# Patient Record
Sex: Female | Born: 1950 | Race: White | Hispanic: No | Marital: Married | Smoking: Former smoker
Health system: Southern US, Community
[De-identification: ages and names within clinical notes are randomized; demographics above are authoritative.]

## PROBLEM LIST (undated history)

## (undated) DIAGNOSIS — R6520 Severe sepsis without septic shock: Secondary | ICD-10-CM

## (undated) DIAGNOSIS — I5189 Other ill-defined heart diseases: Secondary | ICD-10-CM

## (undated) DIAGNOSIS — F32A Depression, unspecified: Secondary | ICD-10-CM

## (undated) DIAGNOSIS — Z860100 Personal history of colon polyps, unspecified: Secondary | ICD-10-CM

## (undated) DIAGNOSIS — L309 Dermatitis, unspecified: Secondary | ICD-10-CM

## (undated) DIAGNOSIS — K5720 Diverticulitis of large intestine with perforation and abscess without bleeding: Secondary | ICD-10-CM

## (undated) DIAGNOSIS — J9621 Acute and chronic respiratory failure with hypoxia: Secondary | ICD-10-CM

## (undated) DIAGNOSIS — E872 Acidosis, unspecified: Secondary | ICD-10-CM

## (undated) DIAGNOSIS — I2510 Atherosclerotic heart disease of native coronary artery without angina pectoris: Secondary | ICD-10-CM

## (undated) DIAGNOSIS — K5780 Diverticulitis of intestine, part unspecified, with perforation and abscess without bleeding: Secondary | ICD-10-CM

## (undated) DIAGNOSIS — J449 Chronic obstructive pulmonary disease, unspecified: Secondary | ICD-10-CM

## (undated) DIAGNOSIS — Z905 Acquired absence of kidney: Secondary | ICD-10-CM

## (undated) DIAGNOSIS — A419 Sepsis, unspecified organism: Secondary | ICD-10-CM

## (undated) DIAGNOSIS — J40 Bronchitis, not specified as acute or chronic: Secondary | ICD-10-CM

## (undated) DIAGNOSIS — I739 Peripheral vascular disease, unspecified: Secondary | ICD-10-CM

## (undated) DIAGNOSIS — Z889 Allergy status to unspecified drugs, medicaments and biological substances status: Secondary | ICD-10-CM

## (undated) DIAGNOSIS — I700 Atherosclerosis of aorta: Secondary | ICD-10-CM

## (undated) DIAGNOSIS — Z8619 Personal history of other infectious and parasitic diseases: Secondary | ICD-10-CM

## (undated) DIAGNOSIS — I471 Supraventricular tachycardia, unspecified: Secondary | ICD-10-CM

## (undated) DIAGNOSIS — Z720 Tobacco use: Secondary | ICD-10-CM

## (undated) DIAGNOSIS — I959 Hypotension, unspecified: Secondary | ICD-10-CM

## (undated) DIAGNOSIS — G43909 Migraine, unspecified, not intractable, without status migrainosus: Secondary | ICD-10-CM

## (undated) DIAGNOSIS — E785 Hyperlipidemia, unspecified: Secondary | ICD-10-CM

## (undated) DIAGNOSIS — R0609 Other forms of dyspnea: Secondary | ICD-10-CM

## (undated) DIAGNOSIS — Z9981 Dependence on supplemental oxygen: Secondary | ICD-10-CM

## (undated) DIAGNOSIS — I493 Ventricular premature depolarization: Secondary | ICD-10-CM

## (undated) DIAGNOSIS — F329 Major depressive disorder, single episode, unspecified: Secondary | ICD-10-CM

## (undated) HISTORY — PX: COLON SURGERY: SHX602

## (undated) HISTORY — PX: MASTECTOMY: SHX3

## (undated) HISTORY — PX: HERNIA REPAIR: SHX51

## (undated) HISTORY — PX: ABDOMINAL HYSTERECTOMY: SHX81

## (undated) HISTORY — PX: TONSILLECTOMY: SUR1361

## (undated) HISTORY — PX: TUBAL LIGATION: SHX77

---

## 1994-01-16 DIAGNOSIS — C50911 Malignant neoplasm of unspecified site of right female breast: Secondary | ICD-10-CM

## 1994-01-16 HISTORY — DX: Malignant neoplasm of unspecified site of right female breast: C50.911

## 1997-10-06 ENCOUNTER — Other Ambulatory Visit: Admission: RE | Admit: 1997-10-06 | Discharge: 1997-10-06 | Payer: Self-pay | Admitting: Gynecology

## 1997-12-03 ENCOUNTER — Other Ambulatory Visit: Admission: RE | Admit: 1997-12-03 | Discharge: 1997-12-03 | Payer: Self-pay | Admitting: Gynecology

## 1997-12-07 ENCOUNTER — Other Ambulatory Visit: Admission: RE | Admit: 1997-12-07 | Discharge: 1997-12-07 | Payer: Self-pay | Admitting: Gynecology

## 1998-05-06 ENCOUNTER — Encounter: Payer: Self-pay | Admitting: Gynecology

## 1998-05-12 ENCOUNTER — Inpatient Hospital Stay (HOSPITAL_COMMUNITY): Admission: RE | Admit: 1998-05-12 | Discharge: 1998-05-14 | Payer: Self-pay | Admitting: Gynecology

## 2004-04-11 ENCOUNTER — Ambulatory Visit: Payer: Self-pay

## 2004-06-28 ENCOUNTER — Ambulatory Visit: Payer: Self-pay | Admitting: Gastroenterology

## 2005-02-27 ENCOUNTER — Ambulatory Visit: Payer: Self-pay | Admitting: Gerontology

## 2005-05-02 ENCOUNTER — Ambulatory Visit: Payer: Self-pay | Admitting: Gerontology

## 2005-05-19 ENCOUNTER — Ambulatory Visit: Payer: Self-pay | Admitting: Internal Medicine

## 2005-06-28 ENCOUNTER — Ambulatory Visit: Payer: Self-pay

## 2006-10-08 ENCOUNTER — Ambulatory Visit: Payer: Self-pay | Admitting: Internal Medicine

## 2007-02-27 ENCOUNTER — Ambulatory Visit: Payer: Self-pay

## 2007-04-07 ENCOUNTER — Emergency Department: Payer: Self-pay | Admitting: Emergency Medicine

## 2007-04-12 ENCOUNTER — Emergency Department: Payer: Self-pay | Admitting: Emergency Medicine

## 2007-06-18 ENCOUNTER — Ambulatory Visit: Payer: Self-pay | Admitting: Unknown Physician Specialty

## 2007-10-10 ENCOUNTER — Ambulatory Visit: Payer: Self-pay | Admitting: Internal Medicine

## 2008-01-13 ENCOUNTER — Ambulatory Visit: Payer: Self-pay | Admitting: General Practice

## 2008-01-13 HISTORY — PX: SHOULDER ARTHROSCOPY WITH SUBACROMIAL DECOMPRESSION, ROTATOR CUFF REPAIR AND BICEP TENDON REPAIR: SHX5687

## 2010-06-01 ENCOUNTER — Emergency Department: Payer: Self-pay | Admitting: Emergency Medicine

## 2010-06-22 ENCOUNTER — Ambulatory Visit: Payer: Self-pay | Admitting: Internal Medicine

## 2010-10-14 ENCOUNTER — Emergency Department: Payer: Self-pay | Admitting: Emergency Medicine

## 2010-10-26 ENCOUNTER — Ambulatory Visit: Payer: Self-pay | Admitting: Gastroenterology

## 2010-10-28 LAB — PATHOLOGY REPORT

## 2011-11-01 ENCOUNTER — Ambulatory Visit: Payer: Self-pay | Admitting: Gastroenterology

## 2013-09-04 DIAGNOSIS — I493 Ventricular premature depolarization: Secondary | ICD-10-CM | POA: Insufficient documentation

## 2013-09-04 DIAGNOSIS — Z720 Tobacco use: Secondary | ICD-10-CM | POA: Insufficient documentation

## 2014-02-26 DIAGNOSIS — E7800 Pure hypercholesterolemia, unspecified: Secondary | ICD-10-CM | POA: Insufficient documentation

## 2014-03-04 ENCOUNTER — Ambulatory Visit: Payer: Self-pay | Admitting: Internal Medicine

## 2014-06-04 DIAGNOSIS — E782 Mixed hyperlipidemia: Secondary | ICD-10-CM | POA: Insufficient documentation

## 2014-12-16 DIAGNOSIS — R0602 Shortness of breath: Secondary | ICD-10-CM | POA: Insufficient documentation

## 2015-03-01 DIAGNOSIS — J410 Simple chronic bronchitis: Secondary | ICD-10-CM | POA: Insufficient documentation

## 2015-03-01 DIAGNOSIS — Z0000 Encounter for general adult medical examination without abnormal findings: Secondary | ICD-10-CM | POA: Insufficient documentation

## 2015-08-30 DIAGNOSIS — L309 Dermatitis, unspecified: Secondary | ICD-10-CM | POA: Insufficient documentation

## 2015-11-30 ENCOUNTER — Other Ambulatory Visit: Payer: Self-pay | Admitting: Gastroenterology

## 2015-11-30 DIAGNOSIS — R194 Change in bowel habit: Secondary | ICD-10-CM

## 2015-11-30 DIAGNOSIS — R1084 Generalized abdominal pain: Secondary | ICD-10-CM

## 2015-12-02 ENCOUNTER — Ambulatory Visit: Payer: Self-pay | Admitting: Urology

## 2015-12-06 ENCOUNTER — Ambulatory Visit
Admission: RE | Admit: 2015-12-06 | Discharge: 2015-12-06 | Disposition: A | Discharge status: Home / Self Care | Payer: BLUE CROSS/BLUE SHIELD | Source: Ambulatory Visit | Attending: Gastroenterology | Admitting: Gastroenterology

## 2015-12-06 DIAGNOSIS — R194 Change in bowel habit: Secondary | ICD-10-CM | POA: Insufficient documentation

## 2015-12-06 DIAGNOSIS — N281 Cyst of kidney, acquired: Secondary | ICD-10-CM | POA: Insufficient documentation

## 2015-12-06 DIAGNOSIS — R1084 Generalized abdominal pain: Secondary | ICD-10-CM | POA: Diagnosis present

## 2015-12-06 DIAGNOSIS — D1803 Hemangioma of intra-abdominal structures: Secondary | ICD-10-CM | POA: Diagnosis not present

## 2015-12-06 MED ORDER — IOPAMIDOL (ISOVUE-300) INJECTION 61%: 100.0000 mL | Freq: Once | INTRAVENOUS | Status: DC | PRN

## 2015-12-06 MED ORDER — IOPAMIDOL (ISOVUE-300) INJECTION 61%
85.0000 mL | Freq: Once | INTRAVENOUS | Status: AC | PRN
  Administered 2015-12-06: 85 mL via INTRAVENOUS

## 2015-12-08 ENCOUNTER — Ambulatory Visit: Payer: Self-pay | Admitting: Urology

## 2015-12-08 ENCOUNTER — Ambulatory Visit (INDEPENDENT_AMBULATORY_CARE_PROVIDER_SITE_OTHER): Payer: BLUE CROSS/BLUE SHIELD | Admitting: Urology

## 2015-12-08 ENCOUNTER — Encounter: Payer: Self-pay | Admitting: Urology

## 2015-12-08 VITALS — BP 127/81 | HR 116 | Ht 62.0 in | Wt 135.0 lb

## 2015-12-08 DIAGNOSIS — N3941 Urge incontinence: Secondary | ICD-10-CM

## 2015-12-08 DIAGNOSIS — Q600 Renal agenesis, unilateral: Secondary | ICD-10-CM

## 2015-12-08 DIAGNOSIS — IMO0002 Reserved for concepts with insufficient information to code with codable children: Secondary | ICD-10-CM

## 2015-12-08 DIAGNOSIS — N8111 Cystocele, midline: Secondary | ICD-10-CM

## 2015-12-08 DIAGNOSIS — N390 Urinary tract infection, site not specified: Secondary | ICD-10-CM | POA: Diagnosis not present

## 2015-12-08 LAB — URINALYSIS, COMPLETE
BILIRUBIN UA: NEGATIVE
Glucose, UA: NEGATIVE
Ketones, UA: NEGATIVE
Leukocytes, UA: NEGATIVE
NITRITE UA: NEGATIVE
PH UA: 7 (ref 5.0–7.5)
Protein, UA: NEGATIVE
Specific Gravity, UA: 1.01 (ref 1.005–1.030)
UUROB: 0.2 mg/dL (ref 0.2–1.0)

## 2015-12-08 LAB — MICROSCOPIC EXAMINATION: BACTERIA UA: NONE SEEN

## 2015-12-08 NOTE — Progress Notes (Signed)
12/08/2015 11:34 AM   Brianna Briggs December 11, 1950 [id number]  Referring provider: Kirk Ruths, MD [facility] [name], [name]  Chief Complaint  Patient presents with  . New Patient (Initial Visit)    recurrent UTI     HPI: The patient is a 65 year old female who presents for workup of abdominal pain and UTI. Her cultures grew with a Serratia UTI on October 5 and 24, 2017. She has no other cultures in the system. She had only this urinary tract infection episode in October 2017. She has not had a urinary tract episode many years prior to this. She did have severe episode when she was in college that time was found to have a solitary left kidney.  She does note urinary urgency with urge incontinence. She denies incontinence with coughing or sneezing. She feels that she empties her bladder has a good stream. She does note that she fills her bladder is falling out. She is not very bothered by this. Her PVR today is 0. Urinalysis is clean.  Of note, the patient underwent a CT recently for this abdominal pain. That time has noted that she had a bag of water for a right kidney and a scarred left kidney. He had no obvious stones or masses.  Creatinine was 0.7 in November 2017.   PMH: History reviewed. No pertinent past medical history.  Surgical History: Past Surgical History:  Procedure Laterality Date  . ABDOMINAL HYSTERECTOMY    . MASTECTOMY    . TONSILLECTOMY    . TUBAL LIGATION      Home Medications:    Medication List    as of 12/08/2015 11:34 AM   You have not been prescribed any medications.     Allergies:  Allergies  Allergen Reactions  . Soy Protein  [Soybean Oil] Rash  . Wheat Bran Rash  . Amoxicillin-Pot Clavulanate Diarrhea    Other reaction(s): Unknown  . Biaxin [Clarithromycin] Diarrhea    Other reaction(s): Unknown  . Oxycodone Itching  . Codeine Rash  . Nitrofurantoin Rash  . Soy Allergy Rash     Family History: Family History  Problem Relation Age of Onset  . Cancer Father   . Kidney failure Mother     Social History:  reports that she has been smoking Cigarettes.  She has a 60.00 pack-year smoking history. She has never used smokeless tobacco. She reports that she does not drink alcohol or use drugs.  ROS:                                        Physical Exam: BP 127/81   Pulse (!) 116   Ht 5\' 2"  (1.575 m)   Wt 135 lb (61.2 kg)   BMI 24.69 kg/m   Constitutional:  Alert and oriented, No acute distress. HEENT: Regino Ramirez AT, moist mucus membranes.  Trachea midline, no masses. Cardiovascular: No clubbing, cyanosis, or edema. Respiratory: Normal respiratory effort, no increased work of breathing. GI: Abdomen is soft, nontender, nondistended, no abdominal masses GU: No CVA tenderness. Cystocele passed level of hymen. No rectocele. No SUI. Skin: No rashes, bruises or suspicious lesions. Lymph: No cervical or inguinal adenopathy. Neurologic: Grossly intact, no focal deficits, moving all 4 extremities. Psychiatric: Normal mood and affect.  Laboratory Data: No results found for: WBC, HGB, HCT, MCV, PLT  No results found for: CREATININE  No results found for: PSA  No results found for: TESTOSTERONE  No results found for: HGBA1C  Urinalysis No results found for: COLORURINE, APPEARANCEUR, LABSPEC, PHURINE, GLUCOSEU, HGBUR, BILIRUBINUR, KETONESUR, PROTEINUR, UROBILINOGEN, NITRITE, LEUKOCYTESUR  Imaging: CLINICAL DATA:  Left-sided abdominal pain for several months, no known injury, initial encounter  EXAM: CT ABDOMEN AND PELVIS WITH CONTRAST  TECHNIQUE: Multidetector CT imaging of the abdomen and pelvis was performed using the standard protocol following bolus administration of intravenous contrast.  CONTRAST:  22mL ISOVUE-300 IOPAMIDOL (ISOVUE-300) INJECTION 61%  COMPARISON:  None.  FINDINGS: Lower chest: No acute  abnormality.  Hepatobiliary: Gallbladder is within normal limits. No biliary ductal dilatation is seen. In the medial aspect of the right lobe adjacent to the IVC on image number 14 of series to there is a 1.9 cm per nodular peripherally enhancing lesion identified which demonstrates isointensity on delayed images consistent with a hemangioma. No other focal hepatic abnormality is noted.  Pancreas: Unremarkable. No pancreatic ductal dilatation or surrounding inflammatory changes.  Spleen: Normal in size without focal abnormality.  Adrenals/Urinary Tract: Adrenal glands are within normal limits. The native right kidney is replaced with cysts. This is stable from the prior exam. The left kidney demonstrates some scarring as well as a few small cysts. No obstructive changes are seen. The bladder is decompressed.  Stomach/Bowel: Diverticular changes noted within the descending colon and sigmoid colon with wall thickening although no significant pericolonic inflammatory changes are seen. No abscess or perforation is identified. The appendix is within normal limits. No obstructive changes are seen.  Vascular/Lymphatic: Aortic atherosclerosis. No enlarged abdominal or pelvic lymph nodes.  Reproductive: Status post hysterectomy. No adnexal masses.  Other: No abdominal wall hernia or abnormality. No abdominopelvic ascites.  Musculoskeletal: No acute or significant osseous findings.  IMPRESSION: Hepatic hemangioma.  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  Scarring and cystic change in the left kidney  Changes of diverticular disease with wall thickening although no definitive pericolonic inflammatory changes are noted to suggest acute diverticulitis.  Assessment & Plan:    1. Acute UTI Since the patient grew Serratia on both cultures, I feel that she did not clear her urinary tract infection and consider this to be just one urinary tract infection. No  further workup is needed. She will follow-up with [place] if she develops signs or symptoms of another UTI.  2. Solitary left kidney No further workup needed. Her kidney function is good at this point.  3. Cystocele Patient is not interested in surgical correction at this time.  4. Urinary urgency with urge incontinence Patient is not interested in medication at this time.  Return if symptoms worsen or fail to improve.  Nickie Retort, MD  [facility] [address] [place], [place] [address] [phone]

## 2018-07-26 ENCOUNTER — Other Ambulatory Visit (HOSPITAL_COMMUNITY): Payer: Self-pay | Admitting: Physician Assistant

## 2018-07-26 ENCOUNTER — Other Ambulatory Visit: Payer: Self-pay | Admitting: Physician Assistant

## 2018-07-26 ENCOUNTER — Telehealth: Payer: Self-pay | Admitting: *Deleted

## 2018-07-26 DIAGNOSIS — Z87891 Personal history of nicotine dependence: Secondary | ICD-10-CM

## 2018-07-26 DIAGNOSIS — Z122 Encounter for screening for malignant neoplasm of respiratory organs: Secondary | ICD-10-CM

## 2018-07-26 DIAGNOSIS — R1902 Left upper quadrant abdominal swelling, mass and lump: Secondary | ICD-10-CM

## 2018-07-26 NOTE — Telephone Encounter (Signed)
Received referral for initial lung cancer screening scan. Contacted patient and obtained smoking history,(current, 60 pack year) as well as answering questions related to screening process. Patient denies signs of lung cancer such as weight loss or hemoptysis. Patient denies comorbidity that would prevent curative treatment if lung cancer were found. Patient is scheduled for shared decision making visit and CT scan on 07/30/18 at 145pm.

## 2018-07-30 ENCOUNTER — Inpatient Hospital Stay: Payer: Medicare Other | Admitting: Nurse Practitioner

## 2018-07-30 ENCOUNTER — Ambulatory Visit: Admission: RE | Admit: 2018-07-30 | Payer: Medicare Other | Source: Ambulatory Visit

## 2018-08-05 ENCOUNTER — Ambulatory Visit
Admission: RE | Admit: 2018-08-05 | Discharge: 2018-08-05 | Disposition: A | Discharge status: Home / Self Care | Payer: Medicare Other | Source: Ambulatory Visit | Attending: Physician Assistant | Admitting: Physician Assistant

## 2018-08-05 ENCOUNTER — Other Ambulatory Visit: Payer: Self-pay

## 2018-08-05 DIAGNOSIS — R1902 Left upper quadrant abdominal swelling, mass and lump: Secondary | ICD-10-CM | POA: Diagnosis not present

## 2018-08-06 ENCOUNTER — Ambulatory Visit
Admission: RE | Admit: 2018-08-06 | Discharge: 2018-08-06 | Disposition: A | Discharge status: Home / Self Care | Payer: Medicare Other | Source: Ambulatory Visit | Attending: Nurse Practitioner | Admitting: Nurse Practitioner

## 2018-08-06 ENCOUNTER — Other Ambulatory Visit: Payer: Self-pay

## 2018-08-06 ENCOUNTER — Inpatient Hospital Stay: Payer: Medicare Other | Attending: Nurse Practitioner | Admitting: Nurse Practitioner

## 2018-08-06 DIAGNOSIS — Z87891 Personal history of nicotine dependence: Secondary | ICD-10-CM

## 2018-08-06 DIAGNOSIS — Z122 Encounter for screening for malignant neoplasm of respiratory organs: Secondary | ICD-10-CM

## 2018-08-06 NOTE — Progress Notes (Signed)
Virtual Visit via Video Enabled Telemedicine Note   I connected with Brianna Briggs on 08/06/18 at 1:15 PM EST by video enabled telemedicine visit and verified that I am speaking with the correct person using two identifiers.   I discussed the limitations, risks, security and privacy concerns of performing an evaluation and management service by telemedicine and the availability of in-person appointments. I also discussed with the patient that there may be a patient responsible charge related to this service. The patient expressed understanding and agreed to proceed.   Other persons participating in the visit and their role in the encounter: Burgess Estelle, RN- checking in patient & navigation  Patient's location: clinic  Provider's location: home  Chief Complaint: Low Dose CT Screening  Patient agreed to evaluation by telemedicine to discuss shared decision making for consideration of low dose CT lung cancer screening.    In accordance with CMS guidelines, patient has met eligibility criteria including age, absence of signs or symptoms of lung cancer.  Social History   Tobacco Use  . Smoking status: Current Every Day Smoker    Packs/day: 1.50    Years: 40.00    Pack years: 60.00    Types: Cigarettes  . Smokeless tobacco: Never Used  Substance Use Topics  . Alcohol use: No     A shared decision-making session was conducted prior to the performance of CT scan. This includes one or more decision aids, includes benefits and harms of screening, follow-up diagnostic testing, over-diagnosis, false positive rate, and total radiation exposure.   Counseling on the importance of adherence to annual lung cancer LDCT screening, impact of co-morbidities, and ability or willingness to undergo diagnosis and treatment is imperative for compliance of the program.   Counseling on the importance of continued smoking cessation for former smokers; the importance of smoking cessation for current  smokers, and information about tobacco cessation interventions have been given to patient including [place] and [employer] [place] programs.   Written order for lung cancer screening with LDCT has been given to the patient and any and all questions have been answered to the best of my abilities.    Yearly follow up will be coordinated by Burgess Estelle, Thoracic Navigator.  I discussed the assessment and treatment plan with the patient. The patient was provided an opportunity to ask questions and all were answered. The patient agreed with the plan and demonstrated an understanding of the instructions.   The patient was advised to call back or seek an in-person evaluation if the symptoms worsen or if the condition fails to improve as anticipated.   I provided 15 minutes of face-to-face video visit time during this encounter, and > 50% was spent counseling as documented under my assessment & plan.   Beckey Rutter, DNP, AGNP-C [place] [facility] [phone] (work cell) [phone] (office)

## 2018-08-08 ENCOUNTER — Telehealth: Payer: Self-pay | Admitting: *Deleted

## 2018-08-08 NOTE — Telephone Encounter (Signed)
Pt called to get results from recent LDCT. Results and recommendations reviewed with the patient. Pt asked questions regarding smoking cessation program. All questions answered and encouraged patient to contact the program to get registered for the smoking cessation program. Pt verbalized understanding.  Results faxed to Dr. Tonette Bihari office since patient has a scheduled follow up visit with him today.   Nothing further needed at this time.

## 2018-08-29 ENCOUNTER — Other Ambulatory Visit: Payer: Self-pay | Admitting: Gastroenterology

## 2018-08-29 DIAGNOSIS — R6881 Early satiety: Secondary | ICD-10-CM

## 2018-08-29 DIAGNOSIS — R1013 Epigastric pain: Secondary | ICD-10-CM

## 2018-08-29 DIAGNOSIS — R140 Abdominal distension (gaseous): Secondary | ICD-10-CM

## 2018-09-12 ENCOUNTER — Ambulatory Visit: Payer: BLUE CROSS/BLUE SHIELD

## 2018-09-16 ENCOUNTER — Other Ambulatory Visit: Payer: Self-pay

## 2018-09-16 ENCOUNTER — Ambulatory Visit
Admission: RE | Admit: 2018-09-16 | Discharge: 2018-09-16 | Disposition: A | Discharge status: Home / Self Care | Payer: Medicare Other | Source: Ambulatory Visit | Attending: Gastroenterology | Admitting: Gastroenterology

## 2018-09-16 DIAGNOSIS — R1013 Epigastric pain: Secondary | ICD-10-CM | POA: Insufficient documentation

## 2018-09-16 DIAGNOSIS — R6881 Early satiety: Secondary | ICD-10-CM | POA: Diagnosis present

## 2018-09-16 DIAGNOSIS — R140 Abdominal distension (gaseous): Secondary | ICD-10-CM | POA: Diagnosis present

## 2018-09-16 MED ORDER — TECHNETIUM TC 99M SULFUR COLLOID
2.5600 | Freq: Once | INTRAVENOUS | Status: AC | PRN
  Administered 2018-09-16: 2.56 via INTRAVENOUS

## 2018-10-11 ENCOUNTER — Other Ambulatory Visit: Admission: RE | Admit: 2018-10-11 | Payer: BLUE CROSS/BLUE SHIELD | Source: Ambulatory Visit

## 2018-10-15 ENCOUNTER — Encounter: Admission: RE | Payer: Self-pay | Source: Home / Self Care

## 2018-10-15 ENCOUNTER — Ambulatory Visit: Admission: RE | Admit: 2018-10-15 | Payer: Medicare Other | Source: Home / Self Care | Admitting: Gastroenterology

## 2018-10-15 SURGERY — ESOPHAGOGASTRODUODENOSCOPY (EGD) WITH PROPOFOL
Anesthesia: General

## 2019-01-07 ENCOUNTER — Other Ambulatory Visit: Payer: Self-pay | Admitting: Student

## 2019-01-07 DIAGNOSIS — R1032 Left lower quadrant pain: Secondary | ICD-10-CM

## 2019-01-07 DIAGNOSIS — R194 Change in bowel habit: Secondary | ICD-10-CM

## 2019-01-07 DIAGNOSIS — R159 Full incontinence of feces: Secondary | ICD-10-CM

## 2019-01-08 ENCOUNTER — Ambulatory Visit
Admission: RE | Admit: 2019-01-08 | Discharge: 2019-01-08 | Disposition: A | Discharge status: Home / Self Care | Payer: Medicare Other | Source: Ambulatory Visit | Attending: Student | Admitting: Student

## 2019-01-08 ENCOUNTER — Other Ambulatory Visit: Payer: Self-pay

## 2019-01-08 DIAGNOSIS — R194 Change in bowel habit: Secondary | ICD-10-CM | POA: Diagnosis present

## 2019-01-08 DIAGNOSIS — R1032 Left lower quadrant pain: Secondary | ICD-10-CM | POA: Insufficient documentation

## 2019-01-08 DIAGNOSIS — R159 Full incontinence of feces: Secondary | ICD-10-CM | POA: Diagnosis present

## 2019-01-08 MED ORDER — IOHEXOL 300 MG/ML  SOLN
85.0000 mL | Freq: Once | INTRAMUSCULAR | Status: AC | PRN
  Administered 2019-01-08: 08:00:00 85 mL via INTRAVENOUS

## 2019-01-21 ENCOUNTER — Other Ambulatory Visit: Payer: Self-pay | Admitting: Student

## 2019-01-21 DIAGNOSIS — N289 Disorder of kidney and ureter, unspecified: Secondary | ICD-10-CM

## 2019-01-21 DIAGNOSIS — N281 Cyst of kidney, acquired: Secondary | ICD-10-CM

## 2019-01-21 DIAGNOSIS — K760 Fatty (change of) liver, not elsewhere classified: Secondary | ICD-10-CM

## 2019-01-21 DIAGNOSIS — K5792 Diverticulitis of intestine, part unspecified, without perforation or abscess without bleeding: Secondary | ICD-10-CM

## 2019-01-27 ENCOUNTER — Other Ambulatory Visit
Admission: RE | Admit: 2019-01-27 | Discharge: 2019-01-27 | Disposition: A | Discharge status: Home / Self Care | Payer: Medicare Other | Source: Ambulatory Visit | Attending: Gastroenterology | Admitting: Gastroenterology

## 2019-01-27 DIAGNOSIS — C4492 Squamous cell carcinoma of skin, unspecified: Secondary | ICD-10-CM

## 2019-01-27 DIAGNOSIS — R197 Diarrhea, unspecified: Secondary | ICD-10-CM | POA: Diagnosis present

## 2019-01-27 HISTORY — DX: Squamous cell carcinoma of skin, unspecified: C44.92

## 2019-01-27 LAB — C DIFFICILE QUICK SCREEN W PCR REFLEX
C Diff antigen: NEGATIVE
C Diff interpretation: NOT DETECTED
C Diff toxin: NEGATIVE

## 2019-01-29 LAB — CALPROTECTIN, FECAL: Calprotectin, Fecal: 42 ug/g (ref 0–120)

## 2019-01-30 LAB — O&P RESULT

## 2019-01-30 LAB — GIARDIA, EIA; OVA/PARASITE: Giardia Ag, Stl: NEGATIVE

## 2019-01-31 LAB — STOOL CULTURE: E coli, Shiga toxin Assay: NEGATIVE

## 2019-01-31 LAB — STOOL CULTURE REFLEX - RSASHR

## 2019-01-31 LAB — STOOL CULTURE REFLEX - CMPCXR

## 2019-02-03 ENCOUNTER — Other Ambulatory Visit: Payer: Self-pay

## 2019-02-03 ENCOUNTER — Ambulatory Visit
Admission: RE | Admit: 2019-02-03 | Discharge: 2019-02-03 | Disposition: A | Discharge status: Home / Self Care | Payer: Medicare Other | Source: Ambulatory Visit | Attending: Student | Admitting: Student

## 2019-02-03 DIAGNOSIS — K760 Fatty (change of) liver, not elsewhere classified: Secondary | ICD-10-CM | POA: Insufficient documentation

## 2019-02-03 DIAGNOSIS — N289 Disorder of kidney and ureter, unspecified: Secondary | ICD-10-CM | POA: Diagnosis present

## 2019-02-03 DIAGNOSIS — N281 Cyst of kidney, acquired: Secondary | ICD-10-CM | POA: Insufficient documentation

## 2019-02-03 DIAGNOSIS — K5792 Diverticulitis of intestine, part unspecified, without perforation or abscess without bleeding: Secondary | ICD-10-CM | POA: Diagnosis present

## 2019-02-03 MED ORDER — GADOBUTROL 1 MMOL/ML IV SOLN
6.0000 mL | Freq: Once | INTRAVENOUS | Status: AC | PRN
  Administered 2019-02-03: 6 mL via INTRAVENOUS

## 2019-04-22 ENCOUNTER — Ambulatory Visit (INDEPENDENT_AMBULATORY_CARE_PROVIDER_SITE_OTHER): Payer: Medicare Other | Admitting: Dermatology

## 2019-04-22 ENCOUNTER — Other Ambulatory Visit: Payer: Self-pay

## 2019-04-22 ENCOUNTER — Encounter: Payer: Self-pay | Admitting: Dermatology

## 2019-04-22 DIAGNOSIS — L309 Dermatitis, unspecified: Secondary | ICD-10-CM

## 2019-04-22 DIAGNOSIS — Z86007 Personal history of in-situ neoplasm of skin: Secondary | ICD-10-CM

## 2019-04-22 DIAGNOSIS — L209 Atopic dermatitis, unspecified: Secondary | ICD-10-CM

## 2019-04-22 DIAGNOSIS — L820 Inflamed seborrheic keratosis: Secondary | ICD-10-CM

## 2019-04-22 MED ORDER — MOMETASONE FUROATE 0.1 % EX CREA: 1.0000 "application " | TOPICAL_CREAM | Freq: Every day | CUTANEOUS | 2 refills | Status: DC | PRN

## 2019-04-22 MED ORDER — EUCRISA 2 % EX OINT: 1.0000 "application " | TOPICAL_OINTMENT | Freq: Two times a day (BID) | CUTANEOUS | 2 refills | Status: DC

## 2019-04-22 NOTE — Progress Notes (Signed)
   Follow-Up Visit   Subjective  Brianna Briggs is a 69 y.o. female who presents for the following: Eczema (face, neck, hands and arms. Getting worse over the last few weeks. She has Betamethasone but has not used it on her face and that is where her rash is worse.). The patient has severe atopic dermatitis that is poorly controlled and has flared recently and now involves her face. She has been treated with topical medications but I do not feel this is been adequate for her.  She has been offered Dupixent treatment in the past and has declined it. 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  She would like this checked.  She also has a spot on her right cheek that is irritating for which she would like treatment.   The following portions of the chart were reviewed this encounter and updated as appropriate:     Review of Systems: No other skin or systemic complaints.  Objective  Well appearing patient in no apparent distress; mood and affect are within normal limits.  A focused examination was performed including face, neck, hands, arms. Relevant physical exam findings are noted in the Assessment and Plan.  Objective  Face, hands and arms: Patchy erythema and scale.  Objective  Right inf lat thigh: Well healed scar with no evidence of recurrence, no lymphadenopathy.   Objective  Left Cheek: Erythematous keratotic or waxy stuck-on papule or plaque.   Assessment & Plan  Eczema, atopic dermatitis, severe and recently flared. Face, hands and arms  Recommend Cerave cream daily.  Discussed Dupixent again today. Patient declines today.  mometasone (ELOCON) 0.1 % cream - Face, hands and arms  Crisaborole (EUCRISA) 2 % OINT - Face, hands and arms I would like to avoid strong topical corticosteroids to prevent atrophy in this patient severe chronic condition.   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 Observe   Inflamed seborrheic keratosis Left Cheek  Destruction of lesion - Left Cheek Complexity: simple   Destruction method: cryotherapy   Informed consent: discussed and consent obtained   Timeout:  patient name, date of birth, surgical site, and procedure verified Lesion destroyed using liquid nitrogen: Yes   Region frozen until ice ball extended beyond lesion: Yes   Outcome: patient tolerated procedure well with no complications   Post-procedure details: wound care instructions given    Return in about 3 months (around 07/22/2019) for as scheduled for [facility]SE.   I, Ashok Cordia, CMA, am acting as scribe for Sarina Ser, MD .

## 2019-04-22 NOTE — Patient Instructions (Signed)

## 2019-04-29 ENCOUNTER — Other Ambulatory Visit
Admission: RE | Admit: 2019-04-29 | Discharge: 2019-04-29 | Disposition: A | Discharge status: Home / Self Care | Payer: Medicare Other | Source: Ambulatory Visit | Attending: Internal Medicine | Admitting: Internal Medicine

## 2019-04-29 DIAGNOSIS — Z01812 Encounter for preprocedural laboratory examination: Secondary | ICD-10-CM | POA: Insufficient documentation

## 2019-04-29 DIAGNOSIS — Z20822 Contact with and (suspected) exposure to covid-19: Secondary | ICD-10-CM | POA: Insufficient documentation

## 2019-04-29 LAB — SARS CORONAVIRUS 2 (TAT 6-24 HRS): SARS Coronavirus 2: NEGATIVE

## 2019-04-30 ENCOUNTER — Encounter: Payer: Self-pay | Admitting: Internal Medicine

## 2019-05-01 ENCOUNTER — Ambulatory Visit: Payer: Medicare Other | Admitting: Anesthesiology

## 2019-05-01 ENCOUNTER — Encounter: Payer: Self-pay | Admitting: Internal Medicine

## 2019-05-01 ENCOUNTER — Ambulatory Visit
Admission: RE | Admit: 2019-05-01 | Discharge: 2019-05-01 | Disposition: A | Discharge status: Home / Self Care | Payer: Medicare Other | Attending: Internal Medicine | Admitting: Internal Medicine

## 2019-05-01 ENCOUNTER — Other Ambulatory Visit: Payer: Self-pay

## 2019-05-01 ENCOUNTER — Encounter
Admission: RE | Disposition: A | Discharge status: Home / Self Care | Payer: Self-pay | Source: Home / Self Care | Attending: Internal Medicine

## 2019-05-01 DIAGNOSIS — Z9011 Acquired absence of right breast and nipple: Secondary | ICD-10-CM | POA: Insufficient documentation

## 2019-05-01 DIAGNOSIS — D123 Benign neoplasm of transverse colon: Secondary | ICD-10-CM | POA: Diagnosis not present

## 2019-05-01 DIAGNOSIS — Z8719 Personal history of other diseases of the digestive system: Secondary | ICD-10-CM | POA: Diagnosis not present

## 2019-05-01 DIAGNOSIS — E785 Hyperlipidemia, unspecified: Secondary | ICD-10-CM | POA: Insufficient documentation

## 2019-05-01 DIAGNOSIS — Z885 Allergy status to narcotic agent status: Secondary | ICD-10-CM | POA: Insufficient documentation

## 2019-05-01 DIAGNOSIS — K228 Other specified diseases of esophagus: Secondary | ICD-10-CM | POA: Insufficient documentation

## 2019-05-01 DIAGNOSIS — Z880 Allergy status to penicillin: Secondary | ICD-10-CM | POA: Insufficient documentation

## 2019-05-01 DIAGNOSIS — D122 Benign neoplasm of ascending colon: Secondary | ICD-10-CM | POA: Diagnosis not present

## 2019-05-01 DIAGNOSIS — Z881 Allergy status to other antibiotic agents status: Secondary | ICD-10-CM | POA: Insufficient documentation

## 2019-05-01 DIAGNOSIS — K529 Noninfective gastroenteritis and colitis, unspecified: Secondary | ICD-10-CM | POA: Insufficient documentation

## 2019-05-01 DIAGNOSIS — Z853 Personal history of malignant neoplasm of breast: Secondary | ICD-10-CM | POA: Insufficient documentation

## 2019-05-01 DIAGNOSIS — K6389 Other specified diseases of intestine: Secondary | ICD-10-CM | POA: Insufficient documentation

## 2019-05-01 DIAGNOSIS — K6289 Other specified diseases of anus and rectum: Secondary | ICD-10-CM | POA: Diagnosis not present

## 2019-05-01 DIAGNOSIS — D120 Benign neoplasm of cecum: Secondary | ICD-10-CM | POA: Diagnosis not present

## 2019-05-01 DIAGNOSIS — F17200 Nicotine dependence, unspecified, uncomplicated: Secondary | ICD-10-CM | POA: Diagnosis not present

## 2019-05-01 DIAGNOSIS — R1032 Left lower quadrant pain: Secondary | ICD-10-CM | POA: Insufficient documentation

## 2019-05-01 DIAGNOSIS — K2100 Gastro-esophageal reflux disease with esophagitis, without bleeding: Secondary | ICD-10-CM | POA: Insufficient documentation

## 2019-05-01 DIAGNOSIS — M1990 Unspecified osteoarthritis, unspecified site: Secondary | ICD-10-CM | POA: Diagnosis not present

## 2019-05-01 DIAGNOSIS — Z79899 Other long term (current) drug therapy: Secondary | ICD-10-CM | POA: Diagnosis not present

## 2019-05-01 DIAGNOSIS — K640 First degree hemorrhoids: Secondary | ICD-10-CM | POA: Diagnosis not present

## 2019-05-01 DIAGNOSIS — K621 Rectal polyp: Secondary | ICD-10-CM | POA: Diagnosis not present

## 2019-05-01 DIAGNOSIS — K317 Polyp of stomach and duodenum: Secondary | ICD-10-CM | POA: Insufficient documentation

## 2019-05-01 DIAGNOSIS — R6881 Early satiety: Secondary | ICD-10-CM | POA: Insufficient documentation

## 2019-05-01 DIAGNOSIS — K5730 Diverticulosis of large intestine without perforation or abscess without bleeding: Secondary | ICD-10-CM | POA: Insufficient documentation

## 2019-05-01 DIAGNOSIS — R1013 Epigastric pain: Secondary | ICD-10-CM | POA: Diagnosis present

## 2019-05-01 HISTORY — DX: Personal history of other infectious and parasitic diseases: Z86.19

## 2019-05-01 HISTORY — PX: ESOPHAGOGASTRODUODENOSCOPY (EGD) WITH PROPOFOL: SHX5813

## 2019-05-01 HISTORY — DX: Personal history of colon polyps, unspecified: Z86.0100

## 2019-05-01 HISTORY — DX: Personal history of colonic polyps: Z86.010

## 2019-05-01 HISTORY — PX: COLONOSCOPY WITH PROPOFOL: SHX5780

## 2019-05-01 HISTORY — DX: Acquired absence of kidney: Z90.5

## 2019-05-01 HISTORY — DX: Allergy status to unspecified drugs, medicaments and biological substances: Z88.9

## 2019-05-01 HISTORY — DX: Hyperlipidemia, unspecified: E78.5

## 2019-05-01 SURGERY — COLONOSCOPY WITH PROPOFOL
Anesthesia: General

## 2019-05-01 MED ORDER — IPRATROPIUM-ALBUTEROL 0.5-2.5 (3) MG/3ML IN SOLN
RESPIRATORY_TRACT | Status: AC
  Filled 2019-05-01: qty 3

## 2019-05-01 MED ORDER — PROPOFOL 500 MG/50ML IV EMUL
INTRAVENOUS | Status: DC | PRN
  Administered 2019-05-01: 150 ug/kg/min via INTRAVENOUS

## 2019-05-01 MED ORDER — GLYCOPYRROLATE 0.2 MG/ML IJ SOLN
INTRAMUSCULAR | Status: AC
  Filled 2019-05-01: qty 1

## 2019-05-01 MED ORDER — HYDROCORTISONE NA SUCCINATE PF 100 MG IJ SOLR
INTRAMUSCULAR | Status: DC | PRN
  Administered 2019-05-01: 100 mg via INTRAVENOUS

## 2019-05-01 MED ORDER — PROPOFOL 500 MG/50ML IV EMUL
INTRAVENOUS | Status: AC
  Filled 2019-05-01: qty 50

## 2019-05-01 MED ORDER — FENTANYL CITRATE (PF) 100 MCG/2ML IJ SOLN: 25.0000 ug | INTRAMUSCULAR | Status: DC | PRN

## 2019-05-01 MED ORDER — IPRATROPIUM-ALBUTEROL 0.5-2.5 (3) MG/3ML IN SOLN
3.0000 mL | Freq: Once | RESPIRATORY_TRACT | Status: AC
  Administered 2019-05-01: 11:00:00 3 mL via RESPIRATORY_TRACT

## 2019-05-01 MED ORDER — PROPOFOL 10 MG/ML IV BOLUS
INTRAVENOUS | Status: DC | PRN
  Administered 2019-05-01 (×2): 10 mg via INTRAVENOUS
  Administered 2019-05-01: 80 mg via INTRAVENOUS
  Administered 2019-05-01: 10 mg via INTRAVENOUS

## 2019-05-01 MED ORDER — SODIUM CHLORIDE 0.9 % IV SOLN: INTRAVENOUS | Status: DC

## 2019-05-01 MED ORDER — MIDAZOLAM HCL 2 MG/2ML IJ SOLN
INTRAMUSCULAR | Status: AC
  Filled 2019-05-01: qty 2

## 2019-05-01 MED ORDER — LIDOCAINE HCL (CARDIAC) PF 100 MG/5ML IV SOSY
PREFILLED_SYRINGE | INTRAVENOUS | Status: DC | PRN
  Administered 2019-05-01: 40 mg via INTRAVENOUS

## 2019-05-01 MED ORDER — ONDANSETRON HCL 4 MG/2ML IJ SOLN: 4.0000 mg | Freq: Once | INTRAMUSCULAR | Status: DC | PRN

## 2019-05-01 NOTE — Op Note (Signed)
[facility] Patient Name: Brianna Briggs Procedure Date: 05/01/2019 10:16 AM MRN: [id number] Account #: [id number] Date of Birth: 02-Feb-1950 Admit Type: Outpatient Age: 69 Room: [facility] ENDO ROOM 3 Gender: Female Note Status: Finalized Procedure:             Colonoscopy Indications:           Diarrhea (secondary to noninfectious colitis),                         Diarrhea (presumed secondary to inflammatory bowel                         disease) Providers:             Benay Pike. Alice Reichert MD, MD Referring MD:          Ocie Cornfield. Ouida Sills MD, MD (Referring MD) Medicines:             Propofol per Anesthesia Complications:         No immediate complications. Estimated blood loss:                         Minimal. Procedure:             Pre-Anesthesia Assessment:                        - The risks and benefits of the procedure and the                         sedation options and risks were discussed with the                         patient. All questions were answered and informed                         consent was obtained.                        - Patient identification and proposed procedure were                         verified prior to the procedure by the nurse. The                         procedure was verified in the procedure room.                        - ASA Grade Assessment: III - A patient with severe                         systemic disease.                        - After reviewing the risks and benefits, the patient                         was deemed in satisfactory condition to undergo the                         procedure.  After obtaining informed consent, the colonoscope was                         passed under direct vision. Throughout the procedure,                         the patient's blood pressure, pulse, and oxygen                         saturations were monitored continuously. The                          Colonoscope was introduced through the anus and                         advanced to the the cecum, identified by appendiceal                         orifice and ileocecal valve. The colonoscopy was                         performed without difficulty. The patient tolerated                         the procedure well. The quality of the bowel                         preparation was adequate. The ileocecal valve,                         appendiceal orifice, and rectum were photographed. Findings:      The perianal and digital rectal examinations were normal. Pertinent       negatives include normal sphincter tone and no palpable rectal lesions.      Many small-mouthed diverticula were found in the sigmoid colon.      A 5 mm polyp was found in the cecum. The polyp was sessile. The polyp       was removed with a jumbo cold forceps. Resection and retrieval were       complete.      A 5 mm polyp was found in the proximal ascending colon. The polyp was       sessile. The polyp was removed with a jumbo cold forceps. Resection and       retrieval were complete.      A 8 mm polyp was found in the hepatic flexure. The polyp was       semi-pedunculated. The polyp was removed with a hot snare. Resection and       retrieval were complete.      A 7 mm polyp was found in the rectum. The polyp was semi-pedunculated.       The polyp was removed with a hot snare. Resection and retrieval were       complete.      Normal mucosa was found in the right colon. Biopsies for histology were       taken with a cold forceps from the ascending colon for evaluation of       microscopic colitis.      Normal mucosa was found in the left colon. Biopsies for histology were  taken with a cold forceps from the descending colon for evaluation of       microscopic colitis.      A segmental area of mildly friable mucosa with no bleeding was found in       the distal sigmoid colon. Biopsies were taken with a cold forceps  for       histology.      Segmental mild inflammation characterized by erythema was found in the       rectum. Biopsies were taken with a cold forceps for histology.      The exam was otherwise without abnormality.      Non-bleeding internal hemorrhoids were found during retroflexion. The       hemorrhoids were Grade I (internal hemorrhoids that do not prolapse). Impression:            - Diverticulosis in the sigmoid colon.                        - One 5 mm polyp in the cecum, removed with a jumbo                         cold forceps. Resected and retrieved.                        - One 5 mm polyp in the proximal ascending colon,                         removed with a jumbo cold forceps. Resected and                         retrieved.                        - One 8 mm polyp at the hepatic flexure, removed with                         a hot snare. Resected and retrieved.                        - One 7 mm polyp in the rectum, removed with a hot                         snare. Resected and retrieved.                        - Normal mucosa in the right colon. Biopsied.                        - Normal mucosa in the left colon. Biopsied.                        - Friability with no bleeding in the distal sigmoid                         colon. Biopsied.                        - Segmental mild inflammation was found in the rectum  secondary to proctitis. Biopsied.                        - The examination was otherwise normal. Recommendation:        - Await pathology results from EGD, also performed                         today.                        - Patient has a contact number available for                         emergencies. The signs and symptoms of potential                         delayed complications were discussed with the patient.                         Return to normal activities tomorrow. Written                         discharge instructions were provided  to the patient.                        - Resume previous diet.                        - Continue present medications.                        - Await pathology results.                        - Repeat colonoscopy is recommended for surveillance.                         The colonoscopy date will be determined after                         pathology results from today's exam become available                         for review.                        - Return to GI office in 6 weeks.                        - The findings and recommendations were discussed with                         the patient. Procedure Code(s):     --- Professional ---                        [id number], Colonoscopy, flexible; with removal of                         tumor(s), polyp(s), or other lesion(s) by snare  technique                        [id number], 59, Colonoscopy, flexible; with biopsy, single                         or multiple Diagnosis Code(s):     --- Professional ---                        K57.30, Diverticulosis of large intestine without                         perforation or abscess without bleeding                        R19.7, Diarrhea, unspecified                        K52.9, Noninfective gastroenteritis and colitis,                         unspecified                        K62.89, Other specified diseases of anus and rectum                        K63.89, Other specified diseases of intestine                        K62.1, Rectal polyp                        K63.5, Polyp of colon CPT copyright 2019 American Medical Association. All rights reserved. The codes documented in this report are preliminary and upon coder review may  be revised to meet current compliance requirements. Efrain Sella MD, MD 05/01/2019 11:02:17 AM This report has been signed electronically. Number of Addenda: 0 Note Initiated On: 05/01/2019 10:16 AM Scope Withdrawal Time: 0 hours 13 minutes 43 seconds   Total Procedure Duration: 0 hours 19 minutes 6 seconds  Estimated Blood Loss:  Estimated blood loss was minimal. Estimated blood                         loss: none. Estimated blood loss was minimal.      [facility]

## 2019-05-01 NOTE — Anesthesia Procedure Notes (Signed)
Date/Time: 05/01/2019 10:18 AM Performed by: Doreen Salvage, CRNA Pre-anesthesia Checklist: Patient identified, Emergency Drugs available, Suction available and Patient being monitored Patient Re-evaluated:Patient Re-evaluated prior to induction Oxygen Delivery Method: Nasal cannula Induction Type: IV induction Dental Injury: Teeth and Oropharynx as per pre-operative assessment  Comments: Nasal cannula with etCO2 monitoring

## 2019-05-01 NOTE — Transfer of Care (Signed)
Immediate Anesthesia Transfer of Care Note  Patient: Brianna Briggs  Procedure(s) Performed: Procedure(s): COLONOSCOPY WITH PROPOFOL (N/A) ESOPHAGOGASTRODUODENOSCOPY (EGD) WITH PROPOFOL (N/A)  Patient Location: PACU and Endoscopy Unit  Anesthesia Type:General  Level of Consciousness: sedated  Airway & Oxygen Therapy: Patient Spontanous Breathing and Patient connected to nasal cannula oxygen  Post-op Assessment: Report given to RN and Post -op Vital signs reviewed and stable  Post vital signs: Reviewed and stable  Last Vitals:  Vitals:   05/01/19 0844 05/01/19 1055  BP: 110/78 103/75  Pulse: (!) 117 (!) 115  Resp: 20 19  Temp: (!) 35.8 C 36.6 C  SpO2: [id number] (!) [id number]    Complications: No apparent anesthesia complications

## 2019-05-01 NOTE — Interval H&P Note (Signed)
History and Physical Interval Note:  05/01/2019 10:15 AM  Brianna Briggs  has presented today for surgery, with the diagnosis of COLITIS EPIG PAIN BLOATING.  The various methods of treatment have been discussed with the patient and family. After consideration of risks, benefits and other options for treatment, the patient has consented to  Procedure(s): COLONOSCOPY WITH PROPOFOL (N/A) ESOPHAGOGASTRODUODENOSCOPY (EGD) WITH PROPOFOL (N/A) as a surgical intervention.  The patient's history has been reviewed, patient examined, no change in status, stable for surgery.  I have reviewed the patient's chart and labs.  Questions were answered to the patient's satisfaction.     [place], [place]

## 2019-05-01 NOTE — Op Note (Signed)
[facility] Gastroenterology Patient Name: Yamaira Bhardwaj Procedure Date: 05/01/2019 10:16 AM MRN: [id number] Account #: [id number] Date of Birth: 1950-10-08 Admit Type: Outpatient Age: 69 Room: [facility] ENDO ROOM 3 Gender: Female Note Status: Finalized Procedure:             Upper GI endoscopy Indications:           Abdominal pain in the left lower quadrant, Dyspepsia,                         Early satiety Providers:             Benay Pike. Alice Reichert MD, MD Referring MD:          Ocie Cornfield. Ouida Sills MD, MD (Referring MD) Medicines:             Propofol per Anesthesia Complications:         No immediate complications. Procedure:             Pre-Anesthesia Assessment:                        - The risks and benefits of the procedure and the                         sedation options and risks were discussed with the                         patient. All questions were answered and informed                         consent was obtained.                        - Patient identification and proposed procedure were                         verified prior to the procedure by the nurse. The                         procedure was verified in the procedure room.                        - ASA Grade Assessment: III - A patient with severe                         systemic disease.                        - After reviewing the risks and benefits, the patient                         was deemed in satisfactory condition to undergo the                         procedure.                        After obtaining informed consent, the endoscope was                         passed under direct  vision. Throughout the procedure,                         the patient's blood pressure, pulse, and oxygen                         saturations were monitored continuously. The Endoscope                         was introduced through the mouth, and advanced to the                         third part of duodenum. The  upper GI endoscopy was                         accomplished without difficulty. The patient tolerated                         the procedure well. Findings:      Non-severe esophagitis with no bleeding was found in the distal       esophagus.      The Z-line was irregular and was found at the gastroesophageal junction.       Mucosa was biopsied with a cold forceps for histology. One specimen       bottle was sent to pathology.      A single 10 mm sessile polyp with no bleeding and no stigmata of recent       bleeding was found in the prepyloric region of the stomach. Biopsies       were taken with a cold forceps for histology.      The cardia and gastric fundus were normal on retroflexion.      The examined duodenum was normal. Impression:            - Non-severe reflux esophagitis with no bleeding.                        - Z-line irregular, at the gastroesophageal junction.                         Biopsied.                        - A single gastric polyp. Biopsied.                        - Normal examined duodenum. Recommendation:        - Await pathology results.                        - Proceed with colonoscopy Procedure Code(s):     --- Professional ---                        [phone], Esophagogastroduodenoscopy, flexible,                         transoral; with biopsy, single or multiple Diagnosis Code(s):     --- Professional ---                        R68.81, Early satiety  R10.13, Epigastric pain                        R10.32, Left lower quadrant pain                        K31.7, Polyp of stomach and duodenum                        K22.8, Other specified diseases of esophagus                        K21.00, Gastro-esophageal reflux disease with                         esophagitis, without bleeding CPT copyright 2019 American Medical Association. All rights reserved. The codes documented in this report are preliminary and upon coder review may  be revised to  meet current compliance requirements. Efrain Sella MD, MD 05/01/2019 10:30:38 AM This report has been signed electronically. Number of Addenda: 0 Note Initiated On: 05/01/2019 10:16 AM Estimated Blood Loss:  Estimated blood loss: none.      [facility]

## 2019-05-01 NOTE — Anesthesia Preprocedure Evaluation (Addendum)
Anesthesia Evaluation  Patient identified by MRN, date of birth, ID band Patient awake    Reviewed: Allergy & Precautions, NPO status , Patient's Chart, lab work & pertinent test results  Airway Mallampati: II  TM Distance: >3 FB     Dental   Pulmonary shortness of breath and with exertion, COPD, Current Smoker,    Pulmonary exam normal        Cardiovascular negative cardio ROS Normal cardiovascular exam     Neuro/Psych negative neurological ROS  negative psych ROS   GI/Hepatic Neg liver ROS,   Endo/Other  negative endocrine ROS  Renal/GU Renal disease  Female GU complaint     Musculoskeletal  (+) Arthritis , Osteoarthritis,    Abdominal Normal abdominal exam  (+)   Peds negative pediatric ROS (+)  Hematology negative hematology ROS (+)   Anesthesia Other Findings Past Medical History: No date: Allergic genetic state 1996: Breast cancer, right ([place])     Comment:  Mastectomy No date: History of chicken pox No date: History of colon polyps No date: Hyperlipidemia No date: Single kidney  Reproductive/Obstetrics                            Anesthesia Physical Anesthesia Plan  ASA: III  Anesthesia Plan: General   Post-op Pain Management:    Induction: Intravenous  PONV Risk Score and Plan: Propofol infusion  Airway Management Planned: Nasal Cannula  Additional Equipment:   Intra-op Plan:   Post-operative Plan:   Informed Consent: I have reviewed the patients History and Physical, chart, labs and discussed the procedure including the risks, benefits and alternatives for the proposed anesthesia with the patient or authorized representative who has indicated his/her understanding and acceptance.     Dental advisory given  Plan Discussed with: CRNA and Surgeon  Anesthesia Plan Comments:         Anesthesia Quick Evaluation

## 2019-05-01 NOTE — Anesthesia Postprocedure Evaluation (Signed)
Anesthesia Post Note  Patient: Brianna Briggs  Procedure(s) Performed: COLONOSCOPY WITH PROPOFOL (N/A ) ESOPHAGOGASTRODUODENOSCOPY (EGD) WITH PROPOFOL (N/A )  Anesthesia Type: General Level of consciousness: awake and alert and oriented Pain management: pain level controlled Vital Signs Assessment: post-procedure vital signs reviewed and stable Respiratory status: spontaneous breathing Cardiovascular status: blood pressure returned to baseline Anesthetic complications: no     Last Vitals:  Vitals:   05/01/19 1115 05/01/19 1125  BP: 105/90 126/83  Pulse: (!) 107 (!) 117  Resp: 11 19  Temp:    SpO2: 100% 95%    Last Pain:  Vitals:   05/01/19 1125  TempSrc:   PainSc: 0-No pain                 Ellean Firman

## 2019-05-01 NOTE — H&P (Signed)
Outpatient short stay form Pre-procedure 05/01/2019 10:13 AM Brianna Briggs Brianna Briggs, M.D.  Primary Physician: Frazier Richards, M.D.  Reason for visit:  Early satiety, LLQ pain, chronic colitis, f/u diverticulitis."Loose stools" I.e. diarrhea.  History of present illness:  Patient has over 2 months of "loose stools" with documented histologic findings from last colonoscopy in 2012 showing "active chronic colitis". Patient had treatment for presumed diverticulitis which resolved abdominal pain, but early satiety and loose stools continue. No hematochezia.    Current Facility-Administered Medications:  .  0.9 %  sodium chloride infusion, , Intravenous, Continuous, [place], Benay Pike, MD, Last Rate: 20 mL/hr at 05/01/19 0913, New Bag at 05/01/19 0913  Medications Prior to Admission  Medication Sig Dispense Refill Last Dose  . Crisaborole (EUCRISA) 2 % OINT Apply 1 application topically 2 (two) times daily. (Patient not taking: Reported on 05/01/2019) 60 g 2 Not Taking at Unknown time  . dicyclomine (BENTYL) 10 MG/5ML solution Take 10 mg by mouth 4 (four) times daily -  before meals and at bedtime.   Not Taking at Unknown time  . mometasone (ELOCON) 0.1 % cream Apply 1 application topically daily as needed (Rash). Apply bid x 2 weeks then decrease to qd up to 5 days per week (Patient not taking: Reported on 05/01/2019) 45 g 2 Not Taking at Unknown time  . rosuvastatin (CRESTOR) 20 MG tablet Take 20 mg by mouth daily.   Not Taking at Unknown time  . triamcinolone cream (KENALOG) 0.1 % Apply 1 application topically 2 (two) times daily.   Not Taking at Unknown time     Allergies  Allergen Reactions  . Soy Protein  [Soybean Oil] Rash  . Wheat Bran Rash  . Amoxicillin-Pot Clavulanate Diarrhea    Other reaction(s): Unknown  . Biaxin [Clarithromycin] Diarrhea    Other reaction(s): Unknown  . Oxycodone Itching  . Codeine Rash  . Nitrofurantoin Rash  . Soy Allergy Rash     Past Medical History:   Diagnosis Date  . Allergic genetic state   . Breast cancer, right ([place]) 1996   Mastectomy  . History of chicken pox   . History of colon polyps   . Hyperlipidemia   . Single kidney     Review of systems:  Otherwise negative.    Physical Exam  Gen: Alert, oriented. Appears stated age.  HEENT: /AT. PERRLA. Lungs: CTA, no wheezes. CV: RR nl S1, S2. Abd: soft, benign, no masses. BS+ Ext: No edema. Pulses 2+    Planned procedures: Proceed with EGD and colonoscopy. The patient understands the nature of the planned procedure, indications, risks, alternatives and potential complications including but not limited to bleeding, infection, perforation, damage to internal organs and possible oversedation/side effects from anesthesia. The patient agrees and gives consent to proceed.  Please refer to procedure notes for findings, recommendations and patient disposition/instructions.     Brianna Briggs Brianna Briggs, M.D. Gastroenterology 05/01/2019  10:13 AM

## 2019-05-02 ENCOUNTER — Encounter: Payer: Self-pay | Admitting: *Deleted

## 2019-05-05 LAB — SURGICAL PATHOLOGY

## 2019-06-30 ENCOUNTER — Emergency Department: Payer: Medicare Other

## 2019-06-30 ENCOUNTER — Inpatient Hospital Stay
Admission: EM | Admit: 2019-06-30 | Discharge: 2019-07-04 | DRG: 392 | Disposition: A | Discharge status: Home / Self Care | Payer: Medicare Other | Attending: Surgery | Admitting: Surgery

## 2019-06-30 ENCOUNTER — Encounter: Payer: Self-pay | Admitting: Radiology

## 2019-06-30 ENCOUNTER — Other Ambulatory Visit: Payer: Self-pay

## 2019-06-30 DIAGNOSIS — F17200 Nicotine dependence, unspecified, uncomplicated: Secondary | ICD-10-CM | POA: Diagnosis present

## 2019-06-30 DIAGNOSIS — K5792 Diverticulitis of intestine, part unspecified, without perforation or abscess without bleeding: Secondary | ICD-10-CM | POA: Diagnosis present

## 2019-06-30 DIAGNOSIS — Z885 Allergy status to narcotic agent status: Secondary | ICD-10-CM | POA: Diagnosis not present

## 2019-06-30 DIAGNOSIS — K5720 Diverticulitis of large intestine with perforation and abscess without bleeding: Secondary | ICD-10-CM | POA: Diagnosis present

## 2019-06-30 DIAGNOSIS — Z9011 Acquired absence of right breast and nipple: Secondary | ICD-10-CM | POA: Diagnosis not present

## 2019-06-30 DIAGNOSIS — Z853 Personal history of malignant neoplasm of breast: Secondary | ICD-10-CM | POA: Diagnosis not present

## 2019-06-30 DIAGNOSIS — Z90710 Acquired absence of both cervix and uterus: Secondary | ICD-10-CM

## 2019-06-30 DIAGNOSIS — B952 Enterococcus as the cause of diseases classified elsewhere: Secondary | ICD-10-CM | POA: Diagnosis present

## 2019-06-30 DIAGNOSIS — Z841 Family history of disorders of kidney and ureter: Secondary | ICD-10-CM | POA: Diagnosis not present

## 2019-06-30 DIAGNOSIS — Z888 Allergy status to other drugs, medicaments and biological substances status: Secondary | ICD-10-CM | POA: Diagnosis not present

## 2019-06-30 DIAGNOSIS — Z86010 Personal history of colonic polyps: Secondary | ICD-10-CM

## 2019-06-30 DIAGNOSIS — Z20822 Contact with and (suspected) exposure to covid-19: Secondary | ICD-10-CM | POA: Diagnosis present

## 2019-06-30 DIAGNOSIS — K760 Fatty (change of) liver, not elsewhere classified: Secondary | ICD-10-CM | POA: Diagnosis present

## 2019-06-30 DIAGNOSIS — K5780 Diverticulitis of intestine, part unspecified, with perforation and abscess without bleeding: Secondary | ICD-10-CM | POA: Diagnosis present

## 2019-06-30 DIAGNOSIS — Z79899 Other long term (current) drug therapy: Secondary | ICD-10-CM | POA: Diagnosis not present

## 2019-06-30 DIAGNOSIS — E785 Hyperlipidemia, unspecified: Secondary | ICD-10-CM | POA: Diagnosis present

## 2019-06-30 LAB — URINALYSIS, COMPLETE (UACMP) WITH MICROSCOPIC
Bacteria, UA: NONE SEEN
Bilirubin Urine: NEGATIVE
Glucose, UA: NEGATIVE mg/dL
Hgb urine dipstick: NEGATIVE
Ketones, ur: NEGATIVE mg/dL
Nitrite: NEGATIVE
Protein, ur: NEGATIVE mg/dL
Specific Gravity, Urine: 1.01 (ref 1.005–1.030)
pH: 6 (ref 5.0–8.0)

## 2019-06-30 LAB — COMPREHENSIVE METABOLIC PANEL
ALT: 11 U/L (ref 0–44)
AST: 17 U/L (ref 15–41)
Albumin: 3.4 g/dL — ABNORMAL LOW (ref 3.5–5.0)
Alkaline Phosphatase: 56 U/L (ref 38–126)
Anion gap: 9 (ref 5–15)
BUN: <5 mg/dL — ABNORMAL LOW (ref 8–23)
CO2: 30 mmol/L (ref 22–32)
Calcium: 8.6 mg/dL — ABNORMAL LOW (ref 8.9–10.3)
Chloride: 99 mmol/L (ref 98–111)
Creatinine, Ser: 1.04 mg/dL — ABNORMAL HIGH (ref 0.44–1.00)
GFR calc Af Amer: >60 mL/min (ref >60)
GFR calc non Af Amer: 55 mL/min — ABNORMAL LOW (ref >60)
Glucose, Bld: 116 mg/dL — ABNORMAL HIGH (ref 70–99)
Potassium: 3.4 mmol/L — ABNORMAL LOW (ref 3.5–5.1)
Sodium: 138 mmol/L (ref 135–145)
Total Bilirubin: 0.3 mg/dL (ref 0.3–1.2)
Total Protein: 6.9 g/dL (ref 6.5–8.1)

## 2019-06-30 LAB — CBC
HCT: 46.9 % — ABNORMAL HIGH (ref 36.0–46.0)
Hemoglobin: 16.3 g/dL — ABNORMAL HIGH (ref 12.0–15.0)
MCH: 31.4 pg (ref 26.0–34.0)
MCHC: 34.8 g/dL (ref 30.0–36.0)
MCV: 90.4 fL (ref 80.0–100.0)
Platelets: 235 10*3/uL (ref 150–400)
RBC: 5.19 MIL/uL — ABNORMAL HIGH (ref 3.87–5.11)
RDW: 13.4 % (ref 11.5–15.5)
WBC: 12 10*3/uL — ABNORMAL HIGH (ref 4.0–10.5)
nRBC: 0 % (ref 0.0–0.2)

## 2019-06-30 LAB — SARS CORONAVIRUS 2 BY RT PCR (HOSPITAL ORDER, PERFORMED IN ~~LOC~~ HOSPITAL LAB): SARS Coronavirus 2: NEGATIVE

## 2019-06-30 LAB — LIPASE, BLOOD: Lipase: 20 U/L (ref 11–51)

## 2019-06-30 MED ORDER — POTASSIUM CHLORIDE CRYS ER 20 MEQ PO TBCR
20.0000 meq | EXTENDED_RELEASE_TABLET | Freq: Two times a day (BID) | ORAL | Status: DC
  Administered 2019-06-30 – 2019-07-04 (×10): 20 meq via ORAL
  Filled 2019-06-30 (×10): qty 1

## 2019-06-30 MED ORDER — ONDANSETRON HCL 4 MG/2ML IJ SOLN
4.0000 mg | Freq: Four times a day (QID) | INTRAMUSCULAR | Status: DC | PRN
  Administered 2019-07-01 – 2019-07-03 (×3): 4 mg via INTRAVENOUS
  Filled 2019-06-30 (×3): qty 2

## 2019-06-30 MED ORDER — ROSUVASTATIN CALCIUM 20 MG PO TABS: 20.0000 mg | ORAL_TABLET | Freq: Every day | ORAL | Status: DC

## 2019-06-30 MED ORDER — FENTANYL CITRATE (PF) 100 MCG/2ML IJ SOLN
25.0000 ug | Freq: Once | INTRAMUSCULAR | Status: AC
  Administered 2019-06-30: 25 ug via INTRAVENOUS
  Filled 2019-06-30: qty 2

## 2019-06-30 MED ORDER — ACETAMINOPHEN 500 MG PO TABS
1000.0000 mg | ORAL_TABLET | Freq: Four times a day (QID) | ORAL | Status: DC
  Administered 2019-06-30: 1000 mg via ORAL
  Filled 2019-06-30 (×2): qty 2

## 2019-06-30 MED ORDER — ONDANSETRON 4 MG PO TBDP: 4.0000 mg | ORAL_TABLET | Freq: Four times a day (QID) | ORAL | Status: DC | PRN

## 2019-06-30 MED ORDER — SODIUM CHLORIDE 0.9 % IV SOLN: INTRAVENOUS | Status: DC

## 2019-06-30 MED ORDER — MORPHINE SULFATE (PF) 2 MG/ML IV SOLN
2.0000 mg | INTRAVENOUS | Status: DC | PRN
  Administered 2019-07-01: 2 mg via INTRAVENOUS
  Filled 2019-06-30: qty 1

## 2019-06-30 MED ORDER — PIPERACILLIN-TAZOBACTAM 3.375 G IVPB
3.3750 g | Freq: Three times a day (TID) | INTRAVENOUS | Status: DC
  Administered 2019-06-30 – 2019-07-04 (×12): 3.375 g via INTRAVENOUS
  Filled 2019-06-30 (×12): qty 50

## 2019-06-30 MED ORDER — PIPERACILLIN-TAZOBACTAM 3.375 G IVPB 30 MIN
3.3750 g | Freq: Once | INTRAVENOUS | Status: AC
  Administered 2019-06-30: 3.375 g via INTRAVENOUS
  Filled 2019-06-30: qty 50

## 2019-06-30 MED ORDER — ONDANSETRON HCL 4 MG/2ML IJ SOLN
4.0000 mg | Freq: Once | INTRAMUSCULAR | Status: AC
  Administered 2019-06-30: 4 mg via INTRAVENOUS
  Filled 2019-06-30: qty 2

## 2019-06-30 MED ORDER — IOHEXOL 300 MG/ML  SOLN
75.0000 mL | Freq: Once | INTRAMUSCULAR | Status: AC | PRN
  Administered 2019-06-30: 75 mL via INTRAVENOUS
  Filled 2019-06-30: qty 75

## 2019-06-30 MED ORDER — DICYCLOMINE HCL 10 MG/5ML PO SOLN: 10.0000 mg | Freq: Three times a day (TID) | ORAL | Status: DC

## 2019-06-30 MED ORDER — SODIUM CHLORIDE 0.9 % IV SOLN
1000.0000 mL | Freq: Once | INTRAVENOUS | Status: AC
  Administered 2019-06-30: 1000 mL via INTRAVENOUS

## 2019-06-30 NOTE — H&P (Signed)
[facility] SURGICAL HISTORY & PHYSICAL (cpt [phone])  HISTORY OF PRESENT ILLNESS (HPI):  69 y.o. female presented to [facility] ED today for abdominal pain. Patient reports that she has had LLQ abdominal pain since around 06/05. This has been in her LLQ. She described this as a sharp pain. She called her PCP about this and they started her on Cipro/Flagyl on Tuesday of last week. She has a history of diverticulitis and this feels similar to that episode. She endorses associated chill, nausea, and dry heaves with the pain. No recorded fever, cough, congestion, CP, SOB, urinary changes, or bowel changes. Last colonoscopy in April of this year with [facility] G[facility]enterology but I am unable to review the report in Care everywhere. Previous abdominal surgeries positive for abdominal hysterectomy and laparoscopic tubal ligation. Work up in the ED concerning for leukocytosis with WBC to 12.0K, mild hypokalemia to 3.4, sCr - 1.04 (unsure of baseline), and CT Abdomen/Pelvis was concerning for sigmoid diverticulitis with abscess.   General surgery is consulted by emergency medicine physician Dr Lavonia Drafts, MD for evaluation and management of sigmoid diverticulitis with abscess.    PAST MEDICAL HISTORY (PMH):  Past Medical History:  Diagnosis Date  . Allergic genetic state   . Breast cancer, right ([place]) 1996   Mastectomy  . History of chicken pox   . History of colon polyps   . Hyperlipidemia   . Single kidney     Reviewed. Otherwise negative.   PAST SURGICAL HISTORY ([place]):  Past Surgical History:  Procedure Laterality Date  . ABDOMINAL HYSTERECTOMY    . COLONOSCOPY WITH PROPOFOL N/A 05/01/2019   Procedure: COLONOSCOPY WITH PROPOFOL;  Surgeon: Toledo, Benay Pike, MD;  Location: [facility] ENDOSCOPY;  Service: Gastroenterology;  Laterality: N/A;  . ESOPHAGOGASTRODUODENOSCOPY (EGD) WITH PROPOFOL N/A 05/01/2019   Procedure: ESOPHAGOGASTRODUODENOSCOPY (EGD) WITH PROPOFOL;  Surgeon: Toledo, Benay Pike, MD;   Location: [facility] ENDOSCOPY;  Service: Gastroenterology;  Laterality: N/A;  . MASTECTOMY    . SHOULDER ARTHROSCOPY WITH SUBACROMIAL DECOMPRESSION, ROTATOR CUFF REPAIR AND BICEP TENDON REPAIR Left 01/13/2008  . TONSILLECTOMY    . TUBAL LIGATION      Reviewed. Otherwise negative.   MEDICATIONS:  Prior to Admission medications   Medication Sig Start Date End Date Taking? Authorizing Provider  Crisaborole (EUCRISA) 2 % OINT Apply 1 application topically 2 (two) times daily. Patient not taking: Reported on 05/01/2019 04/22/19   Ralene Bathe, MD  dicyclomine (BENTYL) 10 MG/5ML solution Take 10 mg by mouth 4 (four) times daily -  before meals and at bedtime.    [provider]  mometasone (ELOCON) 0.1 % cream Apply 1 application topically daily as needed (Rash). Apply bid x 2 weeks then decrease to qd up to 5 days per week Patient not taking: Reported on 05/01/2019 04/22/19   Ralene Bathe, MD  rosuvastatin (CRESTOR) 20 MG tablet Take 20 mg by mouth daily.    [provider]  triamcinolone cream (KENALOG) 0.1 % Apply 1 application topically 2 (two) times daily.    [provider]     ALLERGIES:  Allergies  Allergen Reactions  . Soy Protein  [Soybean Oil] Rash  . Wheat Bran Rash  . Amoxicillin-Pot Clavulanate Diarrhea    Other reaction(s): Unknown  . Biaxin [Clarithromycin] Diarrhea    Other reaction(s): Unknown  . Oxycodone Itching  . Codeine Rash  . Nitrofurantoin Rash  . Soy Allergy Rash     SOCIAL HISTORY:  Social History   Socioeconomic History  .  Marital status: Married    Spouse name: Not on file  . Number of children: Not on file  . Years of education: Not on file  . Highest education level: Not on file  Occupational History  . Not on file  Tobacco Use  . Smoking status: Current Every Day Smoker    Packs/day: 1.50    Years: 40.00    Pack years: 60.00    Types: Cigarettes  . Smokeless tobacco: Never Used  Vaping Use  . Vaping Use: Never  assessed  Substance and Sexual Activity  . Alcohol use: No  . Drug use: No  . Sexual activity: Not on file  Other Topics Concern  . Not on file  Social History Narrative  . Not on file   Social Determinants of Health   Financial Resource Strain:   . Difficulty of Paying Living Expenses:   Food Insecurity:   . Worried About Charity fundraiser in the Last Year:   . Arboriculturist in the Last Year:   Transportation Needs:   . Film/video editor (Medical):   Marland Kitchen Lack of Transportation (Non-Medical):   Physical Activity:   . Days of Exercise per Week:   . Minutes of Exercise per Session:   Stress:   . Feeling of Stress :   Social Connections:   . Frequency of Communication with Friends and Family:   . Frequency of Social Gatherings with Friends and Family:   . Attends Religious Services:   . Active Member of Clubs or Organizations:   . Attends Archivist Meetings:   Marland Kitchen Marital Status:   Intimate Partner Violence:   . Fear of Current or Ex-Partner:   . Emotionally Abused:   Marland Kitchen Physically Abused:   . Sexually Abused:      FAMILY HISTORY:  Family History  Problem Relation Age of Onset  . Cancer Father   . Kidney failure Mother     Otherwise negative.   REVIEW OF SYSTEMS:  Review of Systems  Constitutional: Positive for chills. Negative for fever.  HENT: Negative for congestion and sore throat.   Respiratory: Negative for cough and shortness of breath.   Cardiovascular: Negative for chest pain and palpitations.  Gastrointestinal: Positive for abdominal pain, nausea and vomiting (Dry Heaves). Negative for blood in stool, constipation and diarrhea.  Genitourinary: Negative for dysuria and urgency.  All other systems reviewed and are negative.   VITAL SIGNS:  Temp:  [98.6 F (37 C)] 98.6 F (37 C) (06/14 1032) Pulse Rate:  [91-112] 91 (06/14 1303) Resp:  [18] 18 (06/14 1303) BP: (109-117)/(82-95) 109/82 (06/14 1303) SpO2:  [93 %-100 %] 100 % (06/14  1303) Weight:  [61.7 kg] 61.7 kg (06/14 1033)     Height: 5\' 2"  (157.5 cm) Weight: 61.7 kg BMI (Calculated): 24.87   PHYSICAL EXAM:  Physical Exam Vitals and nursing note reviewed.  Constitutional:      General: She is not in acute distress.    Appearance: She is well-developed and normal weight. She is not ill-appearing.  HENT:     Head: Normocephalic and atraumatic.  Eyes:     General: No scleral icterus.    Extraocular Movements: Extraocular movements intact.  Cardiovascular:     Rate and Rhythm: Normal rate and regular rhythm.     Heart sounds: Normal heart sounds. No murmur heard.   Pulmonary:     Effort: Pulmonary effort is normal. No respiratory distress.     Breath sounds:  Normal breath sounds.  Abdominal:     General: A surgical scar is present. There is no distension.     Palpations: Abdomen is soft.     Tenderness: There is abdominal tenderness in the left lower quadrant. There is no guarding or rebound.  Genitourinary:    Comments: Deferred Skin:    General: Skin is warm and dry.     Coloration: Skin is not jaundiced or pale.  Neurological:     General: No focal deficit present.     Mental Status: She is alert and oriented to person, place, and time.  Psychiatric:        Mood and Affect: Mood normal.        Behavior: Behavior normal.     INTAKE/OUTPUT:  This shift: No intake/output data recorded.  Last 2 shifts: @IOLAST2SHIFTS @  Labs:  CBC Latest Ref Rng & Units 06/30/2019  WBC 4.0 - 10.5 K/uL 12.0(H)  Hemoglobin 12.0 - 15.0 g/dL 16.3(H)  Hematocrit 36 - 46 % 46.9(H)  Platelets 150 - 400 K/uL 235   CMP Latest Ref Rng & Units 06/30/2019  Glucose 70 - 99 mg/dL 116(H)  BUN 8 - 23 mg/dL <5(L)  Creatinine 0.44 - 1.00 mg/dL 1.04(H)  Sodium 135 - 145 mmol/L 138  Potassium 3.5 - 5.1 mmol/L 3.4(L)  Chloride 98 - 111 mmol/L 99  CO2 22 - 32 mmol/L 30  Calcium 8.9 - 10.3 mg/dL 8.6(L)  Total Protein 6.5 - 8.1 g/dL 6.9  Total Bilirubin 0.3 - 1.2 mg/dL 0.3   Alkaline Phos 38 - 126 U/L 56  AST 15 - 41 U/L 17  ALT 0 - 44 U/L 11    Imaging studies:   CT Abdomen/Pelvis (06/30/2019) personally reviewed showing acute diverticulitis of the sigmoid colon with abscess, no pneumoperitoneum, and radiologist report reviewed:  IMPRESSION: Acute diverticulitis of the proximal sigmoid colon. Regional inflammatory change including a 3 cm adjacent abscess. No free fluid or air.  Chronic fatty liver.  Chronic right renal atrophy.  Mild bladder prolapse.   Assessment/Plan: (ICD-10's: K88.20) 69 y.o. female with leukocytosis and LLQ abdominal pain concerning for acute sigmoid diverticulitis with abscess which has failed outpatient managemet   - Admit to general surgery  - NPO except sips with medications  - IVF resuscitation  - IV ABx (Zosyn)  - Will plan for IR drainage tomorrow, discussed with Dr Pascal Lux  - Monitor abdominal examination; on-going bowel function  - Pain control prn; antiemetics prn   - Morning labs; CBC, BMP, INR, PTT   - Medical management of comorbid conditions; home medications   - DVT prophylaxis; hold for now  All of the above findings and recommendations were discussed with the patient, and all of her questions were answered to her expressed satisfaction.  -- Edison Simon, PA-C [facility] 06/30/2019, 2:20 PM [phone] M-F: 7am - 4pm

## 2019-06-30 NOTE — ED Triage Notes (Signed)
Pt here for abd pain for about a week. Pt has been taking abx for diverticulitis but pain has increased over the past few days. Nausea as well.

## 2019-06-30 NOTE — ED Provider Notes (Signed)
[facility] Emergency Department Provider Note   ____________________________________________    I have reviewed the triage vital signs and the nursing notes.   HISTORY  Chief Complaint Abdominal Pain     HPI Brianna Briggs is a 69 y.o. female with history as noted below who presents with complaints of left lower quadrant abdominal pain.  Patient reports her PCP put her on Cipro and Flagyl approximately 6 days ago however pain has worsened somewhat.  She does describe nausea which she attributes to the antibiotics.  She reports a history of diverticulitis in the past which responded well to antibiotics.  Does have a history of total hysterectomy, no other abdominal surgeries.  No fevers or chills reported.  No dysuria  Past Medical History:  Diagnosis Date  . Allergic genetic state   . Breast cancer, right ([place]) 1996   Mastectomy  . History of chicken pox   . History of colon polyps   . Hyperlipidemia   . Single kidney     Patient Active Problem List   Diagnosis Date Noted  . Chronic eczema 08/30/2015  . Health care maintenance 03/01/2015  . Simple chronic bronchitis ([place]) 03/01/2015  . Shortness of breath 12/16/2014  . Hyperlipidemia, mixed 06/04/2014  . Hypercholesterolemia 02/26/2014  . PVC (premature ventricular contraction) 09/04/2013  . Tobacco abuse 09/04/2013    Past Surgical History:  Procedure Laterality Date  . ABDOMINAL HYSTERECTOMY    . COLONOSCOPY WITH PROPOFOL N/A 05/01/2019   Procedure: COLONOSCOPY WITH PROPOFOL;  Surgeon: Toledo, Benay Pike, MD;  Location: [facility] ENDOSCOPY;  Service: Gastroenterology;  Laterality: N/A;  . ESOPHAGOGASTRODUODENOSCOPY (EGD) WITH PROPOFOL N/A 05/01/2019   Procedure: ESOPHAGOGASTRODUODENOSCOPY (EGD) WITH PROPOFOL;  Surgeon: Toledo, Benay Pike, MD;  Location: [facility] ENDOSCOPY;  Service: Gastroenterology;  Laterality: N/A;  . MASTECTOMY    . SHOULDER ARTHROSCOPY WITH SUBACROMIAL DECOMPRESSION,  ROTATOR CUFF REPAIR AND BICEP TENDON REPAIR Left 01/13/2008  . TONSILLECTOMY    . TUBAL LIGATION      Prior to Admission medications   Medication Sig Start Date End Date Taking? Authorizing Provider  Crisaborole (EUCRISA) 2 % OINT Apply 1 application topically 2 (two) times daily. Patient not taking: Reported on 05/01/2019 04/22/19   Ralene Bathe, MD  dicyclomine (BENTYL) 10 MG/5ML solution Take 10 mg by mouth 4 (four) times daily -  before meals and at bedtime.    [provider]  mometasone (ELOCON) 0.1 % cream Apply 1 application topically daily as needed (Rash). Apply bid x 2 weeks then decrease to qd up to 5 days per week Patient not taking: Reported on 05/01/2019 04/22/19   Ralene Bathe, MD  rosuvastatin (CRESTOR) 20 MG tablet Take 20 mg by mouth daily.    [provider]  triamcinolone cream (KENALOG) 0.1 % Apply 1 application topically 2 (two) times daily.    [provider]     Allergies Soy protein  [soybean oil], Wheat bran, Amoxicillin-pot clavulanate, Biaxin [clarithromycin], Oxycodone, Codeine, Nitrofurantoin, and Soy allergy  Family History  Problem Relation Age of Onset  . Cancer Father   . Kidney failure Mother     Social History Social History   Tobacco Use  . Smoking status: Current Every Day Smoker    Packs/day: 1.50    Years: 40.00    Pack years: 60.00    Types: Cigarettes  . Smokeless tobacco: Never Used  Vaping Use  . Vaping Use: Never assessed  Substance Use Topics  . Alcohol use:  No  . Drug use: No    Review of Systems  Constitutional: No fever/chills Eyes: No visual changes.  ENT: No sore throat. Cardiovascular: Denies chest pain. Respiratory: Denies shortness of breath. Gastrointestinal: As above Genitourinary: As above Musculoskeletal: Negative for back pain. Skin: Negative for rash. Neurological: Negative for headaches   ____________________________________________   PHYSICAL EXAM:  VITAL  SIGNS: ED Triage Vitals  Enc Vitals Group     BP 06/30/19 1032 (!) 117/95     Pulse Rate 06/30/19 1032 (!) 112     Resp 06/30/19 1032 18     Temp 06/30/19 1032 98.6 F (37 C)     Temp Source 06/30/19 1032 Oral     SpO2 06/30/19 1032 93 %     Weight 06/30/19 1033 61.7 kg (136 lb)     Height 06/30/19 1033 1.575 m (5\' 2" )     Head Circumference --      Peak Flow --      Pain Score 06/30/19 1032 4     Pain Loc --      Pain Edu? --      Excl. in [place]? --     Constitutional: Alert and oriented.  Nose: No congestion/rhinnorhea. Mouth/Throat: Mucous membranes are moist.    Cardiovascular: Normal rate, regular rhythm.  Good peripheral circulation. Respiratory: Normal respiratory effort.  No retractions. . Gastrointestinal: Soft, tender to palpation left lower quadrant. no distention.  No CVA tenderness.  Musculoskeletal:  Warm and well perfused Neurologic:  Normal speech and language. No gross focal neurologic deficits are appreciated.  Skin:  Skin is warm, dry and intact. No rash noted. Psychiatric: Mood and affect are normal. Speech and behavior are normal.  ____________________________________________   LABS (all labs ordered are listed, but only abnormal results are displayed)  Labs Reviewed  COMPREHENSIVE METABOLIC PANEL - Abnormal; Notable for the following components:      Result Value   Potassium 3.4 (*)    Glucose, Bld 116 (*)    BUN <5 (*)    Creatinine, Ser 1.04 (*)    Calcium 8.6 (*)    Albumin 3.4 (*)    GFR calc non Af Amer 55 (*)    All other components within normal limits  CBC - Abnormal; Notable for the following components:   WBC 12.0 (*)    RBC 5.19 (*)    Hemoglobin 16.3 (*)    HCT 46.9 (*)    All other components within normal limits  URINALYSIS, COMPLETE (UACMP) WITH MICROSCOPIC - Abnormal; Notable for the following components:   Color, Urine YELLOW (*)    APPearance CLEAR (*)    Leukocytes,Ua TRACE (*)    All other components within normal  limits  LIPASE, BLOOD   ____________________________________________  EKG  ED ECG REPORT I, Lavonia Drafts, the attending physician, personally viewed and interpreted this ECG.  Date: 06/30/2019  Rhythm: Sinus tachycardia QRS Axis: normal Intervals: normal ST/T Wave abnormalities: normal Narrative Interpretation: no evidence of acute ischemia  ____________________________________________  RADIOLOGY  CT abdomen pelvis ____________________________________________   PROCEDURES  Procedure(s) performed: No  Procedures   Critical Care performed: No ____________________________________________   INITIAL IMPRESSION / ASSESSMENT AND PLAN / ED COURSE  Pertinent labs & imaging results that were available during my care of the patient were reviewed by me and considered in my medical decision making (see chart for details).  Patient presents with left lower quadrant abdominal pain for approximately 1 week, no significant improvement with Cipro Flagyl x5  to 6 days.  Differential includes diverticulitis, diverticular abscess, colitis, gastroenteritis.  We will give IV morphine, IV Zofran, obtain CT imaging.  Lab work significant for mildly elevated white blood cell count.  CT scan demonstrates acute diverticulitis with 3 cm abscess.  I have consulted general surgery, spoke with PA who will see the patient with Dr. Christian Mate  Discussed with patient the need for admission.  Have ordered IV Zosyn     ____________________________________________   FINAL CLINICAL IMPRESSION(S) / ED DIAGNOSES  Final diagnoses:  Diverticulitis  Colonic diverticular abscess        Note:  This document was prepared using Dragon voice recognition software and may include unintentional dictation errors.   Lavonia Drafts, MD 06/30/19 1400

## 2019-07-01 ENCOUNTER — Inpatient Hospital Stay: Payer: Medicare Other

## 2019-07-01 LAB — CBC
HCT: 43.2 % (ref 36.0–46.0)
Hemoglobin: 14.2 g/dL (ref 12.0–15.0)
MCH: 30.5 pg (ref 26.0–34.0)
MCHC: 32.9 g/dL (ref 30.0–36.0)
MCV: 92.9 fL (ref 80.0–100.0)
Platelets: 208 10*3/uL (ref 150–400)
RBC: 4.65 MIL/uL (ref 3.87–5.11)
RDW: 13.4 % (ref 11.5–15.5)
WBC: 10.2 10*3/uL (ref 4.0–10.5)
nRBC: 0 % (ref 0.0–0.2)

## 2019-07-01 LAB — BASIC METABOLIC PANEL
Anion gap: 8 (ref 5–15)
BUN: <5 mg/dL — ABNORMAL LOW (ref 8–23)
CO2: 28 mmol/L (ref 22–32)
Calcium: 8.1 mg/dL — ABNORMAL LOW (ref 8.9–10.3)
Chloride: 104 mmol/L (ref 98–111)
Creatinine, Ser: 0.97 mg/dL (ref 0.44–1.00)
GFR calc Af Amer: >60 mL/min (ref >60)
GFR calc non Af Amer: 60 mL/min — ABNORMAL LOW (ref >60)
Glucose, Bld: 95 mg/dL (ref 70–99)
Potassium: 3.6 mmol/L (ref 3.5–5.1)
Sodium: 140 mmol/L (ref 135–145)

## 2019-07-01 LAB — PROTIME-INR
INR: 1.3 — ABNORMAL HIGH (ref 0.8–1.2)
Prothrombin Time: 15.6 seconds — ABNORMAL HIGH (ref 11.4–15.2)

## 2019-07-01 LAB — APTT: aPTT: 35 seconds (ref 24–36)

## 2019-07-01 MED ORDER — OXYCODONE HCL 5 MG PO TABS
5.0000 mg | ORAL_TABLET | ORAL | Status: DC | PRN
  Administered 2019-07-01: 5 mg via ORAL
  Administered 2019-07-02 – 2019-07-03 (×2): 10 mg via ORAL
  Filled 2019-07-01 (×2): qty 2
  Filled 2019-07-01: qty 1

## 2019-07-01 MED ORDER — MIDAZOLAM HCL 2 MG/2ML IJ SOLN
INTRAMUSCULAR | Status: AC
  Filled 2019-07-01: qty 2

## 2019-07-01 MED ORDER — FENTANYL CITRATE (PF) 100 MCG/2ML IJ SOLN
INTRAMUSCULAR | Status: AC
  Filled 2019-07-01: qty 2

## 2019-07-01 MED ORDER — MIDAZOLAM HCL 2 MG/2ML IJ SOLN
INTRAMUSCULAR | Status: DC | PRN
  Administered 2019-07-01 (×2): 1 mg via INTRAVENOUS

## 2019-07-01 MED ORDER — ALUM & MAG HYDROXIDE-SIMETH 200-200-20 MG/5ML PO SUSP
15.0000 mL | ORAL | Status: DC | PRN
  Administered 2019-07-01 – 2019-07-03 (×2): 15 mL via ORAL
  Filled 2019-07-01 (×2): qty 30

## 2019-07-01 MED ORDER — KETOROLAC TROMETHAMINE 15 MG/ML IJ SOLN
15.0000 mg | Freq: Four times a day (QID) | INTRAMUSCULAR | Status: DC | PRN
  Administered 2019-07-01 – 2019-07-04 (×10): 15 mg via INTRAVENOUS
  Filled 2019-07-01 (×10): qty 1

## 2019-07-01 MED ORDER — FENTANYL CITRATE (PF) 100 MCG/2ML IJ SOLN
INTRAMUSCULAR | Status: DC | PRN
  Administered 2019-07-01 (×2): 50 ug via INTRAVENOUS

## 2019-07-01 MED ORDER — SODIUM CHLORIDE 0.9% FLUSH
5.0000 mL | Freq: Three times a day (TID) | INTRAVENOUS | Status: DC
  Administered 2019-07-01 – 2019-07-04 (×8): 5 mL

## 2019-07-01 NOTE — Consult Note (Signed)
Chief Complaint: Diverticular abscess  Referring Physician(s): [place]  Patient Status: [place] - In-pt  History of Present Illness: Brianna Briggs is a 69 y.o. female with past medical history significant for breast cancer who presented to the [facility] emergency department yesterday with left lower quadrant abdominal pain with CT scan demonstrating a diverticular abscess with the left lower abdomen/pelvis.  As such, request made for attempted CT guided aspiration and/or drainage catheter placement for infection source control purposes.    Patient continues to endorse left lower quadrant abdominal pain.  She is otherwise without complaint.  Specifically, no fever or chills.  No chest pain or shortness of breath.  Past Medical History:  Diagnosis Date  . Allergic genetic state   . Breast cancer, right ([place]) 1996   Mastectomy  . History of chicken pox   . History of colon polyps   . Hyperlipidemia   . Single kidney     Past Surgical History:  Procedure Laterality Date  . ABDOMINAL HYSTERECTOMY    . COLONOSCOPY WITH PROPOFOL N/A 05/01/2019   Procedure: COLONOSCOPY WITH PROPOFOL;  Surgeon: Toledo, Benay Pike, MD;  Location: [facility] ENDOSCOPY;  Service: Gastroenterology;  Laterality: N/A;  . ESOPHAGOGASTRODUODENOSCOPY (EGD) WITH PROPOFOL N/A 05/01/2019   Procedure: ESOPHAGOGASTRODUODENOSCOPY (EGD) WITH PROPOFOL;  Surgeon: Toledo, Benay Pike, MD;  Location: [facility] ENDOSCOPY;  Service: Gastroenterology;  Laterality: N/A;  . MASTECTOMY    . SHOULDER ARTHROSCOPY WITH SUBACROMIAL DECOMPRESSION, ROTATOR CUFF REPAIR AND BICEP TENDON REPAIR Left 01/13/2008  . TONSILLECTOMY    . TUBAL LIGATION      Allergies: Soy protein  [soybean oil], Wheat bran, Amoxicillin-pot clavulanate, Biaxin [clarithromycin], Oxycodone, Codeine, Nitrofurantoin, and Soy allergy  Medications: Prior to Admission medications   Medication Sig Start Date End Date Taking? Authorizing Provider    ciprofloxacin (CIPRO) 500 MG tablet Take 500 mg by mouth 2 (two) times daily. 06/23/19  Yes [provider]  metroNIDAZOLE (FLAGYL) 500 MG tablet Take 500 mg by mouth 3 (three) times daily. 06/23/19  Yes [provider]  mometasone (ELOCON) 0.1 % cream Apply 1 application topically daily as needed (Rash). Apply bid x 2 weeks then decrease to qd up to 5 days per week 04/22/19  Yes Ralene Bathe, MD  ondansetron (ZOFRAN-ODT) 4 MG disintegrating tablet Take 4 mg by mouth every 8 (eight) hours as needed for nausea. 06/23/19  Yes [provider]  Crisaborole (EUCRISA) 2 % OINT Apply 1 application topically 2 (two) times daily. Patient not taking: Reported on 05/01/2019 04/22/19   Ralene Bathe, MD  dicyclomine (BENTYL) 10 MG/5ML solution Take 10 mg by mouth 4 (four) times daily -  before meals and at bedtime. Patient not taking: Reported on 06/30/2019    [provider]  fluticasone (FLONASE) 50 MCG/ACT nasal spray Place 2 sprays into both nostrils daily. 05/02/19   [provider]  rosuvastatin (CRESTOR) 20 MG tablet Take 20 mg by mouth daily. Patient not taking: Reported on 06/30/2019    [provider]  triamcinolone cream (KENALOG) 0.1 % Apply 1 application topically 2 (two) times daily. Patient not taking: Reported on 06/30/2019    [provider]     Family History  Problem Relation Age of Onset  . Cancer Father   . Kidney failure Mother     Social History   Socioeconomic History  . Marital status: Married    Spouse name: Not on file  . Number of children: Not on file  .  Years of education: Not on file  . Highest education level: Not on file  Occupational History  . Not on file  Tobacco Use  . Smoking status: Current Every Day Smoker    Packs/day: 1.50    Years: 40.00    Pack years: 60.00    Types: Cigarettes  . Smokeless tobacco: Never Used  Vaping Use  . Vaping Use: Never assessed  Substance and Sexual Activity  .  Alcohol use: No  . Drug use: No  . Sexual activity: Not on file  Other Topics Concern  . Not on file  Social History Narrative   Lives at home with husband   Social Determinants of Health   Financial Resource Strain:   . Difficulty of Paying Living Expenses:   Food Insecurity:   . Worried About Charity fundraiser in the Last Year:   . Arboriculturist in the Last Year:   Transportation Needs:   . Film/video editor (Medical):   Marland Kitchen Lack of Transportation (Non-Medical):   Physical Activity:   . Days of Exercise per Week:   . Minutes of Exercise per Session:   Stress:   . Feeling of Stress :   Social Connections:   . Frequency of Communication with Friends and Family:   . Frequency of Social Gatherings with Friends and Family:   . Attends Religious Services:   . Active Member of Clubs or Organizations:   . Attends Archivist Meetings:   Marland Kitchen Marital Status:     ECOG Status: 1 - Symptomatic but completely ambulatory  Review of Systems: A 12 point ROS discussed and pertinent positives are indicated in the HPI above.  All other systems are negative.  Review of Systems  Vital Signs: BP (!) 102/59 (BP Location: Left Arm)   Pulse 88   Temp 98.1 F (36.7 C) (Oral)   Resp 20   Ht 5\' 2"  (1.575 m)   Wt 61.7 kg   SpO2 96%   BMI 24.87 kg/m   Physical Exam  Imaging: CT ABDOMEN PELVIS W CONTRAST  Result Date: 06/30/2019 CLINICAL DATA:  Abdominal pain over the last week. Suspected diverticulitis. EXAM: CT ABDOMEN AND PELVIS WITH CONTRAST TECHNIQUE: Multidetector CT imaging of the abdomen and pelvis was performed using the standard protocol following bolus administration of intravenous contrast. CONTRAST:  32mL OMNIPAQUE IOHEXOL 300 MG/ML  SOLN COMPARISON:  MRI 02/03/2019.  CT 01/08/2019. FINDINGS: Lower chest: Normal Hepatobiliary: Diffuse fatty change of the liver. No focal lesion visible by CT. No calcified gallstones. Pancreas: Normal Spleen: Normal Adrenals/Urinary  Tract: Adrenal glands are normal. Left kidney shows some simple cysts and a few areas of focal cortical atrophy. Right kidney shows chronic atrophy with a few CIS and slightly prominent collecting system and ureter. Chronic findings without any acute change. Bladder is normal except for mild prolapse. Stomach/Bowel: Stomach and small intestine appear normal. Acute diverticulitis at the proximal sigmoid colon with regional inflammatory change and an adjacent 3 cm abscess. No free fluid or air. Vascular/Lymphatic: Aortic atherosclerosis. No aneurysm. IVC is normal. No retroperitoneal adenopathy. Reproductive: Previous hysterectomy.  No pelvic mass. Other: No free fluid or air. Musculoskeletal: Chronic lumbar degenerative changes including degenerative anterolisthesis at L4-5 measuring 4 mm. IMPRESSION: Acute diverticulitis of the proximal sigmoid colon. Regional inflammatory change including a 3 cm adjacent abscess. No free fluid or air. Chronic fatty liver. Chronic right renal atrophy. Mild bladder prolapse. Electronically Signed   By: Jan Fireman.D.  On: 06/30/2019 13:33    Labs:  CBC: Recent Labs    06/30/19 1038 07/01/19 0451  WBC 12.0* 10.2  HGB 16.3* 14.2  HCT 46.9* 43.2  PLT 235 208    COAGS: Recent Labs    07/01/19 0451  INR 1.3*  APTT 35    BMP: Recent Labs    06/30/19 1038 07/01/19 0451  NA 138 140  K 3.4* 3.6  CL 99 104  CO2 30 28  GLUCOSE 116* 95  BUN <5* <5*  CALCIUM 8.6* 8.1*  CREATININE 1.04* 0.97  GFRNONAA 55* 60*  GFRAA >60 >60    LIVER FUNCTION TESTS: Recent Labs    06/30/19 1038  BILITOT 0.3  AST 17  ALT 11  ALKPHOS 56  PROT 6.9  ALBUMIN 3.4*    TUMOR MARKERS: No results for input(s): AFPTM, CEA, CA199, CHROMGRNA in the last 8760 hours.  Assessment and Plan:  Brianna Briggs is a 69 y.o. female with past medical history significant for breast cancer who presented to the [facility] emergency department yesterday  with left lower quadrant abdominal pain with CT scan demonstrating a diverticular abscess with the left lower abdomen/pelvis.  As such, request made for attempted CT guided aspiration and/or drainage catheter placement for infection source control purposes.    Patient continues to endorse left lower quadrant abdominal pain.  She is otherwise without complaint.  Specifically, no fever or chills.  No chest pain or shortness of breath.  Risks and benefits CT-guided aspiration/drainage catheter placement was discussed with the patient including bleeding, infection, damage to adjacent structures, bowel perforation/fistula connection, and sepsis.  All of the patient's questions were answered, patient is agreeable to proceed. Consent signed and in chart.  Thank you for this interesting consult.  I greatly enjoyed meeting Brianna Briggs and look forward to participating in their care.  A copy of this report was sent to the requesting provider on this date.  Electronically Signed: Sandi Mariscal, MD 07/01/2019, 9:37 AM   I spent a total of 20 Minutes in face to face in clinical consultation, greater than 50% of which was counseling/coordinating care for CT guided aspiration/drain placement.

## 2019-07-01 NOTE — Procedures (Signed)
Pre procedural Dx: Diverticular Abscess Post procedural Dx: Same  Technically successful CT guided placed of a 10 Fr drainage catheter placement into the left lower abdominal/pelvic pericolonic abscess yielding 10 cc of purulent fluid.    A representative aspirated sample was capped and sent to the laboratory for analysis.    EBL: Trace Complications: None immediate  Ronny Bacon, MD Pager #: [phone]

## 2019-07-01 NOTE — Progress Notes (Signed)
Patient clinically stable post Abscess drain placement per Dr Pascal Lux, tolerated well. Vitals stable pre and post procedure. Awake/alert and oriented post procedure. Denies complaints at this time. Received Versed 2mg  along with Fentanyl 100 mcg IV for procedure.

## 2019-07-01 NOTE — Progress Notes (Signed)
[facility] SURGICAL PROGRESS NOTE (cpt [phone])  Hospital Day(s): 1.   Interval History: Patient seen and examined, no acute events or new complaints overnight. Patient reports she continues to have LLQ discomfort, relatively unchanged from presentation, denies fever, chills, nausea, or emesis. Labs are reassuring this morning, leukocytosis resolved, WBC 10.2K, renal function is normal, no electrolyte derangement. Plan for C guided percutaneous drainage this morning with IR.   Review of Systems:  Constitutional: denies fever, chills  HEENT: denies cough or congestion  Respiratory: denies any shortness of breath  Cardiovascular: denies chest pain or palpitations  Gastrointestinal: + abdominal pain, denied N/V, or diarrhea/and bowel function as per interval history Genitourinary: denies burning with urination or urinary frequency   Vital signs in last 24 hours: [min-max] current  Temp:  [98.1 F (36.7 C)-98.6 F (37 C)] 98.1 F (36.7 C) (06/15 0546) Pulse Rate:  [86-112] 90 (06/15 0546) Resp:  [17-20] 18 (06/15 0546) BP: (100-150)/(59-131) 100/59 (06/15 0546) SpO2:  [93 %-100 %] 95 % (06/15 0546) Weight:  [61.7 kg] 61.7 kg (06/14 1033)     Height: 5\' 2"  (157.5 cm) Weight: 61.7 kg BMI (Calculated): 24.87   Intake/Output last 2 shifts:  06/14 0701 - 06/15 0700 In: 1219.9 [I.V.:1128.8; IV Piggyback:91.2] Out: -    Physical Exam:  Constitutional: alert, cooperative and no distress  HENT: normocephalic without obvious abnormality  Eyes: PERRL, EOM's grossly intact and symmetric  Respiratory: breathing non-labored at rest  Cardiovascular: regular rate and sinus rhythm  Gastrointestinal: Soft, still with LLQ tenderness, and non-distended, no rebound/guarding Musculoskeletal: no edema or wounds, motor and sensation grossly intact, NT    Labs:  CBC Latest Ref Rng & Units 07/01/2019 06/30/2019  WBC 4.0 - 10.5 K/uL 10.2 12.0(H)  Hemoglobin 12.0 - 15.0 g/dL 14.2 16.3(H)   Hematocrit 36 - 46 % 43.2 46.9(H)  Platelets 150 - 400 K/uL 208 235   CMP Latest Ref Rng & Units 07/01/2019 06/30/2019  Glucose 70 - 99 mg/dL 95 116(H)  BUN 8 - 23 mg/dL <5(L) <5(L)  Creatinine 0.44 - 1.00 mg/dL 0.97 1.04(H)  Sodium 135 - 145 mmol/L 140 138  Potassium 3.5 - 5.1 mmol/L 3.6 3.4(L)  Chloride 98 - 111 mmol/L 104 99  CO2 22 - 32 mmol/L 28 30  Calcium 8.9 - 10.3 mg/dL 8.1(L) 8.6(L)  Total Protein 6.5 - 8.1 g/dL - 6.9  Total Bilirubin 0.3 - 1.2 mg/dL - 0.3  Alkaline Phos 38 - 126 U/L - 56  AST 15 - 41 U/L - 17  ALT 0 - 44 U/L - 11    Imaging studies: No new pertinent imaging studies   Assessment/Plan: (ICD-10's: K33.20) 69 y.o. female with persistent LLQ pain admitted with acute sigmoid diverticulitis with abscess which has failed outpatient managemet   - NPO except sips with medications             - IVF resuscitation             - IV ABx (Zosyn)  - Will plan for IR drainage today, discussed with Dr Pascal Lux yesterday             - Monitor abdominal examination; on-going bowel function             - Pain control prn; antiemetics prn    - Medical management of comorbid conditions; home medications              - DVT prophylaxis; hold for IR procedure  All of  the above findings and recommendations were discussed with the patient, and the medical team, and all of patient's questions were answered to her expressed satisfaction.  -- Edison Simon, PA-C [facility] 07/01/2019, 7:43 AM [phone] M-F: 7am - 4pm

## 2019-07-02 MED ORDER — IBUPROFEN 400 MG PO TABS
600.0000 mg | ORAL_TABLET | Freq: Four times a day (QID) | ORAL | Status: DC | PRN
  Administered 2019-07-02: 600 mg via ORAL
  Filled 2019-07-02: qty 2

## 2019-07-02 NOTE — Progress Notes (Signed)
[facility] SURGICAL PROGRESS NOTE (cpt [phone])  Hospital Day(s): 2.   Interval History: Patient seen and examined, no acute events or new complaints overnight. Patient reports she still has LLQ abdominal discomfort, mildly improved, mostly with movements, denies fever, chills, nausea, or emesis. No new labs or imaging. Underwent percutaneous drainage yesterday with IR. Tolerated diet advancement to FLD yesterday. She is having bowel function. Mobilizing well.    Review of Systems:  Constitutional: denies fever, chills  HEENT: denies cough or congestion  Respiratory: denies any shortness of breath  Cardiovascular: denies chest pain or palpitations  Gastrointestinal: + abdominal pain (improved), denied N/V, or diarrhea/and bowel function as per interval history Genitourinary: denies burning with urination or urinary frequency   Vital signs in last 24 hours: [min-max] current  Temp:  [97.9 F (36.6 C)-99.1 F (37.3 C)] 98.7 F (37.1 C) (06/16 0422) Pulse Rate:  [73-103] 89 (06/16 0422) Resp:  [12-20] 16 (06/16 0422) BP: (78-126)/(52-74) 100/63 (06/16 0422) SpO2:  [86 %-99 %] 93 % (06/16 0422)     Height: 5\' 2"  (157.5 cm) Weight: 61.7 kg BMI (Calculated): 24.87   Intake/Output last 2 shifts:  06/15 0701 - 06/16 0700 In: 1200.9 [P.O.:360; I.V.:794.8; IV Piggyback:46.1] Out: 310 [Urine:300; Drains:10]   Physical Exam:  Constitutional: alert, cooperative and no distress  HENT: normocephalic without obvious abnormality  Eyes: PERRL, EOM's grossly intact and symmetric  Respiratory: breathing non-labored at rest  Cardiovascular: regular rate and sinus rhythm  Gastrointestinal: Soft, still with LLQ tenderness, and non-distended, no rebound/guarding. Drain in the LLQ with serosanguinous drainage  Musculoskeletal: no edema or wounds, motor and sensation grossly intact, NT    Labs:  CBC Latest Ref Rng & Units 07/01/2019 06/30/2019  WBC 4.0 - 10.5 K/uL 10.2 12.0(H)   Hemoglobin 12.0 - 15.0 g/dL 14.2 16.3(H)  Hematocrit 36 - 46 % 43.2 46.9(H)  Platelets 150 - 400 K/uL 208 235   CMP Latest Ref Rng & Units 07/01/2019 06/30/2019  Glucose 70 - 99 mg/dL 95 116(H)  BUN 8 - 23 mg/dL <5(L) <5(L)  Creatinine 0.44 - 1.00 mg/dL 0.97 1.04(H)  Sodium 135 - 145 mmol/L 140 138  Potassium 3.5 - 5.1 mmol/L 3.6 3.4(L)  Chloride 98 - 111 mmol/L 104 99  CO2 22 - 32 mmol/L 28 30  Calcium 8.9 - 10.3 mg/dL 8.1(L) 8.6(L)  Total Protein 6.5 - 8.1 g/dL - 6.9  Total Bilirubin 0.3 - 1.2 mg/dL - 0.3  Alkaline Phos 38 - 126 U/L - 56  AST 15 - 41 U/L - 17  ALT 0 - 44 U/L - 11    Imaging studies: No new pertinent imaging studies   Assessment/Plan: (ICD-10's: K8.20) 69 y.o. female with improving abdominal pain admitted with acute sigmoid diverticulitis with abscess which has failed outpatient managemet              - Full liquid diet, okay to advance tonight -vs- tomorrow morning - IVF resuscitation; wean - IV ABx (Zosyn) - Monitor abdominal examination; on-going bowel function - Pain control prn; antiemetics prn              - Medical management of comorbid conditions; home medications  - No emergent surgical intervntion - DVT prophylaxis; okay to resume   - Discharge Planning: If tolerates diet advancement and still doing well clinically then hopefully home in 24-48 hours  All of the above findings and recommendations were discussed with the patient, and the medical team, and all of patient's questions  were answered to her expressed satisfaction.  -- Edison Simon, PA-C [facility] 07/02/2019, 7:15 AM [phone] M-F: 7am - 4pm

## 2019-07-03 LAB — BASIC METABOLIC PANEL
Anion gap: 7 (ref 5–15)
BUN: <5 mg/dL — ABNORMAL LOW (ref 8–23)
CO2: 28 mmol/L (ref 22–32)
Calcium: 8.2 mg/dL — ABNORMAL LOW (ref 8.9–10.3)
Chloride: 103 mmol/L (ref 98–111)
Creatinine, Ser: 0.87 mg/dL (ref 0.44–1.00)
GFR calc Af Amer: >60 mL/min (ref >60)
GFR calc non Af Amer: >60 mL/min (ref >60)
Glucose, Bld: 77 mg/dL (ref 70–99)
Potassium: 3.8 mmol/L (ref 3.5–5.1)
Sodium: 138 mmol/L (ref 135–145)

## 2019-07-03 LAB — CBC
HCT: 40.8 % (ref 36.0–46.0)
Hemoglobin: 13.8 g/dL (ref 12.0–15.0)
MCH: 31 pg (ref 26.0–34.0)
MCHC: 33.8 g/dL (ref 30.0–36.0)
MCV: 91.7 fL (ref 80.0–100.0)
Platelets: 196 10*3/uL (ref 150–400)
RBC: 4.45 MIL/uL (ref 3.87–5.11)
RDW: 13.6 % (ref 11.5–15.5)
WBC: 12.5 10*3/uL — ABNORMAL HIGH (ref 4.0–10.5)
nRBC: 0 % (ref 0.0–0.2)

## 2019-07-03 MED ORDER — ENOXAPARIN SODIUM 40 MG/0.4ML ~~LOC~~ SOLN
40.0000 mg | SUBCUTANEOUS | Status: DC
  Administered 2019-07-03 – 2019-07-04 (×2): 40 mg via SUBCUTANEOUS
  Filled 2019-07-03 (×2): qty 0.4

## 2019-07-03 NOTE — Care Management Important Message (Signed)
Important Message  Patient Details  Name: Brianna Briggs MRN: [id number] Date of Birth: Nov 29, 1950   Medicare Important Message Given:  Yes     [name] 07/03/2019, 2:07 PM

## 2019-07-03 NOTE — Progress Notes (Signed)
[facility] SURGICAL PROGRESS NOTE (cpt [phone])  Hospital Day(s): 3.   Interval History: Patient seen and examined, no acute events or new complaints overnight. Patient reports she still doesn't feel too well. She is still having LLQ and suprapubic discomfort, I suspect this is primarily at drain site as it is exacerbated with coughing and moving, denies fever, chills, nausea, or emesis. Slight leukocytosis this morning to 12.5K, no fevers but she has been borderline tachycardic to 103. BMP is reassuring. Drain output not recorded for the last 24 hours. She has been tolerating full liquids and having bowel function. No new complaints.  Review of Systems:  Constitutional: denies fever, chills  HEENT: denies cough or congestion  Respiratory: denies any shortness of breath  Cardiovascular: denies chest pain or palpitations  Gastrointestinal: + abdominal pain, denied N/V, or diarrhea/and bowel function as per interval history Genitourinary: denies burning with urination or urinary frequency   Vital signs in last 24 hours: [min-max] current  Temp:  [97.5 F (36.4 C)-98.1 F (36.7 C)] 97.7 F (36.5 C) (06/17 0519) Pulse Rate:  [83-103] 103 (06/17 0519) Resp:  [16] 16 (06/17 0519) BP: (91-110)/(56-90) 110/90 (06/17 0519) SpO2:  [91 %-92 %] 91 % (06/17 0519)     Height: 5\' 2"  (157.5 cm) Weight: 61.7 kg BMI (Calculated): 24.87   Intake/Output last 2 shifts:  06/16 0701 - 06/17 0700 In: 480 [P.O.:480] Out: 1050 [Urine:1050]   Physical Exam:  Constitutional: alert, cooperative and no distress  HENT: normocephalic without obvious abnormality  Eyes: PERRL, EOM's grossly intact and symmetric  Respiratory: breathing non-labored at rest  Cardiovascular: regular rate and sinus rhythm  Gastrointestinal:Soft,still with LLQ tenderness worse at drain site, and non-distended, no rebound/guarding. Drain in the LLQ with serosanguinous drainage  Musculoskeletal: no edema or wounds,  motor and sensation grossly intact, NT    Labs:  CBC Latest Ref Rng & Units 07/03/2019 07/01/2019 06/30/2019  WBC 4.0 - 10.5 K/uL 12.5(H) 10.2 12.0(H)  Hemoglobin 12.0 - 15.0 g/dL 13.8 14.2 16.3(H)  Hematocrit 36 - 46 % 40.8 43.2 46.9(H)  Platelets 150 - 400 K/uL 196 208 235   CMP Latest Ref Rng & Units 07/03/2019 07/01/2019 06/30/2019  Glucose 70 - 99 mg/dL 77 95 116(H)  BUN 8 - 23 mg/dL <5(L) <5(L) <5(L)  Creatinine 0.44 - 1.00 mg/dL 0.87 0.97 1.04(H)  Sodium 135 - 145 mmol/L 138 140 138  Potassium 3.5 - 5.1 mmol/L 3.8 3.6 3.4(L)  Chloride 98 - 111 mmol/L 103 104 99  CO2 22 - 32 mmol/L 28 28 30   Calcium 8.9 - 10.3 mg/dL 8.2(L) 8.1(L) 8.6(L)  Total Protein 6.5 - 8.1 g/dL - - 6.9  Total Bilirubin 0.3 - 1.2 mg/dL - - 0.3  Alkaline Phos 38 - 126 U/L - - 56  AST 15 - 41 U/L - - 17  ALT 0 - 44 U/L - - 11     Imaging studies: No new pertinent imaging studies   Assessment/Plan: (ICD-10's: K49.20) 69 y.o. female with persistent abdominal pain and mild leukoctosis admitted withacute sigmoid diverticulitis with abscess which has failed outpatient managemet   - I will advance to soft diet this morning   - IV ABx (Zosyn)  - Follow up Cx; growing enterococcus avium; susceptibilities pending   - Continue drain; monitor output; flush daily  - Monitor abdominal examination; on-going bowel function - Pain control prn; antiemetics prn - Medical management of comorbid conditions; home medications             -  No emergent surgical intervntion - DVT prophylaxis   - Discharge Planning: Hopefully home in 24-48 hours, ? May benefit from CT prior to discharge to ensure improvement  All of the above findings and recommendations were discussed with the patient, and the medical team, and all of patient's questions were answered to her expressed satisfaction.  -- Edison Simon, PA-C [facility] 07/03/2019, 7:23  AM [phone] M-F: 7am - 4pm

## 2019-07-03 NOTE — Clinical Social Work Note (Signed)
CSW acknowledges consult for medication assistance. Patient has Medicare and a BCBS supplement. Patient reports that [place] which her dermatologist prescribed for her Eczema is over $500. Surgery PA and pharmacist agreed that patient will need to follow up with her dermatologist to see if there are any cheaper options. CSW looked up GoodRx coupon but prices are between $600-$700. Patient has been updated.  Dayton Scrape, [place]

## 2019-07-04 LAB — CBC
HCT: 37.7 % (ref 36.0–46.0)
Hemoglobin: 12.9 g/dL (ref 12.0–15.0)
MCH: 30.6 pg (ref 26.0–34.0)
MCHC: 34.2 g/dL (ref 30.0–36.0)
MCV: 89.5 fL (ref 80.0–100.0)
Platelets: 200 10*3/uL (ref 150–400)
RBC: 4.21 MIL/uL (ref 3.87–5.11)
RDW: 13.5 % (ref 11.5–15.5)
WBC: 11.1 10*3/uL — ABNORMAL HIGH (ref 4.0–10.5)
nRBC: 0 % (ref 0.0–0.2)

## 2019-07-04 MED ORDER — OXYCODONE HCL 5 MG PO TABS: 5.0000 mg | ORAL_TABLET | Freq: Four times a day (QID) | ORAL | 0 refills | Status: DC | PRN

## 2019-07-04 MED ORDER — IBUPROFEN 600 MG PO TABS: 600.0000 mg | ORAL_TABLET | Freq: Four times a day (QID) | ORAL | 0 refills | Status: DC | PRN

## 2019-07-04 MED ORDER — AMOXICILLIN-POT CLAVULANATE 875-125 MG PO TABS
1.0000 | ORAL_TABLET | Freq: Two times a day (BID) | ORAL | 0 refills | Status: AC
Start: 2019-07-04 — End: 2019-07-14

## 2019-07-04 MED ORDER — OMEPRAZOLE 20 MG PO CPDR: 20.0000 mg | DELAYED_RELEASE_CAPSULE | Freq: Two times a day (BID) | ORAL | 1 refills | Status: DC

## 2019-07-04 NOTE — Progress Notes (Signed)
Brianna Briggs to be D/C'd home with husband per MD order.  Discussed prescriptions and follow up appointments with the patient. Prescriptions given to patient, medication list explained in detail. Pt verbalized understanding.  Allergies as of 07/04/2019       Reactions   Soy Protein  Claudette Head Oil] Rash   Wheat Bran Rash   Amoxicillin-pot Clavulanate Diarrhea   Other reaction(s): Unknown   Biaxin [clarithromycin] Diarrhea   Other reaction(s): Unknown   Oxycodone Itching   Codeine Rash   Nitrofurantoin Rash   Soy Allergy Rash        Medication List     STOP taking these medications    ciprofloxacin 500 MG tablet Commonly known as: CIPRO   metroNIDAZOLE 500 MG tablet Commonly known as: FLAGYL   rosuvastatin 20 MG tablet Commonly known as: CRESTOR       TAKE these medications    amoxicillin-clavulanate 875-125 MG tablet Commonly known as: Augmentin Take 1 tablet by mouth 2 (two) times daily for 10 days.   dicyclomine 10 MG/5ML solution Commonly known as: BENTYL Take 10 mg by mouth 4 (four) times daily -  before meals and at bedtime.   Eucrisa 2 % Oint Generic drug: Crisaborole Apply 1 application topically 2 (two) times daily.   fluticasone 50 MCG/ACT nasal spray Commonly known as: FLONASE Place 2 sprays into both nostrils daily.   ibuprofen 600 MG tablet Commonly known as: ADVIL Take 1 tablet (600 mg total) by mouth every 6 (six) hours as needed for fever, headache, mild pain or moderate pain.   mometasone 0.1 % cream Commonly known as: ELOCON Apply 1 application topically daily as needed (Rash). Apply bid x 2 weeks then decrease to qd up to 5 days per week   omeprazole 20 MG capsule Commonly known as: PRILOSEC Take 1 capsule (20 mg total) by mouth 2 (two) times daily before a meal.   ondansetron 4 MG disintegrating tablet Commonly known as: ZOFRAN-ODT Take 4 mg by mouth every 8 (eight) hours as needed for nausea.   oxyCODONE 5 MG immediate  release tablet Commonly known as: Oxy IR/ROXICODONE Take 1 tablet (5 mg total) by mouth every 6 (six) hours as needed for severe pain or breakthrough pain.   triamcinolone cream 0.1 % Commonly known as: KENALOG Apply 1 application topically 2 (two) times daily.        Vitals:   07/03/19 1926 07/04/19 0424  BP: 90/66 118/75  Pulse: 98 (!) 103  Resp: 16 16  Temp: 99.4 F (37.4 C) 99.9 F (37.7 C)  SpO2: 94% 95%    Skin clean, dry and intact without evidence of skin break down, no evidence of skin tears noted. IV catheter discontinued intact. Site without signs and symptoms of complications. Dressing and pressure applied. Pt denies pain at this time. No complaints noted.  An After Visit Summary was printed and given to the patient. Patient escorted via [place], and D/C home via private auto.  Bigelow A Brianna Briggs

## 2019-07-04 NOTE — Discharge Summary (Signed)
[facility] SURGICAL DISCHARGE SUMMARY (cpt: [phone])  Patient ID: Brianna Briggs MRN: [id number] DOB/AGE: 09/27/1950 69 y.o.  Admit date: 06/30/2019 Discharge date: 07/04/2019  Discharge Diagnoses Patient Active Problem List   Diagnosis Date Noted  . Diverticulitis of intestine with abscess without bleeding 06/30/2019  . Chronic eczema 08/30/2015  . Health care maintenance 03/01/2015  . Simple chronic bronchitis ([place]) 03/01/2015  . Shortness of breath 12/16/2014  . Hyperlipidemia, mixed 06/04/2014  . Hypercholesterolemia 02/26/2014  . PVC (premature ventricular contraction) 09/04/2013  . Tobacco abuse 09/04/2013    Consultants Interventional Radiology for Percutaneous Drainage   Procedures None  HPI: 69 y.o. female presented to [facility] ED today for abdominal pain. Patient reports that she has had LLQ abdominal pain since around 06/05. This has been in her LLQ. She described this as a sharp pain. She called her PCP about this and they started her on Cipro/Flagyl on Tuesday of last week. She has a history of diverticulitis and this feels similar to that episode. She endorses associated chill, nausea, and dry heaves with the pain. No recorded fever, cough, congestion, CP, SOB, urinary changes, or bowel changes. Last colonoscopy in April of this year with [facility] but I am unable to review the report in Care everywhere. Previous abdominal surgeries positive for abdominal hysterectomy and laparoscopic tubal ligation. Work up in the ED concerning for leukocytosis with WBC to 12.0K, mild hypokalemia to 3.4, sCr - 1.04 (unsure of baseline), and CT Abdomen/Pelvis was concerning for sigmoid diverticulitis with abscess.   Hospital Course: Patient was admitted to the general surgery service and underwent percutaneous drainage with IR on 06/15. Cultures from this grew pan-susceptible enterococcus. Her leukocytosis gradually improved and her pain also improved of the  course of her hospitalization. Advancement of patient's diet was reasonably tolerated to her baseline and ambulation was never an issue. The remainder of patient's hospital course was essentially unremarkable, and discharge planning was initiated accordingly with patient safely able to be discharged home with appropriate discharge instructions, antibiotics (Augmentin x10 days), pain control, and outpatient follow-up after all of her and her family's questions were answered to their expressed satisfaction.   Discharge Condition: Good   Physical Examination:  Constitutional: alert, cooperative and no distress  HENT: normocephalic without obvious abnormality  Eyes: PERRL, EOM's grossly intact and symmetric  Respiratory: breathing non-labored at rest  Cardiovascular: regular rate and sinus rhythm  Gastrointestinal:Soft, tenderness only over drain site, and non-distended, no rebound/guarding.Drain in the LLQ with serosanguinous drainage Musculoskeletal: no edema or wounds, motor and sensation grossly intact, NT   Allergies as of 07/04/2019      Reactions   Soy Protein  Claudette Head Oil] Rash   Wheat Bran Rash   Amoxicillin-pot Clavulanate Diarrhea   Other reaction(s): Unknown   Biaxin [clarithromycin] Diarrhea   Other reaction(s): Unknown   Oxycodone Itching   Codeine Rash   Nitrofurantoin Rash   Soy Allergy Rash      Medication List    STOP taking these medications   ciprofloxacin 500 MG tablet Commonly known as: CIPRO   metroNIDAZOLE 500 MG tablet Commonly known as: FLAGYL   rosuvastatin 20 MG tablet Commonly known as: CRESTOR     TAKE these medications   amoxicillin-clavulanate 875-125 MG tablet Commonly known as: Augmentin Take 1 tablet by mouth 2 (two) times daily for 10 days.   dicyclomine 10 MG/5ML solution Commonly known as: BENTYL Take 10 mg by mouth 4 (four) times daily -  before meals and  at bedtime.   Eucrisa 2 % Oint Generic drug: Crisaborole Apply 1  application topically 2 (two) times daily.   fluticasone 50 MCG/ACT nasal spray Commonly known as: FLONASE Place 2 sprays into both nostrils daily.   ibuprofen 600 MG tablet Commonly known as: ADVIL Take 1 tablet (600 mg total) by mouth every 6 (six) hours as needed for fever, headache, mild pain or moderate pain.   mometasone 0.1 % cream Commonly known as: ELOCON Apply 1 application topically daily as needed (Rash). Apply bid x 2 weeks then decrease to qd up to 5 days per week   omeprazole 20 MG capsule Commonly known as: PRILOSEC Take 1 capsule (20 mg total) by mouth 2 (two) times daily before a meal.   ondansetron 4 MG disintegrating tablet Commonly known as: ZOFRAN-ODT Take 4 mg by mouth every 8 (eight) hours as needed for nausea.   oxyCODONE 5 MG immediate release tablet Commonly known as: Oxy IR/ROXICODONE Take 1 tablet (5 mg total) by mouth every 6 (six) hours as needed for severe pain or breakthrough pain.   triamcinolone cream 0.1 % Commonly known as: KENALOG Apply 1 application topically 2 (two) times daily.         Follow-up Information    Ronny Bacon, MD. Schedule an appointment as soon as possible for a visit in 1 week(s).   Specialty: General Surgery Why: 1 week follow up for hospitalization for diverticulitis, has a drain. Okay to see Thedore Mins PA on tuesday or [place] on thursday if he has Immunologist information: [address] [place] [phone]                Time spent on discharge management including discussion of hospital course, clinical condition, outpatient instructions, prescriptions, and follow up with the patient and members of the medical team: >30 minutes  -- Edison Simon , PA-C [facility]  07/04/2019, 10:19 AM [phone] M-F: 7am - 4pm

## 2019-07-06 LAB — AEROBIC/ANAEROBIC CULTURE W GRAM STAIN (SURGICAL/DEEP WOUND): Special Requests: NORMAL

## 2019-07-07 ENCOUNTER — Telehealth: Payer: Self-pay | Admitting: Surgery

## 2019-07-07 NOTE — Telephone Encounter (Signed)
Called patient back, she states the rash is concentrated around her neck, shoulders, and back area. Denies f, chills, N, V. Pt advised to take Benadryl for itching.  Per Dr Christian Mate pt may stop abx. Pt to f/u with [facility] 6/23. Pt made aware of this. No further concerns.

## 2019-07-07 NOTE — Telephone Encounter (Signed)
Patient is calling and said the medication Augmentin she is breaking out in a rash. Patients just has some questions and is asking if someone could give her a call. Please call patient and advise.

## 2019-07-09 ENCOUNTER — Other Ambulatory Visit: Payer: Self-pay

## 2019-07-09 ENCOUNTER — Encounter: Payer: Self-pay | Admitting: Physician Assistant

## 2019-07-09 ENCOUNTER — Ambulatory Visit (INDEPENDENT_AMBULATORY_CARE_PROVIDER_SITE_OTHER): Payer: Medicare Other | Admitting: Physician Assistant

## 2019-07-09 ENCOUNTER — Ambulatory Visit
Admission: RE | Admit: 2019-07-09 | Discharge: 2019-07-09 | Disposition: A | Discharge status: Home / Self Care | Payer: Medicare Other | Source: Ambulatory Visit | Attending: Physician Assistant | Admitting: Physician Assistant

## 2019-07-09 ENCOUNTER — Encounter: Payer: Medicare Other | Admitting: Physician Assistant

## 2019-07-09 VITALS — BP 108/73 | HR 102 | Temp 97.5°F | Ht 62.0 in | Wt 133.6 lb

## 2019-07-09 DIAGNOSIS — K5720 Diverticulitis of large intestine with perforation and abscess without bleeding: Secondary | ICD-10-CM | POA: Diagnosis not present

## 2019-07-09 DIAGNOSIS — R109 Unspecified abdominal pain: Secondary | ICD-10-CM | POA: Insufficient documentation

## 2019-07-09 MED ORDER — IOHEXOL 300 MG/ML  SOLN
100.0000 mL | Freq: Once | INTRAMUSCULAR | Status: AC | PRN
  Administered 2019-07-09: 100 mL via INTRAVENOUS

## 2019-07-09 NOTE — Progress Notes (Signed)
[facility] SURGICAL CLINIC NOTE  07/09/2019  History of Present Illness: Brianna Briggs is a 69 y.o. female recently admitted to our service from 06/14 - 09/81 for acute complicated diverticulitis with abscess who underwent percutaneous drainage on 06/15 with IR. She did well the remainder of her hospitalization and was discharge home with Augmentin. She did develop a rash with this and ABx were stopped all together as she had completed >7 days total.   Today, she is reported that she overall feels much improved compared to presentation. She has intermittent soreness at her drain site but overall feels better than the previous days. Nausea seems to have improved some since starting PPI. No fever, chills, emesis, or bowel changes. Appetite has not improved much. Her drainage is about <10 mls a day however there is still grossly purulent drainage in the tubing. She continues to flush this with 3 ccs daily. No other complaints.    Past Medical History: Past Medical History:  Diagnosis Date  . Allergic genetic state   . Breast cancer, right ([place]) 1996   Mastectomy  . History of chicken pox   . History of colon polyps   . Hyperlipidemia   . Single kidney      Past Surgical History: Past Surgical History:  Procedure Laterality Date  . ABDOMINAL HYSTERECTOMY    . COLONOSCOPY WITH PROPOFOL N/A 05/01/2019   Procedure: COLONOSCOPY WITH PROPOFOL;  Surgeon: Toledo, Benay Pike, MD;  Location: [facility] ENDOSCOPY;  Service: Gastroenterology;  Laterality: N/A;  . ESOPHAGOGASTRODUODENOSCOPY (EGD) WITH PROPOFOL N/A 05/01/2019   Procedure: ESOPHAGOGASTRODUODENOSCOPY (EGD) WITH PROPOFOL;  Surgeon: Toledo, Benay Pike, MD;  Location: [facility] ENDOSCOPY;  Service: Gastroenterology;  Laterality: N/A;  . MASTECTOMY    . SHOULDER ARTHROSCOPY WITH SUBACROMIAL DECOMPRESSION, ROTATOR CUFF REPAIR AND BICEP TENDON REPAIR Left 01/13/2008  . TONSILLECTOMY    . TUBAL LIGATION      Home  Medications: Prior to Admission medications   Medication Sig Start Date End Date Taking? Authorizing Provider  omeprazole (PRILOSEC) 20 MG capsule Take 1 capsule (20 mg total) by mouth 2 (two) times daily before a meal. 07/04/19  Yes Tylene Fantasia, PA-C  amoxicillin-clavulanate (AUGMENTIN) 875-125 MG tablet Take 1 tablet by mouth 2 (two) times daily for 10 days. Patient not taking: Reported on 07/09/2019 07/04/19 07/14/19  Tylene Fantasia, PA-C  Crisaborole (EUCRISA) 2 % OINT Apply 1 application topically 2 (two) times daily. Patient not taking: Reported on 05/01/2019 04/22/19   Ralene Bathe, MD  dicyclomine (BENTYL) 10 MG/5ML solution Take 10 mg by mouth 4 (four) times daily -  before meals and at bedtime. Patient not taking: Reported on 06/30/2019    [provider]  fluticasone (FLONASE) 50 MCG/ACT nasal spray Place 2 sprays into both nostrils daily. Patient not taking: Reported on 07/09/2019 05/02/19   [provider]  ibuprofen (ADVIL) 600 MG tablet Take 1 tablet (600 mg total) by mouth every 6 (six) hours as needed for fever, headache, mild pain or moderate pain. Patient not taking: Reported on 07/09/2019 07/04/19   Tylene Fantasia, PA-C  mometasone (ELOCON) 0.1 % cream Apply 1 application topically daily as needed (Rash). Apply bid x 2 weeks then decrease to qd up to 5 days per week Patient not taking: Reported on 07/09/2019 04/22/19   Ralene Bathe, MD  ondansetron (ZOFRAN-ODT) 4 MG disintegrating tablet Take 4 mg by mouth every 8 (eight) hours as needed for nausea. Patient not taking: Reported on 07/09/2019 06/23/19  [provider]  oxyCODONE (OXY IR/ROXICODONE) 5 MG immediate release tablet Take 1 tablet (5 mg total) by mouth every 6 (six) hours as needed for severe pain or breakthrough pain. Patient not taking: Reported on 07/09/2019 07/04/19   Tylene Fantasia, PA-C  triamcinolone cream (KENALOG) 0.1 % Apply 1 application topically 2 (two) times  daily. Patient not taking: Reported on 06/30/2019    [provider]    Allergies: Allergies  Allergen Reactions  . Soy Protein  [Soybean Oil] Rash  . Wheat Bran Rash  . Amoxicillin-Pot Clavulanate Diarrhea    Other reaction(s): Unknown  . Biaxin [Clarithromycin] Diarrhea    Other reaction(s): Unknown  . Oxycodone Itching  . Codeine Rash  . Nitrofurantoin Rash  . Soy Allergy Rash    Review of Systems: Review of Systems  Constitutional: Positive for malaise/fatigue. Negative for chills and fever.       + Decreased appetite (unchanged)  HENT: Negative for congestion and sore throat.   Respiratory: Negative for cough and shortness of breath.   Cardiovascular: Negative for chest pain and palpitations.  Gastrointestinal: Positive for abdominal pain (Improved) and nausea (Improved). Negative for vomiting.  Genitourinary: Negative for dysuria and urgency.  All other systems reviewed and are negative.   Physical Exam BP 108/73   Pulse (!) 102   Temp (!) 97.5 F (36.4 C) (Temporal)   Ht 5\' 2"  (1.575 m)   Wt 133 lb 9.6 oz (60.6 kg)   SpO2 90%   BMI 24.44 kg/m   Physical Exam Vitals and nursing note reviewed.  Constitutional:      General: She is not in acute distress.    Appearance: Normal appearance. She is normal weight. She is not ill-appearing.  HENT:     Head: Normocephalic and atraumatic.  Eyes:     Conjunctiva/sclera: Conjunctivae normal.     Pupils: Pupils are equal, round, and reactive to light.  Cardiovascular:     Rate and Rhythm: Regular rhythm. Tachycardia present.     Pulses: Normal pulses.     Heart sounds: No murmur heard.   Pulmonary:     Effort: Pulmonary effort is normal. No respiratory distress.     Breath sounds: Normal breath sounds.  Abdominal:     General: There is no distension.     Tenderness: There is no abdominal tenderness. There is no guarding or rebound.     Comments: Percutaneous drain in the LLQ, purulent drainage in the  tubing, the bag appears to have more serosanguinous output  Genitourinary:    Comments: Deferred Musculoskeletal:     Right lower leg: No edema.     Left lower leg: No edema.  Skin:    General: Skin is warm and dry.     Coloration: Skin is not pale.     Findings: No erythema.  Neurological:     General: No focal deficit present.     Mental Status: She is alert and oriented to person, place, and time.  Psychiatric:        Mood and Affect: Mood is depressed.        Behavior: Behavior normal.     Labs/Imaging: No new pertinent labs or imaging    Assessment and Plan: This is a 69 y.o. female with acute sigmoid diverticulitis with abscess s/p percutaneous drainage.    - I will obtain CT Abdomen/Pelvis to reassess for interval improvement and resolution of the abscess prior to consideration of removal. If there is still concern  for abscess on this repeat imagine, we can consider specific drain study in 1 week to assess for resolution and rule out fistula  - No need for additional pain medication  - No need for additional ABx  - Continue drain; flush daily; monitor and record output  - rtc in 1 week follow CT Scan   Face-to-face time spent with the patient and care providers was 25 minutes, with more than 50% of the time spent counseling, educating, and coordinating care of the patient.     Edison Simon, PA-C [facility] 07/09/2019, 2:24 PM [phone] M-F: 7am - 4pm

## 2019-07-09 NOTE — Patient Instructions (Addendum)
Patient will have STAT CT Abdomen Pelvis with Contrast today at [facility].   Patient will follow up with Otho Ket, PA Tuesday June 29th at 1:30P.   Wound Care, Adult Taking care of your wound properly can help to prevent pain, infection, and scarring. It can also help your wound to heal more quickly. How to care for your wound Wound care      Follow instructions from your health care provider about how to take care of your wound. Make sure you: ? Wash your hands with soap and water before you change the bandage (dressing). If soap and water are not available, use hand sanitizer. ? Change your dressing as told by your health care provider. ? Leave stitches (sutures), skin glue, or adhesive strips in place. These skin closures may need to stay in place for 2 weeks or longer. If adhesive strip edges start to loosen and curl up, you may trim the loose edges. Do not remove adhesive strips completely unless your health care provider tells you to do that.  Check your wound area every day for signs of infection. Check for: ? Redness, swelling, or pain. ? Fluid or blood. ? Warmth. ? Pus or a bad smell.  Ask your health care provider if you should clean the wound with mild soap and water. Doing this may include: ? Using a clean towel to pat the wound dry after cleaning it. Do not rub or scrub the wound. ? Applying a cream or ointment. Do this only as told by your health care provider. ? Covering the incision with a clean dressing.  Ask your health care provider when you can leave the wound uncovered.  Keep the dressing dry until your health care provider says it can be removed. Do not take baths, swim, use a hot tub, or do anything that would put the wound underwater until your health care provider approves. Ask your health care provider if you can take showers. You may only be allowed to take sponge baths. Medicines   If you were prescribed an antibiotic medicine, cream, or  ointment, take or use the antibiotic as told by your health care provider. Do not stop taking or using the antibiotic even if your condition improves.  Take over-the-counter and prescription medicines only as told by your health care provider. If you were prescribed pain medicine, take it 30 or more minutes before you do any wound care or as told by your health care provider. General instructions  Return to your normal activities as told by your health care provider. Ask your health care provider what activities are safe.  Do not scratch or pick at the wound.  Do not use any products that contain nicotine or tobacco, such as cigarettes and e-cigarettes. These may delay wound healing. If you need help quitting, ask your health care provider.  Keep all follow-up visits as told by your health care provider. This is important.  Eat a diet that includes protein, vitamin A, vitamin C, and other nutrient-rich foods to help the wound heal. ? Foods rich in protein include meat, dairy, beans, nuts, and other sources. ? Foods rich in vitamin A include carrots and dark green, leafy vegetables. ? Foods rich in vitamin C include citrus, tomatoes, and other fruits and vegetables. ? Nutrient-rich foods have protein, carbohydrates, fat, vitamins, or minerals. Eat a variety of healthy foods including vegetables, fruits, and whole grains. Contact a health care provider if:  You received a tetanus shot and  you have swelling, severe pain, redness, or bleeding at the injection site.  Your pain is not controlled with medicine.  You have redness, swelling, or pain around the wound.  You have fluid or blood coming from the wound.  Your wound feels warm to the touch.  You have pus or a bad smell coming from the wound.  You have a fever or chills.  You are nauseous or you vomit.  You are dizzy. Get help right away if:  You have a red streak going away from your wound.  The edges of the wound open up  and separate.  Your wound is bleeding, and the bleeding does not stop with gentle pressure.  You have a rash.  You faint.  You have trouble breathing. Summary  Always wash your hands with soap and water before changing your bandage (dressing).  To help with healing, eat foods that are rich in protein, vitamin A, vitamin C, and other nutrients.  Check your wound every day for signs of infection. Contact your health care provider if you suspect that your wound is infected. This information is not intended to replace advice given to you by your health care provider. Make sure you discuss any questions you have with your health care provider. Document Revised: 04/22/2018 Document Reviewed: 07/20/2015 Elsevier Patient Education  [place].

## 2019-07-11 ENCOUNTER — Telehealth: Payer: Self-pay | Admitting: *Deleted

## 2019-07-11 ENCOUNTER — Telehealth: Payer: Self-pay

## 2019-07-11 NOTE — Telephone Encounter (Signed)
Patient called and stated that she was seen by Thedore Mins on 07/09/19 and he flushed her drain but she stated that now its seems to be leaking and it looks infected with some tenderness. Please call and advise

## 2019-07-11 NOTE — Telephone Encounter (Signed)
Spoke patient to notify her that she may have pus in her tube is the yellow discoloration she may notice. Instructed patient to have her husband untwist the tube from the white piece and flush the tube with the saline and allow the pus to go in the patient's bag. Then husband may twist the tube back into the white tube per Otho Ket, PA. Patient states her husband was not there and she was confused advised patient to have her husband to contact our office if he would be back before 3pm and ask to speak to the nurse. Patient verbalized understanding and has no further questions. Patient was also made aware that per Thedore Mins she would more than likely have her drain removed. Patient was also made aware that her CT results per Thedore Mins was normal.

## 2019-07-14 NOTE — Telephone Encounter (Signed)
Error

## 2019-07-15 ENCOUNTER — Ambulatory Visit (INDEPENDENT_AMBULATORY_CARE_PROVIDER_SITE_OTHER): Payer: Medicare Other | Admitting: Physician Assistant

## 2019-07-15 ENCOUNTER — Encounter: Payer: Self-pay | Admitting: Physician Assistant

## 2019-07-15 ENCOUNTER — Other Ambulatory Visit: Payer: Self-pay

## 2019-07-15 VITALS — BP 98/63 | HR 111 | Temp 97.3°F | Resp 14 | Ht 62.0 in | Wt 132.8 lb

## 2019-07-15 DIAGNOSIS — K5720 Diverticulitis of large intestine with perforation and abscess without bleeding: Secondary | ICD-10-CM

## 2019-07-15 NOTE — Progress Notes (Signed)
[facility] NOTE  07/15/2019  History of Present Illness: Brianna Briggs is a 69 y.o. female recently admitted to our service from 06/14 - 53/97 for acute complicated diverticulitis with abscess who underwent percutaneous drainage on 06/15 with IR. She did well the remainder of her hospitalization and was discharge home with Augmentin. She did develop a rash with this and ABx were stopped all together as she had completed >7 days total.   Since the last visit she underwent CT Abdomen/Pelvis which showed interval improvement in diverticulitis and resolution of abscess. Her drain has unfortunately continued to have purulent output despite improvement seen on CT. She denied any fever, chills, nausea, or emesis. Her abdominal discomfort has improved some, but she notices occasional burning in her LLQ. Her appetite has improved some since taking the PPI. She is still very tired and anxious regarding getting better from this insult. No other new complaints or issues.    Past Medical History: Past Medical History:  Diagnosis Date  . Allergic genetic state   . Breast cancer, right ([place]) 1996   Mastectomy  . History of chicken pox   . History of colon polyps   . Hyperlipidemia   . Single kidney      Past Surgical History: Past Surgical History:  Procedure Laterality Date  . ABDOMINAL HYSTERECTOMY    . COLONOSCOPY WITH PROPOFOL N/A 05/01/2019   Procedure: COLONOSCOPY WITH PROPOFOL;  Surgeon: Toledo, Benay Pike, MD;  Location: [facility] ENDOSCOPY;  Service: Gastroenterology;  Laterality: N/A;  . ESOPHAGOGASTRODUODENOSCOPY (EGD) WITH PROPOFOL N/A 05/01/2019   Procedure: ESOPHAGOGASTRODUODENOSCOPY (EGD) WITH PROPOFOL;  Surgeon: Toledo, Benay Pike, MD;  Location: [facility] ENDOSCOPY;  Service: Gastroenterology;  Laterality: N/A;  . MASTECTOMY    . SHOULDER ARTHROSCOPY WITH SUBACROMIAL DECOMPRESSION, ROTATOR CUFF REPAIR AND BICEP TENDON REPAIR Left 01/13/2008  . TONSILLECTOMY     . TUBAL LIGATION      Home Medications: Prior to Admission medications   Medication Sig Start Date End Date Taking? Authorizing Provider  Crisaborole (EUCRISA) 2 % OINT Apply 1 application topically 2 (two) times daily. Patient not taking: Reported on 05/01/2019 04/22/19   Ralene Bathe, MD  dicyclomine (BENTYL) 10 MG/5ML solution Take 10 mg by mouth 4 (four) times daily -  before meals and at bedtime. Patient not taking: Reported on 06/30/2019    [provider]  fluticasone (FLONASE) 50 MCG/ACT nasal spray Place 2 sprays into both nostrils daily. Patient not taking: Reported on 07/09/2019 05/02/19   [provider]  ibuprofen (ADVIL) 600 MG tablet Take 1 tablet (600 mg total) by mouth every 6 (six) hours as needed for fever, headache, mild pain or moderate pain. Patient not taking: Reported on 07/09/2019 07/04/19   Tylene Fantasia, PA-C  mometasone (ELOCON) 0.1 % cream Apply 1 application topically daily as needed (Rash). Apply bid x 2 weeks then decrease to qd up to 5 days per week Patient not taking: Reported on 07/09/2019 04/22/19   Ralene Bathe, MD  omeprazole (PRILOSEC) 20 MG capsule Take 1 capsule (20 mg total) by mouth 2 (two) times daily before a meal. 07/04/19   Tylene Fantasia, PA-C  ondansetron (ZOFRAN-ODT) 4 MG disintegrating tablet Take 4 mg by mouth every 8 (eight) hours as needed for nausea. Patient not taking: Reported on 07/09/2019 06/23/19   [provider]  oxyCODONE (OXY IR/ROXICODONE) 5 MG immediate release tablet Take 1 tablet (5 mg total) by mouth every 6 (six) hours as needed for  severe pain or breakthrough pain. Patient not taking: Reported on 07/09/2019 07/04/19   Tylene Fantasia, PA-C  triamcinolone cream (KENALOG) 0.1 % Apply 1 application topically 2 (two) times daily. Patient not taking: Reported on 06/30/2019    [provider]    Allergies: Allergies  Allergen Reactions  . Soy Protein  [Soybean Oil] Rash  . Wheat Bran  Rash  . Amoxicillin-Pot Clavulanate Diarrhea    Other reaction(s): Unknown  . Biaxin [Clarithromycin] Diarrhea    Other reaction(s): Unknown  . Oxycodone Itching  . Codeine Rash  . Nitrofurantoin Rash  . Soy Allergy Rash    Review of Systems: Review of Systems  Constitutional: Negative for chills and fever.  Respiratory: Negative for cough and shortness of breath.   Cardiovascular: Negative for chest pain and palpitations.  Gastrointestinal: Positive for abdominal pain. Negative for constipation, diarrhea, nausea and vomiting.  Genitourinary: Negative for dysuria and urgency.  All other systems reviewed and are negative.   Physical Exam Ht 5\' 2"  (1.575 m)   BMI 24.44 kg/m   Physical Exam Vitals and nursing note reviewed. Exam conducted with a chaperone present.  Constitutional:      General: She is not in acute distress.    Appearance: Normal appearance. She is normal weight. She is not ill-appearing.  HENT:     Head: Normocephalic and atraumatic.  Eyes:     General: No scleral icterus.    Conjunctiva/sclera: Conjunctivae normal.  Cardiovascular:     Rate and Rhythm: Normal rate and regular rhythm.  Pulmonary:     Effort: Pulmonary effort is normal. No respiratory distress.     Breath sounds: Normal breath sounds.  Abdominal:     General: Abdomen is flat.     Palpations: Abdomen is soft.     Tenderness: There is no abdominal tenderness. There is no guarding or rebound.     Comments: Percutaneous drain in the LLQ, site is CDI, she continues to have purulent drainage from this.   Genitourinary:    Comments: Deferred Musculoskeletal:     Right lower leg: No edema.     Left lower leg: No edema.  Skin:    General: Skin is warm and dry.  Neurological:     General: No focal deficit present.     Mental Status: She is alert and oriented to person, place, and time.  Psychiatric:        Attention and Perception: Attention normal.        Mood and Affect: Mood is  depressed.     Labs/Imaging:  CT Abdomen/Pelvis (07/09/2019) personally reviewed which showed interval improvement in diverticulitis and resolution of abscess, and radiologist report reviewed:  IMPRESSION: 1. Interval placement of a percutaneous pigtail drainage catheter in the previously seen sigmoid diverticular abscess. No residual fluid noted. 2. Sigmoid diverticulosis with adjacent stranding, likely residual inflammation. No bowel obstruction. Normal appendix. 3. Fatty liver. 4. Aortic Atherosclerosis (ICD10-I70.0).     Assessment and Plan: This is a 69 y.o. female with acute sigmoid diverticulitis with abscess s/p percutaneous drainage.   - Unfortunately she continues to have purulent output from this drain, I have switch her to a JP bulb in effort to hopefully better drain whatever residual purulence she may have. In 1 week if this is not resolved I will have her return to IR for drain study and assess for residual abscess cavity or fistula  - I She does not appear toxic/septic and is without signs/symptoms of infection. I do  not think ABx are warranted at the moment and drain is adequately controlling any residual abscess  - I again discussed with our recommendations for elective sigmoid colectomy in the future if we can get her through this insult. She seems interested in this however we will revisit this in the future  - rtc in 1 week for re-check    Face-to-face time spent with the patient and care providers was 25 minutes, with more than 50% of the time spent counseling, educating, and coordinating care of the patient.     Edison Simon, PA-C [facility] 07/15/2019, 11:12 AM [phone] M-F: 7am - 4pm

## 2019-07-15 NOTE — Patient Instructions (Addendum)
Continue to flush your drain daily with only 5-6 cc(1/2 syringe full) of saline.  Be sure to increase your fluids. Increase your protein intake.   Check for fevers greater than 100 f, or 38 Celsius.   Follow up here next week.    Laparoscopic Colectomy Laparoscopic colectomy is surgery to remove part or all of the large intestine (colon). This procedure may be used to treat several conditions, including:  Inflammation and infection of the colon (diverticulitis).  Tumors or masses in the colon.  Inflammatory bowel disease, such as Crohn disease or ulcerative colitis. Colectomy is an option when symptoms cannot be controlled with medicines.  Bleeding from the colon that cannot be controlled by another method.  Blockage or obstruction of the colon.  Tell a health care provider about:  Any allergies you have.  All medicines you are taking, including vitamins, herbs, eye drops, creams, and over-the-counter medicines.  Any problems you or family members have had with anesthetic medicines.  Any blood disorders you have.  Any surgeries you have had.  Any medical conditions you have. What are the risks? Generally, this is a safe procedure. However, problems may occur, including:  Infection.  Bleeding.  Allergic reactions to medicines or dyes.  Damage to other structures or organs.  Leaking from where the colon was sewn together.  Future blockage of the small intestines from scar tissue. Another surgery may be needed to repair this.  Needing to convert to an open procedure. Complications such as damage to other organs or excessive bleeding may require the surgeon to convert from a laparoscopic procedure to an open procedure. This involves making a larger incision in the abdomen.  What happens before the procedure? Staying hydrated Follow instructions from your health care provider about hydration, which may include:  Up to 2 hours before the procedure - you may continue  to drink clear liquids, such as water, clear fruit juice, black coffee, and plain tea.  Eating and drinking restrictions Follow instructions from your health care provider about eating and drinking, which may include:  8 hours before the procedure - stop eating heavy meals, meals with high fiber, or foods such as meat, fried foods, or fatty foods.  6 hours before the procedure - stop eating light meals or foods, such as toast or cereal.  6 hours before the procedure - stop drinking milk or drinks that contain milk.  2 hours before the procedure - stop drinking clear liquids.  Medicines  Ask your health care provider about: ? Changing or stopping your regular medicines. This is especially important if you are taking diabetes medicines or blood thinners. ? Taking medicines such as aspirin and ibuprofen. These medicines can thin your blood. Do not take these medicines before your procedure if your health care provider instructs you not to.  You may be given antibiotic medicine to clean out bacteria from your colon. Follow the directions carefully and take the medicine at the correct time. General instructions  You may be prescribed an oral bowel prep to clean out your colon in preparation for the surgery: ? Follow instructions from your health care provider about how to do this. ? Do not eat or drink anything else after you have started the bowel prep, unless your health care provider tells you it is safe to do so.  Do not use any products that contain nicotine or tobacco, such as cigarettes and e-cigarettes. If you need help quitting, ask your health care provider. What happens during  the procedure?  To reduce your risk of infection: ? Your health care team will wash or sanitize their hands. ? Your skin will be washed with soap.  An IV tube will be inserted into one of your veins to deliver fluid and medication.  You will be given one of the following: ? A medicine to help you relax  (sedative). ? A medicine to make you fall asleep (general anesthetic).  Small monitors will be connected to your body. They will be used to check your heart, blood pressure, and oxygen level.  A breathing tube may be placed into your lungs during the procedure.  A thin, flexible tube (catheter) will be placed into your bladder to drain urine.  A tube may be placed through your nose and into your stomach to drain stomach fluids (nasogastric tube, or NG tube).  Your abdomen will be filled with air so it expands. This gives the surgeon more room to operate and makes your organs easier to see.  Several small cuts (incisions) will be made in your abdomen.  A thin, lighted tube with a tiny camera on the end (laparoscope) will be put through one of the small incisions. The camera on the laparoscope will send a picture to a computer screen in the operating room. This will give the surgeon a good view inside your abdomen.  Hollow tubes will be put through the other small incisions in your abdomen. The tools that are needed for the procedure will be put through these tubes.  Clamps or staples will be put on both ends of the diseased part of the colon.  The part of the intestine between the clamps or staples will be removed.  If possible, the ends of the healthy colon that remain will be stitched (sutured) or stapled together to allow your body to pass waste (stool).  Sometimes, the remaining colon cannot be stitched back together. If this is the case, a colostomy will be needed. If you need a colostomy: ? An opening to the outside of your body (stoma) will be made through your abdomen. ? The end of your colon will be brought to the opening. It will be stitched to the skin. ? A bag will be attached to the opening. Stool will drain into this removable bag. ? The colostomy may be temporary or permanent.  The incisions from the colectomy will be closed with sutures or staples. The procedure may  vary among health care providers and hospitals. What happens after the procedure?  Your blood pressure, heart rate, breathing rate, and blood oxygen level will be monitored until the medicines you were given have worn off.  You will receive fluids through an IV tube until your bowels start to work properly.  Once your bowels are working again, you will be given clear liquids first and then solid food as tolerated.  You will be given medicines to control your pain and nausea, if needed.  Do not drive for 24 hours if you were given a sedative. This information is not intended to replace advice given to you by your health care provider. Make sure you discuss any questions you have with your health care provider. Document Released: 03/25/2002 Document Revised: 10/04/2015 Document Reviewed: 10/04/2015 Elsevier Interactive Patient Education  2018 [employer].     Laparoscopic Colectomy, Care After This sheet gives you information about how to care for yourself after your procedure. Your health care provider may also give you more specific instructions. If you have problems  or questions, contact your health care provider. What can I expect after the procedure? After your procedure, it is common to have the following:  Pain in your abdomen, especially in the incision areas. You will be given medicine to control the pain.  Tiredness. This is a normal part of the recovery process. Your energy level will return to normal over the next several weeks.  Changes in your bowel movements, such as constipation or needing to go more often. Talk with your health care provider about how to manage this.  Follow these instructions at home: Medicines  Take over-the-counter and prescription medicines only as told by your health care provider.  Do not drive or use heavy machinery while taking prescription pain medicine.  Do not drink alcohol while taking prescription pain medicine.  If you were  prescribed an antibiotic medicine, use it as told by your health care provider. Do not stop using the antibiotic even if you start to feel better. Incision care  Follow instructions from your health care provider about how to take care of your incision areas. Make sure you: ? Keep your incisions clean and dry. ? Wash your hands with soap and water before and after applying medicine to the areas, and before and after changing your bandage (dressing). If soap and water are not available, use hand sanitizer. ? Change your dressing as told by your health care provider. ? Leave stitches (sutures), skin glue, or adhesive strips in place. These skin closures may need to stay in place for 2 weeks or longer. If adhesive strip edges start to loosen and curl up, you may trim the loose edges. Do not remove adhesive strips completely unless your health care provider tells you to do that.  Do not wear tight clothing over the incisions. Tight clothing may rub and irritate the incision areas, which may cause the incisions to open.  Do not take baths, swim, or use a hot tub until your health care provider approves. Ask your health care provider if you can take showers. You may only be allowed to take sponge baths for bathing.  Check your incision area every day for signs of infection. Check for: ? More redness, swelling, or pain. ? More fluid or blood. ? Warmth. ? Pus or a bad smell. Activity  Avoid lifting anything that is heavier than 10 lb (4.5 kg) for 2 weeks or until your health care provider says it is okay.  You may resume normal activities as told by your health care provider. Ask your health care provider what activities are safe for you.  Take rest breaks during the day as needed. Eating and drinking  Follow instructions from your health care provider about what you can eat after surgery.  To prevent or treat constipation while you are taking prescription pain medicine, your health care  provider may recommend that you: ? Drink enough fluid to keep your urine clear or pale yellow. ? Take over-the-counter or prescription medicines. ? Eat foods that are high in fiber, such as fresh fruits and vegetables, whole grains, and beans. ? Limit foods that are high in fat and processed sugars, such as fried and sweet foods. General instructions  Ask your health care provider when you will need an appointment to get your sutures or staples removed.  Keep all follow-up visits as told by your health care provider. This is important. Contact a health care provider if:  You have more redness, swelling, or pain around your incisions.  You  have more fluid or blood coming from the incisions.  Your incisions feel warm to the touch.  You have pus or a bad smell coming from your incisions or your dressing.  You have a fever.  You have an incision that breaks open (edges not staying together) after sutures or staples have been removed. Get help right away if:  You develop a rash.  You have chest pain or difficulty breathing.  You have pain or swelling in your legs.  You feel light-headed or you faint.  Your abdomen swells (becomes distended).  You have nausea or vomiting.  You have blood in your stool (feces). This information is not intended to replace advice given to you by your health care provider. Make sure you discuss any questions you have with your health care provider. Document Released: 07/22/2004 Document Revised: 10/04/2015 Document Reviewed: 10/04/2015 Elsevier Interactive Patient Education  [employer].

## 2019-07-17 ENCOUNTER — Inpatient Hospital Stay
Admission: EM | Admit: 2019-07-17 | Discharge: 2019-07-23 | DRG: 392 | Disposition: A | Discharge status: Home Health | Payer: Medicare Other | Attending: Surgery | Admitting: Surgery

## 2019-07-17 ENCOUNTER — Other Ambulatory Visit: Payer: Self-pay

## 2019-07-17 ENCOUNTER — Emergency Department: Payer: Medicare Other

## 2019-07-17 DIAGNOSIS — Z841 Family history of disorders of kidney and ureter: Secondary | ICD-10-CM | POA: Diagnosis not present

## 2019-07-17 DIAGNOSIS — R109 Unspecified abdominal pain: Secondary | ICD-10-CM

## 2019-07-17 DIAGNOSIS — E876 Hypokalemia: Secondary | ICD-10-CM | POA: Diagnosis not present

## 2019-07-17 DIAGNOSIS — Z20822 Contact with and (suspected) exposure to covid-19: Secondary | ICD-10-CM | POA: Diagnosis present

## 2019-07-17 DIAGNOSIS — K5792 Diverticulitis of intestine, part unspecified, without perforation or abscess without bleeding: Secondary | ICD-10-CM

## 2019-07-17 DIAGNOSIS — F17200 Nicotine dependence, unspecified, uncomplicated: Secondary | ICD-10-CM | POA: Diagnosis not present

## 2019-07-17 DIAGNOSIS — L309 Dermatitis, unspecified: Secondary | ICD-10-CM | POA: Diagnosis present

## 2019-07-17 DIAGNOSIS — K5720 Diverticulitis of large intestine with perforation and abscess without bleeding: Secondary | ICD-10-CM | POA: Diagnosis present

## 2019-07-17 DIAGNOSIS — F17210 Nicotine dependence, cigarettes, uncomplicated: Secondary | ICD-10-CM | POA: Diagnosis present

## 2019-07-17 DIAGNOSIS — Z853 Personal history of malignant neoplasm of breast: Secondary | ICD-10-CM | POA: Diagnosis not present

## 2019-07-17 LAB — URINALYSIS, COMPLETE (UACMP) WITH MICROSCOPIC
Bilirubin Urine: NEGATIVE
Glucose, UA: NEGATIVE mg/dL
Ketones, ur: NEGATIVE mg/dL
Nitrite: NEGATIVE
Protein, ur: 100 mg/dL — AB
Specific Gravity, Urine: 1.041 — ABNORMAL HIGH (ref 1.005–1.030)
WBC, UA: >50 WBC/hpf — ABNORMAL HIGH (ref 0–5)
pH: 6 (ref 5.0–8.0)

## 2019-07-17 LAB — CBC
HCT: 48.6 % — ABNORMAL HIGH (ref 36.0–46.0)
Hemoglobin: 15.9 g/dL — ABNORMAL HIGH (ref 12.0–15.0)
MCH: 30.3 pg (ref 26.0–34.0)
MCHC: 32.7 g/dL (ref 30.0–36.0)
MCV: 92.6 fL (ref 80.0–100.0)
Platelets: 323 10*3/uL (ref 150–400)
RBC: 5.25 MIL/uL — ABNORMAL HIGH (ref 3.87–5.11)
RDW: 13.2 % (ref 11.5–15.5)
WBC: 11.8 10*3/uL — ABNORMAL HIGH (ref 4.0–10.5)
nRBC: 0 % (ref 0.0–0.2)

## 2019-07-17 LAB — BASIC METABOLIC PANEL
Anion gap: 10 (ref 5–15)
BUN: 9 mg/dL (ref 8–23)
CO2: 31 mmol/L (ref 22–32)
Calcium: 9.3 mg/dL (ref 8.9–10.3)
Chloride: 98 mmol/L (ref 98–111)
Creatinine, Ser: 0.9 mg/dL (ref 0.44–1.00)
GFR calc Af Amer: >60 mL/min (ref >60)
GFR calc non Af Amer: >60 mL/min (ref >60)
Glucose, Bld: 111 mg/dL — ABNORMAL HIGH (ref 70–99)
Potassium: 3.9 mmol/L (ref 3.5–5.1)
Sodium: 139 mmol/L (ref 135–145)

## 2019-07-17 LAB — HIV ANTIBODY (ROUTINE TESTING W REFLEX): HIV Screen 4th Generation wRfx: NONREACTIVE

## 2019-07-17 LAB — SARS CORONAVIRUS 2 BY RT PCR (HOSPITAL ORDER, PERFORMED IN ~~LOC~~ HOSPITAL LAB): SARS Coronavirus 2: NEGATIVE

## 2019-07-17 MED ORDER — LACTATED RINGERS IV SOLN: INTRAVENOUS | Status: DC

## 2019-07-17 MED ORDER — KETOROLAC TROMETHAMINE 30 MG/ML IJ SOLN
15.0000 mg | Freq: Four times a day (QID) | INTRAMUSCULAR | Status: DC
  Administered 2019-07-17 – 2019-07-18 (×5): 15 mg via INTRAVENOUS
  Filled 2019-07-17 (×5): qty 1

## 2019-07-17 MED ORDER — PIPERACILLIN-TAZOBACTAM 3.375 G IVPB 30 MIN
3.3750 g | Freq: Once | INTRAVENOUS | Status: AC
  Administered 2019-07-17: 3.375 g via INTRAVENOUS
  Filled 2019-07-17: qty 50

## 2019-07-17 MED ORDER — POLYETHYLENE GLYCOL 3350 17 G PO PACK: 17.0000 g | PACK | Freq: Every day | ORAL | Status: DC | PRN

## 2019-07-17 MED ORDER — HYDROMORPHONE HCL 1 MG/ML IJ SOLN: 0.5000 mg | INTRAMUSCULAR | Status: DC | PRN

## 2019-07-17 MED ORDER — METRONIDAZOLE IN NACL 5-0.79 MG/ML-% IV SOLN
500.0000 mg | Freq: Three times a day (TID) | INTRAVENOUS | Status: DC
  Administered 2019-07-17 – 2019-07-18 (×4): 500 mg via INTRAVENOUS
  Filled 2019-07-17 (×6): qty 100

## 2019-07-17 MED ORDER — IOHEXOL 300 MG/ML  SOLN
100.0000 mL | Freq: Once | INTRAMUSCULAR | Status: AC | PRN
  Administered 2019-07-17: 100 mL via INTRAVENOUS

## 2019-07-17 MED ORDER — ONDANSETRON HCL 4 MG/2ML IJ SOLN
4.0000 mg | Freq: Four times a day (QID) | INTRAMUSCULAR | Status: DC | PRN
  Administered 2019-07-18 – 2019-07-19 (×3): 4 mg via INTRAVENOUS
  Filled 2019-07-17 (×3): qty 2

## 2019-07-17 MED ORDER — PANTOPRAZOLE SODIUM 40 MG IV SOLR
40.0000 mg | Freq: Every day | INTRAVENOUS | Status: DC
  Administered 2019-07-17 – 2019-07-22 (×6): 40 mg via INTRAVENOUS
  Filled 2019-07-17 (×6): qty 40

## 2019-07-17 MED ORDER — FENTANYL CITRATE (PF) 100 MCG/2ML IJ SOLN
50.0000 ug | Freq: Once | INTRAMUSCULAR | Status: AC
  Administered 2019-07-17: 50 ug via INTRAVENOUS
  Filled 2019-07-17: qty 2

## 2019-07-17 MED ORDER — ONDANSETRON 4 MG PO TBDP: 4.0000 mg | ORAL_TABLET | Freq: Four times a day (QID) | ORAL | Status: DC | PRN

## 2019-07-17 MED ORDER — ACETAMINOPHEN 500 MG PO TABS
1000.0000 mg | ORAL_TABLET | Freq: Four times a day (QID) | ORAL | Status: DC
  Administered 2019-07-17 – 2019-07-23 (×14): 1000 mg via ORAL
  Filled 2019-07-17 (×22): qty 2

## 2019-07-17 MED ORDER — CIPROFLOXACIN IN D5W 400 MG/200ML IV SOLN
400.0000 mg | Freq: Two times a day (BID) | INTRAVENOUS | Status: DC
  Administered 2019-07-17 – 2019-07-18 (×3): 400 mg via INTRAVENOUS
  Filled 2019-07-17 (×6): qty 200

## 2019-07-17 MED ORDER — ONDANSETRON HCL 4 MG/2ML IJ SOLN
4.0000 mg | Freq: Once | INTRAMUSCULAR | Status: AC
  Administered 2019-07-17: 4 mg via INTRAVENOUS
  Filled 2019-07-17: qty 2

## 2019-07-17 MED ORDER — ENOXAPARIN SODIUM 40 MG/0.4ML ~~LOC~~ SOLN
40.0000 mg | SUBCUTANEOUS | Status: DC
  Administered 2019-07-17 – 2019-07-23 (×7): 40 mg via SUBCUTANEOUS
  Filled 2019-07-17 (×7): qty 0.4

## 2019-07-17 NOTE — ED Notes (Signed)
Messaged surgical PA to see if pt can have clears

## 2019-07-17 NOTE — ED Provider Notes (Signed)
[facility] Emergency Department Provider Note  ____________________________________________   First MD Initiated Contact with Patient 07/17/19 [phone]     (approximate)  I have reviewed the triage vital signs and the nursing notes.  History  Chief Complaint Abdominal Pain    HPI Brianna Briggs is a 69 y.o. female past medical history as below, including recent diverticulitis with abscess, currently with drain in place (IR guided drain 6/15), solitary left kidney, who presents to the emergency department for left flank and abdominal pain.  Patient states over the last few days she has had constant, worsening left-sided abdominal pain.  Located in the LLQ, and also radiates/spans to include the left flank and left groin area.  She describes this as sharp, stabbing, constant.  9/10 in severity.  No alleviating/aggravating components.  Associated with nausea, no vomiting.  No changes to her bowel habits.  Perhaps some very mild dysuria at the end of urination.  No history of kidney stones.  Husband at bedside, who helps maintain her drain, feels like the drain has been leaking more with flushing over the last week or so.   Past Medical Hx Past Medical History:  Diagnosis Date  . Allergic genetic state   . Breast cancer, right ([place]) 1996   Mastectomy  . History of chicken pox   . History of colon polyps   . Hyperlipidemia   . Single kidney   . 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 Diverticulitis of intestine with abscess without bleeding 06/30/2019  . Chronic eczema 08/30/2015  . Health care maintenance 03/01/2015  . Simple chronic bronchitis ([place]) 03/01/2015  . Shortness of breath 12/16/2014  . Hyperlipidemia, mixed 06/04/2014  . Hypercholesterolemia 02/26/2014  . PVC (premature ventricular contraction) 09/04/2013  . Tobacco abuse  09/04/2013    Past Surgical Hx Past Surgical History:  Procedure Laterality Date  . ABDOMINAL HYSTERECTOMY    . COLONOSCOPY WITH PROPOFOL N/A 05/01/2019   Procedure: COLONOSCOPY WITH PROPOFOL;  Surgeon: Toledo, Benay Pike, MD;  Location: [facility] ENDOSCOPY;  Service: Gastroenterology;  Laterality: N/A;  . ESOPHAGOGASTRODUODENOSCOPY (EGD) WITH PROPOFOL N/A 05/01/2019   Procedure: ESOPHAGOGASTRODUODENOSCOPY (EGD) WITH PROPOFOL;  Surgeon: Toledo, Benay Pike, MD;  Location: [facility] ENDOSCOPY;  Service: Gastroenterology;  Laterality: N/A;  . MASTECTOMY    . SHOULDER ARTHROSCOPY WITH SUBACROMIAL DECOMPRESSION, ROTATOR CUFF REPAIR AND BICEP TENDON REPAIR Left 01/13/2008  . TONSILLECTOMY    . TUBAL LIGATION      Medications Prior to Admission medications   Medication Sig Start Date End Date Taking? Authorizing Provider  fluticasone (FLONASE) 50 MCG/ACT nasal spray Place 2 sprays into both nostrils daily as needed.  05/02/19   [provider]  ibuprofen (ADVIL) 600 MG tablet Take 1 tablet (600 mg total) by mouth every 6 (six) hours as needed for fever, headache, mild pain or moderate pain. 07/04/19   Tylene Fantasia, PA-C  omeprazole (PRILOSEC) 20 MG capsule Take 1 capsule (20 mg total) by mouth 2 (two) times daily before a meal. Patient taking differently: Take 20 mg by mouth 2 (two) times daily as needed.  07/04/19   Tylene Fantasia, PA-C    Allergies Soy protein  [place] oil], Wheat bran, Amoxicillin-pot clavulanate, Biaxin [clarithromycin], Oxycodone, Codeine, Nitrofurantoin, and Soy allergy  Family Hx Family History  Problem Relation Age of Onset  . Cancer Father   .  Kidney failure Mother     Social Hx Social History   Tobacco Use  . Smoking status: Current Every Day Smoker    Packs/day: 1.50    Years: 40.00    Pack years: 60.00    Types: Cigarettes  . Smokeless tobacco: Never Used  Vaping Use  . Vaping Use: Never assessed  Substance Use Topics  . Alcohol use: No  . Drug  use: No     Review of Systems  Constitutional: Negative for fever. Negative for chills. Eyes: Negative for visual changes. ENT: Negative for sore throat. Cardiovascular: Negative for chest pain. Respiratory: Negative for shortness of breath. Gastrointestinal: + abdominal pain Genitourinary: Negative for dysuria. Musculoskeletal: Negative for leg swelling. Skin: Negative for rash. Neurological: Negative for headaches.   Physical Exam  Vital Signs: ED Triage Vitals  Enc Vitals Group     BP 07/17/19 0301 106/87     Pulse Rate 07/17/19 0301 (!) 101     Resp 07/17/19 0301 18     Temp 07/17/19 0301 99.1 F (37.3 C)     Temp Source 07/17/19 0301 Oral     SpO2 07/17/19 0301 100 %     Weight 07/17/19 0300 134 lb (60.8 kg)     Height 07/17/19 0300 5\' 2"  (1.575 m)     Head Circumference --      Peak Flow --      Pain Score 07/17/19 0300 9     Pain Loc --      Pain Edu? --      Excl. in [place]? --     Constitutional: Alert and oriented.  Head: Normocephalic. Atraumatic. Eyes: Conjunctivae clear. Sclera anicteric. Pupils equal and symmetric. Nose: No masses or lesions. No congestion or rhinorrhea. Mouth/Throat: Wearing mask.  Neck: No stridor. Trachea midline.  Cardiovascular: Normal rate, regular rhythm. Extremities well perfused. Respiratory: Normal respiratory effort.  Lungs CTAB. Gastrointestinal: Soft. Non-distended. TTP in LLQ and L mid abdomen. Drain in place with mixed serous purulent fluid in bulb. Genitourinary: Deferred. Musculoskeletal: No lower extremity edema. No deformities. Neurologic:  Normal speech and language. No gross focal or lateralizing neurologic deficits are appreciated.  Skin: Skin is warm, dry and intact. No rash noted. Psychiatric: Mood and affect are appropriate for situation.    Radiology  Personally reviewed available imaging myself.   CT A/P - IMPRESSION:  1. New site of early diverticulitis along the descending colon,  uncomplicated.    2. No residual abscess at the site of percutaneous drainage  catheter. Inflammation in this area is stable to decreased.    Procedures  Procedure(s) performed (including critical care):  Procedures   Initial Impression / Assessment and Plan / MDM / ED Course  69 y.o. female who presents to the ED for left sided abdominal pain. Recent hx notable for diverticulitis w/ abscess, w/ drain in place  Ddx: complication from diverticulitis, recurrent diverticulitis, drain complication or migration, pyelonephritis, kidney stone  Will plan for labs, imaging, symptom control and reassess  Work-up reveals a new site of diverticulitis along her descending colon, uncomplicated.  There is no residual abscess at the site of her percutaneous drainage, inflammation in that area is stable/decreased.  Feel she would benefit from admission for antibiotics, pain/symptom control, especially in the setting of her recent, more complicated diverticulitis in mid June. Given her recent admission to surgery, discussed case with Dr. Hampton Abbot.  He will admit.  Updated patient and husband at bedside on results and plan. She voices understanding  of the plan and is in agreement..   _______________________________   As part of my medical decision making I have reviewed available labs, radiology tests, reviewed old records/performed chart review, obtained additional history from husband, and discussed with consultants (surgery, Dr. Hampton Abbot).     Final Clinical Impression(s) / ED Diagnosis  Final diagnoses:  Left sided abdominal pain  Diverticulitis       Note:  This document was prepared using Dragon voice recognition software and may include unintentional dictation errors.   Lilia Pro., MD 07/17/19 [phone]

## 2019-07-17 NOTE — H&P (Signed)
[facility] SURGICAL HISTORY & PHYSICAL (cpt [phone])  HISTORY OF PRESENT ILLNESS (HPI):  69 y.o. female who is well known to our service following recent admission for complicated diverticulitis s/p drain placement on 06/15 who has been following up outpatient for this.Unfortunately, her drainage has remained purulent and the drain has remained in place since last admission. She presented to [facility] ED overnight for worsening abdominal pain. Patient reports that in the last few days she has had worsening left sided abdominal and flank pain. The pain is in her LLQ and radiates through to her flanks. This was described as a sharp and stabbing pain. This is severe, constant, and rated 9/10. No fever, chills, CP, SOB, emesis, urinary changes, or bowel changes. She does endorse some nausea but this has been a chronic complaint for some time. This presentation was similar to her prior for diverticulitis. She and her husband have been maintaining her drain at home but she feels this has started to leak over the last week or so. Again, only previous abdominal surgery is abdominal hysterectomy. Last colonoscopy was in April of this year with [facility] gastroenterology. Work up in the ED was concerning for mild leukocytosis with WBC 11.8K, Hgb was 15.9 and likely hemoconcentration, and CT Abdomen/Pelvis was concerning for a new area of uncomplicated diverticulitis in the descending colon.   General surgery is consulted by emergency medicine physician Dr Tyson Alias, MD for evaluation and management of diverticulitis.    PAST MEDICAL HISTORY (PMH):  Past Medical History:  Diagnosis Date  . Allergic genetic state   . Breast cancer, right ([place]) 1996   Mastectomy  . History of chicken pox   . History of colon polyps   . Hyperlipidemia   . Single kidney   . 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 Otherwise negative.   PAST SURGICAL HISTORY ([place]):   Past Surgical History:  Procedure Laterality Date  . ABDOMINAL HYSTERECTOMY    . COLONOSCOPY WITH PROPOFOL N/A 05/01/2019   Procedure: COLONOSCOPY WITH PROPOFOL;  Surgeon: Toledo, Benay Pike, MD;  Location: [facility] ENDOSCOPY;  Service: Gastroenterology;  Laterality: N/A;  . ESOPHAGOGASTRODUODENOSCOPY (EGD) WITH PROPOFOL N/A 05/01/2019   Procedure: ESOPHAGOGASTRODUODENOSCOPY (EGD) WITH PROPOFOL;  Surgeon: Toledo, Benay Pike, MD;  Location: [facility] ENDOSCOPY;  Service: Gastroenterology;  Laterality: N/A;  . MASTECTOMY    . SHOULDER ARTHROSCOPY WITH SUBACROMIAL DECOMPRESSION, ROTATOR CUFF REPAIR AND BICEP TENDON REPAIR Left 01/13/2008  . TONSILLECTOMY    . TUBAL LIGATION      Reviewed. Otherwise negative.   MEDICATIONS:  Prior to Admission medications   Medication Sig Start Date End Date Taking? Authorizing Provider  fluticasone (FLONASE) 50 MCG/ACT nasal spray Place 2 sprays into both nostrils daily as needed.  05/02/19  Yes [provider]  omeprazole (PRILOSEC) 20 MG capsule Take 1 capsule (20 mg total) by mouth 2 (two) times daily before a meal. Patient taking differently: Take 20 mg by mouth 2 (two) times daily as needed.  07/04/19  Yes Tylene Fantasia, PA-C     ALLERGIES:  Allergies  Allergen Reactions  . Soy Protein  [Soybean Oil] Rash  . Wheat Bran Rash  . Amoxicillin-Pot Clavulanate Diarrhea    Other reaction(s): Unknown  . Biaxin [Clarithromycin] Diarrhea    Other reaction(s): Unknown  . Oxycodone Itching  . Codeine Rash  . Nitrofurantoin Rash  . Soy Allergy Rash     SOCIAL HISTORY:  Social History  Socioeconomic History  . Marital status: Married    Spouse name: Not on file  . Number of children: Not on file  . Years of education: Not on file  . Highest education level: Not on file  Occupational History  . Not on file  Tobacco Use  . Smoking status: Current Every Day Smoker    Packs/day: 1.50    Years: 40.00    Pack years: 60.00    Types: Cigarettes  .  Smokeless tobacco: Never Used  Vaping Use  . Vaping Use: Never assessed  Substance and Sexual Activity  . Alcohol use: No  . Drug use: No  . Sexual activity: Not on file  Other Topics Concern  . Not on file  Social History Narrative   Lives at home with husband   Social Determinants of Health   Financial Resource Strain:   . Difficulty of Paying Living Expenses:   Food Insecurity:   . Worried About Charity fundraiser in the Last Year:   . Arboriculturist in the Last Year:   Transportation Needs:   . Film/video editor (Medical):   Marland Kitchen Lack of Transportation (Non-Medical):   Physical Activity:   . Days of Exercise per Week:   . Minutes of Exercise per Session:   Stress:   . Feeling of Stress :   Social Connections:   . Frequency of Communication with Friends and Family:   . Frequency of Social Gatherings with Friends and Family:   . Attends Religious Services:   . Active Member of Clubs or Organizations:   . Attends Archivist Meetings:   Marland Kitchen Marital Status:   Intimate Partner Violence:   . Fear of Current or Ex-Partner:   . Emotionally Abused:   Marland Kitchen Physically Abused:   . Sexually Abused:      FAMILY HISTORY:  Family History  Problem Relation Age of Onset  . Cancer Father   . Kidney failure Mother     Otherwise negative.   REVIEW OF SYSTEMS:  Review of Systems  Constitutional: Negative for chills and fever.  HENT: Negative for congestion and sore throat.   Respiratory: Negative for cough and shortness of breath.   Cardiovascular: Negative for chest pain and palpitations.  Gastrointestinal: Positive for abdominal pain and nausea. Negative for vomiting.  Genitourinary: Negative for dysuria and urgency.  All other systems reviewed and are negative.   VITAL SIGNS:  Temp:  [99.1 F (37.3 C)] 99.1 F (37.3 C) (07/01 0301) Pulse Rate:  [93-110] 95 (07/01 0641) Resp:  [17-18] 17 (07/01 0641) BP: (96-119)/(51-87) 119/51 (07/01 0641) SpO2:  [89 %-100  %] 97 % (07/01 0641) Weight:  [60.8 kg] 60.8 kg (07/01 0300)     Height: 5\' 2"  (157.5 cm) Weight: 60.8 kg BMI (Calculated): 24.5   PHYSICAL EXAM:  Physical Exam Vitals and nursing note reviewed.  Constitutional:      General: She is not in acute distress.    Appearance: She is well-developed and normal weight. She is not ill-appearing.  HENT:     Head: Normocephalic and atraumatic.  Eyes:     General: No scleral icterus.    Extraocular Movements: Extraocular movements intact.  Cardiovascular:     Rate and Rhythm: Normal rate and regular rhythm.     Heart sounds: Normal heart sounds. No murmur heard.   Pulmonary:     Effort: Pulmonary effort is normal. No respiratory distress.     Breath sounds: Normal breath sounds.  Abdominal:     General: Abdomen is protuberant. There is no distension.     Palpations: Abdomen is soft.     Tenderness: There is abdominal tenderness in the suprapubic area and left lower quadrant. There is no guarding or rebound.     Comments: Percutaneous drain in the LLQ, serosanguinous fluid in the bulb but there is still murky/purulent drainage in the tubing.   Genitourinary:    Comments: Deferred Skin:    General: Skin is warm and dry.     Coloration: Skin is not jaundiced or pale.  Neurological:     General: No focal deficit present.     Mental Status: She is alert and oriented to person, place, and time.  Psychiatric:        Attention and Perception: Attention normal.        Mood and Affect: Mood is depressed.     INTAKE/OUTPUT:  This shift: No intake/output data recorded.  Last 2 shifts: @IOLAST2SHIFTS @  Labs:  CBC Latest Ref Rng & Units 07/17/2019 07/04/2019 07/03/2019  WBC 4.0 - 10.5 K/uL 11.8(H) 11.1(H) 12.5(H)  Hemoglobin 12.0 - 15.0 g/dL 15.9(H) 12.9 13.8  Hematocrit 36 - 46 % 48.6(H) 37.7 40.8  Platelets 150 - 400 K/uL 323 200 196   CMP Latest Ref Rng & Units 07/17/2019 07/03/2019 07/01/2019  Glucose 70 - 99 mg/dL 111(H) 77 95  BUN 8 - 23  mg/dL 9 <5(L) <5(L)  Creatinine 0.44 - 1.00 mg/dL 0.90 0.87 0.97  Sodium 135 - 145 mmol/L 139 138 140  Potassium 3.5 - 5.1 mmol/L 3.9 3.8 3.6  Chloride 98 - 111 mmol/L 98 103 104  CO2 22 - 32 mmol/L 31 28 28   Calcium 8.9 - 10.3 mg/dL 9.3 8.2(L) 8.1(L)  Total Protein 6.5 - 8.1 g/dL - - -  Total Bilirubin 0.3 - 1.2 mg/dL - - -  Alkaline Phos 38 - 126 U/L - - -  AST 15 - 41 U/L - - -  ALT 0 - 44 U/L - - -     Imaging studies:   CT Abdomen/Pelvis (07/17/2019) personally reviewed showing improvement in previously seen area of diverticulitis without residual abscess although she has new inflammation surrounding descending colon concerning for diverticulitis, and radiologist report reviewed:   IMPRESSION: 1. New site of early diverticulitis along the descending colon, uncomplicated. 2. No residual abscess at the site of percutaneous drainage catheter. Inflammation in this area is stable to decreased.   Assessment/Plan: (ICD-10's: K74.92) 69 y.o. female with unfortunately an episode of descending colon diverticulitis with improvement in previously seen complicated diverticulitis of the sigmoid colon with abscess s/p percutaneous drainage on 06/15   - Admit to general surgery  - NPO + IVF Resuscitation  - Continue IV ABx (Cipro + Flagyl); given her allergy history and intolerance of some ABx we will ask ID opinion on additional options for ABx management  - No emergent intervention; she understands if she fails conservative managements or clinically deteriorates then we would intervene sooner.    - Pain control prn; antiemetics prn  - Monitor abdominal examination; on-going bowel function   - monitor labs   - continue drain for now; monitor output   - medical management of comorbid conditions   - DVT prophylaxis  All of the above findings and recommendations were discussed with the patient, and all of her questions were answered to her expressed satisfaction.  -- Edison Simon,  PA-C [facility] 07/17/2019, 7:36 AM [phone] M-F: 7am - 4pm

## 2019-07-17 NOTE — ED Notes (Signed)
Pt sitting on bedside, tolerating well, vss.

## 2019-07-17 NOTE — ED Notes (Signed)
Pt ambulated to restroom. 

## 2019-07-17 NOTE — ED Notes (Addendum)
Patient sleeping comfortably. Awaiting bed status.

## 2019-07-17 NOTE — ED Triage Notes (Signed)
Pt in with co worsening left sided abd pain radiating to left back. Has recently dx with diverticulitis and diverticular abscess. Pt still has drain to left abd.

## 2019-07-17 NOTE — ED Notes (Signed)
Patient transported to CT at this time. 

## 2019-07-17 NOTE — ED Notes (Signed)
Given drink. NAD. Waiting on admit bed.

## 2019-07-17 NOTE — ED Notes (Signed)
Attempted to call report, RN to call back with questions, will return call

## 2019-07-18 ENCOUNTER — Other Ambulatory Visit: Payer: Medicare Other

## 2019-07-18 DIAGNOSIS — F17200 Nicotine dependence, unspecified, uncomplicated: Secondary | ICD-10-CM

## 2019-07-18 LAB — BASIC METABOLIC PANEL
Anion gap: 7 (ref 5–15)
BUN: 9 mg/dL (ref 8–23)
CO2: 30 mmol/L (ref 22–32)
Calcium: 8.6 mg/dL — ABNORMAL LOW (ref 8.9–10.3)
Chloride: 102 mmol/L (ref 98–111)
Creatinine, Ser: 0.8 mg/dL (ref 0.44–1.00)
GFR calc Af Amer: >60 mL/min (ref >60)
GFR calc non Af Amer: >60 mL/min (ref >60)
Glucose, Bld: 81 mg/dL (ref 70–99)
Potassium: 3.7 mmol/L (ref 3.5–5.1)
Sodium: 139 mmol/L (ref 135–145)

## 2019-07-18 LAB — CBC WITH DIFFERENTIAL/PLATELET
Abs Immature Granulocytes: 0.03 10*3/uL (ref 0.00–0.07)
Basophils Absolute: 0.1 10*3/uL (ref 0.0–0.1)
Basophils Relative: 1 %
Eosinophils Absolute: 0.2 10*3/uL (ref 0.0–0.5)
Eosinophils Relative: 4 %
HCT: 39.7 % (ref 36.0–46.0)
Hemoglobin: 12.8 g/dL (ref 12.0–15.0)
Immature Granulocytes: 1 %
Lymphocytes Relative: 16 %
Lymphs Abs: 1.1 10*3/uL (ref 0.7–4.0)
MCH: 30.2 pg (ref 26.0–34.0)
MCHC: 32.2 g/dL (ref 30.0–36.0)
MCV: 93.6 fL (ref 80.0–100.0)
Monocytes Absolute: 0.4 10*3/uL (ref 0.1–1.0)
Monocytes Relative: 7 %
Neutro Abs: 4.8 10*3/uL (ref 1.7–7.7)
Neutrophils Relative %: 71 %
Platelets: 217 10*3/uL (ref 150–400)
RBC: 4.24 MIL/uL (ref 3.87–5.11)
RDW: 13.3 % (ref 11.5–15.5)
WBC: 6.6 10*3/uL (ref 4.0–10.5)
nRBC: 0 % (ref 0.0–0.2)

## 2019-07-18 LAB — MAGNESIUM: Magnesium: 1.7 mg/dL (ref 1.7–2.4)

## 2019-07-18 MED ORDER — MAGNESIUM SULFATE 2 GM/50ML IV SOLN
2.0000 g | Freq: Once | INTRAVENOUS | Status: AC
  Administered 2019-07-18: 2 g via INTRAVENOUS
  Filled 2019-07-18: qty 50

## 2019-07-18 MED ORDER — BOOST / RESOURCE BREEZE PO LIQD CUSTOM
1.0000 | Freq: Three times a day (TID) | ORAL | Status: DC
  Administered 2019-07-18 – 2019-07-19 (×2): 1 via ORAL

## 2019-07-18 MED ORDER — DIPHENHYDRAMINE HCL 12.5 MG/5ML PO ELIX
12.5000 mg | ORAL_SOLUTION | Freq: Four times a day (QID) | ORAL | Status: DC | PRN
  Filled 2019-07-18 (×2): qty 5

## 2019-07-18 MED ORDER — SODIUM CHLORIDE 0.9 % IV SOLN
1.0000 g | Freq: Three times a day (TID) | INTRAVENOUS | Status: DC
  Administered 2019-07-18 – 2019-07-23 (×16): 1 g via INTRAVENOUS
  Filled 2019-07-18 (×19): qty 1

## 2019-07-18 MED ORDER — PROMETHAZINE HCL 25 MG/ML IJ SOLN
12.5000 mg | Freq: Four times a day (QID) | INTRAMUSCULAR | Status: DC | PRN
  Administered 2019-07-19: 12.5 mg via INTRAVENOUS
  Filled 2019-07-18: qty 1

## 2019-07-18 MED ORDER — POTASSIUM CHLORIDE CRYS ER 20 MEQ PO TBCR
40.0000 meq | EXTENDED_RELEASE_TABLET | Freq: Once | ORAL | Status: AC
  Administered 2019-07-18: 40 meq via ORAL
  Filled 2019-07-18: qty 2

## 2019-07-18 MED ORDER — DIPHENHYDRAMINE HCL 50 MG/ML IJ SOLN
25.0000 mg | Freq: Once | INTRAMUSCULAR | Status: AC
  Administered 2019-07-18: 25 mg via INTRAVENOUS
  Filled 2019-07-18: qty 1

## 2019-07-18 NOTE — Progress Notes (Signed)
[facility] SURGICAL PROGRESS NOTE (cpt [phone])  Hospital Day(s): 1.   Interval History: Patient seen and examined, no acute events or new complaints overnight. Patient reports she is feeling more nauseous this morning unsure of causea. Her abdominal pain is improved. Se denies fever, chills, emesis. No leukocytosis this morning, WBC 6.6K. BMP is unremarkable. Renal function is normal with sCr of 0.80. No significant electrolyte derangement ween. She was started on CLD yesterday afternoon. No drain output measured. No new complaints.   Review of Systems:  Constitutional: denies fever, chills  HEENT: denies cough or congestion  Respiratory: denies any shortness of breath  Cardiovascular: denies chest pain or palpitations  Gastrointestinal: + abdominal pain, + Nausea, denied Vomiting, or diarrhea/and bowel function as per interval history Genitourinary: denies burning with urination or urinary frequency Musculoskeletal: denies pain, decreased motor or sensation  Vital signs in last 24 hours: [min-max] current  Temp:  [97.4 F (36.3 C)-98.6 F (37 C)] 97.4 F (36.3 C) (07/02 0422) Pulse Rate:  [60-79] 66 (07/02 0422) Resp:  [16-20] 18 (07/02 0422) BP: (92-110)/(55-74) 100/62 (07/02 0422) SpO2:  [90 %-100 %] 98 % (07/02 0422)     Height: 5\' 2"  (157.5 cm) Weight: 60.8 kg BMI (Calculated): 24.5   Intake/Output last 2 shifts:  07/01 0701 - 07/02 0700 In: 450.3 [IV Piggyback:450.3] Out: 570 [Urine:570]   Physical Exam:  Constitutional: alert, cooperative and no distress  HENT: normocephalic without obvious abnormality  Eyes: PERRL, EOM's grossly intact and symmetric  Respiratory: breathing non-labored at rest  Cardiovascular: regular rate and sinus rhythm  Gastrointestinal: Soft, mild left sided soreness, and non-distended, no rebound/guarding. Percutaneous drain in the LL with seropurulent drainage Musculoskeletal: no edema or wounds, motor and sensation grossly intact,  NT    Labs:  CBC Latest Ref Rng & Units 07/18/2019 07/17/2019 07/04/2019  WBC 4.0 - 10.5 K/uL 6.6 11.8(H) 11.1(H)  Hemoglobin 12.0 - 15.0 g/dL 12.8 15.9(H) 12.9  Hematocrit 36 - 46 % 39.7 48.6(H) 37.7  Platelets 150 - 400 K/uL 217 323 200   CMP Latest Ref Rng & Units 07/18/2019 07/17/2019 07/03/2019  Glucose 70 - 99 mg/dL 81 111(H) 77  BUN 8 - 23 mg/dL 9 9 <5(L)  Creatinine 0.44 - 1.00 mg/dL 0.80 0.90 0.87  Sodium 135 - 145 mmol/L 139 139 138  Potassium 3.5 - 5.1 mmol/L 3.7 3.9 3.8  Chloride 98 - 111 mmol/L 102 98 103  CO2 22 - 32 mmol/L 30 31 28   Calcium 8.9 - 10.3 mg/dL 8.6(L) 9.3 8.2(L)  Total Protein 6.5 - 8.1 g/dL - - -  Total Bilirubin 0.3 - 1.2 mg/dL - - -  Alkaline Phos 38 - 126 U/L - - -  AST 15 - 41 U/L - - -  ALT 0 - 44 U/L - - -    Imaging studies: No new pertinent imaging studies   Assessment/Plan: (ICD-10's: K51.92) 69 y.o. female with unfortunately an episode of descending colon diverticulitis with improvement in previously seen complicated diverticulitis of the sigmoid colon with abscess s/p percutaneous drainage on 06/15   - Continue on CLD, can advance later today if feeling better.   - IVF Resuscitation             - Continue IV ABx (Cipro + Flagyl); given her allergy history and intolerance of some ABx we will ask ID opinion on additional options for ABx management             - No emergent intervention;  she understands if she fails conservative managements or clinically deteriorates then we would intervene sooner.               - Pain control prn; antiemetics prn             - Monitor abdominal examination; on-going bowel function              - monitor labs               - continue drain for now; monitor output              - medical management of comorbid conditions              - DVT prophylaxis  All of the above findings and recommendations were discussed with the patient, and the medical team, and all of patient's questions were answered to her expressed  satisfaction.  -- Edison Simon, PA-C [facility] 07/18/2019, 7:02 AM [phone] M-F: 7am - 4pm

## 2019-07-18 NOTE — Progress Notes (Signed)
Initial Nutrition Assessment  DOCUMENTATION CODES:   Not applicable  INTERVENTION:  Boost Breeze po TID, each supplement provides 250 kcal and 9 grams of protein  Advance diet as tolerated  Monitor magnesium, potassium, and phosphorus daily for at least 3 days, MD to replete as needed, as pt is at risk for refeeding syndrome given poor po intake per pt report  NUTRITION DIAGNOSIS:   Inadequate oral intake related to nausea, poor appetite as evidenced by per patient/family report.   GOAL:   Patient will meet greater than or equal to 90% of their needs   MONITOR:   Labs, I & O's, Diet advancement, Supplement acceptance, PO intake, Weight trends  REASON FOR ASSESSMENT:   Malnutrition Screening Tool    ASSESSMENT:  RD working remotely.  69 year old female admitted for new episode of diverticulitis with past medical history of right breast cancer s/p mastectomy, HLD, solitary left kidney, and recent diverticulitis with abscess currently with drain placed in IR on 6/15 presented with worsening left-sided abdominal pain and nausea.  Patient on conservative management,  remains on CLD, advance later today if feeling better per surgery.   Spoke with patient via phone, she reports feeling very nauseous, recalls having a little bit of broth this morning, reports very little po intake since Wednesday due to abdominal pain and nausea. Patient reports she was eating well home s/p 6/15 drain placement, recalls boiled chicken, flounder, hamburger patty, potatoes, black eyed peas, tried to drink Ensure supplement, stated it tasted metallic and was unable to tolerate. Patient is amenable to trying Boost Breeze supplement, spoke with PA via secure chat, will order TID.    Per chart, weights have remained stable over the past year.   Medications reviewed and include: Lactated ringers @ 75 ml/hr IVPB: Cipro, Flagyl Labs: K/Mg - WNL   NUTRITION - FOCUSED PHYSICAL EXAM: Unable to complete  at this time, RD working remotely.    Diet Order:   Diet Order            Diet clear liquid Room service appropriate? Yes; Fluid consistency: Thin  Diet effective now                 EDUCATION NEEDS:   No education needs have been identified at this time  Skin:  Skin Assessment: Skin Integrity Issues: Skin Integrity Issues:: Incisions Incisions: closed;L abdomen  Last BM:  7/30  Height:   Ht Readings from Last 1 Encounters:  07/17/19 5\' 2"  (1.575 m)    Weight:   Wt Readings from Last 1 Encounters:  07/17/19 60.8 kg    Ideal Body Weight:  50 kg  BMI:  Body mass index is 24.51 kg/m.  Estimated Nutritional Needs:   Kcal:  [id number]  Protein:  85-98  Fluid:  >/= 1.7 L/day   Lajuan Lines, RD, LDN Clinical Nutrition After Hours/Weekend Pager # [phone]

## 2019-07-18 NOTE — Progress Notes (Signed)
Patient c/o itching d/t Eczema. I offered to call provider for cream. Patient preferred to get husband to bring in cream she uses at home for relief. Patient has no shortness of breath or swelling.

## 2019-07-18 NOTE — Consult Note (Signed)
NAME: Brianna Briggs  DOB: Apr 26, 1950  MRN: [id number]  Date/Time: 07/18/2019 12:09 PM  REQUESTING PROVIDER: Dr. Hampton Abbot Subjective:  REASON FOR CONSULT: Diverticulitis ? Brianna Briggs is a 69 y.o. female with a history of severe eczema, right breast cancer status postmastectomy, recent diverticulitis Presented to the ED with left lower quadrant pain radiating to the back.  Patient has had left lower quadrant pain since June 21, 2019.  She was Admitted to [facility] 06/30/19-07/04/19 with sigmoid diverticulitis peridiverticular abscess and underwent percutaneous drainage by IR on 07/01/19 Culture was enterococcus avium. Discharged on augmenin. Started to have a rash and Augmentin stopped. She was seen in the surgical clinic on 6/29 and was doing ok on no antibiotics- there was  Concern that the drain was still putting out purulent fluid. The plan was to do another imaging soon. She came to the ED on 7/1 complaining of left sided abdominal pain going to the left back. She did not have any fever or chills, nausea, vomiting , diarrhea. She had CT abdomen which showed descending colon diverticulitis and started on cipro and flagyl. I am asked to see the patient because of intolerance to antibiotics As per patient she had swelling of both her eyelids this and had to receive Benadryl. She says the Augmentin made her breakout on her face and neck.  Past Medical History:  Diagnosis Date   Allergic genetic state    Breast cancer, right ([place]) 1996   Mastectomy   History of chicken pox    History of colon polyps    Hyperlipidemia    Single kidney    Squamous cell carcinoma of skin 01/27/2019   Right inferior lateral thigh. KA-type    Past Surgical History:  Procedure Laterality Date   ABDOMINAL HYSTERECTOMY     COLONOSCOPY WITH PROPOFOL N/A 05/01/2019   Procedure: COLONOSCOPY WITH PROPOFOL;  Surgeon: Toledo, Benay Pike, MD;  Location: [facility] ENDOSCOPY;  Service: Gastroenterology;  Laterality:  N/A;   ESOPHAGOGASTRODUODENOSCOPY (EGD) WITH PROPOFOL N/A 05/01/2019   Procedure: ESOPHAGOGASTRODUODENOSCOPY (EGD) WITH PROPOFOL;  Surgeon: Toledo, Benay Pike, MD;  Location: [facility] ENDOSCOPY;  Service: Gastroenterology;  Laterality: N/A;   MASTECTOMY     SHOULDER ARTHROSCOPY WITH SUBACROMIAL DECOMPRESSION, ROTATOR CUFF REPAIR AND BICEP TENDON REPAIR Left 01/13/2008   TONSILLECTOMY     TUBAL LIGATION      Social History   Socioeconomic History   Marital status: Married    Spouse name: Not on file   Number of children: Not on file   Years of education: Not on file   Highest education level: Not on file  Occupational History   Not on file  Tobacco Use   Smoking status: Current Every Day Smoker    Packs/day: 1.50    Years: 40.00    Pack years: 60.00    Types: Cigarettes   Smokeless tobacco: Never Used  Vaping Use   Vaping Use: Never used  Substance and Sexual Activity   Alcohol use: No   Drug use: No   Sexual activity: Yes    Partners: Male  Other Topics Concern   Not on file  Social History Narrative   Lives at home with husband   Social Determinants of Health   Financial Resource Strain:    Difficulty of Paying Living Expenses:   Food Insecurity:    Worried About Charity fundraiser in the Last Year:    Ran Out of Food in the Last Year:   Transportation Needs:  Lack of Transportation (Medical):    Lack of Transportation (Non-Medical):   Physical Activity:    Days of Exercise per Week:    Minutes of Exercise per Session:   Stress:    Feeling of Stress :   Social Connections:    Frequency of Communication with Friends and Family:    Frequency of Social Gatherings with Friends and Family:    Attends Religious Services:    Active Member of Clubs or Organizations:    Attends Music therapist:    Marital Status:   Intimate Partner Violence:    Fear of Current or Ex-Partner:    Emotionally Abused:    Physically Abused:     Sexually Abused:     Family History  Problem Relation Age of Onset   Cancer Father    Kidney failure Mother    Allergies  Allergen Reactions   Soy Protein  [Soybean Oil] Rash   Wheat Bran Rash   Amoxicillin-Pot Clavulanate Diarrhea and Rash    Other reaction(s): Unknown   Biaxin [Clarithromycin] Diarrhea    Other reaction(s): Unknown   Oxycodone Itching   Codeine Rash   Nitrofurantoin Rash   Soy Allergy Rash    Current Facility-Administered Medications  Medication Dose Route Frequency Provider Last Rate Last Admin   acetaminophen (TYLENOL) tablet 1,000 mg  1,000 mg Oral Q6H Piscoya, Jose, MD   1,000 mg at 07/17/19 1939   ciprofloxacin (CIPRO) IVPB 400 mg  400 mg Intravenous Q12H Olean Ree, MD   Stopped at 07/18/19 1478   And   metroNIDAZOLE (FLAGYL) IVPB 500 mg  500 mg Intravenous Q8H Piscoya, Jose, MD 100 mL/hr at 07/18/19 0800 Rate Verify at 07/18/19 0800   diphenhydrAMINE (BENADRYL) 12.5 MG/5ML elixir 12.5 mg  12.5 mg Oral Q6H PRN Piscoya, Jose, MD       enoxaparin (LOVENOX) injection 40 mg  40 mg Subcutaneous Q24H Piscoya, Jose, MD   40 mg at 07/18/19 2956   feeding supplement (BOOST / RESOURCE BREEZE) liquid 1 Container  1 Container Oral TID BM Tylene Fantasia, PA-C       HYDROmorphone (DILAUDID) injection 0.5 mg  0.5 mg Intravenous Q4H PRN Piscoya, Jose, MD       lactated ringers infusion   Intravenous Continuous Piscoya, Jose, MD 75 mL/hr at 07/18/19 0800 Rate Verify at 07/18/19 0800   ondansetron (ZOFRAN-ODT) disintegrating tablet 4 mg  4 mg Oral Q6H PRN Piscoya, Jacqulyn Bath, MD       Or   ondansetron (ZOFRAN) injection 4 mg  4 mg Intravenous Q6H PRN Piscoya, Jose, MD   4 mg at 07/18/19 0830   pantoprazole (PROTONIX) injection 40 mg  40 mg Intravenous QHS Piscoya, Jose, MD   40 mg at 07/17/19 2142   polyethylene glycol (MIRALAX / GLYCOLAX) packet 17 g  17 g Oral Daily PRN Piscoya, Jose, MD       promethazine (PHENERGAN) injection 12.5 mg  12.5  mg Intravenous Q6H PRN Piscoya, Jose, MD         Abtx:  Anti-infectives (From admission, onward)   Start     Dose/Rate Route Frequency Ordered Stop   07/17/19 0600  ciprofloxacin (CIPRO) IVPB 400 mg     Discontinue    "And" Linked Group Details   400 mg 200 mL/hr over 60 Minutes Intravenous Every 12 hours 07/17/19 0558     07/17/19 0600  metroNIDAZOLE (FLAGYL) IVPB 500 mg     Discontinue    "And" Linked Group Details  500 mg 100 mL/hr over 60 Minutes Intravenous Every 8 hours 07/17/19 0558     07/17/19 0515  piperacillin-tazobactam (ZOSYN) IVPB 3.375 g        3.375 g 100 mL/hr over 30 Minutes Intravenous  Once 07/17/19 0509 07/17/19 0558      REVIEW OF SYSTEMS:  Const: negative fever, negative chills, negative weight loss Eyes: negative diplopia or visual changes, negative eye pain ENT: negative coryza, negative sore throat Resp: negative cough, hemoptysis, dyspnea Cards: negative for chest pain, palpitations, lower extremity edema GU: negative for frequency, dysuria and hematuria GI: Has abdominal pain and since December 2020 has had loose stools and was seen by Dr. Alice Reichert at [place] [facility].  Was treated with Cipro and Flagyl in December.  Then underwent colonoscopy in April 2021 Skin: Severe eczematous rash over the face elbows thighs back present since childhood Heme: negative for easy bruising and gum/nose bleeding MS: Generalized weakness Neurolo:negative for headaches, dizziness, vertigo, memory problems  Psych: negative for feelings of anxiety, depression  Endocrine: No diabetes Allergy/Immunology-multiple allergies as listed above Objective:  VITALS:  BP 100/62 (BP Location: Left Arm)    Pulse 66    Temp (!) 97.4 F (36.3 C) (Oral)    Resp 18    Ht 5\' 2"  (1.575 m)    Wt 60.8 kg    SpO2 98%    BMI 24.51 kg/m  PHYSICAL EXAM:  General: Alert, cooperative, no distress, appears stated age.  Chronically ill Head: Normocephalic, without obvious abnormality,  atraumatic. Eyes: Conjunctivae clear, anicteric sclerae. Pupils are equal ENT Nares normal. No drainage or sinus tenderness. Lips, mucosa, and tongue normal. No Thrush Neck: Supple, symmetrical, no adenopathy, thyroid: non tender no carotid bruit and no JVD. Back: No CVA tenderness. Lungs: Clear to auscultation bilaterally. No Wheezing or Rhonchi. No rales. Heart: Regular rate and rhythm, no murmur, rub or gallop. Abdomen: Soft, non-tender,not distended. Bowel sounds normal. No masses. Left lower quadrant drain present Extremities: atraumatic, no cyanosis. No edema. No clubbing Skin: Dry scaly rash over the forehead, cheeks nasolabial fold around the lips and in the ears  Dry discoid patch over the thighs Dry scaly patch over the elbows Dryness of the skin over the back\           lymph: Cervical, supraclavicular normal. Neurologic: Grossly non-focal Pertinent Labs Lab Results CBC    Component Value Date/Time   WBC 6.6 07/18/2019 0410   RBC 4.24 07/18/2019 0410   HGB 12.8 07/18/2019 0410   HCT 39.7 07/18/2019 0410   PLT 217 07/18/2019 0410   MCV 93.6 07/18/2019 0410   MCH 30.2 07/18/2019 0410   MCHC 32.2 07/18/2019 0410   RDW 13.3 07/18/2019 0410   LYMPHSABS 1.1 07/18/2019 0410   MONOABS 0.4 07/18/2019 0410   EOSABS 0.2 07/18/2019 0410   BASOSABS 0.1 07/18/2019 0410    CMP Latest Ref Rng & Units 07/18/2019 07/17/2019 07/03/2019  Glucose 70 - 99 mg/dL 81 111(H) 77  BUN 8 - 23 mg/dL 9 9 <5(L)  Creatinine 0.44 - 1.00 mg/dL 0.80 0.90 0.87  Sodium 135 - 145 mmol/L 139 139 138  Potassium 3.5 - 5.1 mmol/L 3.7 3.9 3.8  Chloride 98 - 111 mmol/L 102 98 103  CO2 22 - 32 mmol/L 30 31 28   Calcium 8.9 - 10.3 mg/dL 8.6(L) 9.3 8.2(L)  Total Protein 6.5 - 8.1 g/dL - - -  Total Bilirubin 0.3 - 1.2 mg/dL - - -  Alkaline Phos 38 - 126 U/L - - -  AST 15 - 41 U/L - - -  ALT 0 - 44 U/L - - -      Microbiology: Recent Results (from the past 240 hour(s))  Urine culture      Status: Abnormal (Preliminary result)   Collection Time: 07/17/19  6:12 AM   Specimen: Urine, Clean Catch  Result Value Ref Range Status   Specimen Description   Final    URINE, CLEAN CATCH Performed at [facility], [address], [place], [place] [address]    Special Requests   Final    NONE Performed at [facility], [place], [place] [address]    Culture 40,000 COLONIES/mL ESCHERICHIA COLI (A)  Final   Report Status PENDING  Incomplete  SARS Coronavirus 2 by RT PCR (hospital order, performed in [place] hospital lab) Nasopharyngeal Nasopharyngeal Swab     Status: None   Collection Time: 07/17/19  6:13 AM   Specimen: Nasopharyngeal Swab  Result Value Ref Range Status   SARS Coronavirus 2 NEGATIVE NEGATIVE Final    Comment: (NOTE) SARS-CoV-2 target nucleic acids are NOT DETECTED.  The SARS-CoV-2 RNA is generally detectable in upper and lower respiratory specimens during the acute phase of infection. The lowest concentration of SARS-CoV-2 viral copies this assay can detect is 250 copies / mL. A negative result does not preclude SARS-CoV-2 infection and should not be used as the sole basis for treatment or other patient management decisions.  A negative result may occur with improper specimen collection / handling, submission of specimen other than nasopharyngeal swab, presence of viral mutation(s) within the areas targeted by this assay, and inadequate number of viral copies (<250 copies / mL). A negative result must be combined with clinical observations, patient history, and epidemiological information.  Fact Sheet for Patients:   StrictlyIdeas.no  Fact Sheet for Healthcare Providers: BankingDealers.co.za  This test is not yet approved or  cleared by the [place] FDA and has been authorized for detection and/or diagnosis of SARS-CoV-2 by FDA under an Emergency Use Authorization  (EUA).  This EUA will remain in effect (meaning this test can be used) for the duration of the COVID-19 declaration under Section 564(b)(1) of the Act, 21 U.S.C. section 360bbb-3(b)(1), unless the authorization is terminated or revoked sooner.  Performed at [facility], [address] [place], [place] [address]     IMAGING RESULTS: 06/30/19  Acute diverticulitis of the proximal sigmoid colon. Regional inflammatory change including a 3 cm adjacent abscess.   07/17/19 New site of early diverticulitis along the descending colon,uncomplicated. 2. No residual abscess at the site of percutaneous drainage catheter. Inflammation in this area is stable  I have personally reviewed the films ? Impression/Recommendation ? ?Sigmoid diverticulitis with peridiverticular abscess 614 02/22/2016 status post drainage.  Enterococcus avium the culture. Now with acute descending colon diverticulitis. Currently on Cipro and Flagyl.  We will change Cipro to meropenem.  Severe eczema since childhood.  There is a combination of seborrheic dermatitis versus atopic eczema.  What she describes as drug reaction is very likely flareup of the eczema.  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 The eyelid swelling could have been a result of an eczematous reaction.  Cannot say this is antibiotic allergies.  But as Cipro and Flagyl does not work for Enterococcus will change it into meropenem.  Current smoker.  Told her that to quit smoking to help heal the infection.  Recommend getting a nutritionist to discuss diet during diverticulitis  and in between as patient and her husband not sure of what she should be eating.  Discussed the management with the patient and her husband.   ID will follow peripherally this weekend call if needed.  Note:  This document was prepared using Dragon voice recognition software and may include unintentional dictation errors.

## 2019-07-18 NOTE — Consult Note (Addendum)
Pharmacy Antibiotic Note  Brianna Briggs is a 69 y.o. female admitted on 07/17/2019 withdescending colon diverticulitis.  Pharmacy has been consulted for meropenem dosing due to having multiple drug allergies following an evaluation by the infectious disease physician. She has a recent h/o Enterococcus avium in a peridiverticular culture.   Plan:    Start meropenem 1 gram IV every 8 hours  Height: 5\' 2"  (157.5 cm) Weight: 60.8 kg (134 lb) IBW/kg (Calculated) : 50.1  Temp (24hrs), Avg:97.7 F (36.5 C), Min:97.4 F (36.3 C), Max:97.9 F (36.6 C)  Recent Labs  Lab 07/17/19 0311 07/18/19 0410  WBC 11.8* 6.6  CREATININE 0.90 0.80    Estimated Creatinine Clearance: 57 mL/min (by C-G formula based on SCr of 0.8 mg/dL).    Allergies  Allergen Reactions  . Soy Protein  [Soybean Oil] Rash  . Wheat Bran Rash  . Amoxicillin-Pot Clavulanate Diarrhea and Rash    Other reaction(s): Unknown  . Biaxin [Clarithromycin] Diarrhea    Other reaction(s): Unknown  . Oxycodone Itching  . Codeine Rash  . Nitrofurantoin Rash  . Soy Allergy Rash    Antimicrobials this admission: ciprofloxacin 7/1 >> 7/2 metronidazole 7/1 >> 7/2 meropenem 7/2 >>   Microbiology results: 7/1 UCx: E coli (40k CFU)  6/15 peridiverticular abscess Cx: E avium 7/1 SARS CoV-2: negative  7/2 C diff: pending  Thank you for allowing pharmacy to be a part of this patient's care.  Dallie Piles 07/18/2019 1:26 PM

## 2019-07-18 NOTE — Clinical Social Work Note (Signed)
CSW acknowledges consult for medication assistance. CSW was consulted for this as well during last admission. Per note on 6/17:  "CSW acknowledges consult for medication assistance. Patient has Medicare and a BCBS supplement. Patient reports that [place] which her dermatologist prescribed for her Eczema is over $500. Surgery PA and pharmacist agreed that patient will need to follow up with her dermatologist to see if there are any cheaper options. CSW looked up GoodRx coupon but prices are between $600-$700. Patient has been updated."  Dayton Scrape, [place]

## 2019-07-18 NOTE — Progress Notes (Signed)
Called Dr. Peyton Najjar regarding pt complaint of swelling. See new orders in [facility]

## 2019-07-19 LAB — URINE CULTURE: Culture: 40000 — AB

## 2019-07-19 MED ORDER — SODIUM CHLORIDE 0.9% FLUSH
5.0000 mL | Freq: Two times a day (BID) | INTRAVENOUS | Status: DC
  Administered 2019-07-19 – 2019-07-23 (×9): 5 mL via INTRAVENOUS

## 2019-07-19 NOTE — Progress Notes (Signed)
NGT pulled out to 45 cm. X Ray ordered to confirm placement.    MD Pabon made aware of results.  NGT connected back to suction.  Suction working with no complications.

## 2019-07-19 NOTE — Progress Notes (Signed)
CC: Diverticulitis Subjective: She feels a little bit better.  She does have recurrent diverticulitis in a different area proximal to the prior diverticular abscess. Antibiotics have changed to meropenem.  She grew enterococci 15 cc from drain.  Objective: Vital signs in last 24 hours: Temp:  [98 F (36.7 C)] 98 F (36.7 C) (07/03 0429) Pulse Rate:  [70-77] 77 (07/03 0429) Resp:  [16-20] 20 (07/03 0429) BP: (108-115)/(56-73) 108/56 (07/03 0429) SpO2:  [93 %-95 %] 93 % (07/03 0429) Last BM Date: 07/19/19  Intake/Output from previous day: 07/02 0701 - 07/03 0700 In: 4158.2 [P.O.:180; I.V.:3184.9; IV Piggyback:788.3] Out: 1015 [Urine:1000; Drains:15] Intake/Output this shift: Total I/O In: 330 [P.O.:330] Out: -   Physical exam:  NAd alert Abd: soft, mild TTP diffusely, no peritonitis. Serous drain on JP. Ext; no edema and well perfused  Lab Results: CBC  Recent Labs    07/17/19 0311 07/18/19 0410  WBC 11.8* 6.6  HGB 15.9* 12.8  HCT 48.6* 39.7  PLT 323 217   BMET Recent Labs    07/17/19 0311 07/18/19 0410  NA 139 139  K 3.9 3.7  CL 98 102  CO2 31 30  GLUCOSE 111* 81  BUN 9 9  CREATININE 0.90 0.80  CALCIUM 9.3 8.6*   PT/INR No results for input(s): LABPROT, INR in the last 72 hours. ABG No results for input(s): PHART, HCO3 in the last 72 hours.  Invalid input(s): PCO2, PO2  Studies/Results: No results found.  Anti-infectives: Anti-infectives (From admission, onward)   Start     Dose/Rate Route Frequency Ordered Stop   07/18/19 1500  meropenem (MERREM) 1 g in sodium chloride 0.9 % 100 mL IVPB     Discontinue     1 g 200 mL/hr over 30 Minutes Intravenous Every 8 hours 07/18/19 1333     07/17/19 0600  ciprofloxacin (CIPRO) IVPB 400 mg  Status:  Discontinued       "And" Linked Group Details   400 mg 200 mL/hr over 60 Minutes Intravenous Every 12 hours 07/17/19 0558 07/18/19 1323   07/17/19 0600  metroNIDAZOLE (FLAGYL) IVPB 500 mg  Status:   Discontinued       "And" Linked Group Details   500 mg 100 mL/hr over 60 Minutes Intravenous Every 8 hours 07/17/19 0558 07/18/19 1323   07/17/19 0515  piperacillin-tazobactam (ZOSYN) IVPB 3.375 g        3.375 g 100 mL/hr over 30 Minutes Intravenous  Once 07/17/19 0509 07/17/19 0558      Assessment/Plan:  ReCurrent diverticulitis. No surgical intervention at this time.  Advance to full liquid diet. Continue antibiotic therapy. She will benefit from elective sigmoid colectomy in the near future. Is note that his pain more than 35 minutes in this encounter with greater than 50% spent in coordination and counseling of her care  Caroleen Hamman, MD, [facility]  07/19/2019

## 2019-07-20 LAB — COMPREHENSIVE METABOLIC PANEL
ALT: 14 U/L (ref 0–44)
AST: 26 U/L (ref 15–41)
Albumin: 2.5 g/dL — ABNORMAL LOW (ref 3.5–5.0)
Alkaline Phosphatase: 53 U/L (ref 38–126)
Anion gap: 7 (ref 5–15)
BUN: <5 mg/dL — ABNORMAL LOW (ref 8–23)
CO2: 33 mmol/L — ABNORMAL HIGH (ref 22–32)
Calcium: 8.3 mg/dL — ABNORMAL LOW (ref 8.9–10.3)
Chloride: 101 mmol/L (ref 98–111)
Creatinine, Ser: 0.78 mg/dL (ref 0.44–1.00)
GFR calc Af Amer: >60 mL/min (ref >60)
GFR calc non Af Amer: >60 mL/min (ref >60)
Glucose, Bld: 88 mg/dL (ref 70–99)
Potassium: 3.5 mmol/L (ref 3.5–5.1)
Sodium: 141 mmol/L (ref 135–145)
Total Bilirubin: 0.4 mg/dL (ref 0.3–1.2)
Total Protein: 5.7 g/dL — ABNORMAL LOW (ref 6.5–8.1)

## 2019-07-20 LAB — CBC
HCT: 38 % (ref 36.0–46.0)
Hemoglobin: 12.9 g/dL (ref 12.0–15.0)
MCH: 30.7 pg (ref 26.0–34.0)
MCHC: 33.9 g/dL (ref 30.0–36.0)
MCV: 90.5 fL (ref 80.0–100.0)
Platelets: 230 10*3/uL (ref 150–400)
RBC: 4.2 MIL/uL (ref 3.87–5.11)
RDW: 13.4 % (ref 11.5–15.5)
WBC: 5.7 10*3/uL (ref 4.0–10.5)
nRBC: 0 % (ref 0.0–0.2)

## 2019-07-20 MED ORDER — SODIUM CHLORIDE 0.9 % IV SOLN
INTRAVENOUS | Status: DC | PRN
  Administered 2019-07-20 – 2019-07-22 (×5): 250 mL via INTRAVENOUS

## 2019-07-20 NOTE — Progress Notes (Signed)
CC: Diverticulitis Subjective: She feels a bit better.  No fevers no chills.  Drain with some persistent purulent drainage.  JP 10 cc. Tolerated full liquids.  Objective: Vital signs in last 24 hours: Temp:  [97.7 F (36.5 C)-98.2 F (36.8 C)] 97.7 F (36.5 C) (07/04 1138) Pulse Rate:  [62-71] 62 (07/04 1138) Resp:  [14-20] 14 (07/04 0327) BP: (96-113)/(61-78) 113/78 (07/04 1138) SpO2:  [93 %-97 %] 97 % (07/04 1138) Last BM Date: 07/20/19  Intake/Output from previous day: 07/03 0701 - 07/04 0700 In: 12 [P.O.:810] Out: 1610 [Urine:1600; Drains:10] Intake/Output this shift: Total I/O In: 5 [I.V.:5] Out: 400 [Urine:400]  Physical exam: NAD, alert Abd: soft, minimal TTP w/o peritonitis. JP w some purulent fluid Ext; well perfused and warm   Lab Results: CBC  Recent Labs    07/18/19 0410 07/20/19 0423  WBC 6.6 5.7  HGB 12.8 12.9  HCT 39.7 38.0  PLT 217 230   BMET Recent Labs    07/18/19 0410 07/20/19 0423  NA 139 141  K 3.7 3.5  CL 102 101  CO2 30 33*  GLUCOSE 81 88  BUN 9 <5*  CREATININE 0.80 0.78  CALCIUM 8.6* 8.3*   PT/INR No results for input(s): LABPROT, INR in the last 72 hours. ABG No results for input(s): PHART, HCO3 in the last 72 hours.  Invalid input(s): PCO2, PO2  Studies/Results: No results found.  Anti-infectives: Anti-infectives (From admission, onward)   Start     Dose/Rate Route Frequency Ordered Stop   07/18/19 1500  meropenem (MERREM) 1 g in sodium chloride 0.9 % 100 mL IVPB     Discontinue     1 g 200 mL/hr over 30 Minutes Intravenous Every 8 hours 07/18/19 1333     07/17/19 0600  ciprofloxacin (CIPRO) IVPB 400 mg  Status:  Discontinued       "And" Linked Group Details   400 mg 200 mL/hr over 60 Minutes Intravenous Every 12 hours 07/17/19 0558 07/18/19 1323   07/17/19 0600  metroNIDAZOLE (FLAGYL) IVPB 500 mg  Status:  Discontinued       "And" Linked Group Details   500 mg 100 mL/hr over 60 Minutes Intravenous Every 8  hours 07/17/19 0558 07/18/19 1323   07/17/19 0515  piperacillin-tazobactam (ZOSYN) IVPB 3.375 g        3.375 g 100 mL/hr over 30 Minutes Intravenous  Once 07/17/19 0509 07/17/19 0558      Assessment/Plan:  Chronic diverticulitis more proximal descending colon.  We will obtain a CT scan of the abdomen pelvis tomorrow and then decide whether or not the drain can be removed.  Will also have to touch base with ID regarding plans for outpatient antibiotic therapy. We will continue meropenem for now.  No need for surgical intervention.  We will advance her diet to low residue diet. Anticipate discharge 24 to 48 hours  Caroleen Hamman, MD, [facility]  07/20/2019

## 2019-07-21 ENCOUNTER — Encounter: Payer: Self-pay | Admitting: Surgery

## 2019-07-21 ENCOUNTER — Inpatient Hospital Stay: Payer: Medicare Other

## 2019-07-21 LAB — CBC
HCT: 42.3 % (ref 36.0–46.0)
Hemoglobin: 14.4 g/dL (ref 12.0–15.0)
MCH: 30.4 pg (ref 26.0–34.0)
MCHC: 34 g/dL (ref 30.0–36.0)
MCV: 89.4 fL (ref 80.0–100.0)
Platelets: 229 10*3/uL (ref 150–400)
RBC: 4.73 MIL/uL (ref 3.87–5.11)
RDW: 13.4 % (ref 11.5–15.5)
WBC: 5.9 10*3/uL (ref 4.0–10.5)
nRBC: 0 % (ref 0.0–0.2)

## 2019-07-21 MED ORDER — IOHEXOL 300 MG/ML  SOLN
100.0000 mL | Freq: Once | INTRAMUSCULAR | Status: AC | PRN
  Administered 2019-07-21: 100 mL via INTRAVENOUS

## 2019-07-21 MED ORDER — IOHEXOL 9 MG/ML PO SOLN
500.0000 mL | ORAL | Status: AC
  Administered 2019-07-21 (×2): 500 mL via ORAL

## 2019-07-21 NOTE — Progress Notes (Signed)
CC: Diverticulitis Subjective: CT scan this morning shows persistent diverticulitis, but no residual fluid around drain. Patient states she was feeling very well until she had to drink the contrast for the scan.  Starting to feel better again, after eating lunch.  Objective: Vital signs in last 24 hours: Temp:  [97.7 F (36.5 C)-98.6 F (37 C)] 98.5 F (36.9 C) (07/05 1207) Pulse Rate:  [53-70] 61 (07/05 1207) Resp:  [15-16] 16 (07/05 1207) BP: (98-126)/(59-96) 104/69 (07/05 1207) SpO2:  [95 %-98 %] 95 % (07/05 1207) Last BM Date: 07/20/19  Intake/Output from previous day: 07/04 0701 - 07/05 0700 In: 866.9 [P.O.:240; I.V.:16.9; IV Piggyback:600] Out: 9449 [Urine:1800; Drains:5] Intake/Output this shift: Total I/O In: 0  Out: 900 [Urine:900]  Physical exam: NAD, alert Abd: soft, minimal TTP w/o peritonitis. JP without any fluid in bulb, small residue in tubing.    Lab Results: CBC  Recent Labs    07/20/19 0423 07/21/19 0555  WBC 5.7 5.9  HGB 12.9 14.4  HCT 38.0 42.3  PLT 230 229   BMET Recent Labs    07/20/19 0423  NA 141  K 3.5  CL 101  CO2 33*  GLUCOSE 88  BUN <5*  CREATININE 0.78  CALCIUM 8.3*   PT/INR No results for input(s): LABPROT, INR in the last 72 hours. ABG No results for input(s): PHART, HCO3 in the last 72 hours.  Invalid input(s): PCO2, PO2  Studies/Results: CT ABDOMEN PELVIS W CONTRAST  Result Date: 07/21/2019 CLINICAL DATA:  Diverticulitis, chronic, nausea and vomiting, percutaneous drainage EXAM: CT ABDOMEN AND PELVIS WITH CONTRAST TECHNIQUE: Multidetector CT imaging of the abdomen and pelvis was performed using the standard protocol following bolus administration of intravenous contrast. CONTRAST:  124mL OMNIPAQUE IOHEXOL 300 MG/ML SOLN, additional oral enteric contrast COMPARISON:  07/17/2019 FINDINGS: Lower chest: No acute abnormality. Hepatobiliary: No solid liver abnormality is seen. Hepatic steatosis. No gallstones, gallbladder  wall thickening, or biliary dilatation. Pancreas: Unremarkable. No pancreatic ductal dilatation or surrounding inflammatory changes. Spleen: Normal in size without significant abnormality. Adrenals/Urinary Tract: Adrenal glands are unremarkable. 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 Incidental cystocele. The bladder is otherwise unremarkable. Stomach/Bowel: Stomach is within normal limits. Appendix appears normal. Descending and sigmoid diverticulosis with persistent thickening of the proximal to mid sigmoid colon. There is a percutaneous pigtail drainage catheter positioned about the lateral inferior aspect of the mid sigmoid colon. There is no appreciable persistent fluid collection about the catheter. Vascular/Lymphatic: Aortic atherosclerosis. No enlarged abdominal or pelvic lymph nodes. Reproductive: Status post hysterectomy. Other: No abdominal wall hernia or abnormality. No abdominopelvic ascites. Musculoskeletal: No acute or significant osseous findings. IMPRESSION: 1. Descending and sigmoid diverticulosis with persistent thickening of the proximal to mid sigmoid colon, consistent with diverticulitis. 2. There is a percutaneous pigtail drainage catheter positioned about the lateral inferior aspect of the mid sigmoid colon. There is no appreciable persistent fluid collection about the catheter. 3. Hepatic steatosis. 4. Aortic Atherosclerosis (ICD10-I70.0). Electronically Signed   By: Eddie Candle M.D.   On: 07/21/2019 12:17    Anti-infectives: Anti-infectives (From admission, onward)   Start     Dose/Rate Route Frequency Ordered Stop   07/18/19 1500  meropenem (MERREM) 1 g in sodium chloride 0.9 % 100 mL IVPB     Discontinue     1 g 200 mL/hr over 30 Minutes Intravenous Every 8 hours 07/18/19 1333     07/17/19 0600  ciprofloxacin (CIPRO) IVPB 400 mg  Status:  Discontinued       "And" Linked Group Details    400 mg 200 mL/hr over 60 Minutes Intravenous Every 12 hours 07/17/19 0558 07/18/19 1323   07/17/19 0600  metroNIDAZOLE (FLAGYL) IVPB 500 mg  Status:  Discontinued       "And" Linked Group Details   500 mg 100 mL/hr over 60 Minutes Intravenous Every 8 hours 07/17/19 0558 07/18/19 1323   07/17/19 0515  piperacillin-tazobactam (ZOSYN) IVPB 3.375 g        3.375 g 100 mL/hr over 30 Minutes Intravenous  Once 07/17/19 0509 07/17/19 0558      Assessment/Plan:  Chronic diverticulitis more proximal descending colon.  Drain removed today. We will continue meropenem for now, but will need ID recommendations for a home regiman..  No need for surgical intervention at this time. Anticipate discharge tomorrow.    07/21/2019

## 2019-07-21 NOTE — Progress Notes (Signed)
PHARMACY CONSULT NOTE FOR:  OUTPATIENT  PARENTERAL ANTIBIOTIC THERAPY (OPAT)  Indication: Acute Diverticulitis Regimen: Meropenem 1g q8h End date: 07/28/2019  IV antibiotic discharge orders are pended. To discharging provider:  please sign these orders via discharge navigator,  Select New Orders & click on the button choice - Manage This Unsigned Work.     Thank you for allowing pharmacy to be a part of this patient's care.  Lu Duffel, PharmD, BCPS Clinical Pharmacist 07/21/2019 7:45 PM

## 2019-07-21 NOTE — Consult Note (Signed)
Pharmacy Antibiotic Note  Brianna Briggs is a 69 y.o. female admitted on 07/17/2019 withdescending colon diverticulitis.  Pharmacy has been consulted for meropenem dosing due to having multiple drug allergies following an evaluation by the infectious disease physician. She has a recent h/o Enterococcus avium in a peridiverticular culture.   Plan:    Continue meropenem 1 gram IV every 8 hours  Height: 5\' 2"  (157.5 cm) Weight: 60.8 kg (134 lb) IBW/kg (Calculated) : 50.1  Temp (24hrs), Avg:98 F (36.7 C), Min:97.7 F (36.5 C), Max:98.6 F (37 C)  Recent Labs  Lab 07/17/19 0311 07/18/19 0410 07/20/19 0423 07/21/19 0555  WBC 11.8* 6.6 5.7 5.9  CREATININE 0.90 0.80 0.78  --     Estimated Creatinine Clearance: 57 mL/min (by C-G formula based on SCr of 0.78 mg/dL).    Allergies  Allergen Reactions  . Soy Protein  [Soybean Oil] Rash  . Wheat Bran Rash  . Amoxicillin-Pot Clavulanate Diarrhea and Rash    Other reaction(s): Unknown  . Biaxin [Clarithromycin] Diarrhea    Other reaction(s): Unknown  . Oxycodone Itching  . Codeine Rash  . Nitrofurantoin Rash  . Soy Allergy Rash    Antimicrobials this admission: ciprofloxacin 7/1 >> 7/2 metronidazole 7/1 >> 7/2 meropenem 7/2 >>   Microbiology results: 7/1 UCx: E coli (40k CFU)  6/15 peridiverticular abscess Cx: E avium 7/1 SARS CoV-2: negative  7/2 C diff: pending  Thank you for allowing pharmacy to be a part of this patient's care.  Lu Duffel, PharmD, BCPS Clinical Pharmacist 07/21/2019 8:58 AM

## 2019-07-21 NOTE — Treatment Plan (Signed)
Diagnosis: Acute diverticulitis  Baseline Creatinine < 1    Allergies  Allergen Reactions  . Soy Protein  [Soybean Oil] Rash  . Wheat Bran Rash  . Amoxicillin-Pot Clavulanate Diarrhea and Rash    Other reaction(s): Unknown  . Biaxin [Clarithromycin] Diarrhea    Other reaction(s): Unknown  . Oxycodone Itching  . Codeine Rash  . Nitrofurantoin Rash  . Soy Allergy Rash    OPAT Orders Discharge antibiotics: Meropenem 1 gram Iv every 8 hours   End Date: 07/28/19  PIC?midline Care Per Protocol:  Labs weekly while on IV antibiotics: _X_ CBC with differential  X__ CMP   _X_ Please pull PIC at completion of IV antibiotics Fax weekly labs to [phone]  Clinic Follow Up Appt:1 week   Call [phone] to make appt

## 2019-07-21 NOTE — Clinical Social Work Note (Signed)
CSW was notified by ID Pharmacist of plan to discharge home on IV antibiotics. CSW spoke with Carolynn Sayers with [facility] to provide needed information. CSW will meet with patient tomorrow to discuss which home health agency she wants.  Dayton Scrape, [place]

## 2019-07-21 NOTE — Progress Notes (Signed)
Pt was doing well until she took contrast Po for the CT Stared having some abdominal pain and diarrhea O/e awake and alert Patient Vitals for the past 24 hrs:  BP Temp Temp src Pulse Resp SpO2  07/21/19 1207 104/69 98.5 F (36.9 C) Oral 61 16 95 %  07/21/19 0428 (!) 126/96 97.7 F (36.5 C) Oral 70 16 98 %  07/20/19 1934 (!) 98/59 98.6 F (37 C) Oral (!) 53 15 97 %   Facial eczematous rash better Chest CTA HSs1s2 Abd soft Left Lq drain removed   Labs CBC Latest Ref Rng & Units 07/21/2019 07/20/2019 07/18/2019  WBC 4.0 - 10.5 K/uL 5.9 5.7 6.6  Hemoglobin 12.0 - 15.0 g/dL 14.4 12.9 12.8  Hematocrit 36 - 46 % 42.3 38.0 39.7  Platelets 150 - 400 K/uL 229 230 217     CMP Latest Ref Rng & Units 07/20/2019 07/18/2019 07/17/2019  Glucose 70 - 99 mg/dL 88 81 111(H)  BUN 8 - 23 mg/dL <5(L) 9 9  Creatinine 0.44 - 1.00 mg/dL 0.78 0.80 0.90  Sodium 135 - 145 mmol/L 141 139 139  Potassium 3.5 - 5.1 mmol/L 3.5 3.7 3.9  Chloride 98 - 111 mmol/L 101 102 98  CO2 22 - 32 mmol/L 33(H) 30 31  Calcium 8.9 - 10.3 mg/dL 8.3(L) 8.6(L) 9.3  Total Protein 6.5 - 8.1 g/dL 5.7(L) - -  Total Bilirubin 0.3 - 1.2 mg/dL 0.4 - -  Alkaline Phos 38 - 126 U/L 53 - -  AST 15 - 41 U/L 26 - -  ALT 0 - 44 U/L 14 - -    Impression/recommendation Sigmoid diverticulitis with peridiverticular abscess 6/14  status post drainage.  Enterococcus avium the culture. Now with acute descending colon diverticulitis. on meropenem.  Severe eczema since childhood.  There is a combination of seborrheic dermatitis versus atopic eczema.  What she describes as drug reaction is very likely flareup of the eczema.  Her face has evidence of atopic  eczema. The eyelid swelling could have been a result of an eczematous reaction.  Cannot say this is antibiotic allergies.  But as Cipro and Flagyl does not work for Enterococcus changed to meropenem. She will need for a week  Amoxicillin/clavulunate - ? Rash or was it eczema  Current smoker.   Told her that to quit smoking to help heal the infection.  Recommend getting a nutritionist to discuss diet during diverticulitis and in between as patient and her husband not sure of what she should be eating.  Discussed the management with the patient and Dr.Pabon

## 2019-07-21 NOTE — Care Management Important Message (Signed)
Important Message  Patient Details  Name: Brianna Briggs MRN: [id number] Date of Birth: 25-Jun-1950   Medicare Important Message Given:  Yes     [name] 07/21/2019, 11:12 AM

## 2019-07-22 ENCOUNTER — Inpatient Hospital Stay: Payer: Self-pay

## 2019-07-22 LAB — BASIC METABOLIC PANEL
Anion gap: 9 (ref 5–15)
BUN: 6 mg/dL — ABNORMAL LOW (ref 8–23)
CO2: 32 mmol/L (ref 22–32)
Calcium: 8.4 mg/dL — ABNORMAL LOW (ref 8.9–10.3)
Chloride: 98 mmol/L (ref 98–111)
Creatinine, Ser: 0.75 mg/dL (ref 0.44–1.00)
GFR calc Af Amer: >60 mL/min (ref >60)
GFR calc non Af Amer: >60 mL/min (ref >60)
Glucose, Bld: 92 mg/dL (ref 70–99)
Potassium: 3.4 mmol/L — ABNORMAL LOW (ref 3.5–5.1)
Sodium: 139 mmol/L (ref 135–145)

## 2019-07-22 MED ORDER — SODIUM CHLORIDE 0.9% FLUSH: 10.0000 mL | INTRAVENOUS | Status: DC | PRN

## 2019-07-22 NOTE — Progress Notes (Signed)
[facility] [facility] SURGICAL PROGRESS NOTE (cpt [phone])  Hospital Day(s): 5.   Interval History: Patient seen and examined, no acute events or new complaints overnight. Patient reports she feels better aside from intermittent LLQ "twinges" in pain, denies fever, chills, nausea, or emesis. Labs are reassuring aside from mild hypokalemia to 3.4. She is currently on IV meropenem and ID with recommendation for continuation of this at home x1 week. Has not had PICC or home health established yet.   Review of Systems:  Constitutional: denies fever, chills  HEENT: denies cough or congestion  Respiratory: denies any shortness of breath  Cardiovascular: denies chest pain or palpitations  Gastrointestinal: + abdominal pain (mild, improved), denied N/V, or diarrhea/and bowel function as per interval history Genitourinary: denies burning with urination or urinary frequency  Vital signs in last 24 hours: [min-max] current  Temp:  [98 F (36.7 C)-98.5 F (36.9 C)] 98.1 F (36.7 C) (07/06 0425) Pulse Rate:  [51-61] 57 (07/06 0425) Resp:  [16-20] 20 (07/06 0425) BP: (104-108)/(67-69) 108/67 (07/06 0425) SpO2:  [93 %-96 %] 93 % (07/06 0425)     Height: 5\' 2"  (157.5 cm) Weight: 60.8 kg BMI (Calculated): 24.5   Intake/Output last 2 shifts:  07/05 0701 - 07/06 0700 In: 929.1 [P.O.:720; I.V.:9.1; IV Piggyback:200] Out: 2700 [Urine:2700]   Physical Exam:  Constitutional: alert, cooperative and no distress  HENT: normocephalic without obvious abnormality  Eyes: PERRL, EOM's grossly intact and symmetric  Respiratory: breathing non-labored at rest  Cardiovascular: regular rate and sinus rhythm  Gastrointestinal: Soft, very mild LLQ soreness, and non-distended. No rebound/guarding Musculoskeletal: no edema or wounds, motor and sensation grossly intact, NT    Labs:  CBC Latest Ref Rng & Units 07/21/2019 07/20/2019 07/18/2019  WBC 4.0 - 10.5 K/uL 5.9 5.7 6.6  Hemoglobin 12.0 - 15.0 g/dL 14.4 12.9  12.8  Hematocrit 36 - 46 % 42.3 38.0 39.7  Platelets 150 - 400 K/uL 229 230 217   CMP Latest Ref Rng & Units 07/22/2019 07/20/2019 07/18/2019  Glucose 70 - 99 mg/dL 92 88 81  BUN 8 - 23 mg/dL 6(L) <5(L) 9  Creatinine 0.44 - 1.00 mg/dL 0.75 0.78 0.80  Sodium 135 - 145 mmol/L 139 141 139  Potassium 3.5 - 5.1 mmol/L 3.4(L) 3.5 3.7  Chloride 98 - 111 mmol/L 98 101 102  CO2 22 - 32 mmol/L 32 33(H) 30  Calcium 8.9 - 10.3 mg/dL 8.4(L) 8.3(L) 8.6(L)  Total Protein 6.5 - 8.1 g/dL - 5.7(L) -  Total Bilirubin 0.3 - 1.2 mg/dL - 0.4 -  Alkaline Phos 38 - 126 U/L - 53 -  AST 15 - 41 U/L - 26 -  ALT 0 - 44 U/L - 14 -     Imaging studies: No new pertinent imaging studies   Assessment/Plan: (ICD-10's: K28.92) 69 y.o. female with clinically improved episode of descending colon diverticulitis with improvement in previously seen complicated diverticulitis of the sigmoid colon with abscess s/p percutaneous drainage on 06/15 (removed 07/05)    - Will place order for [place] as she will need IV Abx for home  - Continue Meropenem; appreciate ID assistance with this patient    - Pain control prn; antiemetics prn  - monitor abdominal examination  - No plans for surgical intervention this hospitalization; she will benefit from elective sigmoid colectomy as outpatient   - medical management of comorbid conditions   - mobilize             -  DVT prophylaxis   - Discharge Planning: Home vs SNF pending evaluation for home health and placement of PICC, hopefully tomorrow morning  All of the above findings and recommendations were discussed with the patient, and the medical team, and all of patient's questions were answered to her expressed satisfaction.  -- Edison Simon, PA-C [facility] 07/22/2019, 8:39 AM [phone] M-F: 7am - 4pm

## 2019-07-22 NOTE — TOC Initial Note (Addendum)
Transition of Care [facility]) - Initial/Assessment Note    Patient Details  Name: Brianna Briggs MRN: [id number] Date of Birth: [birth year]  [facility] CM/SW Contact:    Candie Chroman, LCSW Phone Number: 07/22/2019, 9:23 AM  Clinical Narrative:  CSW met with patient. Husband arrived at bedside later. CSW introduced role and explained that discharge planning would be discussed. Patient aware of plan for one week of IV abx. She expressed concerns due to state of home, her dogs, and frequent coughing. Patient concerned her picc will become infected or dislodged. Patient and her husband were renovating their home until she was laid off so her home is currently unfinished. Reviewed CMS scores for home health agencies. No preference. [facility] is reviewing. No further concerns. CSW encouraged patient to contact CSW as needed. CSW will continue to follow patient for support and facilitate return home when discharged.         10:40 am: [facility] unable to [facility]. [facility].  3:29 pm: [facility] unable to accept. [facility] has accepted referral. Start of care will be Thursday. Patient and husband are aware and agreeable. Surgical PA is aware and said plan for discharge tomorrow. Carolynn Sayers with [facility] will be here this afternoon for education.  Expected Discharge Plan: [place] Services Barriers to Discharge: Other (comment) (Finding home health agency to accept her)   Patient Goals and CMS Choice   CMS Medicare.gov Compare Post Acute Care list provided to:: Patient    Expected Discharge Plan and Services Expected Discharge Plan: [place] Choice: [place] arrangements for the past 2 months: Single Family Home                                      Prior Living Arrangements/Services Living arrangements for the past 2 months: Single Family Home Lives with:: Spouse Patient  language and need for interpreter reviewed:: Yes Do you feel safe going back to the place where you live?: Yes      Need for Family Participation in Patient Care: Yes (Comment) Care giver support system in place?: Yes (comment)   Criminal Activity/Legal Involvement Pertinent to Current Situation/Hospitalization: No - Comment as needed  Activities of Daily Living Home Assistive Devices/Equipment: Eyeglasses ADL Screening (condition at time of admission) Patient's cognitive ability adequate to safely complete daily activities?: Yes Is the patient deaf or have difficulty hearing?: No Does the patient have difficulty seeing, even when wearing glasses/contacts?: No Does the patient have difficulty concentrating, remembering, or making decisions?: No Patient able to express need for assistance with ADLs?: Yes Does the patient have difficulty dressing or bathing?: No Independently performs ADLs?: Yes (appropriate for developmental age) Does the patient have difficulty walking or climbing stairs?: No Weakness of Legs: None Weakness of Arms/Hands: None  Permission Sought/Granted Permission sought to share information with : Facility Sport and exercise psychologist, Family Supports    Share Information with NAME: Shaine Mount  Permission granted to share info w AGENCY: [place] granted to share info w Relationship: Husband  Permission granted to share info w Contact Information: [phone]  Emotional Assessment Appearance:: Appears stated age Attitude/Demeanor/Rapport: Engaged Affect (typically observed): Anxious, Apprehensive Orientation: : Oriented to Self, Oriented to Place, Oriented to  Time, Oriented to Situation Alcohol / Substance Use: Not Applicable Psych Involvement:  No (comment)  Admission diagnosis:  Diverticulitis [K57.92] Left sided abdominal pain [R10.9] Acute diverticulitis [K57.92] Patient Active Problem List   Diagnosis Date Noted  . Acute  diverticulitis 07/17/2019  . Diverticulitis of intestine with abscess without bleeding 06/30/2019  . Chronic eczema 08/30/2015  . Health care maintenance 03/01/2015  . Simple chronic bronchitis ([place]) 03/01/2015  . Shortness of breath 12/16/2014  . Hyperlipidemia, mixed 06/04/2014  . Hypercholesterolemia 02/26/2014  . PVC (premature ventricular contraction) 09/04/2013  . Tobacco abuse 09/04/2013   PCP:  Kirk Ruths, MD Pharmacy:   [id number] [id number]: [phone]: [phone]    Social Determinants of Health (SDOH) Interventions    Readmission Risk Interventions No flowsheet data found.

## 2019-07-23 ENCOUNTER — Ambulatory Visit: Payer: Medicare Other | Admitting: Physician Assistant

## 2019-07-23 MED ORDER — ONDANSETRON 4 MG PO TBDP: 4.0000 mg | ORAL_TABLET | Freq: Four times a day (QID) | ORAL | 0 refills | Status: DC | PRN

## 2019-07-23 MED ORDER — MEROPENEM IV (FOR PTA / DISCHARGE USE ONLY)
1.0000 g | Freq: Three times a day (TID) | INTRAVENOUS | 0 refills | Status: AC
Start: 2019-07-23 — End: 2019-07-30

## 2019-07-23 NOTE — Discharge Summary (Addendum)
[facility] SURGICAL DISCHARGE SUMMARY (cpt: [phone])  Patient ID: Brianna Briggs MRN: [id number] DOB/AGE: 20-Mar-1950 69 y.o.  Admit date: 07/17/2019 Discharge date: 07/23/2019  Discharge Diagnoses Patient Active Problem List   Diagnosis Date Noted  . Acute diverticulitis 07/17/2019  . Diverticulitis of intestine with abscess without bleeding 06/30/2019   Consultants Infectious Disease  Procedures None  HPI: 69 y.o. female who is well known to our service following recent admission for complicated diverticulitis s/p drain placement on 06/15 who has been following up outpatient for this.Unfortunately, her drainage has remained purulent and the drain has remained in place since last admission. She presented to [facility] ED overnight for worsening abdominal pain. Patient reports that in the last few days she has had worsening left sided abdominal and flank pain. The pain is in her LLQ and radiates through to her flanks. This was described as a sharp and stabbing pain. This is severe, constant, and rated 9/10. No fever, chills, CP, SOB, emesis, urinary changes, or bowel changes. She does endorse some nausea but this has been a chronic complaint for some time. This presentation was similar to her prior for diverticulitis. She and her husband have been maintaining her drain at home but she feels this has started to leak over the last week or so. Again, only previous abdominal surgery is abdominal hysterectomy. Last colonoscopy was in April of this year with [facility] gastroenterology. Work up in the ED was concerning for mild leukocytosis with WBC 11.8K, Hgb was 15.9 and likely hemoconcentration, and CT Abdomen/Pelvis was concerning for a new area of uncomplicated diverticulitis in the descending colon.   Hospital Course: Patient was admitted for conservative management of this. Her leukocytosis and pain resolved within the first few hospital days. Her previously placed drain was removed on  07/05 after follow up imaging showed improvement and resolution of her previous abscess. We did ask infectious disease to be involved given her allergy history and ABx intolerances and they provided recommendations. The remainder of patient's hospital course was essentially unremarkable, and discharge planning was initiated accordingly with patient safely able to be discharged home with appropriate discharge instructions, antibiotics (meropenem IV x7 days), pain control, and outpatient follow-up after all of her questions were answered to herexpressed satisfaction.   Discharge Condition: Good    Physical Examination:  Constitutional: alert, cooperative and no distress  HENT: normocephalic without obvious abnormality  Eyes: PERRL, EOM's grossly intact and symmetric  Respiratory: breathing non-labored at rest  Cardiovascular: regular rate and sinus rhythm  Gastrointestinal:Soft,very mild LLQ soreness, and non-distended. No rebound/guarding Musculoskeletal: no edema or wounds, motor and sensation grossly intact, NT    Allergies as of 07/23/2019      Reactions   Amoxicillin-pot Clavulanate Diarrhea, Rash   Other reaction(s): Unknown   Biaxin [clarithromycin] Diarrhea   Other reaction(s): Unknown   Oxycodone Itching   Codeine Rash   Nitrofurantoin Rash      Medication List    TAKE these medications   fluticasone 50 MCG/ACT nasal spray Commonly known as: FLONASE Place 2 sprays into both nostrils daily as needed.   meropenem  IVPB Commonly known as: MERREM Inject 1 g into the vein every 8 (eight) hours for 7 days. Indication:  Acute Diverticulitis First Dose: No Last Day of Therapy:  07/28/2019 Labs - Once weekly:  CBC/D and BMP, Labs - Every other week:  ESR and CRP Method of administration: Mini-Bag Plus / Gravity Method of administration may be changed at the discretion of  home infusion pharmacist based upon assessment of the patient and/or caregiver's ability to self-administer  the medication ordered.   omeprazole 20 MG capsule Commonly known as: PRILOSEC Take 1 capsule (20 mg total) by mouth 2 (two) times daily before a meal. What changed:   when to take this  reasons to take this   ondansetron 4 MG disintegrating tablet Commonly known as: ZOFRAN-ODT Take 1 tablet (4 mg total) by mouth every 6 (six) hours as needed for nausea.            Discharge Care Instructions  (From admission, onward)         Start     Ordered   07/23/19 0000  Change dressing on IV access line weekly and PRN  (Home infusion instructions - [facility] )        07/23/19 1151            Follow-up Information    [facility] Follow [facility].   Specialty: Home Health Services Why: They will follow up with you for your home health nursing needs. Start of care will be Thursday July 8th. Contact information: [address] [place] [address] [phone]        Ronny Bacon, MD. Schedule an appointment as soon as possible for a visit in 2 week(s).   Specialty: General Surgery Why: follow up hospitalization for diverticulitis  Contact information: [address] [place] [address] [phone]                Time spent on discharge management including discussion of hospital course, clinical condition, outpatient instructions, prescriptions, and follow up with the patient and members of the medical team: >30 minutes  -- Edison Simon , PA-C [facility]  07/23/2019, 1:29 PM [phone] M-F: 7am - 4pm

## 2019-07-23 NOTE — TOC Transition Note (Signed)
Transition of Care [facility]) - CM/SW Discharge Note   Patient Details  Name: Brianna Briggs MRN: [id number] Date of Birth: [birth year]  [facility]) CM/SW Contact:  Candie Chroman, LCSW Phone Number: 07/23/2019, 1:18 PM   Clinical Narrative:  Patient has orders to discharge home today. [facility] and [facility] representatives are aware. No further concerns. CSW signing off.   Final next level of care: Home w Home Health Services Barriers to Discharge: Barriers Resolved   Patient Goals and CMS Choice   CMS Medicare.gov Compare Post Acute Care list provided to:: Patient Choice offered to / list presented to : Patient, Spouse  Discharge Placement                    Patient and family notified of of transfer: 07/23/19  Discharge Plan and Services     Post Acute Care Choice: [place] Arranged: RN, IV Antibiotics [place] Agency: [facility] [place], Other - See comment ([facility]) Date [facility] Agency Contacted: 07/23/19   Representative spoke with at [place]: Jana Half: Jackquline Denmark, Carolynn Sayers: [facility]  Social Determinants of Health (SDOH) Interventions     Readmission Risk Interventions No flowsheet data found.

## 2019-07-23 NOTE — Progress Notes (Deleted)
[facility] SURGICAL PROGRESS NOTE (cpt [phone])  Hospital Day(s): 6.   Interval History: Patient seen and examined, no acute events or new complaints overnight. Patient reports she is feeling better, denies abdominal pain, nnausea, emesis, fever. No new labs or imaging this morning. She had midline catheter placed for IV Abx at home. Home health agency can begin care on Thursday (07/08). No new complaints.   Review of Systems:  Constitutional: denies fever, chills  HEENT: denies cough or congestion  Respiratory: denies any shortness of breath  Cardiovascular: denies chest pain or palpitations  Gastrointestinal: + abdominal pain (mild, improved), denied N/V, or diarrhea/and bowel function as per interval history Genitourinary: denies burning with urination or urinary frequency  Vital signs in last 24 hours: [min-max] current  Temp:  [97.9 F (36.6 C)-98 F (36.7 C)] 98 F (36.7 C) (07/07 0423) Pulse Rate:  [61-63] 61 (07/07 0423) Resp:  [16-20] 20 (07/07 0423) BP: (130-139)/(77-84) 130/77 (07/07 0423) SpO2:  [94 %-97 %] 94 % (07/07 0423)     Height: 5\' 2"  (157.5 cm) Weight: 60.8 kg BMI (Calculated): 24.5   Intake/Output last 2 shifts:  07/06 0701 - 07/07 0700 In: 845 [P.O.:840; I.V.:5] Out: 1200 [Urine:1200]   Physical Exam:  Constitutional: alert, cooperative and no distress  HENT: normocephalic without obvious abnormality  Eyes: PERRL, EOM's grossly intact and symmetric  Respiratory: breathing non-labored at rest  Cardiovascular: regular rate and sinus rhythm  Gastrointestinal: Soft, very mild LLQ soreness, and non-distended. No rebound/guarding Musculoskeletal: no edema or wounds, motor and sensation grossly intact, NT    Labs:  CBC Latest Ref Rng & Units 07/21/2019 07/20/2019 07/18/2019  WBC 4.0 - 10.5 K/uL 5.9 5.7 6.6  Hemoglobin 12.0 - 15.0 g/dL 14.4 12.9 12.8  Hematocrit 36 - 46 % 42.3 38.0 39.7  Platelets 150 - 400 K/uL 229 230 217   CMP Latest Ref Rng  & Units 07/22/2019 07/20/2019 07/18/2019  Glucose 70 - 99 mg/dL 92 88 81  BUN 8 - 23 mg/dL 6(L) <5(L) 9  Creatinine 0.44 - 1.00 mg/dL 0.75 0.78 0.80  Sodium 135 - 145 mmol/L 139 141 139  Potassium 3.5 - 5.1 mmol/L 3.4(L) 3.5 3.7  Chloride 98 - 111 mmol/L 98 101 102  CO2 22 - 32 mmol/L 32 33(H) 30  Calcium 8.9 - 10.3 mg/dL 8.4(L) 8.3(L) 8.6(L)  Total Protein 6.5 - 8.1 g/dL - 5.7(L) -  Total Bilirubin 0.3 - 1.2 mg/dL - 0.4 -  Alkaline Phos 38 - 126 U/L - 53 -  AST 15 - 41 U/L - 26 -  ALT 0 - 44 U/L - 14 -    Imaging studies: No new pertinent imaging studies   Assessment/Plan: (ICD-10's: K5.92) 69 y.o. female with clinically improved episode of descending colon diverticulitis with improvement in previously seen complicated diverticulitis of the sigmoid colon with abscess s/p percutaneous drainage on 06/15 (removed 07/05)    - Continue diet   - Continue Meropenem; appreciate ID assistance with this patient               - Pain control prn; antiemetics prn             - monitor abdominal examination             - No plans for surgical intervention this hospitalization; she will benefit from elective sigmoid colectomy as outpatient              - medical management of comorbid conditions             -  mobilize - DVT prophylaxis   - Discharge Planning: Home Health can not begin care until Thursday, as such we will keep her today and discharge first thing in the morning to ensure she will not miss any doses of her IV Abx  All of the above findings and recommendations were discussed with the patient, and the medical team, and all of patient's questions were answered to her expressed satisfaction.  -- Edison Simon, PA-C [facility] 07/23/2019, 7:43 AM [phone] M-F: 7am - 4pm

## 2019-07-28 ENCOUNTER — Encounter: Payer: BLUE CROSS/BLUE SHIELD | Admitting: Dermatology

## 2019-07-31 ENCOUNTER — Telehealth: Payer: Self-pay

## 2019-07-31 NOTE — Telephone Encounter (Signed)
Contacted patient to schedule annual lung screening CT scan.  Patient's last scan was 07/2018.  Message left for patient to contact Burgess Estelle, Lung navigator to schedule appointment for scan.

## 2019-08-05 ENCOUNTER — Encounter: Payer: Self-pay | Admitting: Surgery

## 2019-08-05 ENCOUNTER — Ambulatory Visit (INDEPENDENT_AMBULATORY_CARE_PROVIDER_SITE_OTHER): Payer: Medicare Other | Admitting: Surgery

## 2019-08-05 ENCOUNTER — Other Ambulatory Visit: Payer: Self-pay

## 2019-08-05 VITALS — BP 136/87 | HR 108 | Temp 97.7°F | Ht 62.0 in | Wt 131.2 lb

## 2019-08-05 DIAGNOSIS — K5720 Diverticulitis of large intestine with perforation and abscess without bleeding: Secondary | ICD-10-CM | POA: Diagnosis not present

## 2019-08-05 NOTE — Patient Instructions (Addendum)
Dr.Rodenberg recommends patient to follow up with our office in one month.   Diverticulitis  Diverticulitis is infection or inflammation of small pouches (diverticula) in the colon that form due to a condition called diverticulosis. Diverticula can trap stool (feces) and bacteria, causing infection and inflammation. Diverticulitis may cause severe stomach pain and diarrhea. It may lead to tissue damage in the colon that causes bleeding. The diverticula may also burst (rupture) and cause infected stool to enter other areas of the abdomen. Complications of diverticulitis can include:  Bleeding.  Severe infection.  Severe pain.  Rupture (perforation) of the colon.  Blockage (obstruction) of the colon. What are the causes? This condition is caused by stool becoming trapped in the diverticula, which allows bacteria to grow in the diverticula. This leads to inflammation and infection. What increases the risk? You are more likely to develop this condition if:  You have diverticulosis. The risk for diverticulosis increases if: ? You are overweight or obese. ? You use tobacco products. ? You do not get enough exercise.  You eat a diet that does not include enough fiber. High-fiber foods include fruits, vegetables, beans, nuts, and whole grains. What are the signs or symptoms? Symptoms of this condition may include:  Pain and tenderness in the abdomen. The pain is normally located on the left side of the abdomen, but it may occur in other areas.  Fever and chills.  Bloating.  Cramping.  Nausea.  Vomiting.  Changes in bowel routines.  Blood in your stool. How is this diagnosed? This condition is diagnosed based on:  Your medical history.  A physical exam.  Tests to make sure there is nothing else causing your condition. These tests may include: ? Blood tests. ? Urine tests. ? Imaging tests of the abdomen, including X-rays, ultrasounds, MRIs, or CT scans. How is this  treated? Most cases of this condition are mild and can be treated at home. Treatment may include:  Taking over-the-counter pain medicines.  Following a clear liquid diet.  Taking antibiotic medicines by mouth.  Rest. More severe cases may need to be treated at a hospital. Treatment may include:  Not eating or drinking.  Taking prescription pain medicine.  Receiving antibiotic medicines through an IV tube.  Receiving fluids and nutrition through an IV tube.  Surgery. When your condition is under control, your health care provider may recommend that you have a colonoscopy. This is an exam to look at the entire large intestine. During the exam, a lubricated, bendable tube is inserted into the anus and then passed into the rectum, colon, and other parts of the large intestine. A colonoscopy can show how severe your diverticula are and whether something else may be causing your symptoms. Follow these instructions at home: Medicines  Take over-the-counter and prescription medicines only as told by your health care provider. These include fiber supplements, probiotics, and stool softeners.  If you were prescribed an antibiotic medicine, take it as told by your health care provider. Do not stop taking the antibiotic even if you start to feel better.  Do not drive or use heavy machinery while taking prescription pain medicine. General instructions   Follow a full liquid diet or another diet as directed by your health care provider. After your symptoms improve, your health care provider may tell you to change your diet. He or she may recommend that you eat a diet that contains at least 25 g (25 grams) of fiber daily. Fiber makes it easier to pass  stool. Healthy sources of fiber include: ? Berries. One cup contains 4-8 grams of fiber. ? Beans or lentils. One half cup contains 5-8 grams of fiber. ? Green vegetables. One cup contains 4 grams of fiber.  Exercise for at least 30 minutes, 3  times each week. You should exercise hard enough to raise your heart rate and break a sweat.  Keep all follow-up visits as told by your health care provider. This is important. You may need a colonoscopy. Contact a health care provider if:  Your pain does not improve.  You have a hard time drinking or eating food.  Your bowel movements do not return to normal. Get help right away if:  Your pain gets worse.  Your symptoms do not get better with treatment.  Your symptoms suddenly get worse.  You have a fever.  You vomit more than one time.  You have stools that are bloody, black, or tarry. Summary  Diverticulitis is infection or inflammation of small pouches (diverticula) in the colon that form due to a condition called diverticulosis. Diverticula can trap stool (feces) and bacteria, causing infection and inflammation.  You are at higher risk for this condition if you have diverticulosis and you eat a diet that does not include enough fiber.  Most cases of this condition are mild and can be treated at home. More severe cases may need to be treated at a hospital.  When your condition is under control, your health care provider may recommend that you have an exam called a colonoscopy. This exam can show how severe your diverticula are and whether something else may be causing your symptoms. This information is not intended to replace advice given to you by your health care provider. Make sure you discuss any questions you have with your health care provider. Document Revised: 12/15/2016 Document Reviewed: 02/05/2016 Elsevier Patient Education  2020 [employer].

## 2019-08-05 NOTE — Progress Notes (Signed)
Surgical Clinic Progress/Follow-up Note   HPI:  69 y.o. Female presents to clinic for post diverticular abscess and recurrent diverticulitis follow-up.  She has completed her home IV antibiotics.  Her stools are beginning to resume their form and become less frequent.  She reports some lingering sporadic suprapubic and left lower quadrant discomfort, but nothing persistent or progressive.  Patient reports  improvement/resolution of prior issues and has been tolerating regular diet with +flatus and normal BM's, denies N/V, fever/chills, CP, or SOB. She admits to resuming smoking.  Review of Systems:  Constitutional: denies fever/chills  ENT: denies sore throat, hearing problems  Respiratory: denies shortness of breath, wheezing  Cardiovascular: denies chest pain, palpitations  Gastrointestinal: as per interval history Skin: She continues to struggle with the eczema involving her dorsal proximal forearms, utilizing steroid creams and is currently hoping to qualify/obtain some newer medication.  Vital Signs:  BP 136/87   Pulse (!) 108   Temp 97.7 F (36.5 C) (Oral)   Ht 5\' 2"  (1.575 m)   Wt 131 lb 3.2 oz (59.5 kg)   SpO2 94%   BMI 24.00 kg/m    Physical Exam:  Constitutional:  -- Normal body habitus  -- Awake, alert, and oriented x3  Pulmonary:  -- No crackles -- Equal breath sounds bilaterally -- Breathing non-labored at rest Cardiovascular:  -- S1, S2 present  -- No pericardial rubs  Gastrointestinal:  -- Soft and non-distended, non-tender/with no remarkable involuntary tenderness to palpation, no guarding/rebound tenderness -- No abdominal masses appreciated, pulsatile or otherwise  -- Extremities: B/L UE and LE FROM, hands and feet warm, no edema   Imaging: No new pertinent imaging available for review   Assessment:  69 y.o. yo Female with a problem list including...  Patient Active Problem List   Diagnosis Date Noted  . Acute diverticulitis 07/17/2019  .  Diverticulitis of intestine with abscess without bleeding 06/30/2019  . Chronic eczema 08/30/2015  . Health care maintenance 03/01/2015  . Simple chronic bronchitis ([place]) 03/01/2015  . Shortness of breath 12/16/2014  . Hyperlipidemia, mixed 06/04/2014  . Hypercholesterolemia 02/26/2014  . PVC (premature ventricular contraction) 09/04/2013  . Tobacco abuse 09/04/2013    presents to clinic for follow-up evaluation of diverticular disease complicated by prior abscess, progressing well.  Plan:              - return to clinic in 1 month or as needed, instructed to call office if any questions or concerns  All of the above recommendations were discussed with the patient, and all of patient's questions were answered to her expressed satisfaction.  Ronny Bacon, MD, FACS [place]: [place] for exceptional care. Office: [phone]

## 2019-08-08 ENCOUNTER — Telehealth: Payer: Self-pay | Admitting: *Deleted

## 2019-08-08 NOTE — Telephone Encounter (Signed)
(  08/08/2019) Left message for pt to notify them that it is time to schedule annual low dose lung cancer screening CT scan. Instructed patient to call back to verify information prior to the scan being scheduled °SRW °  ° ° °

## 2019-09-04 ENCOUNTER — Ambulatory Visit: Payer: Medicare Other | Admitting: Surgery

## 2019-09-09 ENCOUNTER — Encounter: Payer: Self-pay | Admitting: Surgery

## 2019-09-09 ENCOUNTER — Other Ambulatory Visit: Payer: Self-pay

## 2019-09-09 ENCOUNTER — Ambulatory Visit (INDEPENDENT_AMBULATORY_CARE_PROVIDER_SITE_OTHER): Payer: Medicare Other | Admitting: Surgery

## 2019-09-09 VITALS — Temp 97.9°F | Ht 62.0 in | Wt 131.4 lb

## 2019-09-09 DIAGNOSIS — K5720 Diverticulitis of large intestine with perforation and abscess without bleeding: Secondary | ICD-10-CM | POA: Diagnosis not present

## 2019-09-09 NOTE — Patient Instructions (Addendum)
Dr.Rodenberg suggested patient to try increase her Fiber intake to help with regulating bowel movements. Patient may try over the counter Metamucil or Benefiber or Fiber supplements. Follow-up with our office as needed. Please call and ask to speak with a nurse if you develop questions or concerns.  High-Fiber Diet Fiber, also called dietary fiber, is a type of carbohydrate that is found in fruits, vegetables, whole grains, and beans. A high-fiber diet can have many health benefits. Your health care provider may recommend a high-fiber diet to help:  Prevent constipation. Fiber can make your bowel movements more regular.  Lower your cholesterol.  Relieve the following conditions: ? Swelling of veins in the anus (hemorrhoids). ? Swelling and irritation (inflammation) of specific areas of the digestive tract (uncomplicated diverticulosis). ? A problem of the large intestine (colon) that sometimes causes pain and diarrhea (irritable bowel syndrome, IBS).  Prevent overeating as part of a weight-loss plan.  Prevent heart disease, type 2 diabetes, and certain cancers. What is my plan? The recommended daily fiber intake in grams (g) includes:  38 g for men age 70 or younger.  30 g for men over age 70.  36 g for women age 70 or younger.  21 g for women over age 70. You can get the recommended daily intake of dietary fiber by:  Eating a variety of fruits, vegetables, grains, and beans.  Taking a fiber supplement, if it is not possible to get enough fiber through your diet. What do I need to know about a high-fiber diet?  It is better to get fiber through food sources rather than from fiber supplements. There is not a lot of research about how effective supplements are.  Always check the fiber content on the nutrition facts label of any prepackaged food. Look for foods that contain 5 g of fiber or more per serving.  Talk with a diet and nutrition specialist (dietitian) if you have  questions about specific foods that are recommended or not recommended for your medical condition, especially if those foods are not listed below.  Gradually increase how much fiber you consume. If you increase your intake of dietary fiber too quickly, you may have bloating, cramping, or gas.  Drink plenty of water. Water helps you to digest fiber. What are tips for following this plan?  Eat a wide variety of high-fiber foods.  Make sure that half of the grains that you eat each day are whole grains.  Eat breads and cereals that are made with whole-grain flour instead of refined flour or white flour.  Eat brown rice, bulgur wheat, or millet instead of white rice.  Start the day with a breakfast that is high in fiber, such as a cereal that contains 5 g of fiber or more per serving.  Use beans in place of meat in soups, salads, and pasta dishes.  Eat high-fiber snacks, such as berries, raw vegetables, nuts, and popcorn.  Choose whole fruits and vegetables instead of processed forms like juice or sauce. What foods can I eat?  Fruits Berries. Pears. Apples. Oranges. Avocado. Prunes and raisins. Dried figs. Vegetables Sweet potatoes. Spinach. Kale. Artichokes. Cabbage. Broccoli. Cauliflower. Green peas. Carrots. Squash. Grains Whole-grain breads. Multigrain cereal. Oats and oatmeal. Brown rice. Barley. Bulgur wheat. [place]. Quinoa. Bran muffins. Popcorn. Rye wafer crackers. Meats and other proteins Navy, kidney, and pinto beans. Soybeans. Split peas. Lentils. Nuts and seeds. Dairy Fiber-fortified yogurt. Beverages Fiber-fortified soy milk. Fiber-fortified orange juice. Other foods Fiber bars. The items listed  above may not be a complete list of recommended foods and beverages. Contact a dietitian for more options. What foods are not recommended? Fruits Fruit juice. Cooked, strained fruit. Vegetables Fried potatoes. Canned vegetables. Well-cooked vegetables. Grains White bread.  Pasta made with refined flour. White rice. Meats and other proteins Fatty cuts of meat. Fried chicken or fried fish. Dairy Milk. Yogurt. Cream cheese. Sour cream. Fats and oils Butters. Beverages Soft drinks. Other foods Cakes and pastries. The items listed above may not be a complete list of foods and beverages to avoid. Contact a dietitian for more information. Summary  Fiber is a type of carbohydrate. It is found in fruits, vegetables, whole grains, and beans.  There are many health benefits of eating a high-fiber diet, such as preventing constipation, lowering blood cholesterol, helping with weight loss, and reducing your risk of heart disease, diabetes, and certain cancers.  Gradually increase your intake of fiber. Increasing too fast can result in cramping, bloating, and gas. Drink plenty of water while you increase your fiber.  The best sources of fiber include whole fruits and vegetables, whole grains, nuts, seeds, and beans. This information is not intended to replace advice given to you by your health care provider. Make sure you discuss any questions you have with your health care provider. Document Revised: 11/06/2016 Document Reviewed: 11/06/2016 Elsevier Patient Education  2020 [employer].

## 2019-09-09 NOTE — Progress Notes (Signed)
Surgical Clinic Progress/Follow-up Note   HPI:  69 y.o. Female presents to clinic for complicated diverticulitis follow-up 6 weeks follow the last CT evaluation. Patient reports improvement/resolution of prior issues and has been tolerating regular diet with +flatus and progressively normal BM's, denies N/V, fever/chills, CP, or SOB. She has not initiated fiber supplementation.  She reports daily or twice daily bowel movements.  Of recent she has had some better formed stools.  She denies any form of left lower quadrant or pelvic pain.  Review of Systems:  Constitutional: denies fever/chills  ENT: denies sore throat, hearing problems  Respiratory: denies shortness of breath, wheezing  Cardiovascular: denies chest pain, palpitations  Gastrointestinal: denies abdominal pain, N/V, or diarrhea/and bowel function as per interval history Skin: Her psoriasis/eczema continues to be an issue, and she is awaiting follow-up with her dermatologist in October.  She has been utilizing a triamcinolone cream to temporize.  She has been approved for a systemic medication, but she has not taken it yet and she seems somewhat reluctant to.  Vital Signs:  Temp 97.9 F (36.6 C) (Oral)    Ht 5\' 2"  (1.575 m)    Wt 131 lb 6.4 oz (59.6 kg)    BMI 24.03 kg/m    Physical Exam:  Constitutional:  -- Normal body habitus  -- Awake, alert, and oriented x3  Pulmonary:  -- No crackles -- Equal breath sounds bilaterally -- Breathing non-labored at rest Cardiovascular:  -- S1, S2 present  -- No pericardial rubs  Gastrointestinal:  -- Soft and non-distended, non-tender/with no tenderness to palpation, no guarding/rebound tenderness Musculoskeletal / Integumentary:  -- Wounds: None appreciated, with exception of known chronic inflamed areas of her elbows/forearms and other areas. -- Extremities: B/L UE and LE FROM, hands and feet warm, no edema   Laboratory studies: No new labs  Imaging: No new pertinent imaging  available for review   Assessment:  69 y.o. yo Female with a problem list including...  Patient Active Problem List   Diagnosis Date Noted   Diverticulitis of large intestine with abscess without bleeding 07/17/2019   Chronic eczema 08/30/2015   Health care maintenance 03/01/2015   Simple chronic bronchitis ([place]) 03/01/2015   Shortness of breath 12/16/2014   Hyperlipidemia, mixed 06/04/2014   Hypercholesterolemia 02/26/2014   PVC (premature ventricular contraction) 09/04/2013   Tobacco abuse 09/04/2013    presents to clinic for follow-up evaluation of complicated diverticulitis, progressing well.  Plan:   We discussed the role of elective sigmoid colectomy and treatment of her diverticulitis, she is quite reluctant to pursue surgery, consider Covid vaccination or even take a systemic medication for her chronic eczema.  As for now she seems to be maintaining quite well and with the addition of fiber supplementation anticipate further improvement.             - return to clinic as needed, instructed to call office if any questions or concerns  All of the above recommendations were discussed with the patient.  Ronny Bacon, MD, FACS [place]: [place] for exceptional care. Office: [phone]

## 2019-11-10 ENCOUNTER — Ambulatory Visit (INDEPENDENT_AMBULATORY_CARE_PROVIDER_SITE_OTHER): Payer: Medicare Other | Admitting: Dermatology

## 2019-11-10 ENCOUNTER — Other Ambulatory Visit: Payer: Self-pay

## 2019-11-10 DIAGNOSIS — L720 Epidermal cyst: Secondary | ICD-10-CM

## 2019-11-10 DIAGNOSIS — L650 Telogen effluvium: Secondary | ICD-10-CM | POA: Diagnosis not present

## 2019-11-10 DIAGNOSIS — D1800 Hemangioma unspecified site: Secondary | ICD-10-CM

## 2019-11-10 DIAGNOSIS — L2089 Other atopic dermatitis: Secondary | ICD-10-CM

## 2019-11-10 DIAGNOSIS — L821 Other seborrheic keratosis: Secondary | ICD-10-CM

## 2019-11-10 DIAGNOSIS — L820 Inflamed seborrheic keratosis: Secondary | ICD-10-CM

## 2019-11-10 DIAGNOSIS — L814 Other melanin hyperpigmentation: Secondary | ICD-10-CM

## 2019-11-10 DIAGNOSIS — Z1283 Encounter for screening for malignant neoplasm of skin: Secondary | ICD-10-CM | POA: Diagnosis not present

## 2019-11-10 DIAGNOSIS — Z85828 Personal history of other malignant neoplasm of skin: Secondary | ICD-10-CM

## 2019-11-10 DIAGNOSIS — L578 Other skin changes due to chronic exposure to nonionizing radiation: Secondary | ICD-10-CM

## 2019-11-10 DIAGNOSIS — D229 Melanocytic nevi, unspecified: Secondary | ICD-10-CM

## 2019-11-10 MED ORDER — TACROLIMUS 0.1 % EX OINT: TOPICAL_OINTMENT | CUTANEOUS | 3 refills | Status: DC

## 2019-11-10 MED ORDER — MOMETASONE FUROATE 0.1 % EX CREA: 1.0000 "application " | TOPICAL_CREAM | CUTANEOUS | 3 refills | Status: DC

## 2019-11-10 NOTE — Progress Notes (Signed)
Follow-Up Visit   Subjective  Brianna Briggs is a 69 y.o. female who presents for the following: Annual Exam (History of SCC - TBSE) and Other (Hairloss - worse in the last month. Was in the hospital twice over the summer for diverticulitis). The patient presents for Total-Body Skin Exam (TBSE) for skin cancer screening and mole check.  The following portions of the chart were reviewed this encounter and updated as appropriate:  Tobacco  Allergies  Meds  Problems  Med Hx  Surg Hx  Fam Hx     Review of Systems:  No other skin or systemic complaints except as noted in HPI or Assessment and Plan.  Objective  Well appearing patient in no apparent distress; mood and affect are within normal limits.  A full examination was performed including scalp, head, eyes, ears, nose, lips, neck, chest, axillae, abdomen, back, buttocks, bilateral upper extremities, bilateral lower extremities, hands, feet, fingers, toes, fingernails, and toenails. All findings within normal limits unless otherwise noted below.  Objective  Scalp: Diffuse thinning of hair, positive hair pull test.   Medical records and labs reviewed.  Objective  Arms/hands, legs/feet: Pinkness and scale  BSA is 50%  Objective  Right mid back: 2.0 cm cystic papule  Objective  Scalp (7): Erythematous keratotic or waxy stuck-on papule or plaque.    Assessment & Plan    Lentigines - Scattered tan macules - Discussed due to sun exposure - Benign, observe - Call for any changes  Seborrheic Keratoses - Stuck-on, waxy, tan-brown papules and plaques  - Discussed benign etiology and prognosis. - Observe - Call for any changes  Melanocytic Nevi - Tan-brown and/or pink-flesh-colored symmetric macules and papules - Benign appearing on exam today - Observation - Call clinic for new or changing moles - Recommend daily use of broad spectrum spf 30+ sunscreen to sun-exposed areas.   Hemangiomas - Red papules -  Discussed benign nature - Observe - Call for any changes  Actinic Damage - diffuse scaly erythematous macules with underlying dyspigmentation - Recommend daily broad spectrum sunscreen SPF 30+ to sun-exposed areas, reapply every 2 hours as needed.  - Call for new or changing lesions.  Skin cancer screening performed today.  History of Squamous Cell Carcinoma of the Skin - No evidence of recurrence today - No lymphadenopathy - Recommend regular full body skin exams - Recommend daily broad spectrum sunscreen SPF 30+ to sun-exposed areas, reapply every 2 hours as needed.  - Call if any new or changing lesions are noted between office visits  Telogen effluvium Scalp Likely secondary to diverticulitis and recent hospitalization for diverticulitis.   Discussed: Emotional stress as well as thyroid disease and multiple other things can cause this hair loss.   Informational article given.  Advised patient condition usually corrects itself over time. Recommend 2.5 mg biotin daily. Other helpful things are Rogaine twice daily;   red light treatments such as HairMax or Revian  article given today.  She has an appointment coming up with Dr. Frazier Richards and we will request that he add a thyroid panel (or at least TSH)  to her lab work.  Atopic dermatitis vs Arms/hands, legs/feet Atopic Dermatitis with psoriasis overlap - Mometasone cream Monday, Wednesday, Friday Protopic ointment Tuesday, Thursday, Saturday, Sunday  Patient's condition is quite severe and we had offered her Dupixent injections in the past.  She would likely do very well with this but she declined this in the past and declines it again today.  mometasone (  ELOCON) 0.1 % cream - Arms/hands, legs/feet  tacrolimus (PROTOPIC) 0.1 % ointment - Arms/hands, legs/feet  Epidermal inclusion cyst Right mid back May consider excision in the future.  Inflamed seborrheic keratosis (7) Scalp Destruction of lesion -  Scalp Complexity: simple   Destruction method: cryotherapy   Informed consent: discussed and consent obtained   Timeout:  patient name, date of birth, surgical site, and procedure verified Lesion destroyed using liquid nitrogen: Yes   Region frozen until ice ball extended beyond lesion: Yes   Outcome: patient tolerated procedure well with no complications   Post-procedure details: wound care instructions given    Return in about 3 months (around 02/10/2020).   I, Ashok Cordia, CMA, am acting as scribe for Sarina Ser, MD .  Documentation: I have reviewed the above documentation for accuracy and completeness, and I agree with the above.  Sarina Ser, MD

## 2019-11-10 NOTE — Patient Instructions (Signed)

## 2019-11-11 ENCOUNTER — Encounter: Payer: Self-pay | Admitting: Dermatology

## 2019-12-03 ENCOUNTER — Telehealth: Payer: Self-pay | Admitting: *Deleted

## 2019-12-03 ENCOUNTER — Encounter: Payer: Self-pay | Admitting: *Deleted

## 2019-12-03 DIAGNOSIS — Z87891 Personal history of nicotine dependence: Secondary | ICD-10-CM

## 2019-12-03 DIAGNOSIS — Z122 Encounter for screening for malignant neoplasm of respiratory organs: Secondary | ICD-10-CM

## 2019-12-03 NOTE — Telephone Encounter (Signed)
Attempted to contact and schedule lung screening scan. Message left for patient to call back to schedule. 

## 2019-12-05 NOTE — Addendum Note (Signed)
Addended by: Lieutenant Diego on: 12/05/2019 10:31 AM   Modules accepted: Orders

## 2019-12-05 NOTE — Telephone Encounter (Signed)
Contacted and scheduled for lung screening scan. Patient is a current smoker with a 61.5 pack year history.

## 2019-12-10 ENCOUNTER — Ambulatory Visit
Admission: RE | Admit: 2019-12-10 | Discharge: 2019-12-10 | Disposition: A | Discharge status: Home / Self Care | Payer: Medicare Other | Source: Ambulatory Visit | Attending: Nurse Practitioner | Admitting: Nurse Practitioner

## 2019-12-10 ENCOUNTER — Other Ambulatory Visit: Payer: Self-pay

## 2019-12-10 DIAGNOSIS — Z87891 Personal history of nicotine dependence: Secondary | ICD-10-CM

## 2019-12-10 DIAGNOSIS — Z122 Encounter for screening for malignant neoplasm of respiratory organs: Secondary | ICD-10-CM | POA: Insufficient documentation

## 2019-12-17 ENCOUNTER — Encounter: Payer: Self-pay | Admitting: *Deleted

## 2020-02-17 ENCOUNTER — Ambulatory Visit: Payer: Medicare Other | Admitting: Dermatology

## 2020-02-24 ENCOUNTER — Ambulatory Visit (INDEPENDENT_AMBULATORY_CARE_PROVIDER_SITE_OTHER): Payer: Medicare Other | Admitting: Dermatology

## 2020-02-24 ENCOUNTER — Other Ambulatory Visit: Payer: Self-pay

## 2020-02-24 DIAGNOSIS — L2089 Other atopic dermatitis: Secondary | ICD-10-CM

## 2020-02-24 DIAGNOSIS — L820 Inflamed seborrheic keratosis: Secondary | ICD-10-CM

## 2020-02-24 DIAGNOSIS — L570 Actinic keratosis: Secondary | ICD-10-CM

## 2020-02-24 DIAGNOSIS — D229 Melanocytic nevi, unspecified: Secondary | ICD-10-CM

## 2020-02-24 DIAGNOSIS — D224 Melanocytic nevi of scalp and neck: Secondary | ICD-10-CM | POA: Diagnosis not present

## 2020-02-24 DIAGNOSIS — H61002 Unspecified perichondritis of left external ear: Secondary | ICD-10-CM

## 2020-02-24 DIAGNOSIS — L409 Psoriasis, unspecified: Secondary | ICD-10-CM

## 2020-02-24 NOTE — Progress Notes (Signed)
Follow-Up Visit   Subjective  Brianna Briggs is a 70 y.o. female who presents for the following: Follow-up (Atopic dermatitis with psoriasis overlap of hands - Mometasone cream 3 times per week, Tacrolimus 3 times per week - stopped when her hands cleared but started to flare in the last 3 days//Inflamed SK follow up of scalp treated with LN2 x 7) and Other (Spot of left ear that feels like a knot and it is very sore and feels crusty sometimes).  The following portions of the chart were reviewed this encounter and updated as appropriate:   Tobacco  Allergies  Meds  Problems  Med Hx  Surg Hx  Fam Hx     Review of Systems:  No other skin or systemic complaints except as noted in HPI or Assessment and Plan.  Objective  Well appearing patient in no apparent distress; mood and affect are within normal limits.  A focused examination was performed including scalp, face, arms, hands. Relevant physical exam findings are noted in the Assessment and Plan.  Objective  Right Hand - Anterior: Pinkness and scale of hyperlinear palms  Objective  Left sup helix: Flesh colored papule  Objective  Scalp x 3, right temple x 3 (6): Erythematous keratotic or waxy stuck-on papule or plaque.   Objective  Scalp: Flesh colored papule  Objective  Right cheek: Erythematous thin papules/macules with gritty scale.    Assessment & Plan  Atopic dermatitis - Severe Right Hand - Anterior Atopic Dermatitis with psoriasis overlap - Continue Mometasone cream Monday, Wednesday, Friday Continue Protopic ointment Tuesday, Thursday, Saturday, Sunday   Patient's condition is quite severe and we had offered her Dupixent injections in the past.  She would likely do very well with this but she declined this in the past and declines it again today.  Atopic dermatitis (eczema) is a chronic, relapsing, pruritic condition that can significantly affect quality of life. It is often associated with allergic  rhinitis and/or asthma and can require treatment with topical medications, phototherapy, or in severe cases a biologic medication called Dupixent in older children and adults.   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 Start Mometasone cream qhs up to 5 times per week Discussed IL injection vs shave removal - may consider if not improving with Mometasone  Inflamed seborrheic keratosis (6) Scalp x 3, right temple x 3  Destruction of lesion - Scalp x 3, right temple x 3 Complexity: simple   Destruction method: cryotherapy   Informed consent: discussed and consent obtained   Timeout:  patient name, date of birth, surgical site, and procedure verified Lesion destroyed using liquid nitrogen: Yes   Region frozen until ice ball extended beyond lesion: Yes   Outcome: patient tolerated procedure well with no complications   Post-procedure details: wound care instructions given    Nevus Scalp Benign-appearing.  Observation.  Call clinic for new or changing lesions.  Recommend daily use of broad spectrum spf 30+ sunscreen to sun-exposed areas.    AK (actinic keratosis) Right cheek Destruction of lesion - Right cheek Complexity: simple   Destruction method: cryotherapy   Informed consent: discussed and consent obtained   Timeout:  patient name, date of birth, surgical site, and procedure verified Lesion destroyed using liquid nitrogen: Yes   Region frozen until ice ball extended beyond lesion: Yes   Outcome: patient tolerated procedure well with no complications   Post-procedure  details: wound care instructions given    Return for Follow up 4-6 months.   I, Ashok Cordia, CMA, am acting as scribe for Sarina Ser, MD .  Documentation: I have reviewed the above documentation for accuracy and completeness, and I agree with the above.  Sarina Ser, MD

## 2020-02-24 NOTE — Patient Instructions (Addendum)
Wound Care Instructions  1. Cleanse wound gently with soap and water once a day then pat dry with clean gauze. Apply a thing coat of Petrolatum (petroleum jelly, "Vaseline") over the wound (unless you have an allergy to this). We recommend that you use a new, sterile tube of Vaseline. Do not pick or remove scabs. Do not remove the yellow or white "healing tissue" from the base of the wound.  2. Cover the wound with fresh, clean, nonstick gauze and secure with paper tape. You may use Band-Aids in place of gauze and tape if the would is small enough, but would recommend trimming much of the tape off as there is often too much. Sometimes Band-Aids can irritate the skin.  3. You should call the office for your biopsy report after 1 week if you have not already been contacted.  4. If you experience any problems, such as abnormal amounts of bleeding, swelling, significant bruising, significant pain, or evidence of infection, please call the office immediately.  5. FOR ADULT SURGERY PATIENTS: If you need something for pain relief you may take 1 extra strength Tylenol (acetaminophen) AND 2 Ibuprofen (200mg  each) together every 4 hours as needed for pain. (do not take these if you are allergic to them or if you have a reason you should not take them.) Typically, you may only need pain medication for 1 to 3 days.    Start Mometasone cream qhs up to 5 times per week to ear  Continue Mometasone cream 3 times per week and Tacrolimus oint 3 times per week to hands

## 2020-02-28 ENCOUNTER — Encounter: Payer: Self-pay | Admitting: Dermatology

## 2020-07-08 ENCOUNTER — Other Ambulatory Visit: Payer: Self-pay

## 2020-07-08 ENCOUNTER — Ambulatory Visit (INDEPENDENT_AMBULATORY_CARE_PROVIDER_SITE_OTHER): Payer: Medicare Other | Admitting: Dermatology

## 2020-07-08 DIAGNOSIS — L820 Inflamed seborrheic keratosis: Secondary | ICD-10-CM | POA: Diagnosis not present

## 2020-07-08 DIAGNOSIS — L409 Psoriasis, unspecified: Secondary | ICD-10-CM

## 2020-07-08 DIAGNOSIS — L2089 Other atopic dermatitis: Secondary | ICD-10-CM | POA: Diagnosis not present

## 2020-07-08 DIAGNOSIS — L720 Epidermal cyst: Secondary | ICD-10-CM

## 2020-07-08 MED ORDER — OPZELURA 1.5 % EX CREA: 1.0000 "application " | TOPICAL_CREAM | Freq: Two times a day (BID) | CUTANEOUS | 3 refills | Status: DC

## 2020-07-08 NOTE — Progress Notes (Signed)
   Follow-Up Visit   Subjective  Brianna Briggs is a 70 y.o. female who presents for the following: Actinic Keratosis (Right cheek treated with LN2) and Follow-up (ISK follow up of scalp, right temple treated with LN2 x 6).  The following portions of the chart were reviewed this encounter and updated as appropriate:   Tobacco  Allergies  Meds  Problems  Med Hx  Surg Hx  Fam Hx      Review of Systems:  No other skin or systemic complaints except as noted in HPI or Assessment and Plan.  Objective  Well appearing patient in no apparent distress; mood and affect are within normal limits.  A focused examination was performed including face, trunk, extremities. Relevant physical exam findings are noted in the Assessment and Plan.  Trunk, extremities, forehead (34) Erythematous keratotic or waxy stuck-on papule or plaque.   face, hands, arms, legs Pinkness and scale  Right upper lip Smooth white papule(s).    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 It is often associated with allergic rhinitis and/or asthma and can require treatment with topical medications, phototherapy, or in severe cases a biologic medication called Dupixent in children and adults.  Psoriasis is a chronic non-curable, but treatable genetic/hereditary disease that may have other systemic features affecting other organ systems such as joints (Psoriatic Arthritis). It is associated with an increased risk of inflammatory bowel disease, heart disease, non-alcoholic fatty liver disease, and depression.    Start Opzelura cream bid Continue Mometasone cream qd up to 5 times per week  Patient declines systemic treatment with Dupixent but may consider [place] - information given today on [place].  Will consider at  follow-up.  Ruxolitinib Phosphate (OPZELURA) 1.5 % CREA - face, hands, arms, legs Apply 1 application topically 2 (two) times daily.  Related Medications mometasone (ELOCON) 0.1 % cream Apply 1 application topically 3 (three) times a week. Monday, Wednesday, Friday  [place] Right upper lip Benign appearing. Akleif cream qd - samples given  Inflamed seborrheic keratosis Trunk, extremities, forehead x 34  Destruction of lesion - Trunk, extremities, forehead Complexity: simple   Destruction method: cryotherapy   Informed consent: discussed and consent obtained   Timeout:  patient name, date of birth, surgical site, and procedure verified Lesion destroyed using liquid nitrogen: Yes   Region frozen until ice ball extended beyond lesion: Yes   Outcome: patient tolerated procedure well with no complications   Post-procedure details: wound care instructions given    Return in about 7 months (around 02/07/2021).  I, Ashok Cordia, CMA, am acting as scribe for Sarina Ser, MD .  Documentation: I have reviewed the above documentation for accuracy and completeness, and I agree with the above.  Sarina Ser, MD

## 2020-07-08 NOTE — Patient Instructions (Signed)

## 2020-07-22 ENCOUNTER — Encounter: Payer: Self-pay | Admitting: Dermatology

## 2021-02-09 ENCOUNTER — Ambulatory Visit: Payer: Medicare Other | Admitting: Dermatology

## 2021-02-17 ENCOUNTER — Ambulatory Visit: Payer: Medicare Other | Admitting: Dermatology

## 2021-03-09 ENCOUNTER — Telehealth: Payer: Self-pay | Admitting: Acute Care

## 2021-03-09 NOTE — Telephone Encounter (Signed)
Left message for pt to call back to schedule f/u lung screening CT scan.  ?

## 2021-03-10 ENCOUNTER — Other Ambulatory Visit: Payer: Self-pay | Admitting: *Deleted

## 2021-03-10 DIAGNOSIS — F17210 Nicotine dependence, cigarettes, uncomplicated: Secondary | ICD-10-CM

## 2021-03-10 DIAGNOSIS — Z87891 Personal history of nicotine dependence: Secondary | ICD-10-CM

## 2021-03-14 ENCOUNTER — Other Ambulatory Visit
Admission: RE | Admit: 2021-03-14 | Discharge: 2021-03-14 | Disposition: A | Discharge status: Home / Self Care | Payer: Medicare Other | Source: Ambulatory Visit | Attending: Pulmonary Disease | Admitting: Pulmonary Disease

## 2021-03-14 DIAGNOSIS — R0602 Shortness of breath: Secondary | ICD-10-CM | POA: Insufficient documentation

## 2021-03-14 LAB — D-DIMER, QUANTITATIVE: D-Dimer, Quant: 0.51 ug/mL-FEU — ABNORMAL HIGH (ref 0.00–0.50)

## 2021-03-21 ENCOUNTER — Other Ambulatory Visit: Payer: Self-pay

## 2021-03-21 ENCOUNTER — Ambulatory Visit
Admission: RE | Admit: 2021-03-21 | Discharge: 2021-03-21 | Disposition: A | Discharge status: Home / Self Care | Payer: Medicare Other | Source: Ambulatory Visit | Attending: Acute Care | Admitting: Acute Care

## 2021-03-21 DIAGNOSIS — Z87891 Personal history of nicotine dependence: Secondary | ICD-10-CM

## 2021-03-21 DIAGNOSIS — F17210 Nicotine dependence, cigarettes, uncomplicated: Secondary | ICD-10-CM

## 2021-03-23 ENCOUNTER — Other Ambulatory Visit: Payer: Self-pay | Admitting: Acute Care

## 2021-03-23 DIAGNOSIS — Z87891 Personal history of nicotine dependence: Secondary | ICD-10-CM

## 2021-03-24 ENCOUNTER — Ambulatory Visit: Payer: Medicare Other | Admitting: Dermatology

## 2021-04-28 ENCOUNTER — Encounter: Payer: Self-pay | Admitting: Emergency Medicine

## 2021-04-28 ENCOUNTER — Emergency Department: Payer: Medicare Other

## 2021-04-28 ENCOUNTER — Emergency Department
Admission: EM | Admit: 2021-04-28 | Discharge: 2021-04-29 | Disposition: A | Discharge status: Home / Self Care | Payer: Medicare Other | Attending: Emergency Medicine | Admitting: Emergency Medicine

## 2021-04-28 DIAGNOSIS — R791 Abnormal coagulation profile: Secondary | ICD-10-CM | POA: Diagnosis not present

## 2021-04-28 DIAGNOSIS — M549 Dorsalgia, unspecified: Secondary | ICD-10-CM | POA: Diagnosis present

## 2021-04-28 DIAGNOSIS — J449 Chronic obstructive pulmonary disease, unspecified: Secondary | ICD-10-CM | POA: Diagnosis not present

## 2021-04-28 DIAGNOSIS — M62830 Muscle spasm of back: Secondary | ICD-10-CM | POA: Diagnosis not present

## 2021-04-28 DIAGNOSIS — M546 Pain in thoracic spine: Secondary | ICD-10-CM

## 2021-04-28 LAB — CBC WITH DIFFERENTIAL/PLATELET
Abs Immature Granulocytes: 0.03 10*3/uL (ref 0.00–0.07)
Basophils Absolute: 0.1 10*3/uL (ref 0.0–0.1)
Basophils Relative: 1 %
Eosinophils Absolute: 0.2 10*3/uL (ref 0.0–0.5)
Eosinophils Relative: 2 %
HCT: 50.9 % — ABNORMAL HIGH (ref 36.0–46.0)
Hemoglobin: 16.8 g/dL — ABNORMAL HIGH (ref 12.0–15.0)
Immature Granulocytes: 0 %
Lymphocytes Relative: 29 %
Lymphs Abs: 2.6 10*3/uL (ref 0.7–4.0)
MCH: 29.6 pg (ref 26.0–34.0)
MCHC: 33 g/dL (ref 30.0–36.0)
MCV: 89.6 fL (ref 80.0–100.0)
Monocytes Absolute: 0.7 10*3/uL (ref 0.1–1.0)
Monocytes Relative: 8 %
Neutro Abs: 5.5 10*3/uL (ref 1.7–7.7)
Neutrophils Relative %: 60 %
Platelets: 190 10*3/uL (ref 150–400)
RBC: 5.68 MIL/uL — ABNORMAL HIGH (ref 3.87–5.11)
RDW: 13.4 % (ref 11.5–15.5)
WBC: 9.1 10*3/uL (ref 4.0–10.5)
nRBC: 0 % (ref 0.0–0.2)

## 2021-04-28 LAB — COMPREHENSIVE METABOLIC PANEL
ALT: 13 U/L (ref 0–44)
AST: 18 U/L (ref 15–41)
Albumin: 3.7 g/dL (ref 3.5–5.0)
Alkaline Phosphatase: 73 U/L (ref 38–126)
Anion gap: 9 (ref 5–15)
BUN: 12 mg/dL (ref 8–23)
CO2: 29 mmol/L (ref 22–32)
Calcium: 9.2 mg/dL (ref 8.9–10.3)
Chloride: 100 mmol/L (ref 98–111)
Creatinine, Ser: 0.74 mg/dL (ref 0.44–1.00)
GFR, Estimated: >60 mL/min (ref >60)
Glucose, Bld: 105 mg/dL — ABNORMAL HIGH (ref 70–99)
Potassium: 3.9 mmol/L (ref 3.5–5.1)
Sodium: 138 mmol/L (ref 135–145)
Total Bilirubin: 0.4 mg/dL (ref 0.3–1.2)
Total Protein: 7.8 g/dL (ref 6.5–8.1)

## 2021-04-28 LAB — TROPONIN I (HIGH SENSITIVITY): Troponin I (High Sensitivity): 4 ng/L (ref <18)

## 2021-04-28 LAB — D-DIMER, QUANTITATIVE: D-Dimer, Quant: 0.51 ug/mL-FEU — ABNORMAL HIGH (ref 0.00–0.50)

## 2021-04-28 NOTE — ED Triage Notes (Signed)
Pt presents via POV with complaints of mid upper back pain starting this AM that has gotten progressively worse. Hx of COPD and the patient states that the pain is worse when taking deep breaths. Denies fall or any injury.  ?

## 2021-04-29 DIAGNOSIS — M62830 Muscle spasm of back: Secondary | ICD-10-CM | POA: Diagnosis not present

## 2021-04-29 MED ORDER — CYCLOBENZAPRINE HCL 10 MG PO TABS: 10.0000 mg | ORAL_TABLET | Freq: Three times a day (TID) | ORAL | 0 refills | Status: AC | PRN

## 2021-04-29 MED ORDER — CYCLOBENZAPRINE HCL 10 MG PO TABS
5.0000 mg | ORAL_TABLET | Freq: Once | ORAL | Status: AC
Start: 2021-04-29 — End: 2021-04-29
  Administered 2021-04-29: 5 mg via ORAL
  Filled 2021-04-29: qty 1

## 2021-04-29 NOTE — ED Provider Notes (Signed)
? ?[facility] ?Provider Note ? ? Event Date/Time  ? First MD Initiated Contact with Patient 04/28/21 2333   ?  (approximate) ?History  ?Back Pain ? ?HPI ?Brianna Briggs is a 71 y.o. female with a stated past medical history of COPD who presents for sick left-sided back pain that began upon waking up today.  Patient states that when she got up from bed she noticed a significant pain in the left side of her chest that is worsened with deep inspiration and coughing.  Patient describes a 10/10 pain that radiates around from the back to her left flank and states that over-the-counter ibuprofen has not helped with this pain.  Patient denies any significant shortness of breath, chest pain, fever, lower extremity edema, or recent syncope.  Patient denies any recent trauma to this area or activity change ?Physical Exam  ?Triage Vital Signs: ?ED Triage Vitals  ?Enc Vitals Group  ?   BP 04/28/21 2125 (!) 139/94  ?   Pulse Rate 04/28/21 2125 (!) 102  ?   Resp 04/28/21 2125 20  ?   Temp 04/28/21 2125 97.9 ?F (36.6 ?C)  ?   Temp Source 04/28/21 2125 Oral  ?   SpO2 04/28/21 2125 93 %  ?   Weight 04/28/21 2123 134 lb (60.8 kg)  ?   Height 04/28/21 2123 '5\' 2"'$  (1.575 m)  ?   Head Circumference --   ?   Peak Flow --   ?   Pain Score 04/28/21 2123 10  ?   Pain Loc --   ?   Pain Edu? --   ?   Excl. in [place]? --   ? ?Most recent vital signs: ?Vitals:  ? 04/28/21 2337 04/29/21 0030  ?BP: 115/80 106/61  ?Pulse: 89 92  ?Resp: 17 16  ?Temp:    ?SpO2: 92% 93%  ? ?General: Awake, oriented x4. ?CV:  Good peripheral perfusion.  ?Resp:  Normal effort.  Clear to auscultation bilaterally ?Abd:  No distention.  ?Other:  Elderly Caucasian female laying in bed in no distress.  Significant tenderness to palpation as well as spasm palpated over left lower thoracic paraspinal musculature ?ED Results / Procedures / Treatments  ?Labs ?(all labs ordered are listed, but only abnormal results are displayed) ?Labs Reviewed  ?CBC WITH  DIFFERENTIAL/PLATELET - Abnormal; Notable for the following components:  ?    Result Value  ? RBC 5.68 (*)   ? Hemoglobin 16.8 (*)   ? HCT 50.9 (*)   ? All other components within normal limits  ?COMPREHENSIVE METABOLIC PANEL - Abnormal; Notable for the following components:  ? Glucose, Bld 105 (*)   ? All other components within normal limits  ?D-DIMER, QUANTITATIVE - Abnormal; Notable for the following components:  ? D-Dimer, Quant 0.51 (*)   ? All other components within normal limits  ?TROPONIN I (HIGH SENSITIVITY)  ?TROPONIN I (HIGH SENSITIVITY)  ? ?EKG ?ED ECG REPORT ?I, Naaman Plummer, the attending physician, personally viewed and interpreted this ECG. ?Date: 04/29/2021 ?EKG Time: 2129 ?Rate: 96 ?Rhythm: normal sinus rhythm ?QRS Axis: normal ?Intervals: normal ?ST/T Wave abnormalities: normal ?Narrative Interpretation: no evidence of acute ischemia ?RADIOLOGY ?ED MD interpretation: 2 view chest x-ray interpreted by me shows no evidence of acute abnormalities including no pneumonia, pneumothorax, or widened mediastinum ?2 view x-ray of the thoracic spine as interpreted by me shows degenerative changes without acute abnormality ?-Agree with radiology assessment ?Official radiology report(s): ?DG Chest 2 View ? ?  Result Date: 04/28/2021 ?CLINICAL DATA:  Upper back pain for several hours, initial encounter EXAM: CHEST - 2 VIEW COMPARISON:  03/21/2021 CT FINDINGS: Cardiac shadow is within normal limits. Aortic calcifications are noted. The lungs are hyperinflated but clear. Right breast implant is noted. No acute bony abnormality noted. IMPRESSION: COPD without acute abnormality. Electronically Signed   By: Inez Catalina M.D.   On: 04/28/2021 22:04  ? ?DG Thoracic Spine 2 View ? ?Result Date: 04/28/2021 ?CLINICAL DATA:  Upper back pain for several hours, initial encounter EXAM: THORACIC SPINE 2 VIEWS COMPARISON:  CT from 03/21/2021 FINDINGS: Vertebral body height is well maintained. Mild osteophytic changes are  noted. No paraspinal mass or pedicle abnormality is seen. IMPRESSION: Degenerative changes of the thoracic spine without acute abnormality. Electronically Signed   By: Inez Catalina M.D.   On: 04/28/2021 22:09   ?PROCEDURES: ?Critical Care performed: No ?.1-3 Lead EKG Interpretation ?Performed by: Naaman Plummer, MD ?Authorized by: Naaman Plummer, MD  ? ?  Interpretation: normal   ?  ECG rate:  91 ?  ECG rate assessment: normal   ?  Rhythm: sinus rhythm   ?  Ectopy: none   ?  Conduction: normal   ?MEDICATIONS ORDERED IN ED: ?Medications  ?cyclobenzaprine (FLEXERIL) tablet 5 mg (5 mg Oral Given 04/29/21 0036)  ? ?IMPRESSION / MDM / ASSESSMENT AND PLAN / ED COURSE  ?I reviewed the triage vital signs and the nursing notes. ?             ?               ?Patient presents for thoracic back pain. ?Given History and Exam the patient appears to be at low risk for Spinal Cord Compression Syndrome, Vertebral Malignancy/Mets, acute Spinal Fracture, Vertebral Osteomyelitis, Epidural Abscess, Infected or Obstructing Kidney Stone. ? ?Their presentation appears most likely to be secondary to non-emergent musculoskeletal etiology vs non-emergent disc herniation. ? ?ED Workup: Laboratory evaluation only significant for mildly elevated D-dimer to 0.51.  This is negative per age-adjusted dimer protocols.  Patient denies any significant shortness of breath.  Patient is not tachycardic, hypoxic, and well score of 0 ? ?Disposition: Discharge. Strict return precautions discussed with patient with full understanding. Advised patient to follow up promptly with primary care provider ? ?  ?FINAL CLINICAL IMPRESSION(S) / ED DIAGNOSES  ? ?Final diagnoses:  ?Acute left-sided thoracic back pain  ? ?Rx / DC Orders  ? ?ED Discharge Orders   ? ?      Ordered  ?  cyclobenzaprine (FLEXERIL) 10 MG tablet  3 times daily PRN       ? 04/29/21 0041  ? ?  ?  ? ?  ? ?Note:  This document was prepared using Dragon voice recognition software and may include  unintentional dictation errors. ?  ?Naaman Plummer, MD ?04/29/21 0106 ? ?

## 2021-05-02 ENCOUNTER — Ambulatory Visit (INDEPENDENT_AMBULATORY_CARE_PROVIDER_SITE_OTHER): Payer: Medicare Other | Admitting: Dermatology

## 2021-05-02 DIAGNOSIS — L821 Other seborrheic keratosis: Secondary | ICD-10-CM

## 2021-05-02 DIAGNOSIS — L820 Inflamed seborrheic keratosis: Secondary | ICD-10-CM | POA: Diagnosis not present

## 2021-05-02 DIAGNOSIS — D229 Melanocytic nevi, unspecified: Secondary | ICD-10-CM

## 2021-05-02 DIAGNOSIS — L209 Atopic dermatitis, unspecified: Secondary | ICD-10-CM

## 2021-05-02 DIAGNOSIS — L2089 Other atopic dermatitis: Secondary | ICD-10-CM

## 2021-05-02 DIAGNOSIS — L409 Psoriasis, unspecified: Secondary | ICD-10-CM | POA: Diagnosis not present

## 2021-05-02 DIAGNOSIS — L578 Other skin changes due to chronic exposure to nonionizing radiation: Secondary | ICD-10-CM

## 2021-05-02 MED ORDER — MOMETASONE FUROATE 0.1 % EX CREA: 1.0000 "application " | TOPICAL_CREAM | CUTANEOUS | 3 refills | Status: DC

## 2021-05-02 NOTE — Progress Notes (Signed)
? ?Follow-Up Visit ?  ?Subjective  ?Brianna Briggs is a 71 y.o. female who presents for the following: other atopic dermatitis (7 month follow up, Patient hx of atopic dermatitis with psoriasis overlap. Using mometasone. Reports some active areas at hands and finger tips ) and OTHER (Patient has several other spots at hands and arms he would like checked. ). ?The patient has spots, moles and lesions to be evaluated, some may be new or changing and the patient has concerns that these could be cancer. ? ?The following portions of the chart were reviewed this encounter and updated as appropriate:  Tobacco  Allergies  Meds  Problems  Med Hx  Surg Hx  Fam Hx   ?  ?Review of Systems: No other skin or systemic complaints except as noted in HPI or Assessment and Plan. ? ?Objective  ?Well appearing patient in no apparent distress; mood and affect are within normal limits. ? ?A focused examination was performed including b/l legs and arms , face, hands . Relevant physical exam findings are noted in the Assessment and Plan. ? ?face, hands, legs, arms ?Pinkness and scale ? ?left arm and left leg x 5 (5) ?Erythematous stuck-on, waxy papule or plaque ? ? ?Assessment & Plan  ? 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 face, hands, legs, arms ?Chronic and persistent condition with duration or expected duration over one year. Condition is symptomatic / bothersome to patient. Not to goal. ?Atopic dermatitis (eczema) is a chronic, relapsing, pruritic condition that can significantly affect quality of life. It is often associated with allergic rhinitis and/or asthma and can require treatment with topical medications, phototherapy, or in severe cases a biologic medication called Dupixent in children and adults.  ?Psoriasis is a chronic non-curable, but treatable genetic/hereditary disease that may have other systemic features affecting other organ systems such as joints (Psoriatic Arthritis). It is associated with  an increased risk of inflammatory bowel disease, heart disease, non-alcoholic fatty liver disease, and depression.   ? ?Continue Mometasone cream qd up to 5 times per week can also use at face by use no longer than 2 weeks  ? ?Patient unable to start opzelura or otezla due to insurance coverage  ? ?patient declines treatment with dupixent at this time,  may consider in summer .  Despite her severe condition and multiple offers over many years for prescribing more aggressive treatment, the pt continues to decline these treatments. ? ?Topical steroids (such as triamcinolone, fluocinolone, fluocinonide, mometasone, clobetasol, halobetasol, betamethasone, hydrocortisone) can cause thinning and lightening of the skin if they are used for too long in the same area. Your physician has selected the right strength medicine for your problem and area affected on the body. Please use your medication only as directed by your physician to prevent side effects.  ? ?Inflamed seborrheic keratosis (5) ?left arm and left leg x 5 ?Irritated and bothering patient  ? ?Destruction of lesion - left arm and left leg x 5 ?Complexity: simple   ?Destruction method: cryotherapy   ?Informed consent: discussed and consent obtained   ?Timeout:  patient name, date of birth, surgical site, and procedure verified ?Lesion destroyed using liquid nitrogen: Yes   ?Region frozen until ice ball extended beyond lesion: Yes   ?Outcome: patient tolerated procedure well with no complications   ?Post-procedure details: wound care instructions given   ?Additional details:  Prior to procedure, discussed risks of blister formation, small wound, skin dyspigmentation, or rare scar following cryotherapy. Recommend Vaseline ointment  to treated areas while healing. ? ?Related Medications ?Ruxolitinib Phosphate (OPZELURA) 1.5 % CREA ?Apply 1 application topically 2 (two) times daily. ?mometasone (ELOCON) 0.1 % cream ?Apply 1 application. topically See admin  instructions. Apply to affected areas of hands, arms, legs 5 days weekly. ? ?Seborrheic Keratoses ?- Stuck-on, waxy, tan-brown papules and/or plaques  ?- Benign-appearing ?- Discussed benign etiology and prognosis. ?- Observe ?- Call for any changes ? ?Melanocytic Nevi ?- Tan-brown and/or pink-flesh-colored symmetric macules and papules forehead  ?- Benign appearing on exam today ?- Observation ?- Call clinic for new or changing moles ?- Recommend daily use of broad spectrum spf 30+ sunscreen to sun-exposed areas.  ? ?Actinic Damage ?- chronic, secondary to cumulative UV radiation exposure/sun exposure over time ?- diffuse scaly erythematous macules with underlying dyspigmentation ?- Recommend daily broad spectrum sunscreen SPF 30+ to sun-exposed areas, reapply every 2 hours as needed.  ?- Recommend staying in the shade or wearing long sleeves, sun glasses (UVA+UVB protection) and wide brim hats (4-inch brim around the entire circumference of the hat). ?- Call for new or changing lesions. ? ?Return for 2 - 3 month atopic follow up. ?I, Ruthell Rummage, CMA, am acting as scribe for Sarina Ser, MD. ?Documentation: I have reviewed the above documentation for accuracy and completeness, and I agree with the above. ? ?Sarina Ser, MD ? ?

## 2021-05-02 NOTE — Patient Instructions (Addendum)
Continue Mometasone cream qd up to 5 times per week can also use at face by use no longer than 2 weeks  ? ? ?Topical steroids (such as triamcinolone, fluocinolone, fluocinonide, mometasone, clobetasol, halobetasol, betamethasone, hydrocortisone) can cause thinning and lightening of the skin if they are used for too long in the same area. Your physician has selected the right strength medicine for your problem and area affected on the body. Please use your medication only as directed by your physician to prevent side effects.   ? ?Cryotherapy Aftercare ? ?Wash gently with soap and water everyday.   ?Apply Vaseline and Band-Aid daily until healed.  ? ? ?Seborrheic Keratosis ? ?What causes seborrheic keratoses? ?Seborrheic keratoses are harmless, common skin growths that first appear during adult life.  As time goes by, more growths appear.  Some people may develop a large number of them.  Seborrheic keratoses appear on both covered and uncovered body parts.  They are not caused by sunlight.  The tendency to develop seborrheic keratoses can be inherited.  They vary in color from skin-colored to gray, brown, or even black.  They can be either smooth or have a rough, warty surface.   ?Seborrheic keratoses are superficial and look as if they were stuck on the skin.  Under the microscope this type of keratosis looks like layers upon layers of skin.  That is why at times the top layer may seem to fall off, but the rest of the growth remains and re-grows.   ? ?Treatment ?Seborrheic keratoses do not need to be treated, but can easily be removed in the office.  Seborrheic keratoses often cause symptoms when they rub on clothing or jewelry.  Lesions can be in the way of shaving.  If they become inflamed, they can cause itching, soreness, or burning.  Removal of a seborrheic keratosis can be accomplished by freezing, burning, or surgery. ?If any spot bleeds, scabs, or grows rapidly, please return to have it checked, as these can  be an indication of a skin cancer. ? ? ? ? ? ? ? ? ? ?If You Need Anything After Your Visit ? ?If you have any questions or concerns for your doctor, please call our main line at [phone] and press option 4 to reach your doctor's medical assistant. If no one answers, please leave a voicemail as directed and we will return your call as soon as possible. Messages left after 4 pm will be answered the following business day.  ? ?You may also send [place] a message via MyChart. We typically respond to MyChart messages within 1-2 business days. ? ?For prescription refills, please ask your pharmacy to contact our office. Our fax number is [phone]. ? ?If you have an urgent issue when the clinic is closed that cannot wait until the next business day, you can page your doctor at the number below.   ? ?Please note that while we do our best to be available for urgent issues outside of office hours, we are not available 24/7.  ? ?If you have an urgent issue and are unable to reach [place], you may choose to seek medical care at your doctor's office, retail clinic, urgent care center, or emergency room. ? ?If you have a medical emergency, please immediately call 911 or go to the emergency department. ? ?Pager Numbers ? ?- Dr. Nehemiah Massed: [phone] ? ?- Dr. Laurence Ferrari: [phone] ? ?- Dr. Nicole Kindred: [phone] ? ?In the event of inclement weather, please call our main line at  [phone] for an update on the status of any delays or closures. ? ?Dermatology Medication Tips: ?Please keep the boxes that topical medications come in in order to help keep track of the instructions about where and how to use these. Pharmacies typically print the medication instructions only on the boxes and not directly on the medication tubes.  ? ?If your medication is too expensive, please contact our office at [phone] option 4 or send [place] a message through [place].  ? ?We are unable to tell what your co-pay for medications will be in advance as this  is different depending on your insurance coverage. However, we may be able to find a substitute medication at lower cost or fill out paperwork to get insurance to cover a needed medication.  ? ?If a prior authorization is required to get your medication covered by your insurance company, please allow [place] 1-2 business days to complete this process. ? ?Drug prices often vary depending on where the prescription is filled and some pharmacies may offer cheaper prices. ? ?The website www.goodrx.com contains coupons for medications through different pharmacies. The prices here do not account for what the cost may be with help from insurance (it may be cheaper with your insurance), but the website can give you the price if you did not use any insurance.  ?- You can print the associated coupon and take it with your prescription to the pharmacy.  ?- You may also stop by our office during regular business hours and pick up a GoodRx coupon card.  ?- If you need your prescription sent electronically to a different pharmacy, notify our office through [facility] or by phone at [phone] option 4. ? ? ? ? ?Si Usted Necesita Algo Despu?s de Su Visita ? ?Tambi?n puede enviarnos un mensaje a trav?s de MyChart. Por lo general respondemos a los mensajes de MyChart en el transcurso de 1 a 2 d?as h?biles. ? ?Para renovar recetas, por favor pida a su farmacia que se ponga en contacto con nuestra oficina. Nuestro n?mero de fax es el [phone]. ? ?Si tiene un asunto urgente cuando la cl?nica est? cerrada y que no puede esperar hasta el siguiente d?a h?bil, puede llamar/localizar a su doctor(a) al n?mero que aparece a continuaci?n.  ? ?Por favor, tenga en cuenta que aunque hacemos todo lo posible para estar disponibles para asuntos urgentes fuera del horario de oficina, no estamos disponibles las 24 horas del d?a, los 7 d?as de la semana.  ? ?Si tiene un problema urgente y no puede comunicarse con nosotros, puede optar por  buscar atenci?n m?dica  en el consultorio de su doctor(a), en una cl?nica privada, en un centro de atenci?n urgente o en una sala de emergencias. ? ?Si tiene Engineer, maintenance (IT) m?dica, por favor llame inmediatamente al 911 o vaya a la sala de emergencias. ? ?N?meros de b?per ? ?- Dr. Nehemiah Massed: [phone] ? ?- Dra. Moye: [phone] ? ?- Dra. Nicole Kindred: [phone] ? ?En caso de inclemencias del tiempo, por favor llame a nuestra l?nea principal al [phone] para una actualizaci?n sobre el estado de cualquier retraso o cierre. ? ?Consejos para la medicaci?n en dermatolog?a: ?Por favor, guarde las cajas en las que vienen los medicamentos de uso t?pico para ayudarle a seguir las instrucciones sobre d?nde y c?mo usarlos. Las farmacias generalmente imprimen las instrucciones del medicamento s?lo en las cajas y no directamente en los tubos del [place].  ? ?Si su medicamento es muy caro, por favor, p?ngase en contacto con  Cleotis Nipper oficina llamando al [phone] y presione la opci?n 4 o env?enos un mensaje a trav?s de MyChart.  ? ?No podemos decirle cu?l ser? su copago por los medicamentos por adelantado ya que esto es diferente dependiendo de la cobertura de su seguro. Sin embargo, es posible que podamos encontrar un medicamento sustituto a Electrical engineer un formulario para que el seguro cubra el medicamento que se considera necesario.  ? ?Si se requiere Ardelia Mems autorizaci?n previa para que su compa??a de seguros [place] su medicamento, por favor perm?tanos de 1 a 2 d?as h?biles para completar este proceso. ? ?Los precios de los medicamentos var?an con frecuencia dependiendo del Environmental consultant de d?nde se surte la receta y alguna farmacias pueden ofrecer precios m?s baratos. ? ?El sitio web www.goodrx.com tiene cupones para medicamentos de Airline pilot. Los precios aqu? no tienen en cuenta lo que podr?a costar con la ayuda del seguro (puede ser m?s barato con su seguro), pero el sitio web puede darle el precio si no  utiliz? ning?n seguro.  ?- Puede imprimir el cup?n correspondiente y llevarlo con su receta a la farmacia.  ?- Tambi?n puede pasar por nuestra oficina durante el horario de atenci?n regular y Corporate treasurer ta

## 2021-05-06 ENCOUNTER — Encounter: Payer: Self-pay | Admitting: Dermatology

## 2021-06-06 ENCOUNTER — Other Ambulatory Visit: Payer: Self-pay

## 2021-06-06 ENCOUNTER — Inpatient Hospital Stay
Admission: EM | Admit: 2021-06-06 | Discharge: 2021-06-11 | DRG: 392 | Disposition: A | Discharge status: Home Health | Payer: Medicare Other | Attending: Surgery | Admitting: Surgery

## 2021-06-06 ENCOUNTER — Emergency Department: Payer: Medicare Other

## 2021-06-06 DIAGNOSIS — K5720 Diverticulitis of large intestine with perforation and abscess without bleeding: Secondary | ICD-10-CM | POA: Diagnosis present

## 2021-06-06 DIAGNOSIS — Z9011 Acquired absence of right breast and nipple: Secondary | ICD-10-CM | POA: Diagnosis not present

## 2021-06-06 DIAGNOSIS — K5732 Diverticulitis of large intestine without perforation or abscess without bleeding: Secondary | ICD-10-CM | POA: Diagnosis present

## 2021-06-06 DIAGNOSIS — Z841 Family history of disorders of kidney and ureter: Secondary | ICD-10-CM

## 2021-06-06 DIAGNOSIS — F17210 Nicotine dependence, cigarettes, uncomplicated: Secondary | ICD-10-CM | POA: Diagnosis present

## 2021-06-06 DIAGNOSIS — Z85828 Personal history of other malignant neoplasm of skin: Secondary | ICD-10-CM | POA: Diagnosis not present

## 2021-06-06 DIAGNOSIS — Z79899 Other long term (current) drug therapy: Secondary | ICD-10-CM

## 2021-06-06 DIAGNOSIS — E785 Hyperlipidemia, unspecified: Secondary | ICD-10-CM | POA: Diagnosis present

## 2021-06-06 DIAGNOSIS — Z7952 Long term (current) use of systemic steroids: Secondary | ICD-10-CM | POA: Diagnosis not present

## 2021-06-06 DIAGNOSIS — R031 Nonspecific low blood-pressure reading: Secondary | ICD-10-CM | POA: Diagnosis present

## 2021-06-06 DIAGNOSIS — Z853 Personal history of malignant neoplasm of breast: Secondary | ICD-10-CM | POA: Diagnosis not present

## 2021-06-06 DIAGNOSIS — B964 Proteus (mirabilis) (morganii) as the cause of diseases classified elsewhere: Secondary | ICD-10-CM | POA: Diagnosis present

## 2021-06-06 DIAGNOSIS — Z8719 Personal history of other diseases of the digestive system: Secondary | ICD-10-CM | POA: Diagnosis not present

## 2021-06-06 LAB — COMPREHENSIVE METABOLIC PANEL
ALT: 17 U/L (ref 0–44)
AST: 21 U/L (ref 15–41)
Albumin: 3.4 g/dL — ABNORMAL LOW (ref 3.5–5.0)
Alkaline Phosphatase: 75 U/L (ref 38–126)
Anion gap: 12 (ref 5–15)
BUN: 12 mg/dL (ref 8–23)
CO2: 30 mmol/L (ref 22–32)
Calcium: 9.2 mg/dL (ref 8.9–10.3)
Chloride: 91 mmol/L — ABNORMAL LOW (ref 98–111)
Creatinine, Ser: 0.92 mg/dL (ref 0.44–1.00)
GFR, Estimated: >60 mL/min (ref >60)
Glucose, Bld: 119 mg/dL — ABNORMAL HIGH (ref 70–99)
Potassium: 3.7 mmol/L (ref 3.5–5.1)
Sodium: 133 mmol/L — ABNORMAL LOW (ref 135–145)
Total Bilirubin: 1.1 mg/dL (ref 0.3–1.2)
Total Protein: 8 g/dL (ref 6.5–8.1)

## 2021-06-06 LAB — CBC
HCT: 50.8 % — ABNORMAL HIGH (ref 36.0–46.0)
Hemoglobin: 16.8 g/dL — ABNORMAL HIGH (ref 12.0–15.0)
MCH: 29.8 pg (ref 26.0–34.0)
MCHC: 33.1 g/dL (ref 30.0–36.0)
MCV: 90.2 fL (ref 80.0–100.0)
Platelets: 247 10*3/uL (ref 150–400)
RBC: 5.63 MIL/uL — ABNORMAL HIGH (ref 3.87–5.11)
RDW: 12.8 % (ref 11.5–15.5)
WBC: 15.7 10*3/uL — ABNORMAL HIGH (ref 4.0–10.5)
nRBC: 0 % (ref 0.0–0.2)

## 2021-06-06 LAB — LIPASE, BLOOD: Lipase: 24 U/L (ref 11–51)

## 2021-06-06 LAB — LACTIC ACID, PLASMA: Lactic Acid, Venous: 1.2 mmol/L (ref 0.5–1.9)

## 2021-06-06 MED ORDER — TRAMADOL HCL 50 MG PO TABS
50.0000 mg | ORAL_TABLET | Freq: Four times a day (QID) | ORAL | Status: DC | PRN
  Administered 2021-06-07 – 2021-06-10 (×5): 50 mg via ORAL
  Filled 2021-06-06 (×5): qty 1

## 2021-06-06 MED ORDER — SODIUM CHLORIDE 0.9 % IV SOLN: INTRAVENOUS | Status: DC

## 2021-06-06 MED ORDER — MOMETASONE FUROATE 0.1 % EX CREA
1.0000 "application " | TOPICAL_CREAM | CUTANEOUS | Status: DC
  Administered 2021-06-07 – 2021-06-11 (×4): 1 via TOPICAL
  Filled 2021-06-06 (×2): qty 15

## 2021-06-06 MED ORDER — ONDANSETRON HCL 4 MG/2ML IJ SOLN
4.0000 mg | Freq: Four times a day (QID) | INTRAMUSCULAR | Status: DC | PRN
  Administered 2021-06-07: 4 mg via INTRAVENOUS
  Filled 2021-06-06: qty 2

## 2021-06-06 MED ORDER — IOHEXOL 300 MG/ML  SOLN
100.0000 mL | Freq: Once | INTRAMUSCULAR | Status: AC | PRN
Start: 2021-06-06 — End: 2021-06-06
  Administered 2021-06-06: 100 mL via INTRAVENOUS

## 2021-06-06 MED ORDER — SODIUM CHLORIDE 0.9 % IV SOLN
1.0000 g | Freq: Two times a day (BID) | INTRAVENOUS | Status: DC
  Administered 2021-06-06 – 2021-06-07 (×2): 1 g via INTRAVENOUS
  Filled 2021-06-06 (×2): qty 20

## 2021-06-06 MED ORDER — DOCUSATE SODIUM 100 MG PO CAPS: 100.0000 mg | ORAL_CAPSULE | Freq: Two times a day (BID) | ORAL | Status: DC | PRN

## 2021-06-06 MED ORDER — ONDANSETRON 4 MG PO TBDP: 4.0000 mg | ORAL_TABLET | Freq: Four times a day (QID) | ORAL | Status: DC | PRN

## 2021-06-06 MED ORDER — MORPHINE SULFATE (PF) 2 MG/ML IV SOLN
2.0000 mg | INTRAVENOUS | Status: DC | PRN
  Administered 2021-06-07 (×3): 2 mg via INTRAVENOUS
  Filled 2021-06-06 (×3): qty 1

## 2021-06-06 MED ORDER — RUXOLITINIB PHOSPHATE 1.5 % EX CREA: 1.0000 "application " | TOPICAL_CREAM | Freq: Two times a day (BID) | CUTANEOUS | Status: DC

## 2021-06-06 NOTE — H&P (Signed)
Subjective:   CC: Recurrent diverticulitis  HPI:  Brianna Briggs is a 71 y.o. female who was consulted by [name] for issue above.  Symptoms were first noted a few days ago. Pain is sharp, left lower quad.  Associated with nausea, exacerbated by touch.  History of sigmoid diverticulitis couple years ago treated with IR guided drainage.  Patient wanted to postpone any surgical management at that time.   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  Past Surgical History:  Past Surgical History:  Procedure Laterality Date   ABDOMINAL HYSTERECTOMY     COLONOSCOPY WITH PROPOFOL N/A 05/01/2019   Procedure: COLONOSCOPY WITH PROPOFOL;  Surgeon: Toledo, Benay Pike, MD;  Location: [facility] ENDOSCOPY;  Service: Gastroenterology;  Laterality: N/A;   ESOPHAGOGASTRODUODENOSCOPY (EGD) WITH PROPOFOL N/A 05/01/2019   Procedure: ESOPHAGOGASTRODUODENOSCOPY (EGD) WITH PROPOFOL;  Surgeon: Toledo, Benay Pike, MD;  Location: [facility] ENDOSCOPY;  Service: Gastroenterology;  Laterality: N/A;   MASTECTOMY     SHOULDER ARTHROSCOPY WITH SUBACROMIAL DECOMPRESSION, ROTATOR CUFF REPAIR AND BICEP TENDON REPAIR Left 01/13/2008   TONSILLECTOMY     TUBAL LIGATION      Family History: family history includes Cancer in her father; Kidney failure in her mother.  Social History:  reports that she has been smoking cigarettes. She has a 60.00 pack-year smoking history. She has never used smokeless tobacco. She reports that she does not drink alcohol and does not use drugs.  Current Medications:  Prior to Admission medications   Medication Sig Start Date End Date Taking? Authorizing Provider  fluticasone (FLONASE) 50 MCG/ACT nasal spray Place 2 sprays into both nostrils daily as needed.  Patient not taking: Reported on 09/09/2019 05/02/19    [provider]  meropenem (MERREM) 1 g injection Inject into the vein. Patient not taking: Reported on 09/09/2019 07/23/19   [provider]  mometasone (ELOCON) 0.1 % cream Apply 1 application. topically See admin instructions. Apply to affected areas of hands, arms, legs 5 days weekly. 05/02/21   Ralene Bathe, MD  omeprazole (PRILOSEC) 20 MG capsule Take 1 capsule (20 mg total) by mouth 2 (two) times daily before a meal. Patient taking differently: Take 20 mg by mouth 2 (two) times daily as needed. 07/04/19   Tylene Fantasia, PA-C  ondansetron (ZOFRAN-ODT) 4 MG disintegrating tablet Take 1 tablet (4 mg total) by mouth every 6 (six) hours as needed for nausea. 07/23/19   Tylene Fantasia, PA-C  pravastatin (PRAVACHOL) 10 MG tablet Take by mouth. 08/14/19 08/13/20  [provider]  Ruxolitinib Phosphate (OPZELURA) 1.5 % CREA Apply 1 application topically 2 (two) times daily. 07/08/20   Ralene Bathe, MD  VENTOLIN HFA 108 [phone] Base) MCG/ACT inhaler Inhale 2 puffs into the lungs 2 (two) times daily. Patient not taking: Reported on 05/02/2021 04/12/21   [provider]    Allergies:  Allergies as of 06/06/2021 - Review Complete 06/06/2021  Allergen Reaction Noted   Amoxicillin-pot clavulanate Diarrhea and Rash 09/05/2013   Biaxin [clarithromycin] Diarrhea 09/05/2013   Oxycodone Itching 09/05/2013   Codeine Rash 12/06/2015   Nitrofurantoin Rash     ROS:  General: Denies weight loss, weight gain, fatigue, fevers, chills, and night sweats. Eyes: Denies blurry vision, double vision, eye pain, itchy eyes, and tearing. Ears: Denies hearing loss, earache, and ringing in ears. Nose: Denies  sinus pain, congestion, infections, runny nose, and nosebleeds. Mouth/throat: Denies hoarseness, sore throat, bleeding gums, and difficulty swallowing. Heart: Denies chest pain, palpitations, racing heart, irregular heartbeat, leg pain or swelling, and decreased activity  tolerance. Respiratory: Denies breathing difficulty, shortness of breath, wheezing, cough, and sputum. GI: Denies change in appetite, heartburn, nausea, vomiting, constipation, diarrhea, and blood in stool. GU: Denies difficulty urinating, pain with urinating, urgency, frequency, blood in urine. Musculoskeletal: Denies joint stiffness, pain, swelling, muscle weakness. Skin: Denies rash, itching, mass, tumors, sores, and boils Neurologic: Denies headache, fainting, dizziness, seizures, numbness, and tingling. Psychiatric: Denies depression, anxiety, difficulty sleeping, and memory loss. Endocrine: Denies heat or cold intolerance, and increased thirst or urination. Blood/lymph: Denies easy bruising, easy bruising, and swollen glands     Objective:     BP 91/63 (BP Location: Right Arm)   Pulse (!) 121   Temp 98.2 F (36.8 C) (Oral)   Resp 18   Ht '5\' 2"'$  (1.575 m)   Wt 57.2 kg   SpO2 92%   BMI 23.05 kg/m   Constitutional :  alert, cooperative, appears stated age, and mild distress  Lymphatics/Throat:  no asymmetry, masses, or scars  Respiratory:  clear to auscultation bilaterally  Cardiovascular:  regular rate and rhythm  Gastrointestinal: Soft, no guarding, focal TTP around LLQ site .   Musculoskeletal: Steady movement  Skin: Cool and moist   Psychiatric: Normal affect, non-agitated, not confused       LABS:     Latest Ref Rng & Units 06/06/2021    3:35 PM 04/28/2021    9:34 PM 07/22/2019    5:08 AM  CMP  Glucose 70 - 99 mg/dL 119   105   92    BUN 8 - 23 mg/dL '12   12   6    '$ Creatinine 0.44 - 1.00 mg/dL 0.92   0.74   0.75    Sodium 135 - 145 mmol/L 133   138   139    Potassium 3.5 - 5.1 mmol/L 3.7   3.9   3.4    Chloride 98 - 111 mmol/L 91   100   98    CO2 22 - 32 mmol/L 30   29   32    Calcium 8.9 - 10.3 mg/dL 9.2   9.2   8.4    Total Protein 6.5 - 8.1 g/dL 8.0   7.8     Total Bilirubin 0.3 - 1.2 mg/dL 1.1   0.4     Alkaline Phos 38 - 126 U/L 75   73     AST 15 - 41  U/L 21   18     ALT 0 - 44 U/L 17   13         Latest Ref Rng & Units 06/06/2021    3:35 PM 04/28/2021    9:34 PM 07/21/2019    5:55 AM  CBC  WBC 4.0 - 10.5 K/uL 15.7   9.1   5.9    Hemoglobin 12.0 - 15.0 g/dL 16.8   16.8   14.4    Hematocrit 36.0 - 46.0 % 50.8   50.9   42.3    Platelets 150 - 400 K/uL 247   190   229      RADS: CLINICAL DATA:  Abdominal pain and nausea.  Leukocytosis.   EXAM: CT ABDOMEN AND PELVIS WITH CONTRAST   TECHNIQUE: Multidetector CT imaging of the abdomen and pelvis was performed using the standard protocol following  bolus administration of intravenous contrast.   RADIATION DOSE REDUCTION: This exam was performed according to the departmental dose-optimization program which includes automated exposure control, adjustment of the mA and/or kV according to patient size and/or use of iterative reconstruction technique.   CONTRAST:  134m OMNIPAQUE IOHEXOL 300 MG/ML  SOLN   COMPARISON:  CT abdomen pelvis dated July 21, 2019.   FINDINGS: Lower chest: No acute abnormality.   Hepatobiliary: No focal liver abnormality is seen. No gallstones, gallbladder wall thickening, or biliary dilatation.   Pancreas: Unremarkable. No pancreatic ductal dilatation or surrounding inflammatory changes.   Spleen: Normal in size without focal abnormality.   Adrenals/Urinary Tract: The adrenal glands are unremarkable. Similar severely atrophied right kidney with cystic changes. Unchanged multifocal left renal scarring and small simple cysts. No follow-up imaging is recommended. No renal calculi or hydronephrosis. The bladder is unremarkable.   Stomach/Bowel: The stomach is within normal limits. Left-sided colonic diverticulosis. Severe wall thickening of the proximal sigmoid colon with prominent surrounding inflammatory changes and adjacent 4.8 x 2.4 cm abscess (series 2, image 55). No extraluminal air. The small bowel is unremarkable. Normal appendix.    Vascular/Lymphatic: Aortic atherosclerosis. No enlarged abdominal or pelvic lymph nodes.   Reproductive: Status post hysterectomy. No adnexal masses.   Other: No free fluid or pneumoperitoneum.   Musculoskeletal: No acute or significant osseous findings.   IMPRESSION: 1. Recurrent acute sigmoid diverticulitis with adjacent 4.8 x 2.4 cm abscess. No extraluminal air. 2. Aortic Atherosclerosis (ICD10-I70.0).     Electronically Signed   By: WTitus DubinM.D.   On: 06/06/2021 16:57 Assessment:   Diverticulitis with abscess, recurrent  Plan:   Recommended IR guided catheter drainage again and attempt to recover from this acute episode, since proceeding with surgery right away will more than likely lead to a temporary colostomy placement.  Patient despite the white count and tachycardia noted on initial vital check clinically seems stable with no signs of peritonitis at this time.  Patient and husband agrees to admission and attempt at IR guided drainage first.  Case was first brought to my attention by outpatient GI office and work-up was completed in the emergency department including the CT scan.  Discussed case with nursing supervisor and bed placement and they stated that the best way for patient to get admitted the quickest will be to still go through the emergency department despite the patient not being formally assessed by ED provider at the time of this note.  Discussed the case with the ER provider and they acknowledged plan.  Will get her assigned to room as soon as possible.  Admission orders placed at the time of this note dictation  labs/images/medications/previous chart entries reviewed personally and relevant changes/updates noted above.

## 2021-06-06 NOTE — ED Triage Notes (Signed)
Pt comes with c/o abdominal pain. Pt was seen over in office today and bloodwork done. WBC elevated. Pt has hx of diverticulitis. MD wants pt have CT scan performed. Pt has been nauseated.

## 2021-06-06 NOTE — Consult Note (Signed)
Pharmacy Antibiotic Note  Brianna Briggs is a 71 y.o. female admitted on (Not on file) with diverticulitis with abscess.  Pharmacy has been consulted for meropenem dosing. Patient has tolerated Zosyn in the past but per MD the patient developed a reaction to Zosyn while receiving outpatient IV antibiotics. Will proceed with meropenem dosing.   Plan: Meropenem 1 g IV q12h per indication and renal function  Monitor clinical picture, renal function F/U C&S, abx deescalation / LOT   Height: '5\' 2"'$  (157.5 cm) Weight: 57.2 kg (126 lb) IBW/kg (Calculated) : 50.1  Temp (24hrs), Avg:98.2 F (36.8 C), Min:98.2 F (36.8 C), Max:98.2 F (36.8 C)  Recent Labs  Lab 06/06/21 1535  WBC 15.7*  CREATININE 0.92  LATICACIDVEN 1.2    Estimated Creatinine Clearance: 44.4 mL/min (by C-G formula based on SCr of 0.92 mg/dL).    Allergies  Allergen Reactions   Amoxicillin-Pot Clavulanate Diarrhea and Rash    Other reaction(s): Unknown   Biaxin [Clarithromycin] Diarrhea    Other reaction(s): Unknown   Oxycodone Itching   Codeine Rash   Nitrofurantoin Rash    Antimicrobials this admission: 5/22 meropenem >>   Dose adjustments this admission:   Microbiology results: 5/22 BCx: pending   Thank you for allowing pharmacy to be a part of this patient's care.  Darnelle Bos, PharmD 06/06/2021 6:12 PM

## 2021-06-06 NOTE — ED Notes (Signed)
Colletta Maryland RN aware of assigned bed

## 2021-06-07 ENCOUNTER — Inpatient Hospital Stay: Payer: Medicare Other

## 2021-06-07 ENCOUNTER — Encounter: Payer: Self-pay | Admitting: Surgery

## 2021-06-07 LAB — CBC
HCT: 43.4 % (ref 36.0–46.0)
HCT: 40.5 % (ref 36.0–46.0)
Hemoglobin: 14.6 g/dL (ref 12.0–15.0)
Hemoglobin: 13.2 g/dL (ref 12.0–15.0)
MCH: 30.1 pg (ref 26.0–34.0)
MCH: 29.6 pg (ref 26.0–34.0)
MCHC: 33.6 g/dL (ref 30.0–36.0)
MCHC: 32.6 g/dL (ref 30.0–36.0)
MCV: 89.5 fL (ref 80.0–100.0)
MCV: 90.8 fL (ref 80.0–100.0)
Platelets: 217 10*3/uL (ref 150–400)
Platelets: 229 10*3/uL (ref 150–400)
RBC: 4.85 MIL/uL (ref 3.87–5.11)
RBC: 4.46 MIL/uL (ref 3.87–5.11)
RDW: 12.7 % (ref 11.5–15.5)
RDW: 12.8 % (ref 11.5–15.5)
WBC: 13.3 10*3/uL — ABNORMAL HIGH (ref 4.0–10.5)
WBC: 12.6 10*3/uL — ABNORMAL HIGH (ref 4.0–10.5)
nRBC: 0 % (ref 0.0–0.2)
nRBC: 0 % (ref 0.0–0.2)

## 2021-06-07 LAB — BASIC METABOLIC PANEL
Anion gap: 11 (ref 5–15)
BUN: 11 mg/dL (ref 8–23)
CO2: 28 mmol/L (ref 22–32)
Calcium: 8.5 mg/dL — ABNORMAL LOW (ref 8.9–10.3)
Chloride: 96 mmol/L — ABNORMAL LOW (ref 98–111)
Creatinine, Ser: 0.76 mg/dL (ref 0.44–1.00)
GFR, Estimated: >60 mL/min (ref >60)
Glucose, Bld: 92 mg/dL (ref 70–99)
Potassium: 3.6 mmol/L (ref 3.5–5.1)
Sodium: 135 mmol/L (ref 135–145)

## 2021-06-07 LAB — PROTIME-INR
INR: 1.2 (ref 0.8–1.2)
Prothrombin Time: 15.1 seconds (ref 11.4–15.2)

## 2021-06-07 MED ORDER — MORPHINE SULFATE (PF) 2 MG/ML IV SOLN: 2.0000 mg | INTRAVENOUS | Status: DC | PRN

## 2021-06-07 MED ORDER — CELECOXIB 200 MG PO CAPS
200.0000 mg | ORAL_CAPSULE | Freq: Every day | ORAL | Status: DC
  Administered 2021-06-07 – 2021-06-11 (×5): 200 mg via ORAL
  Filled 2021-06-07 (×5): qty 1

## 2021-06-07 MED ORDER — SODIUM CHLORIDE 0.9 % IV BOLUS
500.0000 mL | Freq: Once | INTRAVENOUS | Status: AC
Start: 2021-06-07 — End: 2021-06-07
  Administered 2021-06-07: 500 mL via INTRAVENOUS

## 2021-06-07 MED ORDER — SODIUM CHLORIDE 0.9% FLUSH
5.0000 mL | Freq: Three times a day (TID) | INTRAVENOUS | Status: DC
  Administered 2021-06-07 – 2021-06-11 (×13): 5 mL

## 2021-06-07 MED ORDER — HYDROCODONE-ACETAMINOPHEN 5-325 MG PO TABS: 1.0000 | ORAL_TABLET | Freq: Four times a day (QID) | ORAL | Status: DC | PRN

## 2021-06-07 MED ORDER — ENOXAPARIN SODIUM 40 MG/0.4ML IJ SOSY
40.0000 mg | PREFILLED_SYRINGE | INTRAMUSCULAR | Status: DC
  Administered 2021-06-07 – 2021-06-10 (×4): 40 mg via SUBCUTANEOUS
  Filled 2021-06-07 (×4): qty 0.4

## 2021-06-07 MED ORDER — MIDAZOLAM HCL 2 MG/2ML IJ SOLN
INTRAMUSCULAR | Status: AC
  Filled 2021-06-07: qty 4

## 2021-06-07 MED ORDER — SODIUM CHLORIDE 0.9 % IV SOLN
1.0000 g | Freq: Three times a day (TID) | INTRAVENOUS | Status: AC
  Administered 2021-06-07 – 2021-06-10 (×11): 1 g via INTRAVENOUS
  Filled 2021-06-07 (×4): qty 1
  Filled 2021-06-07: qty 20
  Filled 2021-06-07: qty 1
  Filled 2021-06-07 (×4): qty 20
  Filled 2021-06-07: qty 1

## 2021-06-07 MED ORDER — FENTANYL CITRATE (PF) 100 MCG/2ML IJ SOLN
INTRAMUSCULAR | Status: AC
  Filled 2021-06-07: qty 2

## 2021-06-07 MED ORDER — ENSURE ENLIVE PO LIQD
237.0000 mL | Freq: Two times a day (BID) | ORAL | Status: DC
  Administered 2021-06-07 – 2021-06-09 (×4): 237 mL via ORAL

## 2021-06-07 NOTE — Plan of Care (Signed)
Pt AAOx4, intermittent severe LLQ abdominal pain. Plan for IR today for drain placement. Bed is in lowest position, call light within reach. Will continue to monitor.

## 2021-06-07 NOTE — Consult Note (Signed)
Chief Complaint: Patient was seen in consultation today for diverticulitis with abscess at the request of Benjamine Sprague, DO  Referring Physician(s): Benjamine Sprague, DO  Supervising Physician: Michaelle Birks  Patient Status: [facility] - In-pt  History of Present Illness: Brianna Briggs is a 71 y.o. female with PMHx of right sided breast cancer s/p mastectomy, single kidney, squamous cell of skin and diverticulitis in 06/2019 managed with percutaneous drain. The patient presented to ED with complaints of 2 week LLQ abdominal pain and nausea, labs with leukocytosis and CT imaging with findings of recurrent acute sigmoid diverticulitis with adjacent fluid collection. The patient has been seen by surgical team and IR received request for consult.   The patient states her abdominal pain is currently controlled with pain medication. The patient denies any current chest pain or shortness of breath. She denies any current blood thinner use. The patient denies any history of sleep apnea or chronic oxygen use. She has no known complications to sedation.   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    Past Surgical History:  Procedure Laterality Date   ABDOMINAL HYSTERECTOMY     COLONOSCOPY WITH PROPOFOL N/A 05/01/2019   Procedure: COLONOSCOPY WITH PROPOFOL;  Surgeon: Toledo, Benay Pike, MD;  Location: [facility] ENDOSCOPY;  Service: Gastroenterology;  Laterality: N/A;   ESOPHAGOGASTRODUODENOSCOPY (EGD) WITH PROPOFOL N/A 05/01/2019   Procedure: ESOPHAGOGASTRODUODENOSCOPY (EGD) WITH PROPOFOL;  Surgeon: Toledo, Benay Pike, MD;  Location: [facility] ENDOSCOPY;  Service: Gastroenterology;  Laterality: N/A;   MASTECTOMY     SHOULDER ARTHROSCOPY WITH SUBACROMIAL DECOMPRESSION,  ROTATOR CUFF REPAIR AND BICEP TENDON REPAIR Left 01/13/2008   TONSILLECTOMY     TUBAL LIGATION      Allergies: Amoxicillin-pot clavulanate, Biaxin [clarithromycin], Oxycodone, Codeine, and Nitrofurantoin  Medications: Prior to Admission medications   Medication Sig Start Date End Date Taking? Authorizing Provider  Cholecalciferol 25 MCG (1000 UT) capsule Take 1,000 Units by mouth daily. 04/27/21  Yes [provider]  Coenzyme Q10 100 MG capsule Take 100 mg by mouth 2 (two) times daily. 04/27/21  Yes [provider]  dicyclomine (BENTYL) 10 MG capsule Take 10 mg by mouth 4 (four) times daily. 05/31/21  Yes [provider]  moxifloxacin (AVELOX) 400 MG tablet Take 400 mg by mouth daily. 06/03/21  Yes [provider]  nystatin (MYCOSTATIN/NYSTOP) powder Apply topically 2 (two) times daily. 05/13/21  Yes [provider]  omeprazole (PRILOSEC) 20 MG capsule Take 1 capsule (20 mg total) by mouth 2 (two) times daily before a meal. Patient taking differently: Take 20 mg by mouth 2 (two) times daily as needed. 07/04/19  Yes Tylene Fantasia, PA-C  predniSONE (DELTASONE) 5 MG tablet Take 5 mg by mouth daily. 05/27/21  Yes [provider]  rosuvastatin (CRESTOR) 10 MG tablet Take 10 mg by mouth 3 (three) times a week. 04/28/21  Yes [provider]  VENTOLIN HFA 108 (90 Base) MCG/ACT inhaler Inhale 2 puffs into the lungs 2 (two) times daily. 04/12/21  Yes [provider]  fluticasone (FLONASE) 50 MCG/ACT nasal spray Place 2 sprays into both nostrils daily as needed.  Patient not taking: Reported on 09/09/2019 05/02/19   [provider]  Family History  Problem Relation Age of Onset   Cancer Father    Kidney failure Mother     Social History   Socioeconomic History   Marital status: Married    Spouse name: Not on file   Number of children: Not on file   Years of education: Not on file   Highest education level: Not on  file  Occupational History   Not on file  Tobacco Use   Smoking status: Every Day    Packs/day: 1.50    Years: 40.00    Pack years: 60.00    Types: Cigarettes   Smokeless tobacco: Never  Vaping Use   Vaping Use: Never used  Substance and Sexual Activity   Alcohol use: No   Drug use: No   Sexual activity: Yes    Partners: Male  Other Topics Concern   Not on file  Social History Narrative   Lives at home with husband   Social Determinants of Health   Financial Resource Strain: Not on file  Food Insecurity: Not on file  Transportation Needs: Not on file  Physical Activity: Not on file  Stress: Not on file  Social Connections: Not on file   Review of Systems: A 12 point ROS discussed and pertinent positives are indicated in the HPI above.  All other systems are negative.  Review of Systems  Vital Signs: BP (!) 90/51 (BP Location: Left Arm)   Pulse (!) 109   Temp 99.6 F (37.6 C)   Resp 19   Ht '5\' 2"'$  (1.575 m)   Wt 126 lb (57.2 kg)   SpO2 100%   BMI 23.05 kg/m   Physical Exam Constitutional:      Appearance: She is ill-appearing.  HENT:     Head: Normocephalic and atraumatic.  Cardiovascular:     Rate and Rhythm: Regular rhythm. Tachycardia present.  Pulmonary:     Effort: Pulmonary effort is normal. No respiratory distress.  Abdominal:     General: There is no distension.     Palpations: Abdomen is soft.  Neurological:     Mental Status: She is alert and oriented to person, place, and time.    Imaging: CT ABDOMEN PELVIS W CONTRAST  Result Date: 06/06/2021 CLINICAL DATA:  Abdominal pain and nausea.  Leukocytosis. EXAM: CT ABDOMEN AND PELVIS WITH CONTRAST TECHNIQUE: Multidetector CT imaging of the abdomen and pelvis was performed using the standard protocol following bolus administration of intravenous contrast. RADIATION DOSE REDUCTION: This exam was performed according to the departmental dose-optimization program which includes automated exposure  control, adjustment of the mA and/or kV according to patient size and/or use of iterative reconstruction technique. CONTRAST:  131m OMNIPAQUE IOHEXOL 300 MG/ML  SOLN COMPARISON:  CT abdomen pelvis dated July 21, 2019. FINDINGS: Lower chest: No acute abnormality. Hepatobiliary: No focal liver abnormality is seen. No gallstones, gallbladder wall thickening, or biliary dilatation. Pancreas: Unremarkable. No pancreatic ductal dilatation or surrounding inflammatory changes. Spleen: Normal in size without focal abnormality. Adrenals/Urinary Tract: The adrenal glands are unremarkable. Similar severely atrophied right kidney with cystic changes. Unchanged multifocal left renal scarring and small simple cysts. No follow-up imaging is recommended. No renal calculi or hydronephrosis. The bladder is unremarkable. Stomach/Bowel: The stomach is within normal limits. Left-sided colonic diverticulosis. Severe wall thickening of the proximal sigmoid colon with prominent surrounding inflammatory changes and adjacent 4.8 x 2.4 cm abscess (series 2, image 55). No extraluminal air. The small bowel is unremarkable. Normal appendix. Vascular/Lymphatic: Aortic atherosclerosis. No enlarged  abdominal or pelvic lymph nodes. Reproductive: Status post hysterectomy. No adnexal masses. Other: No free fluid or pneumoperitoneum. Musculoskeletal: No acute or significant osseous findings. IMPRESSION: 1. Recurrent acute sigmoid diverticulitis with adjacent 4.8 x 2.4 cm abscess. No extraluminal air. 2. Aortic Atherosclerosis (ICD10-I70.0). Electronically Signed   By: Titus Dubin M.D.   On: 06/06/2021 16:57    Labs:  CBC: Recent Labs    04/28/21 2134 06/06/21 1535 06/07/21 0402  WBC 9.1 15.7* 13.3*  HGB 16.8* 16.8* 14.6  HCT 50.9* 50.8* 43.4  PLT 190 247 217    COAGS: Recent Labs    06/07/21 0402  INR 1.2    BMP: Recent Labs    04/28/21 2134 06/06/21 1535 06/07/21 0402  NA 138 133* 135  K 3.9 3.7 3.6  CL 100 91* 96*   CO2 '29 30 28  '$ GLUCOSE 105* 119* 92  BUN '12 12 11  '$ CALCIUM 9.2 9.2 8.5*  CREATININE 0.74 0.92 0.76  GFRNONAA >60 >60 >60    LIVER FUNCTION TESTS: Recent Labs    04/28/21 2134 06/06/21 1535  BILITOT 0.4 1.1  AST 18 21  ALT 13 17  ALKPHOS 73 75  PROT 7.8 8.0  ALBUMIN 3.7 3.4*   Assessment and Plan: This is a 71 year old female with PMHx of right sided breast cancer s/p mastectomy, single kidney, squamous cell of skin and diverticulitis in 06/2019 managed with percutaneous drain. The patient presented to ED with complaints of 2 week LLQ abdominal pain and nausea, labs with leukocytosis and CT imaging with findings of recurrent acute sigmoid diverticulitis with adjacent fluid collection. The patient has been seen by surgical team and IR received request for consult.   The patient has been NPO, no blood thinners taken, imaging, labs and vitals have been reviewed.  Risks and benefits discussed with the patient including bleeding, infection, damage to adjacent structures, bowel perforation/fistula connection, and sepsis.  All of the patient's questions were answered, patient is agreeable to proceed. Consent signed and in chart.   Thank you for this interesting consult.  I greatly enjoyed meeting Darilyn Storbeck and look forward to participating in their care.  A copy of this report was sent to the requesting provider on this date.  Electronically Signed: Hedy Jacob, PA-C 06/07/2021, 10:11 AM   I spent a total of 20 Minutes in face to face in clinical consultation, greater than 50% of which was counseling/coordinating care for recurrent diverticulitis with abscess.

## 2021-06-07 NOTE — Progress Notes (Signed)
Subjective:  CC: Brianna Briggs is a 71 y.o. female  Hospital stay day 1,   diverticulitis  HPI: No acute issues overnight.  S/p drain placement. thirsty  ROS:  General: Denies weight loss, weight gain, fatigue, fevers, chills, and night sweats. Heart: Denies chest pain, palpitations, racing heart, irregular heartbeat, leg pain or swelling, and decreased activity tolerance. Respiratory: Denies breathing difficulty, shortness of breath, wheezing, cough, and sputum. GI: Denies change in appetite, heartburn, nausea, vomiting, constipation, diarrhea, and blood in stool. GU: Denies difficulty urinating, pain with urinating, urgency, frequency, blood in urine.   Objective:   Temp:  [98.2 F (36.8 C)-99.6 F (37.6 C)] 99.6 F (37.6 C) (05/23 0759) Pulse Rate:  [97-121] 97 (05/23 1105) Resp:  [16-29] 17 (05/23 1105) BP: (74-112)/(51-73) 83/53 (05/23 1105) SpO2:  [86 %-100 %] 98 % (05/23 1105) Weight:  [57.2 kg] 57.2 kg (05/22 1524)     Height: '5\' 2"'$  (157.5 cm) Weight: 57.2 kg BMI (Calculated): 23.04   Intake/Output this shift:   Intake/Output Summary (Last 24 hours) at 06/07/2021 1514 Last data filed at 06/07/2021 1400 Gross per 24 hour  Intake 305 ml  Output --  Net 305 ml    Constitutional :  alert, cooperative, appears stated age, and no distress  Respiratory:  clear to auscultation bilaterally  Cardiovascular:  regular rate and rhythm  Gastrointestinal: Soft, no guarding, focal TTP LLQ  drain with sersanguinous drainage .   Skin: Cool and moist.   Psychiatric: Normal affect, non-agitated, not confused       LABS:     Latest Ref Rng & Units 06/07/2021    4:02 AM 06/06/2021    3:35 PM 04/28/2021    9:34 PM  CMP  Glucose 70 - 99 mg/dL 92   119   105    BUN 8 - 23 mg/dL '11   12   12    '$ Creatinine 0.44 - 1.00 mg/dL 0.76   0.92   0.74    Sodium 135 - 145 mmol/L 135   133   138    Potassium 3.5 - 5.1 mmol/L 3.6   3.7   3.9    Chloride 98 - 111 mmol/L 96   91   100    CO2  22 - 32 mmol/L '28   30   29    '$ Calcium 8.9 - 10.3 mg/dL 8.5   9.2   9.2    Total Protein 6.5 - 8.1 g/dL  8.0   7.8    Total Bilirubin 0.3 - 1.2 mg/dL  1.1   0.4    Alkaline Phos 38 - 126 U/L  75   73    AST 15 - 41 U/L  21   18    ALT 0 - 44 U/L  17   13        Latest Ref Rng & Units 06/07/2021    4:02 AM 06/06/2021    3:35 PM 04/28/2021    9:34 PM  CBC  WBC 4.0 - 10.5 K/uL 13.3   15.7   9.1    Hemoglobin 12.0 - 15.0 g/dL 14.6   16.8   16.8    Hematocrit 36.0 - 46.0 % 43.4   50.8   50.9    Platelets 150 - 400 K/uL 217   247   190      RADS: INDICATION: Perforated diverticulitis with pericolonic abscess.   EXAM: CT GUIDED LEFT LOWER QUADRANT ABSCESS DRAINAGE CATHETER PLACEMENT.  RADIATION DOSE REDUCTION: This exam was performed according to the departmental dose-optimization program which includes automated exposure control, adjustment of the mA and/or kV according to patient size and/or use of iterative reconstruction technique.   COMPARISON:  CT AP, 06/06/2021.   MEDICATIONS: The patient is currently admitted to the hospital and receiving intravenous antibiotics. The antibiotics were administered within an appropriate time frame prior to the initiation of the procedure.   ANESTHESIA/SEDATION: Local anesthetic was administered.   The patient was continuously monitored during the procedure by the interventional radiology nurse under my direct supervision.   CONTRAST:  None   COMPLICATIONS: None immediate.   PROCEDURE: Informed written consent was obtained from the patient after a discussion of the risks, benefits and alternatives to treatment. The patient was placed supine on the CT gantry and a pre procedural CT was performed re-demonstrating the known abscess/fluid collection within the LEFT lower quadrant. The procedure was planned. A timeout was performed prior to the initiation of the procedure.   The LEFT lower quadrant was prepped and draped in the usual  sterile fashion. The overlying soft tissues were anesthetized with 1% lidocaine with epinephrine. Appropriate trajectory was planned with the use of a 22 gauge spinal needle. An 18 gauge trocar needle was advanced into the abscess/fluid collection and a short Amplatz super stiff wire was coiled within the collection. Appropriate positioning was confirmed with a limited CT scan. The tract was serially dilated allowing placement of a 10 Fr drainage catheter. Appropriate positioning was confirmed with a limited postprocedural CT scan.   15 mL of purulent fluid was aspirated. The tube was connected to a bulb suction and sutured in place. A dressing was placed. The patient tolerated the procedure well without immediate post procedural complication.   IMPRESSION: Successful CT guided placement of a 10 Fr drainage catheter into the LEFT lower quadrant abscess with aspiration of 25 mL of purulent fluid.   Samples were sent to the laboratory as requested by the ordering clinical team.   Michaelle Birks, MD   [facility]   [facility]     Electronically Signed   By: Michaelle Birks M.D.   On: 06/07/2021 12:05 Assessment:   Diverticulitis- recurrent with abscess formation.  S/p drain placement today.  Resume clears, continue abx for infection, monitor wbc and pain.    labs/images/medications/previous chart entries reviewed personally and relevant changes/updates noted above.

## 2021-06-07 NOTE — Procedures (Signed)
[facility] Procedure Note  Patient: Brianna Briggs DOB: [birth year] Medical Record Number: [id number] Note Date/Time: 06/07/21 11:46 AM   Performing Physician: Michaelle Birks, MD Assistant(s): None  Diagnosis: Perforated diverticulitis w abscess.  Procedure: DRAINAGE CATHETER PLACEMENT OF LLQ ABSCESS  Anesthesia: Local Anesthetic Complications: None Estimated Blood Loss: Minimal Specimens: Sent for Gram Stain, Aerobe Culture, and Anerobe Culture  Findings:  Successful CT-guided placement of 10 F catheter into LLQ abscess.  Plan:  - Flush drain with 5 mL Normal Saline every 8 hours. - Follow up drain evaluation / sinogram in 2 week(s).  See detailed procedure note with images in PACS. The patient tolerated the procedure well without incident or complication and was returned to Floor Bed in stable condition.    Michaelle Birks, MD [facility]   Pager. [place]

## 2021-06-08 ENCOUNTER — Inpatient Hospital Stay: Payer: Medicare Other

## 2021-06-08 LAB — CBC
HCT: 37.8 % (ref 36.0–46.0)
Hemoglobin: 12 g/dL (ref 12.0–15.0)
MCH: 29.3 pg (ref 26.0–34.0)
MCHC: 31.7 g/dL (ref 30.0–36.0)
MCV: 92.4 fL (ref 80.0–100.0)
Platelets: 206 10*3/uL (ref 150–400)
RBC: 4.09 MIL/uL (ref 3.87–5.11)
RDW: 12.8 % (ref 11.5–15.5)
WBC: 8.8 10*3/uL (ref 4.0–10.5)
nRBC: 0 % (ref 0.0–0.2)

## 2021-06-08 LAB — URINALYSIS, ROUTINE W REFLEX MICROSCOPIC
Bacteria, UA: NONE SEEN
Bilirubin Urine: NEGATIVE
Glucose, UA: NEGATIVE mg/dL
Ketones, ur: 5 mg/dL — AB
Leukocytes,Ua: NEGATIVE
Nitrite: NEGATIVE
Protein, ur: NEGATIVE mg/dL
Specific Gravity, Urine: 1.012 (ref 1.005–1.030)
pH: 5 (ref 5.0–8.0)

## 2021-06-08 LAB — BASIC METABOLIC PANEL
Anion gap: 7 (ref 5–15)
BUN: 8 mg/dL (ref 8–23)
CO2: 28 mmol/L (ref 22–32)
Calcium: 8.1 mg/dL — ABNORMAL LOW (ref 8.9–10.3)
Chloride: 100 mmol/L (ref 98–111)
Creatinine, Ser: 0.63 mg/dL (ref 0.44–1.00)
GFR, Estimated: >60 mL/min (ref >60)
Glucose, Bld: 84 mg/dL (ref 70–99)
Potassium: 4 mmol/L (ref 3.5–5.1)
Sodium: 135 mmol/L (ref 135–145)

## 2021-06-08 LAB — SURGICAL PCR SCREEN
MRSA, PCR: NEGATIVE
Staphylococcus aureus: NEGATIVE

## 2021-06-08 MED ORDER — MUPIROCIN 2 % EX OINT
1.0000 "application " | TOPICAL_OINTMENT | Freq: Two times a day (BID) | CUTANEOUS | Status: DC
  Administered 2021-06-08: 1 via NASAL
  Filled 2021-06-08: qty 22

## 2021-06-08 NOTE — Progress Notes (Signed)
Subjective:  CC: Brianna Briggs is a 71 y.o. female  Hospital stay day 2,   diverticulitis  HPI: Low BP asymptomatic.  S/p bolus and increased fluid rate.  Still complains of pain in LLQ and drain site, but no worsening. Some clear liquid diet intake.  Some urine discoloration reported.  ROS:  General: Denies weight loss, weight gain, fatigue, fevers, chills, and night sweats. Heart: Denies chest pain, palpitations, racing heart, irregular heartbeat, leg pain or swelling, and decreased activity tolerance. Respiratory: Denies breathing difficulty, shortness of breath, wheezing, cough, and sputum. GI: Denies change in appetite, heartburn, nausea, vomiting, constipation, diarrhea, and blood in stool. GU: Denies difficulty urinating, pain with urinating, urgency, frequency, blood in urine.   Objective:   Temp:  [98 F (36.7 C)-98.9 F (37.2 C)] 98 F (36.7 C) (05/24 0818) Pulse Rate:  [86-100] 100 (05/24 0818) Resp:  [16-20] 18 (05/24 0818) BP: (77-108)/(52-72) 108/52 (05/24 0818) SpO2:  [91 %-100 %] 94 % (05/24 0818)     Height: '5\' 2"'$  (157.5 cm) Weight: 57.2 kg BMI (Calculated): 23.04   Intake/Output this shift:   Intake/Output Summary (Last 24 hours) at 06/08/2021 1517 Last data filed at 06/08/2021 1424 Gross per 24 hour  Intake 3775.45 ml  Output 740 ml  Net 3035.45 ml    Constitutional :  alert, cooperative, appears stated age, and no distress  Respiratory:  clear to auscultation bilaterally  Cardiovascular:  regular rate and rhythm  Gastrointestinal: Soft, no guarding, focal TTP LLQ  drain with sersanguinous drainage , unchanged from previous exam.  Remaining quadrants  asymptomatic.  Skin: Cool and moist.   Psychiatric: Normal affect, non-agitated, not confused       LABS:     Latest Ref Rng & Units 06/08/2021    9:14 AM 06/07/2021    4:02 AM 06/06/2021    3:35 PM  CMP  Glucose 70 - 99 mg/dL 84   92   119    BUN 8 - 23 mg/dL '8   11   12    '$ Creatinine 0.44 -  1.00 mg/dL 0.63   0.76   0.92    Sodium 135 - 145 mmol/L 135   135   133    Potassium 3.5 - 5.1 mmol/L 4.0   3.6   3.7    Chloride 98 - 111 mmol/L 100   96   91    CO2 22 - 32 mmol/L '28   28   30    '$ Calcium 8.9 - 10.3 mg/dL 8.1   8.5   9.2    Total Protein 6.5 - 8.1 g/dL   8.0    Total Bilirubin 0.3 - 1.2 mg/dL   1.1    Alkaline Phos 38 - 126 U/L   75    AST 15 - 41 U/L   21    ALT 0 - 44 U/L   17        Latest Ref Rng & Units 06/08/2021    3:37 AM 06/07/2021    6:14 PM 06/07/2021    4:02 AM  CBC  WBC 4.0 - 10.5 K/uL 8.8   12.6   13.3    Hemoglobin 12.0 - 15.0 g/dL 12.0   13.2   14.6    Hematocrit 36.0 - 46.0 % 37.8   40.5   43.4    Platelets 150 - 400 K/uL 206   229   217      RADS: CLINICAL DATA:  Cough.  EXAM: CHEST  1 VIEW   COMPARISON:  Chest x-ray April 28, 2021.   FINDINGS: Chronic hyperinflation. Mild prominence of the lung markings, similar to prior. No consolidation. Cardiomediastinal silhouette is within normal limits and unchanged. No displaced fracture.   IMPRESSION: Chronic hyperinflation, suggestive of COPD/emphysema. No evidence of acute cardiopulmonary disease.     Electronically Signed   By: Margaretha Sheffield M.D.   On: 06/08/2021 08:46   Assessment:   Diverticulitis- recurrent with abscess formation.  S/p drain placement pain overall unchanged but leukocytosis has resolved.  Continue clears for another day. Continue abx for course. Will slowly advance diet as pain improves.  Noted some coughing in am during exam, as well urine changes reported above.  U/A and BMP ordered with no acute issues.  BP more WNL today, so will drop fluid rate back down.  Continue for another day.    labs/images/medications/previous chart entries reviewed personally and relevant changes/updates noted above.

## 2021-06-08 NOTE — Progress Notes (Signed)
Referring Physician(s): Benjamine Sprague, DO  Supervising Physician: Juliet Rude  Patient Status:  Brianna Briggs - In-pt  Reason for visit: Diverticulitis with perforation and abscess S/p Successful drain placed in IR 5/23  Subjective: Patient states overall she feels okay, just weak and not wanting any of the liquid diet options, she denies any N/V, fever or chills and denies any issues at drain site.  Allergies: Amoxicillin-pot clavulanate, Biaxin [clarithromycin], Oxycodone, Codeine, and Nitrofurantoin  Medications: Prior to Admission medications   Medication Sig Start Date End Date Taking? Authorizing Provider  Cholecalciferol 25 MCG (1000 UT) capsule Take 1,000 Units by mouth daily. 04/27/21  Yes [provider]  Coenzyme Q10 100 MG capsule Take 100 mg by mouth 2 (two) times daily. 04/27/21  Yes [provider]  dicyclomine (BENTYL) 10 MG capsule Take 10 mg by mouth 4 (four) times daily. 05/31/21  Yes [provider]  moxifloxacin (AVELOX) 400 MG tablet Take 400 mg by mouth daily. 06/03/21  Yes [provider]  nystatin (MYCOSTATIN/NYSTOP) powder Apply topically 2 (two) times daily. 05/13/21  Yes [provider]  omeprazole (PRILOSEC) 20 MG capsule Take 1 capsule (20 mg total) by mouth 2 (two) times daily before a meal. Patient taking differently: Take 20 mg by mouth 2 (two) times daily as needed. 07/04/19  Yes Tylene Fantasia, PA-C  predniSONE (DELTASONE) 5 MG tablet Take 5 mg by mouth daily. 05/27/21  Yes [provider]  rosuvastatin (CRESTOR) 10 MG tablet Take 10 mg by mouth 3 (three) times a week. 04/28/21  Yes [provider]  VENTOLIN HFA 108 (90 Base) MCG/ACT inhaler Inhale 2 puffs into the lungs 2 (two) times daily. 04/12/21  Yes [provider]  fluticasone (FLONASE) 50 MCG/ACT nasal spray Place 2 sprays into both nostrils daily as needed.  Patient not taking: Reported on 09/09/2019 05/02/19   [provider]   Vital Signs: BP (!) 108/52 (BP Location: Left Arm)   Pulse 100   Temp 98 F (36.7 C) (Oral)   Resp 18   Ht '5\' 2"'$  (1.575 m)   Wt 126 lb (57.2 kg)   SpO2 94%   BMI 23.05 kg/m   Physical Exam  General: A&O, NAD Abd: Soft, NT, ND, LLQ drain C/D/I 5 ml serosanguinous output in JP  Output by Drain (mL) 06/06/21 0701 - 06/06/21 1900 06/06/21 1901 - 06/07/21 0700 06/07/21 0701 - 06/07/21 1900 06/07/21 1901 - 06/08/21 0700 06/08/21 0701 - 06/08/21 1115  Open Drain 1 Left;Ventral Hip   25 15    Imaging: DG Chest 1 View  Result Date: 06/08/2021 CLINICAL DATA:  Cough. EXAM: CHEST  1 VIEW COMPARISON:  Chest x-ray April 28, 2021. FINDINGS: Chronic hyperinflation. Mild prominence of the lung markings, similar to prior. No consolidation. Cardiomediastinal silhouette is within normal limits and unchanged. No displaced fracture. IMPRESSION: Chronic hyperinflation, suggestive of COPD/emphysema. No evidence of acute cardiopulmonary disease. Electronically Signed   By: Margaretha Sheffield M.D.   On: 06/08/2021 08:46   CT ABDOMEN PELVIS W CONTRAST  Result Date: 06/06/2021 CLINICAL DATA:  Abdominal pain and nausea.  Leukocytosis. EXAM: CT ABDOMEN AND PELVIS WITH CONTRAST TECHNIQUE: Multidetector CT imaging of the abdomen and pelvis was performed using the standard protocol following bolus administration of intravenous contrast. RADIATION DOSE REDUCTION: This exam was performed according to the departmental dose-optimization program which includes automated exposure control, adjustment of the mA and/or kV according to patient size and/or use of iterative reconstruction technique. CONTRAST:  150m OMNIPAQUE IOHEXOL 300 MG/ML  SOLN COMPARISON:  CT abdomen pelvis dated July 21, 2019. FINDINGS: Lower chest: No acute abnormality. Hepatobiliary: No focal liver abnormality is seen. No gallstones, gallbladder wall thickening, or biliary dilatation. Pancreas: Unremarkable. No pancreatic ductal dilatation  or surrounding inflammatory changes. Spleen: Normal in size without focal abnormality. Adrenals/Urinary Tract: The adrenal glands are unremarkable. Similar severely atrophied right kidney with cystic changes. Unchanged multifocal left renal scarring and small simple cysts. No follow-up imaging is recommended. No renal calculi or hydronephrosis. The bladder is unremarkable. Stomach/Bowel: The stomach is within normal limits. Left-sided colonic diverticulosis. Severe wall thickening of the proximal sigmoid colon with prominent surrounding inflammatory changes and adjacent 4.8 x 2.4 cm abscess (series 2, image 55). No extraluminal air. The small bowel is unremarkable. Normal appendix. Vascular/Lymphatic: Aortic atherosclerosis. No enlarged abdominal or pelvic lymph nodes. Reproductive: Status post hysterectomy. No adnexal masses. Other: No free fluid or pneumoperitoneum. Musculoskeletal: No acute or significant osseous findings. IMPRESSION: 1. Recurrent acute sigmoid diverticulitis with adjacent 4.8 x 2.4 cm abscess. No extraluminal air. 2. Aortic Atherosclerosis (ICD10-I70.0). Electronically Signed   By: WTitus DubinM.D.   On: 06/06/2021 16:57   CT IMAGE GUIDED DRAINAGE BY PERCUTANEOUS CATHETER  Result Date: 06/07/2021 INDICATION: Perforated diverticulitis with pericolonic abscess. EXAM: CT GUIDED LEFT LOWER QUADRANT ABSCESS DRAINAGE CATHETER PLACEMENT. RADIATION DOSE REDUCTION: This exam was performed according to the departmental dose-optimization program which includes automated exposure control, adjustment of the mA and/or kV according to patient size and/or use of iterative reconstruction technique. COMPARISON:  CT AP, 06/06/2021. MEDICATIONS: The patient is currently admitted to the hospital and receiving intravenous antibiotics. The antibiotics were administered within an appropriate time frame prior to the initiation of the procedure. ANESTHESIA/SEDATION: Local anesthetic was administered. The patient  was continuously monitored during the procedure by the interventional radiology nurse under my direct supervision. CONTRAST:  None COMPLICATIONS: None immediate. PROCEDURE: Informed written consent was obtained from the patient after a discussion of the risks, benefits and alternatives to treatment. The patient was placed supine on the CT gantry and a pre procedural CT was performed re-demonstrating the known abscess/fluid collection within the LEFT lower quadrant. The procedure was planned. A timeout was performed prior to the initiation of the procedure. The LEFT lower quadrant was prepped and draped in the usual sterile fashion. The overlying soft tissues were anesthetized with 1% lidocaine with epinephrine. Appropriate trajectory was planned with the use of a 22 gauge spinal needle. An 18 gauge trocar needle was advanced into the abscess/fluid collection and a short Amplatz super stiff wire was coiled within the collection. Appropriate positioning was confirmed with a limited CT scan. The tract was serially dilated allowing placement of a 10 Fr drainage catheter. Appropriate positioning was confirmed with a limited postprocedural CT scan. 15 mL of purulent fluid was aspirated. The tube was connected to a bulb suction and sutured in place. A dressing was placed. The patient tolerated the procedure well without immediate post procedural complication. IMPRESSION: Successful CT guided placement of a 10 Fr drainage catheter into the LEFT lower quadrant abscess with aspiration of 25 mL of purulent fluid. Samples were sent to the laboratory as requested by the ordering clinical team. JMichaelle Birks MD [facility] Electronically Signed   By: JMichaelle BirksM.D.   On: 06/07/2021 12:05    Labs:  CBC: Recent Labs    06/06/21 1535 06/07/21 0402 06/07/21 1814 06/08/21 0337  WBC 15.7* 13.3* 12.6*  8.8  HGB 16.8* 14.6 13.2 12.0  HCT 50.8* 43.4 40.5 37.8  PLT  247 217 229 206    COAGS: Recent Labs    06/07/21 0402  INR 1.2    BMP: Recent Labs    04/28/21 2134 06/06/21 1535 06/07/21 0402 06/08/21 0914  NA 138 133* 135 135  K 3.9 3.7 3.6 4.0  CL 100 91* 96* 100  CO2 '29 30 28 28  '$ GLUCOSE 105* 119* 92 84  BUN '12 12 11 8  '$ CALCIUM 9.2 9.2 8.5* 8.1*  CREATININE 0.74 0.92 0.76 0.63  GFRNONAA >60 >60 >60 >60    LIVER FUNCTION TESTS: Recent Labs    04/28/21 2134 06/06/21 1535  BILITOT 0.4 1.1  AST 18 21  ALT 13 17  ALKPHOS 73 75  PROT 7.8 8.0  ALBUMIN 3.7 3.4*    Assessment and Plan: Diverticulitis with perforation and abscess, on IV Meropenem  S/p Successful drain placed in IR 5/23, wbc trending down and wnl, afebrile, 40 cc/24 hrs, Cx show growth but pending  If patient is clinically improving, afebrile and catheter drainage remains <10-15 cc/48 hrs and is non-feculent, consider catheter removal.   If patient has worsening clinical symptoms, persistently febrile or has increased or consistently high drainage output, it may be necessary to repeat CT imaging to evaluate for residual abscess or fistula formation.   Drainage catheter has retained suture within catheter, prior to removal of drain cut off the hub to release the retention suture forming the pigtail.  Plan: Continue saline flushes with 5 cc NS. Record output Q shift. Dressing changes QD or PRN if soiled.  Call IR APP or on call IR MD if difficulty flushing or sudden change in drain output.  Repeat imaging/possible drain injection once output < 10 mL/QD (excluding flush material.)  Discharge planning: Please contact IR APP or on call IR MD prior to patient d/c to ensure appropriate follow up plans are in place. Typically patient will follow up with IR clinic 10-14 days post d/c for repeat imaging/possible drain injection. IR scheduler will contact patient with date/time of appointment. Patient will need to flush drain QD with 5 cc NS, record output QD, dressing  changes every 2-3 days or earlier if soiled.   IR will continue to follow - please call with questions or concerns.   Electronically Signed: Hedy Jacob, PA-C 06/08/2021, 11:12 AM   I spent a total of 15 Minutes at the the patient's bedside AND on the patient's hospital floor or unit, greater than 50% of which was counseling/coordinating care for diverticulitis with perforation and abscess.

## 2021-06-08 NOTE — TOC Initial Note (Signed)
Transition of Care [facility]) - Initial/Assessment Note    Patient Details  Name: Brianna Briggs MRN: [id number] Date of Birth: May 06, 1950  [facility]) CM/SW Contact:    Beverly Sessions, RN Phone Number: 06/08/2021, 12:12 PM  Clinical Narrative:                   Transition of Care [facility]) Screening Note   Patient Details  Name: Brianna Briggs Date of Birth: Oct 05, 1950   [facility]) CM/SW Contact:    Beverly Sessions, RN Phone Number: 06/08/2021, 12:12 PM    Transition of Care Department [facility]) has reviewed patient and no TOC needs have been identified at this time. We will continue to monitor patient advancement through interdisciplinary progression rounds. If new patient transition needs arise, please place a TOC consult.         Patient Goals and CMS Choice        Expected Discharge Plan and Services                                                Prior Living Arrangements/Services                       Activities of Daily Living Home Assistive Devices/Equipment: None ADL Screening (condition at time of admission) Patient's cognitive ability adequate to safely complete daily activities?: Yes Is the patient deaf or have difficulty hearing?: No Does the patient have difficulty seeing, even when wearing glasses/contacts?: No Does the patient have difficulty concentrating, remembering, or making decisions?: No Patient able to express need for assistance with ADLs?: Yes Does the patient have difficulty dressing or bathing?: No Independently performs ADLs?: Yes (appropriate for developmental age) Does the patient have difficulty walking or climbing stairs?: No Weakness of Legs: None Weakness of Arms/Hands: None  Permission Sought/Granted                  Emotional Assessment              Admission diagnosis:  Diverticulitis large intestine [K57.32] Patient Active Problem List   Diagnosis Date  Noted   Diverticulitis large intestine 06/06/2021   Diverticulitis of large intestine with abscess without bleeding 07/17/2019   Chronic eczema 08/30/2015   Health care maintenance 03/01/2015   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

## 2021-06-09 DIAGNOSIS — K5720 Diverticulitis of large intestine with perforation and abscess without bleeding: Principal | ICD-10-CM

## 2021-06-09 LAB — CBC
HCT: 36.6 % (ref 36.0–46.0)
Hemoglobin: 11.8 g/dL — ABNORMAL LOW (ref 12.0–15.0)
MCH: 30.1 pg (ref 26.0–34.0)
MCHC: 32.2 g/dL (ref 30.0–36.0)
MCV: 93.4 fL (ref 80.0–100.0)
Platelets: 193 10*3/uL (ref 150–400)
RBC: 3.92 MIL/uL (ref 3.87–5.11)
RDW: 12.6 % (ref 11.5–15.5)
WBC: 6.7 10*3/uL (ref 4.0–10.5)
nRBC: 0 % (ref 0.0–0.2)

## 2021-06-09 MED ORDER — SODIUM CHLORIDE 0.9 % IV SOLN: 1.0000 g | INTRAVENOUS | Status: DC

## 2021-06-09 MED ORDER — ROSUVASTATIN CALCIUM 10 MG PO TABS
10.0000 mg | ORAL_TABLET | ORAL | Status: DC
  Administered 2021-06-10: 10 mg via ORAL
  Filled 2021-06-09: qty 1

## 2021-06-09 MED ORDER — SODIUM CHLORIDE 0.9 % IV BOLUS
500.0000 mL | Freq: Once | INTRAVENOUS | Status: AC
  Administered 2021-06-09: 500 mL via INTRAVENOUS

## 2021-06-09 NOTE — Care Management Important Message (Signed)
Important Message  Patient Details  Name: Brianna Briggs MRN: [id number] Date of Birth: 10/20/1950   Medicare Important Message Given:  Yes     [name] 06/09/2021, 11:34 AM

## 2021-06-09 NOTE — Consult Note (Signed)
NAME: Brianna Briggs  DOB: 02-Nov-1950  MRN: [id number]  Date/Time: 06/09/2021 2:23 PM  REQUESTING PROVIDER: Dr. Lysle Pearl Subjective:  REASON FOR CONSULT: Acute diverticulitis with peridiverticular abscess ? Dakisha Schoof is a 71 y.o. with a history of eczema, right breast cancer status postmastectomy, diverticulitis Was admitted on 06/06/2021 with abdominal pain on the left side for the past 2 weeks. She has been seeing her GI physician at the [facility] and was given antibiotics to take which was ciprofloxacin that made her very nauseous, abdominal pain and vomiting. In the ED on 06/06/2021 BP 98/73, temperature 98.9, heart rate 106, sats 91%.  WBC 15.7, Hb 16.8, creatinine 0.92. CT abdomen showed recurrent acute sigmoid diverticulitis with adjacent 4.8 into 2.4 cm abscess.  She was started on IV meropenem.  She was seen by surgeon and was recommended IR place a drain which was done on 06/07/2021.  Culture from the fluid was Proteus so far I am asked to see the patient for antibiotic management Patient is feeling better She had acute diverticulitis in 2021.  And received IV meropenem at home.  Following which she  did not go for interval surgery.   Past Medical History:  Diagnosis Date   Allergic genetic state    Breast cancer, right ([place]) 1996   Mastectomy   History of chicken pox    History of colon polyps    Hyperlipidemia    Single kidney    Squamous cell carcinoma of skin 01/27/2019   Right inferior lateral thigh. KA-type    Past Surgical History:  Procedure Laterality Date   ABDOMINAL HYSTERECTOMY     COLONOSCOPY WITH PROPOFOL N/A 05/01/2019   Procedure: COLONOSCOPY WITH PROPOFOL;  Surgeon: Toledo, Benay Pike, MD;  Location: [facility] ENDOSCOPY;  Service: Gastroenterology;  Laterality: N/A;   ESOPHAGOGASTRODUODENOSCOPY (EGD) WITH PROPOFOL N/A 05/01/2019   Procedure: ESOPHAGOGASTRODUODENOSCOPY (EGD) WITH PROPOFOL;  Surgeon: Toledo, Benay Pike, MD;  Location: [facility] ENDOSCOPY;   Service: Gastroenterology;  Laterality: N/A;   MASTECTOMY     SHOULDER ARTHROSCOPY WITH SUBACROMIAL DECOMPRESSION, ROTATOR CUFF REPAIR AND BICEP TENDON REPAIR Left 01/13/2008   TONSILLECTOMY     TUBAL LIGATION      Social History   Socioeconomic History   Marital status: Married    Spouse name: Not on file   Number of children: Not on file   Years of education: Not on file   Highest education level: Not on file  Occupational History   Not on file  Tobacco Use   Smoking status: Every Day    Packs/day: 1.50    Years: 40.00    Pack years: 60.00    Types: Cigarettes   Smokeless tobacco: Never  Vaping Use   Vaping Use: Never used  Substance and Sexual Activity   Alcohol use: No   Drug use: No   Sexual activity: Yes    Partners: Male  Other Topics Concern   Not on file  Social History Narrative   Lives at home with husband   Social Determinants of Health   Financial Resource Strain: Not on file  Food Insecurity: Not on file  Transportation Needs: Not on file  Physical Activity: Not on file  Stress: Not on file  Social Connections: Not on file  Intimate Partner Violence: Not on file    Family History  Problem Relation Age of Onset   Cancer Father    Kidney failure Mother    Allergies  Allergen Reactions   Amoxicillin-Pot Clavulanate Diarrhea and Rash  Other reaction(s): Unknown   Biaxin [Clarithromycin] Diarrhea    Other reaction(s): Unknown   Oxycodone Itching   Codeine Rash   Nitrofurantoin Rash   I? Current Facility-Administered Medications  Medication Dose Route Frequency Provider Last Rate Last Admin   celecoxib (CELEBREX) capsule 200 mg  200 mg Oral Daily Sakai, Isami, DO   200 mg at 06/09/21 0830   enoxaparin (LOVENOX) injection 40 mg  40 mg Subcutaneous Q24H Sakai, Isami, DO   40 mg at 06/08/21 2059   meropenem (MERREM) 1 g in sodium chloride 0.9 % 100 mL IVPB  1 g Intravenous Q8H Dolan, Carissa E, RPH 200 mL/hr at 06/09/21 1332 1 g at 06/09/21 1332    mometasone (ELOCON) 0.1 % cream 1 application.  1 application. Topical Once per day on Sun Tue Thu Fri Sat Sakai, Isami, DO   1 application. at 06/09/21 0831   morphine (PF) 2 MG/ML injection 2 mg  2 mg Intravenous Q4H PRN Lysle Pearl, Isami, DO       ondansetron (ZOFRAN-ODT) disintegrating tablet 4 mg  4 mg Oral Q6H PRN Lysle Pearl, Isami, DO       Or   ondansetron [facility]) injection 4 mg  4 mg Intravenous Q6H PRN Lysle Pearl, Isami, DO   4 mg at 06/07/21 2051   [START ON 06/10/2021] rosuvastatin (CRESTOR) tablet 10 mg  10 mg Oral Once per day on Mon Wed Fri Sakai, Isami, DO       sodium chloride flush (NS) 0.9 % injection 5 mL  5 mL Intracatheter Q8H Mugweru, Jon, MD   5 mL at 06/09/21 1332   traMADol (ULTRAM) tablet 50 mg  50 mg Oral Q6H PRN Lysle Pearl, Isami, DO   50 mg at 06/08/21 1946     Abtx:  Anti-infectives (From admission, onward)    Start     Dose/Rate Route Frequency Ordered Stop   06/07/21 1400  meropenem (MERREM) 1 g in sodium chloride 0.9 % 100 mL IVPB        1 g 200 mL/hr over 30 Minutes Intravenous Every 8 hours 06/07/21 1005     06/06/21 2200  meropenem (MERREM) 1 g in sodium chloride 0.9 % 100 mL IVPB  Status:  Discontinued        1 g 200 mL/hr over 30 Minutes Intravenous Every 12 hours 06/06/21 1824 06/07/21 1005       REVIEW OF SYSTEMS:  Const: negative fever, negative chills, negative weight loss Eyes: negative diplopia or visual changes, negative eye pain ENT: negative coryza, negative sore throat Resp: negative cough, hemoptysis, dyspnea Cards: negative for chest pain, palpitations, lower extremity edema GU: negative for frequency, dysuria and hematuria GI: Abdominal pain on the left side.   Skin: negative for rash and pruritus Heme: negative for easy bruising and gum/nose bleeding MS: negative for myalgias, arthralgias, back pain and muscle weakness Neurolo:negative for headaches, dizziness, vertigo, memory problems  Psych: negative for feelings of anxiety, depression   Endocrine: negative for thyroid, diabetes Allergy/Immunology-allergies as listed above Objective:  VITALS:  BP 96/63 (BP Location: Left Arm)   Pulse 81   Temp 98.5 F (36.9 C) (Oral)   Resp 16   Ht '5\' 2"'$  (1.575 m)   Wt 57.2 kg   SpO2 97%   BMI 23.05 kg/m  LDA Left lower quadrant drain PHYSICAL EXAM:  General: Alert, cooperative, no distress, appears stated age.  Head: Normocephalic, without obvious abnormality, atraumatic. Eyes: Conjunctivae clear, anicteric sclerae. Pupils are equal ENT Nares normal. No drainage  or sinus tenderness. Lips, mucosa, and tongue normal. No Thrush Neck: Supple, symmetrical, no adenopathy, thyroid: non tender no carotid bruit and no JVD. Back: No CVA tenderness. Lungs: Clear to auscultation bilaterally. No Wheezing or Rhonchi. No rales. Heart: Regular rate and rhythm, no murmur, rub or gallop. Abdomen: Soft.  Left lower quadrant drain.  Blood-tinged purulent fluid in the drain  extremities: atraumatic, no cyanosis. No edema. No clubbing Skin: Dry skin Lymph: Cervical, supraclavicular normal. Neurologic: Grossly non-focal Pertinent Labs Lab Results CBC    Component Value Date/Time   WBC 6.7 06/09/2021 0418   RBC 3.92 06/09/2021 0418   HGB 11.8 (L) 06/09/2021 0418   HCT 36.6 06/09/2021 0418   PLT 193 06/09/2021 0418   MCV 93.4 06/09/2021 0418   MCH 30.1 06/09/2021 0418   MCHC 32.2 06/09/2021 0418   RDW 12.6 06/09/2021 0418   LYMPHSABS 2.6 04/28/2021 2134   MONOABS 0.7 04/28/2021 2134   EOSABS 0.2 04/28/2021 2134   BASOSABS 0.1 04/28/2021 2134       Latest Ref Rng & Units 06/08/2021    9:14 AM 06/07/2021    4:02 AM 06/06/2021    3:35 PM  CMP  Glucose 70 - 99 mg/dL 84   92   119    BUN 8 - 23 mg/dL '8   11   12    '$ Creatinine 0.44 - 1.00 mg/dL 0.63   0.76   0.92    Sodium 135 - 145 mmol/L 135   135   133    Potassium 3.5 - 5.1 mmol/L 4.0   3.6   3.7    Chloride 98 - 111 mmol/L 100   96   91    CO2 22 - 32 mmol/L '28   28   30     '$ Calcium 8.9 - 10.3 mg/dL 8.1   8.5   9.2    Total Protein 6.5 - 8.1 g/dL   8.0    Total Bilirubin 0.3 - 1.2 mg/dL   1.1    Alkaline Phos 38 - 126 U/L   75    AST 15 - 41 U/L   21    ALT 0 - 44 U/L   17        Microbiology: Recent Results (from the past 240 hour(s))  Blood culture (routine x 2)     Status: None (Preliminary result)   Collection Time: 06/06/21  3:40 PM   Specimen: Left Antecubital; Blood  Result Value Ref Range Status   Specimen Description LEFT ANTECUBITAL  Final   Special Requests   Final    BOTTLES DRAWN AEROBIC AND ANAEROBIC Blood Culture adequate volume   Culture   Final    NO GROWTH 3 DAYS Performed at [facility], [address], [place], [place] [address]    Report Status PENDING  Incomplete  Blood culture (routine x 2)     Status: None (Preliminary result)   Collection Time: 06/06/21 11:57 PM   Specimen: BLOOD  Result Value Ref Range Status   Specimen Description BLOOD RIGHT HAND  Final   Special Requests   Final    BOTTLES DRAWN AEROBIC AND ANAEROBIC Blood Culture results may not be optimal due to an excessive volume of blood received in culture bottles   Culture   Final    NO GROWTH 2 DAYS Performed at [facility], [address] [place], [place] [address]    Report Status PENDING  Incomplete  Aerobic/Anaerobic Culture w Gram  Stain (surgical/deep wound)     Status: None (Preliminary result)   Collection Time: 06/07/21 11:46 AM   Specimen: Abscess  Result Value Ref Range Status   Specimen Description   Final    ABSCESS Performed at [facility], [address], [place], [place] [address]    Special Requests   Final    NONE Performed at [facility], [address] [place], [place] [address]    Gram Stain   Final    ABUNDANT WBC PRESENT, PREDOMINANTLY PMN FEW GRAM POSITIVE COCCI MODERATE GRAM NEGATIVE RODS    Culture   Final    FEW PROTEUS MIRABILIS HOLDING FOR POSSIBLE ANAEROBE Performed at  [facility], [place] [address] [place], [place] [address]    Report Status PENDING  Incomplete   Organism ID, Bacteria PROTEUS MIRABILIS  Final      Susceptibility   Proteus mirabilis - MIC*    AMPICILLIN <=2 SENSITIVE Sensitive     CEFAZOLIN <=4 SENSITIVE Sensitive     CEFEPIME <=0.12 SENSITIVE Sensitive     CEFTAZIDIME <=1 SENSITIVE Sensitive     CEFTRIAXONE <=0.25 SENSITIVE Sensitive     CIPROFLOXACIN <=0.25 SENSITIVE Sensitive     GENTAMICIN <=1 SENSITIVE Sensitive     IMIPENEM 2 SENSITIVE Sensitive     TRIMETH/SULFA <=20 SENSITIVE Sensitive     AMPICILLIN/SULBACTAM <=2 SENSITIVE Sensitive     PIP/TAZO <=4 SENSITIVE Sensitive     * FEW PROTEUS MIRABILIS  Surgical PCR screen     Status: None   Collection Time: 06/08/21  9:15 PM   Specimen: Nasal Mucosa; Nasal Swab  Result Value Ref Range Status   MRSA, PCR NEGATIVE NEGATIVE Final   Staphylococcus aureus NEGATIVE NEGATIVE Final    Comment: (NOTE) The Xpert SA Assay (FDA approved for NASAL specimens in patients 75 years of age and older), is one component of a comprehensive surveillance program. It is not intended to diagnose infection nor to guide or monitor treatment. Performed at [facility], [address] [place], [place] [address]     IMAGING RESULTS:   I have personally reviewed the films Acute sigmoid diverticulitis with adjacent 4.8 cm into 2.4 cm abscess.  No extraluminal air.  Impression/Recommendation Acute sigmoid diverticulitis with peridiverticular abscess.  Status post CT-guided drain placement So far Proteus and culture.  But will not de-escalate to cefazolin as we need to cover for anaerobes and Enterococcus.  Patient is currently on meropenem.  She states she cannot tolerate p.o. Cipro, amoxicillin/clavulanate or Flagyl. She is going to need IV meropenem for 10 days.  Current smoker   History of CA breast with right  mastectomy. ? ___________________________________________________ Discussed with patient, requesting provider Note:  This document was prepared using Dragon voice recognition software and may include unintentional dictation errors.

## 2021-06-09 NOTE — Progress Notes (Signed)
Subjective:  CC: Brianna Briggs is a 71 y.o. female  Hospital stay day 3,   diverticulitis  HPI: Low BP asymptomatic again.  S/p bolus. Still complains of pain in LLQ and drain site,slight improvement. Some clear liquid diet intake. More output in drain.  ROS:  General: Denies weight loss, weight gain, fatigue, fevers, chills, and night sweats. Heart: Denies chest pain, palpitations, racing heart, irregular heartbeat, leg pain or swelling, and decreased activity tolerance. Respiratory: Denies breathing difficulty, shortness of breath, wheezing, cough, and sputum. GI: Denies change in appetite, heartburn, nausea, vomiting, constipation, diarrhea, and blood in stool. GU: Denies difficulty urinating, pain with urinating, urgency, frequency, blood in urine.   Objective:   Temp:  [98.5 F (36.9 C)-98.6 F (37 C)] 98.5 F (36.9 C) (05/25 0722) Pulse Rate:  [81-91] 81 (05/25 0722) Resp:  [16-17] 16 (05/25 0722) BP: (86-96)/(52-63) 96/63 (05/25 0722) SpO2:  [88 %-97 %] 97 % (05/25 0722)     Height: '5\' 2"'$  (157.5 cm) Weight: 57.2 kg BMI (Calculated): 23.04   Intake/Output this shift:   Intake/Output Summary (Last 24 hours) at 06/09/2021 1140 Last data filed at 06/08/2021 2342 Gross per 24 hour  Intake 120 ml  Output 600 ml  Net -480 ml    Constitutional :  alert, cooperative, appears stated age, and no distress  Respiratory:  clear to auscultation bilaterally  Cardiovascular:  regular rate and rhythm  Gastrointestinal: Soft, no guarding, focal TTP LLQ  drain with more purulent drainage today .  Remaining quadrants  asymptomatic.  Skin: Cool and moist.   Psychiatric: Normal affect, non-agitated, not confused       LABS:     Latest Ref Rng & Units 06/08/2021    9:14 AM 06/07/2021    4:02 AM 06/06/2021    3:35 PM  CMP  Glucose 70 - 99 mg/dL 84   92   119    BUN 8 - 23 mg/dL '8   11   12    '$ Creatinine 0.44 - 1.00 mg/dL 0.63   0.76   0.92    Sodium 135 - 145 mmol/L 135   135    133    Potassium 3.5 - 5.1 mmol/L 4.0   3.6   3.7    Chloride 98 - 111 mmol/L 100   96   91    CO2 22 - 32 mmol/L '28   28   30    '$ Calcium 8.9 - 10.3 mg/dL 8.1   8.5   9.2    Total Protein 6.5 - 8.1 g/dL   8.0    Total Bilirubin 0.3 - 1.2 mg/dL   1.1    Alkaline Phos 38 - 126 U/L   75    AST 15 - 41 U/L   21    ALT 0 - 44 U/L   17        Latest Ref Rng & Units 06/09/2021    4:18 AM 06/08/2021    3:37 AM 06/07/2021    6:14 PM  CBC  WBC 4.0 - 10.5 K/uL 6.7   8.8   12.6    Hemoglobin 12.0 - 15.0 g/dL 11.8   12.0   13.2    Hematocrit 36.0 - 46.0 % 36.6   37.8   40.5    Platelets 150 - 400 K/uL 193   206   229      RADS: N/a   Assessment:   Diverticulitis- recurrent with abscess formation.  S/p drain  placement pain overall unchanged but leukocytosis has resolved.  Drain with more purulent drainage today but not emptied this am.  Pain improving so will advance to full liquid Continue abx for course.  Will ask ID consult per patient request due to issue with abx outpt management in the past.    labs/images/medications/previous chart entries reviewed personally and relevant changes/updates noted above.

## 2021-06-10 ENCOUNTER — Inpatient Hospital Stay: Payer: Self-pay

## 2021-06-10 LAB — CBC
HCT: 38.9 % (ref 36.0–46.0)
Hemoglobin: 12.6 g/dL (ref 12.0–15.0)
MCH: 29.6 pg (ref 26.0–34.0)
MCHC: 32.4 g/dL (ref 30.0–36.0)
MCV: 91.3 fL (ref 80.0–100.0)
Platelets: 224 10*3/uL (ref 150–400)
RBC: 4.26 MIL/uL (ref 3.87–5.11)
RDW: 12.5 % (ref 11.5–15.5)
WBC: 6.6 10*3/uL (ref 4.0–10.5)
nRBC: 0 % (ref 0.0–0.2)

## 2021-06-10 LAB — AEROBIC/ANAEROBIC CULTURE W GRAM STAIN (SURGICAL/DEEP WOUND)

## 2021-06-10 MED ORDER — SODIUM CHLORIDE 0.9 % IV SOLN
1.0000 g | INTRAVENOUS | Status: DC
  Administered 2021-06-11: 1000 mg via INTRAVENOUS
  Filled 2021-06-10: qty 1

## 2021-06-10 MED ORDER — SODIUM CHLORIDE 0.9% FLUSH
10.0000 mL | Freq: Two times a day (BID) | INTRAVENOUS | Status: DC
  Administered 2021-06-10 – 2021-06-11 (×2): 10 mL

## 2021-06-10 MED ORDER — SODIUM CHLORIDE 0.9% FLUSH: 10.0000 mL | INTRAVENOUS | Status: DC | PRN

## 2021-06-10 NOTE — Progress Notes (Signed)
Mobility Specialist - Progress Note   06/10/21 1200  Mobility  Activity Ambulated with assistance in hallway;Ambulated with assistance to bathroom  Level of Assistance Modified independent, requires aide device or extra time  Assistive Device None  Distance Ambulated (ft) 200 ft  Activity Response Tolerated well  $Mobility charge 1 Mobility     Pre-mobility: 81 HR, 85/60 BP, 90% SpO2 During mobility: 102 HR, 84-88% SpO2 Post-mobility: 75 HR, 85/75 BP, 92% SpO2   Pt lying in bed upon arrival, utilizing RA. BP checked pre/post ambulation, recorded above. Pt ambulated to bathroom prior to continuation into hallway. Denied dizziness initially, but later reports "head feeling kind've funny". Also stated post-ambulation that "when I was walking it kind've felt like I had tunnel vision, like it was dark in my peripheral vision". RN notified. Pt returned to bed with needs in reach, alarm set.    Kathee Delton Mobility Specialist 06/10/21, 12:58 PM

## 2021-06-10 NOTE — Progress Notes (Signed)
Subjective:  CC: Brianna Briggs is a 71 y.o. female  Hospital stay day 4,   diverticulitis  HPI: No acute issues.  Tolerating full liquid, passing flatus, no BM.  ROS:  General: Denies weight loss, weight gain, fatigue, fevers, chills, and night sweats. Heart: Denies chest pain, palpitations, racing heart, irregular heartbeat, leg pain or swelling, and decreased activity tolerance. Respiratory: Denies breathing difficulty, shortness of breath, wheezing, cough, and sputum. GI: Denies change in appetite, heartburn, nausea, vomiting, constipation, diarrhea, and blood in stool. GU: Denies difficulty urinating, pain with urinating, urgency, frequency, blood in urine.   Objective:   Temp:  [97.8 F (36.6 C)-98 F (36.7 C)] 98 F (36.7 C) (05/26 1434) Pulse Rate:  [66-81] 66 (05/26 1434) Resp:  [18-20] 18 (05/26 0822) BP: (89-102)/(60-71) 102/61 (05/26 1434) SpO2:  [92 %-96 %] 95 % (05/26 1434)     Height: '5\' 2"'$  (157.5 cm) Weight: 57.2 kg BMI (Calculated): 23.04   Intake/Output this shift:   Intake/Output Summary (Last 24 hours) at 06/10/2021 1532 Last data filed at 06/10/2021 1434 Gross per 24 hour  Intake 1059 ml  Output 50 ml  Net 1009 ml    Constitutional :  alert, cooperative, appears stated age, and no distress  Respiratory:  clear to auscultation bilaterally  Cardiovascular:  regular rate and rhythm  Gastrointestinal: Soft, no guarding, focal TTP LLQ, improving again.  drain with more serous drainage today .  Remaining quadrants  asymptomatic.  Skin: Cool and moist.   Psychiatric: Normal affect, non-agitated, not confused       LABS:     Latest Ref Rng & Units 06/08/2021    9:14 AM 06/07/2021    4:02 AM 06/06/2021    3:35 PM  CMP  Glucose 70 - 99 mg/dL 84   92   119    BUN 8 - 23 mg/dL '8   11   12    '$ Creatinine 0.44 - 1.00 mg/dL 0.63   0.76   0.92    Sodium 135 - 145 mmol/L 135   135   133    Potassium 3.5 - 5.1 mmol/L 4.0   3.6   3.7    Chloride 98 - 111 mmol/L  100   96   91    CO2 22 - 32 mmol/L '28   28   30    '$ Calcium 8.9 - 10.3 mg/dL 8.1   8.5   9.2    Total Protein 6.5 - 8.1 g/dL   8.0    Total Bilirubin 0.3 - 1.2 mg/dL   1.1    Alkaline Phos 38 - 126 U/L   75    AST 15 - 41 U/L   21    ALT 0 - 44 U/L   17        Latest Ref Rng & Units 06/10/2021    4:16 AM 06/09/2021    4:18 AM 06/08/2021    3:37 AM  CBC  WBC 4.0 - 10.5 K/uL 6.6   6.7   8.8    Hemoglobin 12.0 - 15.0 g/dL 12.6   11.8   12.0    Hematocrit 36.0 - 46.0 % 38.9   36.6   37.8    Platelets 150 - 400 K/uL 224   193   206      RADS: N/a   Assessment:   Diverticulitis- recurrent with abscess formation.  S/p drain placement pain.  Improving and tolerating full liquid diet so we will advance  to regular diet.  Hopefully will have a BM prior to discharge.  Appreciate ID Recs.  Midline IV placed and will go home to complete IV antibiotic course hopefully tomorrow. labs/images/medications/previous chart entries reviewed personally and relevant changes/updates noted above.

## 2021-06-10 NOTE — Progress Notes (Signed)
Patient is hesitant to PICC insertion but agreeable to Midline.MD ok'd the Midline.Patient antibiotic is for 10 days.

## 2021-06-10 NOTE — Treatment Plan (Signed)
Diagnosis: Diverticular abscess Sigmoid Diverticulitis Baseline Creatinine < 1    Allergies  Allergen Reactions   Amoxicillin-Pot Clavulanate Diarrhea and Rash    Other reaction(s): Unknown   Biaxin [Clarithromycin] Diarrhea    Other reaction(s): Unknown   Oxycodone Itching   Codeine Rash   Nitrofurantoin Rash    OPAT Orders Ertapenem 1 gram IV every 24 hours   End Date: 06/20/21  Midline Care Per Protocol:  Labs weekly while on IV antibiotics: _X_ CBC with differential  _X_ CMP    _X_ Please pull PIC at completion of IV antibiotics   Fax weekly labs to [facility]  [phone]  Clinic Follow Up Appt: 06/16/21 at 10.45Am   Call [phone] twith any questions or critical values

## 2021-06-10 NOTE — Progress Notes (Signed)
  PHARMACY CONSULT NOTE FOR:  ertapenem  OUTPATIENT  PARENTERAL ANTIBIOTIC THERAPY (OPAT)  Indication: diverticular abscess Regimen: ertapenem 1g IV q24h End date: 06/20/21  IV antibiotic discharge orders are pended. To discharging provider:  please sign these orders via discharge navigator,  Select New Orders & click on the button choice - Manage This Unsigned Work.     Thank you for allowing pharmacy to be a part of this patient's care.  Candie Mile 06/10/2021, 2:48 PM

## 2021-06-10 NOTE — TOC Initial Note (Signed)
Transition of Care [facility]) - Initial/Assessment Note    Patient Details  Name: Brianna Briggs MRN: [id number] Date of Birth: [birth year]  [facility] CM/SW Contact:    Beverly Sessions, RN Phone Number: 06/10/2021, 2:03 PM  Clinical Narrative:                 Admitted for: Diverticulitis Admitted from: Home with husband  PCP [place]: Denies issues obtaining home medications    Patient will require Iv antibiotics at discharge. Midline to be placed today.  Referral made to [facility] with [facility].  Pam to provide education at the bedside today.  Patient prefers [facility] home health however they do not have nursing availability at this time.  Patient states she does not have a preference since [facility] is not available.  Referral made to [facility] with [facility]    Expected Discharge Plan: [place] Barriers to Discharge: Continued Medical Work up   Patient Goals and CMS Choice        Expected Discharge Plan and Services Expected Discharge Plan: [place]       Living arrangements for the past 2 months: Single Family Home                           HH Arranged: RN [facility] Agency: [place] Date HH Agency Contacted: 06/10/21   Representative spoke with at [facility]: Corene Cornea  Prior Living Arrangements/Services Living arrangements for the past 2 months: [place] Lives with:: Spouse Patient language and need for interpreter reviewed:: Yes Do you feel safe going back to the place where you live?: Yes      Need for Family Participation in Patient Care: Yes (Comment) Care giver support system in place?: Yes (comment)   Criminal Activity/Legal Involvement Pertinent to Current Situation/Hospitalization: No - Comment as needed  Activities of Daily Living Home Assistive Devices/Equipment: None ADL Screening (condition at time of admission) Patient's cognitive ability adequate to  safely complete daily activities?: Yes Is the patient deaf or have difficulty hearing?: No Does the patient have difficulty seeing, even when wearing glasses/contacts?: No Does the patient have difficulty concentrating, remembering, or making decisions?: No Patient able to express need for assistance with ADLs?: Yes Does the patient have difficulty dressing or bathing?: No Independently performs ADLs?: Yes (appropriate for developmental age) Does the patient have difficulty walking or climbing stairs?: No Weakness of Legs: None Weakness of Arms/Hands: None  Permission Sought/Granted                  Emotional Assessment       Orientation: : Oriented to Self, Oriented to Place, Oriented to  Time, Oriented to Situation Alcohol / Substance Use: Not Applicable Psych Involvement: No (comment)  Admission diagnosis:  Diverticulitis large intestine [K57.32] Patient Active Problem List   Diagnosis Date Noted   Diverticulitis large intestine 06/06/2021   Diverticulitis of large intestine with abscess without bleeding 07/17/2019   Chronic eczema 08/30/2015   Health care maintenance 03/01/2015   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: [phone]: [phone]  Social Determinants of Health (SDOH) Interventions    Readmission Risk Interventions     View : No data to display.

## 2021-06-10 NOTE — Progress Notes (Signed)
   Date of Admission:  06/06/2021    ID: Brianna Briggs is a 71 y.o. female  Principal Problem:   Diverticulitis large intestine    Subjective: Pt is tired Not had a bowel movt yet No fever No abdominal pain  Medications:   celecoxib  200 mg Oral Daily   enoxaparin (LOVENOX) injection  40 mg Subcutaneous Q24H   mometasone  1 application. Topical Once per day on Sun Tue Thu Fri Sat   rosuvastatin  10 mg Oral Once per day on Mon Wed Fri   sodium chloride flush  10-40 mL Intracatheter Q12H   sodium chloride flush  5 mL Intracatheter Q8H    Objective: Vital signs in last 24 hours: Temp:  [97.8 F (36.6 C)-98 F (36.7 C)] 98 F (36.7 C) (05/26 1434) Pulse Rate:  [66-81] 66 (05/26 1434) Resp:  [16-20] 18 (05/26 0822) BP: (89-102)/(58-71) 102/61 (05/26 1434) SpO2:  [92 %-96 %] 95 % (05/26 1434)  LDA Left lower quadrant drain PHYSICAL EXAM:  General: Alert, cooperative, no distress, appears stated age.  Head: Normocephalic, without obvious abnormality, atraumatic. Eyes: Conjunctivae clear, anicteric sclerae. Pupils are equal ENT Nares normal. No drainage or sinus tenderness. Lips, mucosa, and tongue normal. No Thrush Neck: Supple, symmetrical, no adenopathy, thyroid: non tender no carotid bruit and no JVD. Back: No CVA tenderness. Lungs: Clear to auscultation bilaterally. No Wheezing or Rhonchi. No rales. Heart: Regular rate and rhythm, no murmur, rub or gallop. Abdomen: Soft, left lower quadrant drain Extremities: atraumatic, no cyanosis. No edema. No clubbing Skin: No rashes or lesions. Or bruising Lymph: Cervical, supraclavicular normal. Neurologic: Grossly non-focal  Lab Results Recent Labs    06/08/21 0914 06/09/21 0418 06/10/21 0416  WBC  --  6.7 6.6  HGB  --  11.8* 12.6  HCT  --  36.6 38.9  NA 135  --   --   K 4.0  --   --   CL 100  --   --   CO2 28  --   --   BUN 8  --   --   CREATININE 0.63  --   --     Microbiology: Abscess culture  proteus bacteroides Studies/Results: [place] EKG SITE RITE  Result Date: 06/10/2021 If Site Rite image not attached, placement could not be confirmed due to current cardiac rhythm.    Assessment/Plan:  Acute sigmoid diverticulitis with peri diverticular abscess S/p drain placement Currently on meropenem On discharge will be switched to ertapenem until 06/20/21 Pt will be followed in my clinic as OP Discussed the management with patient and Dr.Sakai ID will sign off- call if needed

## 2021-06-11 ENCOUNTER — Inpatient Hospital Stay: Payer: Medicare Other

## 2021-06-11 LAB — CBC
HCT: 41 % (ref 36.0–46.0)
Hemoglobin: 13.2 g/dL (ref 12.0–15.0)
MCH: 29.2 pg (ref 26.0–34.0)
MCHC: 32.2 g/dL (ref 30.0–36.0)
MCV: 90.7 fL (ref 80.0–100.0)
Platelets: 273 10*3/uL (ref 150–400)
RBC: 4.52 MIL/uL (ref 3.87–5.11)
RDW: 12.5 % (ref 11.5–15.5)
WBC: 5.7 10*3/uL (ref 4.0–10.5)
nRBC: 0 % (ref 0.0–0.2)

## 2021-06-11 LAB — CULTURE, BLOOD (ROUTINE X 2)
Culture: NO GROWTH
Special Requests: ADEQUATE

## 2021-06-11 MED ORDER — TRAMADOL HCL 50 MG PO TABS: 50.0000 mg | ORAL_TABLET | Freq: Four times a day (QID) | ORAL | 0 refills | Status: DC | PRN

## 2021-06-11 MED ORDER — ERTAPENEM IV (FOR PTA / DISCHARGE USE ONLY): 1.0000 g | INTRAVENOUS | 0 refills | Status: AC

## 2021-06-11 MED ORDER — SODIUM CHLORIDE 0.9% FLUSH: 5.0000 mL | Freq: Three times a day (TID) | INTRAVENOUS | 0 refills | Status: AC

## 2021-06-11 MED ORDER — ACETAMINOPHEN 325 MG PO TABS: 650.0000 mg | ORAL_TABLET | Freq: Three times a day (TID) | ORAL | 0 refills | Status: AC | PRN

## 2021-06-11 NOTE — Discharge Instructions (Signed)
Patient will need to flush drain THREE TIMES A DAY with 5 cc NS, record output DAILY, dressing changes every 2-3 days or earlier if soiled. OK TO SHOWER  CALL INFECTIOUS DISEASE OFFICE WITH QUESTIONS REGARDING IV OR ANTIBIOTIC CONCERNS  CALL RADIOLOGY OFFICE FOR ANY QUESTIONS REGARDING THE DRAIN AND ASSOICATED CARE  SURGERY OFFICE WILL BE AVAILABLE TO CLARIFY ANY ADDITIONAL QUESTIONS OR CONCERNS IF NEEDED

## 2021-06-11 NOTE — TOC Transition Note (Signed)
Transition of Care [facility]) - CM/SW Discharge Note   Patient Details  Name: Brianna Briggs MRN: [id number] Date of Birth: [birth year]  Transition of Care [facility]) CM/SW Contact:  Izola Price, RN Phone Number: 06/11/2021, 1:16 PM   Clinical Narrative:  Patient has discharge orders in. [facility] and IV Abx infusions arranged 5/26 via [place] and IV infusion via Carolynn Sayers, which was confirmed by [place] No this am. Husband to transport to home per CM notes. Unit RN educated on drain flushes for home care. Simmie Davies RN CM     Final next level of care: [place] Barriers to Discharge: Barriers Resolved   Patient Goals and CMS Choice        Discharge Placement                    Patient and family notified of of transfer: 06/11/21  Discharge Plan and Services                DME Arranged: N/A DME Agency: NA       HH Arranged: RN [place] Agency: [place] Date [place]: 06/10/21   Representative spoke with at [place]: Corene Cornea  Social Determinants of Health ([place]) Interventions     Readmission Risk Interventions     View : No data to display.

## 2021-06-11 NOTE — Plan of Care (Signed)

## 2021-06-12 ENCOUNTER — Other Ambulatory Visit: Payer: Self-pay | Admitting: Physician Assistant

## 2021-06-12 DIAGNOSIS — K5720 Diverticulitis of large intestine with perforation and abscess without bleeding: Secondary | ICD-10-CM

## 2021-06-12 LAB — CULTURE, BLOOD (ROUTINE X 2): Culture: NO GROWTH

## 2021-06-12 NOTE — Discharge Summary (Signed)
Physician Discharge Summary  Patient ID: Brianna Briggs MRN: [id number] DOB/AGE: 07-31-1950 71 y.o.  Admit date: 06/06/2021 Discharge date: 06/11/21  Admission Diagnoses: recurrent diverticulitis with absces  Discharge Diagnoses:  Same as above  Discharged Condition: good  Hospital Course: admitted for above. Underwent repeat IR guided drainage.  Post procedure, slowly recovered with gradual decrease in pain and wbc while on invanz.  Due to complicated abx course last admission, ID consulted and recommended completing outpt abx with midline IV.  [place] arranged. At time of d/c, tolerating diet and pain controlled, IV abx and IR drain care setup and education completed.  Will continue to f/u with all services as outpt, with discussion of interval colon resection a few months later.  Consults: ID and IR  Discharge Exam: Blood pressure 115/67, pulse 72, temperature 97.9 F (36.6 C), temperature source Oral, resp. rate 18, height '5\' 2"'  (1.575 m), weight 57.2 kg, SpO2 91 %. General appearance: alert, cooperative, and no distress GI: soft, no guarding, focal TTP around drain insertion site, no sign of erythema or displacement.  Drain with serous fluid at time of discharge  Disposition:  Discharge disposition: 01-Home or Self Care       Discharge Instructions     [facility] pharmacist to adjust dose for Vancomycin, Aminoglycosides and other anti-infective therapies as requested by physician.   Complete by: As directed    [facility] to provide Cath Flo 67m   Complete by: As directed    Administer for PICC line occlusion and as ordered by physician for other access device issues.   Anaphylaxis Kit: Provided to treat any anaphylactic reaction to the medication being provided to the patient if First Dose or when requested by physician   Complete by: As directed    Epinephrine 137mml vial / amp: Administer 0.67m22m0.67ml42mubcutaneously once for moderate to severe  anaphylaxis, nurse to call physician and pharmacy when reaction occurs and call 911 if needed for immediate care   Diphenhydramine 50mg567mIV vial: Administer 25-50mg 11mM PRN for first dose reaction, rash, itching, mild reaction, nurse to call physician and pharmacy when reaction occurs   Sodium Chloride 0.9% NS 500ml I50mdminister if needed for hypovolemic blood pressure drop or as ordered by physician after call to physician with anaphylactic reaction   Change dressing on IV access line weekly and PRN   Complete by: As directed    Flush IV access with Sodium Chloride 0.9% and Heparin 10 units/ml or 100 units/ml   Complete by: As directed    Home infusion instructions - [facility]   Complete by: As directed    Instructions: Flush IV access with Sodium Chloride 0.9% and Heparin 10units/ml or 100units/ml   Change dressing on IV access line: Weekly and PRN   Instructions Cath Flo 2mg: Ad5mister for PICC Line occlusion and as ordered by physician for other access device   [facility] pharmacist to adjust dose for: Vancomycin, Aminoglycosides and other anti-infective therapies as requested by physician   Method of administration may be changed at the discretion of home infusion pharmacist based upon assessment of the patient and/or caregiver's ability to self-administer the medication ordered   Complete by: As directed       Allergies as of 06/11/2021       Reactions   Amoxicillin-pot Clavulanate Diarrhea, Rash   Other reaction(s): Unknown   Biaxin [clarithromycin] Diarrhea   Other reaction(s): Unknown   Oxycodone Itching   Codeine Rash  Nitrofurantoin Rash        Medication List     STOP taking these medications    fluticasone 50 MCG/ACT nasal spray Commonly known as: FLONASE   moxifloxacin 400 MG tablet Commonly known as: AVELOX   omeprazole 20 MG capsule Commonly known as: PRILOSEC   predniSONE 5 MG tablet Commonly known as: DELTASONE        TAKE these medications    acetaminophen 325 MG tablet Commonly known as: Tylenol Take 2 tablets (650 mg total) by mouth every 8 (eight) hours as needed for mild pain.   Cholecalciferol 25 MCG (1000 UT) capsule Take 1,000 Units by mouth daily.   Coenzyme Q10 100 MG capsule Take 100 mg by mouth 2 (two) times daily.   dicyclomine 10 MG capsule Commonly known as: BENTYL Take 10 mg by mouth 4 (four) times daily.   ertapenem  IVPB Commonly known as: INVANZ Inject 1 g into the vein daily for 10 days. Indication:  diverticular abscess First Dose: No Last Day of Therapy:  06/20/21 Labs - Once weekly:  CBC/D and CMP,  Method of administration: Mini-Bag Plus / Gravity Method of administration may be changed at the discretion of home infusion pharmacist based upon assessment of the patient and/or caregiver's ability to self-administer the medication ordered.   nystatin powder Commonly known as: MYCOSTATIN/NYSTOP Apply topically 2 (two) times daily.   rosuvastatin 10 MG tablet Commonly known as: CRESTOR Take 10 mg by mouth 3 (three) times a week.   sodium chloride flush 0.9 % Soln Commonly known as: NS 5 mLs by Intracatheter route every 8 (eight) hours.   traMADol 50 MG tablet Commonly known as: Ultram Take 1 tablet (50 mg total) by mouth every 6 (six) hours as needed for moderate pain or severe pain.   Ventolin HFA 108 (90 Base) MCG/ACT inhaler Generic drug: albuterol Inhale 2 puffs into the lungs 2 (two) times daily.               Discharge Care Instructions  (From admission, onward)           Start     Ordered   06/11/21 0000  Change dressing on IV access line weekly and PRN  (Home infusion instructions - [facility] )        06/11/21 1121            Follow-up [place]. Call in 2 week(s).   Why: patient will follow up for repeat imaging/possible drain injection. Contact information: [place] [address] [phone]         Tsosie Billing, MD Follow up in 1 week(s).   Specialty: Infectious Diseases Why: followup for outpatient IV antibiotic infusion for diverticulitis Contact information: [place] [address] [phone]         Benjamine Sprague, DO Follow up in 4 week(s).   Specialty: Surgery Why: followup diverticulitis to discuss possible colonoscopy and surgery Contact information: [address] [place] [address] [phone]                  Total time spent arranging discharge was >66mn. Signed: IBenjamine Sprague5/28/2023, 10:00 AM

## 2021-06-14 ENCOUNTER — Telehealth: Payer: Self-pay

## 2021-06-14 ENCOUNTER — Other Ambulatory Visit: Payer: Self-pay | Admitting: Surgery

## 2021-06-14 DIAGNOSIS — K5720 Diverticulitis of large intestine with perforation and abscess without bleeding: Secondary | ICD-10-CM

## 2021-06-14 NOTE — Telephone Encounter (Signed)
Otila Kluver with [facility] [phone]) called to let us know they were unsuccessful with getting the patients labs. Patient has a midline and they do not draw from midlines. Otila Kluver stated they would need an order to go back out tomorrow to have another nurse try again. Orders given to Otila Kluver to attempt to draw labs on the patient again tomorrow.  Dianelys Scinto T Brooks Sailors

## 2021-06-16 ENCOUNTER — Encounter: Payer: Self-pay | Admitting: Infectious Diseases

## 2021-06-16 ENCOUNTER — Other Ambulatory Visit
Admission: RE | Admit: 2021-06-16 | Discharge: 2021-06-16 | Disposition: A | Discharge status: Home / Self Care | Payer: Medicare Other | Source: Ambulatory Visit | Attending: Infectious Diseases | Admitting: Infectious Diseases

## 2021-06-16 ENCOUNTER — Ambulatory Visit: Payer: Medicare Other | Attending: Infectious Diseases | Admitting: Infectious Diseases

## 2021-06-16 VITALS — BP 131/91 | HR 102 | Temp 98.1°F | Resp 16 | Ht 62.0 in | Wt 126.0 lb

## 2021-06-16 DIAGNOSIS — B966 Bacteroides fragilis [B. fragilis] as the cause of diseases classified elsewhere: Secondary | ICD-10-CM | POA: Diagnosis not present

## 2021-06-16 DIAGNOSIS — K651 Peritoneal abscess: Secondary | ICD-10-CM | POA: Diagnosis not present

## 2021-06-16 DIAGNOSIS — Z79899 Other long term (current) drug therapy: Secondary | ICD-10-CM | POA: Diagnosis not present

## 2021-06-16 DIAGNOSIS — Z853 Personal history of malignant neoplasm of breast: Secondary | ICD-10-CM | POA: Diagnosis not present

## 2021-06-16 DIAGNOSIS — K5720 Diverticulitis of large intestine with perforation and abscess without bleeding: Secondary | ICD-10-CM | POA: Diagnosis not present

## 2021-06-16 DIAGNOSIS — F17210 Nicotine dependence, cigarettes, uncomplicated: Secondary | ICD-10-CM | POA: Diagnosis not present

## 2021-06-16 DIAGNOSIS — B964 Proteus (mirabilis) (morganii) as the cause of diseases classified elsewhere: Secondary | ICD-10-CM | POA: Insufficient documentation

## 2021-06-16 DIAGNOSIS — Z9011 Acquired absence of right breast and nipple: Secondary | ICD-10-CM | POA: Diagnosis not present

## 2021-06-16 LAB — CBC WITH DIFFERENTIAL/PLATELET
Abs Immature Granulocytes: 0.08 10*3/uL — ABNORMAL HIGH (ref 0.00–0.07)
Basophils Absolute: 0.1 10*3/uL (ref 0.0–0.1)
Basophils Relative: 1 %
Eosinophils Absolute: 0.1 10*3/uL (ref 0.0–0.5)
Eosinophils Relative: 1 %
HCT: 49.6 % — ABNORMAL HIGH (ref 36.0–46.0)
Hemoglobin: 16 g/dL — ABNORMAL HIGH (ref 12.0–15.0)
Immature Granulocytes: 1 %
Lymphocytes Relative: 13 %
Lymphs Abs: 1.5 10*3/uL (ref 0.7–4.0)
MCH: 29 pg (ref 26.0–34.0)
MCHC: 32.3 g/dL (ref 30.0–36.0)
MCV: 89.9 fL (ref 80.0–100.0)
Monocytes Absolute: 0.7 10*3/uL (ref 0.1–1.0)
Monocytes Relative: 6 %
Neutro Abs: 9.3 10*3/uL — ABNORMAL HIGH (ref 1.7–7.7)
Neutrophils Relative %: 78 %
Platelets: 338 10*3/uL (ref 150–400)
RBC: 5.52 MIL/uL — ABNORMAL HIGH (ref 3.87–5.11)
RDW: 12.9 % (ref 11.5–15.5)
WBC: 11.8 10*3/uL — ABNORMAL HIGH (ref 4.0–10.5)
nRBC: 0 % (ref 0.0–0.2)

## 2021-06-16 LAB — COMPREHENSIVE METABOLIC PANEL
ALT: 13 U/L (ref 0–44)
AST: 23 U/L (ref 15–41)
Albumin: 3.1 g/dL — ABNORMAL LOW (ref 3.5–5.0)
Alkaline Phosphatase: 77 U/L (ref 38–126)
Anion gap: 10 (ref 5–15)
BUN: 9 mg/dL (ref 8–23)
CO2: 29 mmol/L (ref 22–32)
Calcium: 9.1 mg/dL (ref 8.9–10.3)
Chloride: 98 mmol/L (ref 98–111)
Creatinine, Ser: 0.71 mg/dL (ref 0.44–1.00)
GFR, Estimated: >60 mL/min (ref >60)
Glucose, Bld: 121 mg/dL — ABNORMAL HIGH (ref 70–99)
Potassium: 3.8 mmol/L (ref 3.5–5.1)
Sodium: 137 mmol/L (ref 135–145)
Total Bilirubin: 0.5 mg/dL (ref 0.3–1.2)
Total Protein: 7.6 g/dL (ref 6.5–8.1)

## 2021-06-16 NOTE — Patient Instructions (Addendum)
You are here for follow up, today will do labs- cbc /cmp- may extend your antibiotic if needed- will also discusswith surgeon regarding your catheter

## 2021-06-16 NOTE — Progress Notes (Signed)
NAME: Brianna Briggs  DOB: 04-07-1950  MRN: [id number]  Date/Time: 06/16/2021 11:04 AM   Subjective:   ? Brianna Briggs is a 71 y.o. female with a history of right breast CEA status postmastectomy Diverticulitis Eczema Was recently hospitalized between 06/06/2021 until 06/11/2021 for recurrent diverticulitis with peridiverticular abscess.  She underwent IR guided drainage placement.  Cultures had Proteus.  She was discharged home on ertapenem for total of 2 weeks.  She is here for follow-up. She states she is got some twinges of pain in the left lower quadrant.  Also has some leaking outside. States t that there is not much draining out of the catheter He has an appointment with the surgeon next week as well as repeat CT scan next week. She will be completing IV or ertapenem on 06/20/2021. 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 KA-type    Past Surgical History:  Procedure Laterality Date   ABDOMINAL HYSTERECTOMY     COLONOSCOPY WITH PROPOFOL N/A 05/01/2019   Procedure: COLONOSCOPY WITH PROPOFOL;  Surgeon: Toledo, Benay Pike, MD;  Location: [facility] ENDOSCOPY;  Service: Gastroenterology;  Laterality: N/A;   ESOPHAGOGASTRODUODENOSCOPY (EGD) WITH PROPOFOL N/A 05/01/2019   Procedure: ESOPHAGOGASTRODUODENOSCOPY (EGD) WITH PROPOFOL;  Surgeon: Toledo, Benay Pike, MD;  Location: [facility] ENDOSCOPY;  Service: Gastroenterology;  Laterality: N/A;   MASTECTOMY     SHOULDER ARTHROSCOPY WITH SUBACROMIAL DECOMPRESSION, ROTATOR CUFF REPAIR AND BICEP TENDON REPAIR Left 01/13/2008   TONSILLECTOMY     TUBAL LIGATION      Social History   Socioeconomic  History   Marital status: Married    Spouse name: Not on file   Number of children: Not on file   Years of education: Not on file   Highest education level: Not on file  Occupational History   Not on file  Tobacco Use   Smoking status: Every Day    Packs/day: 1.50    Years: 40.00    Pack years: 60.00    Types: Cigarettes   Smokeless tobacco: Never  Vaping Use   Vaping Use: Never used  Substance and Sexual Activity   Alcohol use: No   Drug use: No   Sexual activity: Yes    Partners: Male  Other Topics Concern   Not on file  Social History Narrative   Lives at home with husband   Social Determinants of Health   Financial Resource Strain: Not on file  Food Insecurity: Not on file  Transportation Needs: Not on file  Physical Activity: Not on file  Stress: Not on file  Social Connections: Not on file  Intimate Partner Violence: Not on file    Family History  Problem Relation Age of Onset   Cancer Father    Kidney failure Mother    Allergies  Allergen Reactions   Amoxicillin-Pot Clavulanate Diarrhea and Rash    Other reaction(s): Unknown   Biaxin [Clarithromycin] Diarrhea    Other reaction(s): Unknown   Oxycodone Itching   Codeine Rash   Nitrofurantoin Rash   I? Current Outpatient Medications  Medication Sig Dispense Refill  acetaminophen (TYLENOL) 325 MG tablet Take 2 tablets (650 mg total) by mouth every 8 (eight) hours as needed for mild pain. 40 tablet 0   Cholecalciferol 25 MCG (1000 UT) capsule Take 1,000 Units by mouth daily.     Coenzyme Q10 100 MG capsule Take 100 mg by mouth 2 (two) times daily.     dicyclomine (BENTYL) 10 MG capsule Take 10 mg by mouth 4 (four) times daily.     ertapenem (INVANZ) IVPB Inject 1 g into the vein daily for 10 days. Indication:  diverticular abscess First Dose: No Last Day of Therapy:  06/20/21 Labs - Once weekly:  CBC/D and CMP,  Method of administration: Mini-Bag Plus / Gravity Method of administration may be changed  at the discretion of home infusion pharmacist based upon assessment of the patient and/or caregiver's ability to self-administer the medication ordered. 10 Units 0   nystatin (MYCOSTATIN/NYSTOP) powder Apply topically 2 (two) times daily.     predniSONE (DELTASONE) 5 MG tablet Take 1 tablet by mouth daily.     rosuvastatin (CRESTOR) 10 MG tablet Take 10 mg by mouth 3 (three) times a week.     sodium chloride flush (NS) 0.9 % SOLN 5 mLs by Intracatheter route every 8 (eight) hours. 450 mL 0   traMADol (ULTRAM) 50 MG tablet Take 1 tablet (50 mg total) by mouth every 6 (six) hours as needed for moderate pain or severe pain. 20 tablet 0   VENTOLIN HFA 108 (90 Base) MCG/ACT inhaler Inhale 2 puffs into the lungs 2 (two) times daily.     No current facility-administered medications for this visit.     Abtx:  Anti-infectives (From admission, onward)    None       REVIEW OF SYSTEMS:  Const: negative fever, negative chills, some weight loss Eyes: negative diplopia or visual changes, negative eye pain ENT: negative coryza, negative sore throat Resp: negative cough, hemoptysis, dyspnea Cards: negative for chest pain, palpitations, lower extremity edema GU: negative for frequency, dysuria and hematuria GI: as above Skin: negative for rash and pruritus Heme: negative for easy bruising and gum/nose bleeding MS: fatigue, weakness Neurolo:negative for headaches, dizziness, vertigo, memory problems  Psych: depression  Endocrine: negative for thyroid, diabetes Allergy/Immunology- as above Objective:  VITALS:  BP (!) 131/91   Pulse (!) 102   Temp 98.1 F (36.7 C) (Temporal)   Resp 16   Ht '5\' 2"'$  (1.575 m)   Wt 126 lb (57.2 kg)   SpO2 92%   BMI 23.05 kg/m  LDA PICC line PHYSICAL EXAM:  General: Alert, cooperative, no distress, appears stated age.  Lungs: Clear to auscultation bilaterally. No Wheezing or Rhonchi. No rales. Heart: Regular rate and rhythm, no murmur, rub or  gallop. Abdomen: Soft, left lower quadrant drain.  Not much in the bulb extremities: atraumatic, no cyanosis. No edema. No clubbing Skin: No rashes or lesions. Or bruising Lymph: Cervical, supraclavicular normal. Neurologic: Grossly non-focal Pertinent Labs Lab Results CBC    Component Value Date/Time   WBC 5.7 06/11/2021 0626   RBC 4.52 06/11/2021 0626   HGB 13.2 06/11/2021 0626   HCT 41.0 06/11/2021 0626   PLT 273 06/11/2021 0626   MCV 90.7 06/11/2021 0626   MCH 29.2 06/11/2021 0626   MCHC 32.2 06/11/2021 0626   RDW 12.5 06/11/2021 0626   LYMPHSABS 2.6 04/28/2021 2134   MONOABS 0.7 04/28/2021 2134   EOSABS 0.2 04/28/2021 2134   BASOSABS 0.1 04/28/2021 2134  Latest Ref Rng & Units 06/08/2021    9:14 AM 06/07/2021    4:02 AM 06/06/2021    3:35 PM  CMP  Glucose 70 - 99 mg/dL 84   92   119    BUN 8 - 23 mg/dL '8   11   12    '$ Creatinine 0.44 - 1.00 mg/dL 0.63   0.76   0.92    Sodium 135 - 145 mmol/L 135   135   133    Potassium 3.5 - 5.1 mmol/L 4.0   3.6   3.7    Chloride 98 - 111 mmol/L 100   96   91    CO2 22 - 32 mmol/L '28   28   30    '$ Calcium 8.9 - 10.3 mg/dL 8.1   8.5   9.2    Total Protein 6.5 - 8.1 g/dL   8.0    Total Bilirubin 0.3 - 1.2 mg/dL   1.1    Alkaline Phos 38 - 126 U/L   75    AST 15 - 41 U/L   21    ALT 0 - 44 U/L   17        Microbiology: Recent Results (from the past 240 hour(s))  Blood culture (routine x 2)     Status: None   Collection Time: 06/06/21  3:40 PM   Specimen: Left Antecubital; Blood  Result Value Ref Range Status   Specimen Description LEFT ANTECUBITAL  Final   Special Requests   Final    BOTTLES DRAWN AEROBIC AND ANAEROBIC Blood Culture adequate volume   Culture   Final    NO GROWTH 5 DAYS Performed at [facility], [address] [place], [place] [address]    Report Status 06/11/2021 FINAL  Final  Blood culture (routine x 2)     Status: None   Collection Time: 06/06/21 11:57 PM   Specimen: BLOOD  Result  Value Ref Range Status   Specimen Description BLOOD RIGHT HAND  Final   Special Requests   Final    BOTTLES DRAWN AEROBIC AND ANAEROBIC Blood Culture results may not be optimal due to an excessive volume of blood received in culture bottles   Culture   Final    NO GROWTH 5 DAYS Performed at [facility], [address], [place], [place] [address]    Report Status 06/12/2021 FINAL  Final  Aerobic/Anaerobic Culture w Gram Stain (surgical/deep wound)     Status: None   Collection Time: 06/07/21 11:46 AM   Specimen: Abscess  Result Value Ref Range Status   Specimen Description   Final    ABSCESS Performed at [facility], [address] [place], [place] [address]    Special Requests   Final    NONE Performed at [facility], [address] [place], [place] [address]    Gram Stain   Final    ABUNDANT WBC PRESENT, PREDOMINANTLY PMN FEW GRAM POSITIVE COCCI MODERATE GRAM NEGATIVE RODS    Culture   Final    FEW PROTEUS MIRABILIS MODERATE BACTEROIDES FRAGILIS BETA LACTAMASE POSITIVE WITHIN MIXED ORGANISMS Performed at [facility], [place] [address] [place], [place] [address]    Report Status 06/10/2021 FINAL  Final   Organism ID, Bacteria PROTEUS MIRABILIS  Final      Susceptibility   Proteus mirabilis - MIC*    AMPICILLIN <=2 SENSITIVE Sensitive     CEFAZOLIN <=4 SENSITIVE Sensitive  CEFEPIME <=0.12 SENSITIVE Sensitive     CEFTAZIDIME <=1 SENSITIVE Sensitive     CEFTRIAXONE <=0.25 SENSITIVE Sensitive     CIPROFLOXACIN <=0.25 SENSITIVE Sensitive     GENTAMICIN <=1 SENSITIVE Sensitive     IMIPENEM 2 SENSITIVE Sensitive     TRIMETH/SULFA <=20 SENSITIVE Sensitive     AMPICILLIN/SULBACTAM <=2 SENSITIVE Sensitive     PIP/TAZO <=4 SENSITIVE Sensitive     * FEW PROTEUS MIRABILIS  Surgical PCR screen     Status: None   Collection Time: 06/08/21  9:15 PM   Specimen: Nasal Mucosa; Nasal Swab  Result Value Ref Range Status   MRSA, PCR NEGATIVE  NEGATIVE Final   Staphylococcus aureus NEGATIVE NEGATIVE Final    Comment: (NOTE) The Xpert SA Assay (FDA approved for NASAL specimens in patients 64 years of age and older), is one component of a comprehensive surveillance program. It is not intended to diagnose infection nor to guide or monitor treatment. Performed at [facility], [address], [place], [place] [address]      ? Impression/Recommendation Recurrent diverticulitis. Acute sigmoid diverticulitis with peridiverticular abscess.  Underwent CT-guided drain placement.  Proteus and Bacteroides fragilis in cultures.  She is currently on ertapenem to complete 2 weeks of treatment.  Because of twinges of pain may extend the antibiotic for a few more days until she gets a CAT scan on June 23, 2021.  Today we will do labs. Discussed the management with the patient and her husband in great detail.  Discussed with Dr. Lysle Pearl.  Months ___________________________________________________ Addendum WBC 11.8 Hb 16 Platelet 338 Albumin 3.1 Creatinine 0.71  Note:  This document was prepared using Dragon voice recognition software and may include unintentional dictation errors.

## 2021-06-21 ENCOUNTER — Telehealth: Payer: Self-pay

## 2021-06-21 NOTE — Telephone Encounter (Signed)
Patient called stating she feels her IV antibiotics are taking longer to infuse than they typically do. Patient stated she does not see anything concerning regarding her midline. I have reached out to [facility] to see if patient's nurse Danae Chen) could go out and evaluate the midline as well.

## 2021-06-22 ENCOUNTER — Telehealth: Payer: Self-pay | Admitting: Infectious Diseases

## 2021-06-22 NOTE — Telephone Encounter (Signed)
Spoke to patient Pt with diverticular abscess- with drain- had gotten 14 days of ertapenem until 6/5- she got one more dose yesterday and the midline is leaking- so it will be removed today- No oral antibiotics as she cannot easily tolerate any like Metronidazole- she is going for CT scan tomorrow Will observe off antibiotics

## 2021-06-22 NOTE — Telephone Encounter (Signed)
I spoke to the patient and she stated that her picc line has been leaking and she was unable to infuse her IV antibiotics today. Patient also was seen by her surgeon today and they wanted to reach out to Dr. Delaine Lame regarding the picc line  Verbal order given to Amy with [facility] for patient to stop IV antibiotics today and picc line can be removed per Dr. Delaine Lame. Amy verbalized understanding and will let nursing know as well to pull picc line.  Patient also advised that [facility] should be coming out to pull the picc line. Patient also advised that the surgeon should be sending in oral antibiotics for her per Dr. Delaine Lame.  Brianna Briggs

## 2021-06-23 ENCOUNTER — Other Ambulatory Visit: Payer: Self-pay | Admitting: Interventional Radiology

## 2021-06-23 ENCOUNTER — Ambulatory Visit
Admission: RE | Admit: 2021-06-23 | Discharge: 2021-06-23 | Disposition: A | Discharge status: Home / Self Care | Payer: Medicare Other | Source: Ambulatory Visit | Attending: Surgery | Admitting: Surgery

## 2021-06-23 ENCOUNTER — Ambulatory Visit
Admission: RE | Admit: 2021-06-23 | Discharge: 2021-06-23 | Disposition: A | Discharge status: Home / Self Care | Payer: Medicare Other | Source: Ambulatory Visit | Attending: Physician Assistant | Admitting: Physician Assistant

## 2021-06-23 DIAGNOSIS — K5720 Diverticulitis of large intestine with perforation and abscess without bleeding: Secondary | ICD-10-CM

## 2021-06-23 HISTORY — PX: IR RADIOLOGIST EVAL & MGMT: IMG5224

## 2021-06-23 MED ORDER — IOPAMIDOL (ISOVUE-370) INJECTION 76%
80.0000 mL | Freq: Once | INTRAVENOUS | Status: AC | PRN
Start: 2021-06-23 — End: 2021-06-23
  Administered 2021-06-23: 80 mL via INTRAVENOUS

## 2021-06-24 NOTE — Progress Notes (Signed)
Patient on schedule for drain reposition, called and spoke with patient on phone with pre procedure instructions given. Made aware to be here at 0900, NPO after MN prior to procedure as well as driver post procedure/recovery/discharge. Stated understanding.

## 2021-06-28 ENCOUNTER — Other Ambulatory Visit: Payer: Self-pay | Admitting: Radiology

## 2021-06-29 ENCOUNTER — Other Ambulatory Visit: Payer: Self-pay | Admitting: Interventional Radiology

## 2021-06-29 ENCOUNTER — Other Ambulatory Visit: Payer: Self-pay | Admitting: Surgery

## 2021-06-29 ENCOUNTER — Encounter: Payer: Self-pay | Admitting: Radiology

## 2021-06-29 ENCOUNTER — Ambulatory Visit
Admission: RE | Admit: 2021-06-29 | Discharge: 2021-06-29 | Disposition: A | Discharge status: Home / Self Care | Payer: Medicare Other | Source: Ambulatory Visit | Attending: Interventional Radiology | Admitting: Interventional Radiology

## 2021-06-29 ENCOUNTER — Other Ambulatory Visit: Payer: Self-pay

## 2021-06-29 DIAGNOSIS — F17210 Nicotine dependence, cigarettes, uncomplicated: Secondary | ICD-10-CM | POA: Insufficient documentation

## 2021-06-29 DIAGNOSIS — K5720 Diverticulitis of large intestine with perforation and abscess without bleeding: Secondary | ICD-10-CM | POA: Insufficient documentation

## 2021-06-29 DIAGNOSIS — Z9011 Acquired absence of right breast and nipple: Secondary | ICD-10-CM | POA: Insufficient documentation

## 2021-06-29 DIAGNOSIS — Z85828 Personal history of other malignant neoplasm of skin: Secondary | ICD-10-CM | POA: Diagnosis not present

## 2021-06-29 DIAGNOSIS — Q600 Renal agenesis, unilateral: Secondary | ICD-10-CM | POA: Insufficient documentation

## 2021-06-29 DIAGNOSIS — Z853 Personal history of malignant neoplasm of breast: Secondary | ICD-10-CM | POA: Insufficient documentation

## 2021-06-29 DIAGNOSIS — Z4803 Encounter for change or removal of drains: Secondary | ICD-10-CM | POA: Insufficient documentation

## 2021-06-29 HISTORY — PX: IR CATHETER TUBE CHANGE: IMG717

## 2021-06-29 MED ORDER — MIDAZOLAM HCL 2 MG/2ML IJ SOLN
INTRAMUSCULAR | Status: AC
  Filled 2021-06-29: qty 2

## 2021-06-29 MED ORDER — SODIUM CHLORIDE 0.9 % IV SOLN
INTRAVENOUS | Status: DC
  Filled 2021-06-29: qty 1000

## 2021-06-29 MED ORDER — IOHEXOL 350 MG/ML SOLN
4.0000 mL | Freq: Once | INTRAVENOUS | Status: AC | PRN
  Administered 2021-06-29: 4 mL

## 2021-06-29 MED ORDER — MIDAZOLAM HCL 2 MG/2ML IJ SOLN
INTRAMUSCULAR | Status: AC | PRN
  Administered 2021-06-29: .5 mg via INTRAVENOUS

## 2021-06-29 MED ORDER — FENTANYL CITRATE (PF) 100 MCG/2ML IJ SOLN
INTRAMUSCULAR | Status: AC | PRN
  Administered 2021-06-29: 25 ug via INTRAVENOUS

## 2021-06-29 MED ORDER — FENTANYL CITRATE (PF) 100 MCG/2ML IJ SOLN
INTRAMUSCULAR | Status: AC
  Filled 2021-06-29: qty 2

## 2021-06-29 MED ORDER — LIDOCAINE HCL 1 % IJ SOLN
INTRAMUSCULAR | Status: AC
  Administered 2021-06-29: 10 mL
  Filled 2021-06-29: qty 20

## 2021-06-29 NOTE — H&P (Signed)
Chief Complaint: Perforated diverticulitis with abscess. Intra abdominal abscess drain malposition. Patient presents for abscess drain exchange. And reposition.   Referring Physician(s): Dr. Ermalinda Memos  Supervising Physician: Juliet Rude  Patient Status: [facility] - Out-pt  History of Present Illness: Brianna Briggs is a 71 y.o. female outpatient. History of  right sided breast cancer s/p mastectomy, single kidney, SCC of the skin, recurrent diverticulitis. Admitted in May 2023 found to have perforated diverticulitis with pericolonic abscess. IR placed a 10 Fr placed on 5.23.23. Abscessogram  on 6.8.23 showed the abscess drain retracted with fistula. Patient presents for intra abdominal abscess drain exchange and reposition. Patient is being followed by infectious diseases and general surgery.  Patient alert and laying in bed. Family at bedside. Patient reports leaking around the exit site and  pain at the drain site. Patient states that the drain as always leaked. Denies any fevers, headache, chest pain, SOB, cough, nausea, vomiting or bleeding.    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 KA-type    Past Surgical History:  Procedure Laterality Date   ABDOMINAL HYSTERECTOMY     COLONOSCOPY WITH PROPOFOL N/A 05/01/2019   Procedure: COLONOSCOPY WITH PROPOFOL;  Surgeon: Toledo, Benay Pike, MD;  Location: [facility] ENDOSCOPY;  Service: Gastroenterology;  Laterality: N/A;   ESOPHAGOGASTRODUODENOSCOPY (EGD) WITH PROPOFOL N/A 05/01/2019   Procedure: ESOPHAGOGASTRODUODENOSCOPY (EGD) WITH PROPOFOL;  Surgeon: Toledo, Benay Pike, MD;  Location: [facility] ENDOSCOPY;  Service: Gastroenterology;  Laterality: N/A;   IR CATHETER TUBE CHANGE  06/29/2021   IR RADIOLOGIST  EVAL & MGMT  06/23/2021   MASTECTOMY     SHOULDER ARTHROSCOPY WITH SUBACROMIAL DECOMPRESSION, ROTATOR CUFF REPAIR AND BICEP TENDON REPAIR Left 01/13/2008   TONSILLECTOMY     TUBAL LIGATION      Allergies: Amoxicillin-pot clavulanate, Biaxin [clarithromycin], Oxycodone, Codeine, and Nitrofurantoin  Medications: Prior to Admission medications   Medication Sig Start Date End Date Taking? Authorizing Provider  sodium chloride flush (NS) 0.9 % SOLN 5 mLs by Intracatheter route every 8 (eight) hours. 06/11/21 07/11/21 Yes Sakai, Isami, DO  acetaminophen (TYLENOL) 325 MG tablet Take 2 tablets (650 mg total) by mouth every 8 (eight) hours as needed for mild pain. 06/11/21 07/11/21  Benjamine Sprague, DO  Cholecalciferol 25 MCG (1000 UT) capsule Take 1,000 Units by mouth daily. 04/27/21   [provider]  Coenzyme Q10 100 MG capsule Take 100 mg by mouth 2 (two) times daily. 04/27/21   [provider]  dicyclomine (BENTYL) 10 MG capsule Take 10 mg by mouth 4 (four) times daily. 05/31/21   [provider]  nystatin (MYCOSTATIN/NYSTOP) powder Apply topically 2 (two) times daily. 05/13/21   [provider]  predniSONE (DELTASONE) 5 MG tablet Take 1 tablet by mouth daily. 05/27/21   [provider]  rosuvastatin (CRESTOR) 10 MG tablet Take 10 mg by mouth 3 (three) times a week. 04/28/21   [provider]  traMADol (ULTRAM) 50 MG tablet Take 1 tablet (50 mg total) by mouth every 6 (six) hours as needed for moderate pain or severe pain. 06/11/21 06/11/22  Lysle Pearl, Isami, DO  VENTOLIN HFA 108 (90 Base) MCG/ACT inhaler Inhale 2 puffs into the lungs 2 (two)  times daily. 04/12/21   [provider]     Family History  Problem Relation Age of Onset   Cancer Father    Kidney failure Mother     Social History   Socioeconomic History   Marital status: Married    Spouse name: Not on file   Number of children: Not on file   Years of education: Not on file   Highest  education level: Not on file  Occupational History   Not on file  Tobacco Use   Smoking status: Every Day    Packs/day: 1.50    Years: 40.00    Total pack years: 60.00    Types: Cigarettes   Smokeless tobacco: Never  Vaping Use   Vaping Use: Never used  Substance and Sexual Activity   Alcohol use: No   Drug use: No   Sexual activity: Yes    Partners: Male  Other Topics Concern   Not on file  Social History Narrative   Lives at home with husband   Social Determinants of Health   Financial Resource Strain: Not on file  Food Insecurity: Not on file  Transportation Needs: Not on file  Physical Activity: Not on file  Stress: Not on file  Social Connections: Not on file    Review of Systems: A 12 point ROS discussed and pertinent positives are indicated in the HPI above.  All other systems are negative.  Review of Systems  Constitutional:  Negative for fatigue and fever.  HENT:  Negative for congestion.   Respiratory:  Negative for cough and shortness of breath.   Gastrointestinal:  Positive for abdominal pain (drain site). Negative for diarrhea, nausea and vomiting.    Vital Signs: BP 92/64   Pulse 84   Temp 97.7 F (36.5 C) (Oral)   Resp 17   Ht '5\' 2"'$  (1.575 m)   Wt 126 lb (57.2 kg)   SpO2 96%   BMI 23.05 kg/m   Physical Exam Vitals and nursing note reviewed.  Constitutional:      Appearance: She is well-developed.  HENT:     Head: Normocephalic and atraumatic.  Eyes:     Conjunctiva/sclera: Conjunctivae normal.  Cardiovascular:     Rate and Rhythm: Regular rhythm. Tachycardia present.  Pulmonary:     Effort: Pulmonary effort is normal.  Musculoskeletal:        General: Normal range of motion.     Cervical back: Normal range of motion.  Skin:    General: Skin is warm.  Neurological:     Mental Status: She is alert and oriented to person, place, and time.     Imaging: IR Catheter Tube Change  Result Date: 06/29/2021 INDICATION: Diverticular  abscess with drain malpositioned EXAM: Exchange of abdominopelvic drainage catheter using fluoroscopic guidance MEDICATIONS: Per EMR ANESTHESIA/SEDATION: Moderate (conscious) sedation was employed during this procedure. A total of Versed 0.5 mg and Fentanyl 25 mcg was administered intravenously by the radiology nurse. Total intra-service moderate Sedation Time: 10 minutes. The patient's level of consciousness and vital signs were monitored continuously by radiology nursing throughout the procedure under my direct supervision. COMPLICATIONS: None immediate. FLUOROSCOPY: FLUOROSCOPY Fluoroscopic time: 0.6 minutes (4 mGy) PROCEDURE: Informed written consent was obtained from the patient after a thorough discussion of the procedural risks, benefits and alternatives. All questions were addressed. Maximal Sterile Barrier Technique was utilized including caps, mask, sterile gowns, sterile gloves, sterile drape, hand hygiene and skin antiseptic. A timeout was performed prior to the initiation of  the procedure. The patient was placed supine on the exam table. The left lower quadrant was prepped and draped in the standard sterile fashion with inclusion of the existing drainage catheter within the sterile field. 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 The locking loop was released, and over an Amplatz wire, a new 10 [place] multipurpose drainage catheter was advanced into the decompressed cavity, with more appropriate positioning of the distal sideholes. Locking loop was formed, and repeat injection demonstrated appropriate location within the decompressed cavity and persistent fistula. The catheter was secured to the skin using silk suture and a dressing, and was placed to bag drainage. The patient tolerated the procedure well without immediate complication. IMPRESSION: Successful exchange and  repositioning of left lower quadrant abdominopelvic drainage catheter for treatment of diverticular fistula. Colonic fistula remains patent on today's exam. Patient will return for 2 week drain check. Additional management per surgery. Electronically Signed   By: Albin Felling M.D.   On: 06/29/2021 10:47   DG Sinus/Fist Tube Chk-Non GI  Result Date: 06/23/2021 INDICATION: Diverticulitis normal status post percutaneous drainage on Jun 07, 2021 EXAM: Drainage catheter injection using fluoroscopy MEDICATIONS: None ANESTHESIA/SEDATION: None COMPLICATIONS: None immediate. PROCEDURE: Informed written consent was obtained from the patient after a thorough discussion of the procedural risks, benefits and alternatives. All questions were addressed. Maximal Sterile Barrier Technique was utilized including caps, mask, sterile gowns, sterile gloves, sterile drape, hand hygiene and skin antiseptic. A timeout was performed prior to the initiation of the procedure. The patient was placed supine on the exam table. Injection of the existing drainage catheter in the left lower quadrant was performed using fluoroscopy. The drainage catheter is slightly retracted. Images demonstrate a decompressed abscess cavity. There is prompt passage of contrast material into the adjacent sigmoid colon. There is some reflux of contrast material along the percutaneous drainage tract towards the skin entry site. IMPRESSION: Study of the left lower quadrant drainage catheter demonstrates decompressed cavity with fistulous connection to the adjacent sigmoid colon. The drainage catheter is partially retracted. The patient will be scheduled for drainage catheter repositioning in the interventional radiology department. Follow-up with surgery advised. Electronically Signed   By: Albin Felling M.D.   On: 06/23/2021 14:48   IR Radiologist Eval & Mgmt  Result Date: 06/23/2021 EXAM: NEW PATIENT OFFICE VISIT CHIEF COMPLAINT: Refer to separate dictation  HISTORY OF PRESENT ILLNESS: Refer to separate dictation REVIEW OF SYSTEMS: Refer to separate dictation PHYSICAL EXAMINATION: Refer to separate dictation ASSESSMENT AND PLAN: Refer to separate dictation Electronically Signed   By: Albin Felling M.D.   On: 06/23/2021 14:23   CT ABDOMEN PELVIS W CONTRAST  Result Date: 06/23/2021 CLINICAL DATA:  Follow-up drain, history of diverticulitis status post percutaneous drain placement on Jun 07, 2021 EXAM: CT ABDOMEN AND PELVIS WITH CONTRAST TECHNIQUE: Multidetector CT imaging of the abdomen and pelvis was performed using the standard protocol following bolus administration of intravenous contrast. RADIATION DOSE REDUCTION: This exam was performed according to the departmental dose-optimization program which includes automated exposure control, adjustment of the mA and/or kV according to patient size and/or use of iterative reconstruction technique. CONTRAST:  2m ISOVUE-370 IOPAMIDOL (ISOVUE-370) INJECTION 76% COMPARISON:  May 2023 FINDINGS: Inferior chest: The lung bases are well-aerated. Hepatobiliary: Hepatic steatosis. No focal liver lesion. No intrahepatic or extrahepatic biliary ductal dilation. The gallbladder appears normal. Spleen: Normal in size without focal abnormality. Pancreas: No  pancreatic ductal dilatation or surrounding inflammatory changes. Adrenals/Urinary Tract: Adrenal glands are unremarkable. Similar markedly atrophic appearance of the right kidney. Similar appearance of the left kidney with multifocal cortical scarring, no nephrolithiasis or hydronephrosis. Bladder is unremarkable. Stomach/Bowel: Right lower quadrant percutaneous drainage catheter terminates adjacent to the sigmoid colon. Minimal degree of surrounding fluid and punctate foci of free air, improved from previous exam. There are residual inflammatory changes of the proximal sigmoid colon. The stomach and small bowel are normal. Reproductive: Status post hysterectomy. No adnexal  masses. Lymphatic: No enlarged lymph nodes in the abdomen or pelvis. Vasculature: The abdominal aorta is normal in caliber. Scattered atherosclerotic calcifications. The portal venous system is patent. Other: No abdominopelvic ascites. Musculoskeletal: No aggressive osseous lesions. The soft tissues are unremarkable. IMPRESSION: Status post percutaneous drainage of the left lower quadrant abscess with small residual fluid and foci of free air. Improved but residual inflammatory changes of the proximal sigmoid colon. Attention on forthcoming drain injection. Electronically Signed   By: Albin Felling M.D.   On: 06/23/2021 14:22   DG Abd 1 View  Result Date: 06/11/2021 CLINICAL DATA:  Hospital stay for, diverticulitis. EXAM: ABDOMEN - 1 VIEW COMPARISON:  None Available. FINDINGS: The bowel gas pattern is normal. Pigtail catheter projecting over the left pelvis. No acute osseous abnormality. IMPRESSION: Nonobstructive bowel gas pattern. Pigtail catheter projecting over the left hemipelvis. Electronically Signed   By: Keane Police D.O.   On: 06/11/2021 11:43   [place] EKG SITE RITE  Result Date: 06/10/2021 If Site Rite image not attached, placement could not be confirmed due to current cardiac rhythm.  DG Chest 1 View  Result Date: 06/08/2021 CLINICAL DATA:  Cough. EXAM: CHEST  1 VIEW COMPARISON:  Chest x-ray April 28, 2021. FINDINGS: Chronic hyperinflation. Mild prominence of the lung markings, similar to prior. No consolidation. Cardiomediastinal silhouette is within normal limits and unchanged. No displaced fracture. IMPRESSION: Chronic hyperinflation, suggestive of COPD/emphysema. No evidence of acute cardiopulmonary disease. Electronically Signed   By: Margaretha Sheffield M.D.   On: 06/08/2021 08:46   CT IMAGE GUIDED DRAINAGE BY PERCUTANEOUS CATHETER  Result Date: 06/07/2021 INDICATION: Perforated diverticulitis with pericolonic abscess. EXAM: CT GUIDED LEFT LOWER QUADRANT ABSCESS DRAINAGE CATHETER  PLACEMENT. RADIATION DOSE REDUCTION: This exam was performed according to the departmental dose-optimization program which includes automated exposure control, adjustment of the mA and/or kV according to patient size and/or use of iterative reconstruction technique. COMPARISON:  CT AP, 06/06/2021. MEDICATIONS: The patient is currently admitted to the hospital and receiving intravenous antibiotics. The antibiotics were administered within an appropriate time frame prior to the initiation of the procedure. ANESTHESIA/SEDATION: Local anesthetic was administered. The patient was continuously monitored during the procedure by the interventional radiology nurse under my direct supervision. CONTRAST:  None COMPLICATIONS: None immediate. PROCEDURE: Informed written consent was obtained from the patient after a discussion of the risks, benefits and alternatives to treatment. The patient was placed supine on the CT gantry and a pre procedural CT was performed re-demonstrating the known abscess/fluid collection within the LEFT lower quadrant. The procedure was planned. A timeout was performed prior to the initiation of the procedure. The LEFT lower quadrant was prepped and draped in the usual sterile fashion. The overlying soft tissues were anesthetized with 1% lidocaine with epinephrine. Appropriate trajectory was planned with the use of a 22 gauge spinal needle. An 18 gauge trocar needle was advanced into the abscess/fluid collection and a short Amplatz super stiff wire was coiled within the collection.  Appropriate positioning was confirmed with a limited CT scan. The tract was serially dilated allowing placement of a 10 Fr drainage catheter. Appropriate positioning was confirmed with a limited postprocedural CT scan. 15 mL of purulent fluid was aspirated. The tube was connected to a bulb suction and sutured in place. A dressing was placed. The patient tolerated the procedure well without immediate post procedural  complication. IMPRESSION: Successful CT guided placement of a 10 Fr drainage catheter into the LEFT lower quadrant abscess with aspiration of 25 mL of purulent fluid. Samples were sent to the laboratory as requested by the ordering clinical team. Michaelle Birks, MD [facility]iology Electronically Signed   By: Michaelle Birks M.D.   On: 06/07/2021 12:05   CT ABDOMEN PELVIS W CONTRAST  Result Date: 06/06/2021 CLINICAL DATA:  Abdominal pain and nausea.  Leukocytosis. EXAM: CT ABDOMEN AND PELVIS WITH CONTRAST TECHNIQUE: Multidetector CT imaging of the abdomen and pelvis was performed using the standard protocol following bolus administration of intravenous contrast. RADIATION DOSE REDUCTION: This exam was performed according to the departmental dose-optimization program which includes automated exposure control, adjustment of the mA and/or kV according to patient size and/or use of iterative reconstruction technique. CONTRAST:  139m OMNIPAQUE IOHEXOL 300 MG/ML  SOLN COMPARISON:  CT abdomen pelvis dated July 21, 2019. FINDINGS: Lower chest: No acute abnormality. Hepatobiliary: No focal liver abnormality is seen. No gallstones, gallbladder wall thickening, or biliary dilatation. Pancreas: Unremarkable. No pancreatic ductal dilatation or surrounding inflammatory changes. Spleen: Normal in size without focal abnormality. Adrenals/Urinary Tract: The adrenal glands are unremarkable. Similar severely atrophied right kidney with cystic changes. Unchanged multifocal left renal scarring and small simple cysts. No follow-up imaging is recommended. No renal calculi or hydronephrosis. The bladder is unremarkable. Stomach/Bowel: The stomach is within normal limits. Left-sided colonic diverticulosis. Severe wall thickening of the proximal sigmoid colon with prominent surrounding inflammatory changes and adjacent 4.8 x 2.4 cm abscess (series 2, image 55). No extraluminal air. The  small bowel is unremarkable. Normal appendix. Vascular/Lymphatic: Aortic atherosclerosis. No enlarged abdominal or pelvic lymph nodes. Reproductive: Status post hysterectomy. No adnexal masses. Other: No free fluid or pneumoperitoneum. Musculoskeletal: No acute or significant osseous findings. IMPRESSION: 1. Recurrent acute sigmoid diverticulitis with adjacent 4.8 x 2.4 cm abscess. No extraluminal air. 2. Aortic Atherosclerosis (ICD10-I70.0). Electronically Signed   By: WTitus DubinM.D.   On: 06/06/2021 16:57    Labs:  CBC: Recent Labs    06/09/21 0418 06/10/21 0416 06/11/21 0626 06/16/21 1158  WBC 6.7 6.6 5.7 11.8*  HGB 11.8* 12.6 13.2 16.0*  HCT 36.6 38.9 41.0 49.6*  PLT 193 224 273 338    COAGS: Recent Labs    06/07/21 0402  INR 1.2    BMP: Recent Labs    06/06/21 1535 06/07/21 0402 06/08/21 0914 06/16/21 1158  NA 133* 135 135 137  K 3.7 3.6 4.0 3.8  CL 91* 96* 100 98  CO2 '30 28 28 29  '$ GLUCOSE 119* 92 84 121*  BUN '12 11 8 9  '$ CALCIUM 9.2 8.5* 8.1* 9.1  CREATININE 0.92 0.76 0.63 0.71  GFRNONAA >60 >60 >60 >60    LIVER FUNCTION TESTS: Recent Labs    04/28/21 2134 06/06/21 1535 06/16/21 1158  BILITOT 0.4 1.1 0.5  AST '18 21 23  '$ ALT '13 17 13  '$ ALKPHOS 73 75 77  PROT 7.8 8.0 7.6  ALBUMIN 3.7 3.4* 3.1*      Assessment and Plan:  71y.o.  female outpatient. History of right sided breast cancer s/p mastectomy, single kidney, SCC of the skin, recurrent diverticulitis. Admitted in May 2023 found to have perforated diverticulitis with pericolonic abscess. IR placed a 10 Fr placed on 5.23.23. Abscessogram  on 6.8.23 showed the abscess drain retracted with fistula. Patient presents for intra abdominal abscess drain exchange and reposition.Patient is being followed by infectious diseases and general surgery.  WBC from 6.1.23 11.8. All other labs and medications are within acceptable parameters. Allergies include augmenting clarithromycin, oxycodone, codeine,  nitrofurantoin. Patient has been NPO since midnight.   Risks and benefits discussed with the patient including bleeding, infection, damage to adjacent structures, bowel perforation/fistula connection, and sepsis.  All of the patient's questions were answered, patient is agreeable to proceed. Consent signed and in chart.   Thank you for this interesting consult.  I greatly enjoyed meeting Emmalena Canny and look forward to participating in their care.  A copy of this report was sent to the requesting provider on this date.  Electronically Signed: Jacqualine Mau, NP 06/29/2021, 12:44 PM   I spent a total of  30 Minutes   in face to face in clinical consultation, greater than 50% of which was counseling/coordinating care for intra abdominal abscess drain exchange and reposition.

## 2021-06-29 NOTE — Procedures (Signed)
Interventional Radiology Procedure Note  Date of Procedure: 06/29/2021  Procedure: Drain exchange   Findings:  1. Drain exchange of LLQ drain using fluoro    Complications: No immediate complications noted.   Estimated Blood Loss: minimal  Follow-up and Recommendations: 1. Drain check in 2 weeks    Albin Felling, MD  [facility]  06/29/2021 10:41 AM

## 2021-07-07 ENCOUNTER — Other Ambulatory Visit: Payer: Medicare Other

## 2021-07-14 ENCOUNTER — Ambulatory Visit
Admission: RE | Admit: 2021-07-14 | Discharge: 2021-07-14 | Disposition: A | Discharge status: Home / Self Care | Payer: Medicare Other | Source: Ambulatory Visit | Attending: Surgery | Admitting: Surgery

## 2021-07-14 DIAGNOSIS — K5720 Diverticulitis of large intestine with perforation and abscess without bleeding: Secondary | ICD-10-CM

## 2021-07-14 HISTORY — PX: IR RADIOLOGIST EVAL & MGMT: IMG5224

## 2021-07-18 ENCOUNTER — Emergency Department: Payer: Medicare Other

## 2021-07-18 ENCOUNTER — Other Ambulatory Visit: Payer: Self-pay

## 2021-07-18 DIAGNOSIS — E782 Mixed hyperlipidemia: Secondary | ICD-10-CM | POA: Diagnosis present

## 2021-07-18 DIAGNOSIS — J9621 Acute and chronic respiratory failure with hypoxia: Secondary | ICD-10-CM | POA: Diagnosis present

## 2021-07-18 DIAGNOSIS — A419 Sepsis, unspecified organism: Principal | ICD-10-CM | POA: Diagnosis present

## 2021-07-18 DIAGNOSIS — J449 Chronic obstructive pulmonary disease, unspecified: Secondary | ICD-10-CM | POA: Diagnosis present

## 2021-07-18 DIAGNOSIS — F17210 Nicotine dependence, cigarettes, uncomplicated: Secondary | ICD-10-CM | POA: Diagnosis present

## 2021-07-18 DIAGNOSIS — Z881 Allergy status to other antibiotic agents status: Secondary | ICD-10-CM

## 2021-07-18 DIAGNOSIS — Z880 Allergy status to penicillin: Secondary | ICD-10-CM

## 2021-07-18 DIAGNOSIS — Z7952 Long term (current) use of systemic steroids: Secondary | ICD-10-CM

## 2021-07-18 DIAGNOSIS — Z885 Allergy status to narcotic agent status: Secondary | ICD-10-CM

## 2021-07-18 DIAGNOSIS — I959 Hypotension, unspecified: Secondary | ICD-10-CM | POA: Diagnosis present

## 2021-07-18 DIAGNOSIS — I739 Peripheral vascular disease, unspecified: Secondary | ICD-10-CM | POA: Diagnosis present

## 2021-07-18 DIAGNOSIS — Z90710 Acquired absence of both cervix and uterus: Secondary | ICD-10-CM

## 2021-07-18 DIAGNOSIS — Z853 Personal history of malignant neoplasm of breast: Secondary | ICD-10-CM

## 2021-07-18 DIAGNOSIS — R1032 Left lower quadrant pain: Secondary | ICD-10-CM | POA: Diagnosis not present

## 2021-07-18 DIAGNOSIS — I2510 Atherosclerotic heart disease of native coronary artery without angina pectoris: Secondary | ICD-10-CM | POA: Diagnosis present

## 2021-07-18 DIAGNOSIS — E871 Hypo-osmolality and hyponatremia: Secondary | ICD-10-CM | POA: Diagnosis present

## 2021-07-18 DIAGNOSIS — E860 Dehydration: Secondary | ICD-10-CM | POA: Diagnosis present

## 2021-07-18 DIAGNOSIS — Z8619 Personal history of other infectious and parasitic diseases: Secondary | ICD-10-CM

## 2021-07-18 DIAGNOSIS — Z86010 Personal history of colonic polyps: Secondary | ICD-10-CM

## 2021-07-18 DIAGNOSIS — K5900 Constipation, unspecified: Secondary | ICD-10-CM | POA: Diagnosis present

## 2021-07-18 DIAGNOSIS — Z79899 Other long term (current) drug therapy: Secondary | ICD-10-CM

## 2021-07-18 DIAGNOSIS — Z85828 Personal history of other malignant neoplasm of skin: Secondary | ICD-10-CM

## 2021-07-18 DIAGNOSIS — K5720 Diverticulitis of large intestine with perforation and abscess without bleeding: Secondary | ICD-10-CM | POA: Diagnosis present

## 2021-07-18 LAB — CBC
HCT: 45.2 % (ref 36.0–46.0)
Hemoglobin: 14.6 g/dL (ref 12.0–15.0)
MCH: 28.2 pg (ref 26.0–34.0)
MCHC: 32.3 g/dL (ref 30.0–36.0)
MCV: 87.4 fL (ref 80.0–100.0)
Platelets: 388 10*3/uL (ref 150–400)
RBC: 5.17 MIL/uL — ABNORMAL HIGH (ref 3.87–5.11)
RDW: 13 % (ref 11.5–15.5)
WBC: 17.1 10*3/uL — ABNORMAL HIGH (ref 4.0–10.5)
nRBC: 0 % (ref 0.0–0.2)

## 2021-07-18 LAB — COMPREHENSIVE METABOLIC PANEL
ALT: 12 U/L (ref 0–44)
AST: 19 U/L (ref 15–41)
Albumin: 2.9 g/dL — ABNORMAL LOW (ref 3.5–5.0)
Alkaline Phosphatase: 75 U/L (ref 38–126)
Anion gap: 12 (ref 5–15)
BUN: 11 mg/dL (ref 8–23)
CO2: 28 mmol/L (ref 22–32)
Calcium: 9.2 mg/dL (ref 8.9–10.3)
Chloride: 92 mmol/L — ABNORMAL LOW (ref 98–111)
Creatinine, Ser: 0.87 mg/dL (ref 0.44–1.00)
GFR, Estimated: >60 mL/min (ref >60)
Glucose, Bld: 127 mg/dL — ABNORMAL HIGH (ref 70–99)
Potassium: 4.2 mmol/L (ref 3.5–5.1)
Sodium: 132 mmol/L — ABNORMAL LOW (ref 135–145)
Total Bilirubin: 0.9 mg/dL (ref 0.3–1.2)
Total Protein: 7.6 g/dL (ref 6.5–8.1)

## 2021-07-18 LAB — LIPASE, BLOOD: Lipase: 20 U/L (ref 11–51)

## 2021-07-18 NOTE — ED Triage Notes (Addendum)
Pt in with co lower abd pain and rectal pain since Saturday. Pt has been taking stools softeners states has not had normal BM in weeks. Pt has recent hx of diverticular abscess with drain placement.

## 2021-07-19 ENCOUNTER — Inpatient Hospital Stay
Admission: EM | Admit: 2021-07-19 | Discharge: 2021-07-25 | DRG: 871 | Disposition: A | Discharge status: Home Health | Payer: Medicare Other | Attending: Internal Medicine | Admitting: Internal Medicine

## 2021-07-19 ENCOUNTER — Emergency Department: Payer: Medicare Other

## 2021-07-19 DIAGNOSIS — Z8619 Personal history of other infectious and parasitic diseases: Secondary | ICD-10-CM | POA: Diagnosis not present

## 2021-07-19 DIAGNOSIS — E782 Mixed hyperlipidemia: Secondary | ICD-10-CM | POA: Diagnosis present

## 2021-07-19 DIAGNOSIS — J9621 Acute and chronic respiratory failure with hypoxia: Secondary | ICD-10-CM | POA: Diagnosis present

## 2021-07-19 DIAGNOSIS — Z881 Allergy status to other antibiotic agents status: Secondary | ICD-10-CM | POA: Diagnosis not present

## 2021-07-19 DIAGNOSIS — Z86010 Personal history of colonic polyps: Secondary | ICD-10-CM | POA: Diagnosis not present

## 2021-07-19 DIAGNOSIS — E860 Dehydration: Secondary | ICD-10-CM | POA: Diagnosis present

## 2021-07-19 DIAGNOSIS — Z880 Allergy status to penicillin: Secondary | ICD-10-CM | POA: Diagnosis not present

## 2021-07-19 DIAGNOSIS — R1032 Left lower quadrant pain: Secondary | ICD-10-CM | POA: Diagnosis present

## 2021-07-19 DIAGNOSIS — I739 Peripheral vascular disease, unspecified: Secondary | ICD-10-CM | POA: Diagnosis present

## 2021-07-19 DIAGNOSIS — K5792 Diverticulitis of intestine, part unspecified, without perforation or abscess without bleeding: Secondary | ICD-10-CM | POA: Diagnosis not present

## 2021-07-19 DIAGNOSIS — F17210 Nicotine dependence, cigarettes, uncomplicated: Secondary | ICD-10-CM | POA: Diagnosis present

## 2021-07-19 DIAGNOSIS — K5720 Diverticulitis of large intestine with perforation and abscess without bleeding: Secondary | ICD-10-CM | POA: Diagnosis present

## 2021-07-19 DIAGNOSIS — J449 Chronic obstructive pulmonary disease, unspecified: Secondary | ICD-10-CM

## 2021-07-19 DIAGNOSIS — Z85828 Personal history of other malignant neoplasm of skin: Secondary | ICD-10-CM | POA: Diagnosis not present

## 2021-07-19 DIAGNOSIS — Z90710 Acquired absence of both cervix and uterus: Secondary | ICD-10-CM | POA: Diagnosis not present

## 2021-07-19 DIAGNOSIS — I959 Hypotension, unspecified: Secondary | ICD-10-CM | POA: Diagnosis present

## 2021-07-19 DIAGNOSIS — Z79899 Other long term (current) drug therapy: Secondary | ICD-10-CM | POA: Diagnosis not present

## 2021-07-19 DIAGNOSIS — A419 Sepsis, unspecified organism: Secondary | ICD-10-CM

## 2021-07-19 DIAGNOSIS — Z7952 Long term (current) use of systemic steroids: Secondary | ICD-10-CM | POA: Diagnosis not present

## 2021-07-19 DIAGNOSIS — K632 Fistula of intestine: Secondary | ICD-10-CM | POA: Diagnosis not present

## 2021-07-19 DIAGNOSIS — K5900 Constipation, unspecified: Secondary | ICD-10-CM | POA: Diagnosis present

## 2021-07-19 DIAGNOSIS — Z885 Allergy status to narcotic agent status: Secondary | ICD-10-CM | POA: Diagnosis not present

## 2021-07-19 DIAGNOSIS — Z853 Personal history of malignant neoplasm of breast: Secondary | ICD-10-CM | POA: Diagnosis not present

## 2021-07-19 DIAGNOSIS — I2510 Atherosclerotic heart disease of native coronary artery without angina pectoris: Secondary | ICD-10-CM | POA: Diagnosis present

## 2021-07-19 DIAGNOSIS — E871 Hypo-osmolality and hyponatremia: Secondary | ICD-10-CM | POA: Diagnosis present

## 2021-07-19 LAB — BASIC METABOLIC PANEL
Anion gap: 7 (ref 5–15)
BUN: 10 mg/dL (ref 8–23)
CO2: 27 mmol/L (ref 22–32)
Calcium: 8.5 mg/dL — ABNORMAL LOW (ref 8.9–10.3)
Chloride: 100 mmol/L (ref 98–111)
Creatinine, Ser: 0.72 mg/dL (ref 0.44–1.00)
GFR, Estimated: >60 mL/min (ref >60)
Glucose, Bld: 99 mg/dL (ref 70–99)
Potassium: 4.1 mmol/L (ref 3.5–5.1)
Sodium: 134 mmol/L — ABNORMAL LOW (ref 135–145)

## 2021-07-19 LAB — CBC
HCT: 39.2 % (ref 36.0–46.0)
Hemoglobin: 12.6 g/dL (ref 12.0–15.0)
MCH: 28.4 pg (ref 26.0–34.0)
MCHC: 32.1 g/dL (ref 30.0–36.0)
MCV: 88.5 fL (ref 80.0–100.0)
Platelets: 300 10*3/uL (ref 150–400)
RBC: 4.43 MIL/uL (ref 3.87–5.11)
RDW: 12.9 % (ref 11.5–15.5)
WBC: 13.2 10*3/uL — ABNORMAL HIGH (ref 4.0–10.5)
nRBC: 0 % (ref 0.0–0.2)

## 2021-07-19 LAB — BRAIN NATRIURETIC PEPTIDE: B Natriuretic Peptide: 80.9 pg/mL (ref 0.0–100.0)

## 2021-07-19 LAB — LACTIC ACID, PLASMA
Lactic Acid, Venous: 0.9 mmol/L (ref 0.5–1.9)
Lactic Acid, Venous: 1.3 mmol/L (ref 0.5–1.9)

## 2021-07-19 LAB — URINALYSIS, ROUTINE W REFLEX MICROSCOPIC
Bilirubin Urine: NEGATIVE
Glucose, UA: NEGATIVE mg/dL
Hgb urine dipstick: NEGATIVE
Ketones, ur: NEGATIVE mg/dL
Leukocytes,Ua: NEGATIVE
Nitrite: NEGATIVE
Protein, ur: NEGATIVE mg/dL
Specific Gravity, Urine: 1.033 — ABNORMAL HIGH (ref 1.005–1.030)
pH: 6 (ref 5.0–8.0)

## 2021-07-19 LAB — PROTIME-INR
INR: 1.3 — ABNORMAL HIGH (ref 0.8–1.2)
Prothrombin Time: 15.9 seconds — ABNORMAL HIGH (ref 11.4–15.2)

## 2021-07-19 LAB — PROCALCITONIN: Procalcitonin: <0.1 ng/mL

## 2021-07-19 LAB — CORTISOL-AM, BLOOD: Cortisol - AM: 17.4 ug/dL (ref 6.7–22.6)

## 2021-07-19 MED ORDER — SODIUM CHLORIDE 0.9 % IV SOLN
2.0000 g | INTRAVENOUS | Status: DC
  Administered 2021-07-20 – 2021-07-22 (×3): 2 g via INTRAVENOUS
  Filled 2021-07-19 (×3): qty 2
  Filled 2021-07-19: qty 20

## 2021-07-19 MED ORDER — SODIUM CHLORIDE 0.9 % IV SOLN: 2.0000 g | INTRAVENOUS | Status: DC

## 2021-07-19 MED ORDER — LIDOCAINE VISCOUS HCL 2 % MT SOLN
15.0000 mL | Freq: Once | OROMUCOSAL | Status: AC
  Administered 2021-07-19: 15 mL via OROMUCOSAL
  Filled 2021-07-19: qty 15

## 2021-07-19 MED ORDER — METRONIDAZOLE 500 MG/100ML IV SOLN
500.0000 mg | Freq: Two times a day (BID) | INTRAVENOUS | Status: DC
  Administered 2021-07-19 – 2021-07-23 (×8): 500 mg via INTRAVENOUS
  Filled 2021-07-19 (×9): qty 100

## 2021-07-19 MED ORDER — ALBUTEROL SULFATE (2.5 MG/3ML) 0.083% IN NEBU
2.5000 mg | INHALATION_SOLUTION | Freq: Two times a day (BID) | RESPIRATORY_TRACT | Status: DC
Start: 2021-07-19 — End: 2021-07-20
  Administered 2021-07-19 (×2): 2.5 mg via RESPIRATORY_TRACT
  Filled 2021-07-19 (×2): qty 3

## 2021-07-19 MED ORDER — PREDNISONE 10 MG PO TABS
5.0000 mg | ORAL_TABLET | Freq: Every day | ORAL | Status: DC
  Administered 2021-07-19 – 2021-07-20 (×2): 5 mg via ORAL
  Filled 2021-07-19 (×2): qty 1

## 2021-07-19 MED ORDER — TRAMADOL HCL 50 MG PO TABS
50.0000 mg | ORAL_TABLET | Freq: Four times a day (QID) | ORAL | Status: DC | PRN
  Administered 2021-07-19 – 2021-07-25 (×14): 50 mg via ORAL
  Filled 2021-07-19 (×14): qty 1

## 2021-07-19 MED ORDER — IOHEXOL 300 MG/ML  SOLN
80.0000 mL | Freq: Once | INTRAMUSCULAR | Status: AC | PRN
  Administered 2021-07-19: 80 mL via INTRAVENOUS

## 2021-07-19 MED ORDER — SODIUM CHLORIDE 0.9 % IV SOLN
2.0000 g | Freq: Once | INTRAVENOUS | Status: AC
  Administered 2021-07-19: 2 g via INTRAVENOUS
  Filled 2021-07-19: qty 12.5

## 2021-07-19 MED ORDER — MORPHINE SULFATE (PF) 2 MG/ML IV SOLN
2.0000 mg | INTRAVENOUS | Status: DC | PRN
  Administered 2021-07-21: 2 mg via INTRAVENOUS
  Filled 2021-07-19: qty 1

## 2021-07-19 MED ORDER — METRONIDAZOLE 500 MG/100ML IV SOLN
500.0000 mg | Freq: Once | INTRAVENOUS | Status: AC
Start: 2021-07-19 — End: 2021-07-19
  Administered 2021-07-19: 500 mg via INTRAVENOUS
  Filled 2021-07-19: qty 100

## 2021-07-19 MED ORDER — METRONIDAZOLE 500 MG/100ML IV SOLN: 500.0000 mg | Freq: Two times a day (BID) | INTRAVENOUS | Status: DC

## 2021-07-19 MED ORDER — ONDANSETRON HCL 4 MG/2ML IJ SOLN
4.0000 mg | Freq: Four times a day (QID) | INTRAMUSCULAR | Status: DC | PRN
  Administered 2021-07-19 – 2021-07-24 (×3): 4 mg via INTRAVENOUS
  Filled 2021-07-19 (×3): qty 2

## 2021-07-19 MED ORDER — TRAZODONE HCL 50 MG PO TABS
25.0000 mg | ORAL_TABLET | Freq: Every evening | ORAL | Status: DC | PRN
  Filled 2021-07-19: qty 1

## 2021-07-19 MED ORDER — FENTANYL CITRATE PF 50 MCG/ML IJ SOSY
50.0000 ug | PREFILLED_SYRINGE | Freq: Once | INTRAMUSCULAR | Status: AC
  Administered 2021-07-19: 50 ug via INTRAVENOUS
  Filled 2021-07-19: qty 1

## 2021-07-19 MED ORDER — ONDANSETRON HCL 4 MG PO TABS
4.0000 mg | ORAL_TABLET | Freq: Four times a day (QID) | ORAL | Status: DC | PRN
  Administered 2021-07-21 – 2021-07-22 (×2): 4 mg via ORAL
  Filled 2021-07-19 (×2): qty 1

## 2021-07-19 MED ORDER — DICYCLOMINE HCL 10 MG PO CAPS
10.0000 mg | ORAL_CAPSULE | Freq: Four times a day (QID) | ORAL | Status: DC
  Administered 2021-07-19 – 2021-07-25 (×24): 10 mg via ORAL
  Filled 2021-07-19 (×28): qty 1

## 2021-07-19 MED ORDER — SODIUM CHLORIDE 0.9 % IV SOLN: INTRAVENOUS | Status: DC

## 2021-07-19 MED ORDER — LACTATED RINGERS IV BOLUS (SEPSIS)
1000.0000 mL | Freq: Once | INTRAVENOUS | Status: AC
Start: 2021-07-19 — End: 2021-07-19
  Administered 2021-07-19: 1000 mL via INTRAVENOUS

## 2021-07-19 MED ORDER — CEFTRIAXONE SODIUM 2 G IJ SOLR
2.0000 g | Freq: Once | INTRAMUSCULAR | Status: AC
  Administered 2021-07-19: 2 g via INTRAVENOUS
  Filled 2021-07-19: qty 20

## 2021-07-19 MED ORDER — LACTATED RINGERS IV BOLUS (SEPSIS)
1000.0000 mL | Freq: Once | INTRAVENOUS | Status: AC
  Administered 2021-07-19: 1000 mL via INTRAVENOUS

## 2021-07-19 MED ORDER — ONDANSETRON HCL 4 MG/2ML IJ SOLN
4.0000 mg | INTRAMUSCULAR | Status: AC
  Administered 2021-07-19: 4 mg via INTRAVENOUS
  Filled 2021-07-19: qty 2

## 2021-07-19 MED ORDER — MAGNESIUM HYDROXIDE 400 MG/5ML PO SUSP: 30.0000 mL | Freq: Every day | ORAL | Status: DC | PRN

## 2021-07-19 MED ORDER — ROSUVASTATIN CALCIUM 10 MG PO TABS
10.0000 mg | ORAL_TABLET | ORAL | Status: DC
  Administered 2021-07-22 – 2021-07-25 (×2): 10 mg via ORAL
  Filled 2021-07-19 (×3): qty 1

## 2021-07-19 MED ORDER — SODIUM CHLORIDE 0.9% FLUSH
10.0000 mL | Freq: Every day | INTRAVENOUS | Status: DC
  Administered 2021-07-19 – 2021-07-24 (×6): 10 mL

## 2021-07-19 MED ORDER — VITAMIN D 25 MCG (1000 UNIT) PO TABS
1000.0000 [IU] | ORAL_TABLET | Freq: Every day | ORAL | Status: DC
  Administered 2021-07-19 – 2021-07-25 (×7): 1000 [IU] via ORAL
  Filled 2021-07-19 (×7): qty 1

## 2021-07-19 MED ORDER — ACETAMINOPHEN 650 MG RE SUPP: 650.0000 mg | Freq: Four times a day (QID) | RECTAL | Status: DC | PRN

## 2021-07-19 MED ORDER — ACETAMINOPHEN 325 MG PO TABS
650.0000 mg | ORAL_TABLET | Freq: Four times a day (QID) | ORAL | Status: DC | PRN
Start: 2021-07-19 — End: 2021-07-25
  Administered 2021-07-21: 650 mg via ORAL
  Filled 2021-07-19: qty 2

## 2021-07-19 MED ORDER — ENOXAPARIN SODIUM 40 MG/0.4ML IJ SOSY
40.0000 mg | PREFILLED_SYRINGE | INTRAMUSCULAR | Status: DC
  Administered 2021-07-19 – 2021-07-25 (×7): 40 mg via SUBCUTANEOUS
  Filled 2021-07-19 (×7): qty 0.4

## 2021-07-19 MED ORDER — COENZYME Q10 100 MG PO CAPS: 100.0000 mg | ORAL_CAPSULE | Freq: Two times a day (BID) | ORAL | Status: DC

## 2021-07-19 NOTE — Sepsis Progress Note (Signed)
Following per sepsis protocol   

## 2021-07-19 NOTE — Plan of Care (Signed)

## 2021-07-19 NOTE — Assessment & Plan Note (Signed)
-   We will continue statin therapy. 

## 2021-07-19 NOTE — ED Notes (Signed)
Rn aware bed assigned 

## 2021-07-19 NOTE — ED Notes (Signed)
Pt complains of abd pain with nausea and some vomiting. Pt does have a drain in place in left lower abd for diverticulitis

## 2021-07-19 NOTE — H&P (Signed)
PATIENT NAME: Brianna Briggs    MR#:  [id number]  DATE OF BIRTH:  03/23/1950  DATE OF ADMISSION:  07/19/2021  PRIMARY CARE PHYSICIAN: Kirk Ruths, MD   Patient is coming from: Home  REQUESTING/REFERRING PHYSICIAN: Hinda Kehr, MD  CHIEF COMPLAINT:   Chief Complaint  Patient presents with   Abdominal Pain    HISTORY OF PRESENT ILLNESS:  Brianna Briggs is a 71 y.o. Caucasian female with medical history significant for dyslipidemia, CAD, PAD, right-sided breast cancer status postmastectomy, solitary kidney, PSVT and recurrent diverticulitis as well as diverticular abscess, who is status post perforated diverticulitis with pericolonic abscess in May of this year s/p IR placed drain.  On 06/23/2021 her abscess did drain and retracted with fistula and she presented for intra-abdominal abscess drain exchange and reposition.  Vascular surgery consult was obtained and and she is being followed by infectious disease in addition to general surgery with Dr. Lysle Pearl.  She presents to the emergency room today with left lower quadrant abdominal and rectal pain and associated dry heaves for the last 3 days.  She had a small bowel movement during the day but her last normal bowel movement was 1 week ago.  She has been off and on clear liquid diet for the last couple months.  She has been having consistent purulent drainage in her back from her left lower quadrant abdominal drain.  She admits to mild cough with her COPD without worsening or wheezing or dyspnea.  No chest pain or palpitations.  No dysuria, oliguria or hematuria or flank pain.  ED Course: When she came to the ER, heart rate was 122 with a heart respiratory to 18 and temperature 97.7 with a BP 114/101 and pulse symmetry 95% room air.  Labs revealed mild hyponatremia 134 and calcium of 8.5.  CBC showed leukocytosis of 17.1 and later 13.2 and lactic acid 1.3.  INR is 1.3 and PT 15.9  Imaging: Abdominal pelvic CT with  contrast revealed persistent diverticulitis of the sigmoid colon and the previously drained the fluid collection has resolved with a catheter in place however there is a new multiloculated air-fluid collection consistent with progressive abscess formation identified along the anterior aspect of the sigmoid colon with some inflammatory involvement of the adjacent loops of small bowel with no definitive fistulization noted.  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  The patient was given IV cefepime and Flagyl.  She will be admitted to a medical telemetry bed for further evaluation and management. PAST MEDICAL HISTORY:   Past Medical History:  Diagnosis Date   Allergic genetic state    Breast cancer, right ([place]) 1996   Mastectomy   History of chicken pox    History of colon polyps    Hyperlipidemia    Single kidney    Squamous cell carcinoma of skin 01/27/2019   Right inferior lateral thigh. KA-type  CAD, PAD, PSVT and diverticulitis as well as diverticular abscess  PAST SURGICAL HISTORY:   Past Surgical History:  Procedure Laterality Date   ABDOMINAL HYSTERECTOMY     COLONOSCOPY WITH PROPOFOL N/A 05/01/2019   Procedure: COLONOSCOPY WITH PROPOFOL;  Surgeon: Toledo, Benay Pike, MD;  Location: [facility] ENDOSCOPY;  Service: Gastroenterology;  Laterality: N/A;   ESOPHAGOGASTRODUODENOSCOPY (EGD) WITH PROPOFOL N/A 05/01/2019   Procedure: ESOPHAGOGASTRODUODENOSCOPY (EGD) WITH PROPOFOL;  Surgeon: Toledo, Benay Pike, MD;  Location: [facility] ENDOSCOPY;  Service: Gastroenterology;  Laterality: N/A;  IR CATHETER TUBE CHANGE  06/29/2021   IR RADIOLOGIST EVAL & MGMT  06/23/2021   IR RADIOLOGIST EVAL & MGMT  07/14/2021   MASTECTOMY     SHOULDER ARTHROSCOPY WITH SUBACROMIAL DECOMPRESSION, ROTATOR CUFF REPAIR AND BICEP TENDON REPAIR Left 01/13/2008   TONSILLECTOMY     TUBAL LIGATION      SOCIAL HISTORY:   Social History   Tobacco Use   Smoking status: Every Day     Packs/day: 1.50    Years: 40.00    Total pack years: 60.00    Types: Cigarettes   Smokeless tobacco: Never  Substance Use Topics   Alcohol use: No    FAMILY HISTORY:   Family History  Problem Relation Age of Onset   Cancer Father    Kidney failure Mother     DRUG ALLERGIES:   Allergies  Allergen Reactions   Amoxicillin-Pot Clavulanate Diarrhea and Rash    Other reaction(s): Unknown   Biaxin [Clarithromycin] Diarrhea    Other reaction(s): Unknown   Oxycodone Itching   Codeine Rash   Nitrofurantoin Rash    REVIEW OF SYSTEMS:   ROS As per history of present illness. All pertinent systems were reviewed above. Constitutional, HEENT, cardiovascular, respiratory, GI, GU, musculoskeletal, neuro, psychiatric, endocrine, integumentary and hematologic systems were reviewed and are otherwise negative/unremarkable except for positive findings mentioned above in the HPI.   MEDICATIONS AT HOME:   Prior to Admission medications   Medication Sig Start Date End Date Taking? Authorizing Provider  Cholecalciferol 25 MCG (1000 UT) capsule Take 1,000 Units by mouth daily. 04/27/21  Yes [provider]  Coenzyme Q10 100 MG capsule Take 100 mg by mouth 2 (two) times daily. 04/27/21  Yes [provider]  dicyclomine (BENTYL) 10 MG capsule Take 10 mg by mouth 4 (four) times daily. 05/31/21  Yes [provider]  Docusate Sodium (DSS) 100 MG CAPS Take 1-2 capsules by mouth 2 (two) times daily as needed. 07/16/21 07/26/21 Yes [provider]  lidocaine (XYLOCAINE) 2 % jelly Apply 1 Application topically as directed. 07/16/21 07/16/22 Yes [provider]  nystatin (MYCOSTATIN/NYSTOP) powder Apply topically 2 (two) times daily. 05/13/21  Yes [provider]  predniSONE (DELTASONE) 5 MG tablet Take 1 tablet by mouth daily. 05/27/21  Yes [provider]  rosuvastatin (CRESTOR) 10 MG tablet Take 10 mg by mouth 3 (three) times a week. 04/28/21  Yes  [provider]  sodium chloride flush 0.9 % SOLN injection 10 mLs by Intracatheter route 3 (three) times daily for 60 days 07/15/21 09/13/21 Yes [provider]  traMADol (ULTRAM) 50 MG tablet Take 50 mg by mouth every 6 (six) hours as needed for moderate pain. 07/16/21  Yes [provider]  VENTOLIN HFA 108 (90 Base) MCG/ACT inhaler Inhale 2 puffs into the lungs 2 (two) times daily. 04/12/21   [provider]      VITAL SIGNS:  Blood pressure 97/70, pulse (!) 105, temperature 98.2 F (36.8 C), temperature source Oral, resp. rate 17, height '5\' 2"'$  (1.575 m), weight 57.2 kg, SpO2 99 %.  PHYSICAL EXAMINATION:  Physical Exam  GENERAL:  71 y.o.-year-old Caucasian female patient lying in the bed with no acute distress.  EYES: Pupils equal, round, reactive to light and accommodation. No scleral icterus. Extraocular muscles intact.  HEENT: Head atraumatic, normocephalic. Oropharynx and nasopharynx clear.  NECK:  Supple, no jugular venous distention. No thyroid enlargement, no tenderness.  LUNGS: Normal breath sounds bilaterally, no wheezing, rales,rhonchi or crepitation.  No use of accessory muscles of respiration.  CARDIOVASCULAR: Regular rate and rhythm, S1, S2 normal. No murmurs, rubs, or gallops.  ABDOMEN: Soft, nondistended, lower abdominal tenderness without rebound tenderness guarding or rigidity.. Bowel sounds present. No organomegaly or mass.  EXTREMITIES: No pedal edema, cyanosis, or clubbing.  NEUROLOGIC: Cranial nerves II through XII are intact. Muscle strength 5/5 in all extremities. Sensation intact. Gait not checked.  PSYCHIATRIC: The patient is alert and oriented x 3.  Normal affect and good eye contact. SKIN: No obvious rash, lesion, or ulcer.   LABORATORY PANEL:   CBC Recent Labs  Lab 07/19/21 0445  WBC 13.2*  HGB 12.6  HCT 39.2  PLT 300    ------------------------------------------------------------------------------------------------------------------  Chemistries  Recent Labs  Lab 07/18/21 1944 07/19/21 0445  NA 132* 134*  K 4.2 4.1  CL 92* 100  CO2 28 27  GLUCOSE 127* 99  BUN 11 10  CREATININE 0.87 0.72  CALCIUM 9.2 8.5*  AST 19  --   ALT 12  --   ALKPHOS 75  --   BILITOT 0.9  --    ------------------------------------------------------------------------------------------------------------------  Cardiac Enzymes No results for input(s): "TROPONINI" in the last 168 hours. ------------------------------------------------------------------------------------------------------------------  RADIOLOGY:  CT ABDOMEN PELVIS W CONTRAST  Result Date: 07/19/2021 CLINICAL DATA:  Lower abdominal pain and rectal pain for several days, initial encounter EXAM: CT ABDOMEN AND PELVIS WITH CONTRAST TECHNIQUE: Multidetector CT imaging of the abdomen and pelvis was performed using the standard protocol following bolus administration of intravenous contrast. RADIATION DOSE REDUCTION: This exam was performed according to the departmental dose-optimization program which includes automated exposure control, adjustment of the mA and/or kV according to patient size and/or use of iterative reconstruction technique. CONTRAST:  74m OMNIPAQUE IOHEXOL 300 MG/ML  SOLN COMPARISON:  06/23/2021 FINDINGS: Lower chest: No acute abnormality. Hepatobiliary: No focal liver abnormality is seen. No gallstones, gallbladder wall thickening, or biliary dilatation. Pancreas: Unremarkable. No pancreatic ductal dilatation or surrounding inflammatory changes. Spleen: Normal in size without focal abnormality. Adrenals/Urinary Tract: Adrenal glands are within normal limits. 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 Right kidney is chronically atrophic. Bladder is decompressed. Scarring in the left kidney is noted. Stomach/Bowel: Changes of diverticulitis are  again seen in the sigmoid colon similar to that noted on the prior exam. Drainage catheter is again identified along side the sigmoid colon. No residual abscess is seen. The degree of inflammatory change surrounding the sigmoid has progressed in the interval from the prior exam. A mildly irregular fluid collection is now seen along the anterior aspect of the sigmoid this is best visualized on images 71 through 79 on series 6 and image number 52 of series 2. Some mild adjacent thickening of small bowel loops is noted related to the progressive inflammatory change. More proximal colon is within normal limits. The appendix is within normal limits. Small bowel is otherwise within normal limits. Stomach is unremarkable. Vascular/Lymphatic: Aortic atherosclerosis. No enlarged abdominal or pelvic lymph nodes. Reproductive: Status post hysterectomy. No adnexal masses. Other: No abdominal wall hernia or abnormality. No abdominopelvic ascites. Musculoskeletal: No acute or significant osseous findings. IMPRESSION: Persistent diverticulitis of the sigmoid colon. The previously drained fluid collection has resolved with the catheter in place. There is however a new multiloculated air-fluid collection consistent with progressive abscess formation identified along the anterior aspect of the sigmoid colon with some inflammatory involvement of adjacent loops of small bowel. No definitive fistulization is noted. Subcentimeter left adrenal mass, consistent with benign myelolipoma. No follow-up  imaging is recommended. JACR 2017 Aug; 14(8):1038-44, JCAT 2016 Mar-Apr; 40(2):194-200, Urol J 2006 Spring; 3(2):71-4. Chronic atrophy of the right kidney. Electronically Signed   By: Inez Catalina M.D.   On: 07/19/2021 02:39   DG Abdomen 1 View  Result Date: 07/18/2021 CLINICAL DATA:  Abdominal pain, history of diverticulitis EXAM: ABDOMEN - 1 VIEW COMPARISON:  06/11/2021 FINDINGS: Scattered large and small bowel gas is noted. No obstructive  changes are seen. Prior drainage catheter is again seen in the left lower quadrant stable from the prior exam. No acute bony abnormality is seen. IMPRESSION: No acute abnormality noted. Electronically Signed   By: Inez Catalina M.D.   On: 07/18/2021 20:25      IMPRESSION AND PLAN:  Assessment and Plan: * Colonic diverticular abscess - The patient will be admitted to a medical telemetry bed. - Pain management will be provided. - We will continue her on IV cefepime and Flagyl. - General surgery consult will be obtained. - I notified Dr. Peyton Najjar about the patient  Sepsis due to undetermined organism [facility]) - The patient will be hydrated with IV normal saline. - Sepsis manifested by tachycardia and leukocytosis. - We will continue her on IV Rocephin and Flagyl. - We will follow blood cultures. - General surgery consult will be obtained as mentioned above  Hyperlipidemia, mixed - We will continue statin therapy.  Chronic obstructive pulmonary disease (COPD) (HCC) - We will continue her albuterol.   DVT prophylaxis: Lovenox.  Advanced Care Planning:  Code Status: full code.  Family Communication:  The plan of care was discussed in details with the patient (and family). I answered all questions. The patient agreed to proceed with the above mentioned plan. Further management will depend upon hospital course. Disposition Plan: Back to previous home environment Consults called: General surgery. All the records are reviewed and case discussed with ED provider.  Status is: Inpatient   At the time of the admission, it appears that the appropriate admission status for this patient is inpatient.  This is judged to be reasonable and necessary in order to provide the required intensity of service to ensure the patient's safety given the presenting symptoms, physical exam findings and initial radiographic and laboratory data in the context of comorbid conditions.  The patient requires inpatient  status due to high intensity of service, high risk of further deterioration and high frequency of surveillance required.  I certify that at the time of admission, it is my clinical judgment that the patient will require inpatient hospital care extending more than 2 midnights.                            Dispo: The patient is from: Home              Anticipated d/c is to: Home              Patient currently is not medically stable to d/c.              Difficult to place patient: No  Christel Mormon M.D on 07/19/2021 at 6:01 AM  [facility]   From 7 PM-7 AM, contact night-coverage [email or web address]  CC: Primary care physician; Kirk Ruths, MD

## 2021-07-19 NOTE — Consult Note (Signed)
SURGICAL CONSULTATION NOTE   HISTORY OF PRESENT ILLNESS (HPI):  71 y.o. female presented to [facility] ED for evaluation of abdominal pain. Patient reports she has been having worsening abdominal pain for the last week.  She lives at the pain is localized in the left lower quadrant.  The pain radiates to the rectum.  Aggravating factor is eating and applying pressure on the left lower quadrant.  There has been no alleviating factors.  Initially she thought that it was constipation and she started taking stool softeners.  There was no improvement of her constipation.  She came to the ED yesterday she was found with tenderness in the left lower quadrant.  Vital signs were stable without fever.  She was found with leukocytosis of 17,000.  There was no significant electrolyte disturbance.  Normal renal function.  Lactic acid within normal limit.  She had a CT scan of the abdomen and pelvis that shows recurrent diverticulitis.  No free air or free fluid.  I compared the CT scan with the one done on June 23, 2021.  The inflammatory process has recurred and progressed to compare to last CT scan.  Upon chart review she had a episode of complicated diverticulitis in July 2021.  At that point she was recommended to have elective sigmoid colectomy but she seems to be reluctant to pursue surgery.  Now 2023 she had another episode in May with another diverticulitis with abscess treated with percutaneous drainage and antibiotic therapy.  As per patient she completed her antibiotic therapy about 4 weeks ago.  Surgery is consulted by Dr. Sidney Ace in this context for evaluation and management of recurrent diverticulitis.  PAST MEDICAL HISTORY (PMH):  Past Medical History:  Diagnosis Date   Allergic genetic state    Breast cancer, right ([place]) 1996   Mastectomy   History of chicken pox    History of colon polyps    Hyperlipidemia    Single kidney    Squamous cell carcinoma of skin 01/27/2019   Right inferior lateral  thigh. KA-type     PAST SURGICAL HISTORY [facility]):  Past Surgical History:  Procedure Laterality Date   ABDOMINAL HYSTERECTOMY     COLONOSCOPY WITH PROPOFOL N/A 05/01/2019   Procedure: COLONOSCOPY WITH PROPOFOL;  Surgeon: Toledo, Benay Pike, MD;  Location: [facility] ENDOSCOPY;  Service: Gastroenterology;  Laterality: N/A;   ESOPHAGOGASTRODUODENOSCOPY (EGD) WITH PROPOFOL N/A 05/01/2019   Procedure: ESOPHAGOGASTRODUODENOSCOPY (EGD) WITH PROPOFOL;  Surgeon: Toledo, Benay Pike, MD;  Location: [facility] ENDOSCOPY;  Service: Gastroenterology;  Laterality: N/A;   IR CATHETER TUBE CHANGE  06/29/2021   IR RADIOLOGIST EVAL & MGMT  06/23/2021   IR RADIOLOGIST EVAL & MGMT  07/14/2021   MASTECTOMY     SHOULDER ARTHROSCOPY WITH SUBACROMIAL DECOMPRESSION, ROTATOR CUFF REPAIR AND BICEP TENDON REPAIR Left 01/13/2008   TONSILLECTOMY     TUBAL LIGATION       MEDICATIONS:  Prior to Admission medications   Medication Sig Start Date End Date Taking? Authorizing Provider  Cholecalciferol 25 MCG (1000 UT) capsule Take 1,000 Units by mouth daily. 04/27/21  Yes [provider]  Coenzyme Q10 100 MG capsule Take 100 mg by mouth 2 (two) times daily. 04/27/21  Yes [provider]  dicyclomine (BENTYL) 10 MG capsule Take 10 mg by mouth 4 (four) times daily. 05/31/21  Yes [provider]  Docusate Sodium (DSS) 100 MG CAPS Take 1-2 capsules by mouth 2 (two) times daily as needed. 07/16/21 07/26/21 Yes [provider]  lidocaine (XYLOCAINE) 2 %  jelly Apply 1 Application topically as directed. 07/16/21 07/16/22 Yes [provider]  nystatin (MYCOSTATIN/NYSTOP) powder Apply topically 2 (two) times daily. 05/13/21  Yes [provider]  predniSONE (DELTASONE) 5 MG tablet Take 1 tablet by mouth daily. 05/27/21  Yes [provider]  rosuvastatin (CRESTOR) 10 MG tablet Take 10 mg by mouth 3 (three) times a week. 04/28/21  Yes [provider]  sodium chloride flush 0.9 % SOLN injection 10  mLs by Intracatheter route 3 (three) times daily for 60 days 07/15/21 09/13/21 Yes [provider]  traMADol (ULTRAM) 50 MG tablet Take 50 mg by mouth every 6 (six) hours as needed for moderate pain. 07/16/21  Yes [provider]  VENTOLIN HFA 108 (90 Base) MCG/ACT inhaler Inhale 2 puffs into the lungs 2 (two) times daily. 04/12/21   [provider]     ALLERGIES:  Allergies  Allergen Reactions   Amoxicillin-Pot Clavulanate Diarrhea and Rash    Other reaction(s): Unknown   Biaxin [Clarithromycin] Diarrhea    Other reaction(s): Unknown   Oxycodone Itching   Codeine Rash   Nitrofurantoin Rash     SOCIAL HISTORY:  Social History   Socioeconomic History   Marital status: Married    Spouse name: Not on file   Number of children: Not on file   Years of education: Not on file   Highest education level: Not on file  Occupational History   Not on file  Tobacco Use   Smoking status: Every Day    Packs/day: 1.50    Years: 40.00    Total pack years: 60.00    Types: Cigarettes   Smokeless tobacco: Never  Vaping Use   Vaping Use: Never used  Substance and Sexual Activity   Alcohol use: No   Drug use: No   Sexual activity: Yes    Partners: Male  Other Topics Concern   Not on file  Social History Narrative   Lives at home with husband   Social Determinants of Health   Financial Resource Strain: Not on file  Food Insecurity: Not on file  Transportation Needs: Not on file  Physical Activity: Not on file  Stress: Not on file  Social Connections: Not on file  Intimate Partner Violence: Not on file      FAMILY HISTORY:  Family History  Problem Relation Age of Onset   Cancer Father    Kidney failure Mother      REVIEW OF SYSTEMS:  Constitutional: denies weight loss, fever, chills, or sweats  Eyes: denies any other vision changes, history of eye injury  ENT: denies sore throat, hearing problems  Respiratory: denies shortness of breath, wheezing   Cardiovascular: denies chest pain, palpitations  Gastrointestinal: Positive abdominal pain, nausea and constipation Genitourinary: denies burning with urination or urinary frequency Musculoskeletal: denies any other joint pains or cramps  Skin: denies any other rashes or skin discolorations  Neurological: denies any other headache, dizziness, weakness  Psychiatric: denies any other depression, anxiety   All other review of systems were negative   VITAL SIGNS:  Temp:  [97.7 F (36.5 C)-98.9 F (37.2 C)] 98.9 F (37.2 C) (07/04 0716) Pulse Rate:  [80-122] 80 (07/04 0716) Resp:  [17-18] 17 (07/04 0716) BP: (91-114)/(66-101) 95/66 (07/04 0716) SpO2:  [91 %-99 %] 98 % (07/04 0716) Weight:  [57.2 kg] 57.2 kg (07/03 1941)     Height: '5\' 2"'$  (157.5 cm) Weight: 57.2 kg BMI (Calculated): 23.04   INTAKE/OUTPUT:  This shift: No intake/output  data recorded.  Last 2 shifts: '@IOLAST2SHIFTS'$ @   PHYSICAL EXAM:  Constitutional:  -- Normal body habitus  -- Awake, alert, and oriented x3  Eyes:  -- Pupils equally round and reactive to light  -- No scleral icterus  Ear, nose, and throat:  -- No jugular venous distension  Pulmonary:  -- No crackles  -- Equal breath sounds bilaterally -- Breathing non-labored at rest Cardiovascular:  -- S1, S2 present  -- No pericardial rubs Gastrointestinal:  -- Abdomen soft, nontender, non-distended, no guarding or rebound tenderness -- No abdominal masses appreciated, pulsatile or otherwise  Musculoskeletal and Integumentary:  -- Wounds: None appreciated -- Extremities: B/L UE and LE FROM, hands and feet warm, no edema  Neurologic:  -- Motor function: intact and symmetric -- Sensation: intact and symmetric   Labs:     Latest Ref Rng & Units 07/19/2021    4:45 AM 07/18/2021    7:44 PM 06/16/2021   11:58 AM  CBC  WBC 4.0 - 10.5 K/uL 13.2  17.1  11.8   Hemoglobin 12.0 - 15.0 g/dL 12.6  14.6  16.0   Hematocrit 36.0 - 46.0 % 39.2  45.2  49.6   Platelets  150 - 400 K/uL 300  388  338       Latest Ref Rng & Units 07/19/2021    4:45 AM 07/18/2021    7:44 PM 06/16/2021   11:58 AM  CMP  Glucose 70 - 99 mg/dL 99  127  121   BUN 8 - 23 mg/dL '10  11  9   '$ Creatinine 0.44 - 1.00 mg/dL 0.72  0.87  0.71   Sodium 135 - 145 mmol/L 134  132  137   Potassium 3.5 - 5.1 mmol/L 4.1  4.2  3.8   Chloride 98 - 111 mmol/L 100  92  98   CO2 22 - 32 mmol/L '27  28  29   '$ Calcium 8.9 - 10.3 mg/dL 8.5  9.2  9.1   Total Protein 6.5 - 8.1 g/dL  7.6  7.6   Total Bilirubin 0.3 - 1.2 mg/dL  0.9  0.5   Alkaline Phos 38 - 126 U/L  75  77   AST 15 - 41 U/L  19  23   ALT 0 - 44 U/L  12  13     Imaging studies:  EXAM: CT ABDOMEN AND PELVIS WITH CONTRAST   TECHNIQUE: Multidetector CT imaging of the abdomen and pelvis was performed using the standard protocol following bolus administration of intravenous contrast.   RADIATION DOSE REDUCTION: This exam was performed according to the departmental dose-optimization program which includes automated exposure control, adjustment of the mA and/or kV according to patient size and/or use of iterative reconstruction technique.   CONTRAST:  43m OMNIPAQUE IOHEXOL 300 MG/ML  SOLN   COMPARISON:  06/23/2021   FINDINGS: Lower chest: No acute abnormality.   Hepatobiliary: No focal liver abnormality is seen. No gallstones, gallbladder wall thickening, or biliary dilatation.   Pancreas: Unremarkable. No pancreatic ductal dilatation or surrounding inflammatory changes.   Spleen: Normal in size without focal abnormality.   Adrenals/Urinary Tract: Adrenal glands are within normal limits. 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 Right kidney is chronically atrophic. Bladder is decompressed. Scarring in the left kidney is noted.   Stomach/Bowel: Changes of diverticulitis are again seen in the sigmoid colon similar to that noted on the prior exam. Drainage catheter is again identified along side the sigmoid colon.  No residual  abscess is seen. The degree of inflammatory change surrounding the sigmoid has progressed in the interval from the prior exam. A mildly irregular fluid collection is now seen along the anterior aspect of the sigmoid this is best visualized on images 71 through 79 on series 6 and image number 52 of series 2. Some mild adjacent thickening of small bowel loops is noted related to the progressive inflammatory change. More proximal colon is within normal limits. The appendix is within normal limits. Small bowel is otherwise within normal limits. Stomach is unremarkable.   Vascular/Lymphatic: Aortic atherosclerosis. No enlarged abdominal or pelvic lymph nodes.   Reproductive: Status post hysterectomy. No adnexal masses.   Other: No abdominal wall hernia or abnormality. No abdominopelvic ascites.   Musculoskeletal: No acute or significant osseous findings.   IMPRESSION: Persistent diverticulitis of the sigmoid colon. The previously drained fluid collection has resolved with the catheter in place. There is however a new multiloculated air-fluid collection consistent with progressive abscess formation identified along the anterior aspect of the sigmoid colon with some inflammatory involvement of adjacent loops of small bowel. No definitive fistulization is noted.   Subcentimeter left adrenal mass, consistent with benign myelolipoma. No follow-up imaging is recommended.   JACR 2017 Aug; 14(8):1038-44, JCAT 2016 Mar-Apr; 40(2):194-200, Urol J 2006 Spring; 3(2):71-4.   Chronic atrophy of the right kidney.     Electronically Signed   By: Inez Catalina M.D.   On: 07/19/2021 02:39  Assessment/Plan:  71 y.o. female with recurrent diverticulitis, complicated by pertinent comorbidities including coronary artery disease, peripheral artery disease.  Patient with recurrent diverticulitis with progressive inflammatory process on the distal descending, proximal sigmoid colon.  There  is a small proximal abscess adjacent to the drain area.  The fact that patient was out of antibiotic for about 4 weeks I think that is reasonable to give another trial of antibiotic therapy to see if this can help with medical management to eventually proceed with partial colectomy with possible anastomosis versus jumping directly to partial colectomy with end colostomy creation.  Due to the patient abdominal pain today I think that she will benefit to continue n.p.o. with ice chips and sips with meds until there is improvement of her abdominal pain.  I agree with IV antibiotic therapy.  I agree with IV hydration.  I encourage patient to ambulate.  Keep drain in place.  All of the above findings and recommendations were discussed with the patient, and all of patient's questions were answered to her expressed satisfaction.  Arnold Long, MD

## 2021-07-19 NOTE — ED Notes (Signed)
Pt knows need for ua . 

## 2021-07-19 NOTE — Assessment & Plan Note (Addendum)
-   The patient will be hydrated with IV normal saline. - Sepsis manifested by tachycardia and leukocytosis. - We will continue her on IV Rocephin and Flagyl. - We will follow blood cultures. - General surgery consult will be obtained as mentioned above

## 2021-07-19 NOTE — ED Notes (Signed)
1 set of blood cultures drawn and sent to lab with save labels

## 2021-07-19 NOTE — Progress Notes (Signed)
Patient seen in the ER. Chart reviewed. No family at bedside. Came in with significant left lower quadrant abdominal pain. Has abdominal drain placed from  past with diverticular abscess. Finished antibiotic four weeks ago. Currently on broad-spectrum IV antibiotic and NPO. Seen by surgery Dr. Windell Moment recommendations noted. PRN pain meds.  CT abdomen pelvis shows recurrent diverticulitis with multi-focal fluid collections on the left lower quadrant.  No charge

## 2021-07-19 NOTE — Sepsis Progress Note (Signed)
Notified bedside nurse and provider of need to order blood cultures. RN had note in Epic regarding that blood culture was sent at 0447, but order was not entered.  Antibiotics given prior to blood culture obtained.

## 2021-07-19 NOTE — Assessment & Plan Note (Signed)
-   The patient will be admitted to a medical telemetry bed. - Pain management will be provided. - We will continue her on IV cefepime and Flagyl. - General surgery consult will be obtained. - I notified Dr. Peyton Najjar about the patient

## 2021-07-19 NOTE — ED Notes (Signed)
Pt in bed, pt reports some back pain, pt states that she doesn't need any med for pain at this time, pillows given, and repositioned pt.

## 2021-07-19 NOTE — Progress Notes (Signed)

## 2021-07-19 NOTE — ED Notes (Signed)
ED TO INPATIENT HANDOFF REPORT  ED Nurse Name and Phone #: Darnelle Maffucci [phone]  P Name/Age/Gender Brianna Briggs 71 y.o. female Room/Bed: ED16A/ED16A  Code Status   Code Status: Full Code  Home/SNF/Other Home Patient oriented to: self, place, time, and situation Is this baseline? Yes   Triage Complete: Triage complete  Chief Complaint Colonic diverticular abscess [K57.20]  Triage Note Pt in with co lower abd pain and rectal pain since Saturday. Pt has been taking stools softeners states has not had normal BM in weeks. Pt has recent hx of diverticular abscess with drain placement.    Allergies Allergies  Allergen Reactions   Amoxicillin-Pot Clavulanate Diarrhea and Rash    Other reaction(s): Unknown   Biaxin [Clarithromycin] Diarrhea    Other reaction(s): Unknown   Oxycodone Itching   Codeine Rash   Nitrofurantoin Rash    Level of Care/Admitting Diagnosis ED Disposition     ED Disposition  Admit   Condition  --   [place]: [place] [[id number]]  Level of Care: Telemetry Medical [[id number]]  Covid Evaluation: Asymptomatic - no recent exposure (last 10 days) testing not required  Diagnosis: Colonic diverticular abscess [[id number]]  Admitting Physician: Christel Mormon [[id number]]  Attending Physician: Christel Mormon [[id number]]  Estimated length of stay: past midnight tomorrow  Certification:: I certify this patient will need inpatient services for at least 2 midnights          B Medical/Surgery History Past Medical History:  Diagnosis Date   Allergic genetic state    Breast cancer, right ([place]) 1996   Mastectomy   History of chicken pox    History of colon polyps    Hyperlipidemia    Single kidney    Squamous cell carcinoma of skin 01/27/2019   Right inferior lateral thigh. KA-type   Past Surgical History:  Procedure Laterality Date   ABDOMINAL HYSTERECTOMY     COLONOSCOPY WITH PROPOFOL N/A 05/01/2019   Procedure: COLONOSCOPY WITH  PROPOFOL;  Surgeon: Toledo, Benay Pike, MD;  Location: [facility] ENDOSCOPY;  Service: Gastroenterology;  Laterality: N/A;   ESOPHAGOGASTRODUODENOSCOPY (EGD) WITH PROPOFOL N/A 05/01/2019   Procedure: ESOPHAGOGASTRODUODENOSCOPY (EGD) WITH PROPOFOL;  Surgeon: Toledo, Benay Pike, MD;  Location: [facility] ENDOSCOPY;  Service: Gastroenterology;  Laterality: N/A;   IR CATHETER TUBE CHANGE  06/29/2021   IR RADIOLOGIST EVAL & MGMT  06/23/2021   IR RADIOLOGIST EVAL & MGMT  07/14/2021   MASTECTOMY     SHOULDER ARTHROSCOPY WITH SUBACROMIAL DECOMPRESSION, ROTATOR CUFF REPAIR AND BICEP TENDON REPAIR Left 01/13/2008   TONSILLECTOMY     TUBAL LIGATION       A IV Location/Drains/Wounds Patient Lines/Drains/Airways Status     Active Line/Drains/Airways     Name Placement date Placement time Site Days   Peripheral IV 07/19/21 20 G Left Antecubital 07/19/21  0154  Antecubital  less than 1   Peripheral IV 07/19/21 20 G Left Forearm 07/19/21  0455  Forearm  less than 1   Midline Single Lumen 09/32/67 Left Basilic 8 cm 0 cm 12/45/80  9983  Basilic  39   Closed System Drain 1 Left;Anterior LLQ Other (Comment) 10.2 Fr. 06/29/21  1024  LLQ  20   External Urinary Catheter 07/19/21  0415  --  less than 1            Intake/Output Last 24 hours  Intake/Output Summary (Last 24 hours) at 07/19/2021 1409 Last data filed at 07/19/2021 0254 Gross per 24 hour  Intake 1100  ml  Output --  Net 1100 ml    Labs/Imaging Results for orders placed or performed during the hospital encounter of 07/19/21 (from the past 48 hour(s))  Urinalysis, Routine w reflex microscopic Urine, Unspecified Source     Status: Abnormal   Collection Time: 07/18/21  7:32 AM  Result Value Ref Range   Color, Urine YELLOW (A) YELLOW   APPearance CLEAR (A) CLEAR   Specific Gravity, Urine 1.033 (H) 1.005 - 1.030   pH 6.0 5.0 - 8.0   Glucose, UA NEGATIVE NEGATIVE mg/dL   Hgb urine dipstick NEGATIVE NEGATIVE   Bilirubin Urine NEGATIVE NEGATIVE   Ketones, ur  NEGATIVE NEGATIVE mg/dL   Protein, ur NEGATIVE NEGATIVE mg/dL   Nitrite NEGATIVE NEGATIVE   Leukocytes,Ua NEGATIVE NEGATIVE    Comment: Performed at [facility], [address] [place], [place] [address]  CBC     Status: Abnormal   Collection Time: 07/18/21  7:44 PM  Result Value Ref Range   WBC 17.1 (H) 4.0 - 10.5 K/uL   RBC 5.17 (H) 3.87 - 5.11 MIL/uL   Hemoglobin 14.6 12.0 - 15.0 g/dL   HCT 45.2 36.0 - 46.0 %   MCV 87.4 80.0 - 100.0 fL   MCH 28.2 26.0 - 34.0 pg   MCHC 32.3 30.0 - 36.0 g/dL   RDW 13.0 11.5 - 15.5 %   Platelets 388 150 - 400 K/uL   nRBC 0.0 0.0 - 0.2 %    Comment: Performed at [facility], [address] [place], [place] [address]  Comprehensive metabolic panel     Status: Abnormal   Collection Time: 07/18/21  7:44 PM  Result Value Ref Range   Sodium 132 (L) 135 - 145 mmol/L   Potassium 4.2 3.5 - 5.1 mmol/L   Chloride 92 (L) 98 - 111 mmol/L   CO2 28 22 - 32 mmol/L   Glucose, Bld 127 (H) 70 - 99 mg/dL    Comment: Glucose reference range applies only to samples taken after fasting for at least 8 hours.   BUN 11 8 - 23 mg/dL   Creatinine, Ser 0.87 0.44 - 1.00 mg/dL   Calcium 9.2 8.9 - 10.3 mg/dL   Total Protein 7.6 6.5 - 8.1 g/dL   Albumin 2.9 (L) 3.5 - 5.0 g/dL   AST 19 15 - 41 U/L   ALT 12 0 - 44 U/L   Alkaline Phosphatase 75 38 - 126 U/L   Total Bilirubin 0.9 0.3 - 1.2 mg/dL   GFR, Estimated >60 >60 mL/min    Comment: (NOTE) Calculated using the CKD-EPI Creatinine Equation (2021)    Anion gap 12 5 - 15    Comment: Performed at [facility], [address] [place], [place] [address]  Lipase, blood     Status: None   Collection Time: 07/18/21  7:44 PM  Result Value Ref Range   Lipase 20 11 - 51 U/L    Comment: Performed at [facility], [address] [place], [place] [address]  Lactic acid, plasma     Status: None   Collection Time: 07/19/21  1:52 AM  Result Value Ref Range   Lactic Acid, Venous 0.9 0.5  - 1.9 mmol/L    Comment: Performed at [facility], [address] [place],  [address]  Lactic acid, plasma     Status: None   Collection Time: 07/19/21  4:45 AM  Result Value Ref Range   Lactic Acid, Venous 1.3 0.5 -  1.9 mmol/L    Comment: Performed at [facility], [address] [place], [place] [address]  Protime-INR     Status: Abnormal   Collection Time: 07/19/21  4:45 AM  Result Value Ref Range   Prothrombin Time 15.9 (H) 11.4 - 15.2 seconds   INR 1.3 (H) 0.8 - 1.2    Comment: (NOTE) INR goal varies based on device and disease states. Performed at [facility], [address] [place], [place] [address]   Procalcitonin     Status: None   Collection Time: 07/19/21  4:45 AM  Result Value Ref Range   Procalcitonin <0.10 ng/mL    Comment:        Interpretation: PCT (Procalcitonin) <= 0.5 ng/mL: Systemic infection (sepsis) is not likely. Local bacterial infection is possible. (NOTE)       Sepsis PCT Algorithm           Lower Respiratory Tract                                      Infection PCT Algorithm    ----------------------------     ----------------------------         PCT < 0.25 ng/mL                PCT < 0.10 ng/mL          Strongly encourage             Strongly discourage   discontinuation of antibiotics    initiation of antibiotics    ----------------------------     -----------------------------       PCT 0.25 - 0.50 ng/mL            PCT 0.10 - 0.25 ng/mL               OR       >80% decrease in PCT            Discourage initiation of                                            antibiotics      Encourage discontinuation           of antibiotics    ----------------------------     -----------------------------         PCT >= 0.50 ng/mL              PCT 0.26 - 0.50 ng/mL               AND        <80% decrease in PCT             Encourage initiation of                                             antibiotics       Encourage  continuation           of antibiotics    ----------------------------     -----------------------------        PCT >= 0.50 ng/mL                  PCT >  0.50 ng/mL               AND         increase in PCT                  Strongly encourage                                      initiation of antibiotics    Strongly encourage escalation           of antibiotics                                     -----------------------------                                           PCT <= 0.25 ng/mL                                                 OR                                        > 80% decrease in PCT                                      Discontinue / Do not initiate                                             antibiotics  Performed at [facility], [address] [place], [place] [address]   CBC     Status: Abnormal   Collection Time: 07/19/21  4:45 AM  Result Value Ref Range   WBC 13.2 (H) 4.0 - 10.5 K/uL   RBC 4.43 3.87 - 5.11 MIL/uL   Hemoglobin 12.6 12.0 - 15.0 g/dL   HCT 39.2 36.0 - 46.0 %   MCV 88.5 80.0 - 100.0 fL   MCH 28.4 26.0 - 34.0 pg   MCHC 32.1 30.0 - 36.0 g/dL   RDW 12.9 11.5 - 15.5 %   Platelets 300 150 - 400 K/uL   nRBC 0.0 0.0 - 0.2 %    Comment: Performed at [facility], [address], [place], [place] [address]  Brain natriuretic peptide     Status: None   Collection Time: 07/19/21  4:45 AM  Result Value Ref Range   B Natriuretic Peptide 80.9 0.0 - 100.0 pg/mL    Comment: Performed at [facility], [address] [place], [place] [address]  Basic metabolic panel     Status: Abnormal   Collection Time: 07/19/21  4:45 AM  Result Value Ref Range   Sodium 134 (L) 135 - 145 mmol/L   Potassium 4.1 3.5 - 5.1 mmol/L   Chloride 100 98 - 111 mmol/L   CO2  27 22 - 32 mmol/L   Glucose, Bld 99 70 - 99 mg/dL    Comment: Glucose reference range applies only to samples taken after fasting for at least 8 hours.   BUN 10 8 - 23 mg/dL    Creatinine, Ser 0.72 0.44 - 1.00 mg/dL   Calcium 8.5 (L) 8.9 - 10.3 mg/dL   GFR, Estimated >60 >60 mL/min    Comment: (NOTE) Calculated using the CKD-EPI Creatinine Equation (2021)    Anion gap 7 5 - 15    Comment: Performed at [facility] [address] [place], [place] [address]  Cortisol-am, blood     Status: None   Collection Time: 07/19/21  7:18 AM  Result Value Ref Range   Cortisol - AM 17.4 6.7 - 22.6 ug/dL    Comment: Performed at [facility] [address], [place], [place] [address]   CT ABDOMEN PELVIS W CONTRAST  Result Date: 07/19/2021 CLINICAL DATA:  Lower abdominal pain and rectal pain for several days, initial encounter EXAM: CT ABDOMEN AND PELVIS WITH CONTRAST TECHNIQUE: Multidetector CT imaging of the abdomen and pelvis was performed using the standard protocol following bolus administration of intravenous contrast. RADIATION DOSE REDUCTION: This exam was performed according to the departmental dose-optimization program which includes automated exposure control, adjustment of the mA and/or kV according to patient size and/or use of iterative reconstruction technique. CONTRAST:  57m OMNIPAQUE IOHEXOL 300 MG/ML  SOLN COMPARISON:  06/23/2021 FINDINGS: Lower chest: No acute abnormality. Hepatobiliary: No focal liver abnormality is seen. No gallstones, gallbladder wall thickening, or biliary dilatation. Pancreas: Unremarkable. No pancreatic ductal dilatation or surrounding inflammatory changes. Spleen: Normal in size without focal abnormality. Adrenals/Urinary Tract: Adrenal glands are within normal limits. 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 Right kidney is chronically atrophic. Bladder is decompressed. Scarring in the left kidney is noted. Stomach/Bowel: Changes of diverticulitis are again seen in the sigmoid colon similar to that noted on the prior exam. Drainage catheter is again identified along side the sigmoid colon. No residual abscess is seen.  The degree of inflammatory change surrounding the sigmoid has progressed in the interval from the prior exam. A mildly irregular fluid collection is now seen along the anterior aspect of the sigmoid this is best visualized on images 71 through 79 on series 6 and image number 52 of series 2. Some mild adjacent thickening of small bowel loops is noted related to the progressive inflammatory change. More proximal colon is within normal limits. The appendix is within normal limits. Small bowel is otherwise within normal limits. Stomach is unremarkable. Vascular/Lymphatic: Aortic atherosclerosis. No enlarged abdominal or pelvic lymph nodes. Reproductive: Status post hysterectomy. No adnexal masses. Other: No abdominal wall hernia or abnormality. No abdominopelvic ascites. Musculoskeletal: No acute or significant osseous findings. IMPRESSION: Persistent diverticulitis of the sigmoid colon. The previously drained fluid collection has resolved with the catheter in place. There is however a new multiloculated air-fluid collection consistent with progressive abscess formation identified along the anterior aspect of the sigmoid colon with some inflammatory involvement of adjacent loops of small bowel. No definitive fistulization is noted. Subcentimeter left adrenal mass, consistent with benign myelolipoma. No follow-up imaging is recommended. JACR 2017 Aug; 14(8):1038-44, JCAT 2016 Mar-Apr; 40(2):194-200, Urol J 2006 Spring; 3(2):71-4. Chronic atrophy of the right kidney. Electronically Signed   By: MInez CatalinaM.D.   On: 07/19/2021 02:39   DG Abdomen 1 View  Result Date: 07/18/2021 CLINICAL DATA:  Abdominal pain, history of diverticulitis  EXAM: ABDOMEN - 1 VIEW COMPARISON:  06/11/2021 FINDINGS: Scattered large and small bowel gas is noted. No obstructive changes are seen. Prior drainage catheter is again seen in the left lower quadrant stable from the prior exam. No acute bony abnormality is seen. IMPRESSION: No acute  abnormality noted. Electronically Signed   By: Inez Catalina M.D.   On: 07/18/2021 20:25    Pending Labs Unresulted Labs (From admission, onward)     Start     Ordered   07/26/21 0500  Creatinine, serum  (enoxaparin (LOVENOX)    CrCl >/= 30 ml/min)  Weekly,   R     Comments: while on enoxaparin therapy    07/19/21 0401   07/19/21 0634  Blood culture (routine x 2)  BLOOD CULTURE X 2,   R      07/19/21 0633            Vitals/Pain Today's Vitals   07/19/21 0800 07/19/21 0930 07/19/21 1230 07/19/21 1320  BP: '94/66 92/64 93/61 '$ 99/62  Pulse: 74 85 83 81  Resp: '19 14 16 17  '$ Temp:    98.1 F (36.7 C)  TempSrc:    Oral  SpO2: 99% 98% 99% 98%  Weight:      Height:      PainSc:    5     Isolation Precautions No active isolations  Medications Medications  enoxaparin (LOVENOX) injection 40 mg (40 mg Subcutaneous Given 07/19/21 0729)  0.9 %  sodium chloride infusion ( Intravenous New Bag/Given 07/19/21 0728)  acetaminophen (TYLENOL) tablet 650 mg (has no administration in time range)    Or  acetaminophen (TYLENOL) suppository 650 mg (has no administration in time range)  traZODone (DESYREL) tablet 25 mg (has no administration in time range)  magnesium hydroxide (MILK OF MAGNESIA) suspension 30 mL (has no administration in time range)  ondansetron (ZOFRAN) tablet 4 mg (has no administration in time range)    Or  ondansetron (ZOFRAN) injection 4 mg (has no administration in time range)  traMADol (ULTRAM) tablet 50 mg (has no administration in time range)  rosuvastatin (CRESTOR) tablet 10 mg (has no administration in time range)  predniSONE (DELTASONE) tablet 5 mg (5 mg Oral Given 07/19/21 0946)  dicyclomine (BENTYL) capsule 10 mg (10 mg Oral Given 07/19/21 1317)  cholecalciferol (VITAMIN D3) tablet 1,000 Units (1,000 Units Oral Given 07/19/21 0944)  albuterol (PROVENTIL) (2.5 MG/3ML) 0.083% nebulizer solution 2.5 mg (2.5 mg Inhalation Given 07/19/21 0948)  morphine (PF) 2 MG/ML injection 2  mg (has no administration in time range)  cefTRIAXone (ROCEPHIN) 2 g in sodium chloride 0.9 % 100 mL IVPB (has no administration in time range)  metroNIDAZOLE (FLAGYL) IVPB 500 mg (has no administration in time range)  lactated ringers bolus 1,000 mL (0 mLs Intravenous Stopped 07/19/21 0254)  ondansetron (ZOFRAN) injection 4 mg (4 mg Intravenous Given 07/19/21 0159)  fentaNYL (SUBLIMAZE) injection 50 mcg (50 mcg Intravenous Given 07/19/21 0200)  ceFEPIme (MAXIPIME) 2 g in sodium chloride 0.9 % 100 mL IVPB (0 g Intravenous Stopped 07/19/21 0254)    And  metroNIDAZOLE (FLAGYL) IVPB 500 mg (0 mg Intravenous Stopped 07/19/21 0405)  iohexol (OMNIPAQUE) 300 MG/ML solution 80 mL (80 mLs Intravenous Contrast Given 07/19/21 0209)  lidocaine (XYLOCAINE) 2 % viscous mouth solution 15 mL (15 mLs Mouth/Throat Given by Other 07/19/21 0455)  lactated ringers bolus 1,000 mL (0 mLs Intravenous Stopped 07/19/21 0458)  cefTRIAXone (ROCEPHIN) 2 g in sodium chloride 0.9 % 100 mL IVPB (0 g  Intravenous Stopped 07/19/21 1313)    Mobility walks Moderate fall risk      R Recommendations: See Admitting Provider Note  Report given to:   Additional Notes:

## 2021-07-19 NOTE — ED Provider Notes (Signed)
[facility] Provider Note    Event Date/Time   First MD Initiated Contact with Patient 07/19/21 0131     (approximate)   History   Abdominal Pain   HPI  Brianna Briggs is a 71 y.o. female whose medical history includes a relatively recent (approximately 1 month ago) diagnosis of complicated diverticulitis with abscess formation which required a drain placement which is still in place.  She is followed by Dr. Lysle Pearl.  She reports that after a hospitalization, she was discharged with a percutaneous drain and a PICC line and she took a full course of outpatient antibiotics as instructed by Dr. Steva Ready.  She presents tonight for gradually worsening diarrhea and/or constipation, severe abdominal pain, nausea and vomiting, and inability to tolerate oral intake.  She says she feels very fatigued and weak.  The pain is a constant dull ache in her lower abdomen but is accompanied with intermittent waves of severe pain.  She said that occasionally she will pass some loose stool but at other times it feels like she has hard stool in her rectum that she cannot get out.  She has had no fever of which she is aware and she denies chest pain or shortness of breath.  She reports feeling very dehydrated.     Physical Exam   Triage Vital Signs: ED Triage Vitals [07/18/21 1941]  Enc Vitals Group     BP (!) 114/101     Pulse Rate (!) 122     Resp 18     Temp 97.7 F (36.5 C)     Temp Source Oral     SpO2 95 %     Weight 57.2 kg (126 lb)     Height 1.575 m ('5\' 2"'$ )     Head Circumference      Peak Flow      Pain Score 10     Pain Loc      Pain Edu?      Excl. in [place]?     Most recent vital signs: Vitals:   07/18/21 1941 07/19/21 0130  BP: (!) 114/101 91/66  Pulse: (!) 122 (!) 104  Resp: 18   Temp: 97.7 F (36.5 C)   SpO2: 95% 91%     General: Awake, no distress.  Ill-appearing but nontoxic. CV:  Good peripheral perfusion.  Tachycardia, regular  rhythm. Resp:  Normal effort.  Lungs are clear to auscultation bilaterally. Abd:  Tenderness to palpation throughout lower abdomen but nonperitoneal.  Percutaneous drain is still in place, draining creamy appearing liquid. Other:  Normal mentation.   ED Results / Procedures / Treatments   Labs (all labs ordered are listed, but only abnormal results are displayed) Labs Reviewed  CBC - Abnormal; Notable for the following components:      Result Value   WBC 17.1 (*)    RBC 5.17 (*)    All other components within normal limits  COMPREHENSIVE METABOLIC PANEL - Abnormal; Notable for the following components:   Sodium 132 (*)    Chloride 92 (*)    Glucose, Bld 127 (*)    Albumin 2.9 (*)    All other components within normal limits  LIPASE, BLOOD  URINALYSIS, ROUTINE W REFLEX MICROSCOPIC  LACTIC ACID, PLASMA  LACTIC ACID, PLASMA     RADIOLOGY See hospital course for details: CT of the abdomen and pelvis shows improved prior area of diverticulitis, but now with new area of diverticulitis and developing abscess.  PROCEDURES:  Critical Care performed: Yes, see critical care procedure note(s)  .Critical Care  Performed by: Hinda Kehr, MD Authorized by: Hinda Kehr, MD   Critical care provider statement:    Critical care time (minutes):  45   Critical care time was exclusive of:  Separately billable procedures and treating other patients   Critical care was necessary to treat or prevent imminent or life-threatening deterioration of the following conditions:  Sepsis   Critical care was time spent personally by me on the following activities:  Development of treatment plan with patient or surrogate, evaluation of patient's response to treatment, examination of patient, obtaining history from patient or surrogate, ordering and performing treatments and interventions, ordering and review of laboratory studies, ordering and review of radiographic studies, pulse oximetry,  re-evaluation of patient's condition and review of old charts .1-3 Lead EKG Interpretation  Performed by: Hinda Kehr, MD Authorized by: Hinda Kehr, MD     Interpretation: abnormal     ECG rate:  110   ECG rate assessment: tachycardic     Rhythm: sinus tachycardia     Ectopy: none     Conduction: normal   Fecal disimpaction  Date/Time: 07/19/2021 5:00 AM  Performed by: Hinda Kehr, MD Authorized by: Hinda Kehr, MD  Consent: Verbal consent obtained. Risks and benefits: risks, benefits and alternatives were discussed Consent given by: patient Patient identity confirmed: verbally with patient Local anesthesia used: yes  Anesthesia: Local anesthesia used: yes Local Anesthetic: topical anesthetic  Sedation: Patient sedated: no  Patient tolerance: patient tolerated the procedure well with no immediate complications Comments: No large volume stool found in rectal vault      MEDICATIONS ORDERED IN ED: Medications  lactated ringers bolus 1,000 mL (has no administration in time range)  ondansetron (ZOFRAN) injection 4 mg (has no administration in time range)  fentaNYL (SUBLIMAZE) injection 50 mcg (has no administration in time range)    IMPRESSION / MDM / [place] / ED COURSE  I reviewed the triage vital signs and the nursing notes.                              Differential diagnosis includes, but is not limited to, diverticulitis, new abscess formation, SBO/ileus, constipation with fecal impaction, C. difficile infection, other nonspecific viral gastroenteritis.  Patient's presentation is most consistent with acute presentation with potential threat to life or bodily function.   The patient is on the cardiac monitor to evaluate for evidence of arrhythmia and/or significant heart rate changes.  Labs ordered initially include comprehensive metabolic panel, CBC, urinalysis, lipase.  Results are notable for essentially normal metabolic panel but a  leukocytosis of about 17.  I ordered a lactic acid which is pending.  I ordered "possible sepsis" including the lactic acid and 1 L LR IV bolus.  Given my concern for recurrent infection given her tachycardia and leukocytosis, I ordered empiric antibiotics of cefepime 2 g IV and metronidazole 500 mg IV given her reported allergy to penicillins.  I ordered a CT of the abdomen and pelvis with IV contrast to further evaluate.  I also told her that we would perform a fecal disimpaction to see if that provides her with some relief.  Due to another reported allergy rather than morphine I will give her fentanyl 50 mcg IV along with Zofran 4 mg IV.  Patient and her husband understand and agree with the plan.   Clinical Course  as of 07/19/21 0828  Tue Jul 19, 2021  0231 Lactic Acid, Venous: 0.9 Reassuring lactic acid [CF]  0318 CT ABDOMEN PELVIS W CONTRAST I viewed and interpreted the patient's CT scan of the abdomen pelvis.  She has an area of inflammation and infection.  Radiology report confirms a new area of diverticulitis with abscess formation.  I updated the patient.  On circumstances, especially since she is unable to tolerate any oral intake, has worsening pain, and meet sepsis criteria, I will admit her for further evaluation as she may need additional IR intervention.  She meets sepsis criteria and I have activated code sepsis and am giving additional IV fluids to meet the 30 mL/kg IV bolus.   [CF]  0335 Discussed case by phone with Dr. Sidney Ace with the hospitalist service.  He will admit. [CF]    Clinical Course User Index [CF] Hinda Kehr, MD     FINAL CLINICAL IMPRESSION(S) / ED DIAGNOSES   Final diagnoses:  Sepsis, due to unspecified organism, unspecified whether acute organ dysfunction present [facility])  Colonic diverticular abscess  Diverticulitis     Rx / DC Orders   ED Discharge Orders     None        Note:  This document was prepared using Dragon voice recognition software  and may include unintentional dictation errors.   Hinda Kehr, MD 07/19/21 [phone]

## 2021-07-19 NOTE — ED Notes (Signed)
Repeat lactic not needed per Dr. Karma Greaser.

## 2021-07-19 NOTE — Assessment & Plan Note (Signed)
-   We will continue her albuterol. 

## 2021-07-19 NOTE — Progress Notes (Signed)
CODE SEPSIS - PHARMACY COMMUNICATION  **Broad Spectrum Antibiotics should be administered within 1 hour of Sepsis diagnosis**  Time Code Sepsis Called/Page Received: 7/4 @ 6222  Antibiotics Ordered: cefepime, metronidazole   Time of 1st antibiotic administration:   cefepime 2 gm IV X 1 on 7/4 @ 0205  Additional action taken by pharmacy:   If necessary, Name of Provider/Nurse Contacted:     Abbagail Scaff D ,PharmD Clinical Pharmacist  07/19/2021  3:39 AM

## 2021-07-19 NOTE — ED Notes (Signed)
Pt in bed, pt reports abd pain, pt also states that she is cold, warm blanket given.  Pt has no other requests at this time.

## 2021-07-20 ENCOUNTER — Encounter: Payer: Self-pay | Admitting: Family Medicine

## 2021-07-20 DIAGNOSIS — K5720 Diverticulitis of large intestine with perforation and abscess without bleeding: Secondary | ICD-10-CM | POA: Diagnosis not present

## 2021-07-20 MED ORDER — ALBUTEROL SULFATE (2.5 MG/3ML) 0.083% IN NEBU: 2.5000 mg | INHALATION_SOLUTION | Freq: Four times a day (QID) | RESPIRATORY_TRACT | Status: DC | PRN

## 2021-07-20 NOTE — Plan of Care (Signed)

## 2021-07-20 NOTE — Progress Notes (Signed)
PROGRESS NOTE    Brianna Briggs  GBT:[id number] DOB: 05-16-1950 DOA: 07/19/2021 PCP: Kirk Ruths, MD    Brief Narrative:   71 year old with history of coronary artery disease, dyslipidemia, solitary kidney and recurrent diverticulitis and diverticular abscess who presently has a drain placed on her left lower quadrant present to the emergency room with left lower quadrant abdominal pain, rectal pain and associated dry heaves for 3 days.  Initially she had diarrhea, she took Imodium and started having more rectal pain with constipation.  In the emergency room hemodynamically stable.  Leukocytes 17.1.  Abnormal CT scan with persistent diverticulitis of the sigmoid colon, previously drained fluid collection with catheter in place, new multiloculated air-fluid collection along the anterior aspect of the sigmoid colon with some inflammatory changes.  Seen by surgery and admitted to hospital.  Assessment & Plan:   Perforated diverticulitis, acute recurrent diverticular abscess with abdominal pain: N.p.o. with ice chips.  Rocephin and Flagyl.  IV fluids. Adequate pain medications.  Mobility. Flush drain as instructed by IR. Surgery following, anticipating conservative management along with interval colectomy but will need very close monitoring in the hospital for any complications. May benefit with another drain by IR, will defer to surgery.  COPD: Stable.  On nocturnal oxygen.  On maintenance prednisone ,continued.  Hyperlipidemia: Stable on a statin therapy.   DVT prophylaxis: enoxaparin (LOVENOX) injection 40 mg Start: 07/19/21 0800   Code Status: Full code Family Communication: None Disposition Plan: Status is: Inpatient Remains inpatient appropriate because: IV antibiotics.  Possible surgical procedure.     Consultants:  General surgery  Procedures:  None  Antimicrobials:  Rocephin and Flagyl 7/4---   Subjective: Patient seen and examined.  Mild lower quadrant  abdominal pain present.  Denies any fever or chills overnight.  Objective: Vitals:   07/19/21 1619 07/19/21 2030 07/20/21 0400 07/20/21 0819  BP: (!) 88/60 (!) 85/45 (!) 104/52 (!) 105/58  Pulse: 71 82 83 91  Resp: '16 16 14 16  '$ Temp: 97.9 F (36.6 C) 98 F (36.7 C) 98.3 F (36.8 C) 98.9 F (37.2 C)  TempSrc:  Oral  Oral  SpO2: 96% 100% 95% 93%  Weight:      Height:        Intake/Output Summary (Last 24 hours) at 07/20/2021 1357 Last data filed at 07/20/2021 1306 Gross per 24 hour  Intake 3147.88 ml  Output 845 ml  Net 2302.88 ml   Filed Weights   07/18/21 1941  Weight: 57.2 kg    Examination:  General exam: Appears anxious.  Not in any distress at rest. Respiratory system: No added sounds.  On 2 L oxygen. Cardiovascular system: S1 & S2 heard, RRR.  Gastrointestinal system: Soft.  Mildly tender left lower quadrant. Percutaneous drain with purulent drainage, free-flowing. Central nervous system: Alert and oriented. No focal neurological deficits. Extremities: Symmetric 5 x 5 power. Skin: No rashes, lesions or ulcers Psychiatry: Judgement and insight appear normal.  Anxious.    Data Reviewed: I have personally reviewed following labs and imaging studies  CBC: Recent Labs  Lab 07/18/21 1944 07/19/21 0445  WBC 17.1* 13.2*  HGB 14.6 12.6  HCT 45.2 39.2  MCV 87.4 88.5  PLT 388 710   Basic Metabolic Panel: Recent Labs  Lab 07/18/21 1944 07/19/21 0445  NA 132* 134*  K 4.2 4.1  CL 92* 100  CO2 28 27  GLUCOSE 127* 99  BUN 11 10  CREATININE 0.87 0.72  CALCIUM 9.2 8.5*   GFR:  Estimated Creatinine Clearance: 51 mL/min (by C-G formula based on SCr of 0.72 mg/dL). Liver Function Tests: Recent Labs  Lab 07/18/21 1944  AST 19  ALT 12  ALKPHOS 75  BILITOT 0.9  PROT 7.6  ALBUMIN 2.9*   Recent Labs  Lab 07/18/21 1944  LIPASE 20   No results for input(s): "AMMONIA" in the last 168 hours. Coagulation Profile: Recent Labs  Lab 07/19/21 0445  INR 1.3*    Cardiac Enzymes: No results for input(s): "CKTOTAL", "CKMB", "CKMBINDEX", "TROPONINI" in the last 168 hours. BNP (last 3 results) No results for input(s): "PROBNP" in the last 8760 hours. HbA1C: No results for input(s): "HGBA1C" in the last 72 hours. CBG: No results for input(s): "GLUCAP" in the last 168 hours. Lipid Profile: No results for input(s): "CHOL", "HDL", "LDLCALC", "TRIG", "CHOLHDL", "LDLDIRECT" in the last 72 hours. Thyroid Function Tests: No results for input(s): "TSH", "T4TOTAL", "FREET4", "T3FREE", "THYROIDAB" in the last 72 hours. Anemia Panel: No results for input(s): "VITAMINB12", "FOLATE", "FERRITIN", "TIBC", "IRON", "RETICCTPCT" in the last 72 hours. Sepsis Labs: Recent Labs  Lab 07/19/21 0152 07/19/21 0445  PROCALCITON  --  <0.10  LATICACIDVEN 0.9 1.3    Recent Results (from the past 240 hour(s))  Blood culture (routine x 2)     Status: None (Preliminary result)   Collection Time: 07/19/21  4:46 AM   Specimen: BLOOD  Result Value Ref Range Status   Specimen Description BLOOD LEFT FA  Final   Special Requests   Final    BOTTLES DRAWN AEROBIC AND ANAEROBIC Blood Culture adequate volume   Culture   Final    NO GROWTH < 24 HOURS Performed at [facility], [address] [place], [place] [address]    Report Status PENDING  Incomplete  Blood culture (routine x 2)     Status: None (Preliminary result)   Collection Time: 07/19/21  7:18 AM   Specimen: BLOOD  Result Value Ref Range Status   Specimen Description BLOOD BLOOD LEFT HAND  Final   Special Requests   Final    BOTTLES DRAWN AEROBIC AND ANAEROBIC Blood Culture adequate volume   Culture   Final    NO GROWTH < 24 HOURS Performed at [facility], [address], [place], [place] [address]    Report Status PENDING  Incomplete         Radiology Studies: CT ABDOMEN PELVIS W CONTRAST  Result Date: 07/19/2021 CLINICAL DATA:  Lower abdominal pain and rectal pain for several  days, initial encounter EXAM: CT ABDOMEN AND PELVIS WITH CONTRAST TECHNIQUE: Multidetector CT imaging of the abdomen and pelvis was performed using the standard protocol following bolus administration of intravenous contrast. RADIATION DOSE REDUCTION: This exam was performed according to the departmental dose-optimization program which includes automated exposure control, adjustment of the mA and/or kV according to patient size and/or use of iterative reconstruction technique. CONTRAST:  46m OMNIPAQUE IOHEXOL 300 MG/ML  SOLN COMPARISON:  06/23/2021 FINDINGS: Lower chest: No acute abnormality. Hepatobiliary: No focal liver abnormality is seen. No gallstones, gallbladder wall thickening, or biliary dilatation. Pancreas: Unremarkable. No pancreatic ductal dilatation or surrounding inflammatory changes. Spleen: Normal in size without focal abnormality. Adrenals/Urinary Tract: Adrenal glands are within normal limits. 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 Right kidney is chronically atrophic. Bladder is decompressed. Scarring in the left kidney is noted. Stomach/Bowel: Changes of diverticulitis are again seen in the sigmoid colon similar to that noted on the prior exam. Drainage catheter is again identified  along side the sigmoid colon. No residual abscess is seen. The degree of inflammatory change surrounding the sigmoid has progressed in the interval from the prior exam. A mildly irregular fluid collection is now seen along the anterior aspect of the sigmoid this is best visualized on images 71 through 79 on series 6 and image number 52 of series 2. Some mild adjacent thickening of small bowel loops is noted related to the progressive inflammatory change. More proximal colon is within normal limits. The appendix is within normal limits. Small bowel is otherwise within normal limits. Stomach is unremarkable. Vascular/Lymphatic: Aortic atherosclerosis. No enlarged abdominal or pelvic lymph nodes.  Reproductive: Status post hysterectomy. No adnexal masses. Other: No abdominal wall hernia or abnormality. No abdominopelvic ascites. Musculoskeletal: No acute or significant osseous findings. IMPRESSION: Persistent diverticulitis of the sigmoid colon. The previously drained fluid collection has resolved with the catheter in place. There is however a new multiloculated air-fluid collection consistent with progressive abscess formation identified along the anterior aspect of the sigmoid colon with some inflammatory involvement of adjacent loops of small bowel. No definitive fistulization is noted. Subcentimeter left adrenal mass, consistent with benign myelolipoma. No follow-up imaging is recommended. JACR 2017 Aug; 14(8):1038-44, JCAT 2016 Mar-Apr; 40(2):194-200, Urol J 2006 Spring; 3(2):71-4. Chronic atrophy of the right kidney. Electronically Signed   By: Inez Catalina M.D.   On: 07/19/2021 02:39   DG Abdomen 1 View  Result Date: 07/18/2021 CLINICAL DATA:  Abdominal pain, history of diverticulitis EXAM: ABDOMEN - 1 VIEW COMPARISON:  06/11/2021 FINDINGS: Scattered large and small bowel gas is noted. No obstructive changes are seen. Prior drainage catheter is again seen in the left lower quadrant stable from the prior exam. No acute bony abnormality is seen. IMPRESSION: No acute abnormality noted. Electronically Signed   By: Inez Catalina M.D.   On: 07/18/2021 20:25        Scheduled Meds:  cholecalciferol  1,000 Units Oral Daily   dicyclomine  10 mg Oral QID   enoxaparin (LOVENOX) injection  40 mg Subcutaneous Q24H   predniSONE  5 mg Oral Daily   rosuvastatin  10 mg Oral Once per day on Mon Wed Fri   sodium chloride flush  10 mL Intracatheter QHS   Continuous Infusions:  sodium chloride 100 mL/hr at 07/20/21 1340   cefTRIAXone (ROCEPHIN)  IV 2 g (07/20/21 1339)   metronidazole 500 mg (07/20/21 0356)     LOS: 1 day    Time spent: 35 minutes    Barb Merino, MD [facility] Pager  [phone]

## 2021-07-20 NOTE — Progress Notes (Signed)
Subjective:  CC: Brianna Briggs is a 71 y.o. female  Hospital stay day 1,   recurrent diverticulitis  HPI: No acute issues overnight.  ROS:  General: Denies weight loss, weight gain, fatigue, fevers, chills, and night sweats. Heart: Denies chest pain, palpitations, racing heart, irregular heartbeat, leg pain or swelling, and decreased activity tolerance. Respiratory: Denies breathing difficulty, shortness of breath, wheezing, cough, and sputum. GI: Denies change in appetite, heartburn, nausea, vomiting, constipation, diarrhea, and blood in stool. GU: Denies difficulty urinating, pain with urinating, urgency, frequency, blood in urine.   Objective:   Temp:  [98 F (36.7 C)-98.9 F (37.2 C)] 98.6 F (37 C) (07/05 1653) Pulse Rate:  [73-91] 77 (07/05 1738) Resp:  [14-18] 18 (07/05 1653) BP: (81-105)/(45-58) 98/48 (07/05 1738) SpO2:  [93 %-100 %] 96 % (07/05 1738)     Height: '5\' 2"'$  (157.5 cm) Weight: 57.2 kg BMI (Calculated): 23.04   Intake/Output this shift:   Intake/Output Summary (Last 24 hours) at 07/20/2021 1738 Last data filed at 07/20/2021 1500 Gross per 24 hour  Intake 3307.1 ml  Output 845 ml  Net 2462.1 ml    Constitutional :  alert, cooperative, appears stated age, and no distress  Respiratory:  clear to auscultation bilaterally  Cardiovascular:  regular rate and rhythm  Gastrointestinal: Soft, no guarding, suprapubic pain, with drain in place, still has purulent output .   Skin: Cool and moist.   Psychiatric: Normal affect, non-agitated, not confused       LABS:     Latest Ref Rng & Units 07/19/2021    4:45 AM 07/18/2021    7:44 PM 06/16/2021   11:58 AM  CMP  Glucose 70 - 99 mg/dL 99  127  121   BUN 8 - 23 mg/dL '10  11  9   '$ Creatinine 0.44 - 1.00 mg/dL 0.72  0.87  0.71   Sodium 135 - 145 mmol/L 134  132  137   Potassium 3.5 - 5.1 mmol/L 4.1  4.2  3.8   Chloride 98 - 111 mmol/L 100  92  98   CO2 22 - 32 mmol/L '27  28  29   '$ Calcium 8.9 - 10.3 mg/dL 8.5  9.2   9.1   Total Protein 6.5 - 8.1 g/dL  7.6  7.6   Total Bilirubin 0.3 - 1.2 mg/dL  0.9  0.5   Alkaline Phos 38 - 126 U/L  75  77   AST 15 - 41 U/L  19  23   ALT 0 - 44 U/L  12  13       Latest Ref Rng & Units 07/19/2021    4:45 AM 07/18/2021    7:44 PM 06/16/2021   11:58 AM  CBC  WBC 4.0 - 10.5 K/uL 13.2  17.1  11.8   Hemoglobin 12.0 - 15.0 g/dL 12.6  14.6  16.0   Hematocrit 36.0 - 46.0 % 39.2  45.2  49.6   Platelets 150 - 400 K/uL 300  388  338     RADS: N/a Assessment:   Extensive discussion again of r/b/a of proceeding with surgery at this time.  Pt still wants to defer and continue to abx therapy to cool off area prior to surgical intervention.  She has some concerns at home that make it not ideal for recovery from surgery, also still very hesitant on likelihood of requiring ostomy if we proceed at this time.  Discussed possible second opinion from colorectal surgeon, which they did  not specifically request at this time.  cont IV abx.  ID consulted.  clears ok for tonight.    labs/images/medications/previous chart entries reviewed personally and relevant changes/updates noted above.

## 2021-07-21 DIAGNOSIS — K632 Fistula of intestine: Secondary | ICD-10-CM

## 2021-07-21 DIAGNOSIS — K5720 Diverticulitis of large intestine with perforation and abscess without bleeding: Secondary | ICD-10-CM | POA: Diagnosis not present

## 2021-07-21 MED ORDER — PANTOPRAZOLE SODIUM 20 MG PO TBEC
20.0000 mg | DELAYED_RELEASE_TABLET | Freq: Every day | ORAL | Status: DC
Start: 2021-07-21 — End: 2021-07-25
  Administered 2021-07-21 – 2021-07-25 (×5): 20 mg via ORAL
  Filled 2021-07-21 (×5): qty 1

## 2021-07-21 NOTE — Plan of Care (Signed)

## 2021-07-21 NOTE — Progress Notes (Signed)
Subjective:  CC: Brianna Briggs is a 71 y.o. female  Hospital stay day 2,   recurrent diverticulitis  HPI: No acute issues overnight. Tolerating clears  ROS:  General: Denies weight loss, weight gain, fatigue, fevers, chills, and night sweats. Heart: Denies chest pain, palpitations, racing heart, irregular heartbeat, leg pain or swelling, and decreased activity tolerance. Respiratory: Denies breathing difficulty, shortness of breath, wheezing, cough, and sputum. GI: Denies change in appetite, heartburn, nausea, vomiting, constipation, diarrhea, and blood in stool. GU: Denies difficulty urinating, pain with urinating, urgency, frequency, blood in urine.   Objective:   Temp:  [97.9 F (36.6 C)-98.6 F (37 C)] 97.9 F (36.6 C) (07/06 0742) Pulse Rate:  [72-80] 72 (07/06 0742) Resp:  [17-20] 18 (07/06 0742) BP: (78-98)/(48-61) 95/61 (07/06 0742) SpO2:  [93 %-97 %] 97 % (07/06 0742)     Height: '5\' 2"'$  (157.5 cm) Weight: 57.2 kg BMI (Calculated): 23.04   Intake/Output this shift:   Intake/Output Summary (Last 24 hours) at 07/21/2021 1524 Last data filed at 07/21/2021 1153 Gross per 24 hour  Intake 1337.63 ml  Output 1020 ml  Net 317.63 ml    Constitutional :  alert, cooperative, appears stated age, and no distress  Respiratory:  clear to auscultation bilaterally  Cardiovascular:  regular rate and rhythm  Gastrointestinal: Soft, no guarding, suprapubic pain, with drain in place, still has purulent output .   Skin: Cool and moist.   Psychiatric: Normal affect, non-agitated, not confused       LABS:     Latest Ref Rng & Units 07/19/2021    4:45 AM 07/18/2021    7:44 PM 06/16/2021   11:58 AM  CMP  Glucose 70 - 99 mg/dL 99  127  121   BUN 8 - 23 mg/dL '10  11  9   '$ Creatinine 0.44 - 1.00 mg/dL 0.72  0.87  0.71   Sodium 135 - 145 mmol/L 134  132  137   Potassium 3.5 - 5.1 mmol/L 4.1  4.2  3.8   Chloride 98 - 111 mmol/L 100  92  98   CO2 22 - 32 mmol/L '27  28  29   '$ Calcium 8.9 -  10.3 mg/dL 8.5  9.2  9.1   Total Protein 6.5 - 8.1 g/dL  7.6  7.6   Total Bilirubin 0.3 - 1.2 mg/dL  0.9  0.5   Alkaline Phos 38 - 126 U/L  75  77   AST 15 - 41 U/L  19  23   ALT 0 - 44 U/L  12  13       Latest Ref Rng & Units 07/19/2021    4:45 AM 07/18/2021    7:44 PM 06/16/2021   11:58 AM  CBC  WBC 4.0 - 10.5 K/uL 13.2  17.1  11.8   Hemoglobin 12.0 - 15.0 g/dL 12.6  14.6  16.0   Hematocrit 36.0 - 46.0 % 39.2  45.2  49.6   Platelets 150 - 400 K/uL 300  388  338     RADS: N/a Assessment:   Stabl. cont IV abx.  ID consult pending. clears ok for tonight. Maybe regular in am if continues to do well. Pt still wants to defer surgery   labs/images/medications/previous chart entries reviewed personally and relevant changes/updates noted above.

## 2021-07-21 NOTE — Progress Notes (Signed)
PROGRESS NOTE    Brianna Briggs  ION:[id number] DOB: 05-28-1950 DOA: 07/19/2021 PCP: Kirk Ruths, MD    Brief Narrative:  71 year old with history of coronary artery disease, dyslipidemia, solitary kidney and recurrent diverticulitis and diverticular abscess who presently has a drain placed on her left lower quadrant present to the emergency room with left lower quadrant abdominal pain, rectal pain and associated dry heaves for 3 days.  Initially she had diarrhea, she took Imodium and started having more rectal pain with constipation.  In the emergency room hemodynamically stable.  Leukocytes 17.1.  Abnormal CT scan with persistent diverticulitis of the sigmoid colon, previously drained fluid collection with catheter in place, new multiloculated air-fluid collection along the anterior aspect of the sigmoid colon with some inflammatory changes.  Seen by surgery and admitted to hospital.  Assessment & Plan:   Perforated diverticulitis with contained abscess, acute recurrent diverticular abscess with abdominal pain: Followed by surgery.  Recommended conservative management.  Patient also opted for conservative management. Remains on clears and tolerating.  Continue adequate pain medications.  Rocephin and Flagyl, IV fluids.  Mobilize. Flush drain as instructed by IR. Surgery following, anticipating conservative management along with interval colectomy but will need very close monitoring in the hospital for any complications. ID consulted for long-term pain medication management.  COPD: Stable.  On nocturnal oxygen.  On maintenance prednisone ,continued.  Hyperlipidemia: Stable on a statin therapy.   DVT prophylaxis: enoxaparin (LOVENOX) injection 40 mg Start: 07/19/21 0800   Code Status: Full code Family Communication: None Disposition Plan: Status is: Inpatient Remains inpatient appropriate because: IV antibiotics and IV fluids.  Pain management.     Consultants:   General surgery Infectious disease  Procedures:  None  Antimicrobials:  Rocephin and Flagyl 7/4---   Subjective:  Patient seen and examined.  Tolerating clears.  Abdominal pain mild to moderate on the left lower quadrant. Afebrile overnight.  She had episode of severe pain and used a dose of morphine last night. No bowel movement but passing flatus.  Objective: Vitals:   07/20/21 2129 07/21/21 0455 07/21/21 0458 07/21/21 0742  BP: (!) 97/57 (!) 78/51 (!) 86/58 95/61  Pulse: 80 73 74 72  Resp: '17 20  18  '$ Temp: 98 F (36.7 C) 98 F (36.7 C)  97.9 F (36.6 C)  TempSrc:  Oral    SpO2: 97% 96%  97%  Weight:      Height:        Intake/Output Summary (Last 24 hours) at 07/21/2021 1042 Last data filed at 07/21/2021 1040 Gross per 24 hour  Intake 2072.86 ml  Output 1020 ml  Net 1052.86 ml   Filed Weights   07/18/21 1941  Weight: 57.2 kg    Examination:  General exam: Appears anxious.  Frail and debilitated. Respiratory system: No added sounds.  On 2 L oxygen. Cardiovascular system: S1 & S2 heard, RRR.  Gastrointestinal system: Soft.  Mildly tender left lower quadrant. Percutaneous drain with purulent drainage, free-flowing.  Foul-smelling. Central nervous system: Alert and oriented. No focal neurological deficits. Extremities: Symmetric 5 x 5 power. Skin: No rashes, lesions or ulcers Psychiatry: Judgement and insight appear normal.  Anxious.    Data Reviewed: I have personally reviewed following labs and imaging studies  CBC: Recent Labs  Lab 07/18/21 1944 07/19/21 0445  WBC 17.1* 13.2*  HGB 14.6 12.6  HCT 45.2 39.2  MCV 87.4 88.5  PLT 388 244   Basic Metabolic Panel: Recent Labs  Lab 07/18/21 1944  07/19/21 0445  NA 132* 134*  K 4.2 4.1  CL 92* 100  CO2 28 27  GLUCOSE 127* 99  BUN 11 10  CREATININE 0.87 0.72  CALCIUM 9.2 8.5*   GFR: Estimated Creatinine Clearance: 51 mL/min (by C-G formula based on SCr of 0.72 mg/dL). Liver Function  Tests: Recent Labs  Lab 07/18/21 1944  AST 19  ALT 12  ALKPHOS 75  BILITOT 0.9  PROT 7.6  ALBUMIN 2.9*   Recent Labs  Lab 07/18/21 1944  LIPASE 20   No results for input(s): "AMMONIA" in the last 168 hours. Coagulation Profile: Recent Labs  Lab 07/19/21 0445  INR 1.3*   Cardiac Enzymes: No results for input(s): "CKTOTAL", "CKMB", "CKMBINDEX", "TROPONINI" in the last 168 hours. BNP (last 3 results) No results for input(s): "PROBNP" in the last 8760 hours. HbA1C: No results for input(s): "HGBA1C" in the last 72 hours. CBG: No results for input(s): "GLUCAP" in the last 168 hours. Lipid Profile: No results for input(s): "CHOL", "HDL", "LDLCALC", "TRIG", "CHOLHDL", "LDLDIRECT" in the last 72 hours. Thyroid Function Tests: No results for input(s): "TSH", "T4TOTAL", "FREET4", "T3FREE", "THYROIDAB" in the last 72 hours. Anemia Panel: No results for input(s): "VITAMINB12", "FOLATE", "FERRITIN", "TIBC", "IRON", "RETICCTPCT" in the last 72 hours. Sepsis Labs: Recent Labs  Lab 07/19/21 0152 07/19/21 0445  PROCALCITON  --  <0.10  LATICACIDVEN 0.9 1.3    Recent Results (from the past 240 hour(s))  Blood culture (routine x 2)     Status: None (Preliminary result)   Collection Time: 07/19/21  4:46 AM   Specimen: BLOOD  Result Value Ref Range Status   Specimen Description BLOOD LEFT FA  Final   Special Requests   Final    BOTTLES DRAWN AEROBIC AND ANAEROBIC Blood Culture adequate volume   Culture   Final    NO GROWTH 2 DAYS Performed at [facility], [address] [place], [place] [address]    Report Status PENDING  Incomplete  Blood culture (routine x 2)     Status: None (Preliminary result)   Collection Time: 07/19/21  7:18 AM   Specimen: BLOOD  Result Value Ref Range Status   Specimen Description BLOOD BLOOD LEFT HAND  Final   Special Requests   Final    BOTTLES DRAWN AEROBIC AND ANAEROBIC Blood Culture adequate volume   Culture   Final    NO GROWTH 2  DAYS Performed at [facility], [address] [place], [place] [address]    Report Status PENDING  Incomplete         Radiology Studies: No results found.      Scheduled Meds:  cholecalciferol  1,000 Units Oral Daily   dicyclomine  10 mg Oral QID   enoxaparin (LOVENOX) injection  40 mg Subcutaneous Q24H   pantoprazole  20 mg Oral Daily   rosuvastatin  10 mg Oral Once per day on Mon Wed Fri   sodium chloride flush  10 mL Intracatheter QHS   Continuous Infusions:  sodium chloride Stopped (07/20/21 2228)   cefTRIAXone (ROCEPHIN)  IV Stopped (07/20/21 1455)   metronidazole 500 mg (07/21/21 0122)     LOS: 2 days    Time spent: 35 minutes    Barb Merino, MD [facility] Pager [phone]

## 2021-07-22 DIAGNOSIS — K5792 Diverticulitis of intestine, part unspecified, without perforation or abscess without bleeding: Secondary | ICD-10-CM | POA: Diagnosis not present

## 2021-07-22 DIAGNOSIS — K5720 Diverticulitis of large intestine with perforation and abscess without bleeding: Secondary | ICD-10-CM | POA: Diagnosis not present

## 2021-07-22 DIAGNOSIS — K632 Fistula of intestine: Secondary | ICD-10-CM | POA: Diagnosis not present

## 2021-07-22 LAB — CBC WITH DIFFERENTIAL/PLATELET
Abs Immature Granulocytes: 0.04 10*3/uL (ref 0.00–0.07)
Basophils Absolute: 0 10*3/uL (ref 0.0–0.1)
Basophils Relative: 0 %
Eosinophils Absolute: 0.1 10*3/uL (ref 0.0–0.5)
Eosinophils Relative: 1 %
HCT: 35.4 % — ABNORMAL LOW (ref 36.0–46.0)
Hemoglobin: 11.2 g/dL — ABNORMAL LOW (ref 12.0–15.0)
Immature Granulocytes: 0 %
Lymphocytes Relative: 14 %
Lymphs Abs: 1.3 10*3/uL (ref 0.7–4.0)
MCH: 28.3 pg (ref 26.0–34.0)
MCHC: 31.6 g/dL (ref 30.0–36.0)
MCV: 89.4 fL (ref 80.0–100.0)
Monocytes Absolute: 0.7 10*3/uL (ref 0.1–1.0)
Monocytes Relative: 7 %
Neutro Abs: 7.3 10*3/uL (ref 1.7–7.7)
Neutrophils Relative %: 78 %
Platelets: 246 10*3/uL (ref 150–400)
RBC: 3.96 MIL/uL (ref 3.87–5.11)
RDW: 13.1 % (ref 11.5–15.5)
WBC: 9.5 10*3/uL (ref 4.0–10.5)
nRBC: 0 % (ref 0.0–0.2)

## 2021-07-22 LAB — MAGNESIUM: Magnesium: 1.7 mg/dL (ref 1.7–2.4)

## 2021-07-22 LAB — COMPREHENSIVE METABOLIC PANEL
ALT: 9 U/L (ref 0–44)
AST: 12 U/L — ABNORMAL LOW (ref 15–41)
Albumin: 2 g/dL — ABNORMAL LOW (ref 3.5–5.0)
Alkaline Phosphatase: 45 U/L (ref 38–126)
Anion gap: 4 — ABNORMAL LOW (ref 5–15)
BUN: <5 mg/dL — ABNORMAL LOW (ref 8–23)
CO2: 29 mmol/L (ref 22–32)
Calcium: 8.2 mg/dL — ABNORMAL LOW (ref 8.9–10.3)
Chloride: 104 mmol/L (ref 98–111)
Creatinine, Ser: 0.45 mg/dL (ref 0.44–1.00)
GFR, Estimated: >60 mL/min (ref >60)
Glucose, Bld: 78 mg/dL (ref 70–99)
Potassium: 3.7 mmol/L (ref 3.5–5.1)
Sodium: 137 mmol/L (ref 135–145)
Total Bilirubin: 0.6 mg/dL (ref 0.3–1.2)
Total Protein: 5.1 g/dL — ABNORMAL LOW (ref 6.5–8.1)

## 2021-07-22 LAB — PHOSPHORUS: Phosphorus: 2.5 mg/dL (ref 2.5–4.6)

## 2021-07-22 NOTE — Care Management Important Message (Signed)
Important Message  Patient Details  Name: Brianna Briggs MRN: [id number] Date of Birth: 1950/03/16   Medicare Important Message Given:  Yes     [name] 07/22/2021, 10:31 AM

## 2021-07-22 NOTE — Plan of Care (Signed)

## 2021-07-22 NOTE — Progress Notes (Signed)
ID Pt says pain is better No nausea or vomiting  Patient Vitals for the past 24 hrs:  BP Temp Temp src Pulse Resp SpO2  07/22/21 0547 (!) 98/59 98.5 F (36.9 C) Oral 70 15 100 %  07/21/21 1914 (!) 89/56 98.6 F (37 C) Oral 69 19 98 %    Chest b/l Air entry [id number] Abd sofy LLQ cath draining purulent fluid CNS non focal   Labs    Latest Ref Rng & Units 07/22/2021    4:34 AM 07/19/2021    4:45 AM 07/18/2021    7:44 PM  CBC  WBC 4.0 - 10.5 K/uL 9.5  13.2  17.1   Hemoglobin 12.0 - 15.0 g/dL 11.2  12.6  14.6   Hematocrit 36.0 - 46.0 % 35.4  39.2  45.2   Platelets 150 - 400 K/uL 246  300  388        Latest Ref Rng & Units 07/22/2021    4:34 AM 07/19/2021    4:45 AM 07/18/2021    7:44 PM  CMP  Glucose 70 - 99 mg/dL 78  99  127   BUN 8 - 23 mg/dL '5  10  11   '$ Creatinine 0.44 - 1.00 mg/dL 0.45  0.72  0.87   Sodium 135 - 145 mmol/L 137  134  132   Potassium 3.5 - 5.1 mmol/L 3.7  4.1  4.2   Chloride 98 - 111 mmol/L 104  100  92   CO2 22 - 32 mmol/L '29  27  28   '$ Calcium 8.9 - 10.3 mg/dL 8.2  8.5  9.2   Total Protein 6.5 - 8.1 g/dL 5.1   7.6   Total Bilirubin 0.3 - 1.2 mg/dL 0.6   0.9   Alkaline Phos 38 - 126 U/L 45   75   AST 15 - 41 U/L 12   19   ALT 0 - 44 U/L 9   12      Micro Blood culture NG  Impression/recommendation Pt with recurrent diverticulitis/diverticular abscess Had IV antibiotics for 3 weeks in may/June 2023 Has had a cath since May , with many exchages 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 She does not tolerate cipro+  flagyl combo  COPD  Smoker Discussed the management with patient and Dr.Sakai ID will follow her peripherally this weekend

## 2021-07-22 NOTE — Progress Notes (Signed)
Subjective:  CC: Brianna Briggs is a 71 y.o. female  Hospital stay day 3,   recurrent diverticulitis  HPI: No acute issues overnight. Tolerating soft diet.  Feeling much better  ROS:  General: Denies weight loss, weight gain, fatigue, fevers, chills, and night sweats. Heart: Denies chest pain, palpitations, racing heart, irregular heartbeat, leg pain or swelling, and decreased activity tolerance. Respiratory: Denies breathing difficulty, shortness of breath, wheezing, cough, and sputum. GI: Denies change in appetite, heartburn, nausea, vomiting, constipation, diarrhea, and blood in stool. GU: Denies difficulty urinating, pain with urinating, urgency, frequency, blood in urine.   Objective:   Temp:  [98.5 F (36.9 C)-98.6 F (37 C)] 98.5 F (36.9 C) (07/07 0547) Pulse Rate:  [69-70] 70 (07/07 0547) Resp:  [15-19] 15 (07/07 0547) BP: (89-98)/(56-59) 98/59 (07/07 0547) SpO2:  [98 %-100 %] 100 % (07/07 0547)     Height: '5\' 2"'$  (157.5 cm) Weight: 57.2 kg BMI (Calculated): 23.04   Intake/Output this shift:   Intake/Output Summary (Last 24 hours) at 07/22/2021 1731 Last data filed at 07/22/2021 1453 Gross per 24 hour  Intake 2481.98 ml  Output 910 ml  Net 1571.98 ml    Constitutional :  alert, cooperative, appears stated age, and no distress  Respiratory:  clear to auscultation bilaterally  Cardiovascular:  regular rate and rhythm  Gastrointestinal: Soft, no guarding, suprapubic pain, with drain in place, still has purulent output .  Pain much improved  Skin: Cool and moist.   Psychiatric: Normal affect, non-agitated, not confused       LABS:     Latest Ref Rng & Units 07/22/2021    4:34 AM 07/19/2021    4:45 AM 07/18/2021    7:44 PM  CMP  Glucose 70 - 99 mg/dL 78  99  127   BUN 8 - 23 mg/dL '5  10  11   '$ Creatinine 0.44 - 1.00 mg/dL 0.45  0.72  0.87   Sodium 135 - 145 mmol/L 137  134  132   Potassium 3.5 - 5.1 mmol/L 3.7  4.1  4.2   Chloride 98 - 111 mmol/L 104  100  92    CO2 22 - 32 mmol/L '29  27  28   '$ Calcium 8.9 - 10.3 mg/dL 8.2  8.5  9.2   Total Protein 6.5 - 8.1 g/dL 5.1   7.6   Total Bilirubin 0.3 - 1.2 mg/dL 0.6   0.9   Alkaline Phos 38 - 126 U/L 45   75   AST 15 - 41 U/L 12   19   ALT 0 - 44 U/L 9   12       Latest Ref Rng & Units 07/22/2021    4:34 AM 07/19/2021    4:45 AM 07/18/2021    7:44 PM  CBC  WBC 4.0 - 10.5 K/uL 9.5  13.2  17.1   Hemoglobin 12.0 - 15.0 g/dL 11.2  12.6  14.6   Hematocrit 36.0 - 46.0 % 35.4  39.2  45.2   Platelets 150 - 400 K/uL 246  300  388     RADS: N/a Assessment:   Improving pain and tolerating diet now.  Patient still wishes to defer any surgical intervention during this admission.  Discussed case with ID and reviewed consult notes.  Appreciate recommendations.  We will continue IV antibiotics for 1 more day and then as long as no worsening symptoms, will proceed with a trial of p.o. antibiotics cefdinir and Flagyl as recommended.  If patient tolerates oral antibiotics she can be discharged and continue to be monitored on an outpatient basis.  labs/images/medications/previous chart entries reviewed personally and relevant changes/updates noted above.

## 2021-07-22 NOTE — Progress Notes (Signed)
PROGRESS NOTE    Brianna Briggs  KNL:[id number] DOB: 1950-03-04 DOA: 07/19/2021 PCP: Kirk Ruths, MD    Brief Narrative:  71 year old with history of coronary artery disease, dyslipidemia, solitary kidney and recurrent diverticulitis and diverticular abscess who presently has a drain placed on her left lower quadrant present to the emergency room with left lower quadrant abdominal pain, rectal pain and associated dry heaves for 3 days.  Initially she had diarrhea, she took Imodium and started having more rectal pain with constipation.  In the emergency room hemodynamically stable.  Leukocytes 17.1.  Abnormal CT scan with persistent diverticulitis of the sigmoid colon, previously drained fluid collection with catheter in place, new multiloculated air-fluid collection along the anterior aspect of the sigmoid colon with some inflammatory changes.  Seen by surgery and admitted to hospital. Remains in the hospital.  Still symptomatic.  Assessment & Plan:   Perforated diverticulitis with contained abscess, acute recurrent diverticular abscess with abdominal pain: Followed by surgery.  Recommended conservative management.  Patient also opted for conservative management. Remains on clears and tolerating.  Will allow soft diet to see if she tolerates.  Surgery and ID following.  Trying to avoid surgery as possible.   COPD: Stable.  On nocturnal oxygen.  On maintenance prednisone ,continued.  Hyperlipidemia: Stable on a statin therapy.   DVT prophylaxis: enoxaparin (LOVENOX) injection 40 mg Start: 07/19/21 0800   Code Status: Full code Family Communication: None Disposition Plan: Status is: Inpatient Remains inpatient appropriate because: IV antibiotics and IV fluids.  Pain management.     Consultants:  General surgery Infectious disease  Procedures:  None  Antimicrobials:  Rocephin and Flagyl 7/4---   Subjective:  Patient seen and examined.  Anxious.  Mild left lower  quadrant abdominal pain.  No bowel movement.  Does not like to eat.  No appetite. She wants to explore possibility of doing surgery if needed by colorectal surgeon at [facility].  Again she wants to avoid colostomy as much as possible.  She will discuss with the surgeon again today.  Objective: Vitals:   07/21/21 0742 07/21/21 1602 07/21/21 1914 07/22/21 0547  BP: 95/61 (!) 95/57 (!) 89/56 (!) 98/59  Pulse: 72 70 69 70  Resp: '18 18 19 15  '$ Temp: 97.9 F (36.6 C) 97.6 F (36.4 C) 98.6 F (37 C) 98.5 F (36.9 C)  TempSrc:   Oral Oral  SpO2: 97% 96% 98% 100%  Weight:      Height:        Intake/Output Summary (Last 24 hours) at 07/22/2021 1206 Last data filed at 07/22/2021 1144 Gross per 24 hour  Intake 1400.51 ml  Output 910 ml  Net 490.51 ml    Filed Weights   07/18/21 1941  Weight: 57.2 kg    Examination:  General exam: Appears anxious.  Frail and debilitated. Respiratory system: No added sounds.  On 2 L oxygen.  Fairly comfortable looking. Cardiovascular system: S1 & S2 heard, RRR.  Gastrointestinal system: Soft.  Mildly tender left lower quadrant. Percutaneous drain with purulent drainage, free-flowing.  Central nervous system: Alert and oriented. No focal neurological deficits. Extremities: Symmetric 5 x 5 power. Skin: No rashes, lesions or ulcers Psychiatry: Judgement and insight appear normal.  Anxious.    Data Reviewed: I have personally reviewed following labs and imaging studies  CBC: Recent Labs  Lab 07/18/21 1944 07/19/21 0445 07/22/21 0434  WBC 17.1* 13.2* 9.5  NEUTROABS  --   --  7.3  HGB 14.6 12.6 11.2*  HCT 45.2 39.2 35.4*  MCV 87.4 88.5 89.4  PLT 388 300 349    Basic Metabolic Panel: Recent Labs  Lab 07/18/21 1944 07/19/21 0445 07/22/21 0434  NA 132* 134* 137  K 4.2 4.1 3.7  CL 92* 100 104  CO2 '28 27 29  '$ GLUCOSE 127* 99 78  BUN 11 10 <5*  CREATININE 0.87 0.72 0.45  CALCIUM 9.2 8.5* 8.2*  MG  --   --  1.7  PHOS  --   --  2.5     GFR: Estimated Creatinine Clearance: 51 mL/min (by C-G formula based on SCr of 0.45 mg/dL). Liver Function Tests: Recent Labs  Lab 07/18/21 1944 07/22/21 0434  AST 19 12*  ALT 12 9  ALKPHOS 75 45  BILITOT 0.9 0.6  PROT 7.6 5.1*  ALBUMIN 2.9* 2.0*    Recent Labs  Lab 07/18/21 1944  LIPASE 20    No results for input(s): "AMMONIA" in the last 168 hours. Coagulation Profile: Recent Labs  Lab 07/19/21 0445  INR 1.3*    Cardiac Enzymes: No results for input(s): "CKTOTAL", "CKMB", "CKMBINDEX", "TROPONINI" in the last 168 hours. BNP (last 3 results) No results for input(s): "PROBNP" in the last 8760 hours. HbA1C: No results for input(s): "HGBA1C" in the last 72 hours. CBG: No results for input(s): "GLUCAP" in the last 168 hours. Lipid Profile: No results for input(s): "CHOL", "HDL", "LDLCALC", "TRIG", "CHOLHDL", "LDLDIRECT" in the last 72 hours. Thyroid Function Tests: No results for input(s): "TSH", "T4TOTAL", "FREET4", "T3FREE", "THYROIDAB" in the last 72 hours. Anemia Panel: No results for input(s): "VITAMINB12", "FOLATE", "FERRITIN", "TIBC", "IRON", "RETICCTPCT" in the last 72 hours. Sepsis Labs: Recent Labs  Lab 07/19/21 0152 07/19/21 0445  PROCALCITON  --  <0.10  LATICACIDVEN 0.9 1.3     Recent Results (from the past 240 hour(s))  Blood culture (routine x 2)     Status: None (Preliminary result)   Collection Time: 07/19/21  4:46 AM   Specimen: BLOOD  Result Value Ref Range Status   Specimen Description BLOOD LEFT FA  Final   Special Requests   Final    BOTTLES DRAWN AEROBIC AND ANAEROBIC Blood Culture adequate volume   Culture   Final    NO GROWTH 3 DAYS Performed at [facility], [address], [place], [place] [address]    Report Status PENDING  Incomplete  Blood culture (routine x 2)     Status: None (Preliminary result)   Collection Time: 07/19/21  7:18 AM   Specimen: BLOOD  Result Value Ref Range Status   Specimen Description  BLOOD BLOOD LEFT HAND  Final   Special Requests   Final    BOTTLES DRAWN AEROBIC AND ANAEROBIC Blood Culture adequate volume   Culture   Final    NO GROWTH 3 DAYS Performed at [facility], [address] [place], [place] [address]    Report Status PENDING  Incomplete         Radiology Studies: No results found.      Scheduled Meds:  cholecalciferol  1,000 Units Oral Daily   dicyclomine  10 mg Oral QID   enoxaparin (LOVENOX) injection  40 mg Subcutaneous Q24H   pantoprazole  20 mg Oral Daily   rosuvastatin  10 mg Oral Once per day on Mon Wed Fri   sodium chloride flush  10 mL Intracatheter QHS   Continuous Infusions:  sodium chloride 100 mL/hr at 07/22/21 0551   cefTRIAXone (ROCEPHIN)  IV 2 g (07/21/21  1534)   metronidazole Stopped (07/22/21 0416)     LOS: 3 days    Time spent: 35 minutes    Barb Merino, MD [facility] Pager [phone]

## 2021-07-22 NOTE — Consult Note (Addendum)
NAME: Brianna Briggs  DOB: May 04, 1950  MRN: [id number]  Date/Time: 07/22/2021 7:45 AM ( late entry) seen on 07/21/21  REQUESTING PROVIDER: Lysle Pearl Subjective:  REASON FOR CONSULT: diverticular abscess ? Brianna Briggs is a 71 y.o. female with a history of Right breast cancer status postmastectomy recurrent diverticulitis presents to the hospital with pain in the rectum for the past 3 days and also constipation.  Patie patient is being treated for diverticular abscess.  In May 2023 she was hospitalized at [facility] between 06/06/2021 with abdominal pain on the left side.  CT abdomen done showed recurrent acute sigmoid diverticulitis with adjacent 4.8 into 2.4 cm abscess.  IR placed a drain on the culture was Proteus.  She was started on IV meropenem.  She was sent home on IV ertapenem which she took for 2 weeks.  She was supposed to complete a ertapenem on 06/20/2021 but because of twinges of pain and CAT scan was repeated on 06/23/2021 which showed small residual fluid and foci of free air.  There was some inflammatory changes of the proximal sigmoid colon.  She completed ertapenem on 06/22/2021.   On 06/29/2021 IR exchanged abdominopelvic drainage catheter using fluoroscopy guidance.  Sigmoid fistula was noted and it was patent on that examination of 06/29/2021.  On 07/14/2021 IR did a drain check and noted decompressed cavity with patent fistula to the adjacent sigmoid colon.  In the ED on 07/19/2021 BP was 85/45, temperature 98, pulse 82 and sats 100%.  WBC was 13.2, Hb 12.6, platelet 300 and creatinine 0.72.  Repeat CAT scan done on 07/19/2021 showed persistent diverticulitis of the sigmoid colon.  A new multiloculated air-fluid collection Consistent with progressive abscess formation identified along the anterior aspect of the sigmoid colon.  Blood cultures were sent and patient was started on ceftriaxone and IV Flagyl.  And I am seeing the patient for the same. When discussing with the patient she is very  fearful of surgery.  She is also tearful She is planning on seeing a Psychologist, sport and exercise at [facility] who had operated on her stepdaughter with  a similar problem. She smokes half packet of cigarettes a day  Past Medical History:  Diagnosis Date   Allergic genetic state    Breast cancer, right ([place]) 1996   Mastectomy   History of chicken pox    History of colon polyps    Hyperlipidemia    Single kidney    Squamous cell carcinoma of skin 01/27/2019   Right inferior lateral thigh. KA-type    Past Surgical History:  Procedure Laterality Date   ABDOMINAL HYSTERECTOMY     COLONOSCOPY WITH PROPOFOL N/A 05/01/2019   Procedure: COLONOSCOPY WITH PROPOFOL;  Surgeon: Toledo, Benay Pike, MD;  Location: [facility] ENDOSCOPY;  Service: Gastroenterology;  Laterality: N/A;   ESOPHAGOGASTRODUODENOSCOPY (EGD) WITH PROPOFOL N/A 05/01/2019   Procedure: ESOPHAGOGASTRODUODENOSCOPY (EGD) WITH PROPOFOL;  Surgeon: Toledo, Benay Pike, MD;  Location: [facility] ENDOSCOPY;  Service: Gastroenterology;  Laterality: N/A;   IR CATHETER TUBE CHANGE  06/29/2021   IR RADIOLOGIST EVAL & MGMT  06/23/2021   IR RADIOLOGIST EVAL & MGMT  07/14/2021   MASTECTOMY     SHOULDER ARTHROSCOPY WITH SUBACROMIAL DECOMPRESSION, ROTATOR CUFF REPAIR AND BICEP TENDON REPAIR Left 01/13/2008   TONSILLECTOMY     TUBAL LIGATION      Social History   Socioeconomic History   Marital status: Married    Spouse name: Not on file   Number of children: Not on file   Years of education: Not  on file   Highest education level: Not on file  Occupational History   Not on file  Tobacco Use   Smoking status: Every Day    Packs/day: 1.50    Years: 40.00    Total pack years: 60.00    Types: Cigarettes   Smokeless tobacco: Never  Vaping Use   Vaping Use: Never used  Substance and Sexual Activity   Alcohol use: No   Drug use: No   Sexual activity: Yes    Partners: Male  Other Topics Concern   Not on file  Social History Narrative   Lives at home with husband    Social Determinants of Health   Financial Resource Strain: Not on file  Food Insecurity: Not on file  Transportation Needs: Not on file  Physical Activity: Not on file  Stress: Not on file  Social Connections: Not on file  Intimate Partner Violence: Not on file    Family History  Problem Relation Age of Onset   Cancer Father    Kidney failure Mother    Allergies  Allergen Reactions   Amoxicillin-Pot Clavulanate Diarrhea and Rash    Other reaction(s): Unknown   Biaxin [Clarithromycin] Diarrhea    Other reaction(s): Unknown   Oxycodone Itching   Codeine Rash   Nitrofurantoin Rash   I? Current Facility-Administered Medications  Medication Dose Route Frequency Provider Last Rate Last Admin   0.9 %  sodium chloride infusion   Intravenous Continuous Mansy, Jan A, MD 100 mL/hr at 07/22/21 0551 New Bag at 07/22/21 0551   acetaminophen (TYLENOL) tablet 650 mg  650 mg Oral Q6H PRN Mansy, Jan A, MD   650 mg at 07/21/21 0216   albuterol (PROVENTIL) (2.5 MG/3ML) 0.083% nebulizer solution 2.5 mg  2.5 mg Inhalation Q6H PRN Barb Merino, MD       cefTRIAXone (ROCEPHIN) 2 g in sodium chloride 0.9 % 100 mL IVPB  2 g Intravenous Q24H Lorna Dibble, RPH 200 mL/hr at 07/21/21 1534 2 g at 07/21/21 1534   cholecalciferol (VITAMIN D3) tablet 1,000 Units  1,000 Units Oral Daily Mansy, Jan A, MD   1,000 Units at 07/21/21 0810   dicyclomine (BENTYL) capsule 10 mg  10 mg Oral QID Mansy, Jan A, MD   10 mg at 07/21/21 2026   enoxaparin (LOVENOX) injection 40 mg  40 mg Subcutaneous Q24H Mansy, Jan A, MD   40 mg at 07/21/21 0811   magnesium hydroxide (MILK OF MAGNESIA) suspension 30 mL  30 mL Oral Daily PRN Mansy, Jan A, MD       metroNIDAZOLE (FLAGYL) IVPB 500 mg  500 mg Intravenous Q12H Darrin Luis D, RPH 100 mL/hr at 07/22/21 0316 500 mg at 07/22/21 0316   morphine (PF) 2 MG/ML injection 2 mg  2 mg Intravenous Q4H PRN Mansy, Jan A, MD   2 mg at 07/21/21 0120   ondansetron (ZOFRAN) tablet 4 mg  4  mg Oral Q6H PRN Mansy, Jan A, MD   4 mg at 07/21/21 7673   Or   ondansetron (ZOFRAN) injection 4 mg  4 mg Intravenous Q6H PRN Mansy, Jan A, MD   4 mg at 07/20/21 2227   pantoprazole (PROTONIX) EC tablet 20 mg  20 mg Oral Daily Sakai, Isami, DO   20 mg at 07/21/21 4193   rosuvastatin (CRESTOR) tablet 10 mg  10 mg Oral Once per day on Mon Wed Fri Mansy, Jan A, MD       sodium chloride flush (NS) 0.9 %  injection 10 mL  10 mL Intracatheter QHS Mansy, Jan A, MD   10 mL at 07/21/21 2026   traMADol (ULTRAM) tablet 50 mg  50 mg Oral Q6H PRN Mansy, Jan A, MD   50 mg at 07/21/21 2026   traZODone (DESYREL) tablet 25 mg  25 mg Oral QHS PRN Mansy, Jan A, MD         Abtx:  Anti-infectives (From admission, onward)    Start     Dose/Rate Route Frequency Ordered Stop   07/20/21 1400  cefTRIAXone (ROCEPHIN) 2 g in sodium chloride 0.9 % 100 mL IVPB        2 g 200 mL/hr over 30 Minutes Intravenous Every 24 hours 07/19/21 1230 07/26/21 1359   07/19/21 1500  metroNIDAZOLE (FLAGYL) IVPB 500 mg        500 mg 100 mL/hr over 60 Minutes Intravenous Every 12 hours 07/19/21 1230 07/26/21 0259   07/19/21 1300  cefTRIAXone (ROCEPHIN) 2 g in sodium chloride 0.9 % 100 mL IVPB        2 g 200 mL/hr over 30 Minutes Intravenous  Once 07/19/21 1237 07/19/21 1313   07/19/21 0800  cefTRIAXone (ROCEPHIN) 2 g in sodium chloride 0.9 % 100 mL IVPB  Status:  Discontinued        2 g 200 mL/hr over 30 Minutes Intravenous Every 24 hours 07/19/21 0401 07/19/21 0402   07/19/21 0415  metroNIDAZOLE (FLAGYL) IVPB 500 mg  Status:  Discontinued        500 mg 100 mL/hr over 60 Minutes Intravenous Every 12 hours 07/19/21 0401 07/19/21 0402   07/19/21 0200  ceFEPIme (MAXIPIME) 2 g in sodium chloride 0.9 % 100 mL IVPB       See Hyperspace for full Linked Orders Report.   2 g 200 mL/hr over 30 Minutes Intravenous  Once 07/19/21 0151 07/19/21 0254   07/19/21 0200  metroNIDAZOLE (FLAGYL) IVPB 500 mg       See Hyperspace for full Linked Orders  Report.   500 mg 100 mL/hr over 60 Minutes Intravenous  Once 07/19/21 0151 07/19/21 0405       REVIEW OF SYSTEMS:  Const: negative fever, negative chills, negative weight loss Eyes: negative diplopia or visual changes, negative eye pain ENT: negative coryza, negative sore throat Resp: Baseline cough, hemoptysis, dyspnea Cards: negative for chest pain, palpitations, lower extremity edema GU: negative for frequency, dysuria and hematuria GI: Rectal pain. Rectal fullness.  Constipation Skin: negative for rash and pruritus Heme: negative for easy bruising and gum/nose bleeding MS: Weakness Neurolo:negative for headaches, dizziness, vertigo, memory problems  Psych: negative for feelings of anxiety, depression  Endocrine: negative for thyroid, diabetes Allergy/Immunology-as above Objective:  VITALS:  BP (!) 98/59 (BP Location: Left Arm)   Pulse 70   Temp 98.5 F (36.9 C) (Oral)   Resp 15   Ht '5\' 2"'$  (1.575 m)   Wt 57.2 kg   SpO2 100%   BMI 23.05 kg/m   PHYSICAL EXAM:  General: Alert, cooperative, no distress, appears stated age.  Pale Head: Normocephalic, without obvious abnormality, atraumatic. Eyes: Conjunctivae clear, anicteric sclerae. Pupils are equal ENT Nares normal. No drainage or sinus tenderness. Lips, mucosa, and tongue normal. No Thrush Neck: Supple, symmetrical, no adenopathy, thyroid: non tender no carotid bruit and no JVD. Back: No CVA tenderness. Lungs: Bilateral air entry Heart: Regular rate and rhythm, no murmur, rub or gallop. Abdomen: Soft, left lower quadrant catheter in place Extremities: atraumatic, no cyanosis. No edema. No  clubbing Skin: No rashes or lesions. Or bruising Lymph: Cervical, supraclavicular normal. Neurologic: Grossly non-focal Pertinent Labs Lab Results CBC    Component Value Date/Time   WBC 9.5 07/22/2021 0434   RBC 3.96 07/22/2021 0434   HGB 11.2 (L) 07/22/2021 0434   HCT 35.4 (L) 07/22/2021 0434   PLT 246 07/22/2021 0434    MCV 89.4 07/22/2021 0434   MCH 28.3 07/22/2021 0434   MCHC 31.6 07/22/2021 0434   RDW 13.1 07/22/2021 0434   LYMPHSABS 1.3 07/22/2021 0434   MONOABS 0.7 07/22/2021 0434   EOSABS 0.1 07/22/2021 0434   BASOSABS 0.0 07/22/2021 0434       Latest Ref Rng & Units 07/22/2021    4:34 AM 07/19/2021    4:45 AM 07/18/2021    7:44 PM  CMP  Glucose 70 - 99 mg/dL 78  99  127   BUN 8 - 23 mg/dL '5  10  11   '$ Creatinine 0.44 - 1.00 mg/dL 0.45  0.72  0.87   Sodium 135 - 145 mmol/L 137  134  132   Potassium 3.5 - 5.1 mmol/L 3.7  4.1  4.2   Chloride 98 - 111 mmol/L 104  100  92   CO2 22 - 32 mmol/L '29  27  28   '$ Calcium 8.9 - 10.3 mg/dL 8.2  8.5  9.2   Total Protein 6.5 - 8.1 g/dL 5.1   7.6   Total Bilirubin 0.3 - 1.2 mg/dL 0.6   0.9   Alkaline Phos 38 - 126 U/L 45   75   AST 15 - 41 U/L 12   19   ALT 0 - 44 U/L 9   12       Microbiology: Recent Results (from the past 240 hour(s))  Blood culture (routine x 2)     Status: None (Preliminary result)   Collection Time: 07/19/21  4:46 AM   Specimen: BLOOD  Result Value Ref Range Status   Specimen Description BLOOD LEFT FA  Final   Special Requests   Final    BOTTLES DRAWN AEROBIC AND ANAEROBIC Blood Culture adequate volume   Culture   Final    NO GROWTH 3 DAYS Performed at [facility], [address] [place], [place] [address]    Report Status PENDING  Incomplete  Blood culture (routine x 2)     Status: None (Preliminary result)   Collection Time: 07/19/21  7:18 AM   Specimen: BLOOD  Result Value Ref Range Status   Specimen Description BLOOD BLOOD LEFT HAND  Final   Special Requests   Final    BOTTLES DRAWN AEROBIC AND ANAEROBIC Blood Culture adequate volume   Culture   Final    NO GROWTH 3 DAYS Performed at [facility], [address] [place], [place] [address]    Report Status PENDING  Incomplete    IMAGING RESULTS: CT abdomen shows drainage catheter identified on side of the sigmoid colon.  No residual  abscess around the catheter.  But there is a new multiloculated air-fluid collection consistent with progressive abscess formation identified along the anterior aspect of the sigmoid colon. I have personally reviewed the films ? Impression/Recommendation Diverticular abscess with sigmoid fistula Patient has had recurrent diverticulitis since 2021. She has had close to 20 days  of IV antibiotics in May /june2023 She is currently on ceftriaxone and Flagyl I doubt antibiotics is going to cure the sigmoid fistula.  She is going to need minimal , a  diverting colostomy if not a complete surgery. Also the catheter has to be repositioned Cultures will have to be sent. Patient is contemplating seeing the surgeon at [facility] but operated on her stepdaughter for a similar condition.  But she is also very fearful of surgery. I explained to her that antibiotics and drainage catheter is not going to be adequate especially in light of a sigmoid fistula  Patient continues to smoke which is a deterrent for healing  COPD.  will discuss with Dr. Lysle Pearl.  ? ? Note:  This document was prepared using Dragon voice recognition software and may include unintentional dictation errors.

## 2021-07-23 DIAGNOSIS — K5720 Diverticulitis of large intestine with perforation and abscess without bleeding: Secondary | ICD-10-CM | POA: Diagnosis not present

## 2021-07-23 MED ORDER — METRONIDAZOLE 500 MG PO TABS
500.0000 mg | ORAL_TABLET | Freq: Two times a day (BID) | ORAL | Status: DC
  Administered 2021-07-23 – 2021-07-25 (×5): 500 mg via ORAL
  Filled 2021-07-23 (×6): qty 1

## 2021-07-23 MED ORDER — CEFDINIR 300 MG PO CAPS
300.0000 mg | ORAL_CAPSULE | Freq: Two times a day (BID) | ORAL | Status: DC
  Administered 2021-07-23 – 2021-07-25 (×5): 300 mg via ORAL
  Filled 2021-07-23 (×5): qty 1

## 2021-07-23 NOTE — Progress Notes (Signed)
PROGRESS NOTE    Brianna Briggs  WIO:[id number] DOB: [birth year] DOA: 07/19/2021 PCP: Kirk Ruths, MD    Brief Narrative:  71 year old with history of coronary artery disease, dyslipidemia, solitary kidney and recurrent diverticulitis and diverticular abscess who presently has a drain placed on her left lower quadrant present to the emergency room with left lower quadrant abdominal pain, rectal pain and associated dry heaves for 3 days.  Initially she had diarrhea, she took Imodium and started having more rectal pain with constipation.  In the emergency room hemodynamically stable.  Leukocytes 17.1.  Abnormal CT scan with persistent diverticulitis of the sigmoid colon, previously drained fluid collection with catheter in place, new multiloculated air-fluid collection along the anterior aspect of the sigmoid colon with some inflammatory changes.  Seen by surgery and admitted to hospital. Remains in the hospital.  Still symptomatic.  Assessment & Plan:   Perforated diverticulitis with contained abscess, acute recurrent diverticular abscess with abdominal pain: Followed by surgery.  Recommended conservative management.  Patient also opted for conservative management. Remains on Rocephin and Flagyl.  Surgery and ID following. Patient is tolerating regular diet today.  Pain is managed with oral pain medications.  She had 1 bowel movement today. As per ID plan, will start challenging with oral Ceftin and Flagyl and monitor for tolerance before discharging with long-term antibiotics.   COPD: Stable.  On nocturnal oxygen.  On maintenance prednisone ,continued.  Hyperlipidemia: Stable on a statin therapy.   DVT prophylaxis: enoxaparin (LOVENOX) injection 40 mg Start: 07/19/21 0800   Code Status: Full code Family Communication: None Disposition Plan: Status is: Inpatient Remains inpatient appropriate because: Challenging with regular diet and oral medications today.      Consultants:  General surgery Infectious disease  Procedures:  None  Antimicrobials:  Rocephin and Flagyl 7/4--- 7/8 Ceftin and Flagyl 7/8---   Subjective:  Patient seen and examined.  Today she was able to get around and walk.  She had her first bowel movement today which was like "brick". Pain is controlled. Some leakage around the percutaneous tube.  Objective: Vitals:   07/22/21 0547 07/22/21 1939 07/23/21 0326 07/23/21 0743  BP: (!) 98/59 (!) 89/52 (!) 89/56 92/65  Pulse: 70 71 (!) 56 62  Resp: '15 18 18 18  '$ Temp: 98.5 F (36.9 C) 97.8 F (36.6 C) 98.1 F (36.7 C) 98.3 F (36.8 C)  TempSrc: Oral   Oral  SpO2: 100% 98% 93% 98%  Weight:      Height:        Intake/Output Summary (Last 24 hours) at 07/23/2021 1307 Last data filed at 07/23/2021 0646 Gross per 24 hour  Intake 1201.47 ml  Output 800 ml  Net 401.47 ml   Filed Weights   07/18/21 1941  Weight: 57.2 kg    Examination:  General exam: Frail and debilitated.  Not in any distress.  Able to walk around on room air. Respiratory system: No added sounds.  On room air today. Cardiovascular system: S1 & S2 heard, RRR.  Gastrointestinal system: Soft.  Mildly tender left lower quadrant. Percutaneous drain with purulent drainage, free-flowing.  No obvious drainage around the tube. Central nervous system: Alert and oriented. No focal neurological deficits. Extremities: Symmetric 5 x 5 power. Skin: No rashes, lesions or ulcers Psychiatry: Anxious.    Data Reviewed: I have personally reviewed following labs and imaging studies  CBC: Recent Labs  Lab 07/18/21 1944 07/19/21 0445 07/22/21 0434  WBC 17.1* 13.2* 9.5  NEUTROABS  --   --  7.3  HGB 14.6 12.6 11.2*  HCT 45.2 39.2 35.4*  MCV 87.4 88.5 89.4  PLT 388 300 315   Basic Metabolic Panel: Recent Labs  Lab 07/18/21 1944 07/19/21 0445 07/22/21 0434  NA 132* 134* 137  K 4.2 4.1 3.7  CL 92* 100 104  CO2 '28 27 29  '$ GLUCOSE 127* 99 78  BUN 11  10 <5*  CREATININE 0.87 0.72 0.45  CALCIUM 9.2 8.5* 8.2*  MG  --   --  1.7  PHOS  --   --  2.5   GFR: Estimated Creatinine Clearance: 51 mL/min (by C-G formula based on SCr of 0.45 mg/dL). Liver Function Tests: Recent Labs  Lab 07/18/21 1944 07/22/21 0434  AST 19 12*  ALT 12 9  ALKPHOS 75 45  BILITOT 0.9 0.6  PROT 7.6 5.1*  ALBUMIN 2.9* 2.0*   Recent Labs  Lab 07/18/21 1944  LIPASE 20   No results for input(s): "AMMONIA" in the last 168 hours. Coagulation Profile: Recent Labs  Lab 07/19/21 0445  INR 1.3*   Cardiac Enzymes: No results for input(s): "CKTOTAL", "CKMB", "CKMBINDEX", "TROPONINI" in the last 168 hours. BNP (last 3 results) No results for input(s): "PROBNP" in the last 8760 hours. HbA1C: No results for input(s): "HGBA1C" in the last 72 hours. CBG: No results for input(s): "GLUCAP" in the last 168 hours. Lipid Profile: No results for input(s): "CHOL", "HDL", "LDLCALC", "TRIG", "CHOLHDL", "LDLDIRECT" in the last 72 hours. Thyroid Function Tests: No results for input(s): "TSH", "T4TOTAL", "FREET4", "T3FREE", "THYROIDAB" in the last 72 hours. Anemia Panel: No results for input(s): "VITAMINB12", "FOLATE", "FERRITIN", "TIBC", "IRON", "RETICCTPCT" in the last 72 hours. Sepsis Labs: Recent Labs  Lab 07/19/21 0152 07/19/21 0445  PROCALCITON  --  <0.10  LATICACIDVEN 0.9 1.3    Recent Results (from the past 240 hour(s))  Blood culture (routine x 2)     Status: None (Preliminary result)   Collection Time: 07/19/21  4:46 AM   Specimen: BLOOD  Result Value Ref Range Status   Specimen Description BLOOD LEFT FA  Final   Special Requests   Final    BOTTLES DRAWN AEROBIC AND ANAEROBIC Blood Culture adequate volume   Culture   Final    NO GROWTH 4 DAYS Performed at [facility], [address] [place], [place] [address]    Report Status PENDING  Incomplete  Blood culture (routine x 2)     Status: None (Preliminary result)   Collection Time:  07/19/21  7:18 AM   Specimen: BLOOD  Result Value Ref Range Status   Specimen Description BLOOD BLOOD LEFT HAND  Final   Special Requests   Final    BOTTLES DRAWN AEROBIC AND ANAEROBIC Blood Culture adequate volume   Culture   Final    NO GROWTH 4 DAYS Performed at [facility], [address] [place], [place] [address]    Report Status PENDING  Incomplete         Radiology Studies: No results found.      Scheduled Meds:  cefdinir  300 mg Oral Q12H   cholecalciferol  1,000 Units Oral Daily   dicyclomine  10 mg Oral QID   enoxaparin (LOVENOX) injection  40 mg Subcutaneous Q24H   metroNIDAZOLE  500 mg Oral Q12H   pantoprazole  20 mg Oral Daily   rosuvastatin  10 mg Oral Once per day on Mon Wed Fri   sodium chloride flush  10 mL Intracatheter QHS   Continuous Infusions:  LOS: 4 days    Time spent: 35 minutes    Barb Merino, MD [facility] Pager [phone]

## 2021-07-23 NOTE — Progress Notes (Signed)
Patient ID: Brianna Briggs, female   DOB: Jun 16, 1950, 71 y.o.   MRN: [id number]     [place] Hospital Day(s): 4.   Interval History: Patient seen and examined, no acute events or new complaints overnight. Patient reports Pain controlled with current pain medications. Pain is intermittent. Endorses she tolerated the soft diet. No fevers. No nausea or vomiting. Endorses tolerated the oral antibiotics  Vital signs in last 24 hours: [min-max] current  Temp:  [97.8 F (36.6 C)-98.3 F (36.8 C)] 97.9 F (36.6 C) (07/08 1637) Pulse Rate:  [56-71] 61 (07/08 1637) Resp:  [18] 18 (07/08 1637) BP: (89-97)/(52-65) 97/62 (07/08 1637) SpO2:  [93 %-98 %] 94 % (07/08 1637)     Height: '5\' 2"'$  (157.5 cm) Weight: 57.2 kg BMI (Calculated): 23.04   Physical Exam:  Constitutional: alert, cooperative and no distress  Respiratory: breathing non-labored at rest  Cardiovascular: regular rate and sinus rhythm  Gastrointestinal: soft, non-tender, and non-distended  Labs:     Latest Ref Rng & Units 07/22/2021    4:34 AM 07/19/2021    4:45 AM 07/18/2021    7:44 PM  CBC  WBC 4.0 - 10.5 K/uL 9.5  13.2  17.1   Hemoglobin 12.0 - 15.0 g/dL 11.2  12.6  14.6   Hematocrit 36.0 - 46.0 % 35.4  39.2  45.2   Platelets 150 - 400 K/uL 246  300  388       Latest Ref Rng & Units 07/22/2021    4:34 AM 07/19/2021    4:45 AM 07/18/2021    7:44 PM  CMP  Glucose 70 - 99 mg/dL 78  99  127   BUN 8 - 23 mg/dL '5  10  11   '$ Creatinine 0.44 - 1.00 mg/dL 0.45  0.72  0.87   Sodium 135 - 145 mmol/L 137  134  132   Potassium 3.5 - 5.1 mmol/L 3.7  4.1  4.2   Chloride 98 - 111 mmol/L 104  100  92   CO2 22 - 32 mmol/L '29  27  28   '$ Calcium 8.9 - 10.3 mg/dL 8.2  8.5  9.2   Total Protein 6.5 - 8.1 g/dL 5.1   7.6   Total Bilirubin 0.3 - 1.2 mg/dL 0.6   0.9   Alkaline Phos 38 - 126 U/L 45   75   AST 15 - 41 U/L 12   19   ALT 0 - 44 U/L 9   12     Imaging studies: No new pertinent imaging studies   Assessment/Plan:  71  y.o. female with recurrent diverticulitis, complicated by pertinent comorbidities including coronary artery disease, peripheral artery disease.  -Patient is responding adequately to antibiotic therapy -No fevers. Normal WBC count -Pain controlled with current pain medications -Agree with ID to try oral antibiotics.  -May be discharged with oral antibiotics. The goal of the antibiotic therapy is not to "cure" the diverticular process but to cool the area down to try to avoid an ostomy. The longer the patient can be without an acute attack before surgery, the higher her opportunities to get a partial colectomy without colostomy.  Upon discharge, the patient will be follow in 3 weeks with Dr. Lysle Pearl for further management and planning.   Arnold Long, MD

## 2021-07-24 DIAGNOSIS — K5720 Diverticulitis of large intestine with perforation and abscess without bleeding: Secondary | ICD-10-CM | POA: Diagnosis not present

## 2021-07-24 LAB — CULTURE, BLOOD (ROUTINE X 2)
Culture: NO GROWTH
Culture: NO GROWTH
Special Requests: ADEQUATE
Special Requests: ADEQUATE

## 2021-07-24 MED ORDER — POLYETHYLENE GLYCOL 3350 17 G PO PACK
17.0000 g | PACK | Freq: Two times a day (BID) | ORAL | Status: DC
  Administered 2021-07-24 – 2021-07-25 (×2): 17 g via ORAL
  Filled 2021-07-24 (×3): qty 1

## 2021-07-24 MED ORDER — RISAQUAD PO CAPS
1.0000 | ORAL_CAPSULE | Freq: Three times a day (TID) | ORAL | Status: DC
  Administered 2021-07-24 – 2021-07-25 (×5): 1 via ORAL
  Filled 2021-07-24 (×5): qty 1

## 2021-07-24 NOTE — Progress Notes (Signed)
Patient ID: Brianna Briggs, female   DOB: 1950-12-01, 71 y.o.   MRN: [id number]     [facility] Day(s): 5.   Interval History: Patient seen and examined, no acute events or new complaints overnight. Patient reports she had some stomach upset from antibiotic therapy.  She continues with left lower quadrant pain.  Left lower quadrant pain intermittent.  Something controlled with pain medications.  New upper abdominal burning.  Aggravated by antibiotic therapy.  Vital signs in last 24 hours: [min-max] current  Temp:  [97.1 F (36.2 C)-99 F (37.2 C)] 97.8 F (36.6 C) (07/09 1013) Pulse Rate:  [61-79] 72 (07/09 1013) Resp:  [17-20] 17 (07/09 1013) BP: (89-107)/(50-66) 89/54 (07/09 1013) SpO2:  [85 %-99 %] 96 % (07/09 1013)     Height: '5\' 2"'$  (157.5 cm) Weight: 57.2 kg BMI (Calculated): 23.04   Physical Exam:  Constitutional: alert, cooperative and no distress  Respiratory: breathing non-labored at rest  Cardiovascular: regular rate and sinus rhythm  Gastrointestinal: soft, mildly-tender in the left lower quadrant, and non-distended.  Labs:     Latest Ref Rng & Units 07/22/2021    4:34 AM 07/19/2021    4:45 AM 07/18/2021    7:44 PM  CBC  WBC 4.0 - 10.5 K/uL 9.5  13.2  17.1   Hemoglobin 12.0 - 15.0 g/dL 11.2  12.6  14.6   Hematocrit 36.0 - 46.0 % 35.4  39.2  45.2   Platelets 150 - 400 K/uL 246  300  388       Latest Ref Rng & Units 07/22/2021    4:34 AM 07/19/2021    4:45 AM 07/18/2021    7:44 PM  CMP  Glucose 70 - 99 mg/dL 78  99  127   BUN 8 - 23 mg/dL '5  10  11   '$ Creatinine 0.44 - 1.00 mg/dL 0.45  0.72  0.87   Sodium 135 - 145 mmol/L 137  134  132   Potassium 3.5 - 5.1 mmol/L 3.7  4.1  4.2   Chloride 98 - 111 mmol/L 104  100  92   CO2 22 - 32 mmol/L '29  27  28   '$ Calcium 8.9 - 10.3 mg/dL 8.2  8.5  9.2   Total Protein 6.5 - 8.1 g/dL 5.1   7.6   Total Bilirubin 0.3 - 1.2 mg/dL 0.6   0.9   Alkaline Phos 38 - 126 U/L 45   75   AST 15 - 41 U/L 12   19   ALT 0  - 44 U/L 9   12     Imaging studies: No new pertinent imaging studies   Assessment/Plan:  71 y.o. female with recurrent diverticulitis, complicated by pertinent comorbidities including coronary artery disease, peripheral artery disease.  Complicated diverticulitis with abscess -S/p percutaneous drainage.  There is persistent.  Output to drain. -Continue with intermittent localized pain in the left lower quadrant -Patient has had multiple discussion with surgery regarding surgical management with Hartman's procedure versus trying trickle of inflammation with antibiotic therapy to try to avoid a colostomy.  She understand that she is still high risk for colostomy -Today her stomach was upset may be from the antibiotic therapy. -I added probiotics and MiraLAX since she is also constipated. -I discussed with patient to give her 24 more hours to see if she will feel comfortable going home with oral antibiotics. -If she does not feel comfortable with oral antibiotics my only other option  is to proceed with surgery -She is currently tolerating soft diet.  We will keep patient on soft diet -I encouraged the patient to ambulate  Arnold Long, MD

## 2021-07-24 NOTE — Progress Notes (Signed)
PROGRESS NOTE    Brianna Briggs  LYY:[id number] DOB: [birth year] DOA: 07/19/2021 PCP: Kirk Ruths, MD    Brief Narrative:  71 year old with history of coronary artery disease, dyslipidemia, solitary kidney and recurrent diverticulitis and diverticular abscess who presently has a drain placed on her left lower quadrant present to the emergency room with left lower quadrant abdominal pain, rectal pain and associated dry heaves for 3 days.  Initially she had diarrhea, she took Imodium and started having more rectal pain with constipation.  In the emergency room hemodynamically stable.  Leukocytes 17.1.  Abnormal CT scan with persistent diverticulitis of the sigmoid colon, previously drained fluid collection with catheter in place, new multiloculated air-fluid collection along the anterior aspect of the sigmoid colon with some inflammatory changes.  Seen by surgery and admitted to hospital. Remains in the hospital.  Still symptomatic. Challenging with oral antibiotics.  Assessment & Plan:   Perforated diverticulitis with contained abscess, acute recurrent diverticular abscess with abdominal pain: Followed by surgery.  Recommended conservative management.  Patient also opted for conservative management. Surgery and ID following. Tolerating regular diet. Challenged with oral antibiotics with Ceftin and Flagyl, she is having some abdominal discomfort and epigastric discomfort. MiraLAX and probiotics added. Continue to monitor as per surgery today.  She may end up having to have surgery and colostomy.  COPD: Stable.  On nocturnal oxygen.  On maintenance prednisone ,continued.  Hyperlipidemia: Stable on a statin therapy.   DVT prophylaxis: enoxaparin (LOVENOX) injection 40 mg Start: 07/19/21 0800   Code Status: Full code Family Communication: None Disposition Plan: Status is: Inpatient Remains inpatient appropriate because: Challenging with regular diet and oral medications  today.     Consultants:  General surgery Infectious disease  Procedures:  None  Antimicrobials:  Rocephin and Flagyl 7/4--- 7/8 Ceftin and Flagyl 7/8---   Subjective:  Seen and examined.  She started having epigastric discomfort and burning since last night.  No adequate bowel movement.  She is choosing to eat some soft diet and tolerating.  Pain is controlled with tramadol.  Objective: Vitals:   07/24/21 0555 07/24/21 0804 07/24/21 0805 07/24/21 1013  BP: 107/61 (!) 90/50  (!) 89/54  Pulse: 71 65 79 72  Resp: '20 18  17  '$ Temp: 98.9 F (37.2 C) (!) 97.1 F (36.2 C)  97.8 F (36.6 C)  TempSrc: Oral   Oral  SpO2: 99% (!) 85% 91% 96%  Weight:      Height:        Intake/Output Summary (Last 24 hours) at 07/24/2021 1219 Last data filed at 07/24/2021 0719 Gross per 24 hour  Intake --  Output 10 ml  Net -10 ml   Filed Weights   07/18/21 1941  Weight: 57.2 kg    Examination:  General exam: Frail and debilitated.  Not in any distress. Respiratory system: No added sounds.  On room air today. Cardiovascular system: S1 & S2 heard, RRR.  Gastrointestinal system: Soft.  Mildly tender left lower quadrant. Percutaneous drain with purulent drainage, free-flowing.  Minimal drainage around the tube. Central nervous system: Alert and oriented. No focal neurological deficits. Extremities: Symmetric 5 x 5 power. Skin: No rashes, lesions or ulcers   Data Reviewed: I have personally reviewed following labs and imaging studies  CBC: Recent Labs  Lab 07/18/21 1944 07/19/21 0445 07/22/21 0434  WBC 17.1* 13.2* 9.5  NEUTROABS  --   --  7.3  HGB 14.6 12.6 11.2*  HCT 45.2 39.2 35.4*  MCV 87.4  88.5 89.4  PLT 388 300 283   Basic Metabolic Panel: Recent Labs  Lab 07/18/21 1944 07/19/21 0445 07/22/21 0434  NA 132* 134* 137  K 4.2 4.1 3.7  CL 92* 100 104  CO2 '28 27 29  '$ GLUCOSE 127* 99 78  BUN 11 10 <5*  CREATININE 0.87 0.72 0.45  CALCIUM 9.2 8.5* 8.2*  MG  --   --  1.7   PHOS  --   --  2.5   GFR: Estimated Creatinine Clearance: 51 mL/min (by C-G formula based on SCr of 0.45 mg/dL). Liver Function Tests: Recent Labs  Lab 07/18/21 1944 07/22/21 0434  AST 19 12*  ALT 12 9  ALKPHOS 75 45  BILITOT 0.9 0.6  PROT 7.6 5.1*  ALBUMIN 2.9* 2.0*   Recent Labs  Lab 07/18/21 1944  LIPASE 20   No results for input(s): "AMMONIA" in the last 168 hours. Coagulation Profile: Recent Labs  Lab 07/19/21 0445  INR 1.3*   Cardiac Enzymes: No results for input(s): "CKTOTAL", "CKMB", "CKMBINDEX", "TROPONINI" in the last 168 hours. BNP (last 3 results) No results for input(s): "PROBNP" in the last 8760 hours. HbA1C: No results for input(s): "HGBA1C" in the last 72 hours. CBG: No results for input(s): "GLUCAP" in the last 168 hours. Lipid Profile: No results for input(s): "CHOL", "HDL", "LDLCALC", "TRIG", "CHOLHDL", "LDLDIRECT" in the last 72 hours. Thyroid Function Tests: No results for input(s): "TSH", "T4TOTAL", "FREET4", "T3FREE", "THYROIDAB" in the last 72 hours. Anemia Panel: No results for input(s): "VITAMINB12", "FOLATE", "FERRITIN", "TIBC", "IRON", "RETICCTPCT" in the last 72 hours. Sepsis Labs: Recent Labs  Lab 07/19/21 0152 07/19/21 0445  PROCALCITON  --  <0.10  LATICACIDVEN 0.9 1.3    Recent Results (from the past 240 hour(s))  Blood culture (routine x 2)     Status: None   Collection Time: 07/19/21  4:46 AM   Specimen: BLOOD  Result Value Ref Range Status   Specimen Description BLOOD LEFT FA  Final   Special Requests   Final    BOTTLES DRAWN AEROBIC AND ANAEROBIC Blood Culture adequate volume   Culture   Final    NO GROWTH 5 DAYS Performed at [facility], [address] [place], [place] [address]    Report Status 07/24/2021 FINAL  Final  Blood culture (routine x 2)     Status: None   Collection Time: 07/19/21  7:18 AM   Specimen: BLOOD  Result Value Ref Range Status   Specimen Description BLOOD BLOOD LEFT HAND  Final    Special Requests   Final    BOTTLES DRAWN AEROBIC AND ANAEROBIC Blood Culture adequate volume   Culture   Final    NO GROWTH 5 DAYS Performed at [facility], [address] [place], [place] [address]    Report Status 07/24/2021 FINAL  Final         Radiology Studies: No results found.      Scheduled Meds:  acidophilus  1 capsule Oral TID   cefdinir  300 mg Oral Q12H   cholecalciferol  1,000 Units Oral Daily   dicyclomine  10 mg Oral QID   enoxaparin (LOVENOX) injection  40 mg Subcutaneous Q24H   metroNIDAZOLE  500 mg Oral Q12H   pantoprazole  20 mg Oral Daily   polyethylene glycol  17 g Oral BID   rosuvastatin  10 mg Oral Once per day on Mon Wed Fri   sodium chloride flush  10 mL Intracatheter QHS   Continuous  Infusions:     LOS: 5 days    Time spent: 35 minutes    Barb Merino, MD [facility] Pager [phone]

## 2021-07-25 ENCOUNTER — Ambulatory Visit: Payer: Medicare Other | Admitting: Dermatology

## 2021-07-25 DIAGNOSIS — K5720 Diverticulitis of large intestine with perforation and abscess without bleeding: Secondary | ICD-10-CM | POA: Diagnosis not present

## 2021-07-25 DIAGNOSIS — A419 Sepsis, unspecified organism: Secondary | ICD-10-CM | POA: Diagnosis not present

## 2021-07-25 DIAGNOSIS — J9621 Acute and chronic respiratory failure with hypoxia: Secondary | ICD-10-CM

## 2021-07-25 DIAGNOSIS — E782 Mixed hyperlipidemia: Secondary | ICD-10-CM | POA: Diagnosis not present

## 2021-07-25 DIAGNOSIS — I959 Hypotension, unspecified: Secondary | ICD-10-CM

## 2021-07-25 DIAGNOSIS — J449 Chronic obstructive pulmonary disease, unspecified: Secondary | ICD-10-CM | POA: Diagnosis not present

## 2021-07-25 MED ORDER — METRONIDAZOLE 500 MG PO TABS: 500.0000 mg | ORAL_TABLET | Freq: Two times a day (BID) | ORAL | 0 refills | Status: DC

## 2021-07-25 MED ORDER — CEFDINIR 300 MG PO CAPS: 300.0000 mg | ORAL_CAPSULE | Freq: Two times a day (BID) | ORAL | 0 refills | Status: DC

## 2021-07-25 MED ORDER — RISAQUAD PO CAPS: 1.0000 | ORAL_CAPSULE | Freq: Three times a day (TID) | ORAL | 0 refills | Status: DC

## 2021-07-25 NOTE — Plan of Care (Signed)
Pt AAOx4, moderate pain to drain site. BP is low this morning, MD aware. Other VS are stable. Plan for discharge later today. Bed is in lowest position, call light within reach. Will continue to monitor.

## 2021-07-25 NOTE — Discharge Summary (Signed)
Physician Discharge Summary   Patient: Brianna Briggs MRN: [id number] DOB: Oct 08, 1950  Admit date:     07/19/2021  Discharge date: 07/25/21  Discharge Physician: Loletha Grayer   PCP: Kirk Ruths, MD   Recommendations at discharge:   Follow-up PCP Follow-up general surgery  Discharge Diagnoses: Principal Problem:   Colonic diverticular abscess Active Problems:   Sepsis due to undetermined organism ([place])   Hyperlipidemia, mixed   Chronic obstructive pulmonary disease (COPD) ([place]) Acute on chronic hypoxic respiratory failure.  The patient desaturated with ambulation and requires oxygen 2 L nasal cannula chronically  Hospital Course: Patient was admitted on 07/19/2021 with abdominal pain.  The patient was found to have a colonic diverticular abscess.  The patient was started on cefepime and Flagyl.  Dr. Windell Moment from general surgery was consulted.  The patient has a drain in her left lower quadrant.  CT scan of the abdomen and pelvis shows persistent diverticulitis in the sigmoid colon with a 3 loculated air-fluid collection consistent with progressive abscess formation.  Patient was seen in consultation by infectious disease also.  Patient was on IV antibiotics and switched over to oral antibiotics.  Patient was seen by general surgery on the day of discharge and cleared to go home on oral antibiotics and probiotics.  Assessment and Plan: * Colonic diverticular abscess The patient was on IV antibiotics during the hospital course and switched over to oral antibiotics.  Patient was able to tolerate oral antibiotics.  Patient was discharged home on Flagyl for 14 days and Omnicef for 10 days.  Patient also prescribed a probiotic.  Patient will likely need a surgical procedure in the future.  Sepsis due to undetermined organism [facility]) Clinical sepsis present on admission as documented by admitting physician  Hyperlipidemia, mixed - We will continue statin therapy.  Chronic  obstructive pulmonary disease (COPD) (H[facility]) - Continue usual inhalers  Acute on chronic hypoxic respiratory failure The patient will be set up with home oxygen.  The patient desaturated in the low 80s with ambulation.  The patient was able to hold her sats with ambulation on 2 L.  Relative hypotension. Blood pressure low the entire hospital stay         Consultants: General surgery infectious disease Procedures performed: None Disposition: Home health Diet recommendation:  Regular diet DISCHARGE MEDICATION: Allergies as of 07/25/2021       Reactions   Amoxicillin-pot Clavulanate Diarrhea, Rash   Other reaction(s): Unknown   Biaxin [clarithromycin] Diarrhea   Other reaction(s): Unknown   Oxycodone Itching   Codeine Rash   Nitrofurantoin Rash        Medication List     STOP taking these medications    DSS 100 MG Caps   lidocaine 2 % jelly Commonly known as: XYLOCAINE   predniSONE 5 MG tablet Commonly known as: DELTASONE       TAKE these medications    acidophilus Caps capsule Take 1 capsule by mouth 3 (three) times daily for 14 days.   cefdinir 300 MG capsule Commonly known as: OMNICEF Take 1 capsule (300 mg total) by mouth every 12 (twelve) hours for 10 days.   Cholecalciferol 25 MCG (1000 UT) capsule Take 1,000 Units by mouth daily.   Coenzyme Q10 100 MG capsule Take 100 mg by mouth 2 (two) times daily.   dicyclomine 10 MG capsule Commonly known as: BENTYL Take 10 mg by mouth 4 (four) times daily.   metroNIDAZOLE 500 MG tablet Commonly known as: FLAGYL Take 1  tablet (500 mg total) by mouth every 12 (twelve) hours for 14 days.   nystatin powder Commonly known as: MYCOSTATIN/NYSTOP Apply topically 2 (two) times daily.   rosuvastatin 10 MG tablet Commonly known as: CRESTOR Take 10 mg by mouth 3 (three) times a week.   sodium chloride flush 0.9 % Soln injection 10 mLs by Intracatheter route 3 (three) times daily for 60 days   traMADol 50  MG tablet Commonly known as: ULTRAM Take 50 mg by mouth every 6 (six) hours as needed for moderate pain.   Ventolin HFA 108 (90 Base) MCG/ACT inhaler Generic drug: albuterol Inhale 2 puffs into the lungs 2 (two) times daily.               Durable Medical Equipment  (From admission, onward)           Start     Ordered   07/25/21 1325  For home use only DME oxygen  Once       Question Answer Comment  Length of Need Lifetime   Mode or (Route) Nasal cannula   Liters per Minute 2   Frequency Continuous (stationary and portable oxygen unit needed)   Oxygen conserving device Yes   Oxygen delivery system Gas      07/25/21 1324   07/25/21 1006  For home use only DME Walker rolling  Once       Question Answer Comment  Walker: With [place] Wheels   Patient needs a walker to treat with the following condition Generalized weakness      07/25/21 1005            Follow-up Information     Mosheim, Isami, DO Follow up on 08/08/2021.   Specialty: Surgery Why: Follow up for diverticulitis appointment@ 3:00 Contact information: [place] [address] [phone]         [facility] Follow up.   Why: They will resume home health services at discharge.               Discharge Exam: Filed Weights   07/18/21 1941  Weight: 57.2 kg   Physical Exam HENT:     Head: Normocephalic.     Mouth/Throat:     Pharynx: No oropharyngeal exudate.  Eyes:     General: Lids are normal.     Conjunctiva/sclera: Conjunctivae normal.  Cardiovascular:     Rate and Rhythm: Normal rate and regular rhythm.     Heart sounds: Normal heart sounds, S1 normal and S2 normal.  Pulmonary:     Breath sounds: No decreased breath sounds, wheezing, rhonchi or rales.  Abdominal:     Palpations: Abdomen is soft.     Tenderness: There is no abdominal tenderness.  Musculoskeletal:     Right lower leg: No swelling.     Left lower leg: No swelling.  Skin:    General:  Skin is warm.     Findings: No rash.  Neurological:     Mental Status: She is alert and oriented to person, place, and time.      Condition at discharge: fair  The results of significant diagnostics from this hospitalization (including imaging, microbiology, ancillary and laboratory) are listed below for reference.   Imaging Studies: CT ABDOMEN PELVIS W CONTRAST  Result Date: 07/19/2021 CLINICAL DATA:  Lower abdominal pain and rectal pain for several days, initial encounter EXAM: CT ABDOMEN AND PELVIS WITH CONTRAST TECHNIQUE: Multidetector CT imaging of the abdomen and pelvis was performed using  the standard protocol following bolus administration of intravenous contrast. RADIATION DOSE REDUCTION: This exam was performed according to the departmental dose-optimization program which includes automated exposure control, adjustment of the mA and/or kV according to patient size and/or use of iterative reconstruction technique. CONTRAST:  36m OMNIPAQUE IOHEXOL 300 MG/ML  SOLN COMPARISON:  06/23/2021 FINDINGS: Lower chest: No acute abnormality. Hepatobiliary: No focal liver abnormality is seen. No gallstones, gallbladder wall thickening, or biliary dilatation. Pancreas: Unremarkable. No pancreatic ductal dilatation or surrounding inflammatory changes. Spleen: Normal in size without focal abnormality. Adrenals/Urinary Tract: Adrenal glands are within normal limits. 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 Right kidney is chronically atrophic. Bladder is decompressed. Scarring in the left kidney is noted. Stomach/Bowel: Changes of diverticulitis are again seen in the sigmoid colon similar to that noted on the prior exam. Drainage catheter is again identified along side the sigmoid colon. No residual abscess is seen. The degree of inflammatory change surrounding the sigmoid has progressed in the interval from the prior exam. A mildly irregular fluid collection is now seen along the anterior aspect of  the sigmoid this is best visualized on images 71 through 79 on series 6 and image number 52 of series 2. Some mild adjacent thickening of small bowel loops is noted related to the progressive inflammatory change. More proximal colon is within normal limits. The appendix is within normal limits. Small bowel is otherwise within normal limits. Stomach is unremarkable. Vascular/Lymphatic: Aortic atherosclerosis. No enlarged abdominal or pelvic lymph nodes. Reproductive: Status post hysterectomy. No adnexal masses. Other: No abdominal wall hernia or abnormality. No abdominopelvic ascites. Musculoskeletal: No acute or significant osseous findings. IMPRESSION: Persistent diverticulitis of the sigmoid colon. The previously drained fluid collection has resolved with the catheter in place. There is however a new multiloculated air-fluid collection consistent with progressive abscess formation identified along the anterior aspect of the sigmoid colon with some inflammatory involvement of adjacent loops of small bowel. No definitive fistulization is noted. Subcentimeter left adrenal mass, consistent with benign myelolipoma. No follow-up imaging is recommended. JACR 2017 Aug; 14(8):1038-44, JCAT 2016 Mar-Apr; 40(2):194-200, Urol J 2006 Spring; 3(2):71-4. Chronic atrophy of the right kidney. Electronically Signed   By: MInez CatalinaM.D.   On: 07/19/2021 02:39   DG Abdomen 1 View  Result Date: 07/18/2021 CLINICAL DATA:  Abdominal pain, history of diverticulitis EXAM: ABDOMEN - 1 VIEW COMPARISON:  06/11/2021 FINDINGS: Scattered large and small bowel gas is noted. No obstructive changes are seen. Prior drainage catheter is again seen in the left lower quadrant stable from the prior exam. No acute bony abnormality is seen. IMPRESSION: No acute abnormality noted. Electronically Signed   By: MInez CatalinaM.D.   On: 07/18/2021 20:25   DG Sinus/Fist Tube Chk-Non GI  Result Date: 07/14/2021 INDICATION: Drain check, diverticular  abscess EXAM: Drain check using fluoroscopy MEDICATIONS: Per EMR ANESTHESIA/SEDATION: None COMPLICATIONS: None immediate. PROCEDURE: Informed written consent was obtained from the patient after a thorough discussion of the procedural risks, benefits and alternatives. All questions were addressed. Maximal Sterile Barrier Technique was utilized including caps, mask, sterile gowns, sterile gloves, sterile drape, hand hygiene and skin antiseptic. A timeout was performed prior to the initiation of the procedure. The patient was placed supine on the exam table. The left lower quadrant drainage catheter was injected using iodinated contrast material. Images demonstrate appropriate location in the left lower quadrant. There is a decompressed cavity. There is a patent fistula with communication to the adjacent sigmoid colon. There is  small volume reflux through the percutaneous tract towards the end of the injection. The drainage catheter was attached to bag drainage. The patient tolerated the procedure well. IMPRESSION: Drain check demonstrates decompressed cavity with patent fistula to the adjacent sigmoid colon. Drainage catheter remains in place to bag drainage. Patient will follow-up with interventional radiology in another 2-3 weeks for repeat drain check. Continue surgical follow-up. Electronically Signed   By: Albin Felling M.D.   On: 07/14/2021 14:54   IR Radiologist Eval & Mgmt  Result Date: 07/14/2021 EXAM: NEW PATIENT OFFICE VISIT CHIEF COMPLAINT: Refer to separate dictation HISTORY OF PRESENT ILLNESS: Refer to separate dictation REVIEW OF SYSTEMS: Refer to separate dictation PHYSICAL EXAMINATION: Refer to separate dictation ASSESSMENT AND PLAN: Refer to separate dictation Electronically Signed   By: Albin Felling M.D.   On: 07/14/2021 13:53   IR Catheter Tube Change  Result Date: 06/29/2021 INDICATION: Diverticular abscess with drain malpositioned EXAM: Exchange of abdominopelvic drainage catheter  using fluoroscopic guidance MEDICATIONS: Per EMR ANESTHESIA/SEDATION: Moderate (conscious) sedation was employed during this procedure. A total of Versed 0.5 mg and Fentanyl 25 mcg was administered intravenously by the radiology nurse. Total intra-service moderate Sedation Time: 10 minutes. The patient's level of consciousness and vital signs were monitored continuously by radiology nursing throughout the procedure under my direct supervision. COMPLICATIONS: None immediate. FLUOROSCOPY: FLUOROSCOPY Fluoroscopic time: 0.6 minutes (4 mGy) PROCEDURE: Informed written consent was obtained from the patient after a thorough discussion of the procedural risks, benefits and alternatives. All questions were addressed. Maximal Sterile Barrier Technique was utilized including caps, mask, sterile gowns, sterile gloves, sterile drape, hand hygiene and skin antiseptic. A timeout was performed prior to the initiation of the procedure. The patient was placed supine on the exam table. The left lower quadrant was prepped and draped in the standard sterile fashion with inclusion of the existing drainage catheter within the sterile field. 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 The locking loop was released, and over an Amplatz wire, a new 10 [place] multipurpose drainage catheter was advanced into the decompressed cavity, with more appropriate positioning of the distal sideholes. Locking loop was formed, and repeat injection demonstrated appropriate location within the decompressed cavity and persistent fistula. The catheter was secured to the skin using silk suture and a dressing, and was placed to bag drainage. The patient tolerated the procedure well without immediate complication. IMPRESSION: Successful exchange and repositioning of left lower quadrant abdominopelvic drainage catheter for treatment of  diverticular fistula. Colonic fistula remains patent on today's exam. Patient will return for 2 week drain check. Additional management per surgery. Electronically Signed   By: Albin Felling M.D.   On: 06/29/2021 10:47    Microbiology: Results for orders placed or performed during the hospital encounter of 07/19/21  Blood culture (routine x 2)     Status: None   Collection Time: 07/19/21  4:46 AM   Specimen: BLOOD  Result Value Ref Range Status   Specimen Description BLOOD LEFT FA  Final   Special Requests   Final    BOTTLES DRAWN AEROBIC AND ANAEROBIC Blood Culture adequate volume   Culture   Final    NO GROWTH 5 DAYS Performed at [facility], [address] [place], [place] [address]    Report Status 07/24/2021 FINAL  Final  Blood culture (routine x 2)     Status: None   Collection Time:  07/19/21  7:18 AM   Specimen: BLOOD  Result Value Ref Range Status   Specimen Description BLOOD BLOOD LEFT HAND  Final   Special Requests   Final    BOTTLES DRAWN AEROBIC AND ANAEROBIC Blood Culture adequate volume   Culture   Final    NO GROWTH 5 DAYS Performed at [facility], [address] [place], [place] [address]    Report Status 07/24/2021 FINAL  Final    Labs: CBC: Recent Labs  Lab 07/18/21 1944 07/19/21 0445 07/22/21 0434  WBC 17.1* 13.2* 9.5  NEUTROABS  --   --  7.3  HGB 14.6 12.6 11.2*  HCT 45.2 39.2 35.4*  MCV 87.4 88.5 89.4  PLT 388 300 470   Basic Metabolic Panel: Recent Labs  Lab 07/18/21 1944 07/19/21 0445 07/22/21 0434  NA 132* 134* 137  K 4.2 4.1 3.7  CL 92* 100 104  CO2 '28 27 29  '$ GLUCOSE 127* 99 78  BUN 11 10 <5*  CREATININE 0.87 0.72 0.45  CALCIUM 9.2 8.5* 8.2*  MG  --   --  1.7  PHOS  --   --  2.5   Liver Function Tests: Recent Labs  Lab 07/18/21 1944 07/22/21 0434  AST 19 12*  ALT 12 9  ALKPHOS 75 45  BILITOT 0.9 0.6  PROT 7.6 5.1*  ALBUMIN 2.9* 2.0*     Discharge time spent: greater than 30  minutes.  Signed: Loletha Grayer, MD [facility] 07/25/2021

## 2021-07-25 NOTE — Progress Notes (Signed)
Patient ID: Brianna Briggs, female   DOB: 01/04/1951, 71 y.o.   MRN: [id number]     [place] Hospital Day(s): 6.   Interval History: Patient seen and examined, no acute events or new complaints overnight. Patient reports she did not have the epigastric burning yesterday with the oral antibiotics.  She thinks that the probiotics help to tolerate the antibiotics.  She denies any nausea or vomiting.  Pain is at baseline.  She says that the pain is mainly from the drain.  She tolerated solid diet.  Vital signs in last 24 hours: [min-max] current  Temp:  [97.5 F (36.4 C)-98.1 F (36.7 C)] 97.7 F (36.5 C) (07/10 0839) Pulse Rate:  [56-75] 56 (07/10 0839) Resp:  [16-18] 16 (07/10 0839) BP: (81-98)/(52-67) 89/66 (07/10 0839) SpO2:  [94 %-98 %] 96 % (07/10 0839)     Height: '5\' 2"'$  (157.5 cm) Weight: 57.2 kg BMI (Calculated): 23.04   Physical Exam:  Constitutional: alert, cooperative and no distress  Respiratory: breathing non-labored at rest  Cardiovascular: regular rate and sinus rhythm  Gastrointestinal: soft, mild-tender in the left lower quadrant, and non-distended  Labs:     Latest Ref Rng & Units 07/22/2021    4:34 AM 07/19/2021    4:45 AM 07/18/2021    7:44 PM  CBC  WBC 4.0 - 10.5 K/uL 9.5  13.2  17.1   Hemoglobin 12.0 - 15.0 g/dL 11.2  12.6  14.6   Hematocrit 36.0 - 46.0 % 35.4  39.2  45.2   Platelets 150 - 400 K/uL 246  300  388       Latest Ref Rng & Units 07/22/2021    4:34 AM 07/19/2021    4:45 AM 07/18/2021    7:44 PM  CMP  Glucose 70 - 99 mg/dL 78  99  127   BUN 8 - 23 mg/dL '5  10  11   '$ Creatinine 0.44 - 1.00 mg/dL 0.45  0.72  0.87   Sodium 135 - 145 mmol/L 137  134  132   Potassium 3.5 - 5.1 mmol/L 3.7  4.1  4.2   Chloride 98 - 111 mmol/L 104  100  92   CO2 22 - 32 mmol/L '29  27  28   '$ Calcium 8.9 - 10.3 mg/dL 8.2  8.5  9.2   Total Protein 6.5 - 8.1 g/dL 5.1   7.6   Total Bilirubin 0.3 - 1.2 mg/dL 0.6   0.9   Alkaline Phos 38 - 126 U/L 45   75   AST  15 - 41 U/L 12   19   ALT 0 - 44 U/L 9   12     Imaging studies: No new pertinent imaging studies   Assessment/Plan:  71 y.o. female with recurrent diverticulitis, complicated by pertinent comorbidities including coronary artery disease, peripheral artery disease.  Complicated diverticulitis with abscess -There has been no clinical deterioration.  Patient without fevers. -Pain control with current pain medication antibiotic therapy -Patient has been tolerating oral antibiotic therapy.  I recommend to continue with probiotics.  I sent prescriptions for antibiotics and probiotics -Patient currently being evaluated to see if she needs oxygen at home. -Otherwise from the surgical standpoint patient can be discharged with 2 weeks of oral antibiotic therapy and we will follow-up as outpatient to continue discussion of partial colectomy.  Arnold Long, MD

## 2021-07-25 NOTE — TOC Initial Note (Signed)
Transition of Care [facility]) - Initial/Assessment Note    Patient Details  Name: Brianna Briggs MRN: [id number] Date of Birth: 05-11-1950  Transition of Care [facility]) CM/SW Contact:    Candie Chroman, LCSW Phone Number: 07/25/2021, 10:38 AM  Clinical Narrative:  CSW called patient in the room, introduced role, and explained that discharge planning would be discussed. Per MD, she will likely discharge home today. Patient is agreeable to RW. Ordered through [employer]. She was active with [facility] for PT only prior to admission. Home health order is in.                Expected Discharge Plan: [place] Barriers to Discharge: No Barriers Identified   Patient Goals and CMS Choice     Choice offered to / list presented to : NA  Expected Discharge Plan and Services Expected Discharge Plan: [place] Acute Care Choice: Durable Medical Equipment, Resumption of Svcs/PTA Provider Living arrangements for the past 2 months: Single Family Home                 DME Arranged: Walker rolling DME Agency: [facility] Date DME Agency Contacted: 07/25/21   Representative spoke with at DME Agency: [place]: PT [place]: [place] ([place]) Date [place]: 07/25/21   Representative spoke with at [place]: Floydene Flock  Prior Living Arrangements/Services Living arrangements for the past 2 months: Single Family Home Lives with:: Spouse Patient language and need for interpreter reviewed:: Yes Do you feel safe going back to the place where you live?: Yes      Need for Family Participation in Patient Care: Yes (Comment) Care giver support system in place?: Yes (comment) Current home services: Home PT Criminal Activity/Legal Involvement Pertinent to Current Situation/Hospitalization: No - Comment as needed  Activities of Daily Living Home Assistive Devices/Equipment: None ADL Screening (condition at time of  admission) Patient's cognitive ability adequate to safely complete daily activities?: Yes Is the patient deaf or have difficulty hearing?: No Does the patient have difficulty seeing, even when wearing glasses/contacts?: No Does the patient have difficulty concentrating, remembering, or making decisions?: No Patient able to express need for assistance with ADLs?: Yes Does the patient have difficulty dressing or bathing?: No Independently performs ADLs?: Yes (appropriate for developmental age) Does the patient have difficulty walking or climbing stairs?: No Weakness of Legs: None Weakness of Arms/Hands: None  Permission Sought/Granted                  Emotional Assessment Appearance:: Appears stated age Attitude/Demeanor/Rapport: Engaged, Gracious Affect (typically observed): Accepting, Appropriate, Calm, Pleasant Orientation: : Oriented to Self, Oriented to Place, Oriented to  Time, Oriented to Situation Alcohol / Substance Use: Not Applicable Psych Involvement: No (comment)  Admission diagnosis:  Diverticulitis [K57.92] Colonic diverticular abscess [K57.20] Sepsis, due to unspecified organism, unspecified whether acute organ dysfunction present [facility]) [A41.9] Patient Active Problem List   Diagnosis Date Noted   Colonic diverticular abscess 07/19/2021   Sepsis due to undetermined organism ([place]) 07/19/2021   Chronic obstructive pulmonary disease (COPD) ([place]) 07/19/2021   Diverticulitis large intestine 06/06/2021   Diverticulitis of large intestine with abscess without bleeding 07/17/2019   Chronic eczema 08/30/2015   Health care maintenance 03/01/2015   Simple chronic bronchitis ([place]) 03/01/2015   Shortness of breath 12/16/2014   Hyperlipidemia, mixed 06/04/2014   Hypercholesterolemia 02/26/2014   PVC (premature ventricular contraction) 09/04/2013  Tobacco abuse 09/04/2013   PCP:  Kirk Ruths, MD Pharmacy:   [id number] [id number]: [phone]: [phone]    Social Determinants of Health (SDOH) Interventions    Readmission Risk Interventions     No data to display

## 2021-07-25 NOTE — TOC Transition Note (Signed)
Transition of Care [facility]) - CM/SW Discharge Note   Patient Details  Name: Lurleen Soltero MRN: [id number] Date of Birth: [birth year]  [facility]) CM/SW Contact:  Candie Chroman, LCSW Phone Number: 07/25/2021, 1:29 PM   Clinical Narrative:  Patient has orders to discharge home today. [place] representative is aware. Gilford Rile has been delivered to the room. Ordered oxygen through [employer] as well. No further concerns. CSW signing off.   Final next level of care: [place] Barriers to Discharge: No Barriers Identified   Patient Goals and CMS Choice     Choice offered to / list presented to : NA  Discharge Placement                    Patient and family notified of of transfer: 07/25/21  Discharge Plan and Services     Post Acute Care Choice: Durable Medical Equipment, Resumption of Svcs/PTA Provider          DME Arranged: Gilford Rile rolling, Oxygen DME Agency: [facility] Date DME Agency Contacted: 07/25/21   Representative spoke with at DME Agency: [place]: PT [place]: [place] ([place]) Date [place]: 07/25/21   Representative spoke with at [place]: Floydene Flock  Social Determinants of Health (SDOH) Interventions     Readmission Risk Interventions     No data to display

## 2021-07-25 NOTE — Plan of Care (Signed)
AVS and education provided. IV removed with no complications. VS are stable. Pt transported by [facility] to main lobby where husband will transport her home.

## 2021-07-25 NOTE — Progress Notes (Addendum)
Patient ambulated in hallway without oxygen Laying down - 93% Sitting down - 92% [place] ranges from 79-82  Placed on 2 L O2  Sitting 97% Walking 92-95%

## 2021-07-25 NOTE — Progress Notes (Signed)
ID Pt doing better No pain abdomen Ready to go home Tolerating PO antibiotics  O/e Patient Vitals for the past 24 hrs:  BP Temp Temp src Pulse Resp SpO2  07/25/21 0839 (!) 89/66 97.7 F (36.5 C) -- (!) 56 16 96 %  07/25/21 0311 96/67 (!) 97.5 F (36.4 C) Oral 75 16 94 %  07/24/21 2134 98/66 -- -- -- -- --  07/24/21 1954 (!) 81/52 98.1 F (36.7 C) Oral 63 16 97 %  07/24/21 1604 (!) 91/59 98 F (36.7 C) -- 70 18 98 %    Labs    Latest Ref Rng & Units 07/22/2021    4:34 AM 07/19/2021    4:45 AM 07/18/2021    7:44 PM  CBC  WBC 4.0 - 10.5 K/uL 9.5  13.2  17.1   Hemoglobin 12.0 - 15.0 g/dL 11.2  12.6  14.6   Hematocrit 36.0 - 46.0 % 35.4  39.2  45.2   Platelets 150 - 400 K/uL 246  300  388        Latest Ref Rng & Units 07/22/2021    4:34 AM 07/19/2021    4:45 AM 07/18/2021    7:44 PM  CMP  Glucose 70 - 99 mg/dL 78  99  127   BUN 8 - 23 mg/dL '5  10  11   '$ Creatinine 0.44 - 1.00 mg/dL 0.45  0.72  0.87   Sodium 135 - 145 mmol/L 137  134  132   Potassium 3.5 - 5.1 mmol/L 3.7  4.1  4.2   Chloride 98 - 111 mmol/L 104  100  92   CO2 22 - 32 mmol/L '29  27  28   '$ Calcium 8.9 - 10.3 mg/dL 8.2  8.5  9.2   Total Protein 6.5 - 8.1 g/dL 5.1   7.6   Total Bilirubin 0.3 - 1.2 mg/dL 0.6   0.9   Alkaline Phos 38 - 126 U/L 45   75   AST 15 - 41 U/L 12   19   ALT 0 - 44 U/L 9   12        Impression/recommendation  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

## 2021-07-25 NOTE — Care Management Important Message (Signed)
Important Message  Patient Details  Name: Brianna Briggs MRN: [id number] Date of Birth: 12/25/1950   Medicare Important Message Given:  Yes     [name] 07/25/2021, 11:54 AM

## 2021-07-27 ENCOUNTER — Encounter: Payer: Self-pay | Admitting: Internal Medicine

## 2021-07-27 ENCOUNTER — Inpatient Hospital Stay
Admission: EM | Admit: 2021-07-27 | Discharge: 2021-08-16 | DRG: 853 | Disposition: A | Discharge status: Skilled Nursing Facility | Payer: Medicare Other | Attending: Internal Medicine | Admitting: Internal Medicine

## 2021-07-27 ENCOUNTER — Other Ambulatory Visit: Payer: Self-pay

## 2021-07-27 ENCOUNTER — Emergency Department: Payer: Medicare Other

## 2021-07-27 DIAGNOSIS — K650 Generalized (acute) peritonitis: Secondary | ICD-10-CM | POA: Diagnosis present

## 2021-07-27 DIAGNOSIS — M545 Low back pain, unspecified: Secondary | ICD-10-CM | POA: Diagnosis present

## 2021-07-27 DIAGNOSIS — F17210 Nicotine dependence, cigarettes, uncomplicated: Secondary | ICD-10-CM | POA: Diagnosis present

## 2021-07-27 DIAGNOSIS — J1282 Pneumonia due to coronavirus disease 2019: Secondary | ICD-10-CM | POA: Diagnosis not present

## 2021-07-27 DIAGNOSIS — R1012 Left upper quadrant pain: Secondary | ICD-10-CM | POA: Diagnosis not present

## 2021-07-27 DIAGNOSIS — Z85828 Personal history of other malignant neoplasm of skin: Secondary | ICD-10-CM

## 2021-07-27 DIAGNOSIS — K5792 Diverticulitis of intestine, part unspecified, without perforation or abscess without bleeding: Secondary | ICD-10-CM | POA: Diagnosis not present

## 2021-07-27 DIAGNOSIS — J440 Chronic obstructive pulmonary disease with acute lower respiratory infection: Secondary | ICD-10-CM | POA: Diagnosis not present

## 2021-07-27 DIAGNOSIS — I10 Essential (primary) hypertension: Secondary | ICD-10-CM | POA: Diagnosis present

## 2021-07-27 DIAGNOSIS — K5720 Diverticulitis of large intestine with perforation and abscess without bleeding: Secondary | ICD-10-CM | POA: Diagnosis present

## 2021-07-27 DIAGNOSIS — E876 Hypokalemia: Secondary | ICD-10-CM | POA: Diagnosis not present

## 2021-07-27 DIAGNOSIS — Z809 Family history of malignant neoplasm, unspecified: Secondary | ICD-10-CM

## 2021-07-27 DIAGNOSIS — Z9011 Acquired absence of right breast and nipple: Secondary | ICD-10-CM

## 2021-07-27 DIAGNOSIS — J9811 Atelectasis: Secondary | ICD-10-CM | POA: Diagnosis not present

## 2021-07-27 DIAGNOSIS — I739 Peripheral vascular disease, unspecified: Secondary | ICD-10-CM | POA: Diagnosis present

## 2021-07-27 DIAGNOSIS — Z6820 Body mass index (BMI) 20.0-20.9, adult: Secondary | ICD-10-CM

## 2021-07-27 DIAGNOSIS — Z720 Tobacco use: Secondary | ICD-10-CM

## 2021-07-27 DIAGNOSIS — Z7952 Long term (current) use of systemic steroids: Secondary | ICD-10-CM

## 2021-07-27 DIAGNOSIS — I2510 Atherosclerotic heart disease of native coronary artery without angina pectoris: Secondary | ICD-10-CM | POA: Diagnosis present

## 2021-07-27 DIAGNOSIS — R54 Age-related physical debility: Secondary | ICD-10-CM | POA: Diagnosis present

## 2021-07-27 DIAGNOSIS — Z881 Allergy status to other antibiotic agents status: Secondary | ICD-10-CM

## 2021-07-27 DIAGNOSIS — K5780 Diverticulitis of intestine, part unspecified, with perforation and abscess without bleeding: Secondary | ICD-10-CM | POA: Diagnosis present

## 2021-07-27 DIAGNOSIS — E782 Mixed hyperlipidemia: Secondary | ICD-10-CM

## 2021-07-27 DIAGNOSIS — J449 Chronic obstructive pulmonary disease, unspecified: Secondary | ICD-10-CM | POA: Diagnosis not present

## 2021-07-27 DIAGNOSIS — J9621 Acute and chronic respiratory failure with hypoxia: Secondary | ICD-10-CM | POA: Diagnosis present

## 2021-07-27 DIAGNOSIS — M549 Dorsalgia, unspecified: Secondary | ICD-10-CM | POA: Clinically undetermined

## 2021-07-27 DIAGNOSIS — Z880 Allergy status to penicillin: Secondary | ICD-10-CM

## 2021-07-27 DIAGNOSIS — Z885 Allergy status to narcotic agent status: Secondary | ICD-10-CM

## 2021-07-27 DIAGNOSIS — A419 Sepsis, unspecified organism: Principal | ICD-10-CM

## 2021-07-27 DIAGNOSIS — R109 Unspecified abdominal pain: Secondary | ICD-10-CM | POA: Diagnosis present

## 2021-07-27 DIAGNOSIS — E440 Moderate protein-calorie malnutrition: Secondary | ICD-10-CM | POA: Diagnosis present

## 2021-07-27 DIAGNOSIS — F329 Major depressive disorder, single episode, unspecified: Secondary | ICD-10-CM | POA: Diagnosis present

## 2021-07-27 DIAGNOSIS — U071 COVID-19: Secondary | ICD-10-CM

## 2021-07-27 DIAGNOSIS — Z853 Personal history of malignant neoplasm of breast: Secondary | ICD-10-CM

## 2021-07-27 DIAGNOSIS — K651 Peritoneal abscess: Secondary | ICD-10-CM | POA: Diagnosis present

## 2021-07-27 DIAGNOSIS — Z79899 Other long term (current) drug therapy: Secondary | ICD-10-CM

## 2021-07-27 LAB — COMPREHENSIVE METABOLIC PANEL
ALT: 10 U/L (ref 0–44)
AST: 23 U/L (ref 15–41)
Albumin: 3 g/dL — ABNORMAL LOW (ref 3.5–5.0)
Alkaline Phosphatase: 60 U/L (ref 38–126)
Anion gap: 10 (ref 5–15)
BUN: 6 mg/dL — ABNORMAL LOW (ref 8–23)
CO2: 37 mmol/L — ABNORMAL HIGH (ref 22–32)
Calcium: 9.1 mg/dL (ref 8.9–10.3)
Chloride: 91 mmol/L — ABNORMAL LOW (ref 98–111)
Creatinine, Ser: 0.65 mg/dL (ref 0.44–1.00)
GFR, Estimated: >60 mL/min (ref >60)
Glucose, Bld: 113 mg/dL — ABNORMAL HIGH (ref 70–99)
Potassium: 3.9 mmol/L (ref 3.5–5.1)
Sodium: 138 mmol/L (ref 135–145)
Total Bilirubin: 0.9 mg/dL (ref 0.3–1.2)
Total Protein: 7.6 g/dL (ref 6.5–8.1)

## 2021-07-27 LAB — PROCALCITONIN: Procalcitonin: <0.1 ng/mL

## 2021-07-27 LAB — LACTIC ACID, PLASMA
Lactic Acid, Venous: 1.6 mmol/L (ref 0.5–1.9)
Lactic Acid, Venous: 0.8 mmol/L (ref 0.5–1.9)

## 2021-07-27 LAB — URINALYSIS, ROUTINE W REFLEX MICROSCOPIC
Bilirubin Urine: NEGATIVE
Glucose, UA: NEGATIVE mg/dL
Hgb urine dipstick: NEGATIVE
Ketones, ur: NEGATIVE mg/dL
Leukocytes,Ua: NEGATIVE
Nitrite: NEGATIVE
Protein, ur: NEGATIVE mg/dL
Specific Gravity, Urine: >1.046 — ABNORMAL HIGH (ref 1.005–1.030)
pH: 8 (ref 5.0–8.0)

## 2021-07-27 LAB — CBC WITH DIFFERENTIAL/PLATELET
Abs Immature Granulocytes: 0.06 10*3/uL (ref 0.00–0.07)
Basophils Absolute: 0.1 10*3/uL (ref 0.0–0.1)
Basophils Relative: 0 %
Eosinophils Absolute: 0 10*3/uL (ref 0.0–0.5)
Eosinophils Relative: 0 %
HCT: 45.7 % (ref 36.0–46.0)
Hemoglobin: 14.3 g/dL (ref 12.0–15.0)
Immature Granulocytes: 0 %
Lymphocytes Relative: 10 %
Lymphs Abs: 1.4 10*3/uL (ref 0.7–4.0)
MCH: 28.3 pg (ref 26.0–34.0)
MCHC: 31.3 g/dL (ref 30.0–36.0)
MCV: 90.5 fL (ref 80.0–100.0)
Monocytes Absolute: 1 10*3/uL (ref 0.1–1.0)
Monocytes Relative: 7 %
Neutro Abs: 11.1 10*3/uL — ABNORMAL HIGH (ref 1.7–7.7)
Neutrophils Relative %: 83 %
Platelets: 267 10*3/uL (ref 150–400)
RBC: 5.05 MIL/uL (ref 3.87–5.11)
RDW: 13.9 % (ref 11.5–15.5)
WBC: 13.7 10*3/uL — ABNORMAL HIGH (ref 4.0–10.5)
nRBC: 0 % (ref 0.0–0.2)

## 2021-07-27 MED ORDER — RISAQUAD PO CAPS
1.0000 | ORAL_CAPSULE | Freq: Three times a day (TID) | ORAL | Status: DC
  Administered 2021-07-27 – 2021-08-16 (×57): 1 via ORAL
  Filled 2021-07-27 (×57): qty 1

## 2021-07-27 MED ORDER — IOHEXOL 300 MG/ML  SOLN
100.0000 mL | Freq: Once | INTRAMUSCULAR | Status: AC | PRN
  Administered 2021-07-27: 100 mL via INTRAVENOUS

## 2021-07-27 MED ORDER — METRONIDAZOLE 500 MG/100ML IV SOLN
500.0000 mg | Freq: Two times a day (BID) | INTRAVENOUS | Status: DC
  Administered 2021-07-28 – 2021-08-01 (×9): 500 mg via INTRAVENOUS
  Filled 2021-07-27 (×10): qty 100

## 2021-07-27 MED ORDER — ONDANSETRON HCL 4 MG PO TABS
4.0000 mg | ORAL_TABLET | Freq: Four times a day (QID) | ORAL | Status: DC | PRN
  Administered 2021-07-29 – 2021-08-13 (×2): 4 mg via ORAL
  Filled 2021-07-27 (×2): qty 1

## 2021-07-27 MED ORDER — SENNOSIDES-DOCUSATE SODIUM 8.6-50 MG PO TABS: 1.0000 | ORAL_TABLET | Freq: Every evening | ORAL | Status: DC | PRN

## 2021-07-27 MED ORDER — SODIUM CHLORIDE 0.9 % IV SOLN
2.0000 g | Freq: Once | INTRAVENOUS | Status: AC
  Administered 2021-07-27: 2 g via INTRAVENOUS
  Filled 2021-07-27: qty 20

## 2021-07-27 MED ORDER — METRONIDAZOLE 500 MG/100ML IV SOLN
500.0000 mg | Freq: Once | INTRAVENOUS | Status: AC
  Administered 2021-07-27: 500 mg via INTRAVENOUS
  Filled 2021-07-27: qty 100

## 2021-07-27 MED ORDER — HEPARIN SODIUM (PORCINE) 5000 UNIT/ML IJ SOLN
5000.0000 [IU] | Freq: Three times a day (TID) | INTRAMUSCULAR | Status: DC
Start: 2021-07-28 — End: 2021-08-16
  Administered 2021-07-28 – 2021-08-16 (×57): 5000 [IU] via SUBCUTANEOUS
  Filled 2021-07-27 (×57): qty 1

## 2021-07-27 MED ORDER — SODIUM CHLORIDE 0.9 % IV SOLN
2.0000 g | INTRAVENOUS | Status: DC
  Administered 2021-07-28 – 2021-07-31 (×4): 2 g via INTRAVENOUS
  Filled 2021-07-27: qty 20
  Filled 2021-07-27 (×2): qty 2
  Filled 2021-07-27: qty 20
  Filled 2021-07-27: qty 2

## 2021-07-27 MED ORDER — ACETAMINOPHEN 325 MG PO TABS
650.0000 mg | ORAL_TABLET | Freq: Four times a day (QID) | ORAL | Status: DC | PRN
  Administered 2021-07-30 – 2021-08-01 (×3): 650 mg via ORAL
  Filled 2021-07-27 (×4): qty 2

## 2021-07-27 MED ORDER — MORPHINE SULFATE (PF) 2 MG/ML IV SOLN
2.0000 mg | INTRAVENOUS | Status: AC | PRN
  Administered 2021-07-28 – 2021-08-01 (×5): 2 mg via INTRAVENOUS
  Filled 2021-07-27 (×5): qty 1

## 2021-07-27 MED ORDER — LACTATED RINGERS IV SOLN: INTRAVENOUS | Status: AC

## 2021-07-27 MED ORDER — HYDROMORPHONE HCL 1 MG/ML IJ SOLN: 1.0000 mg | INTRAMUSCULAR | Status: DC | PRN

## 2021-07-27 MED ORDER — NICOTINE 21 MG/24HR TD PT24: 21.0000 mg | MEDICATED_PATCH | Freq: Every day | TRANSDERMAL | Status: DC | PRN

## 2021-07-27 MED ORDER — ONDANSETRON HCL 4 MG/2ML IJ SOLN
4.0000 mg | Freq: Once | INTRAMUSCULAR | Status: AC
  Administered 2021-07-27: 4 mg via INTRAVENOUS
  Filled 2021-07-27: qty 2

## 2021-07-27 MED ORDER — ONDANSETRON HCL 4 MG/2ML IJ SOLN
4.0000 mg | Freq: Four times a day (QID) | INTRAMUSCULAR | Status: DC | PRN
  Administered 2021-07-28 – 2021-08-04 (×6): 4 mg via INTRAVENOUS
  Filled 2021-07-27 (×6): qty 2

## 2021-07-27 MED ORDER — SODIUM CHLORIDE 0.9 % IV BOLUS
500.0000 mL | Freq: Once | INTRAVENOUS | Status: AC
  Administered 2021-07-27: 500 mL via INTRAVENOUS

## 2021-07-27 MED ORDER — SODIUM CHLORIDE 0.9 % IV BOLUS
1000.0000 mL | Freq: Once | INTRAVENOUS | Status: AC
  Administered 2021-07-27: 1000 mL via INTRAVENOUS

## 2021-07-27 MED ORDER — ACETAMINOPHEN 650 MG RE SUPP: 650.0000 mg | Freq: Four times a day (QID) | RECTAL | Status: DC | PRN

## 2021-07-27 MED ORDER — TRAMADOL HCL 50 MG PO TABS: 50.0000 mg | ORAL_TABLET | Freq: Four times a day (QID) | ORAL | Status: DC | PRN

## 2021-07-27 MED ORDER — HYDROMORPHONE HCL 1 MG/ML IJ SOLN
1.0000 mg | INTRAMUSCULAR | Status: AC | PRN
  Administered 2021-07-28 – 2021-08-02 (×4): 1 mg via INTRAVENOUS
  Filled 2021-07-27 (×5): qty 1

## 2021-07-27 NOTE — Assessment & Plan Note (Addendum)
Complicated diverticulitis with abscess.  Status post sigmoid resection with loop ileostomy creation on 07/31/2021. --Diet has been advanced to soft diet per surgery --Continue monitoring oral intake; needs improvement - Continue nausea control - Continue monitoring ileostomy output; adequate -- WOC following for ileostomy care and education -- Continue education and training patient for self-management of ileostomy

## 2021-07-27 NOTE — Assessment & Plan Note (Addendum)
Improved overall.   --Continue pain control PRN with Tramadol for moderate pain, Norco for severe

## 2021-07-27 NOTE — Assessment & Plan Note (Addendum)
-   Nicotine patch as needed ordered for nicotine craving - Counseled patient that tobacco abuse also decreases wound healing

## 2021-07-27 NOTE — Sepsis Progress Note (Signed)
ELink tracking the Code Sepsis. 

## 2021-07-27 NOTE — Progress Notes (Signed)
Patient ID: Brianna Briggs, female   DOB: 08/21/1950, 71 y.o.   MRN: [id number]   SURGICAL CONSULTATION NOTE   HISTORY OF PRESENT ILLNESS (HPI):  71 y.o. female presented to [facility] ED for evaluation of nausea and vomiting. Patient reports she has been dealing with diverticulitis for the last 2 months.  She initially presented with complicated diverticulitis with abscess.  She was treated with percutaneous drainage and IV antibiotic therapy.  She was responding well until 2 weeks ago when she had recurrent pain.  She was again admitted for recurrent pain in the left lower quadrant and treated with antibiotic therapy.  She again improved with IV antibiotic therapy and transition to oral antibiotic therapy.  Once she was discharged home she endorses that she has been having severe burning on the epigastric area with nausea and vomiting.  Pain in the left lower quadrant continued to be stable but not worsening.  At the ED she was found with epigastric and left lower quadrant abdominal pain.  No acute abdomen.  White blood cell count elevated to 13,000.  CT scan of the abdomen and pelvis shows persistent abscess in the left lower quadrant.  This is basically unchanged from previous evaluation.  There is no free air or free fluid.  I personally evaluated the images.  Surgery is consulted by Dr. Tobie Poet in this context for evaluation and management of complicated diverticulitis.  PAST MEDICAL HISTORY (PMH):  Past Medical History:  Diagnosis Date   Allergic genetic state    Breast cancer, right ([place]) 1996   Mastectomy   History of chicken pox    History of colon polyps    Hyperlipidemia    Single kidney    Squamous cell carcinoma of skin 01/27/2019   Right inferior lateral thigh. KA-type     PAST SURGICAL HISTORY [facility] [address]):  Past Surgical History:  Procedure Laterality Date   ABDOMINAL HYSTERECTOMY     COLONOSCOPY WITH PROPOFOL N/A 05/01/2019   Procedure: COLONOSCOPY WITH PROPOFOL;  Surgeon: Toledo,  Benay Pike, MD;  Location: [facility] ENDOSCOPY;  Service: Gastroenterology;  Laterality: N/A;   ESOPHAGOGASTRODUODENOSCOPY (EGD) WITH PROPOFOL N/A 05/01/2019   Procedure: ESOPHAGOGASTRODUODENOSCOPY (EGD) WITH PROPOFOL;  Surgeon: Toledo, Benay Pike, MD;  Location: [facility] ENDOSCOPY;  Service: Gastroenterology;  Laterality: N/A;   IR CATHETER TUBE CHANGE  06/29/2021   IR RADIOLOGIST EVAL & MGMT  06/23/2021   IR RADIOLOGIST EVAL & MGMT  07/14/2021   MASTECTOMY     SHOULDER ARTHROSCOPY WITH SUBACROMIAL DECOMPRESSION, ROTATOR CUFF REPAIR AND BICEP TENDON REPAIR Left 01/13/2008   TONSILLECTOMY     TUBAL LIGATION       MEDICATIONS:  Prior to Admission medications   Medication Sig Start Date End Date Taking? Authorizing Provider  acidophilus (RISAQUAD) CAPS capsule Take 1 capsule by mouth 3 (three) times daily for 14 days. 07/25/21 08/08/21  Herbert Pun, MD  cefdinir (OMNICEF) 300 MG capsule Take 1 capsule (300 mg total) by mouth every 12 (twelve) hours for 10 days. 07/25/21 08/04/21  Herbert Pun, MD  Cholecalciferol 25 MCG (1000 UT) capsule Take 1,000 Units by mouth daily. 04/27/21   [provider]  Coenzyme Q10 100 MG capsule Take 100 mg by mouth 2 (two) times daily. 04/27/21   [provider]  dicyclomine (BENTYL) 10 MG capsule Take 10 mg by mouth 4 (four) times daily. 05/31/21   [provider]  metroNIDAZOLE (FLAGYL) 500 MG tablet Take 1 tablet (500 mg total) by mouth every 12 (twelve) hours for 14  days. 07/25/21 08/08/21  Herbert Pun, MD  nystatin (MYCOSTATIN/NYSTOP) powder Apply topically 2 (two) times daily. 05/13/21   [provider]  rosuvastatin (CRESTOR) 10 MG tablet Take 10 mg by mouth 3 (three) times a week. 04/28/21   [provider]  sodium chloride flush 0.9 % SOLN injection 10 mLs by Intracatheter route 3 (three) times daily for 60 days 07/15/21 09/13/21  [provider]  traMADol (ULTRAM) 50 MG tablet Take 50 mg by mouth  every 6 (six) hours as needed for moderate pain. 07/16/21   [provider]  VENTOLIN HFA 108 (90 Base) MCG/ACT inhaler Inhale 2 puffs into the lungs 2 (two) times daily. 04/12/21   [provider]     ALLERGIES:  Allergies  Allergen Reactions   Amoxicillin-Pot Clavulanate Diarrhea and Rash    Other reaction(s): Unknown   Biaxin [Clarithromycin] Diarrhea    Other reaction(s): Unknown   Oxycodone Itching   Codeine Rash   Nitrofurantoin Rash     SOCIAL HISTORY:  Social History   Socioeconomic History   Marital status: Married    Spouse name: Not on file   Number of children: Not on file   Years of education: Not on file   Highest education level: Not on file  Occupational History   Not on file  Tobacco Use   Smoking status: Every Day    Packs/day: 1.50    Years: 40.00    Total pack years: 60.00    Types: Cigarettes   Smokeless tobacco: Never  Vaping Use   Vaping Use: Never used  Substance and Sexual Activity   Alcohol use: No   Drug use: No   Sexual activity: Yes    Partners: Male  Other Topics Concern   Not on file  Social History Narrative   Lives at home with husband   Social Determinants of Health   Financial Resource Strain: Not on file  Food Insecurity: Not on file  Transportation Needs: Not on file  Physical Activity: Not on file  Stress: Not on file  Social Connections: Not on file  Intimate Partner Violence: Not on file      FAMILY HISTORY:  Family History  Problem Relation Age of Onset   Cancer Father    Kidney failure Mother      REVIEW OF SYSTEMS:  Constitutional: denies weight loss, fever, chills, or sweats  Eyes: denies any other vision changes, history of eye injury  ENT: denies sore throat, hearing problems  Respiratory: denies shortness of breath, wheezing  Cardiovascular: denies chest pain, palpitations  Gastrointestinal: Positive abdominal pain, nausea and vomiting Genitourinary: denies burning with urination or  urinary frequency Musculoskeletal: denies any other joint pains or cramps  Skin: denies any other rashes or skin discolorations  Neurological: denies any other headache, dizziness, weakness  Psychiatric: denies any other depression, anxiety   All other review of systems were negative   VITAL SIGNS:  Temp:  [98.3 F (36.8 C)] 98.3 F (36.8 C) (07/12 1338) Pulse Rate:  [92-105] 92 (07/12 1614) Resp:  [18] 18 (07/12 1614) BP: (104-119)/(72-79) 119/79 (07/12 1614) SpO2:  [92 %-94 %] 92 % (07/12 1614)             INTAKE/OUTPUT:  This shift: Total I/O In: 100 [IV Piggyback:100] Out: -   Last 2 shifts: '@IOLAST2SHIFTS'$ @   PHYSICAL EXAM:  Constitutional:  -- Normal body habitus  -- Awake, alert, and oriented x3  Eyes:  -- Pupils equally round and reactive to  light  -- No scleral icterus  Ear, nose, and throat:  -- No jugular venous distension  Pulmonary:  -- No crackles  -- Equal breath sounds bilaterally -- Breathing non-labored at rest Cardiovascular:  -- S1, S2 present  -- No pericardial rubs Gastrointestinal:  -- Abdomen soft, nontender, non-distended, no guarding or rebound tenderness -- No abdominal masses appreciated, pulsatile or otherwise  Musculoskeletal and Integumentary:  -- Wounds: None appreciated -- Extremities: B/L UE and LE FROM, hands and feet warm, no edema  Neurologic:  -- Motor function: intact and symmetric -- Sensation: intact and symmetric   Labs:     Latest Ref Rng & Units 07/27/2021    1:55 PM 07/22/2021    4:34 AM 07/19/2021    4:45 AM  CBC  WBC 4.0 - 10.5 K/uL 13.7  9.5  13.2   Hemoglobin 12.0 - 15.0 g/dL 14.3  11.2  12.6   Hematocrit 36.0 - 46.0 % 45.7  35.4  39.2   Platelets 150 - 400 K/uL 267  246  300       Latest Ref Rng & Units 07/27/2021    1:55 PM 07/22/2021    4:34 AM 07/19/2021    4:45 AM  CMP  Glucose 70 - 99 mg/dL 113  78  99   BUN 8 - 23 mg/dL 6  <5  10   Creatinine 0.44 - 1.00 mg/dL 0.65  0.45  0.72   Sodium 135 - 145 mmol/L  138  137  134   Potassium 3.5 - 5.1 mmol/L 3.9  3.7  4.1   Chloride 98 - 111 mmol/L 91  104  100   CO2 22 - 32 mmol/L 37  29  27   Calcium 8.9 - 10.3 mg/dL 9.1  8.2  8.5   Total Protein 6.5 - 8.1 g/dL 7.6  5.1    Total Bilirubin 0.3 - 1.2 mg/dL 0.9  0.6    Alkaline Phos 38 - 126 U/L 60  45    AST 15 - 41 U/L 23  12    ALT 0 - 44 U/L 10  9      Imaging studies:  I personally evaluated the CT scan of the abdomen and pelvis.  There is persistent diverticular abscess.  I personally think that this is unchanged from previous evaluation.  No free air or free fluid.  Assessment/Plan:  70 y.o. female with recurrent diverticulitis, complicated by pertinent comorbidities including coronary artery disease, peripheral artery disease.  Patient with persistent complex diverticulitis.  She was treated recently with IV antibiotic therapy and transition to oral antibiotic therapy.  She was tolerating oral antibiotic therapy for 48 hours before getting discharged.  At home she started having worsening burning on the epigastric area with nausea and vomiting.  Most likely secondary to not tolerating oral antibiotic therapy.  The complex diverticulitis with abscesses mostly unchanged.  I discussed with patient that at this point most likely she will ended up having surgical intervention with Hartman's procedure.  I recommend to start antibiotic therapy and keep her on clear liquid diet.  Once the pain improves, will start slow bowel prep as tolerated  Appreciate hospitalist admission and management of medical comorbidities and help him optimizing the patient medically for possible surgical intervention.  Arnold Long, MD

## 2021-07-27 NOTE — H&P (Signed)
History and Physical   Brianna Briggs GYI:[id number] DOB: December 05, 1950 DOA: 07/27/2021  PCP: Kirk Ruths, MD  Outpatient Specialists: Dr. Lysle Pearl, general surgery Patient coming from: Home  I have personally briefly reviewed patient's old medical records in [place].  Chief Concern: Nausea, percutaneous drainage  HPI: [name] is a 71 year old female with history of tobacco use, paroxysmal supraventricular tachycardia, CAD of the native heart, history of diverticulitis of the colon status post percutaneous drainage in May 2023, presents emergency department for chief concerns of nausea and unable to tolerate p.o. intake including medications with worsening tenderness and drainage from the abdominal percutaneous drainage.  Initial vitals in the emergency department show temperature of 98.3, respiration rate of 18, heart rate of 105, blood pressure 104/72, SPO2 of 94% on room air.  Serum sodium is 138, potassium 3.9, chloride 91, bicarb 37, BUN of 6, serum creatinine 0.65, nonfasting blood glucose 113, GFR greater than 60, WBC 13.7, hemoglobin 14.3, platelets of 267.  Lactic acid was 1.6.  ED treatment ceftriaxone 2 g IV, metronidazole 500 mg IV one-time dose, ondansetron 4 mg IV one-time dose, sodium chloride 500 mL bolus, LR 1 L bolus ordered  At bedside she is awake alert and oriented to self, age, current location.  She reports that the [facility] [address] drainage has been leaking around the site for the last 10 days.  She endorses intractable nausea that has been persistent along with general abdominal tenderness for the last 2 days.  She denies fever, chest pain, shortness of breath, dysuria, hematuria, diarrhea.  She reports that she had 2 episodes of loose bowel movements on day of admission.  She reports the nausea is persistent especially with taking metronidazole.  She reports that the drainage has been purulent and on and off with more or less purulence since  percutaneous placement.  Social history: She lives at home with her husband.  She is a current tobacco user smoking 2 cigarettes/day.  She denies EtOH and recreational drug use.  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  ROS: Constitutional: no weight change, no fever ENT/Mouth: no sore throat, no rhinorrhea Eyes: no eye pain, no vision changes Cardiovascular: no chest pain, no dyspnea,  no edema, no palpitations Respiratory: no cough, no sputum, no wheezing Gastrointestinal: + nausea, no vomiting, no diarrhea, no constipation Genitourinary: no urinary incontinence, no dysuria, no hematuria Musculoskeletal: no arthralgias, no myalgias Skin: no skin lesions, no pruritus, Neuro: + weakness, no loss of consciousness, no syncope Psych: no anxiety, no depression, + decrease appetite Heme/Lymph: no bruising, no bleeding  ED Course: Discussed with emergency medicine provider, patient requiring hospitalization for chief concerns of meeting sepsis criteria and persistent diverticulitis.  Assessment/Plan  Principal Problem:   Sepsis ([place]) Active Problems:   Hyperlipidemia, mixed   Tobacco abuse   Chronic obstructive pulmonary disease (COPD) (HCC)   Diverticulitis   Abdominal pain   Assessment and Plan:  * Sepsis ([place]) - Patient meets sepsis criteria with increased heart rate, leukocytosis, source of intra-abdominal abscess that is slightly increased - Continue ceftriaxone 2 g daily and metronidazole 500 mg IV twice daily - General surgery has been consulted to Dr. Peyton Najjar via secure chat - Blood cultures x2 have been ordered by EDP and pending collection - Admit to telemetry medical, observation  Abdominal pain - Presumed secondary to complicated chronic diverticulitis status post percutaneous drainage - Pain control: Acetaminophen 650 mg p.o. every 6 hours as needed for mild  pain morphine 2 mg IV every 4 hours as needed for moderate pain, 5 doses  ordered; Dilaudid 1 mg IV every 3 hours as needed for severe pain, 4 doses ordered - AM team to reassess patient at bedside and determine requirement opioid pain control  Diverticulitis Complicated diverticulitis with abscess - Continue with ceftriaxone 2 g IV daily and metronidazole 500 mg IV twice daily - General surgery has been consulted as above  Tobacco abuse - Nicotine patch as needed ordered for nicotine craving - Counseled patient that tobacco abuse also decreases wound healing  Chart reviewed.   Patient was hospitalized from 07/19/2021 to 07/25/2021 for colonic diverticulitis abscess.  General surgery was consulted and saw patient in conjunction with hospitalist service and on day of discharge.  Patient was cleared by general surgery to be discharged home on p.o. antibiotic.  Per discharge summary, patient was discharged home on p.o. Flagyl to complete 14 days course and Omnicef 10 days course.    Patient was also seen to have acute on chronic hypoxic respiratory failure.  Patient desatted in the low 80s with ambulation and discharged home with 2 L nasal cannula.  06/07/2021: Patient had CT-guided visceral fluid drain by per cath placement 06/29/2020: IR catheter to change  DVT prophylaxis: Heparin Code Status: Full code Diet: Heart healthy Family Communication: No Disposition Plan: Pending clinical course; pending general surgery evaluation Consults called: General surgery, Dr. Peyton Najjar Admission status: Telemetry medical, observation  Past Medical History:  Diagnosis Date   Allergic genetic state    Breast cancer, right ([place]) 1996   Mastectomy   History of chicken pox    History of colon polyps    Hyperlipidemia    Single kidney    Squamous cell carcinoma of skin 01/27/2019   Right inferior lateral thigh. KA-type   Past Surgical History:  Procedure Laterality Date   ABDOMINAL HYSTERECTOMY     COLONOSCOPY WITH PROPOFOL N/A 05/01/2019   Procedure: COLONOSCOPY WITH  PROPOFOL;  Surgeon: Toledo, Benay Pike, MD;  Location: [facility] ENDOSCOPY;  Service: Gastroenterology;  Laterality: N/A;   ESOPHAGOGASTRODUODENOSCOPY (EGD) WITH PROPOFOL N/A 05/01/2019   Procedure: ESOPHAGOGASTRODUODENOSCOPY (EGD) WITH PROPOFOL;  Surgeon: Toledo, Benay Pike, MD;  Location: [facility] ENDOSCOPY;  Service: Gastroenterology;  Laterality: N/A;   IR CATHETER TUBE CHANGE  06/29/2021   IR RADIOLOGIST EVAL & MGMT  06/23/2021   IR RADIOLOGIST EVAL & MGMT  07/14/2021   MASTECTOMY     SHOULDER ARTHROSCOPY WITH SUBACROMIAL DECOMPRESSION, ROTATOR CUFF REPAIR AND BICEP TENDON REPAIR Left 01/13/2008   TONSILLECTOMY     TUBAL LIGATION     Social History:  reports that she has been smoking cigarettes. She has a 60.00 pack-year smoking history. She has never used smokeless tobacco. She reports that she does not drink alcohol and does not use drugs.  Allergies  Allergen Reactions   Amoxicillin-Pot Clavulanate Diarrhea and Rash    Other reaction(s): Unknown   Biaxin [Clarithromycin] Diarrhea    Other reaction(s): Unknown   Oxycodone Itching   Codeine Rash   Nitrofurantoin Rash   Family History  Problem Relation Age of Onset   Cancer Father    Kidney failure Mother    Family history: Family history reviewed and not pertinent  Prior to Admission medications   Medication Sig Start Date End Date Taking? Authorizing Provider  acidophilus (RISAQUAD) CAPS capsule Take 1 capsule by mouth 3 (three) times daily for 14 days. 07/25/21 08/08/21  Herbert Pun, MD  cefdinir (OMNICEF) 300 MG capsule Take  1 capsule (300 mg total) by mouth every 12 (twelve) hours for 10 days. 07/25/21 08/04/21  Herbert Pun, MD  Cholecalciferol 25 MCG (1000 UT) capsule Take 1,000 Units by mouth daily. 04/27/21   [provider]  Coenzyme Q10 100 MG capsule Take 100 mg by mouth 2 (two) times daily. 04/27/21   [provider]  dicyclomine (BENTYL) 10 MG capsule Take 10 mg by mouth 4 (four) times daily.  05/31/21   [provider]  metroNIDAZOLE (FLAGYL) 500 MG tablet Take 1 tablet (500 mg total) by mouth every 12 (twelve) hours for 14 days. 07/25/21 08/08/21  Herbert Pun, MD  nystatin (MYCOSTATIN/NYSTOP) powder Apply topically 2 (two) times daily. 05/13/21   [provider]  rosuvastatin (CRESTOR) 10 MG tablet Take 10 mg by mouth 3 (three) times a week. 04/28/21   [provider]  sodium chloride flush 0.9 % SOLN injection 10 mLs by Intracatheter route 3 (three) times daily for 60 days 07/15/21 09/13/21  [provider]  traMADol (ULTRAM) 50 MG tablet Take 50 mg by mouth every 6 (six) hours as needed for moderate pain. 07/16/21   [provider]  VENTOLIN HFA 108 (90 Base) MCG/ACT inhaler Inhale 2 puffs into the lungs 2 (two) times daily. 04/12/21   [provider]   Physical Exam: Vitals:   07/27/21 1338 07/27/21 1614  BP: 104/72 119/79  Pulse: (!) 105 92  Resp: 18 18  Temp: 98.3 F (36.8 C)   TempSrc: Oral   SpO2: 94% 92%   Constitutional: appears age-appropriate, frail, NAD, calm, comfortable Eyes: PERRL, lids and conjunctivae normal ENMT: Mucous membranes are moist. Posterior pharynx clear of any exudate or lesions. Age-appropriate dentition. Hearing appropriate Neck: normal, supple, no masses, no thyromegaly Respiratory: clear to auscultation bilaterally, no wheezing, no crackles. Normal respiratory effort. No accessory muscle use.  Cardiovascular: Regular rate and rhythm, no murmurs / rubs / gallops. No extremity edema. 2+ pedal pulses. No carotid bruits.  Abdomen: Mild general tenderness, no masses palpated, no hepatosplenomegaly. Bowel sounds positive.  Percutaneous drainage in with purulence    Musculoskeletal: no clubbing / cyanosis. No joint deformity upper and lower extremities. Good ROM, no contractures, no atrophy. Normal muscle tone.  Skin: no rashes, lesions, ulcers. No induration Neurologic: Sensation intact.  Strength 5/5 in all 4.  Psychiatric: Normal judgment and insight. Alert and oriented x 3. Normal mood.   EKG: Ordered  Chest x-ray on Admission: Not indicated on admission  Labs on Admission: I have personally reviewed following labs  CBC: Recent Labs  Lab 07/22/21 0434 07/27/21 1355  WBC 9.5 13.7*  NEUTROABS 7.3 11.1*  HGB 11.2* 14.3  HCT 35.4* 45.7  MCV 89.4 90.5  PLT 246 671   Basic Metabolic Panel: Recent Labs  Lab 07/22/21 0434 07/27/21 1355  NA 137 138  K 3.7 3.9  CL 104 91*  CO2 29 37*  GLUCOSE 78 113*  BUN <5* 6*  CREATININE 0.45 0.65  CALCIUM 8.2* 9.1  MG 1.7  --   PHOS 2.5  --    GFR: Estimated Creatinine Clearance: 51 mL/min (by C-G formula based on SCr of 0.65 mg/dL).  Liver Function Tests: Recent Labs  Lab 07/22/21 0434 07/27/21 1355  AST 12* 23  ALT 9 10  ALKPHOS 45 60  BILITOT 0.6 0.9  PROT 5.1* 7.6  ALBUMIN 2.0* 3.0*   Urine analysis:    Component Value Date/Time   COLORURINE YELLOW (A) 07/27/2021 1741   APPEARANCEUR CLEAR (A)  07/27/2021 1741   APPEARANCEUR Hazy (A) 12/08/2015 1102   LABSPEC >1.046 (H) 07/27/2021 1741   PHURINE 8.0 07/27/2021 1741   GLUCOSEU NEGATIVE 07/27/2021 1741   HGBUR NEGATIVE 07/27/2021 1741   BILIRUBINUR NEGATIVE 07/27/2021 1741   BILIRUBINUR Negative 12/08/2015 [place] 07/27/2021 1741   PROTEINUR NEGATIVE 07/27/2021 1741   NITRITE NEGATIVE 07/27/2021 1741   LEUKOCYTESUR NEGATIVE 07/27/2021 1741   Dr. Tobie Poet [facility]  If 7PM-7AM, please contact overnight-coverage provider If 7AM-7PM, please contact day coverage provider [email or web address]  07/27/2021, 6:29 PM

## 2021-07-27 NOTE — ED Provider Triage Note (Signed)
Emergency Medicine Provider Triage Evaluation Note  Brianna Briggs , a 71 y.o. female  was evaluated in triage.  Pt complains of left lower quadrant pain at site of drain with redness and swelling, states area is more tender than when she left the hospital.  Having a lot of nausea and unable to keep down the antibiotic.Marland Kitchen  Review of Systems  Positive: See above Negative: Chest pain shortness of breath  Physical Exam  There were no vitals taken for this visit. Gen:   Awake, no distress   Resp:  Normal effort  MSK:   Moves extremities without difficulty  Other:  Abdomen has bandage in place with discolored drainage noted, area is very tender to palpation  Medical Decision Making  Medically screening exam initiated at 1:40 PM.  Appropriate orders placed.  Brianna Briggs was informed that the remainder of the evaluation will be completed by another provider, this initial triage assessment does not replace that evaluation, and the importance of remaining in the ED until their evaluation is complete.  Due to concerns of sepsis due to this drained and increased infection we will do a CT abdomen/pelvis with IV contrast.  Patient was given Zofran IV here in the triage area.   Brianna Starks, PA-C 07/27/21 1341

## 2021-07-27 NOTE — ED Triage Notes (Signed)
Patient reports being admitted here at [facility] and discharged Monday for diverticulitis. Sent home with oral antibiotic. Reports she is is nauseated and cannot take the antibiotic but also reports she does not eat. Patient was requesting zofran and MD told her to come to ER. Patient has abdominal drain X6 weeks and reports the last week this site has become more tender and more drainage

## 2021-07-27 NOTE — Consult Note (Signed)
CODE SEPSIS - PHARMACY COMMUNICATION  **Broad Spectrum Antibiotics should be administered within 1 hour of Sepsis diagnosis**  Time Code Sepsis Called/Page Received: 1705  Antibiotics Ordered: 1742  Time of 1st antibiotic administration: ceftriaxone      Oswald Hillock ,PharmD Clinical Pharmacist  07/27/2021  6:03 PM

## 2021-07-27 NOTE — Assessment & Plan Note (Addendum)
-   Patient meets sepsis criteria with increased heart rate, leukocytosis, source of intra-abdominal abscess -S/p treatment with percutaneous drainage and prior IV antibiotic course - Now has undergone sigmoid resection with loop ileostomy creation - Antibiotics have now been discontinued

## 2021-07-27 NOTE — Sepsis Progress Note (Signed)
Notified bedside nurse of need to draw blood cultures.  

## 2021-07-27 NOTE — Hospital Course (Addendum)
Brianna Briggs is a 71 yo female with PMH 71 year old with history of CAD, HLD, solitary kidney, recurrent diverticulitis with abscess who presented with epigastric pain.  She had been on outpatient antibiotics with minimal improvement. CT abdomen/pelvis on admission showed persistent abscess attributed to acute sigmoid diverticulitis. She had initially been treated with percutaneous drainage and IV antibiotics as well. She was followed by general surgery and ultimately underwent sigmoid resection with anastomosis and loop ileostomy creation.  She had persistent lethargy and poor appetite after surgery.  She had learned that her husband was diagnosed with COVID and had also been visiting her in the hospital.  She underwent COVID-19 testing on 08/04/2021 which was also positive.  She was treated with a course of remdesivir and steroids given her worsening oxygen requirement from her baseline as well.    Oxygen requirements now stable on baseline 2 L/min oxygen.  Appetite is still poor but slowly improving since husband has brought in food from outside the hospital.    General surgery has continued to follow.  Ostomy output has been adequate / normal.    8/1: Patient is stable for discharge to SNF for short-term rehab.

## 2021-07-27 NOTE — ED Provider Notes (Signed)
[facility] Provider Note    Event Date/Time   First MD Initiated Contact with Patient 07/27/21 1539     (approximate)   History   Diverticulitis   HPI  Brianna Briggs is a 71 y.o. female  with recent diverticulitis who was discharged 2 days ago after being on IV antibiotics for diverticulitis. When she went off she was on cefdinir and flagyl and can not tolerate it with nausea and vomiting.  She denies having any zofran at home to try to take. She has a drain that was placed 6 weeks ago for  diverticulitis and now she is having some increased pain and discomfort out of the around the tube 36 hours ago.    Physical Exam   Triage Vital Signs: ED Triage Vitals [07/27/21 1338]  Enc Vitals Group     BP 104/72     Pulse Rate (!) 105     Resp 18     Temp 98.3 F (36.8 C)     Temp Source Oral     SpO2 94 %     Weight      Height      Head Circumference      Peak Flow      Pain Score      Pain Loc      Pain Edu?      Excl. in [place]?     Most recent vital signs: Vitals:   07/27/21 1338  BP: 104/72  Pulse: (!) 105  Resp: 18  Temp: 98.3 F (36.8 C)  SpO2: 94%     General: Awake, no distress.  CV:  Good peripheral perfusion.  Tachy Resp:  Normal effort.  Abd:  No distention. Drain in place with some drainage noted out of the side of the tube.  Other:     ED Results / Procedures / Treatments   Labs (all labs ordered are listed, but only abnormal results are displayed) Labs Reviewed  COMPREHENSIVE METABOLIC PANEL - Abnormal; Notable for the following components:      Result Value   Chloride 91 (*)    CO2 37 (*)    Glucose, Bld 113 (*)    BUN 6 (*)    Albumin 3.0 (*)    All other components within normal limits  CBC WITH DIFFERENTIAL/PLATELET - Abnormal; Notable for the following components:   WBC 13.7 (*)    Neutro Abs 11.1 (*)    All other components within normal limits  LACTIC ACID, PLASMA  LACTIC ACID, PLASMA  URINALYSIS,  ROUTINE W REFLEX MICROSCOPIC     RADIOLOGY I have reviewed the CT personally and interpreted and patient has a drain in place  IMPRESSION: Persistent abscesses related to acute sigmoid diverticulitis, measuring 2.7 x 0.8 cm along the pigtail of the percutaneous drain which is slightly increased, and a slightly decreased anterolateral collection measuring 3.0 x 1.6 cm which previously measured 4.1 x 2.2 cm. The anterolateral collection abuts a loop of small bowel with mild wall thickening inflammatory change. There is also adjacent focal wall thickening of the bladder dome which is likely reactive.    PROCEDURES:  Critical Care performed: Yes, see critical care procedure note(s)  .Critical Care  Performed by: Vanessa Gowrie, MD Authorized by: Vanessa [place], MD   Critical care provider statement:    Critical care time (minutes):  30   Critical care was necessary to treat or prevent imminent or life-threatening deterioration of the following conditions:  Sepsis   Critical care was time spent personally by me on the following activities:  Development of treatment plan with patient or surrogate, discussions with consultants, evaluation of patient's response to treatment, examination of patient, ordering and review of laboratory studies, ordering and review of radiographic studies, ordering and performing treatments and interventions, pulse oximetry, re-evaluation of patient's condition and review of old [place] ED: Medications  cefTRIAXone (ROCEPHIN) 2 g in sodium chloride 0.9 % 100 mL IVPB (has no administration in time range)  metroNIDAZOLE (FLAGYL) IVPB 500 mg (has no administration in time range)  sodium chloride 0.9 % bolus 1,000 mL (has no administration in time range)  ondansetron (ZOFRAN) injection 4 mg (4 mg Intravenous Given 07/27/21 1624)  iohexol (OMNIPAQUE) 300 MG/ML solution 100 mL (100 mLs Intravenous Contrast Given 07/27/21 1441)  sodium  chloride 0.9 % bolus 500 mL (500 mLs Intravenous New Bag/Given 07/27/21 1636)     IMPRESSION / MDM / [place] / ED COURSE  I reviewed the triage vital signs and the nursing notes.   Patient's presentation is most consistent with acute presentation with potential threat to life or bodily function.    Differential includes worsening abscess, diverticulitis, perforation.  Repeat CT scan ordered from triage.   CBC white count elevated Lactate nromal CMP low Cl    Patient meets sepsis criteria with a source of diverticulitis therefore blood cultures were ordered.  Lactate already came back normal.  Patient getting additional liter of fluid.  Patient started on ceftriaxone and Flagyl.  Discussed going home with Zofran, Bentyl versus admission and given the worsening white count in the setting of drainage now coming out of where her tube is coming through I think patient needs to be admitted to the hospital for them to look at this area and to make sure the tube does not need to be replaced as well as help prevent worsening of her illness.   FINAL CLINICAL IMPRESSION(S) / ED DIAGNOSES   Final diagnoses:  Colonic diverticular abscess     Rx / DC Orders   ED Discharge Orders     None        Note:  This document was prepared using Dragon voice recognition software and may include unintentional dictation errors.   Vanessa , MD 07/27/21 [id number]

## 2021-07-28 DIAGNOSIS — Z6820 Body mass index (BMI) 20.0-20.9, adult: Secondary | ICD-10-CM | POA: Diagnosis not present

## 2021-07-28 DIAGNOSIS — I739 Peripheral vascular disease, unspecified: Secondary | ICD-10-CM | POA: Diagnosis present

## 2021-07-28 DIAGNOSIS — I10 Essential (primary) hypertension: Secondary | ICD-10-CM | POA: Diagnosis present

## 2021-07-28 DIAGNOSIS — K5780 Diverticulitis of intestine, part unspecified, with perforation and abscess without bleeding: Secondary | ICD-10-CM | POA: Diagnosis not present

## 2021-07-28 DIAGNOSIS — Z880 Allergy status to penicillin: Secondary | ICD-10-CM | POA: Diagnosis not present

## 2021-07-28 DIAGNOSIS — F329 Major depressive disorder, single episode, unspecified: Secondary | ICD-10-CM | POA: Diagnosis present

## 2021-07-28 DIAGNOSIS — J449 Chronic obstructive pulmonary disease, unspecified: Secondary | ICD-10-CM | POA: Diagnosis not present

## 2021-07-28 DIAGNOSIS — I428 Other cardiomyopathies: Secondary | ICD-10-CM | POA: Diagnosis not present

## 2021-07-28 DIAGNOSIS — J440 Chronic obstructive pulmonary disease with acute lower respiratory infection: Secondary | ICD-10-CM | POA: Diagnosis not present

## 2021-07-28 DIAGNOSIS — K651 Peritoneal abscess: Secondary | ICD-10-CM | POA: Diagnosis present

## 2021-07-28 DIAGNOSIS — E876 Hypokalemia: Secondary | ICD-10-CM | POA: Diagnosis not present

## 2021-07-28 DIAGNOSIS — J9621 Acute and chronic respiratory failure with hypoxia: Secondary | ICD-10-CM | POA: Diagnosis not present

## 2021-07-28 DIAGNOSIS — E440 Moderate protein-calorie malnutrition: Secondary | ICD-10-CM

## 2021-07-28 DIAGNOSIS — Z881 Allergy status to other antibiotic agents status: Secondary | ICD-10-CM | POA: Diagnosis not present

## 2021-07-28 DIAGNOSIS — J9811 Atelectasis: Secondary | ICD-10-CM | POA: Diagnosis not present

## 2021-07-28 DIAGNOSIS — E782 Mixed hyperlipidemia: Secondary | ICD-10-CM | POA: Diagnosis present

## 2021-07-28 DIAGNOSIS — Z853 Personal history of malignant neoplasm of breast: Secondary | ICD-10-CM | POA: Diagnosis not present

## 2021-07-28 DIAGNOSIS — U071 COVID-19: Secondary | ICD-10-CM | POA: Diagnosis not present

## 2021-07-28 DIAGNOSIS — J1282 Pneumonia due to coronavirus disease 2019: Secondary | ICD-10-CM | POA: Diagnosis not present

## 2021-07-28 DIAGNOSIS — F17210 Nicotine dependence, cigarettes, uncomplicated: Secondary | ICD-10-CM | POA: Diagnosis present

## 2021-07-28 DIAGNOSIS — A419 Sepsis, unspecified organism: Secondary | ICD-10-CM | POA: Diagnosis present

## 2021-07-28 DIAGNOSIS — I2510 Atherosclerotic heart disease of native coronary artery without angina pectoris: Secondary | ICD-10-CM | POA: Diagnosis present

## 2021-07-28 DIAGNOSIS — K5792 Diverticulitis of intestine, part unspecified, without perforation or abscess without bleeding: Secondary | ICD-10-CM | POA: Diagnosis present

## 2021-07-28 DIAGNOSIS — R1012 Left upper quadrant pain: Secondary | ICD-10-CM | POA: Diagnosis not present

## 2021-07-28 DIAGNOSIS — M545 Low back pain, unspecified: Secondary | ICD-10-CM | POA: Diagnosis present

## 2021-07-28 DIAGNOSIS — K5720 Diverticulitis of large intestine with perforation and abscess without bleeding: Secondary | ICD-10-CM | POA: Diagnosis present

## 2021-07-28 DIAGNOSIS — Z885 Allergy status to narcotic agent status: Secondary | ICD-10-CM | POA: Diagnosis not present

## 2021-07-28 DIAGNOSIS — K650 Generalized (acute) peritonitis: Secondary | ICD-10-CM | POA: Diagnosis present

## 2021-07-28 LAB — CORTISOL-AM, BLOOD: Cortisol - AM: 10.9 ug/dL (ref 6.7–22.6)

## 2021-07-28 LAB — BASIC METABOLIC PANEL
Anion gap: 7 (ref 5–15)
BUN: <5 mg/dL — ABNORMAL LOW (ref 8–23)
CO2: 33 mmol/L — ABNORMAL HIGH (ref 22–32)
Calcium: 8.3 mg/dL — ABNORMAL LOW (ref 8.9–10.3)
Chloride: 99 mmol/L (ref 98–111)
Creatinine, Ser: 0.63 mg/dL (ref 0.44–1.00)
GFR, Estimated: >60 mL/min (ref >60)
Glucose, Bld: 88 mg/dL (ref 70–99)
Potassium: 3.3 mmol/L — ABNORMAL LOW (ref 3.5–5.1)
Sodium: 139 mmol/L (ref 135–145)

## 2021-07-28 LAB — CBC
HCT: 36.5 % (ref 36.0–46.0)
Hemoglobin: 11.6 g/dL — ABNORMAL LOW (ref 12.0–15.0)
MCH: 28.4 pg (ref 26.0–34.0)
MCHC: 31.8 g/dL (ref 30.0–36.0)
MCV: 89.5 fL (ref 80.0–100.0)
Platelets: 197 10*3/uL (ref 150–400)
RBC: 4.08 MIL/uL (ref 3.87–5.11)
RDW: 14.1 % (ref 11.5–15.5)
WBC: 6.7 10*3/uL (ref 4.0–10.5)
nRBC: 0 % (ref 0.0–0.2)

## 2021-07-28 MED ORDER — PEG 3350-KCL-NA BICARB-NACL 420 G PO SOLR: 2000.0000 mL | Freq: Once | ORAL | Status: DC

## 2021-07-28 MED ORDER — POTASSIUM CHLORIDE 20 MEQ PO PACK: 20.0000 meq | PACK | Freq: Two times a day (BID) | ORAL | Status: DC

## 2021-07-28 MED ORDER — METHYLPREDNISOLONE SODIUM SUCC 125 MG IJ SOLR
125.0000 mg | Freq: Once | INTRAMUSCULAR | Status: AC
Start: 2021-07-28 — End: 2021-07-28
  Administered 2021-07-28: 125 mg via INTRAVENOUS
  Filled 2021-07-28: qty 2

## 2021-07-28 MED ORDER — PEG 3350-KCL-NA BICARB-NACL 420 G PO SOLR
2000.0000 mL | Freq: Once | ORAL | Status: AC
  Administered 2021-07-28: 2000 mL via ORAL
  Filled 2021-07-28: qty 4000

## 2021-07-28 MED ORDER — PREDNISONE 10 MG PO TABS
5.0000 mg | ORAL_TABLET | Freq: Every day | ORAL | Status: DC
  Administered 2021-07-29: 5 mg via ORAL
  Filled 2021-07-28: qty 1

## 2021-07-28 MED ORDER — PEG 3350-KCL-NA BICARB-NACL 420 G PO SOLR
4000.0000 mL | Freq: Once | ORAL | Status: DC
Start: 2021-07-28 — End: 2021-07-28

## 2021-07-28 MED ORDER — SODIUM CHLORIDE 0.9 % IV BOLUS
250.0000 mL | Freq: Once | INTRAVENOUS | Status: AC
  Administered 2021-07-28: 250 mL via INTRAVENOUS

## 2021-07-28 MED ORDER — POTASSIUM CHLORIDE CRYS ER 20 MEQ PO TBCR
40.0000 meq | EXTENDED_RELEASE_TABLET | Freq: Two times a day (BID) | ORAL | Status: DC
  Administered 2021-07-28: 40 meq via ORAL
  Filled 2021-07-28: qty 2

## 2021-07-28 MED ORDER — ORAL CARE MOUTH RINSE: 15.0000 mL | OROMUCOSAL | Status: DC | PRN

## 2021-07-28 NOTE — Progress Notes (Signed)
Patients BP has been running soft all day. Made MD aware, he said that patient had been running low since she was admitted.

## 2021-07-28 NOTE — Progress Notes (Signed)
PROGRESS NOTE    Brianna Briggs  NOB:[id number] DOB: [birth year] DOA: 07/27/2021 PCP: Kirk Ruths, MD    Brief Narrative:  71 year old with history of coronary artery disease, dyslipidemia, solitary kidney and recurrent diverticulitis and diverticular abscess, recently admitted multiple times with lower abdominal pain and attempted to be treated with oral antibiotics and discharged 2 days ago came back to the hospital with persistent pain and epigastric discomfort after taking prescribed oral medications.  She was found to have persistent and increasing abscess on the left lower quadrant and intolerance to oral antibiotics.  Previously treated with IV antibiotics.  Admitted with surgical consultation for possible Hartmann procedure and colostomy.    Assessment & Plan:   Perforated diverticulitis with multiple abscess, acute recurrent diverticular abscess with abdominal pain: Failed conservative therapy. Given patient's failed conservative therapy and recurrent hospitalization and debility, malnutrition patient will benefit with surgical correction now. Continue Rocephin and Flagyl.  Adequate pain medications. Followed by surgery and discussing about surgical options.  COPD: Stable.  On nocturnal oxygen.   Patient is on long-term prednisone therapy, was on prednisone 5 mg daily until discharge 7/10.  Blood pressures low normal.  Not taking prednisone as it was discontinued on discharge. Solu-Medrol 125 mg once now, resume prednisone 5 mg starting tomorrow.  Hyperlipidemia: On hold.  Hypokalemia: Replace aggressively.  We will recheck phosphorus and magnesium tomorrow morning.   DVT prophylaxis: heparin injection 5,000 Units Start: 07/28/21 0600 Place TED hose Start: 07/27/21 1714   Code Status: Full code Family Communication: None Disposition Plan: Status is: Inpatient Remains inpatient appropriate because: Inpatient procedures planned.  Failed outpatient therapy.      Consultants:  General surgery   Procedures:  None  Antimicrobials:  Rocephin and Flagyl 7/12---    Subjective:  Patient seen and examined.  Anxious.  Continues lower abdominal pain but moderate in intensity.  Afebrile.  Denies any nausea vomiting but no appetite.  Small amount of formed stool last night. Patient is not convinced that after somas attempts to do conservative management, she is not able to get better and rather go for surgery.  Objective: Vitals:   07/28/21 0550 07/28/21 0727 07/28/21 1222 07/28/21 1248  BP: 101/72 93/61 (!) 93/48 (!) 84/52  Pulse: 80 84 99 85  Resp: '16 18 17   '$ Temp: 98.9 F (37.2 C) 98.6 F (37 C) 99 F (37.2 C)   TempSrc:      SpO2: 97% 97% 90%   Weight:      Height:        Intake/Output Summary (Last 24 hours) at 07/28/2021 1349 Last data filed at 07/28/2021 1028 Gross per 24 hour  Intake 2783.14 ml  Output 10 ml  Net 2773.14 ml   Filed Weights   07/27/21 2227  Weight: 58.3 kg    Examination:  General exam: Frail and debilitated.  Anxious. Respiratory system: No added sounds.  On 2 L oxygen. Cardiovascular system: S1 & S2 heard, RRR.  Gastrointestinal system: Soft.  Mildly tender left lower quadrant. Percutaneous drain with thin purulent fluid.  Minimal drainage around the tube. Central nervous system: Alert and oriented. No focal neurological deficits. Extremities: Symmetric 5 x 5 power. Skin: No rashes, lesions or ulcers   Data Reviewed: I have personally reviewed following labs and imaging studies  CBC: Recent Labs  Lab 07/22/21 0434 07/27/21 1355 07/28/21 0542  WBC 9.5 13.7* 6.7  NEUTROABS 7.3 11.1*  --   HGB 11.2* 14.3 11.6*  HCT 35.4* 45.7  36.5  MCV 89.4 90.5 89.5  PLT 246 267 563   Basic Metabolic Panel: Recent Labs  Lab 07/22/21 0434 07/27/21 1355 07/28/21 0542  NA 137 138 139  K 3.7 3.9 3.3*  CL 104 91* 99  CO2 29 37* 33*  GLUCOSE 78 113* 88  BUN <5* 6* <5*  CREATININE 0.45 0.65 0.63   CALCIUM 8.2* 9.1 8.3*  MG 1.7  --   --   PHOS 2.5  --   --    GFR: Estimated Creatinine Clearance: 51 mL/min (by C-G formula based on SCr of 0.63 mg/dL). Liver Function Tests: Recent Labs  Lab 07/22/21 0434 07/27/21 1355  AST 12* 23  ALT 9 10  ALKPHOS 45 60  BILITOT 0.6 0.9  PROT 5.1* 7.6  ALBUMIN 2.0* 3.0*   No results for input(s): "LIPASE", "AMYLASE" in the last 168 hours.  No results for input(s): "AMMONIA" in the last 168 hours. Coagulation Profile: No results for input(s): "INR", "PROTIME" in the last 168 hours.  Cardiac Enzymes: No results for input(s): "CKTOTAL", "CKMB", "CKMBINDEX", "TROPONINI" in the last 168 hours. BNP (last 3 results) No results for input(s): "PROBNP" in the last 8760 hours. HbA1C: No results for input(s): "HGBA1C" in the last 72 hours. CBG: No results for input(s): "GLUCAP" in the last 168 hours. Lipid Profile: No results for input(s): "CHOL", "HDL", "LDLCALC", "TRIG", "CHOLHDL", "LDLDIRECT" in the last 72 hours. Thyroid Function Tests: No results for input(s): "TSH", "T4TOTAL", "FREET4", "T3FREE", "THYROIDAB" in the last 72 hours. Anemia Panel: No results for input(s): "VITAMINB12", "FOLATE", "FERRITIN", "TIBC", "IRON", "RETICCTPCT" in the last 72 hours. Sepsis Labs: Recent Labs  Lab 07/27/21 1355 07/27/21 1642 07/27/21 2235  PROCALCITON  --   --  <0.10  LATICACIDVEN 1.6 0.8  --     Recent Results (from the past 240 hour(s))  Blood culture (routine x 2)     Status: None   Collection Time: 07/19/21  4:46 AM   Specimen: BLOOD  Result Value Ref Range Status   Specimen Description BLOOD LEFT FA  Final   Special Requests   Final    BOTTLES DRAWN AEROBIC AND ANAEROBIC Blood Culture adequate volume   Culture   Final    NO GROWTH 5 DAYS Performed at [facility], [address], [place], [place] [address]    Report Status 07/24/2021 FINAL  Final  Blood culture (routine x 2)     Status: None   Collection Time: 07/19/21   7:18 AM   Specimen: BLOOD  Result Value Ref Range Status   Specimen Description BLOOD BLOOD LEFT HAND  Final   Special Requests   Final    BOTTLES DRAWN AEROBIC AND ANAEROBIC Blood Culture adequate volume   Culture   Final    NO GROWTH 5 DAYS Performed at [facility], [address], [place], [place] [address]    Report Status 07/24/2021 FINAL  Final  Culture, blood (single)     Status: None (Preliminary result)   Collection Time: 07/27/21  5:41 PM   Specimen: BLOOD  Result Value Ref Range Status   Specimen Description BLOOD BLOOD LEFT HAND  Final   Special Requests   Final    BOTTLES DRAWN AEROBIC AND ANAEROBIC Blood Culture results may not be optimal due to an inadequate volume of blood received in culture bottles   Culture   Final    NO GROWTH < 12 HOURS Performed at [facility], [address] [place], [place] [address]  Report Status PENDING  Incomplete  Blood culture (routine x 2)     Status: None (Preliminary result)   Collection Time: 07/27/21  7:24 PM   Specimen: BLOOD  Result Value Ref Range Status   Specimen Description BLOOD BLOOD LEFT FOREARM  Final   Special Requests   Final    BOTTLES DRAWN AEROBIC AND ANAEROBIC Blood Culture adequate volume   Culture   Final    NO GROWTH < 12 HOURS Performed at [facility], [address], [place], [place] [address]    Report Status PENDING  Incomplete  Blood culture (routine x 2)     Status: None (Preliminary result)   Collection Time: 07/27/21 10:35 PM   Specimen: BLOOD  Result Value Ref Range Status   Specimen Description BLOOD LEFT WRIST  Final   Special Requests   Final    BOTTLES DRAWN AEROBIC AND ANAEROBIC Blood Culture results may not be optimal due to an excessive volume of blood received in culture bottles   Culture   Final    NO GROWTH < 12 HOURS Performed at [facility], [address], [place], [place] [address]    Report Status PENDING  Incomplete          Radiology Studies: CT ABDOMEN PELVIS W CONTRAST  Result Date: 07/27/2021 CLINICAL DATA:  Peritonitis or perforation suspected diverticulitis with drain in place, more redness and pain at site of drain with discharge EXAM: CT ABDOMEN AND PELVIS WITH CONTRAST TECHNIQUE: Multidetector CT imaging of the abdomen and pelvis was performed using the standard protocol following bolus administration of intravenous contrast. RADIATION DOSE REDUCTION: This exam was performed according to the departmental dose-optimization program which includes automated exposure control, adjustment of the mA and/or kV according to patient size and/or use of iterative reconstruction technique. CONTRAST:  167m OMNIPAQUE IOHEXOL 300 MG/ML  SOLN COMPARISON:  CT abdomen pelvis 07/19/2021 FINDINGS: Lower chest: No acute abnormality. Hepatobiliary: Unchanged hypodense area in the left hepatic lobe adjacent to the falciform, consistent with focal fat, as seen on recent MRI 02/03/2019. small amount of layering sludge or tiny stones in the gallbladder fundus. Pancreas: Unremarkable. No pancreatic ductal dilatation or surrounding inflammatory changes. Spleen: Normal in size without focal abnormality. 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 Unchanged severely atrophied right kidney with cystic changes. Unchanged left renal scarring, cortical thinning, and small cysts. No follow-up imaging is recommended. No left renal hydroureteronephrosis. There is mild bladder wall thickening adjacent stranding, reactive to adjacent diverticulitis. Stomach/Bowel: The stomach is within normal limits. There is no evidence of bowel obstruction.The appendix is normal. There is persistent wall thickening and adjacent stranding along the sigmoid colon. There is a left-sided percutaneous drain in place, with small abscess at the pigtail measuring 2.7 x 0.8 cm, which previously measured 1.9 x 0.9 cm.  Anterolaterally, there is another abscess which possibly communicates measuring up to 3.0 x 1.6 cm (series 6, image 79), which previously measured up to 4.1 x 2.2 cm. This abuts a loop of small bowel (series 2, image 53). Vascular/Lymphatic: Aortoiliac atherosclerosis. No AAA. No lymphadenopathy. Reproductive: Unremarkable. Other: Tiny fat containing umbilical hernia. Small fat containing inguinal hernias. No bowel containing hernia. Musculoskeletal: No acute osseous abnormality. No suspicious osseous lesion. Multilevel degenerative changes of the spine with grade 1 anterolisthesis at L4-L5. IMPRESSION: Persistent abscesses related to acute sigmoid diverticulitis, measuring 2.7 x 0.8 cm along the pigtail of the percutaneous drain which is slightly  increased, and a slightly decreased anterolateral collection measuring 3.0 x 1.6 cm which previously measured 4.1 x 2.2 cm. The anterolateral collection abuts a loop of small bowel with mild wall thickening inflammatory change. There is also adjacent focal wall thickening of the bladder dome which is likely reactive. Electronically Signed   By: Maurine Simmering M.D.   On: 07/27/2021 15:14        Scheduled Meds:  acidophilus  1 capsule Oral TID   heparin  5,000 Units Subcutaneous Q8H   methylPREDNISolone (SOLU-MEDROL) injection  125 mg Intravenous Once   polyethylene glycol-electrolytes  2,000 mL Oral Once   Followed by   [START ON 07/29/2021] polyethylene glycol-electrolytes  2,000 mL Oral Once   potassium chloride  40 mEq Oral BID   [START ON 07/29/2021] predniSONE  5 mg Oral Q breakfast   Continuous Infusions:  cefTRIAXone (ROCEPHIN)  IV     lactated ringers 125 mL/hr at 07/28/21 0544   metronidazole 500 mg (07/28/21 0545)      LOS: 0 days    Time spent: 35 minutes    Barb Merino, MD [facility] Pager [phone]

## 2021-07-28 NOTE — Progress Notes (Signed)
Initial Nutrition Assessment  DOCUMENTATION CODES:   Non-severe (moderate) malnutrition in context of chronic illness  INTERVENTION:  Once diet advances: Recommend dysphagia 3 diet for ease of chewing Carnation Instant Breakfast po TID - each supplement provides 140 kcal and 5g of protein  MVI with minerals daily If unable to advance diet, recommend alternate nutrition access to meet pt's nutritional needs  NUTRITION DIAGNOSIS:   Moderate Malnutrition related to chronic illness, catabolic illness (tobacco use, COPD, SVT, diverticulitis) as evidenced by moderate fat depletion, moderate muscle depletion, energy intake < 75% for > or equal to 1 month.  GOAL:   Patient will meet greater than or equal to 90% of their needs  MONITOR:   Diet advancement, Labs, Weight trends, I & O's  REASON FOR ASSESSMENT:   Malnutrition Screening Tool    ASSESSMENT:   Pt admitted with nausea and percutaneous drainage. PMH significant tobacco use, paroxysmal SVT, CAD of the native heart, h/o diverticulitis of the colon s/p percutaneous drainage in May 2023.  Surgery following. Noted plans for possible surgical intervention with Hartman's procedure.   Pt sitting up in bed, dry heaving at time of visit. Made RN aware and provided emesis bag.   She reports having very minimal intake of food and beverages within the last 2 months d/t nausea and vomiting. She is missing some back teeth so it take a lot of effort for her to chew her foods and she feels as though her mouth is dry by the time she has chewed her food well enough and requires sips of fluid with bites. This also affects the amount of food she intakes.   She appreciates recommendation of dysphagia 3, easy to chew foods, once diet advances. She does not like boost breeze, or ensure/boost plus. Will drink carnation instant breakfast.   Endorses wt loss but did not quantify how much. Reviewed wt history. Pt noted to have had a 4.1% wt loss within  the last 3 months which is not clinically significant for time frame however is concerning given poor PO intake.   Given pt with malnutrition and unable to tolerate adequate PO intake, would recommend alternate nutrition access if unable to advance diet after surgical intervention.   Medications: acidophilus TID IV drips: abx, LR @ 125 ml/hr, flagyl  Labs: potassium 3.3 (L-repleting), BUN <5  I/O's: +2753m since admission  NUTRITION - FOCUSED PHYSICAL EXAM:  Flowsheet Row Most Recent Value  Orbital Region Moderate depletion  Upper Arm Region Mild depletion  Thoracic and Lumbar Region No depletion  Buccal Region Moderate depletion  Temple Region Moderate depletion  Clavicle Bone Region Moderate depletion  Clavicle and Acromion Bone Region Mild depletion  Scapular Bone Region No depletion  Dorsal Hand Moderate depletion  Patellar Region Unable to assess  Anterior Thigh Region Unable to assess  Posterior Calf Region Unable to assess  Edema (RD Assessment) None  Hair Reviewed  Eyes Reviewed  Mouth Reviewed  Skin Reviewed  Nails Reviewed      Diet Order:   Diet Order             Diet clear liquid Room service appropriate? Yes; Fluid consistency: Thin  Diet effective now                  EDUCATION NEEDS:   Education needs have been addressed  Skin:  Skin Assessment: Reviewed RN Assessment  Last BM:  7/12  Height:   Ht Readings from Last 1 Encounters:  07/27/21 '5\' 2"'$  (  1.575 m)   Weight:   Wt Readings from Last 1 Encounters:  07/27/21 58.3 kg   BMI:  Body mass index is 23.51 kg/m.  Estimated Nutritional Needs:   Kcal:  1700-1900  Protein:  80-95g  Fluid:  >/=1.7L  Clayborne Dana, RDN, LDN Clinical Nutrition

## 2021-07-28 NOTE — Progress Notes (Signed)
Patient ID: Brianna Briggs, female   DOB: February 20, 1950, 71 y.o.   MRN: [id number]     [place] Hospital Day(s): 0.   Interval History: Patient seen and examined, no acute events or new complaints overnight. Patient reports starting to feel a little bit better.  Patient endorses that the burning is improving.  She has not had any stabbing pain in the left lower quadrant either.  She denies any fever or chills.  Vital signs in last 24 hours: [min-max] current  Temp:  [98.1 F (36.7 C)-98.9 F (37.2 C)] 98.6 F (37 C) (07/13 0727) Pulse Rate:  [72-105] 84 (07/13 0727) Resp:  [16-18] 18 (07/13 0727) BP: (93-119)/(61-82) 93/61 (07/13 0727) SpO2:  [92 %-99 %] 97 % (07/13 0727) Weight:  [58.3 kg] 58.3 kg (07/12 2227)     Height: '5\' 2"'$  (157.5 cm) Weight: 58.3 kg BMI (Calculated): 23.5   Physical Exam:  Constitutional: alert, cooperative and no distress  Respiratory: breathing non-labored at rest  Cardiovascular: regular rate and sinus rhythm  Gastrointestinal: soft, non-tender, and non-distended  Labs:     Latest Ref Rng & Units 07/28/2021    5:42 AM 07/27/2021    1:55 PM 07/22/2021    4:34 AM  CBC  WBC 4.0 - 10.5 K/uL 6.7  13.7  9.5   Hemoglobin 12.0 - 15.0 g/dL 11.6  14.3  11.2   Hematocrit 36.0 - 46.0 % 36.5  45.7  35.4   Platelets 150 - 400 K/uL 197  267  246       Latest Ref Rng & Units 07/28/2021    5:42 AM 07/27/2021    1:55 PM 07/22/2021    4:34 AM  CMP  Glucose 70 - 99 mg/dL 88  113  78   BUN 8 - 23 mg/dL <5  6  <5   Creatinine 0.44 - 1.00 mg/dL 0.63  0.65  0.45   Sodium 135 - 145 mmol/L 139  138  137   Potassium 3.5 - 5.1 mmol/L 3.3  3.9  3.7   Chloride 98 - 111 mmol/L 99  91  104   CO2 22 - 32 mmol/L 33  37  29   Calcium 8.9 - 10.3 mg/dL 8.3  9.1  8.2   Total Protein 6.5 - 8.1 g/dL  7.6  5.1   Total Bilirubin 0.3 - 1.2 mg/dL  0.9  0.6   Alkaline Phos 38 - 126 U/L  60  45   AST 15 - 41 U/L  23  12   ALT 0 - 44 U/L  10  9     Imaging studies: No new  pertinent imaging studies   Assessment/Plan:  71 y.o. female with recurrent diverticulitis, complicated by pertinent comorbidities including coronary artery disease, peripheral artery disease.  Complicated diverticulitis with abscess -S/p percutaneous drainage -She usually improved with IV antibiotic therapy but failed oral antibiotic therapy -Appreciate hospitalist for optimization of medical comorbidities for possible surgical intervention during admission. -We will start with slow bowel prep as tolerated. -Continue with IV antibiotic therapy -Continue with drain in place -Encourage the patient to ambulate -Replace electrolytes as needed  Arnold Long, MD

## 2021-07-28 NOTE — TOC Initial Note (Signed)
Transition of Care [facility]) - Initial/Assessment Note    Patient Details  Name: Brianna Briggs MRN: [id number] Date of Birth: 1950/11/24  Transition of Care [facility]) CM/SW Contact:    Pete Pelt, RN Phone Number: 07/28/2021, 4:15 PM  Clinical Narrative:   Patient lives at home with her husband, who can assist her on discharge.  She states she returned after a recent admission due to inability to take medications and eat.  She is quite concerned about her condition when she returns home and does not want readmission.  Patient has oxygen at home currently and states she has only used it one day, but it is functioning appropriately.  Patient is current with [facility], but she states she was not home long enough to receive services.  TOC to monitor over hospital stay for additional needs.                Expected Discharge Plan: [place] Barriers to Discharge: Continued Medical Work up   Patient Goals and CMS Choice        Expected Discharge Plan and Services Expected Discharge Plan: [place]   Discharge Planning Services: CM Consult   Living arrangements for the past 2 months: Single Family Home                                      Prior Living Arrangements/Services Living arrangements for the past 2 months: Single Family Home Lives with:: Self, Significant Other Patient language and need for interpreter reviewed:: Yes Do you feel safe going back to the place where you live?: Yes      Need for Family Participation in Patient Care: Yes (Comment) Care giver support system in place?: Yes (comment)   Criminal Activity/Legal Involvement Pertinent to Current Situation/Hospitalization: No - Comment as needed  Activities of Daily Living Home Assistive Devices/Equipment: Eyeglasses, Oxygen ADL Screening (condition at time of admission) Patient's cognitive ability adequate to safely complete daily activities?: Yes Is  the patient deaf or have difficulty hearing?: No Does the patient have difficulty seeing, even when wearing glasses/contacts?: No Does the patient have difficulty concentrating, remembering, or making decisions?: No Patient able to express need for assistance with ADLs?: Yes Does the patient have difficulty dressing or bathing?: No Independently performs ADLs?: Yes (appropriate for developmental age) Does the patient have difficulty walking or climbing stairs?: No Weakness of Legs: None Weakness of Arms/Hands: None  Permission Sought/Granted Permission sought to share information with : Case Manager       Permission granted to share info w AGENCY: Homehealth        Emotional Assessment Appearance:: Appears stated age Attitude/Demeanor/Rapport: Engaged, Gracious Affect (typically observed): Appropriate Orientation: : Oriented to Self, Oriented to Place, Oriented to  Time, Oriented to Situation Alcohol / Substance Use: Not Applicable Psych Involvement: No (comment)  Admission diagnosis:  Diverticulitis [K57.92] Colonic diverticular abscess [K57.20] Perforated diverticulum of intestine [K57.80] Patient Active Problem List   Diagnosis Date Noted   Perforated diverticulum of intestine 07/28/2021   Malnutrition of moderate degree 07/28/2021   Diverticulitis 07/27/2021   Abdominal pain 07/27/2021   Acute on chronic respiratory failure with hypoxia (HCC)    Hypotension    Colonic diverticular abscess 07/19/2021   Sepsis ([place]) 07/19/2021   Chronic obstructive pulmonary disease (COPD) ([place]) 07/19/2021   Diverticulitis large intestine 06/06/2021   Diverticulitis of  large intestine with abscess without bleeding 07/17/2019   Chronic eczema 08/30/2015   Health care maintenance 03/01/2015   Simple chronic bronchitis ([place]) 03/01/2015   Shortness of breath 12/16/2014   Hyperlipidemia, mixed 06/04/2014   Hypercholesterolemia 02/26/2014   PVC (premature ventricular contraction)  09/04/2013   Tobacco abuse 09/04/2013   PCP:  Kirk Ruths, MD Pharmacy:   [id number] [place], [id number]: [phone]: [phone]    Social Determinants of Health (SDOH) Interventions    Readmission Risk Interventions    07/28/2021    4:14 PM  Readmission Risk Prevention Plan  Transportation Screening Complete  PCP or Specialist Appt within 5-7 Days Complete  Home Care Screening Complete  Medication Review (RN CM) Complete

## 2021-07-28 NOTE — Progress Notes (Signed)
BP 83/50. Notified cross-cover MD.

## 2021-07-29 ENCOUNTER — Inpatient Hospital Stay: Payer: Medicare Other

## 2021-07-29 DIAGNOSIS — K5720 Diverticulitis of large intestine with perforation and abscess without bleeding: Secondary | ICD-10-CM | POA: Diagnosis not present

## 2021-07-29 DIAGNOSIS — A419 Sepsis, unspecified organism: Secondary | ICD-10-CM | POA: Diagnosis not present

## 2021-07-29 DIAGNOSIS — E440 Moderate protein-calorie malnutrition: Secondary | ICD-10-CM | POA: Diagnosis not present

## 2021-07-29 DIAGNOSIS — K5780 Diverticulitis of intestine, part unspecified, with perforation and abscess without bleeding: Secondary | ICD-10-CM | POA: Diagnosis not present

## 2021-07-29 LAB — BASIC METABOLIC PANEL
Anion gap: 5 (ref 5–15)
BUN: 9 mg/dL (ref 8–23)
CO2: 31 mmol/L (ref 22–32)
Calcium: 8.4 mg/dL — ABNORMAL LOW (ref 8.9–10.3)
Chloride: 102 mmol/L (ref 98–111)
Creatinine, Ser: 0.66 mg/dL (ref 0.44–1.00)
GFR, Estimated: >60 mL/min (ref >60)
Glucose, Bld: 111 mg/dL — ABNORMAL HIGH (ref 70–99)
Potassium: 4.3 mmol/L (ref 3.5–5.1)
Sodium: 138 mmol/L (ref 135–145)

## 2021-07-29 LAB — CBC WITH DIFFERENTIAL/PLATELET
Abs Immature Granulocytes: 0.01 10*3/uL (ref 0.00–0.07)
Basophils Absolute: 0 10*3/uL (ref 0.0–0.1)
Basophils Relative: 0 %
Eosinophils Absolute: 0 10*3/uL (ref 0.0–0.5)
Eosinophils Relative: 0 %
HCT: 35.8 % — ABNORMAL LOW (ref 36.0–46.0)
Hemoglobin: 11.2 g/dL — ABNORMAL LOW (ref 12.0–15.0)
Immature Granulocytes: 0 %
Lymphocytes Relative: 13 %
Lymphs Abs: 0.4 10*3/uL — ABNORMAL LOW (ref 0.7–4.0)
MCH: 28.1 pg (ref 26.0–34.0)
MCHC: 31.3 g/dL (ref 30.0–36.0)
MCV: 89.7 fL (ref 80.0–100.0)
Monocytes Absolute: 0.2 10*3/uL (ref 0.1–1.0)
Monocytes Relative: 5 %
Neutro Abs: 2.4 10*3/uL (ref 1.7–7.7)
Neutrophils Relative %: 82 %
Platelets: 199 10*3/uL (ref 150–400)
RBC: 3.99 MIL/uL (ref 3.87–5.11)
RDW: 13.8 % (ref 11.5–15.5)
WBC: 2.9 10*3/uL — ABNORMAL LOW (ref 4.0–10.5)
nRBC: 0 % (ref 0.0–0.2)

## 2021-07-29 LAB — PHOSPHORUS: Phosphorus: 3.8 mg/dL (ref 2.5–4.6)

## 2021-07-29 LAB — MAGNESIUM: Magnesium: 1.8 mg/dL (ref 1.7–2.4)

## 2021-07-29 MED ORDER — ALBUTEROL SULFATE (2.5 MG/3ML) 0.083% IN NEBU
3.0000 mL | INHALATION_SOLUTION | Freq: Four times a day (QID) | RESPIRATORY_TRACT | Status: DC
  Administered 2021-07-29 – 2021-07-30 (×5): 3 mL via RESPIRATORY_TRACT
  Filled 2021-07-29 (×5): qty 3

## 2021-07-29 MED ORDER — GUAIFENESIN ER 600 MG PO TB12
600.0000 mg | ORAL_TABLET | Freq: Two times a day (BID) | ORAL | Status: DC
  Administered 2021-07-29 – 2021-08-16 (×36): 600 mg via ORAL
  Filled 2021-07-29 (×36): qty 1

## 2021-07-29 MED ORDER — SODIUM CHLORIDE 0.9 % IV SOLN: INTRAVENOUS | Status: DC

## 2021-07-29 MED ORDER — IOHEXOL 300 MG/ML  SOLN
100.0000 mL | Freq: Once | INTRAMUSCULAR | Status: AC | PRN
  Administered 2021-07-29: 100 mL via INTRAVENOUS

## 2021-07-29 MED ORDER — SODIUM CHLORIDE 0.9 % IV BOLUS
1000.0000 mL | Freq: Once | INTRAVENOUS | Status: AC
  Administered 2021-07-29: 1000 mL via INTRAVENOUS

## 2021-07-29 NOTE — Progress Notes (Signed)
71 y.o. female inpatient. Smoker. History of COPD, SVT. Recently found to have perforated diverticulitis with colonic abscess. IR placed a LLQ (10 Fr) abscess drain on  5.23.23. The drain was malpositioned and was last exchanged and repositioned on 6.14.23. 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 Patient admitted on 7.12.23 to [facility] for abdominal pain, nausea and tenderness around the drain site. CT abd pelvis showed the abscess drain in good position. IR informed on 7.14.23 that retention suture on the abscess drain was broken and the abscess drain itself was pulled back.   Patient seen at bedside with her husband present. The abscess drain appears to be pulled back approximately 1.5 in. Minimal out noted to be around the exit site when flushed. The patient denies any increase in pain with flushing. Case discussed with IR Attending Dr. Serafina Royals CT Abd with contrast ordered for further evaluation.   CT shows abscess drain in good position. Rention reinforced with stat lock X 2.

## 2021-07-29 NOTE — Progress Notes (Signed)
CC: Diverticulitis Subjective: 71 year old female history of COPD oxygen dependent malnutrition presents with recurrent diverticulitis with abscess that failed outpatient medical management.  She does have a drain draining an abscess cavity. She continues to feel weak and miserable overall.  She is very anxious and exhausted from a mental perspective. I did supportive psychotherapy and listened to her and all her concerns She is now leukopenic.  Afebrile with soft blood pressure.  Having bowel movements and is able to tolerate clears.  She complains of persistent left lower quadrant pain  Objective: Vital signs in last 24 hours: Temp:  [97.5 F (36.4 C)-99.6 F (37.6 C)] 97.7 F (36.5 C) (07/14 1146) Pulse Rate:  [57-90] 61 (07/14 1146) Resp:  [17-18] 17 (07/14 1146) BP: (82-94)/(45-78) 88/48 (07/14 1146) SpO2:  [93 %-100 %] 97 % (07/14 1146) Last BM Date : 07/28/21  Intake/Output from previous day: 07/13 0701 - 07/14 0700 In: 1086.3 [I.V.:1001.9; IV Piggyback:84.4] Out: 5 [Drains:5] Intake/Output this shift: Total I/O In: 342.5 [IV Piggyback:342.5] Out: -   Physical exam: She is frail and debilitated, malnourished Abd: Soft tender to palpation left lower quadrant without peritonitis.  Percutaneous drain in place with purulent fluid. Ext: well perfused and warm  Lab Results: CBC  Recent Labs    07/28/21 0542 07/29/21 0425  WBC 6.7 2.9*  HGB 11.6* 11.2*  HCT 36.5 35.8*  PLT 197 199   BMET Recent Labs    07/28/21 0542 07/29/21 0425  NA 139 138  K 3.3* 4.3  CL 99 102  CO2 33* 31  GLUCOSE 88 111*  BUN <5* 9  CREATININE 0.63 0.66  CALCIUM 8.3* 8.4*   PT/INR No results for input(s): "LABPROT", "INR" in the last 72 hours. ABG No results for input(s): "PHART", "HCO3" in the last 72 hours.  Invalid input(s): "PCO2", "PO2"  Studies/Results: CT ABDOMEN PELVIS W CONTRAST  Result Date: 07/27/2021 CLINICAL DATA:  Peritonitis or perforation suspected  diverticulitis with drain in place, more redness and pain at site of drain with discharge EXAM: CT ABDOMEN AND PELVIS WITH CONTRAST TECHNIQUE: Multidetector CT imaging of the abdomen and pelvis was performed using the standard protocol following bolus administration of intravenous contrast. RADIATION DOSE REDUCTION: This exam was performed according to the departmental dose-optimization program which includes automated exposure control, adjustment of the mA and/or kV according to patient size and/or use of iterative reconstruction technique. CONTRAST:  17m OMNIPAQUE IOHEXOL 300 MG/ML  SOLN COMPARISON:  CT abdomen pelvis 07/19/2021 FINDINGS: Lower chest: No acute abnormality. Hepatobiliary: Unchanged hypodense area in the left hepatic lobe adjacent to the falciform, consistent with focal fat, as seen on recent MRI 02/03/2019. small amount of layering sludge or tiny stones in the gallbladder fundus. Pancreas: Unremarkable. No pancreatic ductal dilatation or surrounding inflammatory changes. Spleen: Normal in size without focal abnormality. 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 Unchanged severely atrophied right kidney with cystic changes. Unchanged left renal scarring, cortical thinning, and small cysts. No follow-up imaging is recommended. No left renal hydroureteronephrosis. There is mild bladder wall thickening adjacent stranding, reactive to adjacent diverticulitis. Stomach/Bowel: The stomach is within normal limits. There is no evidence of bowel obstruction.The appendix is normal. There is persistent wall thickening and adjacent stranding along the sigmoid colon. There is a left-sided percutaneous drain in place, with small abscess at the pigtail measuring 2.7 x 0.8 cm, which previously measured 1.9 x 0.9 cm. Anterolaterally, there is another abscess which possibly communicates measuring  up to 3.0 x 1.6 cm (series 6, image 79), which previously  measured up to 4.1 x 2.2 cm. This abuts a loop of small bowel (series 2, image 53). Vascular/Lymphatic: Aortoiliac atherosclerosis. No AAA. No lymphadenopathy. Reproductive: Unremarkable. Other: Tiny fat containing umbilical hernia. Small fat containing inguinal hernias. No bowel containing hernia. Musculoskeletal: No acute osseous abnormality. No suspicious osseous lesion. Multilevel degenerative changes of the spine with grade 1 anterolisthesis at L4-L5. IMPRESSION: Persistent abscesses related to acute sigmoid diverticulitis, measuring 2.7 x 0.8 cm along the pigtail of the percutaneous drain which is slightly increased, and a slightly decreased anterolateral collection measuring 3.0 x 1.6 cm which previously measured 4.1 x 2.2 cm. The anterolateral collection abuts a loop of small bowel with mild wall thickening inflammatory change. There is also adjacent focal wall thickening of the bladder dome which is likely reactive. Electronically Signed   By: Maurine Simmering M.D.   On: 07/27/2021 15:14    Anti-infectives: Anti-infectives (From admission, onward)    Start     Dose/Rate Route Frequency Ordered Stop   07/28/21 1800  cefTRIAXone (ROCEPHIN) 2 g in sodium chloride 0.9 % 100 mL IVPB        2 g 200 mL/hr over 30 Minutes Intravenous Every 24 hours 07/27/21 1717 08/03/21 1759   07/28/21 0600  metroNIDAZOLE (FLAGYL) IVPB 500 mg        500 mg 100 mL/hr over 60 Minutes Intravenous Every 12 hours 07/27/21 1717 08/04/21 0559   07/27/21 1700  cefTRIAXone (ROCEPHIN) 2 g in sodium chloride 0.9 % 100 mL IVPB        2 g 200 mL/hr over 30 Minutes Intravenous  Once 07/27/21 1654 07/27/21 1819   07/27/21 1700  metroNIDAZOLE (FLAGYL) IVPB 500 mg        500 mg 100 mL/hr over 60 Minutes Intravenous  Once 07/27/21 1654 07/27/21 1946       Assessment/Plan: 71 year old female with persistent diverticulitis with abscess.  Clinically not necessarily improving with persistent pain.  She is malnourished, frail, COPD on  oxygen.  I do think that at some point time during this hospitalization may require definitive Hartman's procedure vs colectomy and loop ileostomy.  At this point time she is not peritonitic and does not need emergent surgical intervention.  She is very afraid of the prep and the diarrhea that may cause her as well as the nausea and vomiting.  We will hold off for the prep and may continue clear liquid diet. I spent at least more than 50 minutes in this encounter including coordination of her care, personally reviewing imaging studies, placing orders and performing appropriate documentation  Caroleen Hamman, MD, FACS  07/29/2021

## 2021-07-29 NOTE — Progress Notes (Signed)
PROGRESS NOTE    Brianna Briggs  BTD:[id number] DOB: [birth year] DOA: 07/27/2021 PCP: Kirk Ruths, MD    Brief Narrative:  71 year old with history of coronary artery disease, dyslipidemia, solitary kidney and recurrent diverticulitis and diverticular abscess, recently admitted multiple times with lower abdominal pain and attempted to be treated with oral antibiotics and discharged 2 days ago came back to the hospital with persistent pain and epigastric discomfort after taking prescribed oral medications.  She was found to have persistent and increasing abscess on the left lower quadrant and intolerance to oral antibiotics.  Previously treated with IV antibiotics.  Admitted with surgical consultation for possible Hartmann procedure and colostomy.    Assessment & Plan:   Perforated diverticulitis with multiple abscess, acute recurrent diverticular abscess with abdominal pain: Failed conservative therapy. Given patient's failed conservative therapy and recurrent hospitalization and debility, malnutrition patient will benefit with surgical correction now. Continue Rocephin and Flagyl.  Adequate pain medications. Followed by surgery and discussing about surgical options. On clear liquid diet and laxative.  COPD: Stable.  On nocturnal oxygen.   Review of records indicated patient is not on maintenance prednisone.  Discontinue all steroids. Incentive spirometry, chest physiotherapy, will start patient on albuterol inhaler and Mucinex.  Hyperlipidemia: Statin on hold.  Electrolytes: Replaced.  Adequate.  Chronic hypertension: Asymptomatic.  Monitor.  No indication to increase blood pressures.   DVT prophylaxis: heparin injection 5,000 Units Start: 07/28/21 0600 Place TED hose Start: 07/27/21 1714   Code Status: Full code Family Communication: None Disposition Plan: Status is: Inpatient Remains inpatient appropriate because: Inpatient procedures planned.  Failed  outpatient therapy.     Consultants:  General surgery   Procedures:  None  Antimicrobials:  Rocephin and Flagyl 7/12---    Subjective:  Seen and examined.  Tearful and anxious.  Difficult to take large dose of GoLytely.  Having loose bowel movements.  Left lower quadrant hurts but not unusual for long time now.  Objective: Vitals:   07/28/21 2000 07/29/21 0050 07/29/21 0421 07/29/21 0813  BP: (!) 83/50 (!) 82/48 (!) 94/45 (!) 89/78  Pulse: (!) 59 (!) 57 63 62  Resp: '18 18 18 17  '$ Temp: 98.4 F (36.9 C) 98 F (36.7 C) 98 F (36.7 C) (!) 97.5 F (36.4 C)  TempSrc:   Oral Oral  SpO2: 99% 100% 97% 93%  Weight:      Height:        Intake/Output Summary (Last 24 hours) at 07/29/2021 1113 Last data filed at 07/29/2021 0846 Gross per 24 hour  Intake 1428.76 ml  Output 5 ml  Net 1423.76 ml   Filed Weights   07/27/21 2227  Weight: 58.3 kg    Examination:  General exam: Frail and debilitated.  Anxious. Respiratory system: Upper airway sounds.  On 2 L oxygen. Cardiovascular system: S1 & S2 heard, RRR.  Gastrointestinal system: Soft.  Mildly tender left lower quadrant. Percutaneous drain with thick pus at the bottom.  Minimal drainage around the tube. Central nervous system: Alert and oriented. No focal neurological deficits. Extremities: Symmetric 5 x 5 power. Skin: No rashes, lesions or ulcers   Data Reviewed: I have personally reviewed following labs and imaging studies  CBC: Recent Labs  Lab 07/27/21 1355 07/28/21 0542 07/29/21 0425  WBC 13.7* 6.7 2.9*  NEUTROABS 11.1*  --  2.4  HGB 14.3 11.6* 11.2*  HCT 45.7 36.5 35.8*  MCV 90.5 89.5 89.7  PLT 267 197 106   Basic Metabolic Panel: Recent Labs  Lab 07/27/21 1355 07/28/21 0542 07/29/21 0425  NA 138 139 138  K 3.9 3.3* 4.3  CL 91* 99 102  CO2 37* 33* 31  GLUCOSE 113* 88 111*  BUN 6* <5* 9  CREATININE 0.65 0.63 0.66  CALCIUM 9.1 8.3* 8.4*  MG  --   --  1.8  PHOS  --   --  3.8    GFR: Estimated Creatinine Clearance: 51 mL/min (by C-G formula based on SCr of 0.66 mg/dL). Liver Function Tests: Recent Labs  Lab 07/27/21 1355  AST 23  ALT 10  ALKPHOS 60  BILITOT 0.9  PROT 7.6  ALBUMIN 3.0*   No results for input(s): "LIPASE", "AMYLASE" in the last 168 hours.  No results for input(s): "AMMONIA" in the last 168 hours. Coagulation Profile: No results for input(s): "INR", "PROTIME" in the last 168 hours.  Cardiac Enzymes: No results for input(s): "CKTOTAL", "CKMB", "CKMBINDEX", "TROPONINI" in the last 168 hours. BNP (last 3 results) No results for input(s): "PROBNP" in the last 8760 hours. HbA1C: No results for input(s): "HGBA1C" in the last 72 hours. CBG: No results for input(s): "GLUCAP" in the last 168 hours. Lipid Profile: No results for input(s): "CHOL", "HDL", "LDLCALC", "TRIG", "CHOLHDL", "LDLDIRECT" in the last 72 hours. Thyroid Function Tests: No results for input(s): "TSH", "T4TOTAL", "FREET4", "T3FREE", "THYROIDAB" in the last 72 hours. Anemia Panel: No results for input(s): "VITAMINB12", "FOLATE", "FERRITIN", "TIBC", "IRON", "RETICCTPCT" in the last 72 hours. Sepsis Labs: Recent Labs  Lab 07/27/21 1355 07/27/21 1642 07/27/21 2235  PROCALCITON  --   --  <0.10  LATICACIDVEN 1.6 0.8  --     Recent Results (from the past 240 hour(s))  Culture, blood (single)     Status: None (Preliminary result)   Collection Time: 07/27/21  5:41 PM   Specimen: BLOOD  Result Value Ref Range Status   Specimen Description BLOOD BLOOD LEFT HAND  Final   Special Requests   Final    BOTTLES DRAWN AEROBIC AND ANAEROBIC Blood Culture results may not be optimal due to an inadequate volume of blood received in culture bottles   Culture   Final    NO GROWTH 2 DAYS Performed at [facility], [address], [place], [place] [address]    Report Status PENDING  Incomplete  Blood culture (routine x 2)     Status: None (Preliminary result)   Collection  Time: 07/27/21  7:24 PM   Specimen: BLOOD  Result Value Ref Range Status   Specimen Description BLOOD BLOOD LEFT FOREARM  Final   Special Requests   Final    BOTTLES DRAWN AEROBIC AND ANAEROBIC Blood Culture adequate volume   Culture   Final    NO GROWTH 2 DAYS Performed at [facility], [address], [place], [place] [address]    Report Status PENDING  Incomplete  Blood culture (routine x 2)     Status: None (Preliminary result)   Collection Time: 07/27/21 10:35 PM   Specimen: BLOOD  Result Value Ref Range Status   Specimen Description BLOOD LEFT WRIST  Final   Special Requests   Final    BOTTLES DRAWN AEROBIC AND ANAEROBIC Blood Culture results may not be optimal due to an excessive volume of blood received in culture bottles   Culture   Final    NO GROWTH 2 DAYS Performed at [facility], [address], [place], [place] [address]    Report Status PENDING  Incomplete  Radiology Studies: CT ABDOMEN PELVIS W CONTRAST  Result Date: 07/27/2021 CLINICAL DATA:  Peritonitis or perforation suspected diverticulitis with drain in place, more redness and pain at site of drain with discharge EXAM: CT ABDOMEN AND PELVIS WITH CONTRAST TECHNIQUE: Multidetector CT imaging of the abdomen and pelvis was performed using the standard protocol following bolus administration of intravenous contrast. RADIATION DOSE REDUCTION: This exam was performed according to the departmental dose-optimization program which includes automated exposure control, adjustment of the mA and/or kV according to patient size and/or use of iterative reconstruction technique. CONTRAST:  151m OMNIPAQUE IOHEXOL 300 MG/ML  SOLN COMPARISON:  CT abdomen pelvis 07/19/2021 FINDINGS: Lower chest: No acute abnormality. Hepatobiliary: Unchanged hypodense area in the left hepatic lobe adjacent to the falciform, consistent with focal fat, as seen on recent MRI 02/03/2019. small amount of layering sludge or tiny  stones in the gallbladder fundus. Pancreas: Unremarkable. No pancreatic ductal dilatation or surrounding inflammatory changes. Spleen: Normal in size without focal abnormality. 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 Unchanged severely atrophied right kidney with cystic changes. Unchanged left renal scarring, cortical thinning, and small cysts. No follow-up imaging is recommended. No left renal hydroureteronephrosis. There is mild bladder wall thickening adjacent stranding, reactive to adjacent diverticulitis. Stomach/Bowel: The stomach is within normal limits. There is no evidence of bowel obstruction.The appendix is normal. There is persistent wall thickening and adjacent stranding along the sigmoid colon. There is a left-sided percutaneous drain in place, with small abscess at the pigtail measuring 2.7 x 0.8 cm, which previously measured 1.9 x 0.9 cm. Anterolaterally, there is another abscess which possibly communicates measuring up to 3.0 x 1.6 cm (series 6, image 79), which previously measured up to 4.1 x 2.2 cm. This abuts a loop of small bowel (series 2, image 53). Vascular/Lymphatic: Aortoiliac atherosclerosis. No AAA. No lymphadenopathy. Reproductive: Unremarkable. Other: Tiny fat containing umbilical hernia. Small fat containing inguinal hernias. No bowel containing hernia. Musculoskeletal: No acute osseous abnormality. No suspicious osseous lesion. Multilevel degenerative changes of the spine with grade 1 anterolisthesis at L4-L5. IMPRESSION: Persistent abscesses related to acute sigmoid diverticulitis, measuring 2.7 x 0.8 cm along the pigtail of the percutaneous drain which is slightly increased, and a slightly decreased anterolateral collection measuring 3.0 x 1.6 cm which previously measured 4.1 x 2.2 cm. The anterolateral collection abuts a loop of small bowel with mild wall thickening inflammatory change. There is also adjacent focal wall  thickening of the bladder dome which is likely reactive. Electronically Signed   By: JMaurine SimmeringM.D.   On: 07/27/2021 15:14        Scheduled Meds:  acidophilus  1 capsule Oral TID   albuterol  3 mL Inhalation Q6H   guaiFENesin  600 mg Oral BID   heparin  5,000 Units Subcutaneous Q8H   polyethylene glycol-electrolytes  2,000 mL Oral Once   Continuous Infusions:  cefTRIAXone (ROCEPHIN)  IV Stopped (07/28/21 1753)   metronidazole Stopped (07/29/21 0617)      LOS: 1 day    Time spent: 35 minutes    KBarb Merino MD [facility] Pager [phone]

## 2021-07-30 DIAGNOSIS — K5720 Diverticulitis of large intestine with perforation and abscess without bleeding: Secondary | ICD-10-CM | POA: Diagnosis not present

## 2021-07-30 DIAGNOSIS — M545 Low back pain, unspecified: Secondary | ICD-10-CM

## 2021-07-30 LAB — CBC
HCT: 33 % — ABNORMAL LOW (ref 36.0–46.0)
Hemoglobin: 10.6 g/dL — ABNORMAL LOW (ref 12.0–15.0)
MCH: 28.6 pg (ref 26.0–34.0)
MCHC: 32.1 g/dL (ref 30.0–36.0)
MCV: 89.2 fL (ref 80.0–100.0)
Platelets: 177 10*3/uL (ref 150–400)
RBC: 3.7 MIL/uL — ABNORMAL LOW (ref 3.87–5.11)
RDW: 14.4 % (ref 11.5–15.5)
WBC: 6.4 10*3/uL (ref 4.0–10.5)
nRBC: 0 % (ref 0.0–0.2)

## 2021-07-30 LAB — GLUCOSE, CAPILLARY: Glucose-Capillary: 88 mg/dL (ref 70–99)

## 2021-07-30 MED ORDER — SODIUM CHLORIDE 0.9 % IV BOLUS
500.0000 mL | Freq: Once | INTRAVENOUS | Status: AC
Start: 2021-07-30 — End: 2021-07-30
  Administered 2021-07-30: 500 mL via INTRAVENOUS

## 2021-07-30 MED ORDER — PROMETHAZINE HCL 25 MG PO TABS
12.5000 mg | ORAL_TABLET | Freq: Four times a day (QID) | ORAL | Status: DC | PRN
  Administered 2021-07-30 (×2): 12.5 mg via ORAL
  Filled 2021-07-30 (×3): qty 1

## 2021-07-30 NOTE — Progress Notes (Signed)
Pt c/o pain of 9 in abdomen. BP soft  96/55. Per MD give oral pain medication, hold off on IV pain medication at this time. Give 500 cc bolus. Tylenol administered, bolus initiated. Will continue to monitor.

## 2021-07-30 NOTE — Progress Notes (Signed)
CC: Diverticulitis Subjective: 71 year old female history of COPD oxygen dependent malnutrition presents with recurrent diverticulitis with abscess that failed outpatient medical management.  She does have a drain draining an abscess cavity. Febrile to 101.  WBC up to 6.4 She continues to feel weak and miserable overall.  She is very anxious and exhausted from a mental perspective.  Gave opportunity for she and her husband share their frustrations.  Does not seem to be happy with clears offered. Denies nausea or vomiting. Having bowel movements and is able to tolerate clears.  She complains of persistent left lower quadrant pain, where her passive drain is present  Objective: Vital signs in last 24 hours: Temp:  [97.7 F (36.5 C)-101 F (38.3 C)] 98.7 F (37.1 C) (07/15 0808) Pulse Rate:  [61-88] 82 (07/15 0808) Resp:  [14-18] 14 (07/15 0808) BP: (88-112)/(48-66) 112/66 (07/15 0808) SpO2:  [90 %-100 %] 90 % (07/15 0808) Last BM Date : 07/28/21  Intake/Output from previous day: 07/14 0701 - 07/15 0700 In: 1352.2 [I.V.:10.7; IV Piggyback:1331.4] Out: 20 [Drains:20] Intake/Output this shift: No intake/output data recorded.  Physical exam: She is frail and debilitated, malnourished Abd: Soft tender to palpation left lower quadrant without peritonitis.  Percutaneous drain in place with purulent fluid. Ext: well perfused and warm  Lab Results: CBC  Recent Labs    07/29/21 0425 07/30/21 0715  WBC 2.9* 6.4  HGB 11.2* 10.6*  HCT 35.8* 33.0*  PLT 199 177    BMET Recent Labs    07/28/21 0542 07/29/21 0425  NA 139 138  K 3.3* 4.3  CL 99 102  CO2 33* 31  GLUCOSE 88 111*  BUN <5* 9  CREATININE 0.63 0.66  CALCIUM 8.3* 8.4*      Studies/Results: CT ABDOMEN PELVIS W CONTRAST  Result Date: 07/29/2021 CLINICAL DATA:  Intra-abdominal abscess.  Evaluate drain placement. EXAM: CT ABDOMEN AND PELVIS WITH CONTRAST TECHNIQUE: Multidetector CT imaging of the abdomen and pelvis  was performed using the standard protocol following bolus administration of intravenous contrast. RADIATION DOSE REDUCTION: This exam was performed according to the departmental dose-optimization program which includes automated exposure control, adjustment of the mA and/or kV according to patient size and/or use of iterative reconstruction technique. CONTRAST:  113m OMNIPAQUE IOHEXOL 300 MG/ML  SOLN COMPARISON:  CT of the abdomen and pelvis July 27, 2021 FINDINGS: Lower chest: No acute abnormality. Hepatobiliary: High attenuation material in the gallbladder identified without wall thickening or adjacent fat stranding. The liver is unremarkable. Pancreas: Unremarkable. No pancreatic ductal dilatation or surrounding inflammatory changes. Spleen: Normal in size without focal abnormality. Adrenals/Urinary Tract: The 8 mm left adrenal myelolipoma is stable. No change the adrenal glands. The right kidney remains atrophic with cystic changes. The proximal right ureter remains prominent. The cyst in the medial left kidney is stable. Another cyst located posteriorly on image 34 is stable. No follow-up imaging recommended for the cysts. The left kidney is otherwise normal. The left adrenal gland is unremarkable. The bladder is unremarkable. Stomach/Bowel: The stomach and small bowel are within normal limits. There is persistent wall thickening and stranding along the sigmoid colon. The pigtail drainage catheter is stable, extending along the posterior aspect of the sigmoid colon. The small abscess adjacent to the pigtail catheter measures 2.1 by 0.6 cm today versus 2.7 x 0.8 cm previously. Anteriorly laterally is another abscess which may communicate which measures 3.0 by 1.3 cm on sagittal image 97 today versus 3.0 x 1.6 cm on the same image previously. This  abscess tracks to the adjacent colon as identified on sagittal image 101. This track contained mostly air previously but now contains mostly fluid today. This abscess  also appears to track laterally and anteriorly as identified on sagittal image 102 and axial image 61. This tract appears to contain primarily air. The amount of fat stranding surrounding the affected loop of colon is similar to slightly worsened. The colon is otherwise unremarkable. Visualized appendix is normal. Vascular/Lymphatic: Calcified atherosclerotic changes are seen in the nonaneurysmal aorta. No adenopathy. Reproductive: Uterus and bilateral adnexa are unremarkable. Other: A small amount of fluid is identified posteriorly in the pelvis, not identified previously. This fluid appears to be free and is likely reactive. No free air. Musculoskeletal: No change IMPRESSION: 1. Persistent abscess related to acute sigmoid diverticulitis. A portion of the abscess measuring 2.1 x 0.6 cm today measured 2.7 x 0.8 cm previously. Anteriorly and laterally is another abscess which may be connected to the first measuring 3.0 x 1.3 cm today versus 3.0 x 1.6 cm previously. Two tracks extend off this 3.0 x 1.3 cm abscess today. There is a fluid-filled tract extending to the colon which was air-filled before but unchanged in size. There is an anterior tract which is more conspicuous in the interval but primarily containing air. The amount of fat stranding in the region of the abscesses in colon is stable to mildly worsened. 2. Probable sludge or stones in the gallbladder. 3. There is a small amount of free fluid posteriorly in the pelvis, likely reactive. 4. No other significant interval changes. Electronically Signed   By: Dorise Bullion III M.D.   On: 07/29/2021 16:21   Images from yesterday reviewed personally. Anti-infectives: Anti-infectives (From admission, onward)    Start     Dose/Rate Route Frequency Ordered Stop   07/28/21 1800  cefTRIAXone (ROCEPHIN) 2 g in sodium chloride 0.9 % 100 mL IVPB        2 g 200 mL/hr over 30 Minutes Intravenous Every 24 hours 07/27/21 1717 08/03/21 1759   07/28/21 0600   metroNIDAZOLE (FLAGYL) IVPB 500 mg        500 mg 100 mL/hr over 60 Minutes Intravenous Every 12 hours 07/27/21 1717 08/04/21 0559   07/27/21 1700  cefTRIAXone (ROCEPHIN) 2 g in sodium chloride 0.9 % 100 mL IVPB        2 g 200 mL/hr over 30 Minutes Intravenous  Once 07/27/21 1654 07/27/21 1819   07/27/21 1700  metroNIDAZOLE (FLAGYL) IVPB 500 mg        500 mg 100 mL/hr over 60 Minutes Intravenous  Once 07/27/21 1654 07/27/21 1946       Assessment/Plan: 71 year old female with persistent diverticulitis with abscess.  Clinically not necessarily improving with persistent pain.  She is malnourished, frail, COPD on oxygen.  I do think that at some point time during this hospitalization may require definitive Hartman's procedure vs colectomy and loop ileostomy.  At this point time she is not peritonitic and does not need emergent surgical intervention.  She is very afraid of the prep and the diarrhea that may cause her as well as the nausea and vomiting.  We will hold off for the prep and may continue clear liquid diet. I spent at least more than 35 minutes in this encounter including coordination of her care, personally reviewing imaging studies, placing orders and performing appropriate documentation  Ronny Bacon, M.D., [facility]  07/30/2021 ; 9:42 AM

## 2021-07-30 NOTE — Progress Notes (Addendum)
PROGRESS NOTE    Brianna Briggs  LYY:[id number] DOB: 28-Apr-1950 DOA: 07/27/2021 PCP: Kirk Ruths, MD    Brief Narrative:  71 year old with history of coronary artery disease, dyslipidemia, solitary kidney and recurrent diverticulitis and diverticular abscess, recently admitted multiple times with lower abdominal pain and attempted to be treated with oral antibiotics and discharged 2 days ago came back to the hospital with persistent pain and epigastric discomfort after taking prescribed oral medications.  She was found to have persistent and increasing abscess on the left lower quadrant and intolerance to oral antibiotics.  Previously treated with IV antibiotics.  Admitted with surgical consultation for possible Hartmann procedure and colostomy.   7/15- no changes, seen by IR 7/14, drain no plans to change. Surgical planning. Will optimize nutrition preoperatively. Per Surgery- CLD for now.  Assessment & Plan:   Perforated diverticulitis with multiple abscess, acute recurrent diverticular abscess with abdominal pain: Failed conservative therapy.  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  CT noted abscess drain in good position, reinforced on 7/14. Continue Rocephin and Flagyl.  Adequate pain medications. Followed by surgery and discussing about surgical options this hospitalization, possible hartmans. Improve nutrition, pre operatively.  On clear liquid diet and laxative.  COPD: Stable.  On nocturnal oxygen.   Review of records indicated patient is not on maintenance prednisone.  Discontinue all steroids. Incentive spirometry, chest physiotherapy, will start patient on albuterol inhaler and Mucinex.  Hyperlipidemia: Statin on hold.  Electrolytes: Replaced.  Adequate.  Chronic hypertension: Asymptomatic.  Monitor.  No indication to increase blood pressures.   DVT prophylaxis: heparin injection 5,000 Units  Start: 07/28/21 0600 Place TED hose Start: 07/27/21 1714   Code Status: Full code Family Communication: None Disposition Plan: Status is: Inpatient Remains inpatient appropriate because: Inpatient procedures planned.  Failed outpatient therapy.     Consultants:  General surgery   Procedures:  None  Antimicrobials:  Rocephin and Flagyl 7/12---    Subjective:  Seen and examined.  Tearful and anxious.  Difficult to take large dose of GoLytely.  Having loose bowel movements.  Left lower quadrant hurts but not unusual for long time now.  Objective: Vitals:   07/29/21 2019 07/29/21 2047 07/30/21 0012 07/30/21 0534  BP: 108/66  108/61 105/65  Pulse: 65  88 75  Resp: '16  16 18  '$ Temp: 98.5 F (36.9 C)  (!) 101 F (38.3 C) 99.4 F (37.4 C)  TempSrc: Oral  Oral Oral  SpO2: 100% 97% 98% 94%  Weight:      Height:        Intake/Output Summary (Last 24 hours) at 07/30/2021 0752 Last data filed at 07/30/2021 0643 Gross per 24 hour  Intake 1352.18 ml  Output 20 ml  Net 1332.18 ml    Filed Weights   07/27/21 2227  Weight: 58.3 kg    Examination:  General exam: Frail and debilitated.  Anxious. Respiratory system: Upper airway sounds.  On 2 L oxygen. Cardiovascular system: S1 & S2 heard, RRR.  Gastrointestinal system: Soft.  Mildly tender left lower quadrant. Percutaneous drain with thick pus at the bottom.  Minimal drainage around the tube. Central nervous system: Alert and oriented. No focal neurological deficits. Extremities: Symmetric 5 x 5 power. Skin: No rashes, lesions or ulcers   Data Reviewed: I have personally reviewed following labs and imaging studies  CBC: Recent Labs  Lab 07/27/21 1355 07/28/21 0542 07/29/21 0425 07/30/21 0715  WBC  13.7* 6.7 2.9* 6.4  NEUTROABS 11.1*  --  2.4  --   HGB 14.3 11.6* 11.2* 10.6*  HCT 45.7 36.5 35.8* 33.0*  MCV 90.5 89.5 89.7 89.2  PLT 267 197 199 809    Basic Metabolic Panel: Recent Labs  Lab 07/27/21 1355  07/28/21 0542 07/29/21 0425  NA 138 139 138  K 3.9 3.3* 4.3  CL 91* 99 102  CO2 37* 33* 31  GLUCOSE 113* 88 111*  BUN 6* <5* 9  CREATININE 0.65 0.63 0.66  CALCIUM 9.1 8.3* 8.4*  MG  --   --  1.8  PHOS  --   --  3.8    GFR: Estimated Creatinine Clearance: 51 mL/min (by C-G formula based on SCr of 0.66 mg/dL). Liver Function Tests: Recent Labs  Lab 07/27/21 1355  AST 23  ALT 10  ALKPHOS 60  BILITOT 0.9  PROT 7.6  ALBUMIN 3.0*    No results for input(s): "LIPASE", "AMYLASE" in the last 168 hours.  No results for input(s): "AMMONIA" in the last 168 hours. Coagulation Profile: No results for input(s): "INR", "PROTIME" in the last 168 hours.  Cardiac Enzymes: No results for input(s): "CKTOTAL", "CKMB", "CKMBINDEX", "TROPONINI" in the last 168 hours. BNP (last 3 results) No results for input(s): "PROBNP" in the last 8760 hours. HbA1C: No results for input(s): "HGBA1C" in the last 72 hours. CBG: No results for input(s): "GLUCAP" in the last 168 hours. Lipid Profile: No results for input(s): "CHOL", "HDL", "LDLCALC", "TRIG", "CHOLHDL", "LDLDIRECT" in the last 72 hours. Thyroid Function Tests: No results for input(s): "TSH", "T4TOTAL", "FREET4", "T3FREE", "THYROIDAB" in the last 72 hours. Anemia Panel: No results for input(s): "VITAMINB12", "FOLATE", "FERRITIN", "TIBC", "IRON", "RETICCTPCT" in the last 72 hours. Sepsis Labs: Recent Labs  Lab 07/27/21 1355 07/27/21 1642 07/27/21 2235  PROCALCITON  --   --  <0.10  LATICACIDVEN 1.6 0.8  --      Recent Results (from the past 240 hour(s))  Culture, blood (single)     Status: None (Preliminary result)   Collection Time: 07/27/21  5:41 PM   Specimen: BLOOD  Result Value Ref Range Status   Specimen Description BLOOD BLOOD LEFT HAND  Final   Special Requests   Final    BOTTLES DRAWN AEROBIC AND ANAEROBIC Blood Culture results may not be optimal due to an inadequate volume of blood received in culture bottles   Culture    Final    NO GROWTH 3 DAYS Performed at [facility], [address], [place], [place] [address]    Report Status PENDING  Incomplete  Blood culture (routine x 2)     Status: None (Preliminary result)   Collection Time: 07/27/21  7:24 PM   Specimen: BLOOD  Result Value Ref Range Status   Specimen Description BLOOD BLOOD LEFT FOREARM  Final   Special Requests   Final    BOTTLES DRAWN AEROBIC AND ANAEROBIC Blood Culture adequate volume   Culture   Final    NO GROWTH 3 DAYS Performed at [facility], [address], [place], [place] [address]    Report Status PENDING  Incomplete  Blood culture (routine x 2)     Status: None (Preliminary result)   Collection Time: 07/27/21 10:35 PM   Specimen: BLOOD  Result Value Ref Range Status   Specimen Description BLOOD LEFT WRIST  Final   Special Requests   Final    BOTTLES DRAWN AEROBIC AND ANAEROBIC Blood Culture results may not be  optimal due to an excessive volume of blood received in culture bottles   Culture   Final    NO GROWTH 3 DAYS Performed at [facility], [address], [place], [place] [address]    Report Status PENDING  Incomplete         Radiology Studies: CT ABDOMEN PELVIS W CONTRAST  Result Date: 07/29/2021 CLINICAL DATA:  Intra-abdominal abscess.  Evaluate drain placement. EXAM: CT ABDOMEN AND PELVIS WITH CONTRAST TECHNIQUE: Multidetector CT imaging of the abdomen and pelvis was performed using the standard protocol following bolus administration of intravenous contrast. RADIATION DOSE REDUCTION: This exam was performed according to the departmental dose-optimization program which includes automated exposure control, adjustment of the mA and/or kV according to patient size and/or use of iterative reconstruction technique. CONTRAST:  149m OMNIPAQUE IOHEXOL 300 MG/ML  SOLN COMPARISON:  CT of the abdomen and pelvis July 27, 2021 FINDINGS: Lower chest: No acute abnormality. Hepatobiliary: High  attenuation material in the gallbladder identified without wall thickening or adjacent fat stranding. The liver is unremarkable. Pancreas: Unremarkable. No pancreatic ductal dilatation or surrounding inflammatory changes. Spleen: Normal in size without focal abnormality. Adrenals/Urinary Tract: The 8 mm left adrenal myelolipoma is stable. No change the adrenal glands. The right kidney remains atrophic with cystic changes. The proximal right ureter remains prominent. The cyst in the medial left kidney is stable. Another cyst located posteriorly on image 34 is stable. No follow-up imaging recommended for the cysts. The left kidney is otherwise normal. The left adrenal gland is unremarkable. The bladder is unremarkable. Stomach/Bowel: The stomach and small bowel are within normal limits. There is persistent wall thickening and stranding along the sigmoid colon. The pigtail drainage catheter is stable, extending along the posterior aspect of the sigmoid colon. The small abscess adjacent to the pigtail catheter measures 2.1 by 0.6 cm today versus 2.7 x 0.8 cm previously. Anteriorly laterally is another abscess which may communicate which measures 3.0 by 1.3 cm on sagittal image 97 today versus 3.0 x 1.6 cm on the same image previously. This abscess tracks to the adjacent colon as identified on sagittal image 101. This track contained mostly air previously but now contains mostly fluid today. This abscess also appears to track laterally and anteriorly as identified on sagittal image 102 and axial image 61. This tract appears to contain primarily air. The amount of fat stranding surrounding the affected loop of colon is similar to slightly worsened. The colon is otherwise unremarkable. Visualized appendix is normal. Vascular/Lymphatic: Calcified atherosclerotic changes are seen in the nonaneurysmal aorta. No adenopathy. Reproductive: Uterus and bilateral adnexa are unremarkable. Other: A small amount of fluid is identified  posteriorly in the pelvis, not identified previously. This fluid appears to be free and is likely reactive. No free air. Musculoskeletal: No change IMPRESSION: 1. Persistent abscess related to acute sigmoid diverticulitis. A portion of the abscess measuring 2.1 x 0.6 cm today measured 2.7 x 0.8 cm previously. Anteriorly and laterally is another abscess which may be connected to the first measuring 3.0 x 1.3 cm today versus 3.0 x 1.6 cm previously. Two tracks extend off this 3.0 x 1.3 cm abscess today. There is a fluid-filled tract extending to the colon which was air-filled before but unchanged in size. There is an anterior tract which is more conspicuous in the interval but primarily containing air. The amount of fat stranding in the region of the abscesses in colon is stable to mildly worsened. 2. Probable sludge or stones in the  gallbladder. 3. There is a small amount of free fluid posteriorly in the pelvis, likely reactive. 4. No other significant interval changes. Electronically Signed   By: Dorise Bullion III M.D.   On: 07/29/2021 16:21        Scheduled Meds:  acidophilus  1 capsule Oral TID   albuterol  3 mL Inhalation Q6H   guaiFENesin  600 mg Oral BID   heparin  5,000 Units Subcutaneous Q8H   Continuous Infusions:  sodium chloride 100 mL/hr at 07/29/21 1621   cefTRIAXone (ROCEPHIN)  IV 200 mL/hr at 07/29/21 1745   metronidazole 500 mg (07/30/21 0620)      LOS: 2 days    Time spent: 35 minutes    Vanna Scotland, MD [facility] Pager [phone]

## 2021-07-31 ENCOUNTER — Inpatient Hospital Stay: Payer: Medicare Other | Admitting: Anesthesiology

## 2021-07-31 ENCOUNTER — Encounter
Admission: EM | Disposition: A | Discharge status: Skilled Nursing Facility | Payer: Self-pay | Source: Home / Self Care | Attending: Internal Medicine

## 2021-07-31 ENCOUNTER — Other Ambulatory Visit: Payer: Self-pay

## 2021-07-31 HISTORY — PX: PARTIAL COLECTOMY: SHX5273

## 2021-07-31 HISTORY — PX: LAPAROTOMY: SHX154

## 2021-07-31 LAB — TYPE AND SCREEN
ABO/RH(D): O POS
Antibody Screen: NEGATIVE

## 2021-07-31 LAB — ABO/RH: ABO/RH(D): O POS

## 2021-07-31 SURGERY — LAPAROTOMY, EXPLORATORY
Anesthesia: General | Site: Abdomen

## 2021-07-31 MED ORDER — PHENYLEPHRINE 80 MCG/ML (10ML) SYRINGE FOR IV PUSH (FOR BLOOD PRESSURE SUPPORT)
PREFILLED_SYRINGE | INTRAVENOUS | Status: AC
  Filled 2021-07-31: qty 10

## 2021-07-31 MED ORDER — SODIUM CHLORIDE 0.9 % IV SOLN: INTRAVENOUS | Status: DC

## 2021-07-31 MED ORDER — LIDOCAINE HCL (CARDIAC) PF 100 MG/5ML IV SOSY
PREFILLED_SYRINGE | INTRAVENOUS | Status: DC | PRN
  Administered 2021-07-31: 50 mg via INTRAVENOUS

## 2021-07-31 MED ORDER — BUPIVACAINE-EPINEPHRINE (PF) 0.5% -1:200000 IJ SOLN
INTRAMUSCULAR | Status: DC | PRN
  Administered 2021-07-31: 30 mL via PERINEURAL

## 2021-07-31 MED ORDER — SUGAMMADEX SODIUM 200 MG/2ML IV SOLN
INTRAVENOUS | Status: DC | PRN
  Administered 2021-07-31: 150 mg via INTRAVENOUS

## 2021-07-31 MED ORDER — ONDANSETRON HCL 4 MG/2ML IJ SOLN
INTRAMUSCULAR | Status: DC | PRN
  Administered 2021-07-31: 4 mg via INTRAVENOUS

## 2021-07-31 MED ORDER — DEXAMETHASONE SODIUM PHOSPHATE 10 MG/ML IJ SOLN
INTRAMUSCULAR | Status: AC
  Filled 2021-07-31: qty 1

## 2021-07-31 MED ORDER — BUPIVACAINE LIPOSOME 1.3 % IJ SUSP
INTRAMUSCULAR | Status: AC
  Filled 2021-07-31: qty 20

## 2021-07-31 MED ORDER — DEXAMETHASONE SODIUM PHOSPHATE 10 MG/ML IJ SOLN
INTRAMUSCULAR | Status: DC | PRN
  Administered 2021-07-31: 6 mg via INTRAVENOUS

## 2021-07-31 MED ORDER — SUCCINYLCHOLINE CHLORIDE 200 MG/10ML IV SOSY
PREFILLED_SYRINGE | INTRAVENOUS | Status: AC
  Filled 2021-07-31: qty 10

## 2021-07-31 MED ORDER — DROPERIDOL 2.5 MG/ML IJ SOLN
INTRAMUSCULAR | Status: AC
  Filled 2021-07-31: qty 2

## 2021-07-31 MED ORDER — ROCURONIUM BROMIDE 100 MG/10ML IV SOLN
INTRAVENOUS | Status: DC | PRN
  Administered 2021-07-31: 40 mg via INTRAVENOUS
  Administered 2021-07-31: 20 mg via INTRAVENOUS

## 2021-07-31 MED ORDER — PROPOFOL 10 MG/ML IV BOLUS
INTRAVENOUS | Status: DC | PRN
  Administered 2021-07-31: 150 mg via INTRAVENOUS

## 2021-07-31 MED ORDER — ACETAMINOPHEN 10 MG/ML IV SOLN
INTRAVENOUS | Status: AC
  Filled 2021-07-31: qty 100

## 2021-07-31 MED ORDER — LACTATED RINGERS IV SOLN: INTRAVENOUS | Status: DC | PRN

## 2021-07-31 MED ORDER — FENTANYL CITRATE (PF) 100 MCG/2ML IJ SOLN
INTRAMUSCULAR | Status: DC | PRN
  Administered 2021-07-31 (×2): 50 ug via INTRAVENOUS

## 2021-07-31 MED ORDER — FENTANYL CITRATE (PF) 100 MCG/2ML IJ SOLN
INTRAMUSCULAR | Status: AC
  Filled 2021-07-31: qty 2

## 2021-07-31 MED ORDER — SUCCINYLCHOLINE CHLORIDE 200 MG/10ML IV SOSY
PREFILLED_SYRINGE | INTRAVENOUS | Status: DC | PRN
  Administered 2021-07-31: 80 mg via INTRAVENOUS

## 2021-07-31 MED ORDER — DROPERIDOL 2.5 MG/ML IJ SOLN
0.6250 mg | Freq: Once | INTRAMUSCULAR | Status: AC | PRN
  Administered 2021-07-31: 0.625 mg via INTRAVENOUS

## 2021-07-31 MED ORDER — BUPIVACAINE HCL (PF) 0.25 % IJ SOLN
INTRAMUSCULAR | Status: AC
  Filled 2021-07-31: qty 60

## 2021-07-31 MED ORDER — PHENYLEPHRINE 80 MCG/ML (10ML) SYRINGE FOR IV PUSH (FOR BLOOD PRESSURE SUPPORT)
PREFILLED_SYRINGE | INTRAVENOUS | Status: DC | PRN
  Administered 2021-07-31 (×4): 80 ug via INTRAVENOUS

## 2021-07-31 MED ORDER — SODIUM CHLORIDE FLUSH 0.9 % IV SOLN
INTRAVENOUS | Status: AC
  Filled 2021-07-31: qty 10

## 2021-07-31 MED ORDER — 0.9 % SODIUM CHLORIDE (POUR BTL) OPTIME
TOPICAL | Status: DC | PRN
  Administered 2021-07-31: 500 mL
  Administered 2021-07-31: 1000 mL
  Administered 2021-07-31: 500 mL
  Administered 2021-07-31 (×2): 1000 mL

## 2021-07-31 MED ORDER — ACETAMINOPHEN 10 MG/ML IV SOLN: 1000.0000 mg | Freq: Once | INTRAVENOUS | Status: DC | PRN

## 2021-07-31 MED ORDER — PROMETHAZINE HCL 25 MG/ML IJ SOLN: 6.2500 mg | INTRAMUSCULAR | Status: DC | PRN

## 2021-07-31 MED ORDER — LIDOCAINE HCL (PF) 2 % IJ SOLN
INTRAMUSCULAR | Status: AC
  Filled 2021-07-31: qty 5

## 2021-07-31 MED ORDER — ALBUTEROL SULFATE (2.5 MG/3ML) 0.083% IN NEBU
3.0000 mL | INHALATION_SOLUTION | Freq: Three times a day (TID) | RESPIRATORY_TRACT | Status: DC
Start: 2021-07-31 — End: 2021-08-02
  Administered 2021-08-01 – 2021-08-02 (×4): 3 mL via RESPIRATORY_TRACT
  Filled 2021-07-31 (×6): qty 3

## 2021-07-31 MED ORDER — ACETAMINOPHEN 10 MG/ML IV SOLN
INTRAVENOUS | Status: DC | PRN
  Administered 2021-07-31: 1000 mg via INTRAVENOUS

## 2021-07-31 MED ORDER — HYDROMORPHONE HCL 1 MG/ML IJ SOLN: 0.2500 mg | INTRAMUSCULAR | Status: DC | PRN

## 2021-07-31 MED ORDER — ROCURONIUM BROMIDE 10 MG/ML (PF) SYRINGE
PREFILLED_SYRINGE | INTRAVENOUS | Status: AC
  Filled 2021-07-31: qty 10

## 2021-07-31 MED ORDER — ONDANSETRON HCL 4 MG/2ML IJ SOLN
INTRAMUSCULAR | Status: AC
  Filled 2021-07-31: qty 2

## 2021-07-31 MED ORDER — FENTANYL CITRATE (PF) 100 MCG/2ML IJ SOLN: 25.0000 ug | INTRAMUSCULAR | Status: DC | PRN

## 2021-07-31 MED ORDER — BUPIVACAINE LIPOSOME 1.3 % IJ SUSP
INTRAMUSCULAR | Status: DC | PRN
  Administered 2021-07-31: 20 mL

## 2021-07-31 MED ORDER — ALBUMIN HUMAN 5 % IV SOLN
25.0000 g | Freq: Once | INTRAVENOUS | Status: AC
Start: 2021-07-31 — End: 2021-07-31
  Administered 2021-07-31: 25 g via INTRAVENOUS
  Filled 2021-07-31: qty 500

## 2021-07-31 SURGICAL SUPPLY — 53 items
APL PRP STRL LF DISP 70% ISPRP (MISCELLANEOUS) ×1
BLADE BOVIE TIP EXT 4 (BLADE) ×1 IMPLANT
BLADE SURG 15 STRL LF DISP TIS (BLADE) ×2 IMPLANT
BLADE SURG 15 STRL SS (BLADE) ×2
CHLORAPREP W/TINT 26 (MISCELLANEOUS) ×3 IMPLANT
DRAPE LAPAROTOMY 100X77 ABD (DRAPES) ×3 IMPLANT
DRSG OPSITE POSTOP 4X10 (GAUZE/BANDAGES/DRESSINGS) ×3 IMPLANT
DRSG OPSITE POSTOP 4X8 (GAUZE/BANDAGES/DRESSINGS) ×3 IMPLANT
ELECT BLADE 6.5 EXT (BLADE) ×1 IMPLANT
ELECT REM PT RETURN 9FT ADLT (ELECTROSURGICAL) ×2
ELECTRODE REM PT RTRN 9FT ADLT (ELECTROSURGICAL) ×2 IMPLANT
GAUZE 4X4 16PLY ~~LOC~~+RFID DBL (SPONGE) ×3 IMPLANT
GLOVE BIO SURGEON STRL SZ 6.5 (GLOVE) ×6 IMPLANT
GLOVE BIOGEL PI IND STRL 6.5 (GLOVE) ×4 IMPLANT
GLOVE BIOGEL PI INDICATOR 6.5 (GLOVE) ×2
GOWN STRL REUS W/ TWL LRG LVL3 (GOWN DISPOSABLE) ×12 IMPLANT
GOWN STRL REUS W/TWL LRG LVL3 (GOWN DISPOSABLE) ×8
HOLDER FOLEY CATH W/STRAP (MISCELLANEOUS) ×3 IMPLANT
KIT TURNOVER KIT A (KITS) ×3 IMPLANT
LABEL OR SOLS (LABEL) ×3 IMPLANT
LIGASURE IMPACT 36 18CM CVD LR (INSTRUMENTS) ×3 IMPLANT
MANIFOLD NEPTUNE II (INSTRUMENTS) ×3 IMPLANT
NEEDLE HYPO 22GX1.5 SAFETY (NEEDLE) ×6 IMPLANT
NS IRRIG 1000ML POUR BTL (IV SOLUTION) ×6 IMPLANT
NS IRRIG 500ML POUR BTL (IV SOLUTION) ×2 IMPLANT
PACK BASIN MAJOR ARMC (MISCELLANEOUS) ×3 IMPLANT
PACK COLON CLEAN CLOSURE (MISCELLANEOUS) ×3 IMPLANT
RELOAD BL CONTOUR (ENDOMECHANICALS) ×2 IMPLANT
RELOAD STAPLE 40 BLU REG (ENDOMECHANICALS) IMPLANT
SOL PREP PVP 2OZ (MISCELLANEOUS) ×2
SOLUTION PREP PVP 2OZ (MISCELLANEOUS) ×2 IMPLANT
SPONGE T-LAP 18X18 ~~LOC~~+RFID (SPONGE) ×12 IMPLANT
STAPLER CVD CUT BL 40 RELOAD (ENDOMECHANICALS) ×2 IMPLANT
STAPLER CVD CUT BLU 40 RELOAD (ENDOMECHANICALS) IMPLANT
STAPLER PROXIMATE 75MM BLUE (STAPLE) ×1 IMPLANT
STAPLER SKIN PROX 35W (STAPLE) ×3 IMPLANT
SURGILUBE 2OZ TUBE FLIPTOP (MISCELLANEOUS) ×3 IMPLANT
SUT PDS AB 0 CT1 27 (SUTURE) ×6 IMPLANT
SUT PDS AB 1 TP1 54 (SUTURE) ×6 IMPLANT
SUT SILK 2 0 (SUTURE) ×2
SUT SILK 2-0 18XBRD TIE 12 (SUTURE) ×2 IMPLANT
SUT SILK 3 0 (SUTURE) ×2
SUT SILK 3-0 (SUTURE) ×3 IMPLANT
SUT SILK 3-0 18XBRD TIE 12 (SUTURE) ×2 IMPLANT
SUT V-LOC 90 ABS 3-0 VLT  V-20 (SUTURE) ×4
SUT V-LOC 90 ABS 3-0 VLT V-20 (SUTURE) IMPLANT
SUT VIC AB 3-0 SH 27 (SUTURE) ×4
SUT VIC AB 3-0 SH 27X BRD (SUTURE) ×4 IMPLANT
SUT VICRYL 3-0 CR8 SH (SUTURE) ×2 IMPLANT
SUT VLOC 90 2/L VL 12 GS22 (SUTURE) ×1 IMPLANT
SYR 20ML LL LF (SYRINGE) ×6 IMPLANT
TRAY FOLEY MTR SLVR 16FR STAT (SET/KITS/TRAYS/PACK) ×3 IMPLANT
WATER STERILE IRR 500ML POUR (IV SOLUTION) ×4 IMPLANT

## 2021-07-31 NOTE — Anesthesia Preprocedure Evaluation (Addendum)
Anesthesia Evaluation  Patient identified by MRN, date of birth, ID band Patient awake    Reviewed: Allergy & Precautions, NPO status , Patient's Chart, lab work & pertinent test results  Airway Mallampati: I  TM Distance: >3 FB     Dental  (+) Missing   Pulmonary shortness of breath and with exertion, COPD,  COPD inhaler and oxygen dependent, Current Smoker and Patient abstained from smoking.,  Nocturnal oxygen  2L [place]         Cardiovascular hypertension, + CAD  Normal cardiovascular exam     Neuro/Psych negative neurological ROS  negative psych ROS   GI/Hepatic Neg liver ROS, Diverticulitis   Endo/Other  negative endocrine ROS  Renal/GU Renal diseasesolitary kidney  Female GU complaint     Musculoskeletal  (+) Arthritis , Osteoarthritis,    Abdominal Normal abdominal exam  (+)   Peds negative pediatric ROS (+)  Hematology negative hematology ROS (+)   Anesthesia Other Findings 25g albumin this am  Past Medical History: No date: Allergic genetic state 1996: Breast cancer, right ([place])     Comment:  Mastectomy No date: History of chicken pox No date: History of colon polyps No date: Hyperlipidemia No date: Single kidney  Reproductive/Obstetrics                           Anesthesia Physical  Anesthesia Plan  ASA: III  Anesthesia Plan: General   Post-op Pain Management: Toradol IV (intra-op)*, Ofirmev IV (intra-op)* and Regional block*   Induction: Intravenous and Rapid sequence  PONV Risk Score and Plan:   Airway Management Planned: Oral ETT  Additional Equipment:   Intra-op Plan:   Post-operative Plan: Extubation in OR  Informed Consent: I have reviewed the patients History and Physical, chart, labs and discussed the procedure including the risks, benefits and alternatives for the proposed anesthesia with the patient or authorized representative who has indicated  his/her understanding and acceptance.     Dental advisory given  Plan Discussed with: CRNA and Surgeon  Anesthesia Plan Comments:        Anesthesia Quick Evaluation

## 2021-07-31 NOTE — Progress Notes (Signed)
Patient ID: Brianna Briggs, female   DOB: [birth year], 71 y.o.   MRN: [id number]     [facility] Day(s): 3.   Interval History: Patient seen and examined, no acute events or new complaints overnight. Patient reports she feels tired.  She feels that she has not been able to sleep well due to left lower quadrant pain.  She also endorses that she is not eating well because of the pain.  She endorses that she has not improved since she got admitted.  Vital signs in last 24 hours: [min-max] current  Temp:  [97.9 F (36.6 C)-100.9 F (38.3 C)] 97.9 F (36.6 C) (07/16 0658) Pulse Rate:  [61-100] 77 (07/16 0658) Resp:  [14-18] 15 (07/16 0658) BP: (90-117)/(51-67) 90/51 (07/16 0658) SpO2:  [89 %-99 %] 93 % (07/16 0658)     Height: '5\' 2"'$  (157.5 cm) Weight: 58.3 kg BMI (Calculated): 23.5   Physical Exam:  Constitutional: alert, cooperative and no distress  Respiratory: breathing non-labored at rest  Cardiovascular: regular rate and sinus rhythm  Gastrointestinal: soft, tender in the left lower quadrant, and non-distended  Labs:     Latest Ref Rng & Units 07/30/2021    7:15 AM 07/29/2021    4:25 AM 07/28/2021    5:42 AM  CBC  WBC 4.0 - 10.5 K/uL 6.4  2.9  6.7   Hemoglobin 12.0 - 15.0 g/dL 10.6  11.2  11.6   Hematocrit 36.0 - 46.0 % 33.0  35.8  36.5   Platelets 150 - 400 K/uL 177  199  197       Latest Ref Rng & Units 07/29/2021    4:25 AM 07/28/2021    5:42 AM 07/27/2021    1:55 PM  CMP  Glucose 70 - 99 mg/dL 111  88  113   BUN 8 - 23 mg/dL 9  <5  6   Creatinine 0.44 - 1.00 mg/dL 0.66  0.63  0.65   Sodium 135 - 145 mmol/L 138  139  138   Potassium 3.5 - 5.1 mmol/L 4.3  3.3  3.9   Chloride 98 - 111 mmol/L 102  99  91   CO2 22 - 32 mmol/L 31  33  37   Calcium 8.9 - 10.3 mg/dL 8.4  8.3  9.1   Total Protein 6.5 - 8.1 g/dL   7.6   Total Bilirubin 0.3 - 1.2 mg/dL   0.9   Alkaline Phos 38 - 126 U/L   60   AST 15 - 41 U/L   23   ALT 0 - 44 U/L   10     Imaging  studies: No new pertinent imaging studies   Assessment/Plan:  71 y.o. female with recurrent diverticulitis, complicated by pertinent comorbidities including coronary artery disease, peripheral artery disease.  Complicated diverticulitis with abscess -S/p percutaneous drainage -She usually improved with IV antibiotic therapy but failed oral antibiotic therapy -In this admission she has not improved.  She continue with persistent left lower quadrant pain.  This pain is not letting her eat or even mobilize and at this point I think that optimization has not been working as during previous admissions -I think that patient will benefit of partial colectomy as soon as possible instead of continue waiting for optimization which is not working.  We will continue waiting she is only going to get weaker and more malnourished.  I discussed the recommendation with the patient.  I also discussed the risk of  surgery that includes bleeding, infection, injury to intestine, fistulas, needs subsequent surgery to repair any of these complications, ileus, obstruction and deterioration of medical comorbidities.  The patient endorses she understand these risks and agreed to proceed with surgery  Arnold Long, MD

## 2021-07-31 NOTE — Progress Notes (Signed)
Nutrition Follow Up Note   DOCUMENTATION CODES:   Non-severe (moderate) malnutrition in context of chronic illness  INTERVENTION:   RD will add supplements once pt's diet is advanced.   Recommend TPN if unable to advance diet in the next 48hrs  Pt at high refeed risk  Daily weights  NUTRITION DIAGNOSIS:   Moderate Malnutrition related to chronic illness, catabolic illness (tobacco use, COPD, SVT, diverticulitis) as evidenced by moderate fat depletion, moderate muscle depletion, energy intake < 75% for > or equal to 1 month. -ongoing   GOAL:   Patient will meet greater than or equal to 90% of their needs -not met   MONITOR:   Diet advancement, Labs, Weight trends, Skin, I & O's  ASSESSMENT:   71 y/o female with h/o COPD, HLD, breast cancer s/p mastectomy, CAD and diverticulitis s/p IR drain (may) who is admitted with recurrent diverticulitis with abscess.  RD working remotely.  RD received consult for new TPN. Spoke with MD, MD requests to hold on TPN for now as pt went to OR today for ex lap and possible partial colectomy. Pt with decreased oral intake during her admission r/t nausea. Would recommend TPN if pt is unable to advance her diet in the next 48hrs. Pt is at high refeed risk. No new weight since admit; will request daily weights.   Medications reviewed and include: risaquad, heparin, ceftriaxone, metronidazole   Labs reviewed: K 4.3 wnl, P 3.8 wnl, Mg 1.8 wnl Hgb 10.6(L), Hct 33.0(L)  Diet Order:   Diet Order             Diet NPO time specified  Diet effective now                  EDUCATION NEEDS:   Education needs have been addressed  Skin:  Skin Assessment: Reviewed RN Assessment  Last BM:  7/16  Height:   Ht Readings from Last 1 Encounters:  07/27/21 '5\' 2"'  (1.575 m)    Weight:   Wt Readings from Last 1 Encounters:  07/27/21 58.3 kg   BMI:  Body mass index is 23.51 kg/m.  Estimated Nutritional Needs:   Kcal:   1600-1800kcal/day  Protein:  80-90g/day  Fluid:  1.4-1.6L/day  Koleen Distance MS, RD, LDN Please refer to [facility] for RD and/or RD on-call/weekend/after hours pager

## 2021-07-31 NOTE — Op Note (Signed)
Preoperative diagnosis: Diverticular disease with perforation and abscess.  Postoperative diagnosis: Diverticular disease with perforation and abscess.  Procedure: Sigmoid colon resection with anastomosis                      Mobilization of splenic flexure                      Loop colostomy creation.   Anesthesia: GETA  Surgeon: Dr. Windell Moment, MD  Wound Classification: Contaminated  Indications:  Patient is a 71 y.o. female with smoldering diverticulitis with perforation and abscess not responding to percutaneous drainage and IV antibiotics. Partial colectomy indicated.   Description of procedure:  The patient was placed in the supine position and general endotracheal anesthesia was induced. A time-out was completed verifying correct patient, procedure, site, positioning, and implant(s) and/or special equipment prior to beginning this procedure. Preoperative antibiotics were given. A Foley catheter and nasogastric tube were placed. The abdomen was prepped and draped in the usual sterile fashion. A vertical midline incision was made from mid epigastric to just above the pubis. This was deepened through the subcutaneous tissues and hemostasis was achieved with electrocautery. The linea alba was identified and incised and the peritoneal cavity entered. The abdomen was explored. Adhesions were lysed sharply under direct vision with Metzenbaum scissors. Generalized peritonitis was found.  The small bowel was inspected and retracted to the right using a moist towel and self-retaining retractor. Using electrocautery, the colon was freed from its peritoneal attachments along the line of Toldt proximally from the splenic flexure and distally to the pelvic inlet. Both ureters were identified and protected. Omentum was freed from the transverse colon and the splenic flexure was mobilized.  Points of transection were selected proximally and distally. The bowel was divided with the Contour stapler device.  The peritoneum overlying the mesentery was then scored with electrocautery and mesentery divided with ligasure. Abscess cavity drained and drain was removed.  The specimen was removed. The abdominal cavity was then copiously irrigated and hemostasis was checked.  The splenic flexure was then mobilized. The proximal descending colon reached easily to the proximal rectum without tension. A hand swan anastomosis was created between distal descending colon and proximal rectum. This was created by aligning the staples line with a 2-0 V-Loc.  Colotomies.  In the descending colon and rectum.  Then the inner layer was done with 2 3-0 V-Loc circumferentially.  Then the second later was finished with the initial 2-0 V-Loc. Then attention was taken to creating a protective loop ileostomy.  About 15 lesions from the terminal ileum, a window was made in the mesentery of the ileum and the suction tubing was placed through the window. A disk of skin was removed from the colostomy site in the right lower quadrant. The incision was deepened through all layers of the abdominal wall and dilated to admit two fingers. The small bowel was passed out through the ostomy site without torsion or tension.    Then using a clean closure technique, the anterior fascia of the midline was closed with 0 PDS. The skin was closed with skin staples.  The loop ileostomy was matured with multiple interrupted sutures of 3-0 Vicryl. An ostomy bag was applied.  The patient tolerated the procedure well and was taken to the postanesthesia care unit in stable condition.   Specimen: Sigmoid colon  Complications: None  EBL: 50 mL

## 2021-07-31 NOTE — Transfer of Care (Signed)
Immediate Anesthesia Transfer of Care Note  Patient: Brianna Briggs  Procedure(s) Performed: EXPLORATORY LAPAROTOMY (Abdomen) ILOOP ILEOSTOMY (Abdomen)  Patient Location: PACU  Anesthesia Type:General  Level of Consciousness: drowsy  Airway & Oxygen Therapy: Patient Spontanous Breathing and Patient connected to face mask oxygen  Post-op Assessment: Report given to RN and Post -op Vital signs reviewed and stable  Post vital signs: Reviewed and stable  Last Vitals:  Vitals Value Taken Time  BP 93/56   Temp    Pulse 65 07/31/21 1638  Resp 12 07/31/21 1638  SpO2 98 % 07/31/21 1638  Vitals shown include unvalidated device data.  Last Pain:  Vitals:   07/31/21 1020  TempSrc:   PainSc: 0-No pain      Patients Stated Pain Goal: 0 (44/81/85 6314)  Complications: No notable events documented.

## 2021-07-31 NOTE — Progress Notes (Addendum)
PROGRESS NOTE    Brianna Briggs  ZOX:[id number] DOB: 1950/09/09 DOA: 07/27/2021 PCP: Kirk Ruths, MD    Brief Narrative:  71 year old with history of coronary artery disease, dyslipidemia, solitary kidney and recurrent diverticulitis and diverticular abscess, recently admitted multiple times with lower abdominal pain and attempted to be treated with oral antibiotics and discharged 2 days ago came back to the hospital with persistent pain and epigastric discomfort after taking prescribed oral medications.  She was found to have persistent and increasing abscess on the left lower quadrant and intolerance to oral antibiotics.  Previously treated with IV antibiotics.  Admitted with surgical consultation for possible Hartmann procedure and colostomy.   7/15- no changes, seen by IR 7/14, drain no plans to change. Surgical planning. Will optimize nutrition preoperatively. Per Surgery- [facility] for now. 7/16 NPO currently, ??surgical planning, will t/b Surgical team. Malnutrition-->?TPN, Dietician to help assess for caloric needs.  Assessment & Plan:   Perforated diverticulitis with multiple abscess, acute recurrent diverticular abscess with abdominal pain: Failed conservative therapy.  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  CT noted abscess drain in good position, reinforced on 7/14. Continue Rocephin and Flagyl.  Adequate pain medications. Followed by surgery and discussing about surgical options this hospitalization, possible hartmans. Improve nutrition, pre operatively.  On clear liquid diet and laxative.  COPD: Stable.  On nocturnal oxygen.   Review of records indicated patient is not on maintenance prednisone.  Discontinue all steroids. Incentive spirometry, chest physiotherapy, will start patient on albuterol inhaler and Mucinex.  Hyperlipidemia: Statin on hold.  Electrolytes: Replaced.  Adequate.  Chronic  hypertension: Asymptomatic.  Monitor.  No indication to increase blood pressures.   DVT prophylaxis: heparin injection 5,000 Units Start: 07/28/21 0600 Place TED hose Start: 07/27/21 1714   Code Status: Full code Family Communication: None Disposition Plan: Status is: Inpatient Remains inpatient appropriate because: Inpatient procedures planned.  Failed outpatient therapy.     Consultants:  General surgery   Procedures:  None  Antimicrobials:  Rocephin and Flagyl 7/12---    Subjective:  Seen and examined.  Tearful and anxious.  Difficult to take large dose of GoLytely.  Having loose bowel movements.  Left lower quadrant hurts but not unusual for long time now.  Objective: Vitals:   07/30/21 1634 07/30/21 2023 07/30/21 2026 07/31/21 0658  BP: 102/67 (!) 97/57  (!) 90/51  Pulse: 80 76 61 77  Resp: '18 16  15  '$ Temp: (!) 100.9 F (38.3 C) 98.3 F (36.8 C)  97.9 F (36.6 C)  TempSrc:      SpO2: 94% (!) 89% 95% 93%  Weight:      Height:        Intake/Output Summary (Last 24 hours) at 07/31/2021 0758 Last data filed at 07/31/2021 0309 Gross per 24 hour  Intake 3020.3 ml  Output 15 ml  Net 3005.3 ml    Filed Weights   07/27/21 2227  Weight: 58.3 kg    Examination:  General exam: Frail and debilitated.  Anxious. Respiratory system: Upper airway sounds.  On 2 L oxygen. Cardiovascular system: S1 & S2 heard, RRR.  Gastrointestinal system: Soft.  Mildly tender left lower quadrant. Percutaneous drain with thick pus at the bottom.  Minimal drainage around the tube. Central nervous system: Alert and oriented. No focal neurological deficits. Extremities: Symmetric 5 x 5 power. Skin: No rashes, lesions or ulcers   Data Reviewed: I have personally reviewed following  labs and imaging studies  CBC: Recent Labs  Lab 07/27/21 1355 07/28/21 0542 07/29/21 0425 07/30/21 0715  WBC 13.7* 6.7 2.9* 6.4  NEUTROABS 11.1*  --  2.4  --   HGB 14.3 11.6* 11.2* 10.6*  HCT  45.7 36.5 35.8* 33.0*  MCV 90.5 89.5 89.7 89.2  PLT 267 197 199 825    Basic Metabolic Panel: Recent Labs  Lab 07/27/21 1355 07/28/21 0542 07/29/21 0425  NA 138 139 138  K 3.9 3.3* 4.3  CL 91* 99 102  CO2 37* 33* 31  GLUCOSE 113* 88 111*  BUN 6* <5* 9  CREATININE 0.65 0.63 0.66  CALCIUM 9.1 8.3* 8.4*  MG  --   --  1.8  PHOS  --   --  3.8    GFR: Estimated Creatinine Clearance: 51 mL/min (by C-G formula based on SCr of 0.66 mg/dL). Liver Function Tests: Recent Labs  Lab 07/27/21 1355  AST 23  ALT 10  ALKPHOS 60  BILITOT 0.9  PROT 7.6  ALBUMIN 3.0*    No results for input(s): "LIPASE", "AMYLASE" in the last 168 hours.  No results for input(s): "AMMONIA" in the last 168 hours. Coagulation Profile: No results for input(s): "INR", "PROTIME" in the last 168 hours.  Cardiac Enzymes: No results for input(s): "CKTOTAL", "CKMB", "CKMBINDEX", "TROPONINI" in the last 168 hours. BNP (last 3 results) No results for input(s): "PROBNP" in the last 8760 hours. HbA1C: No results for input(s): "HGBA1C" in the last 72 hours. CBG: Recent Labs  Lab 07/30/21 1142  GLUCAP 88   Lipid Profile: No results for input(s): "CHOL", "HDL", "LDLCALC", "TRIG", "CHOLHDL", "LDLDIRECT" in the last 72 hours. Thyroid Function Tests: No results for input(s): "TSH", "T4TOTAL", "FREET4", "T3FREE", "THYROIDAB" in the last 72 hours. Anemia Panel: No results for input(s): "VITAMINB12", "FOLATE", "FERRITIN", "TIBC", "IRON", "RETICCTPCT" in the last 72 hours. Sepsis Labs: Recent Labs  Lab 07/27/21 1355 07/27/21 1642 07/27/21 2235  PROCALCITON  --   --  <0.10  LATICACIDVEN 1.6 0.8  --      Recent Results (from the past 240 hour(s))  Culture, blood (single)     Status: None (Preliminary result)   Collection Time: 07/27/21  5:41 PM   Specimen: BLOOD  Result Value Ref Range Status   Specimen Description BLOOD BLOOD LEFT HAND  Final   Special Requests   Final    BOTTLES DRAWN AEROBIC AND  ANAEROBIC Blood Culture results may not be optimal due to an inadequate volume of blood received in culture bottles   Culture   Final    NO GROWTH 4 DAYS Performed at [facility], [address], [place], [place] [address]    Report Status PENDING  Incomplete  Blood culture (routine x 2)     Status: None (Preliminary result)   Collection Time: 07/27/21  7:24 PM   Specimen: BLOOD  Result Value Ref Range Status   Specimen Description BLOOD BLOOD LEFT FOREARM  Final   Special Requests   Final    BOTTLES DRAWN AEROBIC AND ANAEROBIC Blood Culture adequate volume   Culture   Final    NO GROWTH 4 DAYS Performed at [facility], [address] [place], [place] [address]    Report Status PENDING  Incomplete  Blood culture (routine x 2)     Status: None (Preliminary result)   Collection Time: 07/27/21 10:35 PM   Specimen: BLOOD  Result Value Ref Range Status   Specimen Description BLOOD LEFT WRIST  Final  Special Requests   Final    BOTTLES DRAWN AEROBIC AND ANAEROBIC Blood Culture results may not be optimal due to an excessive volume of blood received in culture bottles   Culture   Final    NO GROWTH 4 DAYS Performed at [facility], [address] [place], [place] [address]    Report Status PENDING  Incomplete         Radiology Studies: CT ABDOMEN PELVIS W CONTRAST  Result Date: 07/29/2021 CLINICAL DATA:  Intra-abdominal abscess.  Evaluate drain placement. EXAM: CT ABDOMEN AND PELVIS WITH CONTRAST TECHNIQUE: Multidetector CT imaging of the abdomen and pelvis was performed using the standard protocol following bolus administration of intravenous contrast. RADIATION DOSE REDUCTION: This exam was performed according to the departmental dose-optimization program which includes automated exposure control, adjustment of the mA and/or kV according to patient size and/or use of iterative reconstruction technique. CONTRAST:  127m OMNIPAQUE IOHEXOL 300 MG/ML   SOLN COMPARISON:  CT of the abdomen and pelvis July 27, 2021 FINDINGS: Lower chest: No acute abnormality. Hepatobiliary: High attenuation material in the gallbladder identified without wall thickening or adjacent fat stranding. The liver is unremarkable. Pancreas: Unremarkable. No pancreatic ductal dilatation or surrounding inflammatory changes. Spleen: Normal in size without focal abnormality. Adrenals/Urinary Tract: The 8 mm left adrenal myelolipoma is stable. No change the adrenal glands. The right kidney remains atrophic with cystic changes. The proximal right ureter remains prominent. The cyst in the medial left kidney is stable. Another cyst located posteriorly on image 34 is stable. No follow-up imaging recommended for the cysts. The left kidney is otherwise normal. The left adrenal gland is unremarkable. The bladder is unremarkable. Stomach/Bowel: The stomach and small bowel are within normal limits. There is persistent wall thickening and stranding along the sigmoid colon. The pigtail drainage catheter is stable, extending along the posterior aspect of the sigmoid colon. The small abscess adjacent to the pigtail catheter measures 2.1 by 0.6 cm today versus 2.7 x 0.8 cm previously. Anteriorly laterally is another abscess which may communicate which measures 3.0 by 1.3 cm on sagittal image 97 today versus 3.0 x 1.6 cm on the same image previously. This abscess tracks to the adjacent colon as identified on sagittal image 101. This track contained mostly air previously but now contains mostly fluid today. This abscess also appears to track laterally and anteriorly as identified on sagittal image 102 and axial image 61. This tract appears to contain primarily air. The amount of fat stranding surrounding the affected loop of colon is similar to slightly worsened. The colon is otherwise unremarkable. Visualized appendix is normal. Vascular/Lymphatic: Calcified atherosclerotic changes are seen in the nonaneurysmal  aorta. No adenopathy. Reproductive: Uterus and bilateral adnexa are unremarkable. Other: A small amount of fluid is identified posteriorly in the pelvis, not identified previously. This fluid appears to be free and is likely reactive. No free air. Musculoskeletal: No change IMPRESSION: 1. Persistent abscess related to acute sigmoid diverticulitis. A portion of the abscess measuring 2.1 x 0.6 cm today measured 2.7 x 0.8 cm previously. Anteriorly and laterally is another abscess which may be connected to the first measuring 3.0 x 1.3 cm today versus 3.0 x 1.6 cm previously. Two tracks extend off this 3.0 x 1.3 cm abscess today. There is a fluid-filled tract extending to the colon which was air-filled before but unchanged in size. There is an anterior tract which is more conspicuous in the interval but primarily containing air. The amount of fat stranding in  the region of the abscesses in colon is stable to mildly worsened. 2. Probable sludge or stones in the gallbladder. 3. There is a small amount of free fluid posteriorly in the pelvis, likely reactive. 4. No other significant interval changes. Electronically Signed   By: Dorise Bullion III M.D.   On: 07/29/2021 16:21        Scheduled Meds:  acidophilus  1 capsule Oral TID   albuterol  3 mL Inhalation TID   guaiFENesin  600 mg Oral BID   heparin  5,000 Units Subcutaneous Q8H   Continuous Infusions:  sodium chloride 100 mL/hr at 07/30/21 1217   albumin human     cefTRIAXone (ROCEPHIN)  IV 2 g (07/30/21 1652)   metronidazole 500 mg (07/31/21 0539)      LOS: 3 days    Time spent: 35 minutes    Vanna Scotland, MD [facility] Pager [phone]

## 2021-07-31 NOTE — Anesthesia Procedure Notes (Signed)
Procedure Name: Intubation Date/Time: 07/31/2021 1:30 PM  Performed by: Aline Brochure, CRNAPre-anesthesia Checklist: Patient identified, Patient being monitored, Timeout performed, Emergency Drugs available and Suction available Patient Re-evaluated:Patient Re-evaluated prior to induction Oxygen Delivery Method: Circle system utilized Preoxygenation: Pre-oxygenation with 100% oxygen Induction Type: IV induction Ventilation: Mask ventilation without difficulty Laryngoscope Size: Mac and 3 Grade View: Grade I Tube type: Oral Tube size: 6.5 mm Number of attempts: 1 Airway Equipment and Method: Stylet and Video-laryngoscopy Placement Confirmation: ETT inserted through vocal cords under direct vision, positive ETCO2 and breath sounds checked- equal and bilateral Secured at: 21 cm Tube secured with: Tape Dental Injury: Teeth and Oropharynx as per pre-operative assessment

## 2021-08-01 ENCOUNTER — Inpatient Hospital Stay (HOSPITAL_COMMUNITY)
Admit: 2021-08-01 | Discharge: 2021-08-01 | Disposition: A | Discharge status: Home / Self Care | Payer: Medicare Other | Attending: General Surgery | Admitting: General Surgery

## 2021-08-01 ENCOUNTER — Encounter: Payer: Self-pay | Admitting: General Surgery

## 2021-08-01 DIAGNOSIS — I428 Other cardiomyopathies: Secondary | ICD-10-CM | POA: Diagnosis not present

## 2021-08-01 DIAGNOSIS — M545 Low back pain, unspecified: Secondary | ICD-10-CM

## 2021-08-01 LAB — CULTURE, BLOOD (ROUTINE X 2)
Culture: NO GROWTH
Culture: NO GROWTH
Special Requests: ADEQUATE

## 2021-08-01 LAB — ECHOCARDIOGRAM COMPLETE BUBBLE STUDY
AR max vel: 2.06 cm2
AV Area VTI: 2.34 cm2
AV Area mean vel: 1.93 cm2
AV Mean grad: 5.5 mmHg
AV Peak grad: 10.4 mmHg
Ao pk vel: 1.62 m/s
Area-P 1/2: 2.96 cm2
S' Lateral: 2.7 cm

## 2021-08-01 LAB — CULTURE, BLOOD (SINGLE): Culture: NO GROWTH

## 2021-08-01 MED ORDER — HYDROMORPHONE HCL 1 MG/ML IJ SOLN
0.5000 mg | Freq: Once | INTRAMUSCULAR | Status: AC | PRN
  Administered 2021-08-01: 0.5 mg via INTRAVENOUS
  Filled 2021-08-01: qty 1

## 2021-08-01 NOTE — Anesthesia Postprocedure Evaluation (Signed)
Anesthesia Post Note  Patient: Brianna Briggs  Procedure(s) Performed: EXPLORATORY LAPAROTOMY (Abdomen) ILOOP ILEOSTOMY (Abdomen)  Patient location during evaluation: PACU Anesthesia Type: General Level of consciousness: awake and alert Pain management: pain level controlled Vital Signs Assessment: post-procedure vital signs reviewed and stable Respiratory status: spontaneous breathing, nonlabored ventilation and respiratory function stable Cardiovascular status: blood pressure returned to baseline and stable Postop Assessment: no apparent nausea or vomiting Anesthetic complications: no   No notable events documented.   Last Vitals:  Vitals:   08/01/21 0650 08/01/21 0740  BP: 95/61 (!) 90/54  Pulse: 85 75  Resp: 18 16  Temp: 36.7 C 36.6 C  SpO2: 91% 91%    Last Pain:  Vitals:   08/01/21 0952  TempSrc:   PainSc: [place]

## 2021-08-01 NOTE — Progress Notes (Signed)
Patient ID: Brianna Briggs, female   DOB: [birth year], 72 y.o.   MRN: [id number]     [place] [facility] Day(s): 4.   Interval History: Patient seen and examined, no acute events or new complaints overnight. Patient reports feeling sore.  Denies any nausea or vomiting.  Endorses that there was significant liquid in the ostomy bag.  Pain slowly being controlled with pain medications.  Denies any nausea or vomiting.  Vital signs in last 24 hours: [min-max] current  Temp:  [97.4 F (36.3 C)-98.8 F (37.1 C)] 97.9 F (36.6 C) (07/17 0740) Pulse Rate:  [59-85] 75 (07/17 0740) Resp:  [13-22] 16 (07/17 0740) BP: (89-112)/(54-63) 90/54 (07/17 0740) SpO2:  [90 %-98 %] 92 % (07/17 1402) Weight:  [53.7 kg] 53.7 kg (07/17 0500)     Height: '5\' 2"'$  (157.5 cm) Weight: 53.7 kg BMI (Calculated): 21.65   Physical Exam:  Constitutional: alert, cooperative and no distress  Respiratory: breathing non-labored at rest  Cardiovascular: regular rate and sinus rhythm  Gastrointestinal: soft, non-tender, and non-distended.  Ileostomy pink and patent  Labs:     Latest Ref Rng & Units 07/30/2021    7:15 AM 07/29/2021    4:25 AM 07/28/2021    5:42 AM  CBC  WBC 4.0 - 10.5 K/uL 6.4  2.9  6.7   Hemoglobin 12.0 - 15.0 g/dL 10.6  11.2  11.6   Hematocrit 36.0 - 46.0 % 33.0  35.8  36.5   Platelets 150 - 400 K/uL 177  199  197       Latest Ref Rng & Units 07/29/2021    4:25 AM 07/28/2021    5:42 AM 07/27/2021    1:55 PM  CMP  Glucose 70 - 99 mg/dL 111  88  113   BUN 8 - 23 mg/dL 9  <5  6   Creatinine 0.44 - 1.00 mg/dL 0.66  0.63  0.65   Sodium 135 - 145 mmol/L 138  139  138   Potassium 3.5 - 5.1 mmol/L 4.3  3.3  3.9   Chloride 98 - 111 mmol/L 102  99  91   CO2 22 - 32 mmol/L 31  33  37   Calcium 8.9 - 10.3 mg/dL 8.4  8.3  9.1   Total Protein 6.5 - 8.1 g/dL   7.6   Total Bilirubin 0.3 - 1.2 mg/dL   0.9   Alkaline Phos 38 - 126 U/L   60   AST 15 - 41 U/L   23   ALT 0 - 44 U/L   10      Imaging studies: No new pertinent imaging studies   Assessment/Plan:  71 y.o. female with complaint of diverticulitis 1 Day Post-Op s/p partial colectomy with anastomosis and loop ileostomy creation.  -Started to have output from ileostomy -We will advance diet to full liquids -Encourage the patient to get out of bed -We will continue with pain management -We will discontinue IV antibiotic therapy -Appreciate hospitalist medical management of her medical comorbidities.  Arnold Long, MD

## 2021-08-01 NOTE — Consult Note (Signed)
[place] Nurse ostomy follow up Patient receiving care in [facility]. Loop ileostomy surgery performed yesterday 7/16 by Dr. Windell Moment Stoma type/location: RUQ Stomal assessment/size: pink, moist as viewed through existing ostomy pouch. Peristomal assessment: deferred Treatment options for stomal/peristomal skin: barrier ring Output: green effluent  Ostomy pouching: 2pc. 1 and 3/4 inch two piece system with filter placed after surgery. I obtained and placed 2 and 1/4 inch two piece system without filter and barrier rings placed in room for future use. Education provided: how to open, empty, clean and close pouch. I also provided the [employer] education folder. Enrolled patient in [place] Discharge program: No  She tells me she lives with her husband, who is having his own health issues, so she is not sure how much help he will be able to provide. She is also feeling "loopy" today. We talked about the effects of medications associated with surgery.  Val Riles, RN, MSN, CWOCN, CNS-BC, pager [phone]

## 2021-08-01 NOTE — Progress Notes (Signed)
*  PRELIMINARY RESULTS* Echocardiogram 2D Echocardiogram has been performed.  Brianna Briggs 08/01/2021, 10:07 AM

## 2021-08-01 NOTE — Progress Notes (Addendum)
PROGRESS NOTE    Brianna Briggs  ENI:[id number] DOB: 1950-12-28 DOA: 07/27/2021 PCP: Kirk Ruths, MD    Brief Narrative:  71 year old with history of coronary artery disease, dyslipidemia, solitary kidney and recurrent diverticulitis and diverticular abscess, recently admitted multiple times with lower abdominal pain and attempted to be treated with oral antibiotics and discharged 2 days ago came back to the hospital with persistent pain and epigastric discomfort after taking prescribed oral medications.  She was found to have persistent and increasing abscess on the left lower quadrant and intolerance to oral antibiotics.  Previously treated with IV antibiotics.  Admitted with surgical consultation for possible Hartmann procedure and colostomy.   7/15- no changes, seen by IR 7/14, drain no plans to change. Surgical planning. Will optimize nutrition preoperatively. Per Surgery- CLD for now. 7/16 NPO currently, ??surgical planning, will t/b Surgical team. Malnutrition-->?TPN, Dietician to help assess for caloric needs. 7/17- S/p Sigmoid resection, loop colostomy on 7/16, wound care evaluation and ongoing nutrition discussion.   Assessment & Plan:   Perforated diverticulitis with multiple abscess, acute recurrent diverticular abscess with abdominal pain: Failed conservative therapy.  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  CT noted abscess drain in good position, reinforced on 7/14. Stopped Rocephin and Flagyl given recent surgical management.  Adequate pain medications. 7/17- S/p Sigmoid resection, loop colostomy on 7/16  COPD: Stable.  On nocturnal oxygen.   Review of records indicated patient is not on maintenance prednisone.  Discontinue all steroids. Incentive spirometry, chest physiotherapy, will start patient on albuterol inhaler and Mucinex.  Hyperlipidemia: Statin on hold.  Electrolytes: Replaced.   Adequate.  Chronic hypertension: Asymptomatic.  Monitor.  No indication to increase blood pressures.  DVT prophylaxis: heparin injection 5,000 Units Start: 07/28/21 0600 Place TED hose Start: 07/27/21 1714  Code Status: Full code Family Communication: None Disposition Plan: Status is: Inpatient Remains inpatient appropriate because: Inpatient procedures planned.  Failed outpatient therapy.   Consultants:  General surgery  Procedures:  None  Antimicrobials:  Rocephin and Flagyl 7/12---7/17  Subjective:  NAEON. Doing well. Post op from OR yesterday  Objective: Vitals:   08/01/21 0243 08/01/21 0500 08/01/21 0650 08/01/21 0740  BP: 112/63  95/61 (!) 90/54  Pulse: 69  85 75  Resp: '20  18 16  '$ Temp: 98.8 F (37.1 C)  98 F (36.7 C) 97.9 F (36.6 C)  TempSrc: Oral  Oral   SpO2: 91%  91% 91%  Weight:  53.7 kg    Height:        Intake/Output Summary (Last 24 hours) at 08/01/2021 0824 Last data filed at 08/01/2021 0656 Gross per 24 hour  Intake 900 ml  Output 2650 ml  Net -1750 ml    Filed Weights   07/27/21 2227 08/01/21 0500  Weight: 58.3 kg 53.7 kg    Examination:  General exam: Frail and debilitated.  Anxious. Respiratory system: Upper airway sounds.  On 2 L oxygen. Cardiovascular system: S1 & S2 heard, RRR.  Gastrointestinal system: Soft.  Mildly tender left lower quadrant. Percutaneous drain with thick pus at the bottom.  Minimal drainage around the tube. Central nervous system: Alert and oriented. No focal neurological deficits. Extremities: Symmetric 5 x 5 power. Skin: No rashes, lesions or ulcers   Data Reviewed: I have personally reviewed following labs and imaging studies  CBC: Recent Labs  Lab 07/27/21 1355 07/28/21 0542 07/29/21 0425 07/30/21 0715  WBC 13.7* 6.7 2.9* 6.4  NEUTROABS 11.1*  --  2.4  --   HGB 14.3 11.6* 11.2* 10.6*  HCT 45.7 36.5 35.8* 33.0*  MCV 90.5 89.5 89.7 89.2  PLT 267 197 199 151    Basic Metabolic Panel: Recent  Labs  Lab 07/27/21 1355 07/28/21 0542 07/29/21 0425  NA 138 139 138  K 3.9 3.3* 4.3  CL 91* 99 102  CO2 37* 33* 31  GLUCOSE 113* 88 111*  BUN 6* <5* 9  CREATININE 0.65 0.63 0.66  CALCIUM 9.1 8.3* 8.4*  MG  --   --  1.8  PHOS  --   --  3.8    GFR: Estimated Creatinine Clearance: 51 mL/min (by C-G formula based on SCr of 0.66 mg/dL). Liver Function Tests: Recent Labs  Lab 07/27/21 1355  AST 23  ALT 10  ALKPHOS 60  BILITOT 0.9  PROT 7.6  ALBUMIN 3.0*    No results for input(s): "LIPASE", "AMYLASE" in the last 168 hours.  No results for input(s): "AMMONIA" in the last 168 hours. Coagulation Profile: No results for input(s): "INR", "PROTIME" in the last 168 hours.  Cardiac Enzymes: No results for input(s): "CKTOTAL", "CKMB", "CKMBINDEX", "TROPONINI" in the last 168 hours. BNP (last 3 results) No results for input(s): "PROBNP" in the last 8760 hours. HbA1C: No results for input(s): "HGBA1C" in the last 72 hours. CBG: Recent Labs  Lab 07/30/21 1142  GLUCAP 88    Lipid Profile: No results for input(s): "CHOL", "HDL", "LDLCALC", "TRIG", "CHOLHDL", "LDLDIRECT" in the last 72 hours. Thyroid Function Tests: No results for input(s): "TSH", "T4TOTAL", "FREET4", "T3FREE", "THYROIDAB" in the last 72 hours. Anemia Panel: No results for input(s): "VITAMINB12", "FOLATE", "FERRITIN", "TIBC", "IRON", "RETICCTPCT" in the last 72 hours. Sepsis Labs: Recent Labs  Lab 07/27/21 1355 07/27/21 1642 07/27/21 2235  PROCALCITON  --   --  <0.10  LATICACIDVEN 1.6 0.8  --      Recent Results (from the past 240 hour(s))  Culture, blood (single)     Status: None   Collection Time: 07/27/21  5:41 PM   Specimen: BLOOD  Result Value Ref Range Status   Specimen Description BLOOD BLOOD LEFT HAND  Final   Special Requests   Final    BOTTLES DRAWN AEROBIC AND ANAEROBIC Blood Culture results may not be optimal due to an inadequate volume of blood received in culture bottles   Culture    Final    NO GROWTH 5 DAYS Performed at [facility], [address] [place], [place] [address]    Report Status 08/01/2021 FINAL  Final  Blood culture (routine x 2)     Status: None   Collection Time: 07/27/21  7:24 PM   Specimen: BLOOD  Result Value Ref Range Status   Specimen Description BLOOD BLOOD LEFT FOREARM  Final   Special Requests   Final    BOTTLES DRAWN AEROBIC AND ANAEROBIC Blood Culture adequate volume   Culture   Final    NO GROWTH 5 DAYS Performed at [facility], [address], [place], [place] [address]    Report Status 08/01/2021 FINAL  Final  Blood culture (routine x 2)     Status: None   Collection Time: 07/27/21 10:35 PM   Specimen: BLOOD  Result Value Ref Range Status   Specimen Description BLOOD LEFT WRIST  Final   Special Requests   Final    BOTTLES DRAWN AEROBIC AND ANAEROBIC Blood Culture results may not be optimal due to an excessive volume of  blood received in culture bottles   Culture   Final    NO GROWTH 5 DAYS Performed at [facility], [address] [place], [place] [address]    Report Status 08/01/2021 FINAL  Final         Radiology Studies: No results found.      Scheduled Meds:  acidophilus  1 capsule Oral TID   albuterol  3 mL Inhalation TID   guaiFENesin  600 mg Oral BID   heparin  5,000 Units Subcutaneous Q8H   Continuous Infusions:  sodium chloride 75 mL/hr at 07/31/21 2246   cefTRIAXone (ROCEPHIN)  IV 2 g (07/31/21 1954)   metronidazole 500 mg (08/01/21 0557)      LOS: 4 days    Time spent: 35 minutes    Vanna Scotland, MD [facility] Pager [phone]

## 2021-08-01 NOTE — Care Management Important Message (Signed)
Important Message  Patient Details  Name: Brianna Briggs MRN: [id number] Date of Birth: 18-Jul-1950   Medicare Important Message Given:  Yes     [name] 08/01/2021, 1:34 PM

## 2021-08-01 NOTE — Evaluation (Addendum)
Physical Therapy Evaluation Patient Details Name: Brianna Briggs MRN: [id number] DOB: 1950-10-01 Today's Date: 08/01/2021  History of Present Illness  Pt is a 71 y/o F admitted on 07/27/21 after presenting with c/o nausea & percutaneous drainage. Pt was found to have persistent & increasing abscess in LLQ & intolerance to oral antibiotics. 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 PMH: CAD, dyslipidemia, solitary kidney & recurrent diverticulitis & diverticular abscess, recently admitted multiple times with lower abdominal pain  Clinical Impression  Pt seen for PT evaluation with pt reporting prior to admission she lived with her husband in a 1 level home with 2 steps without rails to enter, was ambulatory without AD, but had recently stopped driving 2/2 pain. Pt received in room air with SpO2 reading of 66% but pt not in distress. Donned nasal cannula & pt required up to 4L/min O2 to increase SpO2 to >/= 88% with cuing for pursed lip breathing & coughing but pt limited 2/2 pain; pt required ~5 minutes to increase SpO2 to WNL. PT assisted pt to recliner via stand pivot with min assist without AD for improved positioning for better breathing. Pt left in care of nurse who was called to room at beginning of session.     Recommendations for follow up therapy are one component of a multi-disciplinary discharge planning process, led by the attending physician.  Recommendations may be updated based on patient status, additional functional criteria and insurance authorization.  Follow Up Recommendations Skilled nursing-short term rehab (<3 hours/day) Can patient physically be transported by private vehicle: No    Assistance Recommended at Discharge Frequent or constant Supervision/Assistance  Patient can return home with the following  A lot of help with walking and/or transfers;A lot of help with bathing/dressing/bathroom;Assistance with cooking/housework;Assist for  transportation;Help with stairs or ramp for entrance    Equipment Recommendations Rolling walker (2 wheels);BSC/3in1  Recommendations for Other Services    OT consult   Functional Status Assessment Patient has had a recent decline in their functional status and demonstrates the ability to make significant improvements in function in a reasonable and predictable amount of time.     Precautions / Restrictions Precautions Precautions: Fall Precaution Comments: new R side colostomy bag Restrictions Weight Bearing Restrictions: No      Mobility  Bed Mobility Overal bed mobility: Needs Assistance Bed Mobility: Supine to Sit           General bed mobility comments: Pt transfers supine>sit with HOB elevated with nurse stepping in to provide assistance despite uncertainty of pt requiring it    Transfers Overall transfer level: Needs assistance   Transfers: Bed to chair/wheelchair/BSC     Step pivot transfers: Min assist (cuing for hand placement, no AD)            Ambulation/Gait                  Stairs            Wheelchair Mobility    Modified Rankin (Stroke Patients Only)       Balance Overall balance assessment: Needs assistance Sitting-balance support: Feet supported, Bilateral upper extremity supported Sitting balance-Leahy Scale: Fair Sitting balance - Comments: supervision for static sitting                                     Pertinent Vitals/Pain Pain Assessment Pain Assessment: Faces Faces Pain  Scale: Hurts whole lot Pain Location: L side of abdomen Pain Descriptors / Indicators: Discomfort, Grimacing Pain Intervention(s): Limited activity within patient's tolerance, Monitored during session, Repositioned, Patient requesting pain meds-RN notified    Home Living Family/patient expects to be discharged to:: Private residence Living Arrangements: Spouse/significant other Available Help at Discharge: Family Type of  Home: House Home Access: Stairs to enter Entrance Stairs-Rails: None Entrance Stairs-Number of Steps: 2   Home Layout: One level Home Equipment: None Additional Comments: Pt notes she recently was given supplemental O2 to wear at night.    Prior Function Prior Level of Function : Independent/Modified Independent             Mobility Comments: Pt reports she was independent without AD, recently stopped driving 2/2 pain, reports 1 fall 2/2 tripping over the dog.       Hand Dominance        Extremity/Trunk Assessment   Upper Extremity Assessment Upper Extremity Assessment: Generalized weakness    Lower Extremity Assessment Lower Extremity Assessment: Generalized weakness       Communication      Cognition Arousal/Alertness: Awake/alert Behavior During Therapy: WFL for tasks assessed/performed Overall Cognitive Status: Within Functional Limits for tasks assessed                                          General Comments      Exercises     Assessment/Plan    PT Assessment Patient needs continued PT services  PT Problem List Decreased range of motion;Decreased strength;Cardiopulmonary status limiting activity;Pain;Decreased activity tolerance;Decreased balance;Decreased mobility;Decreased safety awareness;Decreased knowledge of use of DME       PT Treatment Interventions DME instruction;Therapeutic exercise;Gait training;Balance training;Stair training;Neuromuscular re-education;Functional mobility training;Patient/family education;Therapeutic activities    PT Goals (Current goals can be found in the Care Plan section)  Acute Rehab PT Goals Patient Stated Goal: decreased pain PT Goal Formulation: With patient Time For Goal Achievement: 08/15/21 Potential to Achieve Goals: Good    Frequency Min 2X/week     Co-evaluation               AM-PAC PT "6 Clicks" Mobility  Outcome Measure Help needed turning from your back to your side  while in a flat bed without using bedrails?: A Little Help needed moving from lying on your back to sitting on the side of a flat bed without using bedrails?: A Little Help needed moving to and from a bed to a chair (including a wheelchair)?: A Little Help needed standing up from a chair using your arms (e.g., wheelchair or bedside chair)?: A Little Help needed to walk in hospital room?: A Lot Help needed climbing 3-5 steps with a railing? : Total 6 Click Score: 15    End of Session Equipment Utilized During Treatment: Oxygen Activity Tolerance: Treatment limited secondary to medical complications (Comment) Patient left: in chair;with chair alarm set;with call bell/phone within reach;with nursing/sitter in room Nurse Communication: Mobility status (O2) PT Visit Diagnosis: Unsteadiness on feet (R26.81);Muscle weakness (generalized) (M62.81);Difficulty in walking, not elsewhere classified (R26.2);Pain Pain - Right/Left: Left Pain - part of body:  (side of abdomen)    Time: 1345-1401 PT Time Calculation (min) (ACUTE ONLY): 16 min   Charges:   PT Evaluation $PT Eval Moderate Complexity: [place], PT, DPT 08/01/21, 2:19 PM  Waunita Schooner 08/01/2021, 2:16 PM

## 2021-08-02 DIAGNOSIS — A419 Sepsis, unspecified organism: Secondary | ICD-10-CM | POA: Diagnosis not present

## 2021-08-02 DIAGNOSIS — K5780 Diverticulitis of intestine, part unspecified, with perforation and abscess without bleeding: Secondary | ICD-10-CM | POA: Diagnosis not present

## 2021-08-02 LAB — SURGICAL PATHOLOGY

## 2021-08-02 MED ORDER — HYDROCODONE-ACETAMINOPHEN 7.5-325 MG/15ML PO SOLN
15.0000 mL | ORAL | Status: DC | PRN
  Administered 2021-08-02 (×2): 15 mL via ORAL
  Filled 2021-08-02 (×8): qty 15

## 2021-08-02 MED ORDER — DIPHENHYDRAMINE HCL 12.5 MG/5ML PO ELIX: 12.5000 mg | ORAL_SOLUTION | Freq: Three times a day (TID) | ORAL | Status: DC | PRN

## 2021-08-02 MED ORDER — HYDROMORPHONE HCL 1 MG/ML IJ SOLN
INTRAMUSCULAR | Status: AC
  Filled 2021-08-02: qty 1

## 2021-08-02 MED ORDER — ACETAMINOPHEN 650 MG RE SUPP: 650.0000 mg | Freq: Four times a day (QID) | RECTAL | Status: DC | PRN

## 2021-08-02 MED ORDER — HYDROMORPHONE HCL 1 MG/ML IJ SOLN
1.0000 mg | INTRAMUSCULAR | Status: DC | PRN
  Administered 2021-08-02 – 2021-08-09 (×11): 1 mg via INTRAVENOUS
  Filled 2021-08-02 (×12): qty 1

## 2021-08-02 MED ORDER — OXYCODONE HCL 5 MG PO TABS: 5.0000 mg | ORAL_TABLET | ORAL | Status: DC | PRN

## 2021-08-02 MED ORDER — ALBUTEROL SULFATE (2.5 MG/3ML) 0.083% IN NEBU
3.0000 mL | INHALATION_SOLUTION | RESPIRATORY_TRACT | Status: DC | PRN
  Administered 2021-08-05 – 2021-08-06 (×2): 3 mL via RESPIRATORY_TRACT
  Filled 2021-08-02 (×2): qty 3

## 2021-08-02 MED ORDER — ACETAMINOPHEN 325 MG PO TABS: 650.0000 mg | ORAL_TABLET | ORAL | Status: DC | PRN

## 2021-08-02 MED ORDER — HYDROMORPHONE HCL 1 MG/ML IJ SOLN
0.5000 mg | INTRAMUSCULAR | Status: DC | PRN
  Administered 2021-08-02: 0.5 mg via INTRAVENOUS
  Filled 2021-08-02: qty 1

## 2021-08-02 NOTE — Assessment & Plan Note (Addendum)
Body mass index is 20.56 kg/m. As evidenced by signs/symptoms consistent with PCM - fat loss, muscle loss. -- RD consulted, appreciate assistance. -- Continue MVI, [employer] Instant Breakfast TID with meals -- Encourage PO intake

## 2021-08-02 NOTE — Progress Notes (Signed)
Physical Therapy Treatment Patient Details Name: Brianna Briggs MRN: [id number] DOB: 05/27/1950 Today's Date: 08/02/2021   History of Present Illness Pt is a 71 y/o F admitted on 07/27/21 after presenting with c/o nausea & percutaneous drainage. Pt was found to have persistent & increasing abscess in LLQ & intolerance to oral antibiotics. 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 PMH: CAD, dyslipidemia, solitary kidney & recurrent diverticulitis & diverticular abscess, recently admitted multiple times with lower abdominal pain    PT Comments    Pt seen for PT tx with pt agreeable to tx. Pt endorses ongoing pain in R side of abdomen today. Pt requires min assist with reliance on hospital bed features for supine>sit 2/2 pain & weakness. Pt is able to complete STS & step pivot with RW & CGA & cuing for technique. Pt progresses to ambulating ~10 ft in room with RW & CGA. PT educated pt on importance of & how to use incentive spirometer. Pt noted to have increased O2 needs & nurse called to room, see below.  Pt received on 4L/min via nasal cannula with SPO2 90% Increased to 5L/min for mobility & after gait SpO2 84% Provided seated rest break & cuing for pursed lip breathing but pt unable to tolerate being weaned down to 4L/min & ultimately required up to 6L/min & ~5 minutes to increase to 90% Pt reports "I feel like I'm going to hyperventilate" 2/2 pursed lip breathing. Nurse in room to assess, pt left on 6L/min.    Recommendations for follow up therapy are one component of a multi-disciplinary discharge planning process, led by the attending physician.  Recommendations may be updated based on patient status, additional functional criteria and insurance authorization.  Follow Up Recommendations  Skilled nursing-short term rehab (<3 hours/day) Can patient physically be transported by private vehicle: No   Assistance Recommended at Discharge Frequent or constant  Supervision/Assistance  Patient can return home with the following A lot of help with bathing/dressing/bathroom;Assistance with cooking/housework;Assist for transportation;Help with stairs or ramp for entrance;A lot of help with walking and/or transfers   Equipment Recommendations  Rolling walker (2 wheels);BSC/3in1    Recommendations for Other Services       Precautions / Restrictions Precautions Precautions: Fall Precaution Comments: new R side colostomy bag Restrictions Weight Bearing Restrictions: No     Mobility  Bed Mobility Overal bed mobility: Needs Assistance Bed Mobility: Supine to Sit     Supine to sit: Min assist     General bed mobility comments: HOB elevated, bed rails, assistance to upright trunk 2/2 pain    Transfers Overall transfer level: Needs assistance Equipment used: Rolling walker (2 wheels) Transfers: Bed to chair/wheelchair/BSC Sit to Stand: Supervision, Min guard   Step pivot transfers: Min guard       General transfer comment: cuing for hand placement to push to standing    Ambulation/Gait Ambulation/Gait assistance: Min guard Gait Distance (Feet): 10 Feet Assistive device: Rolling walker (2 wheels) Gait Pattern/deviations: Decreased step length - right, Decreased step length - left, Decreased stride length Gait velocity: decreased         Stairs             Wheelchair Mobility    Modified Rankin (Stroke Patients Only)       Balance Overall balance assessment: Needs assistance Sitting-balance support: Feet supported, Bilateral upper extremity supported Sitting balance-Leahy Scale: Fair Sitting balance - Comments: supervision for static sitting   Standing balance support: Bilateral upper  extremity supported, During functional activity Standing balance-Leahy Scale: Fair                              Cognition Arousal/Alertness: Awake/alert Behavior During Therapy: Flat affect Overall Cognitive Status:  Within Functional Limits for tasks assessed                                 General Comments: Pt reports "I'm starting to get scared", PT attempts to provide encouragement & reassurance.        Exercises      General Comments General comments (skin integrity, edema, etc.): PT assisted with doffing urine soiled mesh underwear. Educated pt on use & importance of use of incentive spirometer with pt return demonstrating x 1 time.      Pertinent Vitals/Pain Pain Assessment Pain Assessment: Faces Faces Pain Scale: Hurts even more Pain Location: R side of abdomen Pain Descriptors / Indicators: Discomfort, Grimacing Pain Intervention(s): Limited activity within patient's tolerance, Monitored during session, Repositioned    Home Living                          Prior Function            PT Goals (current goals can now be found in the care plan section) Acute Rehab PT Goals Patient Stated Goal: decreased pain, get better PT Goal Formulation: With patient Time For Goal Achievement: 08/15/21 Potential to Achieve Goals: Fair Progress towards PT goals: Progressing toward goals    Frequency    Min 2X/week      PT Plan Current plan remains appropriate    Co-evaluation              AM-PAC PT "6 Clicks" Mobility   Outcome Measure  Help needed turning from your back to your side while in a flat bed without using bedrails?: A Little Help needed moving from lying on your back to sitting on the side of a flat bed without using bedrails?: A Little Help needed moving to and from a bed to a chair (including a wheelchair)?: A Little Help needed standing up from a chair using your arms (e.g., wheelchair or bedside chair)?: A Little Help needed to walk in hospital room?: A Little Help needed climbing 3-5 steps with a railing? : Total 6 Click Score: 16    End of Session Equipment Utilized During Treatment: Oxygen Activity Tolerance: Treatment limited  secondary to medical complications (Comment) Patient left: in chair;with chair alarm set;with call bell/phone within reach;with nursing/sitter in room Nurse Communication: Mobility status (O2 needs) PT Visit Diagnosis: Unsteadiness on feet (R26.81);Muscle weakness (generalized) (M62.81);Difficulty in walking, not elsewhere classified (R26.2);Pain Pain - Right/Left: Right Pain - part of body:  (abdomen)     Time: 1448-1856 PT Time Calculation (min) (ACUTE ONLY): 27 min  Charges:  $Therapeutic Activity: 23-37 mins                     Lavone Nian, PT, DPT 08/02/21, 12:11 PM  Waunita Schooner 08/02/2021, 12:09 PM

## 2021-08-02 NOTE — Progress Notes (Signed)
Patient ID: Brianna Briggs, female   DOB: [birth year], 71 y.o.   MRN: [id number]     [place] [facility] Day(s): 5.   Interval History: Patient seen and examined, no acute events or new complaints overnight. Patient reports feeling sore.  She continued having intermittent pain in her back and her abdomen.  Endorses coughing.  Denies any nausea or vomiting.  Ileostomy having adequate output.  Vital signs in last 24 hours: [min-max] current  Temp:  [98.7 F (37.1 C)-99.4 F (37.4 C)] 99.4 F (37.4 C) (07/18 0803) Pulse Rate:  [76-84] 80 (07/18 0807) Resp:  [14-18] 16 (07/18 0807) BP: (92-97)/(55-73) 97/73 (07/18 0803) SpO2:  [88 %-97 %] 90 % (07/18 0807) Weight:  [57.7 kg] 57.7 kg (07/18 0500)     Height: '5\' 2"'$  (157.5 cm) Weight: 57.7 kg BMI (Calculated): 23.26   Physical Exam:  Constitutional: alert, cooperative and no distress  Respiratory: breathing non-labored at rest  Cardiovascular: regular rate and sinus rhythm  Gastrointestinal: soft, non-tender, and non-distended.  Ileostomy pink and patent  Labs:     Latest Ref Rng & Units 07/30/2021    7:15 AM 07/29/2021    4:25 AM 07/28/2021    5:42 AM  CBC  WBC 4.0 - 10.5 K/uL 6.4  2.9  6.7   Hemoglobin 12.0 - 15.0 g/dL 10.6  11.2  11.6   Hematocrit 36.0 - 46.0 % 33.0  35.8  36.5   Platelets 150 - 400 K/uL 177  199  197       Latest Ref Rng & Units 07/29/2021    4:25 AM 07/28/2021    5:42 AM 07/27/2021    1:55 PM  CMP  Glucose 70 - 99 mg/dL 111  88  113   BUN 8 - 23 mg/dL 9  <5  6   Creatinine 0.44 - 1.00 mg/dL 0.66  0.63  0.65   Sodium 135 - 145 mmol/L 138  139  138   Potassium 3.5 - 5.1 mmol/L 4.3  3.3  3.9   Chloride 98 - 111 mmol/L 102  99  91   CO2 22 - 32 mmol/L 31  33  37   Calcium 8.9 - 10.3 mg/dL 8.4  8.3  9.1   Total Protein 6.5 - 8.1 g/dL   7.6   Total Bilirubin 0.3 - 1.2 mg/dL   0.9   Alkaline Phos 38 - 126 U/L   60   AST 15 - 41 U/L   23   ALT 0 - 44 U/L   10     Imaging studies: No new  pertinent imaging studies   Assessment/Plan:  71 y.o. female with complaint of diverticulitis 2 Day Post-Op s/p partial colectomy with anastomosis and loop ileostomy creation.   -Adequate output in ileostomy bag this morning -We will advance diet to soft diet -Encourage the patient to get out of bed -We will continue with pain management --Appreciate hospitalist medical management of her medical comorbidities.  Arnold Long, MD

## 2021-08-02 NOTE — Discharge Instructions (Signed)
Ileostomy Nutrition Therapy   During ileostomy surgery, the entire colon, rectum, and anus are removed or bypassed. The part of the small intestine called the ileum is brought through the abdominal wall, creating a stoma. The stoma is an opening in the abdomen that must be covered with a bag to collect stools at all times. It can be temporary or permanent depending on the surgery.  After surgery, your bowel will be swollen. Your eating plan will begin with a diet of clear liquids. As you recover, you will start eating solid foods, beginning with foods that are low in fiber (see Recommended Foods). You should avoid high-fiber foods because they are harder to digest. Avoiding high-fiber foods will allow the bowel to heal and prevent blockage of the ileostomy.  You should have less than 8 grams of fiber per day when you move from liquids to solid foods and then transition to less than 13 grams of fiber for the whole day in the next few days as your symptoms decrease. Most patients begin to eat more normally 6 weeks after surgery.    The changes to your diet recommended on this handout can help reduce symptoms such as diarrhea, odor, and gas; help you avoid a blockage; and help your body get more nutrients from your food as you heal from surgery.   Tips   You may not feel like eating much, so eat small amounts every 2 to 4 hours. Keep a regular schedule for meals and snacks to help reduce gas and help your body absorb nutrients from foods.  Let your health care provider know if you see whole foods or pills in your ostomy bag.  Do not use time-released, enteric-coated medications or very large tablets. Also avoid laxatives, which can cause dehydration.  Take a chewable (non-gummy) multivitamin with minerals daily.  Take a chewable or liquid calcium supplement. Liquid calcium citrate can be taken with food or without food.  Have your largest meal in the middle of the day. Do not eat large amounts in the  evening. This can help decrease stool output at night, so that you can limit emptying the ostomy bag then.  Eat foods that may thicken stool several times a day. Some foods can change the color of the stool. (See the Food Selection Guide.)   When you begin to add more variety back into your diet, add only 1 new food every few days. If there are foods that bothered you before surgery, add other foods first. Eat only a small amount when you re-try foods. If a food makes you feel sick, wait a few weeks and then re-try it. Keep a log of foods you try and how you feel when eating them.  To reduce gas, avoid chewing gum, drinking with straws and drinking carbonated beverages, smoking or chewing tobacco, eating too fast, and skipping meals. Missing meals can increase gas and watery stools.   Some foods may cause blockages (see Food Selection Guide). Use caution when eating these foods. Eat small amounts and chew your foods well to prevent blockage.  Get enough fluids. Aim for at least 8 to 10 cups of liquid per day. Drink liquids 30 minutes after meals or snacks to avoid flushing foods through your system too quickly.  During times of higher output (1,800 milliliters per day or more) or heavy sweating, you need to drink more fluids. You may need to actually measure how much you are drinking and your output from your ostomy  when it is high:   1 ounce is 30 milliliters o 1 cup is 8 ounces  8 cups is 64 ounces or 2 quarts or 2 liters  Watch for signs and symptoms of fluid-electrolyte imbalance. If symptoms occur, seek treatment right away. Symptoms include:   Dry mouth  Reduced urine output (not as much urine or urinating less often than normal)  Dark-colored urine  Feeling dizzy when you stand up  Noticeable fatigue (feeling extremely tired)  Abdominal cramping  If you have a high output ostomy, you may need to use an oral rehydration solution (ORS) to replace fluid loss. The World Health Organization has a  solution in powder form you can buy (called Oral Rehydration Salts). Some sports drinks can increase stoma output, so pediatric electrolyte solutions, such as Pedialyte, are recommended instead. A less expensive option is to make the oral rehydration solution yourself using one of the following recipes:   2 cups Gatorade + 2 cups water +  teaspoon salt  3 cups water + 1 cup orange juice +  teaspoon salt +  teaspoon baking soda o  cup grape juice or cranberry juice + 3 cups water +  teaspoon salt o 1 cup apple juice + 3 cups water +  teaspoon salt  4 cups (1 liter) water +  teaspoon table salt + 6 level teaspoons sugar (World Health Organization's ORS recipe)  Your registered dietitian nutritionist or doctor may suggest increasing foods that are higher in sodium and potassium. Remember to choose lower fiber foods that are high in potassium. Some examples of high-potassium foods include soy milk, yogurt, cottage cheese, potatoes without skin, liquid supplements (such as Ensure Muscle Health), orange juice or tomato juice (if these do not cause reflux or other symptoms), smooth peanut butter, and baked or broiled salmon or [place]. Salt substitute that contains potassium chloride can also be used, but be careful not to overuse it. Higher-sodium foods added to the diet should also be lower fiber and not fried.   Recommended Foods  If particular foods make you feel unwell, stop eating them and try again in 2 to 3 weeks.  Everyone's tolerance to foods is different. Finding foods that are best for you may require some trial and error.   Dairy Foods  Recommended Foods  Notes   Fat-free (skim) or low-fat (1%) milk*  Soy milk, rice milk, or almond milk  Lactose-free milk  Yogurt*  Powdered milk  Cheese*  Buttermilk  Low-fat ice cream* or sherbet  If you feel unwell after drinking milk or eating dairy foods, try lactose-free products. Foods marked with an asterisk (*) on this list have lactose.   Aged cheeses (such as cheddar and Swiss) are lower in lactose.  Check labels for calcium content of almond milk and rice milk to make sure they have 30% calcium. These beverages are not high in protein, so include other high-protein foods at meals and snacks to support healing.  Soy milk may cause gas and bloating for some people. It is best to avoid soy milk if it causes symptoms.    Protein Foods  Recommended Foods  Notes   Very tender, well-cooked meats and poultry prepared without added fat  Fish  Smooth nut butter (limit to 1 to 2 tablespoons a day)  Eggs (scrambled eggs are easiest to digest)  When trying nuts, fish, and eggs, start with small amounts. These foods may cause odors.  Use a moist heating method for meats and poultry:  Use water or broth to cook the meat or poultry at a lower temperature.  Cover the dish when cooking in the oven, so the food cooks in its own juices.    Marinate meat first with an acidic ingredient, such as vinegar, lemon juice, or wine, and some oil or by using chopped raw pineapple, which has natural enzymes. Pour off the marinade before cooking.     Grains  Recommended Foods  Notes   Grain foods made from white or refined flour, including bread, bagels, rolls; crackers; pasta; and cereal  White rice  Cream of wheat or cream of rice  Refined grits  Cereals made from refined grains, without added fiber, such as rice chex or cornflakes  Choose grain foods with less than 2 grams of fiber per serving. The grams of dietary fiber in 1 serving are listed on the Nutrition Facts label of packaged foods.  Read labels if you have problems with lactose. Any foods containing milk may contain lactose.    Vegetables  Recommended Foods  Notes   Well-cooked vegetables without seeds or skins, such as green beans or carrots  Potatoes without skin  Shredded lettuce on sandwich  Strained vegetable juice  Some vegetables may cause gas, blockages, or odors for some  people. See the Food Selection Guide for foods that may cause symptoms.    Fruits  Recommended Foods  Notes   Pulp-free fruit juices (except prune juice)  Ripe banana  Soft melons (watermelon or honeydew)  Peeled and cooked apple  Canned fruits (except pineapple), but avoid heavy syrup, which can make diarrhea worse  These can be added later with doctor's OK to increase fiber:  Some fruits may cause blockages. See the Food Selection Guide for foods that may cause symptoms.  Look for 100% fruit juices. These may need to be diluted in half or used to make oral rehydration solutions to be tolerated well.   ?  ?  Avocado  Orange or grapefruit without  membrane  Grapefruit and grapefruit juice should not be eaten when taking statin-type medications or budesonide (Entocort).    Fats   Recommended Foods  Notes   Any; olive oil and canola oil are good choices for heart health.  Start with very small amounts. Limit fats and oils to less than 8 teaspoons per day. Fats may cause symptoms or discomfort.  Spreads, butter contain lactose.    Beverages  Recommended Beverages  Notes   Water  Decaffeinated coffee or tea  Noncarbonated beverages  Rehydration beverages  Carbonated beverages may cause gas.     Foods Not Recommended    When you first start eating solid foods, avoid foods that are high in fiber, such as whole grains, dried beans, and most raw vegetables and fruits. (See Foods Recommended list for low-fiber foods. See Food Selection Guide lists for foods that cause symptoms.) ? Avoid acidic, spicy, fried, and greasy foods and foods high in sugar.  Limit food and drinks that contain sugar substitutes. Often diluted sugar-containing drinks do better. Cut fruit juice in half by adding an equal amount of water or more.  While you heal, avoid any foods that cause you to have odor, gas, diarrhea, or obstruction.  You should not have any salad or raw vegetables.  If you have kidney stones, you  should try to avoid foods that are high in oxalate. Your RDN may give you a more detailed list of high-oxalate foods if you are at risk. Some foods  that are high in oxalate include:   Grains: Wheat bran, wheat germ, whole wheat flour   Protein Foods: Beans, tofu, nuts  Vegetables: Beets, dark leafy greens, sweet potato  Beverages: Beer, cocoa, instant tea, instant coffee  Other: Carob, chocolate    Food Selection Guide for Ileostomy Patients   Foods That May Cause Blockage   Apples, unpeeled  Bean sprouts  Cabbage, raw  Casing on sausage  Celery  Chinese vegetables  Coconut  Coleslaw  Corn  Popcorn  Relishes and olives  Salad greens  Seeds and nuts  Spinach Tough, fibrous meats (for example, steak on grill)  Vegetable and fruit skins  Whole grains  Cucumbers  Dried fruit, raisins  Grapes  Green peppers  Mushrooms  Nuts  Peas  Pickles  Pineapple      Foods That May Cause Gas or Odor  Alcohol  Apples  Asparagus  Bananas  Beer  Broccoli  Brussels sprouts  Cabbage  Carbonated beverages  Cauliflower  Cheese, some types  Corn  Cucumber  Dairy products  Dried beans and peas  Eggs  Fatty foods  Fish  Grapes  Green pepper  Melons  Onions  Peanuts  Prunes  Radishes  Turnips     Foods That May Help Relieve Gas and Odor  Buttermilk  Cranberry juice  Parsley  Yogurt with active cultures      Diarrhea   Initially after surgery, the stool will be liquid and watery because of where the ileostomy is located in the intestine. The stool will gradually become thicker over the next few weeks (more like a consistency of pudding or oatmeal). There are some foods that can help make the stool thicker or thinner, but if changes to your diet do not improve stools, then your doctor may be able to recommend some medications to help.   Foods That May Cause Diarrhea (looser or more frequent stool)  Alcohol (including beer)  Apricots (and stone fruits)  Beans, baked or  legumes  Bran  Broccoli  Brussels sprouts  Cabbage  Caffeinated drinks  (especially hot)  Chocolate  Corn  Fried meats, fish poultry  Fruit juice: apple, grape, orange  Fruit: fresh, canned, or dried  Glucose-free foods containing mannitol or  sorbitol  Gum, sugar free   High-fat foods  High-sugar foods  Licorice  Milk and dairy foods  Nuts or seeds  Peaches (stone fruit)  Peas  Plums (stone fruit)  Prune juice or prunes  Soup  Spicy foods  Sugar-free substitutes  Tomatoes  Turnip greens/green leafy vegetables, raw  Wheat/whole grains  Wine    Foods That May Help Thicken Stool  Applesauce   Bananas   Barley (when OK to have fiber)   Cheese   Marshmallows   Oatmeal (when OK to have fiber)  Pasta (sauces may increase symptoms)  Peanut butter, creamy  Potatoes, no skin  Pretzels  Saltines  Tapioca  White bread (not high fiber)  White rice, boiled  Yogurt   Foods That May Discolor Stool   Turn stool red  Darken stool  Beets  Asparagus  Foods with red dye  Broccoli   Spinach      Ileostomy Sample 1-Day Menu  Note: It may help to not have beverages with meals or snacks, waiting 30 minutes before drinking. Fiber is rounded to the nearest 0.5g and listed in parentheses ( ) after foods that contain fiber.   Meal  Menu  Total Fiber   Breakfast  1  scrambled egg plain or with  cup low-fat, low-lactose cheese  English muffin or white toast (1.5 grams fiber) with 1 teaspoon margarine   cup unsweetened applesauce (1.5 grams fiber)  3.0 grams   30 minutes after breakfast  4 ounces cranberry grape juice diluted with 4 ounces water   1 cups (12 ounces) decaf tea     Lunch   2 ounces shaved or very thinly sliced roast beef with juices   cup mashed potatoes (1.5 grams fiber) (made with lactose-free milk or chicken broth and olive oil or very small amount of butter)   cup green beans, well-cooked (2.0 grams fiber)  1 cup canned peaches, extra light syrup (1.0 gram  fiber)  4.5 grams   30 minutes after lunch  1 cup (8 ounces) fat-free, soy, or lactose-free milk     Snack  8 ounces yogurt without fruits or nuts  1 ripe banana (3.0 grams fiber)    3.0 grams   30 min after snack  1 cup (8 ounces) sport drink or Pedialyte or Oral Rehydration Solution (ORS) if needed     Dinner  [place] sandwich: 2 ounces [place], 1 ounce Swiss cheese,  2 slices white bread (1.0 gram fiber)  2 ounces pretzels (1.0 gram fiber)  2.0 grams   30 minutes after dinner  1 cups water or Gatorade/Pedialyte or ORS     Snack   6 saltine crackers (0.5 grams fiber)  1 ounce low-fat cheddar cheese or [place]  0.5 grams   30 minutes after snack  1 cup (8 ounces) fat-free, soy, or lactose-free milk         13 grams fiber daily

## 2021-08-02 NOTE — Evaluation (Signed)
Occupational Therapy Evaluation Patient Details Name: Brianna Briggs MRN: [id number] DOB: 12-23-1950 Today's Date: 08/02/2021   History of Present Illness Pt is a 70 y/o F admitted on 07/27/21 after presenting with c/o nausea & percutaneous drainage. Pt was found to have persistent & increasing abscess in LLQ & intolerance to oral antibiotics. 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 PMH: CAD, dyslipidemia, solitary kidney & recurrent diverticulitis & diverticular abscess, recently admitted multiple times with lower abdominal pain   Clinical Impression   Patient presenting with decreased Ind in self care, balance, functional mobility/transfers, endurance, and safety awareness. Patient reports being mod I at baseline with husband in home. Pt recently having to get O2 for night time use only per pt report. She is on 4Ls this morning. Pt performing bed mobility to EOB with min A and able to scoot self forward with increased time and use of bedrail. 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 Pt brushes her teeth and wipes hands while seated on EOB with set up A to obtain needed items. Pt having difficulty just opening containers to eat with overall weakness.  Patient will benefit from acute OT to increase overall independence in the areas of ADLs, functional mobility, and safety awareness in order to safely discharge to next venue of care.      Recommendations for follow up therapy are one component of a multi-disciplinary discharge planning process, led by the attending physician.  Recommendations may be updated based on patient status, additional functional criteria and insurance authorization.   Follow Up Recommendations  Skilled nursing-short term rehab (<3 hours/day)    Assistance Recommended at Discharge Intermittent Supervision/Assistance   Patient can return home with the following A little help with walking and/or transfers;A little help with bathing/dressing/bathroom;Help with stairs or ramp for entrance;Assist for transportation;Assistance with cooking/housework    Functional Status Assessment  Patient has had a recent decline in their functional status and demonstrates the ability to make significant improvements in function in a reasonable and predictable amount of time.  Equipment Recommendations  Other (comment) (defer to next venue of care)       Precautions / Restrictions Precautions Precautions: Fall Precaution Comments: new R side colostomy bag Restrictions Weight Bearing Restrictions: No      Mobility Bed Mobility Overal bed mobility: Needs Assistance Bed Mobility: Supine to Sit, Rolling Rolling: Supervision   Supine to sit: Min assist     General bed mobility comments: assistance with trunk and good effort to scoot forwards for feet to be on floor    Transfers                   General transfer comment: Pt declined and wanting to work on eating breakfast      Balance Overall balance assessment: Needs assistance Sitting-balance support: Feet supported, Bilateral upper extremity supported Sitting balance-Leahy Scale: Fair Sitting balance - Comments: supervision for static sitting                                   ADL either performed or assessed with clinical judgement   ADL Overall ADL's : Needs assistance/impaired  Vision Patient Visual Report: No change from baseline              Pertinent Vitals/Pain Pain Assessment Pain Assessment: Faces Faces Pain Scale: Hurts even more Pain Location: R side of abdomen Pain Descriptors / Indicators: Discomfort, Grimacing Pain Intervention(s): Limited activity within patient's tolerance, Monitored during session, Repositioned        Extremity/Trunk  Assessment Upper Extremity Assessment Upper Extremity Assessment: Generalized weakness   Lower Extremity Assessment Lower Extremity Assessment: Generalized weakness          Cognition Arousal/Alertness: Awake/alert Behavior During Therapy: Flat affect Overall Cognitive Status: Within Functional Limits for tasks assessed                                 General Comments: Pt just generally not feeling well this session and concerned over home situation.     General Comments  PT assisted with doffing urine soiled mesh underwear. Educated pt on use & importance of use of incentive spirometer with pt return demonstrating x 1 time.            Home Living Family/patient expects to be discharged to:: Private residence Living Arrangements: Spouse/significant other Available Help at Discharge: Family Type of Home: House Home Access: Stairs to enter Technical brewer of Steps: 2 Entrance Stairs-Rails: None Home Layout: One level     Bathroom Shower/Tub: Sponge bathes at [place]: None   Additional Comments: Pt notes she recently was given supplemental O2 to wear at night.      Prior Functioning/Environment Prior Level of Function : Independent/Modified Independent             Mobility Comments: Pt reports she was independent without AD, recently stopped driving 2/2 pain, reports 1 fall 2/2 tripping over the dog. 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        OT Problem List: Decreased strength;Pain;Decreased activity tolerance;Decreased safety awareness;Impaired balance (sitting and/or standing);Decreased knowledge of use of DME or AE      OT Treatment/Interventions: Self-care/ADL training;Therapeutic activities;Therapeutic exercise;Energy conservation;Manual therapy;Balance training;DME and/or AE  instruction;Patient/family education    OT Goals(Current goals can be found in the care plan section) Acute Rehab OT Goals Patient Stated Goal: to get better OT Goal Formulation: With patient Time For Goal Achievement: 08/16/21 Potential to Achieve Goals: Good  OT Frequency: Min 2X/week       AM-PAC OT "6 Clicks" Daily Activity     Outcome Measure Help from another person eating meals?: A Little Help from another person taking care of personal grooming?: A Little Help from another person toileting, which includes using toliet, bedpan, or urinal?: A Little Help from another person bathing (including washing, rinsing, drying)?: A Little Help from another person to put on and taking off regular upper body clothing?: A Little Help from another person to put on and taking off regular lower body clothing?: A Little 6 Click Score: 18   End of Session Equipment Utilized During Treatment: Oxygen (4Ls) Nurse Communication: Mobility status  Activity Tolerance: Patient limited by fatigue Patient left: in bed;with call bell/phone within reach;with bed alarm set  OT Visit Diagnosis: Unsteadiness on feet (R26.81);Muscle weakness (generalized) (M62.81);Pain;History of falling (Z91.81) Pain - Right/Left: Right Pain - part of body:  (  abdomen)                Time: 1829-9371 OT Time Calculation (min): 14 min Charges:  OT General Charges $OT Visit: 1 Visit OT Evaluation $OT Eval Moderate Complexity: 1 [address], [place], OTR/L , [facility] ascom [phone]  08/02/21, 1:05 PM

## 2021-08-02 NOTE — Assessment & Plan Note (Addendum)
Recently started on nocturnal O2 at home ~2L but starting to have increased O2 demands on 7/18, felt due to atelectasis postoperatively. CXR on 7/19 showed atelectasis and some small effusions.  Treated with Lasix x 1 on 7/19. Pt found to be COVID-positive, completed treatment. --Now stable O2 sats on baseline 2 L/min O2. --Incentive spirometer

## 2021-08-02 NOTE — Progress Notes (Signed)
Progress Note    Jackalyn Haith   UKG:[id number]  DOB: [birth year]  DOA: 07/27/2021     5 PCP: Kirk Ruths, MD  Initial CC: abdominal pain  Hospital Course: Ms. Ebner is a 71 yo female with PMH 71 year old with history of CAD, HLD, solitary kidney, recurrent diverticulitis with abscess who presented with epigastric pain.  She had been on outpatient antibiotics with minimal improvement. CT abdomen/pelvis on admission showed persistent abscess attributed to acute sigmoid diverticulitis. She had initially been treated with percutaneous drainage and IV antibiotics as well. She was followed by general surgery and ultimately underwent sigmoid resection with anastomosis and loop ileostomy creation.  Interval History:  Patient complained of still having ongoing pain this morning.  She is declining trial of oxycodone due to causing facial itching in the past.  She is amenable for trial of hydrocodone.  Dilaudid has been helping but does not last long or fully take effect.  Assessment and Plan: * Sepsis ([place]) - Patient meets sepsis criteria with increased heart rate, leukocytosis, source of intra-abdominal abscess -S/p treatment with percutaneous drainage and prior IV antibiotic course - Now has undergone sigmoid resection with loop ileostomy creation - Antibiotics have now been discontinued  Diverticulitis - Complicated diverticulitis with abscess - now s/p sigmoid resection with loop ileostomy creation on 07/31/2021 -Diet has been advanced to soft diet per surgery - Continue monitoring ileostomy output  Abdominal pain - Patient still complaining of ongoing pain s/p surgery - She is hesitant for trying oxycodone - Continue Dilaudid for breakthrough - Trial of Vicodin today  Acute on chronic respiratory failure with hypoxia ([place]) - recently started on nocturnal O2 at home ~2L but starting to have increased O2 demand today - suspect some atelectasis postop - continue  aggressive IS use - wean O2 as able   Malnutrition of moderate degree - Patient's BMI is Body mass index is 23.27 kg/m.. - Patient has the following signs/symptoms consistent with PCM: (fat loss, muscle loss). - seen by RD, appreciate assistance. Continue plan per RD to include MVI, Ensure   Chronic obstructive pulmonary disease (COPD) ([place]) - No signs or symptoms of exacerbation at this time -Continue albuterol as needed -Patient quit smoking about 3 weeks ago  Tobacco abuse - Nicotine patch as needed ordered for nicotine craving - Counseled patient that tobacco abuse also decreases wound healing   Old records reviewed in assessment of this patient  Antimicrobials: Rocephin 07/27/2021 >> 07/31/2021 Flagyl 07/27/2021 >> 08/01/2021  DVT prophylaxis:  heparin injection 5,000 Units Start: 07/28/21 0600 Place TED hose Start: 07/27/21 1714   Code Status:   Code Status: Full Code  Mobility Assessment (last 72 hours)     Mobility Assessment     Row Name 08/02/21 1217 08/02/21 1208 08/01/21 1500 08/01/21 1412 07/31/21 2010   Does patient have an order for bedrest or is patient medically unstable -- -- No - Continue assessment -- No - Continue assessment   What is the highest level of mobility based on the progressive mobility assessment? Level 4 (Walks with assist in room) - Balance while marching in place and cannot step forward and back - Complete Level 4 (Walks with assist in room) - Balance while marching in place and cannot step forward and back - Complete Level 4 (Walks with assist in room) - Balance while marching in place and cannot step forward and back - Complete Level 3 (Stands with assist) - Balance while standing  and cannot march in place  Level 5 (Walks with assist in room/hall) - Balance while stepping forward/back and can walk in room with assist - Complete    Row Name 07/31/21 1020 07/31/21 1000 07/30/21 2130       Does patient have an order for bedrest or is patient  medically unstable No - Continue assessment -- No - Continue assessment     What is the highest level of mobility based on the progressive mobility assessment? Level 5 (Walks with assist in room/hall) - Balance while stepping forward/back and can walk in room with assist - Complete -- Level 5 (Walks with assist in room/hall) - Balance while stepping forward/back and can walk in room with assist - Complete              Disposition Plan:  Home with [facility] Status is: Inpt  Objective: Blood pressure 97/73, pulse 80, temperature 99.4 F (37.4 C), resp. rate 16, height '5\' 2"'$  (1.575 m), weight 57.7 kg, SpO2 90 %.  Examination:  Physical Exam Constitutional:      Appearance: Normal appearance.  HENT:     Head: Normocephalic and atraumatic.     Mouth/Throat:     Mouth: Mucous membranes are moist.  Eyes:     Extraocular Movements: Extraocular movements intact.  Cardiovascular:     Rate and Rhythm: Normal rate and regular rhythm.  Abdominal:     General: There is no distension.     Palpations: Abdomen is soft.     Tenderness: There is abdominal tenderness.     Comments: Small amount of stool and bloody drainage noted in ostomy bag; appropriately tender to palpation.  Honeycomb dressing in place with staples noted underneath along midline abdomen  Musculoskeletal:        General: No swelling.     Cervical back: Normal range of motion and neck supple.  Skin:    General: Skin is warm and dry.  Neurological:     General: No focal deficit present.     Mental Status: She is alert.  Psychiatric:        Mood and Affect: Mood normal.      Consultants:  General surgery   Procedures:  Sigmoid colon resection with anastomosis, mobilization of splenic flexure, loop ileostomy creation, 07/31/2021  Data Reviewed: No results found for this or any previous visit (from the past 24 hour(s)).  I have Reviewed nursing notes, Vitals, and Lab results since pt's last encounter. Pertinent lab results :  see above I have ordered test including BMP, CBC, Mg I have reviewed the last note from staff over past 24 hours I have discussed pt's care plan and test results with nursing staff, case manager   LOS: 5 days   Dwyane Dee, MD [facility] 08/02/2021, 3:36 PM

## 2021-08-02 NOTE — Consult Note (Signed)
[place] Nurse ostomy follow up Patient receiving care in [facility] 104. Stoma type/location: RUQ ileostomy Stomal assessment/size:  1.5 inches, round, red, moist, support rod in place, sutures intact Peristomal assessment: Beginning at the 9 o'clock area and going to 1 o'clock there is firmness and induration. The patient reports much tenderness to light touch at the 9 - 11 o'clock area, lessening as we go towards 1.  The patient has been having a lot of pain to the area since her surgery. This pain occurs even when she is not actively moving, when others are moving her for example.  I have reached out to Dr. Windell Moment and explained I would either like to talk to him about the patient, or for him to read my note I am creating now. He has viewed my Secure Chat message. Treatment options for stomal/peristomal skin: barrier ring Output: thin, green Ostomy pouching: 2pc. 2 and 1/4 inch system Education provided: Patient in too much pain today to teach. She did express concern that she might not be able to do ostomy care on her own. Enrolled patient in [place] Start Discharge program: No   Val Riles, RN, MSN, [facility], CNS-BC, pager [phone]

## 2021-08-02 NOTE — TOC Progression Note (Signed)
Transition of Care [facility]) - Progression Note    Patient Details  Name: Brianna Briggs MRN: [id number] Date of Birth: January 26, 1950  Transition of Care [facility]) CM/SW [place], RN Phone Number: 08/02/2021, 1:36 PM  Clinical Narrative:  Patient lives at home with her husband, who has been at home ill for 2 days.  Patient is concerned about his recovery time coinciding with her discharge, and his ability to care for her.  Patient states the couple has children but she is not sure if they have the availability to care for her at home.  RNCM discussed patient speaking to children about current situation.  Patient declined assistance from [facility] to contact family at this time.  RNCM discussed SNF recommendation.  Patient states she would like to start workup and review bed offers, but she is not certain about disposition, SNF or home with Home Health.  She is amenable to SNF workup, RNCM to communicated bed offers when they become available.     Expected Discharge Plan: [place] Barriers to Discharge: Continued Medical Work up  Expected Discharge Plan and Services Expected Discharge Plan: [place]   Discharge Planning Services: CM Consult   Living arrangements for the past 2 months: Single Family Home                                       Social Determinants of Health (SDOH) Interventions    Readmission Risk Interventions    07/28/2021    4:14 PM  Readmission Risk Prevention Plan  Transportation Screening Complete  PCP or Specialist Appt within 5-7 Days Complete  Home Care Screening Complete  Medication Review (RN CM) Complete

## 2021-08-02 NOTE — Assessment & Plan Note (Addendum)
Stable with no signs or symptoms of exacerbation. --Continue albuterol as needed --Patient quit smoking just a few weeks ago --Using nicotine patches

## 2021-08-02 NOTE — NC FL2 (Signed)
[place] LEVEL OF CARE SCREENING TOOL     IDENTIFICATION  Patient Name: Brianna Briggs Birthdate: March 03, 1950 Sex: female Admission Date (Current Location): 07/27/2021  [facility] and [place] Number:  Engineering geologist and Address:  [facility], [address], [place], [place] [address]      Provider Number: [id number]  Attending Physician Name and Address:  Dwyane Dee, MD  Relative Name and Phone Number:       Current Level of Care: Hospital Recommended Level of Care: [place] Prior Approval Number:    Date Approved/Denied:   PASRR Number: [id number]  Discharge Plan: SNF    Current Diagnoses: Patient Active Problem List   Diagnosis Date Noted   Perforated diverticulum of intestine 07/28/2021   Malnutrition of moderate degree 07/28/2021   Diverticulitis 07/27/2021   Abdominal pain 07/27/2021   Acute on chronic respiratory failure with hypoxia (HCC)    Hypotension    Colonic diverticular abscess 07/19/2021   Sepsis ([place]) 07/19/2021   Chronic obstructive pulmonary disease (COPD) ([place]) 07/19/2021   Diverticulitis large intestine 06/06/2021   Diverticulitis of large intestine with abscess without bleeding 07/17/2019   Chronic eczema 08/30/2015   Health care maintenance 03/01/2015   Simple chronic bronchitis ([place]) 03/01/2015   Shortness of breath 12/16/2014   Hyperlipidemia, mixed 06/04/2014   Hypercholesterolemia 02/26/2014   PVC (premature ventricular contraction) 09/04/2013   Tobacco abuse 09/04/2013    Orientation RESPIRATION BLADDER Height & Weight     Self, Time, Situation, Place  O2 (4l 90%) External catheter Weight: 57.7 kg Height:  '5\' 2"'$  (157.5 cm)  BEHAVIORAL SYMPTOMS/MOOD NEUROLOGICAL BOWEL NUTRITION STATUS      Ileostomy Diet (SOFT)  AMBULATORY STATUS COMMUNICATION OF NEEDS Skin    (AMBULATED 10 feet) Verbally Other (Comment) (Stoma type/location: RUQ ileostomy  Stomal  assessment/size:  1.5 inches, round, red, moist, support rod in place, sutures intact)                       Personal Care Assistance Level of Assistance  Bathing, Feeding, Dressing Bathing Assistance: Limited assistance Feeding assistance: Limited assistance Dressing Assistance: Limited assistance     Functional Limitations Info  Sight, Hearing, Speech Sight Info: Impaired Hearing Info: Adequate Speech Info: Adequate    SPECIAL CARE FACTORS FREQUENCY  PT (By licensed PT), OT (By licensed OT)     PT Frequency: min 5x weekly OT Frequency: Min 5x weekly            Contractures Contractures Info: Not present    Additional Factors Info  Code Status, Allergies Code Status Info: FULL CODE Allergies Info: Amoxicillin-pot Clavulanate Medium  Diarrhea, Rash Other reaction(s): Unknown  Biaxin (clarithromycin) Medium  Diarrhea Other reaction(s): Unknown  Oxycodone Medium  Itching   Codeine Low  Rash   Nitrofurantoin           Current Medications (08/02/2021):  This is the current hospital active medication list Current Facility-Administered Medications  Medication Dose Route Frequency Provider Last Rate Last Admin   0.9 %  sodium chloride infusion   Intravenous Continuous Dwyane Dee, MD 50 mL/hr at 08/02/21 1224 Rate Change at 08/02/21 1224   acidophilus (RISAQUAD) capsule 1 capsule  1 capsule Oral TID Herbert Pun, MD   1 capsule at 08/02/21 0920   albuterol (PROVENTIL) (2.5 MG/3ML) 0.083% nebulizer solution 3 mL  3 mL Inhalation Q4H PRN Dwyane Dee, MD       diphenhydrAMINE (BENADRYL) 12.5 MG/5ML  elixir 12.5 mg  12.5 mg Oral Q8H PRN Dwyane Dee, MD       guaiFENesin (MUCINEX) 12 hr tablet 600 mg  600 mg Oral BID Herbert Pun, MD   600 mg at 08/02/21 0920   heparin injection 5,000 Units  5,000 Units Subcutaneous Q8H Herbert Pun, MD   5,000 Units at 08/02/21 1313   HYDROcodone-acetaminophen (HYCET) 7.5-325 mg/15 ml solution 15 mL  15 mL  Oral Q4H PRN Dwyane Dee, MD   15 mL at 08/02/21 1311   HYDROmorphone (DILAUDID) injection 1 mg  1 mg Intravenous Q3H PRN Dwyane Dee, MD       nicotine (NICODERM CQ - dosed in mg/24 hours) patch 21 mg  21 mg Transdermal Daily PRN Herbert Pun, MD       ondansetron (ZOFRAN) tablet 4 mg  4 mg Oral Q6H PRN Herbert Pun, MD   4 mg at 07/29/21 2327   Or   ondansetron (ZOFRAN) injection 4 mg  4 mg Intravenous Q6H PRN Herbert Pun, MD   4 mg at 07/30/21 1347   Oral care mouth rinse  15 mL Mouth Rinse PRN Herbert Pun, MD       promethazine (PHENERGAN) tablet 12.5 mg  12.5 mg Oral Q6H PRN Herbert Pun, MD   12.5 mg at 07/30/21 2215   senna-docusate (Senokot-S) tablet 1 tablet  1 tablet Oral QHS PRN Herbert Pun, MD         Discharge Medications: Please see discharge summary for a list of discharge medications.  Relevant Imaging Results:  Relevant Lab Results:   Additional Information SSN [id number]  Pete Pelt, RN

## 2021-08-02 NOTE — Progress Notes (Addendum)
Nutrition Follow-up  DOCUMENTATION CODES:   Non-severe (moderate) malnutrition in context of chronic illness  INTERVENTION:   -MVI with minerals daily -Ensure Enlive po BID, each supplement provides 350 kcal and 20 grams of protein -Provided "Ileostomy Nutrition Therapy" handout from [facility]'s Nutrition Care Manual; attached to AVS/ discharge summary  NUTRITION DIAGNOSIS:   Moderate Malnutrition related to chronic illness, catabolic illness (tobacco use, COPD, SVT, diverticulitis) as evidenced by moderate fat depletion, moderate muscle depletion, energy intake < 75% for > or equal to 1 month.  Ongoing  GOAL:   Patient will meet greater than or equal to 90% of their needs  Progressing   MONITOR:   Diet advancement, Labs, Weight trends, Skin, I & O's  REASON FOR ASSESSMENT:   Malnutrition Screening Tool    ASSESSMENT:   71 y/o female with h/o COPD, HLD, breast cancer s/p mastectomy, CAD and diverticulitis s/p IR drain (may) who is admitted with recurrent diverticulitis with abscess.  7/16- s/p Procedure: Sigmoid colon resection with anastomosis                      Mobilization of splenic flexure                      ileostomy creation 7/17- diet advanced to full liquids 7/18- diet advanced to soft  Reviewed I/O's: -200 ml x 24 hours and +6.2 L since admission  UOP: 200 ml x 24 hours   Pt unavailable at time of visit. Attempted to speak with pt via call to hospital room phone, however, unable to reach.   Pt just advanced to soft diet. Noted variable oral intake; meal completions 0-100%.   Per MD notes, pt with adequate ileostomy output.  Medications reviewed and include 0.9% sodium chloride infusion @ 75 ml/hr.   Labs reviewed.   Diet Order:   Diet Order             DIET SOFT Room service appropriate? Yes; Fluid consistency: Thin  Diet effective now                   EDUCATION NEEDS:   Education needs have been addressed  Skin:  Skin Assessment:  Skin Integrity Issues: Skin Integrity Issues:: Incisions Incisions: closed abdomen  Last BM:  08/02/21 (via ileostomy)  Height:   Ht Readings from Last 1 Encounters:  07/27/21 '5\' 2"'$  (1.575 m)    Weight:   Wt Readings from Last 1 Encounters:  08/02/21 57.7 kg   BMI:  Body mass index is 23.27 kg/m.  Estimated Nutritional Needs:   Kcal:  3716-9678  Protein:  100-115 grams  Fluid:  > 1.7 L    Loistine Chance, RD, LDN, [place] Registered Dietitian II Certified Diabetes Care and Education Specialist Please refer to [facility] for RD and/or RD on-call/weekend/after hours pager

## 2021-08-03 ENCOUNTER — Inpatient Hospital Stay: Payer: Medicare Other

## 2021-08-03 DIAGNOSIS — A419 Sepsis, unspecified organism: Secondary | ICD-10-CM | POA: Diagnosis not present

## 2021-08-03 DIAGNOSIS — J9621 Acute and chronic respiratory failure with hypoxia: Secondary | ICD-10-CM | POA: Diagnosis not present

## 2021-08-03 DIAGNOSIS — K5720 Diverticulitis of large intestine with perforation and abscess without bleeding: Secondary | ICD-10-CM | POA: Diagnosis not present

## 2021-08-03 MED ORDER — HYDROCODONE-ACETAMINOPHEN 7.5-325 MG PO TABS
1.0000 | ORAL_TABLET | ORAL | Status: DC | PRN
  Administered 2021-08-03 – 2021-08-16 (×21): 1 via ORAL
  Filled 2021-08-03 (×21): qty 1

## 2021-08-03 MED ORDER — DIPHENHYDRAMINE HCL 25 MG PO CAPS: 25.0000 mg | ORAL_CAPSULE | Freq: Three times a day (TID) | ORAL | Status: DC | PRN

## 2021-08-03 MED ORDER — FUROSEMIDE 10 MG/ML IJ SOLN
40.0000 mg | Freq: Once | INTRAMUSCULAR | Status: AC
Start: 2021-08-03 — End: 2021-08-03
  Administered 2021-08-03: 40 mg via INTRAVENOUS
  Filled 2021-08-03: qty 4

## 2021-08-03 MED ORDER — PROCHLORPERAZINE EDISYLATE 10 MG/2ML IJ SOLN: 10.0000 mg | Freq: Four times a day (QID) | INTRAMUSCULAR | Status: DC | PRN

## 2021-08-03 NOTE — TOC Progression Note (Signed)
Transition of Care [facility]) - Progression Note    Patient Details  Name: Brianna Briggs MRN: [id number] Date of Birth: 19-Jul-1950  Transition of Care [facility]) CM/SW [place], RN Phone Number: 08/03/2021, 1:53 PM  Clinical Narrative:   Patient has bed offers from [facility], [facility] and [facility].  RNCM provided patient with Medicare ratings to compare and review for choice.  Patient stated she was not feeling well and will review information.  She asked for RNCM to return late today or tomorrow.    Expected Discharge Plan: [place] Barriers to Discharge: Continued Medical Work up  Expected Discharge Plan and Services Expected Discharge Plan: [place]   Discharge Planning Services: CM Consult   Living arrangements for the past 2 months: Single Family Home                                       Social Determinants of Health (SDOH) Interventions    Readmission Risk Interventions    07/28/2021    4:14 PM  Readmission Risk Prevention Plan  Transportation Screening Complete  PCP or Specialist Appt within 5-7 Days Complete  Home Care Screening Complete  Medication Review (RN CM) Complete

## 2021-08-03 NOTE — Progress Notes (Signed)
Progress Note    Brianna Briggs   ZDG:[id number]  DOB: May 27, 1950  DOA: 07/27/2021     6 PCP: Kirk Ruths, MD  Initial CC: abdominal pain  Hospital Course: [name] is a 71 yo female with PMH 71 year old with history of CAD, HLD, solitary kidney, recurrent diverticulitis with abscess who presented with epigastric pain.  She had been on outpatient antibiotics with minimal improvement. CT abdomen/pelvis on admission showed persistent abscess attributed to acute sigmoid diverticulitis. She had initially been treated with percutaneous drainage and IV antibiotics as well. She was followed by general surgery and ultimately underwent sigmoid resection with anastomosis and loop ileostomy creation.  Interval History:  No events overnight. Still nauseous this morning and hadn't eaten much. Also does not like the taste of pain syrup and is okay going back to a pill form to try.   Assessment and Plan: * Sepsis due to diverticulitis with abscess [facility]) - Patient meets sepsis criteria with increased heart rate, leukocytosis, source of intra-abdominal abscess -S/p treatment with percutaneous drainage and prior IV antibiotic course - Now has undergone sigmoid resection with loop ileostomy creation - Antibiotics have now been discontinued  Diverticulitis of intestine with abscess - Complicated diverticulitis with abscess - now s/p sigmoid resection with loop ileostomy creation on 07/31/2021 -Diet has been advanced to soft diet per surgery; still having some nausea and not taking in much since yesterday - adjusted nausea regimen today - Continue monitoring ileostomy output  Abdominal pain - Patient still complaining of ongoing pain s/p surgery - She is hesitant for trying oxycodone - Continue Dilaudid for breakthrough - Trial of Vicodin today  Acute on chronic respiratory failure with hypoxia ([place]) - recently started on nocturnal O2 at home ~2L but starting to have increased O2  demands on 7/18 - suspect some atelectasis postop - continue aggressive IS use - wean O2 as able  - CXR on 7/19 shows atelectasis and some small effusions; wil d/c IVF and give dose of lasix. Continue encouraging PO intake   Malnutrition of moderate degree - Patient's BMI is Body mass index is 23.27 kg/m.. - Patient has the following signs/symptoms consistent with PCM: (fat loss, muscle loss). - seen by RD, appreciate assistance. Continue plan per RD to include MVI, Ensure   Chronic obstructive pulmonary disease (COPD) ([place]) - No signs or symptoms of exacerbation at this time -Continue albuterol as needed -Patient quit smoking about 3 weeks ago  Tobacco abuse - Nicotine patch as needed ordered for nicotine craving - Counseled patient that tobacco abuse also decreases wound healing   Old records reviewed in assessment of this patient  Antimicrobials: Rocephin 07/27/2021 >> 07/31/2021 Flagyl 07/27/2021 >> 08/01/2021  DVT prophylaxis:  heparin injection 5,000 Units Start: 07/28/21 0600 Place TED hose Start: 07/27/21 1714   Code Status:   Code Status: Full Code  Mobility Assessment (last 72 hours)     Mobility Assessment     Row Name 08/02/21 1217 08/02/21 1208 08/01/21 1500 08/01/21 1412 07/31/21 2010   Does patient have an order for bedrest or is patient medically unstable -- -- No - Continue assessment -- No - Continue assessment   What is the highest level of mobility based on the progressive mobility assessment? Level 4 (Walks with assist in room) - Balance while marching in place and cannot step forward and back - Complete Level 4 (Walks with assist in room) - Balance while marching in place and cannot step forward and back - Complete Level  4 (Walks with assist in room) - Balance while marching in place and cannot step forward and back - Complete Level 3 (Stands with assist) - Balance while standing  and cannot march in place Level 5 (Walks with assist in room/hall) - Balance  while stepping forward/back and can walk in room with assist - Complete            Disposition Plan:  Home with [place] Status is: Inpt  Objective: Blood pressure 112/63, pulse 100, temperature 98.9 F (37.2 C), resp. rate 18, height '5\' 2"'$  (1.575 m), weight 57.1 kg, SpO2 90 %.  Examination:  Physical Exam Constitutional:      Appearance: Normal appearance.  HENT:     Head: Normocephalic and atraumatic.     Mouth/Throat:     Mouth: Mucous membranes are moist.  Eyes:     Extraocular Movements: Extraocular movements intact.  Cardiovascular:     Rate and Rhythm: Normal rate and regular rhythm.  Abdominal:     General: There is no distension.     Palpations: Abdomen is soft.     Tenderness: There is abdominal tenderness.     Comments: Small amount of stool noted in ostomy bag; appropriately tender to palpation.  Honeycomb dressing now removed; staples remain in place.  Musculoskeletal:        General: No swelling.     Cervical back: Normal range of motion and neck supple.  Skin:    General: Skin is warm and dry.  Neurological:     General: No focal deficit present.     Mental Status: She is alert.  Psychiatric:        Mood and Affect: Mood normal.      Consultants:  General surgery   Procedures:  Sigmoid colon resection with anastomosis, mobilization of splenic flexure, loop ileostomy creation, 07/31/2021  Data Reviewed: No results found for this or any previous visit (from the past 24 hour(s)).  I have Reviewed nursing notes, Vitals, and Lab results since pt's last encounter. Pertinent lab results : see above I have ordered test including BMP, CBC, Mg I have reviewed the last note from staff over past 24 hours I have discussed pt's care plan and test results with nursing staff, case manager   LOS: 6 days   Dwyane Dee, MD [facility] 08/03/2021, 1:06 PM

## 2021-08-03 NOTE — Progress Notes (Signed)
Occupational Therapy Treatment Patient Details Name: Brianna Briggs MRN: [id number] DOB: 10-18-1950 Today's Date: 08/03/2021   History of present illness Pt is a 71 y/o F admitted on 07/27/21 after presenting with c/o nausea & percutaneous drainage. Pt was found to have persistent & increasing abscess in LLQ & intolerance to oral antibiotics. 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 PMH: CAD, dyslipidemia, solitary kidney & recurrent diverticulitis & diverticular abscess, recently admitted multiple times with lower abdominal pain   OT comments  Upon entering the room, pt supine in bed in a very uncomfortable position in bed. Pt reports, " I need to get up". Pt verbalized need for urination but refusal to attempt transfer to [facility] or ambulation to bathroom. Pt instead hold purewick up to her body to urinate before agreeing to attempt EOB. Min A for truck support to EOB. Pt reports not eating much today. She is able to sit EOB with supervision and requesting crackers and PB. OT provided them to pt and she remained seated on EOB for snack. RN notified and bed alarm activated for safety.    Recommendations for follow up therapy are one component of a multi-disciplinary discharge planning process, led by the attending physician.  Recommendations may be updated based on patient status, additional functional criteria and insurance authorization.    Follow Up Recommendations  Skilled nursing-short term rehab (<3 hours/day)    Assistance Recommended at Discharge Frequent or constant Supervision/Assistance  Patient can return home with the following  A little help with walking and/or transfers;A little help with bathing/dressing/bathroom;Help with stairs or ramp for entrance;Assist for transportation;Assistance with cooking/housework   Equipment Recommendations  Other (comment) (defer to next venue of care)       Precautions / Restrictions Precautions Precautions:  Fall Precaution Comments: new R side colostomy bag Restrictions Weight Bearing Restrictions: No       Mobility Bed Mobility Overal bed mobility: Modified Independent Bed Mobility: Supine to Sit Rolling: Min assist         General bed mobility comments: Min A for trunk support           Balance Overall balance assessment: Needs assistance Sitting-balance support: No upper extremity supported Sitting balance-Leahy Scale: Good Sitting balance - Comments: supervision for dynamic sitting                                   ADL either performed or assessed with clinical judgement   ADL Overall ADL's : Needs assistance/impaired                                       General ADL Comments: Pt refusing toilet transfer and self care tasks.    Extremity/Trunk Assessment Upper Extremity Assessment Upper Extremity Assessment: Generalized weakness   Lower Extremity Assessment Lower Extremity Assessment: Generalized weakness        Vision Patient Visual Report: No change from baseline            Cognition Arousal/Alertness: Awake/alert Behavior During Therapy: Flat affect Overall Cognitive Status: Within Functional Limits for tasks assessed  Pertinent Vitals/ Pain       Pain Assessment Pain Assessment: Faces Faces Pain Scale: Hurts little more Pain Location: R side of abdomen Pain Descriptors / Indicators: Discomfort, Grimacing Pain Intervention(s): Monitored during session, Premedicated before session, Repositioned         Frequency  Min 2X/week        Progress Toward Goals  OT Goals(current goals can now be found in the care plan section)  Progress towards OT goals: Progressing toward goals  Acute Rehab OT Goals Patient Stated Goal: to get better and go home OT Goal Formulation: With patient Time For Goal Achievement: 08/16/21 Potential to Achieve  Goals: Good  Plan Discharge plan remains appropriate;Frequency remains appropriate       AM-PAC OT "6 Clicks" Daily Activity     Outcome Measure   Help from another person eating meals?: A Little Help from another person taking care of personal grooming?: A Little Help from another person toileting, which includes using toliet, bedpan, or urinal?: A Little Help from another person bathing (including washing, rinsing, drying)?: A Little Help from another person to put on and taking off regular upper body clothing?: A Little Help from another person to put on and taking off regular lower body clothing?: A Little 6 Click Score: 18    End of Session Equipment Utilized During Treatment: Oxygen  OT Visit Diagnosis: Unsteadiness on feet (R26.81);Muscle weakness (generalized) (M62.81);Pain;History of falling (Z91.81) Pain - Right/Left: Right   Activity Tolerance Patient limited by fatigue   Patient Left in bed;with call bell/phone within reach;with bed alarm set   Nurse Communication Mobility status        Time: 0086-7619 OT Time Calculation (min): 20 min  Charges: OT General Charges $OT Visit: 1 Visit OT Treatments $Therapeutic Activity: 8-22 mins  Darleen Crocker, MS, OTR/L , CBIS ascom [phone]  08/03/21, 3:51 PM

## 2021-08-03 NOTE — Progress Notes (Signed)
Patient ID: Brianna Briggs, female   DOB: 08/05/1950, 71 y.o.   MRN: [id number]     [facility] Day(s): 6.   Interval History: Patient seen and examined, no acute events or new complaints overnight. Patient reports feeling good.  She endorses significant pain all over her body.  She has surgical pain but she also has back pain.  Denies any nausea or vomiting.  Colostomy working adequately.  I personally changed the colostomy wound today.  There is bulging on the superior portion of the colostomy but no redness or sign of infection.  Vital signs in last 24 hours: [min-max] current  Temp:  [98.6 F (37 C)-100.4 F (38 C)] 98.9 F (37.2 C) (07/19 0802) Pulse Rate:  [80-100] 100 (07/19 0802) Resp:  [16-18] 18 (07/19 0802) BP: (99-112)/(58-63) 112/63 (07/19 0802) SpO2:  [90 %-95 %] 90 % (07/19 0802) Weight:  [57.1 kg] 57.1 kg (07/19 0227)     Height: '5\' 2"'$  (157.5 cm) Weight: 57.1 kg BMI (Calculated): 23.02   Physical Exam:  Constitutional: alert, cooperative and no distress  Respiratory: breathing non-labored at rest  Cardiovascular: regular rate and sinus rhythm  Gastrointestinal: soft, non-tender, and non-distended.  Colostomy pink and patent.  There is significant swelling.  There is bulging the superior portion.  Skin is healthy without erythema or any sign of infection.  There is stool in the colostomy bag.  Labs:     Latest Ref Rng & Units 07/30/2021    7:15 AM 07/29/2021    4:25 AM 07/28/2021    5:42 AM  CBC  WBC 4.0 - 10.5 K/uL 6.4  2.9  6.7   Hemoglobin 12.0 - 15.0 g/dL 10.6  11.2  11.6   Hematocrit 36.0 - 46.0 % 33.0  35.8  36.5   Platelets 150 - 400 K/uL 177  199  197       Latest Ref Rng & Units 07/29/2021    4:25 AM 07/28/2021    5:42 AM 07/27/2021    1:55 PM  CMP  Glucose 70 - 99 mg/dL 111  88  113   BUN 8 - 23 mg/dL 9  <5  6   Creatinine 0.44 - 1.00 mg/dL 0.66  0.63  0.65   Sodium 135 - 145 mmol/L 138  139  138   Potassium 3.5 - 5.1 mmol/L  4.3  3.3  3.9   Chloride 98 - 111 mmol/L 102  99  91   CO2 22 - 32 mmol/L 31  33  37   Calcium 8.9 - 10.3 mg/dL 8.4  8.3  9.1   Total Protein 6.5 - 8.1 g/dL   7.6   Total Bilirubin 0.3 - 1.2 mg/dL   0.9   Alkaline Phos 38 - 126 U/L   60   AST 15 - 41 U/L   23   ALT 0 - 44 U/L   10     Imaging studies: No new pertinent imaging studies   Assessment/Plan:  71 y.o. female with complaint of diverticulitis 3 Day Post-Op s/p partial colectomy with anastomosis and loop ileostomy creation.   -Adequate output in ileostomy bag this morning -We will keep him in soft diet -Still with significant pain.  There is a component of surgical pain in addition to chronic back and multiple body pains. -Patient is still severely debilitated.  She needs aggressive physical therapy and pain management. -I personally changed the ileostomy bag today.  -Encourage the patient to get  out of bed -We will continue with pain management --Appreciate hospitalist medical management of her medical comorbidities.  Arnold Long, MD

## 2021-08-03 NOTE — Progress Notes (Signed)
   08/03/21 1800  Clinical Encounter Type  Visited With Patient  Visit Type Initial  Referral From Care management  Consult/Referral To Chaplain   Chaplain responded to request from Case Management to visit patient. Support was given through meaningful conversation and reflective listening. Patient expressed fears of going to SNF.

## 2021-08-03 NOTE — Progress Notes (Signed)
Physical Therapy Treatment Patient Details Name: Brianna Briggs MRN: [id number] DOB: 02-09-1950 Today's Date: 08/03/2021   History of Present Illness Pt is a 71 y/o F admitted on 07/27/21 after presenting with c/o nausea & percutaneous drainage. Pt was found to have persistent & increasing abscess in LLQ & intolerance to oral antibiotics. 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 PMH: CAD, dyslipidemia, solitary kidney & recurrent diverticulitis & diverticular abscess, recently admitted multiple times with lower abdominal pain    PT Comments    Treatment willing to participate but treatment limited due to pt reporting feeling 'loopy' from pain meds, stating she was distracted due to frequent urination, and being incontinent (pt on diuretics).  Pt did progress with bed mobility demonstrating supine<> sit and scooting EOB (forward and laterally) at supervision with increased time needed.  Current d/c plan is still appropriate.  Recommendations for follow up therapy are one component of a multi-disciplinary discharge planning process, led by the attending physician.  Recommendations may be updated based on patient status, additional functional criteria and insurance authorization.  Follow Up Recommendations  Skilled nursing-short term rehab (<3 hours/day) Can patient physically be transported by private vehicle: No   Assistance Recommended at Discharge Frequent or constant Supervision/Assistance  Patient can return home with the following A lot of help with bathing/dressing/bathroom;Assistance with cooking/housework;Assist for transportation;Help with stairs or ramp for entrance;A lot of help with walking and/or transfers   Equipment Recommendations  Rolling walker (2 wheels);BSC/3in1    Recommendations for Other Services       Precautions / Restrictions Precautions Precautions: Fall Precaution Comments: new R side colostomy bag Restrictions Weight Bearing  Restrictions: No     Mobility  Bed Mobility Overal bed mobility: Modified Independent Bed Mobility: Supine to Sit, Rolling Rolling: Supervision   Supine to sit: Supervision     General bed mobility comments: Pt able to perform scotting EOB and trunk control at supervision level. Patient Response: Cooperative  Transfers Overall transfer level: Needs assistance Equipment used: Rolling walker (2 wheels) Transfers: Sit to/from Stand Sit to Stand: Min guard                Ambulation/Gait               General Gait Details: Attempted but pt urinating so frequently and incontinent of bladder unable at this session.   Stairs             Wheelchair Mobility    Modified Rankin (Stroke Patients Only)       Balance Overall balance assessment: Needs assistance Sitting-balance support: No upper extremity supported (with in BOS) Sitting balance-Leahy Scale: Good Sitting balance - Comments: supervision for dynamic sitting   Standing balance support: Bilateral upper extremity supported, During functional activity Standing balance-Leahy Scale: Fair                 High Level Balance Comments: Pt required min guard for static standing. Pt stood for 3 min when being cleaned up and pt reported fatigue and needing seated rest.            Cognition Arousal/Alertness: Awake/alert Behavior During Therapy: Flat affect Overall Cognitive Status: Within Functional Limits for tasks assessed                                 General Comments: Pt just generally not feeling well this session. Reports feeling "loopy"after pain  meds. Pt had almost constant urination during session and was distracted by that throughout session; RN aware.        Exercises Other Exercises Other Exercises: ankle Pumps and spirometer x5 working on deep breathing and pursed lip breathing.    General Comments        Pertinent Vitals/Pain Pain Assessment Pain  Assessment: Faces Faces Pain Scale: Hurts a little bit Pain Location: R side of abdomen Pain Descriptors / Indicators: Discomfort, Grimacing Pain Intervention(s): Monitored during session, Premedicated before session    Home Living                          Prior Function            PT Goals (current goals can now be found in the care plan section) Acute Rehab PT Goals Patient Stated Goal: decreased pain, get better PT Goal Formulation: With patient Time For Goal Achievement: 08/15/21 Potential to Achieve Goals: Fair Progress towards PT goals: Progressing toward goals    Frequency    Min 2X/week      PT Plan Current plan remains appropriate    Co-evaluation              AM-PAC PT "6 Clicks" Mobility   Outcome Measure  Help needed turning from your back to your side while in a flat bed without using bedrails?: A Little Help needed moving from lying on your back to sitting on the side of a flat bed without using bedrails?: A Little Help needed moving to and from a bed to a chair (including a wheelchair)?: A Little Help needed standing up from a chair using your arms (e.g., wheelchair or bedside chair)?: A Little Help needed to walk in hospital room?: A Little Help needed climbing 3-5 steps with a railing? : Total 6 Click Score: 16    End of Session Equipment Utilized During Treatment: Oxygen;Gait belt Activity Tolerance: Other (comment) (limited due to frequent urination and incontinence) Patient left: with nursing/sitter in room Nurse Communication: Mobility status PT Visit Diagnosis: Unsteadiness on feet (R26.81);Muscle weakness (generalized) (M62.81);Difficulty in walking, not elsewhere classified (R26.2);Pain Pain - Right/Left: Right     Time: 4332-9518 PT Time Calculation (min) (ACUTE ONLY): 48 min  Charges:  $Neuromuscular Re-education: 38-52 mins                     Bjorn Loser, PTA  08/03/21, 1:19 PM

## 2021-08-04 DIAGNOSIS — R1012 Left upper quadrant pain: Secondary | ICD-10-CM | POA: Diagnosis not present

## 2021-08-04 DIAGNOSIS — A419 Sepsis, unspecified organism: Secondary | ICD-10-CM | POA: Diagnosis not present

## 2021-08-04 DIAGNOSIS — J9621 Acute and chronic respiratory failure with hypoxia: Secondary | ICD-10-CM | POA: Diagnosis not present

## 2021-08-04 LAB — SARS CORONAVIRUS 2 BY RT PCR: SARS Coronavirus 2 by RT PCR: POSITIVE — AB

## 2021-08-04 MED ORDER — SODIUM CHLORIDE 0.9 % IV SOLN
100.0000 mg | Freq: Every day | INTRAVENOUS | Status: AC
  Administered 2021-08-05 – 2021-08-06 (×2): 100 mg via INTRAVENOUS
  Filled 2021-08-04: qty 20
  Filled 2021-08-04: qty 100

## 2021-08-04 MED ORDER — ACETAMINOPHEN 325 MG PO TABS
650.0000 mg | ORAL_TABLET | Freq: Once | ORAL | Status: AC
  Administered 2021-08-04: 650 mg via ORAL
  Filled 2021-08-04: qty 2

## 2021-08-04 MED ORDER — METHYLPREDNISOLONE SODIUM SUCC 40 MG IJ SOLR
40.0000 mg | Freq: Two times a day (BID) | INTRAMUSCULAR | Status: DC
  Administered 2021-08-04 – 2021-08-10 (×12): 40 mg via INTRAVENOUS
  Filled 2021-08-04 (×12): qty 1

## 2021-08-04 MED ORDER — SODIUM CHLORIDE 0.9 % IV SOLN
200.0000 mg | Freq: Once | INTRAVENOUS | Status: AC
  Administered 2021-08-04: 200 mg via INTRAVENOUS
  Filled 2021-08-04: qty 40

## 2021-08-04 NOTE — Progress Notes (Signed)
Progress Note    Brianna Briggs   HCW:[id number]  DOB: 12-30-1950  DOA: 07/27/2021     7 PCP: Kirk Ruths, MD  Initial CC: abdominal pain  Hospital Course: Brianna Briggs is a 71 yo female with PMH 71 year old with history of CAD, HLD, solitary kidney, recurrent diverticulitis with abscess who presented with epigastric pain.  She had been on outpatient antibiotics with minimal improvement. CT abdomen/pelvis on admission showed persistent abscess attributed to acute sigmoid diverticulitis. She had initially been treated with percutaneous drainage and IV antibiotics as well. She was followed by general surgery and ultimately underwent sigmoid resection with anastomosis and loop ileostomy creation.  Interval History:  No events overnight. Sitting on edge of bed eating cereal this morning. Feels a little better than yesterday but still lethargic.   Assessment and Plan: * Sepsis due to diverticulitis with abscess (HCC)-resolved as of 08/04/2021 - Patient meets sepsis criteria with increased heart rate, leukocytosis, source of intra-abdominal abscess -S/p treatment with percutaneous drainage and prior IV antibiotic course - Now has undergone sigmoid resection with loop ileostomy creation - Antibiotics have now been discontinued  Diverticulitis of intestine with abscess - Complicated diverticulitis with abscess - now s/p sigmoid resection with loop ileostomy creation on 07/31/2021 -Diet has been advanced to soft diet per surgery -Continue monitoring oral intake - Continue nausea control - Continue monitoring ileostomy output  Abdominal pain - Patient still complaining of ongoing pain s/p surgery - She is hesitant for trying oxycodone - Continue Dilaudid for breakthrough - Continue Vicodin  Acute on chronic respiratory failure with hypoxia (HCC) - recently started on nocturnal O2 at home ~2L but starting to have increased O2 demands on 7/18 - suspect some atelectasis  postop - continue aggressive IS use - wean O2 as able  - CXR on 7/19 shows atelectasis and some small effusions; s/p lasix x 1 on 7/19; Continue encouraging PO intake   Malnutrition of moderate degree - Patient's BMI is Body mass index is 23.27 kg/m.. - Patient has the following signs/symptoms consistent with PCM: (fat loss, muscle loss). - seen by RD, appreciate assistance. Continue plan per RD to include MVI, Ensure   Chronic obstructive pulmonary disease (COPD) (HCC) - No signs or symptoms of exacerbation at this time -Continue albuterol as needed -Patient quit smoking about 3 weeks ago  Tobacco abuse - Nicotine patch as needed ordered for nicotine craving - Counseled patient that tobacco abuse also decreases wound healing   Old records reviewed in assessment of this patient  Antimicrobials: Rocephin 07/27/2021 >> 07/31/2021 Flagyl 07/27/2021 >> 08/01/2021  DVT prophylaxis:  heparin injection 5,000 Units Start: 07/28/21 0600 Place TED hose Start: 07/27/21 1714   Code Status:   Code Status: Full Code  Mobility Assessment (last 72 hours)     Mobility Assessment     Row Name 08/03/21 1549 08/03/21 1308 08/02/21 1217 08/02/21 1208 08/01/21 1500   Does patient have an order for bedrest or is patient medically unstable -- -- -- -- No - Continue assessment   What is the highest level of mobility based on the progressive mobility assessment? Level 4 (Walks with assist in room) - Balance while marching in place and cannot step forward and back - Complete Level 4 (Walks with assist in room) - Balance while marching in place and cannot step forward and back - Complete Level 4 (Walks with assist in room) - Balance while marching in place and cannot step forward and back - Complete Level 4 (  Walks with assist in room) - Balance while marching in place and cannot step forward and back - Complete Level 4 (Walks with assist in room) - Balance while marching in place and cannot step forward and  back - Complete    Row Name 08/01/21 1412           What is the highest level of mobility based on the progressive mobility assessment? Level 3 (Stands with assist) - Balance while standing  and cannot march in place                Disposition Plan:  SNF Status is: Inpt  Objective: Blood pressure (!) 97/56, pulse 71, temperature 97.7 F (36.5 C), resp. rate 17, height '5\' 2"'$  (1.575 m), weight 54.6 kg, SpO2 94 %.  Examination:  Physical Exam Constitutional:      Appearance: Normal appearance.  HENT:     Head: Normocephalic and atraumatic.     Mouth/Throat:     Mouth: Mucous membranes are moist.  Eyes:     Extraocular Movements: Extraocular movements intact.  Cardiovascular:     Rate and Rhythm: Normal rate and regular rhythm.  Abdominal:     General: There is no distension.     Palpations: Abdomen is soft.     Tenderness: There is abdominal tenderness.     Comments: Small amount of stool noted in ostomy bag; appropriately tender to palpation.  Honeycomb dressing now removed; staples remain in place.  Musculoskeletal:        General: No swelling.     Cervical back: Normal range of motion and neck supple.  Skin:    General: Skin is warm and dry.  Neurological:     General: No focal deficit present.     Mental Status: She is alert.  Psychiatric:        Mood and Affect: Mood normal.      Consultants:  General surgery   Procedures:  Sigmoid colon resection with anastomosis, mobilization of splenic flexure, loop ileostomy creation, 07/31/2021  Data Reviewed: No results found for this or any previous visit (from the past 24 hour(s)).  I have Reviewed nursing notes, Vitals, and Lab results since pt's last encounter. Pertinent lab results : see above I have ordered test including BMP, CBC, Mg I have reviewed the last note from staff over past 24 hours I have discussed pt's care plan and test results with nursing staff, case manager   LOS: 7 days   Dwyane Dee,  MD [facility] 08/04/2021, 2:10 PM

## 2021-08-04 NOTE — Consult Note (Signed)
[place] Nurse ostomy follow up Patient receiving care in [facility]. RUQ ileostomy pouch changed by surgeon yesterday. Did not need changing today. Patient observed how I emptied the pouch and cleaned the opening, but she would not participate in performing any care activities. Today her back is hurting too much. I requested the [place] to order 5 of each of the following supplies: barrier ring, Kellie Simmering [phone]; skin barrier, Kellie Simmering #644; and pouch, Kellie Simmering #234. I also requested the [place] have the supplies placed in the patient's room.  The patient expressed concern about how a SNF will be paid for, how she will get to/from doctor's appointments when at a SNF, how long she would stay, etc. I explained I would send a message to the Case Manager and ask her to come speak with her about her questions.  She did allow me to enroll her in [employer] through [employer]. I placed the enrollment form and confirmation sheet in her Education folder and explained she needs to make sure the folder and all ostomy supplies in her room go with her when she is discharged.  Val Riles, RN, MSN, CWOCN, CNS-BC, pager [phone]

## 2021-08-04 NOTE — Progress Notes (Signed)
Patient ID: Brianna Briggs, female   DOB: 1950/02/22, 71 y.o.   MRN: [id number]     [facility] Hospital Day(s): 7.   Interval History: Patient seen and examined, no acute events or new complaints overnight. Patient reports continue having back pain. No nausea or vomiting. Tolerating diet.   Vital signs in last 24 hours: [min-max] current  Temp:  [97.7 F (36.5 C)-99.3 F (37.4 C)] 97.7 F (36.5 C) (07/20 0759) Pulse Rate:  [71-100] 71 (07/20 0759) Resp:  [16-18] 17 (07/20 0759) BP: (97-103)/(56-76) 97/56 (07/20 0759) SpO2:  [91 %-95 %] 94 % (07/20 0759) Weight:  [54.6 kg] 54.6 kg (07/20 0500)     Height: '5\' 2"'$  (157.5 cm) Weight: 54.6 kg (Simultaneous filing. User may not have seen previous data.) BMI (Calculated): 22.01   Physical Exam:  Constitutional: alert, cooperative and no distress  Respiratory: breathing non-labored at rest  Cardiovascular: regular rate and sinus rhythm  Gastrointestinal: soft, non-tender, and non-distended. Ileostomy pink and patent.   Labs:     Latest Ref Rng & Units 07/30/2021    7:15 AM 07/29/2021    4:25 AM 07/28/2021    5:42 AM  CBC  WBC 4.0 - 10.5 K/uL 6.4  2.9  6.7   Hemoglobin 12.0 - 15.0 g/dL 10.6  11.2  11.6   Hematocrit 36.0 - 46.0 % 33.0  35.8  36.5   Platelets 150 - 400 K/uL 177  199  197       Latest Ref Rng & Units 07/29/2021    4:25 AM 07/28/2021    5:42 AM 07/27/2021    1:55 PM  CMP  Glucose 70 - 99 mg/dL 111  88  113   BUN 8 - 23 mg/dL 9  <5  6   Creatinine 0.44 - 1.00 mg/dL 0.66  0.63  0.65   Sodium 135 - 145 mmol/L 138  139  138   Potassium 3.5 - 5.1 mmol/L 4.3  3.3  3.9   Chloride 98 - 111 mmol/L 102  99  91   CO2 22 - 32 mmol/L 31  33  37   Calcium 8.9 - 10.3 mg/dL 8.4  8.3  9.1   Total Protein 6.5 - 8.1 g/dL   7.6   Total Bilirubin 0.3 - 1.2 mg/dL   0.9   Alkaline Phos 38 - 126 U/L   60   AST 15 - 41 U/L   23   ALT 0 - 44 U/L   10     Imaging studies: No new pertinent imaging  studies   Assessment/Plan:  71 y.o. female with complaint of diverticulitis 4 Day Post-Op s/p partial colectomy with anastomosis and loop ileostomy creation.   -Adequate output in ileostomy bag this morning -Tolerating soft diet well. -Still with significant pain.  There is a component of surgical pain in addition to chronic back and multiple body pains. -Patient is still severely debilitated.  She needs aggressive physical therapy and pain management. -Encourage the patient to get out of bed -We will continue with pain management --Appreciate hospitalist medical management of her medical comorbidities.  Arnold Long, MD

## 2021-08-04 NOTE — TOC Progression Note (Addendum)
Transition of Care [facility]) - Progression Note    Patient Details  Name: Brianna Briggs MRN: [id number] Date of Birth: May 26, 1950  Transition of Care [facility]) CM/SW [place], RN Phone Number: 08/04/2021, 4:03 PM  Clinical Narrative:   Patient was tearful on conversation with RNCM.  She states she is not feeling well and is very concerned about her husband and daughter.  Both of whom have COVID.  Patient is currently undergoing testing here at [facility].  If patient tests positive, discharge to SNF will be delayed until day 11 after positive test.  However results are not yet back from todays test, care team is continuing to monitor.  Patient states she remain unsure about SNF, but chooses [facility] at this time.  During this conversation, she struggled with her decision, but she informed RNCM That she does not want to discuss this decision with her family at this time.  [facility] made aware of [facility] decision for [facility].  Care team made aware that patient states she is not feeling well at this time and would like staff to see her.  Addendum 1608: RNCM contacted chaplian for supportive listening as patient was upset.  As per Chaplain Amy, either she or a colleage will be up to see patient.  Expected Discharge Plan:  (TBD) Barriers to Discharge: Continued Medical Work up  Expected Discharge Plan and Services Expected Discharge Plan:  (TBD)   Discharge Planning Services: CM Consult   Living arrangements for the past 2 months: Single Family Home                                       Social Determinants of Health (SDOH) Interventions    Readmission Risk Interventions    07/28/2021    4:14 PM  Readmission Risk Prevention Plan  Transportation Screening Complete  PCP or Specialist Appt within 5-7 Days Complete  Home Care Screening Complete  Medication Review (RN CM) Complete

## 2021-08-04 NOTE — Progress Notes (Signed)
Nutrition Follow-up  DOCUMENTATION CODES:   Non-severe (moderate) malnutrition in context of chronic illness  INTERVENTION:   -Continue MVI with minerals daily -[place] Instant Breakfast with meals  NUTRITION DIAGNOSIS:   Moderate Malnutrition related to chronic illness, catabolic illness (tobacco use, COPD, SVT, diverticulitis) as evidenced by moderate fat depletion, moderate muscle depletion, energy intake < 75% for > or equal to 1 month.  Ongoing  GOAL:   Patient will meet greater than or equal to 90% of their needs  Progressing  MONITOR:   Diet advancement, Labs, Weight trends, Skin, I & O's  REASON FOR ASSESSMENT:   Malnutrition Screening Tool    ASSESSMENT:   71 y/o female with h/o COPD, HLD, breast cancer s/p mastectomy, CAD and diverticulitis s/p IR drain (may) who is admitted with recurrent diverticulitis with abscess.  7/16- s/p Procedure: Sigmoid colon resection with anastomosis                      Mobilization of splenic flexure                      ileostomy creation 7/17- diet advanced to full liquids 7/18- diet advanced to soft  Reviewed I/O's: -1.7 L x 24 hours and +3.6 L since admission  UOP:1 .7 L x 24 hours  Spoke with pt at bedside, who reports she her appetite is slowly improving. She ate a few bites of cereal this morning. Pt complains that she often is interrupted at meals and her food is cold by the time she has the chance to eat it. Noted meal completions 0-100%.   Pt does not like Boost or Ensure supplements. She is amenable to try [employer], as she has had this in the past. Discussed importance of good meal and supplement intake to promote healing.   Labs reviewed: CBGS: 88.    Diet Order:   Diet Order             DIET SOFT Room service appropriate? Yes; Fluid consistency: Thin  Diet effective now                   EDUCATION NEEDS:   Education needs have been addressed  Skin:  Skin Assessment: Skin  Integrity Issues: Skin Integrity Issues:: Incisions Incisions: closed abdomen  Last BM:  08/03/21 (via colostomy)  Height:   Ht Readings from Last 1 Encounters:  07/27/21 '5\' 2"'$  (1.575 m)    Weight:   Wt Readings from Last 1 Encounters:  08/04/21 54.6 kg   BMI:  Body mass index is 22.02 kg/m.  Estimated Nutritional Needs:   Kcal:  6606-0045  Protein:  100-115 grams  Fluid:  > 1.7 L    Loistine Chance, RD, LDN, [place] Registered Dietitian II Certified Diabetes Care and Education Specialist Please refer to [facility] for RD and/or RD on-call/weekend/after hours pager

## 2021-08-05 DIAGNOSIS — U071 COVID-19: Secondary | ICD-10-CM | POA: Diagnosis not present

## 2021-08-05 DIAGNOSIS — A419 Sepsis, unspecified organism: Secondary | ICD-10-CM | POA: Diagnosis not present

## 2021-08-05 DIAGNOSIS — F329 Major depressive disorder, single episode, unspecified: Secondary | ICD-10-CM

## 2021-08-05 DIAGNOSIS — J9621 Acute and chronic respiratory failure with hypoxia: Secondary | ICD-10-CM | POA: Diagnosis not present

## 2021-08-05 LAB — CBC
HCT: 35.9 % — ABNORMAL LOW (ref 36.0–46.0)
Hemoglobin: 13.4 g/dL (ref 12.0–15.0)
MCH: 33.5 pg (ref 26.0–34.0)
MCHC: 37.3 g/dL — ABNORMAL HIGH (ref 30.0–36.0)
MCV: 89.8 fL (ref 80.0–100.0)
Platelets: 182 10*3/uL (ref 150–400)
RBC: 4 MIL/uL (ref 3.87–5.11)
RDW: 14.5 % (ref 11.5–15.5)
WBC: 3 10*3/uL — ABNORMAL LOW (ref 4.0–10.5)
nRBC: 0 % (ref 0.0–0.2)

## 2021-08-05 MED ORDER — GUAIFENESIN-DM 100-10 MG/5ML PO SYRP
10.0000 mL | ORAL_SOLUTION | ORAL | Status: DC | PRN
  Administered 2021-08-05 – 2021-08-16 (×12): 10 mL via ORAL
  Filled 2021-08-05 (×12): qty 10

## 2021-08-05 NOTE — Consult Note (Signed)
[place] Nurse ostomy follow up Stoma type/location: RLQ, loop ileostomy  Stomal assessment/size: 1 1/2" round, budded, plastic catheter sewn in place as support bridge Peristomal assessment: intact  Treatment options for stomal/peristomal skin: 2" skin barrier Output liquid brown; 100CC Ostomy pouching: 2pc. 2 1/4 with 2" skin barrier ring Education provided:  Demonstrated pouch change to patient, she states she is not learning right now she has "been through too much today" She was able to verbalize cleaning the spout to me today and rationale for this and she does verbalize how to close lock and roll closure.  That was about all she was willing to talk about today. Encouraged her to begin assisting with emptying and cleaning spout. Orders written for bedside nurse to support this effort as well.  Enrolled patient in [place] Start Discharge program: Yes  [place] Nurse will follow along with you for continued support with ostomy teaching and care [place] MSN, [place], [place], [place], [place]

## 2021-08-05 NOTE — Assessment & Plan Note (Addendum)
Pt no longer takes Crestor, per preference. Discuss resume statin at follow up.

## 2021-08-05 NOTE — Assessment & Plan Note (Addendum)
Patient's husband recently diagnosed after visiting her. Pt tested positive on 7/20. Completed course of Redmesivir and Steroids 7/31 -- taken off isolation precautions (10 days) - mucinex PRN - incentive spirometry & flutter

## 2021-08-05 NOTE — Progress Notes (Signed)
Progress Note    Brianna Briggs   OJJ:[id number]  DOB: 06-Jan-1951  DOA: 07/27/2021     8 PCP: Kirk Ruths, MD  Initial CC: abdominal pain  Hospital Course: [name] is a 71 yo female with PMH 71 year old with history of CAD, HLD, solitary kidney, recurrent diverticulitis with abscess who presented with epigastric pain.  She had been on outpatient antibiotics with minimal improvement. CT abdomen/pelvis on admission showed persistent abscess attributed to acute sigmoid diverticulitis. She had initially been treated with percutaneous drainage and IV antibiotics as well. She was followed by general surgery and ultimately underwent sigmoid resection with anastomosis and loop ileostomy creation. She had persistent lethargy and poor appetite after surgery.  She had learned that her husband was diagnosed with COVID and had also been visiting her in the hospital.  She underwent COVID-19 testing on 08/04/2021 which was also positive.  She was started on remdesivir and steroids given her worsening oxygen requirement from her baseline as well.  Interval History:  No events overnight.  Reviewed findings of COVID testing which she of course already knew.  Encouraged her to continue eating as best she can and using spirometer. She does feel a little bit better today in general. Also had a long talk about her history of depression and how she feels about possibly starting medication.  After conversation, she was not interested in starting any medications yet at this time but understands that she has the option to ask for it at any time.  Assessment and Plan: * Sepsis due to diverticulitis with abscess (HCC)-resolved as of 08/04/2021 - Patient meets sepsis criteria with increased heart rate, leukocytosis, source of intra-abdominal abscess -S/p treatment with percutaneous drainage and prior IV antibiotic course - Now has undergone sigmoid resection with loop ileostomy creation - Antibiotics  have now been discontinued  Diverticulitis of intestine with abscess - Complicated diverticulitis with abscess - now s/p sigmoid resection with loop ileostomy creation on 07/31/2021 -Diet has been advanced to soft diet per surgery -Continue monitoring oral intake; needs improvement - Continue nausea control - Continue monitoring ileostomy output; adequate  COVID-19 virus infection - patient endorsed husband recently diagnosed after visiting her - patient tested on 7/20 and found to be positive; possibly explains her persistent lethargy, poor appetite, slightly increased O2 demands - started Remdesivir 7/20 x 3 days - continue steroids given acute on chronic worsened hypoxia  - continue mucinex and spirometer use - trending inflammatory markers   Abdominal pain - Patient still complaining of ongoing pain s/p surgery - She is hesitant for trying oxycodone - Continue Dilaudid for breakthrough - Continue Vicodin  Acute on chronic respiratory failure with hypoxia ([place]) - recently started on nocturnal O2 at home ~2L but starting to have increased O2 demands on 7/18 - suspect some atelectasis postop - continue aggressive IS use - CXR on 7/19 shows atelectasis and some small effusions; s/p lasix x 1 on 7/19; Continue encouraging PO intake  - on 7/20, covid-19 positive so some possible contribution as well (see treatment)  MDD (major depressive disorder) - Spoke with patient at length bedside this morning.  She does have a history of depression and has been treated with medications in the past.  She could not recall which names but thinks 1 was Wellbutrin as she was also on it to help with smoking cessation. - specifically denies SI/HI - situationally she has lots of stressors in life that are persistent and she does meet some criteria to  consider treatment (endorses difficulty with sleep, decreased energy, decreased appetite); given her prior percutaneous drain, this was also playing a role  in some of her depression and also decreased ability to eat much and therefore had less energy.  After discussion, she is declining starting on medication at this time but understands if she changes her mind, this would be considered appropriate and reasonable  Malnutrition of moderate degree - Patient's BMI is Body mass index is 23.27 kg/m.. - Patient has the following signs/symptoms consistent with PCM: (fat loss, muscle loss). - seen by RD, appreciate assistance. Continue plan per RD to include MVI, Ensure  Chronic obstructive pulmonary disease (COPD) (HCC) - No signs or symptoms of exacerbation at this time -Continue albuterol as needed -Patient quit smoking about 3 weeks ago  Tobacco abuse - Nicotine patch as needed ordered for nicotine craving - Counseled patient that tobacco abuse also decreases wound healing  Hyperlipidemia, mixed - okay to hold statin for now    Old records reviewed in assessment of this patient  Antimicrobials: Rocephin 07/27/2021 >> 07/31/2021 Flagyl 07/27/2021 >> 08/01/2021 Remdesivir 08/04/2021 >> current   DVT prophylaxis:  heparin injection 5,000 Units Start: 07/28/21 0600 Place TED hose Start: 07/27/21 1714   Code Status:   Code Status: Full Code  Mobility Assessment (last 72 hours)     Mobility Assessment     Row Name 08/05/21 1400 08/05/21 0940 08/03/21 1549 08/03/21 1308     Does patient have an order for bedrest or is patient medically unstable No - Continue assessment No - Continue assessment -- --    What is the highest level of mobility based on the progressive mobility assessment? -- -- Level 4 (Walks with assist in room) - Balance while marching in place and cannot step forward and back - Complete Level 4 (Walks with assist in room) - Balance while marching in place and cannot step forward and back - Complete             Disposition Plan:  SNF Status is: Inpt  Objective: Blood pressure 97/63, pulse 84, temperature (!) 97.4 F  (36.3 C), resp. rate 17, height '5\' 2"'$  (1.575 m), weight 54.6 kg, SpO2 92 %.  Examination:  Physical Exam Constitutional:      Appearance: Normal appearance.  HENT:     Head: Normocephalic and atraumatic.     Mouth/Throat:     Mouth: Mucous membranes are moist.  Eyes:     Extraocular Movements: Extraocular movements intact.  Cardiovascular:     Rate and Rhythm: Normal rate and regular rhythm.  Abdominal:     General: There is no distension.     Palpations: Abdomen is soft.     Comments: Small amount of stool noted in ostomy bag; appropriately tender to palpation.  Honeycomb dressing now removed; staples remain in place.  Musculoskeletal:        General: No swelling.     Cervical back: Normal range of motion and neck supple.  Skin:    General: Skin is warm and dry.  Neurological:     General: No focal deficit present.     Mental Status: She is alert.  Psychiatric:        Mood and Affect: Mood normal.      Consultants:  General surgery   Procedures:  Sigmoid colon resection with anastomosis, mobilization of splenic flexure, loop ileostomy creation, 07/31/2021  Data Reviewed: Results for orders placed or performed during the hospital encounter of 07/27/21 (from the  past 24 hour(s))  CBC     Status: Abnormal   Collection Time: 08/05/21  6:10 AM  Result Value Ref Range   WBC 3.0 (L) 4.0 - 10.5 K/uL   RBC 4.00 3.87 - 5.11 MIL/uL   Hemoglobin 13.4 12.0 - 15.0 g/dL   HCT 35.9 (L) 36.0 - 46.0 %   MCV 89.8 80.0 - 100.0 fL   MCH 33.5 26.0 - 34.0 pg   MCHC 37.3 (H) 30.0 - 36.0 g/dL   RDW 14.5 11.5 - 15.5 %   Platelets 182 150 - 400 K/uL   nRBC 0.0 0.0 - 0.2 %    I have Reviewed nursing notes, Vitals, and Lab results since pt's last encounter. Pertinent lab results : see above I have ordered test including BMP, CBC, Mg I have reviewed the last note from staff over past 24 hours I have discussed pt's care plan and test results with nursing staff, case manager   LOS: 8 days    Dwyane Dee, MD [facility] 08/05/2021, 2:56 PM

## 2021-08-05 NOTE — Assessment & Plan Note (Addendum)
Pt reports history of depression, treated with medications in the past but had negative side effects.   --Consider Remeron if patient agreeable - also would help with sleep and appetite --Discussed during admission with patient on a few separate occassions, she declines to start medication at time time. --PCP follow up

## 2021-08-05 NOTE — TOC Progression Note (Signed)
Transition of Care [facility]) - Progression Note    Patient Details  Name: Corley Kohls MRN: [id number] Date of Birth: Jun 14, 1950  Transition of Care [facility]) CM/SW [place], LCSW Phone Number: 08/05/2021, 8:57 AM  Clinical Narrative:    Notified Tammy at [facility] that patient's COVID test on 7/20 was positive. Tammy stated patient would not be able to come to [facility] until 10 days after positive test, 7/30.    Expected Discharge Plan:  (TBD) Barriers to Discharge: Continued Medical Work up  Expected Discharge Plan and Services Expected Discharge Plan:  (TBD)   Discharge Planning Services: CM Consult   Living arrangements for the past 2 months: Single Family Home                                       Social Determinants of Health (SDOH) Interventions    Readmission Risk Interventions    07/28/2021    4:14 PM  Readmission Risk Prevention Plan  Transportation Screening Complete  PCP or Specialist Appt within 5-7 Days Complete  Home Care Screening Complete  Medication Review (RN CM) Complete

## 2021-08-05 NOTE — Plan of Care (Signed)

## 2021-08-05 NOTE — Progress Notes (Signed)
Patient ID: Brianna Briggs, female   DOB: 21-Feb-1950, 71 y.o.   MRN: [id number]     [place] Hospital Day(s): 8.   Interval History: Patient seen and examined, no acute events or new complaints overnight. Patient reports started to have more cough today.  Endorses back pain.  No abdominal pain.  Tolerating diet.  No nausea or vomiting.  Vital signs in last 24 hours: [min-max] current  Temp:  [97.4 F (36.3 C)-100.6 F (38.1 C)] 97.4 F (36.3 C) (07/21 0904) Pulse Rate:  [72-100] 84 (07/21 0904) Resp:  [17-18] 17 (07/21 0904) BP: (97-115)/(63-88) 97/63 (07/21 0904) SpO2:  [88 %-92 %] 92 % (07/21 0904)     Height: '5\' 2"'$  (157.5 cm) Weight: 54.6 kg (Simultaneous filing. User may not have seen previous data.) BMI (Calculated): 22.01   Physical Exam:  Constitutional: alert, cooperative and no distress  Respiratory: breathing non-labored at rest  Cardiovascular: regular rate and sinus rhythm  Gastrointestinal: soft, non-tender, and non-distended.  Ileostomy pink and patent.  Labs:     Latest Ref Rng & Units 08/05/2021    6:10 AM 07/30/2021    7:15 AM 07/29/2021    4:25 AM  CBC  WBC 4.0 - 10.5 K/uL 3.0  6.4  2.9   Hemoglobin 12.0 - 15.0 g/dL 13.4  10.6  11.2   Hematocrit 36.0 - 46.0 % 35.9  33.0  35.8   Platelets 150 - 400 K/uL 182  177  199       Latest Ref Rng & Units 07/29/2021    4:25 AM 07/28/2021    5:42 AM 07/27/2021    1:55 PM  CMP  Glucose 70 - 99 mg/dL 111  88  113   BUN 8 - 23 mg/dL 9  <5  6   Creatinine 0.44 - 1.00 mg/dL 0.66  0.63  0.65   Sodium 135 - 145 mmol/L 138  139  138   Potassium 3.5 - 5.1 mmol/L 4.3  3.3  3.9   Chloride 98 - 111 mmol/L 102  99  91   CO2 22 - 32 mmol/L 31  33  37   Calcium 8.9 - 10.3 mg/dL 8.4  8.3  9.1   Total Protein 6.5 - 8.1 g/dL   7.6   Total Bilirubin 0.3 - 1.2 mg/dL   0.9   Alkaline Phos 38 - 126 U/L   60   AST 15 - 41 U/L   23   ALT 0 - 44 U/L   10     Imaging studies: No new pertinent imaging  studies   Assessment/Plan:  71 y.o. female with complaint of diverticulitis 5 Day Post-Op s/p partial colectomy with anastomosis and loop ileostomy creation.  -Patient recovering slowly. -Continue having adequate function of the ileostomy -Tolerating soft diet continue with soft diet. -Encourage patient to get out of bed and ambulate.  COVID-19 -Patient diagnosed with COVID-19.  Seems to be symptomatic.  She was started on treatment by hospitalist. -Management as per hospitalist -As per social worker, patient cannot be discharged for at least 10 days to be transferred to skilled nursing facility.  Arnold Long, MD

## 2021-08-06 DIAGNOSIS — J9621 Acute and chronic respiratory failure with hypoxia: Secondary | ICD-10-CM | POA: Diagnosis not present

## 2021-08-06 DIAGNOSIS — A419 Sepsis, unspecified organism: Secondary | ICD-10-CM | POA: Diagnosis not present

## 2021-08-06 DIAGNOSIS — U071 COVID-19: Secondary | ICD-10-CM | POA: Diagnosis not present

## 2021-08-06 LAB — COMPREHENSIVE METABOLIC PANEL
ALT: 14 U/L (ref 0–44)
AST: 30 U/L (ref 15–41)
Albumin: 2.4 g/dL — ABNORMAL LOW (ref 3.5–5.0)
Alkaline Phosphatase: 33 U/L — ABNORMAL LOW (ref 38–126)
Anion gap: 9 (ref 5–15)
BUN: 12 mg/dL (ref 8–23)
CO2: 36 mmol/L — ABNORMAL HIGH (ref 22–32)
Calcium: 8.5 mg/dL — ABNORMAL LOW (ref 8.9–10.3)
Chloride: 95 mmol/L — ABNORMAL LOW (ref 98–111)
Creatinine, Ser: 0.43 mg/dL — ABNORMAL LOW (ref 0.44–1.00)
GFR, Estimated: >60 mL/min (ref >60)
Glucose, Bld: 117 mg/dL — ABNORMAL HIGH (ref 70–99)
Potassium: 3.6 mmol/L (ref 3.5–5.1)
Sodium: 140 mmol/L (ref 135–145)
Total Bilirubin: 0.8 mg/dL (ref 0.3–1.2)
Total Protein: 6 g/dL — ABNORMAL LOW (ref 6.5–8.1)

## 2021-08-06 LAB — CBC WITH DIFFERENTIAL/PLATELET
Abs Immature Granulocytes: 0.02 10*3/uL (ref 0.00–0.07)
Basophils Absolute: 0 10*3/uL (ref 0.0–0.1)
Basophils Relative: 0 %
Eosinophils Absolute: 0 10*3/uL (ref 0.0–0.5)
Eosinophils Relative: 0 %
HCT: 34.7 % — ABNORMAL LOW (ref 36.0–46.0)
Hemoglobin: 11 g/dL — ABNORMAL LOW (ref 12.0–15.0)
Immature Granulocytes: 1 %
Lymphocytes Relative: 17 %
Lymphs Abs: 0.6 10*3/uL — ABNORMAL LOW (ref 0.7–4.0)
MCH: 28 pg (ref 26.0–34.0)
MCHC: 31.7 g/dL (ref 30.0–36.0)
MCV: 88.3 fL (ref 80.0–100.0)
Monocytes Absolute: 0.3 10*3/uL (ref 0.1–1.0)
Monocytes Relative: 9 %
Neutro Abs: 2.6 10*3/uL (ref 1.7–7.7)
Neutrophils Relative %: 73 %
Platelets: 212 10*3/uL (ref 150–400)
RBC: 3.93 MIL/uL (ref 3.87–5.11)
RDW: 14.4 % (ref 11.5–15.5)
WBC: 3.5 10*3/uL — ABNORMAL LOW (ref 4.0–10.5)
nRBC: 0 % (ref 0.0–0.2)

## 2021-08-06 LAB — MAGNESIUM: Magnesium: 1.8 mg/dL (ref 1.7–2.4)

## 2021-08-06 LAB — D-DIMER, QUANTITATIVE: D-Dimer, Quant: 1.77 ug/mL-FEU — ABNORMAL HIGH (ref 0.00–0.50)

## 2021-08-06 LAB — LACTATE DEHYDROGENASE: LDH: 134 U/L (ref 98–192)

## 2021-08-06 LAB — C-REACTIVE PROTEIN: CRP: 10.4 mg/dL — ABNORMAL HIGH (ref <1.0)

## 2021-08-06 MED ORDER — HYDROCOD POLI-CHLORPHE POLI ER 10-8 MG/5ML PO SUER
5.0000 mL | Freq: Two times a day (BID) | ORAL | Status: DC
  Administered 2021-08-06 – 2021-08-08 (×5): 5 mL via ORAL
  Filled 2021-08-06 (×5): qty 5

## 2021-08-06 NOTE — Progress Notes (Signed)
At bedside for PIV placement. R arm restriction. Poor vasculature. Pt had multiple PIV's placed and removed due to infiltration. Stuck x 2. Able to obtain PIV in RUA. Reported findings to Dr. Sabino Gasser. Unable to place midline until Remdesivir is completed, as pH 3-4. RN aware.

## 2021-08-06 NOTE — Progress Notes (Signed)
Patient ID: Brianna Briggs, female   DOB: [birth year], 71 y.o.   MRN: [id number]     [place] Hospital Day(s): 9.   Interval History: Patient seen and examined, no acute events or new complaints overnight. Patient reports having pain on her ribs. No abdominal pain. No nausea or vomiting. Main complain is cough.  Vital signs in last 24 hours: [min-max] current  Temp:  [97.7 F (36.5 C)-98.3 F (36.8 C)] 97.7 F (36.5 C) (07/22 0836) Pulse Rate:  [63-64] 63 (07/22 0836) Resp:  [14-18] 14 (07/22 0836) BP: (91-107)/(54-63) 91/54 (07/22 0836) SpO2:  [93 %-96 %] 96 % (07/22 0836) Weight:  [55.9 kg] 55.9 kg (07/22 0500)     Height: '5\' 2"'$  (157.5 cm) Weight: 55.9 kg BMI (Calculated): 22.53   Physical Exam:  Constitutional: alert, cooperative and no distress  Respiratory: breathing non-labored at rest  Cardiovascular: regular rate and sinus rhythm  Gastrointestinal: soft, non-tender, and non-distended. Ileostomy pink and patent.   Labs:     Latest Ref Rng & Units 08/06/2021    5:31 AM 08/05/2021    6:10 AM 07/30/2021    7:15 AM  CBC  WBC 4.0 - 10.5 K/uL 3.5  3.0  6.4   Hemoglobin 12.0 - 15.0 g/dL 11.0  13.4  10.6   Hematocrit 36.0 - 46.0 % 34.7  35.9  33.0   Platelets 150 - 400 K/uL 212  182  177       Latest Ref Rng & Units 08/06/2021    5:31 AM 07/29/2021    4:25 AM 07/28/2021    5:42 AM  CMP  Glucose 70 - 99 mg/dL 117  111  88   BUN 8 - 23 mg/dL 12  9  <5   Creatinine 0.44 - 1.00 mg/dL 0.43  0.66  0.63   Sodium 135 - 145 mmol/L 140  138  139   Potassium 3.5 - 5.1 mmol/L 3.6  4.3  3.3   Chloride 98 - 111 mmol/L 95  102  99   CO2 22 - 32 mmol/L 36  31  33   Calcium 8.9 - 10.3 mg/dL 8.5  8.4  8.3   Total Protein 6.5 - 8.1 g/dL 6.0     Total Bilirubin 0.3 - 1.2 mg/dL 0.8     Alkaline Phos 38 - 126 U/L 33     AST 15 - 41 U/L 30     ALT 0 - 44 U/L 14       Imaging studies: No new pertinent imaging studies   Assessment/Plan:  71 y.o. female with complaint of  diverticulitis 6 Day Post-Op s/p partial colectomy with anastomosis and loop ileostomy creation.   -Stable vital signs, no fever. -Continue having adequate function of the ileostomy -Advanced diet to heart healthy diet.  -Encourage patient to get out of bed and ambulate.   COVID-19 -Patient diagnosed with COVID-19.  Continue with cough.  She was started on treatment by hospitalist. -Also she is smoker and COPD as per Dr. Raul Del note  -Management as per hospitalist -As per social worker, patient cannot be discharged for at least 10 days to be transferred to skilled nursing facility.  Arnold Long, MD

## 2021-08-06 NOTE — Progress Notes (Signed)
PT Cancellation Note  Patient Details Name: [name] MRN: [id number] DOB: 1950/03/30   Cancelled Treatment:    Reason Eval/Treat Not Completed: Pt declined therapy due to Fatigue/lethargy limiting ability to participate   Bjorn Loser, PTA  08/06/21, 12:33 PM

## 2021-08-06 NOTE — Progress Notes (Signed)
Progress Note    Brianna Briggs   LGX:[id number]  DOB: Dec 15, 1950  DOA: 07/27/2021     9 PCP: Kirk Ruths, MD  Initial CC: abdominal pain  Hospital Course: Brianna Briggs is a 71 yo female with PMH 71 year old with history of CAD, HLD, solitary kidney, recurrent diverticulitis with abscess who presented with epigastric pain.  She had been on outpatient antibiotics with minimal improvement. CT abdomen/pelvis on admission showed persistent abscess attributed to acute sigmoid diverticulitis. She had initially been treated with percutaneous drainage and IV antibiotics as well. She was followed by general surgery and ultimately underwent sigmoid resection with anastomosis and loop ileostomy creation. She had persistent lethargy and poor appetite after surgery.  She had learned that her husband was diagnosed with COVID and had also been visiting her in the hospital.  She underwent COVID-19 testing on 08/04/2021 which was also positive.  She was started on remdesivir and steroids given her worsening oxygen requirement from her baseline as well.  Interval History:  No events overnight.  Resting when seen this morning laying on her left side.  Pain regimen seems to be working but she still appears lethargic each day.  Appetite was a little better today and she endorsed eating a little better this morning. Difficulty with IV access.  New IV placed in left arm today.  Assessment and Plan: * Sepsis due to diverticulitis with abscess (HCC)-resolved as of 08/04/2021 - Patient meets sepsis criteria with increased heart rate, leukocytosis, source of intra-abdominal abscess -S/p treatment with percutaneous drainage and prior IV antibiotic course - Now has undergone sigmoid resection with loop ileostomy creation - Antibiotics have now been discontinued  Diverticulitis of intestine with abscess - Complicated diverticulitis with abscess - now s/p sigmoid resection with loop ileostomy creation on  07/31/2021 -Diet has been advanced to soft diet per surgery -Continue monitoring oral intake; needs improvement - Continue nausea control - Continue monitoring ileostomy output; adequate  COVID-19 virus infection - patient endorsed husband recently diagnosed after visiting her - patient tested on 7/20 and found to be positive; possibly explains her persistent lethargy, poor appetite, slightly increased O2 demands - started Remdesivir 7/20 x 3 days - continue steroids given acute on chronic worsened hypoxia  - continue mucinex and spirometer use - trending inflammatory markers   Abdominal pain - Patient still complaining of ongoing pain s/p surgery - norco working, continue for now with dilaudid for breakthrough  Acute on chronic respiratory failure with hypoxia ([place]) - recently started on nocturnal O2 at home ~2L but starting to have increased O2 demands on 7/18 - suspect some atelectasis postop - continue aggressive IS use - CXR on 7/19 shows atelectasis and some small effusions; s/p lasix x 1 on 7/19; Continue encouraging PO intake  - on 7/20, covid-19 positive so some possible contribution as well (see treatment)  MDD (major depressive disorder) - Spoke with patient at length bedside this morning.  She does have a history of depression and has been treated with medications in the past.  She could not recall which names but thinks 1 was Wellbutrin as she was also on it to help with smoking cessation. - specifically denies SI/HI - situationally she has lots of stressors in life that are persistent and she does meet some criteria to consider treatment (endorses difficulty with sleep, decreased energy, decreased appetite); given her prior percutaneous drain, this was also playing a role in some of her depression and also decreased ability to eat much and  therefore had less energy.  After discussion, she is declining starting on medication at this time but understands if she changes her  mind, this would be considered appropriate and reasonable  Malnutrition of moderate degree - Patient's BMI is Body mass index is 23.27 kg/m.. - Patient has the following signs/symptoms consistent with PCM: (fat loss, muscle loss). - seen by RD, appreciate assistance. Continue plan per RD to include MVI, Ensure  Chronic obstructive pulmonary disease (COPD) ([place]) - No signs or symptoms of exacerbation at this time -Continue albuterol as needed -Patient quit smoking about 3 weeks ago  Tobacco abuse - Nicotine patch as needed ordered for nicotine craving - Counseled patient that tobacco abuse also decreases wound healing  Hyperlipidemia, mixed - okay to hold statin for now    Old records reviewed in assessment of this patient  Antimicrobials: Rocephin 07/27/2021 >> 07/31/2021 Flagyl 07/27/2021 >> 08/01/2021 Remdesivir 08/04/2021 >> 08/06/2021  DVT prophylaxis:  heparin injection 5,000 Units Start: 07/28/21 0600 Place TED hose Start: 07/27/21 1714   Code Status:   Code Status: Full Code  Mobility Assessment (last 72 hours)     Mobility Assessment     Row Name 08/05/21 2024 08/05/21 1400 08/05/21 0940 08/03/21 1549     Does patient have an order for bedrest or is patient medically unstable No - Continue assessment No - Continue assessment No - Continue assessment --    What is the highest level of mobility based on the progressive mobility assessment? Level 4 (Walks with assist in room) - Balance while marching in place and cannot step forward and back - Complete -- -- Level 4 (Walks with assist in room) - Balance while marching in place and cannot step forward and back - Complete             Disposition Plan:  SNF Status is: Inpt  Objective: Blood pressure (!) 95/56, pulse 74, temperature 97.9 F (36.6 C), resp. rate 19, height '5\' 2"'$  (1.575 m), weight 55.9 kg, SpO2 92 %.  Examination:  Physical Exam Constitutional:      Appearance: Normal appearance.  HENT:     Head:  Normocephalic and atraumatic.     Mouth/Throat:     Mouth: Mucous membranes are moist.  Eyes:     Extraocular Movements: Extraocular movements intact.  Cardiovascular:     Rate and Rhythm: Normal rate and regular rhythm.  Abdominal:     General: There is no distension.     Palpations: Abdomen is soft.     Comments: Small amount of stool noted in ostomy bag; appropriately tender to palpation.  Honeycomb dressing now removed; staples remain in place.  Musculoskeletal:        General: No swelling.     Cervical back: Normal range of motion and neck supple.  Skin:    General: Skin is warm and dry.  Neurological:     General: No focal deficit present.     Mental Status: She is alert.  Psychiatric:        Mood and Affect: Mood normal.      Consultants:  General surgery   Procedures:  Sigmoid colon resection with anastomosis, mobilization of splenic flexure, loop ileostomy creation, 07/31/2021  Data Reviewed: Results for orders placed or performed during the hospital encounter of 07/27/21 (from the past 24 hour(s))  CBC with Differential/Platelet     Status: Abnormal   Collection Time: 08/06/21  5:31 AM  Result Value Ref Range   WBC 3.5 (L) 4.0 -  10.5 K/uL   RBC 3.93 3.87 - 5.11 MIL/uL   Hemoglobin 11.0 (L) 12.0 - 15.0 g/dL   HCT 34.7 (L) 36.0 - 46.0 %   MCV 88.3 80.0 - 100.0 fL   MCH 28.0 26.0 - 34.0 pg   MCHC 31.7 30.0 - 36.0 g/dL   RDW 14.4 11.5 - 15.5 %   Platelets 212 150 - 400 K/uL   nRBC 0.0 0.0 - 0.2 %   Neutrophils Relative % 73 %   Neutro Abs 2.6 1.7 - 7.7 K/uL   Lymphocytes Relative 17 %   Lymphs Abs 0.6 (L) 0.7 - 4.0 K/uL   Monocytes Relative 9 %   Monocytes Absolute 0.3 0.1 - 1.0 K/uL   Eosinophils Relative 0 %   Eosinophils Absolute 0.0 0.0 - 0.5 K/uL   Basophils Relative 0 %   Basophils Absolute 0.0 0.0 - 0.1 K/uL   Immature Granulocytes 1 %   Abs Immature Granulocytes 0.02 0.00 - 0.07 K/uL  Comprehensive metabolic panel     Status: Abnormal    Collection Time: 08/06/21  5:31 AM  Result Value Ref Range   Sodium 140 135 - 145 mmol/L   Potassium 3.6 3.5 - 5.1 mmol/L   Chloride 95 (L) 98 - 111 mmol/L   CO2 36 (H) 22 - 32 mmol/L   Glucose, Bld 117 (H) 70 - 99 mg/dL   BUN 12 8 - 23 mg/dL   Creatinine, Ser 0.43 (L) 0.44 - 1.00 mg/dL   Calcium 8.5 (L) 8.9 - 10.3 mg/dL   Total Protein 6.0 (L) 6.5 - 8.1 g/dL   Albumin 2.4 (L) 3.5 - 5.0 g/dL   AST 30 15 - 41 U/L   ALT 14 0 - 44 U/L   Alkaline Phosphatase 33 (L) 38 - 126 U/L   Total Bilirubin 0.8 0.3 - 1.2 mg/dL   GFR, Estimated >60 >60 mL/min   Anion gap 9 5 - 15  Magnesium     Status: None   Collection Time: 08/06/21  5:31 AM  Result Value Ref Range   Magnesium 1.8 1.7 - 2.4 mg/dL  C-reactive protein     Status: Abnormal   Collection Time: 08/06/21  5:31 AM  Result Value Ref Range   CRP 10.4 (H) <1.0 mg/dL  D-dimer, quantitative     Status: Abnormal   Collection Time: 08/06/21  5:31 AM  Result Value Ref Range   D-Dimer, Quant 1.77 (H) 0.00 - 0.50 ug/mL-FEU  Lactate dehydrogenase     Status: None   Collection Time: 08/06/21  5:31 AM  Result Value Ref Range   LDH 134 98 - 192 U/L    I have Reviewed nursing notes, Vitals, and Lab results since pt's last encounter. Pertinent lab results : see above I have ordered test including BMP, CBC, Mg I have reviewed the last note from staff over past 24 hours I have discussed pt's care plan and test results with nursing staff, case manager   LOS: 9 days   Dwyane Dee, MD [facility] 08/06/2021, 3:26 PM

## 2021-08-07 ENCOUNTER — Inpatient Hospital Stay: Payer: Medicare Other

## 2021-08-07 DIAGNOSIS — J9621 Acute and chronic respiratory failure with hypoxia: Secondary | ICD-10-CM | POA: Diagnosis not present

## 2021-08-07 DIAGNOSIS — U071 COVID-19: Secondary | ICD-10-CM | POA: Diagnosis not present

## 2021-08-07 DIAGNOSIS — A419 Sepsis, unspecified organism: Secondary | ICD-10-CM | POA: Diagnosis not present

## 2021-08-07 LAB — CBC WITH DIFFERENTIAL/PLATELET
Abs Immature Granulocytes: 0.01 10*3/uL (ref 0.00–0.07)
Basophils Absolute: 0 10*3/uL (ref 0.0–0.1)
Basophils Relative: 0 %
Eosinophils Absolute: 0 10*3/uL (ref 0.0–0.5)
Eosinophils Relative: 0 %
HCT: 34.3 % — ABNORMAL LOW (ref 36.0–46.0)
Hemoglobin: 10.8 g/dL — ABNORMAL LOW (ref 12.0–15.0)
Immature Granulocytes: 0 %
Lymphocytes Relative: 11 %
Lymphs Abs: 0.4 10*3/uL — ABNORMAL LOW (ref 0.7–4.0)
MCH: 28 pg (ref 26.0–34.0)
MCHC: 31.5 g/dL (ref 30.0–36.0)
MCV: 88.9 fL (ref 80.0–100.0)
Monocytes Absolute: 0.3 10*3/uL (ref 0.1–1.0)
Monocytes Relative: 8 %
Neutro Abs: 2.6 10*3/uL (ref 1.7–7.7)
Neutrophils Relative %: 81 %
Platelets: 252 10*3/uL (ref 150–400)
RBC: 3.86 MIL/uL — ABNORMAL LOW (ref 3.87–5.11)
RDW: 14.7 % (ref 11.5–15.5)
WBC: 3.3 10*3/uL — ABNORMAL LOW (ref 4.0–10.5)
nRBC: 0 % (ref 0.0–0.2)

## 2021-08-07 LAB — COMPREHENSIVE METABOLIC PANEL
ALT: 13 U/L (ref 0–44)
AST: 29 U/L (ref 15–41)
Albumin: 2.5 g/dL — ABNORMAL LOW (ref 3.5–5.0)
Alkaline Phosphatase: 31 U/L — ABNORMAL LOW (ref 38–126)
Anion gap: 8 (ref 5–15)
BUN: 17 mg/dL (ref 8–23)
CO2: 38 mmol/L — ABNORMAL HIGH (ref 22–32)
Calcium: 8.5 mg/dL — ABNORMAL LOW (ref 8.9–10.3)
Chloride: 95 mmol/L — ABNORMAL LOW (ref 98–111)
Creatinine, Ser: 0.57 mg/dL (ref 0.44–1.00)
GFR, Estimated: >60 mL/min (ref >60)
Glucose, Bld: 124 mg/dL — ABNORMAL HIGH (ref 70–99)
Potassium: 3.7 mmol/L (ref 3.5–5.1)
Sodium: 141 mmol/L (ref 135–145)
Total Bilirubin: 0.7 mg/dL (ref 0.3–1.2)
Total Protein: 6 g/dL — ABNORMAL LOW (ref 6.5–8.1)

## 2021-08-07 LAB — LACTATE DEHYDROGENASE: LDH: 129 U/L (ref 98–192)

## 2021-08-07 LAB — D-DIMER, QUANTITATIVE: D-Dimer, Quant: 1.37 ug/mL-FEU — ABNORMAL HIGH (ref 0.00–0.50)

## 2021-08-07 LAB — C-REACTIVE PROTEIN: CRP: 5.6 mg/dL — ABNORMAL HIGH (ref <1.0)

## 2021-08-07 LAB — MAGNESIUM: Magnesium: 1.9 mg/dL (ref 1.7–2.4)

## 2021-08-07 NOTE — Progress Notes (Signed)
Patient ID: Brianna Briggs, female   DOB: [birth year], 71 y.o.   MRN: [id number]     [facility] Day(s): 10.   Interval History: Patient seen and examined, no acute events or new complaints overnight. Patient reports feeling OK.  No new complaints.  No nausea or vomiting.  No significant abdominal pain.  Tolerating diet.  Ileostomy working adequately.  Improved cough.  Vital signs in last 24 hours: [min-max] current  Temp:  [97.5 F (36.4 C)-98.4 F (36.9 C)] 98.4 F (36.9 C) (07/23 0754) Pulse Rate:  [52-80] 55 (07/23 0754) Resp:  [15-19] 15 (07/23 0754) BP: (95-114)/(56-63) 114/63 (07/23 0754) SpO2:  [87 %-94 %] 91 % (07/23 0754) Weight:  [54.6 kg] 54.6 kg (07/23 0500)     Height: '5\' 2"'$  (157.5 cm) Weight: 54.6 kg BMI (Calculated): 22.01   Physical Exam:  Constitutional: alert, cooperative and no distress  Respiratory: breathing non-labored at rest  Cardiovascular: regular rate and sinus rhythm  Gastrointestinal: soft, non-tender, and non-distended.  Ileostomy pink and patent  Labs:     Latest Ref Rng & Units 08/07/2021    4:25 AM 08/06/2021    5:31 AM 08/05/2021    6:10 AM  CBC  WBC 4.0 - 10.5 K/uL 3.3  3.5  3.0   Hemoglobin 12.0 - 15.0 g/dL 10.8  11.0  13.4   Hematocrit 36.0 - 46.0 % 34.3  34.7  35.9   Platelets 150 - 400 K/uL 252  212  182       Latest Ref Rng & Units 08/07/2021    4:25 AM 08/06/2021    5:31 AM 07/29/2021    4:25 AM  CMP  Glucose 70 - 99 mg/dL 124  117  111   BUN 8 - 23 mg/dL '17  12  9   '$ Creatinine 0.44 - 1.00 mg/dL 0.57  0.43  0.66   Sodium 135 - 145 mmol/L 141  140  138   Potassium 3.5 - 5.1 mmol/L 3.7  3.6  4.3   Chloride 98 - 111 mmol/L 95  95  102   CO2 22 - 32 mmol/L 38  36  31   Calcium 8.9 - 10.3 mg/dL 8.5  8.5  8.4   Total Protein 6.5 - 8.1 g/dL 6.0  6.0    Total Bilirubin 0.3 - 1.2 mg/dL 0.7  0.8    Alkaline Phos 38 - 126 U/L 31  33    AST 15 - 41 U/L 29  30    ALT 0 - 44 U/L 13  14      Imaging studies: No new  pertinent imaging studies   Assessment/Plan:  71 y.o. female with complaint of diverticulitis 7 Day Post-Op s/p partial colectomy with anastomosis and loop ileostomy creation.   -Stable vital signs, no fever. -Continue having adequate function of the ileostomy -Tolerating diet -Encourage patient to get out of bed and ambulate.   COVID-19 -Patient diagnosed with COVID-19.   -Completed treatment -Cough improved today -Also she is smoker and COPD as per Dr. Raul Del note  -Management as per hospitalist -As per social worker, patient cannot be discharged for at least 10 days to be transferred to skilled nursing facility.  Arnold Long, MD

## 2021-08-07 NOTE — Progress Notes (Signed)
Progress Note    Brianna Briggs   [id number]  DOB: May 30, 1950  DOA: 07/27/2021     10 PCP: Brianna Ruths, MD  Initial CC: abdominal pain  Hospital Course: Brianna Briggs is a 71 yo female with PMH 71 year old with history of CAD, HLD, solitary kidney, recurrent diverticulitis with abscess who presented with epigastric pain.  She had been on outpatient antibiotics with minimal improvement. CT abdomen/pelvis on admission showed persistent abscess attributed to acute sigmoid diverticulitis. She had initially been treated with percutaneous drainage and IV antibiotics as well. She was followed by general surgery and ultimately underwent sigmoid resection with anastomosis and loop ileostomy creation. She had persistent lethargy and poor appetite after surgery.  She had learned that her husband was diagnosed with COVID and had also been visiting her in the hospital.  She underwent COVID-19 testing on 08/04/2021 which was also positive.  She was started on remdesivir and steroids given her worsening oxygen requirement from her baseline as well.  Interval History:  No events overnight.  Additional cough syrup started yesterday has helped improve her cough.  Appetite still remains somewhat poor but also she states is timing of the food and getting cold before she can eat it. Breathing does feel better and less tight.  She understands plan is for remaining in the hospital to complete her quarantine period.   Assessment and Plan: * Sepsis due to diverticulitis with abscess (HCC)-resolved as of 08/04/2021 - Patient meets sepsis criteria with increased heart rate, leukocytosis, source of intra-abdominal abscess -S/p treatment with percutaneous drainage and prior IV antibiotic course - Now has undergone sigmoid resection with loop ileostomy creation - Antibiotics have now been discontinued  Diverticulitis of intestine with abscess - Complicated diverticulitis with abscess - now s/p sigmoid  resection with loop ileostomy creation on 07/31/2021 -Diet has been advanced to soft diet per surgery -Continue monitoring oral intake; needs improvement - Continue nausea control - Continue monitoring ileostomy output; adequate  COVID-19 virus infection - patient endorsed husband recently diagnosed after visiting her - patient tested on 7/20 and found to be positive; possibly explains her persistent lethargy, poor appetite, slightly increased O2 demands - completed Remdesivir 7/20 x 3 days - continue steroids given acute on chronic worsened hypoxia  - continue mucinex and spirometer use - trending inflammatory markers   Abdominal pain - Patient still complaining of ongoing pain s/p surgery - norco working, continue for now with dilaudid for breakthrough  Acute on chronic respiratory failure with hypoxia ([place]) - recently started on nocturnal O2 at home ~2L but starting to have increased O2 demands on 7/18 - suspect some atelectasis postop - continue aggressive IS use - CXR on 7/19 shows atelectasis and some small effusions; s/p lasix x 1 on 7/19; Continue encouraging PO intake  - on 7/20, covid-19 positive so some possible contribution as well (see treatment)  MDD (major depressive disorder) - Spoke with patient at length bedside this morning.  She does have a history of depression and has been treated with medications in the past.  She could not recall which names but thinks 1 was Wellbutrin as she was also on it to help with smoking cessation. - specifically denies SI/HI - situationally she has lots of stressors in life that are persistent and she does meet some criteria to consider treatment (endorses difficulty with sleep, decreased energy, decreased appetite); given her prior percutaneous drain, this was also playing a role in some of her depression and also decreased ability  to eat much and therefore had less energy.  After discussion, she is declining starting on medication at this  time but understands if she changes her mind, this would be considered appropriate and reasonable  Malnutrition of moderate degree - Patient's BMI is Body mass index is 23.27 kg/m.. - Patient has the following signs/symptoms consistent with PCM: (fat loss, muscle loss). - seen by RD, appreciate assistance. Continue plan per RD to include MVI, Ensure  Chronic obstructive pulmonary disease (COPD) ([place]) - No signs or symptoms of exacerbation at this time -Continue albuterol as needed -Patient quit smoking about 3 weeks ago  Tobacco abuse - Nicotine patch as needed ordered for nicotine craving - Counseled patient that tobacco abuse also decreases wound healing  Hyperlipidemia, mixed - okay to hold statin for now    Old records reviewed in assessment of this patient  Antimicrobials: Rocephin 07/27/2021 >> 07/31/2021 Flagyl 07/27/2021 >> 08/01/2021 Remdesivir 08/04/2021 >> 08/06/2021  DVT prophylaxis:  heparin injection 5,000 Units Start: 07/28/21 0600 Place TED hose Start: 07/27/21 1714   Code Status:   Code Status: Full Code  Mobility Assessment (last 72 hours)     Mobility Assessment     Row Name 08/07/21 1100 08/06/21 2000 08/06/21 1000 08/05/21 2024 08/05/21 1400   Does patient have an order for bedrest or is patient medically unstable No - Continue assessment No - Continue assessment No - Continue assessment No - Continue assessment No - Continue assessment   What is the highest level of mobility based on the progressive mobility assessment? Level 4 (Walks with assist in room) - Balance while marching in place and cannot step forward and back - Complete Level 4 (Walks with assist in room) - Balance while marching in place and cannot step forward and back - Complete Level 4 (Walks with assist in room) - Balance while marching in place and cannot step forward and back - Complete Level 4 (Walks with assist in room) - Balance while marching in place and cannot step forward and back -  Complete --    Row Name 08/05/21 0940           Does patient have an order for bedrest or is patient medically unstable No - Continue assessment                Disposition Plan:  SNF Status is: Inpt  Objective: Blood pressure 114/63, pulse (!) 55, temperature 98.4 F (36.9 C), resp. rate 15, height '5\' 2"'$  (1.575 m), weight 54.6 kg, SpO2 91 %.  Examination:  Physical Exam Constitutional:      Appearance: Normal appearance.  HENT:     Head: Normocephalic and atraumatic.     Mouth/Throat:     Mouth: Mucous membranes are moist.  Eyes:     Extraocular Movements: Extraocular movements intact.  Cardiovascular:     Rate and Rhythm: Normal rate and regular rhythm.  Abdominal:     General: There is no distension.     Palpations: Abdomen is soft.     Comments: Brown stool noted in ostomy bag.  Staples in place in midline of abdomen  Musculoskeletal:        General: No swelling.     Cervical back: Normal range of motion and neck supple.  Skin:    General: Skin is warm and dry.  Neurological:     General: No focal deficit present.     Mental Status: She is alert.  Psychiatric:  Mood and Affect: Mood normal.      Consultants:  General surgery   Procedures:  Sigmoid colon resection with anastomosis, mobilization of splenic flexure, loop ileostomy creation, 07/31/2021  Data Reviewed: Results for orders placed or performed during the hospital encounter of 07/27/21 (from the past 24 hour(s))  CBC with Differential/Platelet     Status: Abnormal   Collection Time: 08/07/21  4:25 AM  Result Value Ref Range   WBC 3.3 (L) 4.0 - 10.5 K/uL   RBC 3.86 (L) 3.87 - 5.11 MIL/uL   Hemoglobin 10.8 (L) 12.0 - 15.0 g/dL   HCT 34.3 (L) 36.0 - 46.0 %   MCV 88.9 80.0 - 100.0 fL   MCH 28.0 26.0 - 34.0 pg   MCHC 31.5 30.0 - 36.0 g/dL   RDW 14.7 11.5 - 15.5 %   Platelets 252 150 - 400 K/uL   nRBC 0.0 0.0 - 0.2 %   Neutrophils Relative % 81 %   Neutro Abs 2.6 1.7 - 7.7 K/uL    Lymphocytes Relative 11 %   Lymphs Abs 0.4 (L) 0.7 - 4.0 K/uL   Monocytes Relative 8 %   Monocytes Absolute 0.3 0.1 - 1.0 K/uL   Eosinophils Relative 0 %   Eosinophils Absolute 0.0 0.0 - 0.5 K/uL   Basophils Relative 0 %   Basophils Absolute 0.0 0.0 - 0.1 K/uL   Immature Granulocytes 0 %   Abs Immature Granulocytes 0.01 0.00 - 0.07 K/uL  Comprehensive metabolic panel     Status: Abnormal   Collection Time: 08/07/21  4:25 AM  Result Value Ref Range   Sodium 141 135 - 145 mmol/L   Potassium 3.7 3.5 - 5.1 mmol/L   Chloride 95 (L) 98 - 111 mmol/L   CO2 38 (H) 22 - 32 mmol/L   Glucose, Bld 124 (H) 70 - 99 mg/dL   BUN 17 8 - 23 mg/dL   Creatinine, Ser 0.57 0.44 - 1.00 mg/dL   Calcium 8.5 (L) 8.9 - 10.3 mg/dL   Total Protein 6.0 (L) 6.5 - 8.1 g/dL   Albumin 2.5 (L) 3.5 - 5.0 g/dL   AST 29 15 - 41 U/L   ALT 13 0 - 44 U/L   Alkaline Phosphatase 31 (L) 38 - 126 U/L   Total Bilirubin 0.7 0.3 - 1.2 mg/dL   GFR, Estimated >60 >60 mL/min   Anion gap 8 5 - 15  Magnesium     Status: None   Collection Time: 08/07/21  4:25 AM  Result Value Ref Range   Magnesium 1.9 1.7 - 2.4 mg/dL  C-reactive protein     Status: Abnormal   Collection Time: 08/07/21  4:25 AM  Result Value Ref Range   CRP 5.6 (H) <1.0 mg/dL  D-dimer, quantitative     Status: Abnormal   Collection Time: 08/07/21  4:25 AM  Result Value Ref Range   D-Dimer, Quant 1.37 (H) 0.00 - 0.50 ug/mL-FEU  Lactate dehydrogenase     Status: None   Collection Time: 08/07/21  4:25 AM  Result Value Ref Range   LDH 129 98 - 192 U/L    I have Reviewed nursing notes, Vitals, and Lab results since pt's last encounter. Pertinent lab results : see above I have ordered test including BMP, CBC, Mg I have reviewed the last note from staff over past 24 hours I have discussed pt's care plan and test results with nursing staff, case manager   LOS: 10 days   Dwyane Dee, MD  [facility] 08/07/2021, 12:30 PM

## 2021-08-08 DIAGNOSIS — A419 Sepsis, unspecified organism: Secondary | ICD-10-CM | POA: Diagnosis not present

## 2021-08-08 DIAGNOSIS — K5720 Diverticulitis of large intestine with perforation and abscess without bleeding: Secondary | ICD-10-CM | POA: Diagnosis not present

## 2021-08-08 DIAGNOSIS — U071 COVID-19: Secondary | ICD-10-CM | POA: Diagnosis not present

## 2021-08-08 DIAGNOSIS — J9621 Acute and chronic respiratory failure with hypoxia: Secondary | ICD-10-CM | POA: Diagnosis not present

## 2021-08-08 LAB — CBC WITH DIFFERENTIAL/PLATELET
Abs Immature Granulocytes: 0.04 10*3/uL (ref 0.00–0.07)
Basophils Absolute: 0 10*3/uL (ref 0.0–0.1)
Basophils Relative: 0 %
Eosinophils Absolute: 0 10*3/uL (ref 0.0–0.5)
Eosinophils Relative: 0 %
HCT: 36 % (ref 36.0–46.0)
Hemoglobin: 11.3 g/dL — ABNORMAL LOW (ref 12.0–15.0)
Immature Granulocytes: 1 %
Lymphocytes Relative: 14 %
Lymphs Abs: 0.5 10*3/uL — ABNORMAL LOW (ref 0.7–4.0)
MCH: 28 pg (ref 26.0–34.0)
MCHC: 31.4 g/dL (ref 30.0–36.0)
MCV: 89.3 fL (ref 80.0–100.0)
Monocytes Absolute: 0.3 10*3/uL (ref 0.1–1.0)
Monocytes Relative: 7 %
Neutro Abs: 2.7 10*3/uL (ref 1.7–7.7)
Neutrophils Relative %: 78 %
Platelets: 261 10*3/uL (ref 150–400)
RBC: 4.03 MIL/uL (ref 3.87–5.11)
RDW: 14.9 % (ref 11.5–15.5)
WBC: 3.5 10*3/uL — ABNORMAL LOW (ref 4.0–10.5)
nRBC: 0 % (ref 0.0–0.2)

## 2021-08-08 LAB — COMPREHENSIVE METABOLIC PANEL
ALT: 15 U/L (ref 0–44)
AST: 32 U/L (ref 15–41)
Albumin: 2.8 g/dL — ABNORMAL LOW (ref 3.5–5.0)
Alkaline Phosphatase: 36 U/L — ABNORMAL LOW (ref 38–126)
Anion gap: 9 (ref 5–15)
BUN: 20 mg/dL (ref 8–23)
CO2: 36 mmol/L — ABNORMAL HIGH (ref 22–32)
Calcium: 8.8 mg/dL — ABNORMAL LOW (ref 8.9–10.3)
Chloride: 99 mmol/L (ref 98–111)
Creatinine, Ser: 0.56 mg/dL (ref 0.44–1.00)
GFR, Estimated: >60 mL/min (ref >60)
Glucose, Bld: 113 mg/dL — ABNORMAL HIGH (ref 70–99)
Potassium: 3.8 mmol/L (ref 3.5–5.1)
Sodium: 144 mmol/L (ref 135–145)
Total Bilirubin: 0.6 mg/dL (ref 0.3–1.2)
Total Protein: 6.3 g/dL — ABNORMAL LOW (ref 6.5–8.1)

## 2021-08-08 LAB — D-DIMER, QUANTITATIVE: D-Dimer, Quant: 1.33 ug{FEU}/mL — ABNORMAL HIGH (ref 0.00–0.50)

## 2021-08-08 LAB — MAGNESIUM: Magnesium: 2.2 mg/dL (ref 1.7–2.4)

## 2021-08-08 LAB — C-REACTIVE PROTEIN: CRP: 3.3 mg/dL — ABNORMAL HIGH (ref <1.0)

## 2021-08-08 LAB — LACTATE DEHYDROGENASE: LDH: 136 U/L (ref 98–192)

## 2021-08-08 MED ORDER — HYDROCOD POLI-CHLORPHE POLI ER 10-8 MG/5ML PO SUER
5.0000 mL | Freq: Two times a day (BID) | ORAL | Status: DC | PRN
  Administered 2021-08-11 – 2021-08-15 (×3): 5 mL via ORAL
  Filled 2021-08-08 (×4): qty 5

## 2021-08-08 NOTE — Progress Notes (Signed)
Progress Note    Brianna Briggs   JFH:[id number]  DOB: 12/12/1950  DOA: 07/27/2021     11 PCP: Brianna Ruths, MD  Initial CC: abdominal pain  Hospital Course: Brianna Briggs is a 71 yo female with PMH 71 year old with history of CAD, HLD, solitary kidney, recurrent diverticulitis with abscess who presented with epigastric pain.  She had been on outpatient antibiotics with minimal improvement. CT abdomen/pelvis on admission showed persistent abscess attributed to acute sigmoid diverticulitis. She had initially been treated with percutaneous drainage and IV antibiotics as well. She was followed by general surgery and ultimately underwent sigmoid resection with anastomosis and loop ileostomy creation. She had persistent lethargy and poor appetite after surgery.  She had learned that her husband was diagnosed with COVID and had also been visiting her in the hospital.  She underwent COVID-19 testing on 08/04/2021 which was also positive.  She was started on remdesivir and steroids given her worsening oxygen requirement from her baseline as well.  Interval History:  No events overnight.  Resting in bed comfortably this morning.  Hoping to work some with physical therapy this week.  Breathing is comfortable and cough is resolved.  Assessment and Plan: * Sepsis due to diverticulitis with abscess (HCC)-resolved as of 08/04/2021 - Patient meets sepsis criteria with increased heart rate, leukocytosis, source of intra-abdominal abscess -S/p treatment with percutaneous drainage and prior IV antibiotic course - Now has undergone sigmoid resection with loop ileostomy creation - Antibiotics have now been discontinued  Diverticulitis of intestine with abscess - Complicated diverticulitis with abscess - now s/p sigmoid resection with loop ileostomy creation on 07/31/2021 -Diet has been advanced to soft diet per surgery -Continue monitoring oral intake; needs improvement - Continue nausea  control - Continue monitoring ileostomy output; adequate  COVID-19 virus infection - patient endorsed husband recently diagnosed after visiting her - patient tested on 7/20 and found to be positive; possibly explains her persistent lethargy, poor appetite, slightly increased O2 demands - completed Remdesivir 7/20 x 3 days - continue steroids given acute on chronic worsened hypoxia  - continue mucinex and spirometer use  Abdominal pain - Patient still complaining of ongoing pain s/p surgery - norco working, continue for now with dilaudid for breakthrough  Acute on chronic respiratory failure with hypoxia ([place]) - recently started on nocturnal O2 at home ~2L but starting to have increased O2 demands on 7/18 - suspect some atelectasis postop - continue aggressive IS use - CXR on 7/19 shows atelectasis and some small effusions; s/p lasix x 1 on 7/19; Continue encouraging PO intake  - on 7/20, covid-19 positive so some possible contribution as well (see treatment) -She has been weaned back down to baseline 2 L  MDD (major depressive disorder) - Spoke with patient at length bedside this morning.  She does have a history of depression and has been treated with medications in the past.  She could not recall which names but thinks 1 was Wellbutrin as she was also on it to help with smoking cessation. - specifically denies SI/HI - situationally she has lots of stressors in life that are persistent and she does meet some criteria to consider treatment (endorses difficulty with sleep, decreased energy, decreased appetite); given her prior percutaneous drain, this was also playing a role in some of her depression and also decreased ability to eat much and therefore had less energy.  After discussion, she is declining starting on medication at this time but understands if she changes her mind,  this would be considered appropriate and reasonable  Malnutrition of moderate degree - Patient's BMI is Body  mass index is 23.27 kg/m.. - Patient has the following signs/symptoms consistent with PCM: (fat loss, muscle loss). - seen by RD, appreciate assistance. Continue plan per RD to include MVI, Ensure  Chronic obstructive pulmonary disease (COPD) (HCC) - No signs or symptoms of exacerbation at this time -Continue albuterol as needed -Patient quit smoking about 3 weeks ago  Tobacco abuse - Nicotine patch as needed ordered for nicotine craving - Counseled patient that tobacco abuse also decreases wound healing  Hyperlipidemia, mixed - okay to hold statin for now    Old records reviewed in assessment of this patient  Antimicrobials: Rocephin 07/27/2021 >> 07/31/2021 Flagyl 07/27/2021 >> 08/01/2021 Remdesivir 08/04/2021 >> 08/06/2021  DVT prophylaxis:  heparin injection 5,000 Units Start: 07/28/21 0600 Place TED hose Start: 07/27/21 1714   Code Status:   Code Status: Full Code  Mobility Assessment (last 72 hours)     Mobility Assessment     Row Name 08/08/21 0731 08/07/21 2000 08/07/21 1100 08/06/21 2000 08/06/21 1000   Does patient have an order for bedrest or is patient medically unstable No - Continue assessment No - Continue assessment No - Continue assessment No - Continue assessment No - Continue assessment   What is the highest level of mobility based on the progressive mobility assessment? Level 4 (Walks with assist in room) - Balance while marching in place and cannot step forward and back - Complete Level 4 (Walks with assist in room) - Balance while marching in place and cannot step forward and back - Complete Level 4 (Walks with assist in room) - Balance while marching in place and cannot step forward and back - Complete Level 4 (Walks with assist in room) - Balance while marching in place and cannot step forward and back - Complete Level 4 (Walks with assist in room) - Balance while marching in place and cannot step forward and back - Complete    Row Name 08/05/21 2024 08/05/21  1400         Does patient have an order for bedrest or is patient medically unstable No - Continue assessment No - Continue assessment      What is the highest level of mobility based on the progressive mobility assessment? Level 4 (Walks with assist in room) - Balance while marching in place and cannot step forward and back - Complete --               Disposition Plan:  SNF Status is: Inpt  Objective: Blood pressure 124/68, pulse 72, temperature 98.2 F (36.8 C), resp. rate 17, height '5\' 2"'$  (1.575 m), weight 53.7 kg, SpO2 90 %.  Examination:  Physical Exam Constitutional:      Appearance: Normal appearance.  HENT:     Head: Normocephalic and atraumatic.     Mouth/Throat:     Mouth: Mucous membranes are moist.  Eyes:     Extraocular Movements: Extraocular movements intact.  Cardiovascular:     Rate and Rhythm: Normal rate and regular rhythm.  Abdominal:     General: There is no distension.     Palpations: Abdomen is soft.     Comments: Brown stool noted in ostomy bag.  Staples in place in midline of abdomen  Musculoskeletal:        General: No swelling.     Cervical back: Normal range of motion and neck supple.  Skin:  General: Skin is warm and dry.  Neurological:     General: No focal deficit present.     Mental Status: She is alert.  Psychiatric:        Mood and Affect: Mood normal.      Consultants:  General surgery   Procedures:  Sigmoid colon resection with anastomosis, mobilization of splenic flexure, loop ileostomy creation, 07/31/2021  Data Reviewed: Results for orders placed or performed during the hospital encounter of 07/27/21 (from the past 24 hour(s))  CBC with Differential/Platelet     Status: Abnormal   Collection Time: 08/08/21  4:55 AM  Result Value Ref Range   WBC 3.5 (L) 4.0 - 10.5 K/uL   RBC 4.03 3.87 - 5.11 MIL/uL   Hemoglobin 11.3 (L) 12.0 - 15.0 g/dL   HCT 36.0 36.0 - 46.0 %   MCV 89.3 80.0 - 100.0 fL   MCH 28.0 26.0 - 34.0 pg    MCHC 31.4 30.0 - 36.0 g/dL   RDW 14.9 11.5 - 15.5 %   Platelets 261 150 - 400 K/uL   nRBC 0.0 0.0 - 0.2 %   Neutrophils Relative % 78 %   Neutro Abs 2.7 1.7 - 7.7 K/uL   Lymphocytes Relative 14 %   Lymphs Abs 0.5 (L) 0.7 - 4.0 K/uL   Monocytes Relative 7 %   Monocytes Absolute 0.3 0.1 - 1.0 K/uL   Eosinophils Relative 0 %   Eosinophils Absolute 0.0 0.0 - 0.5 K/uL   Basophils Relative 0 %   Basophils Absolute 0.0 0.0 - 0.1 K/uL   Immature Granulocytes 1 %   Abs Immature Granulocytes 0.04 0.00 - 0.07 K/uL  Comprehensive metabolic panel     Status: Abnormal   Collection Time: 08/08/21  4:55 AM  Result Value Ref Range   Sodium 144 135 - 145 mmol/L   Potassium 3.8 3.5 - 5.1 mmol/L   Chloride 99 98 - 111 mmol/L   CO2 36 (H) 22 - 32 mmol/L   Glucose, Bld 113 (H) 70 - 99 mg/dL   BUN 20 8 - 23 mg/dL   Creatinine, Ser 0.56 0.44 - 1.00 mg/dL   Calcium 8.8 (L) 8.9 - 10.3 mg/dL   Total Protein 6.3 (L) 6.5 - 8.1 g/dL   Albumin 2.8 (L) 3.5 - 5.0 g/dL   AST 32 15 - 41 U/L   ALT 15 0 - 44 U/L   Alkaline Phosphatase 36 (L) 38 - 126 U/L   Total Bilirubin 0.6 0.3 - 1.2 mg/dL   GFR, Estimated >60 >60 mL/min   Anion gap 9 5 - 15  Magnesium     Status: None   Collection Time: 08/08/21  4:55 AM  Result Value Ref Range   Magnesium 2.2 1.7 - 2.4 mg/dL  C-reactive protein     Status: Abnormal   Collection Time: 08/08/21  4:55 AM  Result Value Ref Range   CRP 3.3 (H) <1.0 mg/dL  D-dimer, quantitative     Status: Abnormal   Collection Time: 08/08/21  4:55 AM  Result Value Ref Range   D-Dimer, Quant 1.33 (H) 0.00 - 0.50 ug/mL-FEU  Lactate dehydrogenase     Status: None   Collection Time: 08/08/21  4:55 AM  Result Value Ref Range   LDH 136 98 - 192 U/L    I have Reviewed nursing notes, Vitals, and Lab results since pt's last encounter. Pertinent lab results : see above I have ordered test including BMP, CBC, Mg I have reviewed the  last note from staff over past 24 hours I have discussed pt's  care plan and test results with nursing staff, case manager   LOS: 11 days   Dwyane Dee, MD [facility] 08/08/2021, 1:30 PM

## 2021-08-08 NOTE — Progress Notes (Signed)
Occupational Therapy Treatment Patient Details Name: Brianna Briggs MRN: [id number] DOB: [birth year] Today's Date: 08/08/2021   History of present illness Pt is a 71 y/o F admitted on 07/27/21 after presenting with c/o nausea & percutaneous drainage. Pt was found to have persistent & increasing abscess in LLQ & intolerance to oral antibiotics. 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 PMH: CAD, dyslipidemia, solitary kidney & recurrent diverticulitis & diverticular abscess, recently admitted multiple times with lower abdominal pain   OT comments  Upon entering the room, pt supine in bed and sleeping soundly. Pt given warm cloth to wash face to alert her for participation in OT intervention.Pt performing bed mobility with increased time but no physical assistance provided.  Pt standing with min guard - min HHA from EOB. She declines sitting in recliner chair. Pt takes steps back towards bed with min A. Use of incentive spirometer with encouragement and cuing for proper technique for 4 reps. Pt's colostomy back appears very full and OT notified staff and pt's RN to address as well as pt reporting she would like to wash up with staff later. All needs within reach and pt remains seated on EOB. She has flat affect throughout and needs encouragement to participate.    Recommendations for follow up therapy are one component of a multi-disciplinary discharge planning process, led by the attending physician.  Recommendations may be updated based on patient status, additional functional criteria and insurance authorization.    Follow Up Recommendations  Skilled nursing-short term rehab (<3 hours/day)    Assistance Recommended at Discharge Frequent or constant Supervision/Assistance  Patient can return home with the following  A little help with walking and/or transfers;A little help with bathing/dressing/bathroom;Help with stairs or ramp for entrance;Assist for  transportation;Assistance with cooking/housework   Equipment Recommendations  Other (comment) (defer to next venue of care)       Precautions / Restrictions Precautions Precautions: Fall Precaution Comments: new R side colostomy bag       Mobility Bed Mobility Overal bed mobility: Modified Independent       Supine to sit: HOB elevated     General bed mobility comments: increased time and use of bedrails to complete tasks but no physical assistance    Transfers Overall transfer level: Needs assistance Equipment used: 1 person hand held assist Transfers: Sit to/from Stand Sit to Stand: Min guard           General transfer comment: Pt refusing transfer to recliner chair but sit <>Stand from EOB with min guard     Balance Overall balance assessment: Needs assistance Sitting-balance support: No upper extremity supported Sitting balance-Leahy Scale: Good Sitting balance - Comments: supervision for dynamic sitting   Standing balance support: Bilateral upper extremity supported, During functional activity Standing balance-Leahy Scale: Fair                             ADL either performed or assessed with clinical judgement   ADL Overall ADL's : Needs assistance/impaired     Grooming: Sitting;Wash/dry face;Supervision/safety                                      Extremity/Trunk Assessment Upper Extremity Assessment Upper Extremity Assessment: Generalized weakness   Lower Extremity Assessment Lower Extremity Assessment: Generalized weakness        Vision Patient Visual  Report: No change from baseline            Cognition Arousal/Alertness: Awake/alert Behavior During Therapy: Flat affect Overall Cognitive Status: Within Functional Limits for tasks assessed                                                     Pertinent Vitals/ Pain       Pain Assessment Pain Assessment: Faces Faces Pain Scale: Hurts  little more Pain Location: R side of abdomen Pain Descriptors / Indicators: Discomfort, Grimacing Pain Intervention(s): Monitored during session, Repositioned         Frequency  Min 2X/week        Progress Toward Goals  OT Goals(current goals can now be found in the care plan section)  Progress towards OT goals: Progressing toward goals  Acute Rehab OT Goals Patient Stated Goal: to get better and go home OT Goal Formulation: With patient Time For Goal Achievement: 08/16/21 Potential to Achieve Goals: Good  Plan Discharge plan remains appropriate;Frequency remains appropriate       AM-PAC OT "6 Clicks" Daily Activity     Outcome Measure   Help from another person eating meals?: None Help from another person taking care of personal grooming?: A Little Help from another person toileting, which includes using toliet, bedpan, or urinal?: A Little Help from another person bathing (including washing, rinsing, drying)?: A Little Help from another person to put on and taking off regular upper body clothing?: A Little Help from another person to put on and taking off regular lower body clothing?: A Little 6 Click Score: 19    End of Session Equipment Utilized During Treatment: Oxygen;Other (comment) (2Ls)  OT Visit Diagnosis: Unsteadiness on feet (R26.81);Muscle weakness (generalized) (M62.81);History of falling (Z91.81);Pain Pain - Right/Left: Right Pain - part of body:  (abdomen)   Activity Tolerance Patient limited by fatigue   Patient Left in bed;with call bell/phone within reach;with bed alarm set   Nurse Communication Mobility status        Time: 4403-4742 OT Time Calculation (min): 18 min  Charges: OT General Charges $OT Visit: 1 Visit OT Treatments $Therapeutic Activity: 8-22 mins  Darleen Crocker, MS, OTR/L , CBIS ascom [phone]  08/08/21, 3:18 PM

## 2021-08-08 NOTE — Progress Notes (Signed)
Patient ID: Brianna Briggs, female   DOB: 06/16/1950, 71 y.o.   MRN: [id number]     Tensed Hospital Day(s): 11.   Interval History: Patient seen and examined, no acute events or new complaints overnight. Patient reports feeling better today.  She endorses that she has improved cough but still with mild cough from the right side of her chest.  Denies any nausea or vomiting.  Endorses having adequate ileostomy output.  Vital signs in last 24 hours: [min-max] current  Temp:  [97.8 F (36.6 C)-98.6 F (37 C)] 98.2 F (36.8 C) (07/24 0802) Pulse Rate:  [55-77] 72 (07/24 0802) Resp:  [16-18] 17 (07/24 0802) BP: (112-124)/(57-76) 124/68 (07/24 0802) SpO2:  [89 %-95 %] 90 % (07/24 0802) Weight:  [53.7 kg] 53.7 kg (07/24 0500)     Height: '5\' 2"'$  (157.5 cm) Weight: 53.7 kg BMI (Calculated): 21.65   Physical Exam:  Constitutional: alert, cooperative and no distress  Respiratory: breathing non-labored at rest  Cardiovascular: regular rate and sinus rhythm  Gastrointestinal: soft, non-tender, and non-distended.  Ileostomy pink and patent  Labs:     Latest Ref Rng & Units 08/08/2021    4:55 AM 08/07/2021    4:25 AM 08/06/2021    5:31 AM  CBC  WBC 4.0 - 10.5 K/uL 3.5  3.3  3.5   Hemoglobin 12.0 - 15.0 g/dL 11.3  10.8  11.0   Hematocrit 36.0 - 46.0 % 36.0  34.3  34.7   Platelets 150 - 400 K/uL 261  252  212       Latest Ref Rng & Units 08/08/2021    4:55 AM 08/07/2021    4:25 AM 08/06/2021    5:31 AM  CMP  Glucose 70 - 99 mg/dL 113  124  117   BUN 8 - 23 mg/dL '20  17  12   '$ Creatinine 0.44 - 1.00 mg/dL 0.56  0.57  0.43   Sodium 135 - 145 mmol/L 144  141  140   Potassium 3.5 - 5.1 mmol/L 3.8  3.7  3.6   Chloride 98 - 111 mmol/L 99  95  95   CO2 22 - 32 mmol/L 36  38  36   Calcium 8.9 - 10.3 mg/dL 8.8  8.5  8.5   Total Protein 6.5 - 8.1 g/dL 6.3  6.0  6.0   Total Bilirubin 0.3 - 1.2 mg/dL 0.6  0.7  0.8   Alkaline Phos 38 - 126 U/L 36  31  33   AST 15 - 41 U/L 32  29   30   ALT 0 - 44 U/L '15  13  14     '$ Imaging studies: No new pertinent imaging studies   Assessment/Plan:  71 y.o. female with complaint of diverticulitis 8 Day Post-Op s/p partial colectomy with anastomosis and loop ileostomy creation.   -Stable vital signs, no fever. -Continue having adequate function of the ileostomy -Tolerating diet -Encourage patient to get out of bed and ambulate.   COVID-19 -Patient diagnosed with COVID-19.   -Completed treatment -Clinically improved -Also she is smoker and COPD as per Dr. Raul Del note  -Management as per hospitalist  Arnold Long, MD

## 2021-08-08 NOTE — Progress Notes (Signed)
PT Cancellation Note  Patient Details Name: Brianna Briggs MRN: [id number] DOB: 01-14-1951   Cancelled Treatment:     Therapist in to see pt this pm. Pt resting in bed, had recently worked with OT and plans to get washed up after dinner. Pt educated on the benefits of mobility and regaining strength, however she refused any activity, stating she was too fatigued to participate.  Continue PT per POC next available date/time   Josie Dixon 08/08/2021, 3:56 PM

## 2021-08-08 NOTE — TOC Progression Note (Signed)
Transition of Care [facility]) - Progression Note    Patient Details  Name: Brianna Briggs MRN: [id number] Date of Birth: [birth year]  Transition of [facility]) CM/SW [place], RN Phone Number: 08/08/2021, 9:12 AM  Clinical Narrative:   Continues to await COVID clearance for SNF.    Expected Discharge Plan:  (TBD) Barriers to Discharge: Continued Medical Work up  Expected Discharge Plan and Services Expected Discharge Plan:  (TBD)   Discharge Planning Services: CM Consult   Living arrangements for the past 2 months: Single Family Home                                       Social Determinants of Health (SDOH) Interventions    Readmission Risk Interventions    07/28/2021    4:14 PM  Readmission Risk Prevention Plan  Transportation Screening Complete  PCP or Specialist Appt within 5-7 Days Complete  Home Care Screening Complete  Medication Review (RN CM) Complete

## 2021-08-09 DIAGNOSIS — J9621 Acute and chronic respiratory failure with hypoxia: Secondary | ICD-10-CM | POA: Diagnosis not present

## 2021-08-09 DIAGNOSIS — U071 COVID-19: Secondary | ICD-10-CM | POA: Diagnosis not present

## 2021-08-09 NOTE — Progress Notes (Signed)
Progress Note   Patient: Brianna Briggs JLU:[id number] DOB: 18-Jul-1950 DOA: 07/27/2021     12 DOS: the patient was seen and examined on 08/09/2021   Brief hospital course: Brianna Briggs is a 71 yo female with PMH 71 year old with history of CAD, HLD, solitary kidney, recurrent diverticulitis with abscess who presented with epigastric pain.  She had been on outpatient antibiotics with minimal improvement. CT abdomen/pelvis on admission showed persistent abscess attributed to acute sigmoid diverticulitis. She had initially been treated with percutaneous drainage and IV antibiotics as well. She was followed by general surgery and ultimately underwent sigmoid resection with anastomosis and loop ileostomy creation.  She had persistent lethargy and poor appetite after surgery.  She had learned that her husband was diagnosed with COVID and had also been visiting her in the hospital.  She underwent COVID-19 testing on 08/04/2021 which was also positive.  She was treated with a course of remdesivir and steroids given her worsening oxygen requirement from her baseline as well.    Assessment and Plan: * Sepsis due to diverticulitis with abscess (HCC)-resolved as of 08/04/2021 - Patient meets sepsis criteria with increased heart rate, leukocytosis, source of intra-abdominal abscess -S/p treatment with percutaneous drainage and prior IV antibiotic course - Now has undergone sigmoid resection with loop ileostomy creation - Antibiotics have now been discontinued  Diverticulitis of intestine with abscess Complicated diverticulitis with abscess.  Status post sigmoid resection with loop ileostomy creation on 07/31/2021. --Diet has been advanced to soft diet per surgery --Continue monitoring oral intake; needs improvement - Continue nausea control - Continue monitoring ileostomy output; adequate  COVID-19 virus infection Patient's husband recently diagnosed after visiting her.  Pt tested on 7/20 and found to  be positive. May contribute to the persistent lethargy, poor appetite, slightly increased O2 demands since surgery. - completed Remdesivir 7/20 x 3 days - continue steroids given acute on chronic worsened hypoxia  - continue mucinex and incentive spirometery  Abdominal pain Patient continues to have ongoing pain s/p surgery. -Continue Norco, dilaudid for breakthrough pain  Acute on chronic respiratory failure with hypoxia ([place]) Recently started on nocturnal O2 at home ~2L but starting to have increased O2 demands on 7/18. Suspect some atelectasis postoperatively. CXR on 7/19 shows atelectasis and some small effusions Treated with Lasix x 1 on 7/19. -- Continue encouraging PO intake  -- Tested +Covid-19, likely contributes --Now weaned back down to baseline 2 L --Continue incentive spirometry  MDD (major depressive disorder) Per Dr. Sabino Briggs: "Spoke with patient at length bedside this morning.  She does have a history of depression and has been treated with medications in the past.  She could not recall which names but thinks 1 was Wellbutrin as she was also on it to help with smoking cessation. - specifically denies SI/HI - situationally she has lots of stressors in life that are persistent and she does meet some criteria to consider treatment (endorses difficulty with sleep, decreased energy, decreased appetite); given her prior percutaneous drain, this was also playing a role in some of her depression and also decreased ability to eat much and therefore had less energy.  After discussion, she is declining starting on medication at this time but understands if she changes her mind, this would be considered appropriate and reasonable" --Continue to monitor   Malnutrition of moderate degree Body mass index is 20.56 kg/m. As evidenced by signs/symptoms consistent with PCM - fat loss, muscle loss. -- RD consulted, appreciate assistance. -- Continue MVI, Ensure  Perforated  diverticulum of  intestine As outlined.  Chronic obstructive pulmonary disease (COPD) (HCC) Stable with no signs or symptoms of exacerbation. --Continue albuterol as needed --Patient quit smoking about 3 weeks ago  Tobacco abuse - Nicotine patch as needed ordered for nicotine craving - Counseled patient that tobacco abuse also decreases wound healing  Hyperlipidemia, mixed - hold statin for now         Subjective: Patient awake resting in bed when seen on rounds today.  She reports ongoing fatigue and generalized weakness with poor appetite.  She has had a lot of stressors related to her hospitalization and illnesses in addition to social issues in the family including daughter who had been abused and has been now arrested and daughter does not have any place to go.  Physical Exam: Vitals:   08/08/21 1932 08/09/21 0328 08/09/21 0329 08/09/21 0926  BP: 113/65 117/65  (!) 103/58  Pulse: (!) 51 68  65  Resp: '16 16  20  ' Temp: 98.3 F (36.8 C) 98.4 F (36.9 C)  98.2 F (36.8 C)  TempSrc:      SpO2: 92% 90%  92%  Weight:   51 kg   Height:       General exam: awake, alert, no acute distress, frail HEENT: Nasal cannula in place, moist mucus membranes, hearing grossly normal  Respiratory system: CTAB, no wheezes, rales or rhonchi, normal respiratory effort. Cardiovascular system: normal S1/S2, RRR, no pedal edema.   Gastrointestinal system: Ostomy present on the right side abdomen with liquid appearing stool in bag, midline surgical incision with staples intact and no signs of surrounding erythema induration or swelling was is healing well abdomen soft and nondistended Central nervous system: A&O x3. no gross focal neurologic deficits, normal speech Extremities: moves all, no edema, normal tone Skin: dry, intact, normal temperature, normal color, No rashes, lesions or ulcers Psychiatry: normal mood, congruent affect, judgement and insight appear normal   Data Reviewed:  Notable labs: Notable  labs: Bicarb 36, glucose 113, calcium 8.8, alk phos 36, albumin 2.8, total protein 6.3, CRP improving 3.3, WBC 3.5, hemoglobin stable 11.3   Family Communication: none  Disposition: Status is: Inpatient Remains inpatient appropriate because: Requires SNF placement after 10 days isolation for Covid.    Planned Discharge Destination: Skilled nursing facility    Time spent: 35 minutes  Author: Ezekiel Slocumb, DO 08/09/2021 2:23 PM  For on call review [email or web address].

## 2021-08-09 NOTE — Progress Notes (Signed)
Patient ID: Brianna Briggs, female   DOB: [birth year], 71 y.o.   MRN: [id number]     [place] Hospital Day(s): 12.   Interval History: Patient seen and examined, no acute events or new complaints overnight. Patient reports not feeling comfortable.  She continued having abdominal pain.  She feels that the pain medication because her worsening abdominal pain.  She denies any nausea or vomiting.  She denies any problem ileostomy.  There has been no fever.  Vital signs in last 24 hours: [min-max] current  Temp:  [98.2 F (36.8 C)-98.4 F (36.9 C)] 98.2 F (36.8 C) (07/25 0926) Pulse Rate:  [51-68] 65 (07/25 0926) Resp:  [16-20] 20 (07/25 0926) BP: (103-117)/(58-65) 103/58 (07/25 0926) SpO2:  [90 %-92 %] 92 % (07/25 0926) Weight:  [51 kg] 51 kg (07/25 0329)     Height: '5\' 2"'$  (157.5 cm) Weight: 51 kg BMI (Calculated): 20.56   Physical Exam:  Constitutional: alert, cooperative and no distress  Respiratory: breathing non-labored at rest  Cardiovascular: regular rate and sinus rhythm  Gastrointestinal: soft, non-tender, and non-distended.  Ileostomy pink and patent.  Labs:     Latest Ref Rng & Units 08/08/2021    4:55 AM 08/07/2021    4:25 AM 08/06/2021    5:31 AM  CBC  WBC 4.0 - 10.5 K/uL 3.5  3.3  3.5   Hemoglobin 12.0 - 15.0 g/dL 11.3  10.8  11.0   Hematocrit 36.0 - 46.0 % 36.0  34.3  34.7   Platelets 150 - 400 K/uL 261  252  212       Latest Ref Rng & Units 08/08/2021    4:55 AM 08/07/2021    4:25 AM 08/06/2021    5:31 AM  CMP  Glucose 70 - 99 mg/dL 113  124  117   BUN 8 - 23 mg/dL '20  17  12   '$ Creatinine 0.44 - 1.00 mg/dL 0.56  0.57  0.43   Sodium 135 - 145 mmol/L 144  141  140   Potassium 3.5 - 5.1 mmol/L 3.8  3.7  3.6   Chloride 98 - 111 mmol/L 99  95  95   CO2 22 - 32 mmol/L 36  38  36   Calcium 8.9 - 10.3 mg/dL 8.8  8.5  8.5   Total Protein 6.5 - 8.1 g/dL 6.3  6.0  6.0   Total Bilirubin 0.3 - 1.2 mg/dL 0.6  0.7  0.8   Alkaline Phos 38 - 126 U/L 36  31   33   AST 15 - 41 U/L 32  29  30   ALT 0 - 44 U/L '15  13  14     '$ Imaging studies: No new pertinent imaging studies   Assessment/Plan:  71 y.o. female with complaint of diverticulitis 9 Day Post-Op s/p partial colectomy with anastomosis and loop ileostomy creation.   -There has been no fever, tachycardia.  Vital signs stable -Continue having adequate function of the ileostomy -Tolerating diet -Today more comfortable than usual.  She was getting much better yesterday but today she looks uncomfortable.  We will continue with pain management.  Encourage the patient to ambulate.   COVID-19 -Patient diagnosed with COVID-19.   -Completed treatment -Clinically improved -Also she is smoker and COPD as per Dr. Raul Del note  -Management as per hospitalist  Arnold Long, MD

## 2021-08-09 NOTE — Consult Note (Signed)
[place] Nurse ostomy follow up Stoma type/location: RLQ, loop ileostomy  Stomal assessment/size: 1 1/2" slightly oblong now Peristomal assessment: intact  Treatment options for stomal/peristomal skin: 2" skin barrier ring  Output gelatinous green stool  Ostomy pouching: 2pc. 2 1/4" with 2" skin barrier ring  Education provided: NA; offiered to allow patient to empty, she refused. Demonstrated pouch change. Patient states "how would anyone do this at home" encouraged patient that this care is certainly something done by patients at home. She is still reluctant and states "she can not see it because of neck issues" encouraged patient again to empty and participate.  Enrolled patient in [place] Start Discharge program: Yes  [place] Nurse will follow along with you for continued support with ostomy teaching and care [place] MSN, [place], [place], [place], [place]

## 2021-08-09 NOTE — Assessment & Plan Note (Signed)
As outlined  

## 2021-08-10 DIAGNOSIS — K5720 Diverticulitis of large intestine with perforation and abscess without bleeding: Secondary | ICD-10-CM | POA: Diagnosis not present

## 2021-08-10 DIAGNOSIS — U071 COVID-19: Secondary | ICD-10-CM | POA: Diagnosis not present

## 2021-08-10 DIAGNOSIS — J449 Chronic obstructive pulmonary disease, unspecified: Secondary | ICD-10-CM | POA: Diagnosis not present

## 2021-08-10 MED ORDER — PROSOURCE PLUS PO LIQD
30.0000 mL | Freq: Three times a day (TID) | ORAL | Status: DC
  Administered 2021-08-10 (×2): 30 mL via ORAL
  Filled 2021-08-10: qty 30

## 2021-08-10 MED ORDER — PREDNISONE 20 MG PO TABS
40.0000 mg | ORAL_TABLET | Freq: Every day | ORAL | Status: AC
  Administered 2021-08-11: 40 mg via ORAL
  Filled 2021-08-10: qty 2

## 2021-08-10 MED ORDER — PREDNISONE 20 MG PO TABS
30.0000 mg | ORAL_TABLET | Freq: Every day | ORAL | Status: AC
Start: 2021-08-12 — End: 2021-08-12
  Administered 2021-08-12: 30 mg via ORAL
  Filled 2021-08-10: qty 1

## 2021-08-10 MED ORDER — PREDNISONE 10 MG PO TABS
10.0000 mg | ORAL_TABLET | Freq: Every day | ORAL | Status: AC
Start: 2021-08-14 — End: 2021-08-14
  Administered 2021-08-14: 10 mg via ORAL
  Filled 2021-08-10: qty 1

## 2021-08-10 MED ORDER — PREDNISONE 20 MG PO TABS
20.0000 mg | ORAL_TABLET | Freq: Every day | ORAL | Status: AC
  Administered 2021-08-13: 20 mg via ORAL
  Filled 2021-08-10: qty 1

## 2021-08-10 NOTE — Progress Notes (Signed)
Nutrition Follow-up  DOCUMENTATION CODES:   Non-severe (moderate) malnutrition in context of chronic illness  INTERVENTION:   -Liberalize diet to regular for wider variety of meal selections -Continue MVI with minerals daily -Continue Carnation Instant Breakfast with meals -30 ml Prosource Plus TID, each supplement provides 100 kcals and 15 grams protein  NUTRITION DIAGNOSIS:   Moderate Malnutrition related to chronic illness, catabolic illness (tobacco use, COPD, SVT, diverticulitis) as evidenced by moderate fat depletion, moderate muscle depletion, energy intake < 75% for > or equal to 1 month.  Ongoing  GOAL:   Patient will meet greater than or equal to 90% of their needs  Progressing   MONITOR:   Diet advancement, Labs, Weight trends, Skin, I & O's  REASON FOR ASSESSMENT:   Malnutrition Screening Tool    ASSESSMENT:   71 y/o female with h/o COPD, HLD, breast cancer s/p mastectomy, CAD and diverticulitis s/p IR drain (may) who is admitted with recurrent diverticulitis with abscess.  7/16- s/p Procedure: Sigmoid colon resection with anastomosis                      Mobilization of splenic flexure                      ileostomy creation 7/17- diet advanced to full liquids 7/18- diet advanced to soft 7/20- COVID +  Reviewed I/O's: -300 ml x 24 hours and +1.7 L since admission  UOP: 150 ml x 24 hours  Stool output: 150 ml x 24 hours   Pt receiving nursing care at time of visit.   Pt reports continued pain, including abdominal pain, which she suspects is being aggravated by pain medications. She is reluctant to participate in ostomy care.   No meal completions documented since last visit. Pt does not like Ensure or Boost supplements; Carnation Instant Breakfast ordered per her request.   Pt currently on a heart healthy iuet; will liberalize for wider variety of meal selections.   Plan for SNF at discharge; awaiting medical clearance with COVID-19 prior to  discharge to SNF.   Reviewed wt hx; pt has experienced a 6.2% wt loss over the past week, which is significant for time frame.   Medications reviewed and include prednisone.   Labs reviewed: CBGS: 88.   Diet Order:   Diet Order             Diet Heart Room service appropriate? Yes; Fluid consistency: Thin  Diet effective now                   EDUCATION NEEDS:   Education needs have been addressed  Skin:  Skin Assessment: Skin Integrity Issues: Skin Integrity Issues:: Incisions Incisions: closed abdomen  Last BM:  08/03/21 (via colostomy)  Height:   Ht Readings from Last 1 Encounters:  07/27/21 '5\' 2"'$  (1.575 m)    Weight:   Wt Readings from Last 1 Encounters:  08/10/21 51.2 kg   BMI:  Body mass index is 20.65 kg/m.  Estimated Nutritional Needs:   Kcal:  1740-8144  Protein:  100-115 grams  Fluid:  > 1.7 L    Loistine Chance, RD, LDN, [place] Registered Dietitian II Certified Diabetes Care and Education Specialist Please refer to [facility] for RD and/or RD on-call/weekend/after hours pager

## 2021-08-10 NOTE — Progress Notes (Signed)
Occupational Therapy Treatment Patient Details Name: Brianna Briggs MRN: [id number] DOB: [birth year] Today's Date: 08/10/2021   History of present illness Pt is a 71 y/o F admitted on 07/27/21 after presenting with c/o nausea & percutaneous drainage. Pt was found to have persistent & increasing abscess in LLQ & intolerance to oral antibiotics. 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 PMH: CAD, dyslipidemia, solitary kidney & recurrent diverticulitis & diverticular abscess, recently admitted multiple times with lower abdominal pain   OT comments  Brianna Briggs was seen for OT treatment on this date. Upon arrival to room pt sidelying in bed c/o stomach pain, agreeable to tx. Pt requires SBA + RW for BSC t/f and grooming standing sink side. Tolerated ~20 ft mobility prior to requesting seated rest break, O2 stable on baseline 2L [place]. Pt continues to be limited by poor activity tolerance. Pt making good progress toward goals, will continue to follow POC. Discharge recommendation remains appropriate.    Recommendations for follow up therapy are one component of a multi-disciplinary discharge planning process, led by the attending physician.  Recommendations may be updated based on patient status, additional functional criteria and insurance authorization.    Follow Up Recommendations  Skilled nursing-short term rehab (<3 hours/day)    Assistance Recommended at Discharge Frequent or constant Supervision/Assistance  Patient can return home with the following  A little help with walking and/or transfers;A little help with bathing/dressing/bathroom;Help with stairs or ramp for entrance;Assist for transportation;Assistance with cooking/housework   Equipment Recommendations  BSC/3in1    Recommendations for Other Services      Precautions / Restrictions Precautions Precautions: Fall Restrictions Weight Bearing Restrictions: No       Mobility Bed Mobility Overal bed mobility:  Modified Independent             General bed mobility comments: increased time    Transfers Overall transfer level: Needs assistance Equipment used: Rolling walker (2 wheels) Transfers: Sit to/from Stand Sit to Stand: Min guard                 Balance Overall balance assessment: Needs assistance Sitting-balance support: No upper extremity supported Sitting balance-Leahy Scale: Good     Standing balance support: No upper extremity supported, During functional activity Standing balance-Leahy Scale: Fair                             ADL either performed or assessed with clinical judgement   ADL Overall ADL's : Needs assistance/impaired                                       General ADL Comments: SBA + RW for BSC t/f and grooming standing sink side. Tolerated ~20 ft mobility prior to requesting seated rest break      Cognition Arousal/Alertness: Awake/alert Behavior During Therapy: Flat affect Overall Cognitive Status: Within Functional Limits for tasks assessed                                 General Comments: reports generalized fatigue however states "I have to try"                   Pertinent Vitals/ Pain       Pain Assessment Pain Assessment: Faces Faces Pain Scale:  Hurts little more Pain Location: stomach Pain Descriptors / Indicators: Discomfort, Grimacing Pain Intervention(s): Limited activity within patient's tolerance, Repositioned   Frequency  Min 2X/week        Progress Toward Goals  OT Goals(current goals can now be found in the care plan section)  Progress towards OT goals: Progressing toward goals  Acute Rehab OT Goals Patient Stated Goal: to feel better OT Goal Formulation: With patient Time For Goal Achievement: 08/16/21 Potential to Achieve Goals: Good ADL Goals Pt Will Perform Grooming: with modified independence Pt Will Perform Lower Body Dressing: with modified independence Pt  Will Transfer to Toilet: with modified independence Pt Will Perform Toileting - Clothing Manipulation and hygiene: with modified independence  Plan Discharge plan remains appropriate;Frequency remains appropriate    Co-evaluation                 AM-PAC OT "6 Clicks" Daily Activity     Outcome Measure   Help from another person eating meals?: None Help from another person taking care of personal grooming?: A Little Help from another person toileting, which includes using toliet, bedpan, or urinal?: A Little Help from another person bathing (including washing, rinsing, drying)?: A Little Help from another person to put on and taking off regular upper body clothing?: A Little Help from another person to put on and taking off regular lower body clothing?: A Little 6 Click Score: 19    End of Session Equipment Utilized During Treatment: Rolling walker (2 wheels);Oxygen  OT Visit Diagnosis: Unsteadiness on feet (R26.81);Muscle weakness (generalized) (M62.81);History of falling (Z91.81)   Activity Tolerance Patient tolerated treatment well   Patient Left in bed (seated EOB with PT)   Nurse Communication Mobility status        Time: 6568-1275 OT Time Calculation (min): 15 min  Charges: OT General Charges $OT Visit: 1 Visit OT Treatments $Self Care/Home Management : 8-22 mins  Brianna Briggs, M.S. OTR/L  08/10/21, 3:02 PM  ascom [phone]

## 2021-08-10 NOTE — Progress Notes (Signed)
Patient ID: Brianna Briggs, female   DOB: 13-Jun-1950, 71 y.o.   MRN: [id number]     [place] Hospital Day(s): 13.   Interval History: Patient seen and examined, no acute events or new complaints overnight. Patient reports feeling a little bit better than yesterday.  Still with not a lot of appetite.  Chronic abdominal pain.  Endorses she continues to be depressed.  Vital signs in last 24 hours: [min-max] current  Temp:  [97.3 F (36.3 C)-98.1 F (36.7 C)] 98.1 F (36.7 C) (07/26 0756) Pulse Rate:  [60-77] 60 (07/26 0756) Resp:  [16-19] 16 (07/26 0756) BP: (113-116)/(70-83) 116/83 (07/26 0756) SpO2:  [93 %-96 %] 96 % (07/26 0756) Weight:  [51.2 kg] 51.2 kg (07/26 0500)     Height: '5\' 2"'$  (157.5 cm) Weight: 51.2 kg BMI (Calculated): 20.64   Physical Exam:  Constitutional: alert, cooperative and no distress  Respiratory: breathing non-labored at rest  Cardiovascular: regular rate and sinus rhythm  Gastrointestinal: soft, non-tender, and non-distended.  Ileostomy pink and patent.  Labs:     Latest Ref Rng & Units 08/08/2021    4:55 AM 08/07/2021    4:25 AM 08/06/2021    5:31 AM  CBC  WBC 4.0 - 10.5 K/uL 3.5  3.3  3.5   Hemoglobin 12.0 - 15.0 g/dL 11.3  10.8  11.0   Hematocrit 36.0 - 46.0 % 36.0  34.3  34.7   Platelets 150 - 400 K/uL 261  252  212       Latest Ref Rng & Units 08/08/2021    4:55 AM 08/07/2021    4:25 AM 08/06/2021    5:31 AM  CMP  Glucose 70 - 99 mg/dL 113  124  117   BUN 8 - 23 mg/dL '20  17  12   '$ Creatinine 0.44 - 1.00 mg/dL 0.56  0.57  0.43   Sodium 135 - 145 mmol/L 144  141  140   Potassium 3.5 - 5.1 mmol/L 3.8  3.7  3.6   Chloride 98 - 111 mmol/L 99  95  95   CO2 22 - 32 mmol/L 36  38  36   Calcium 8.9 - 10.3 mg/dL 8.8  8.5  8.5   Total Protein 6.5 - 8.1 g/dL 6.3  6.0  6.0   Total Bilirubin 0.3 - 1.2 mg/dL 0.6  0.7  0.8   Alkaline Phos 38 - 126 U/L 36  31  33   AST 15 - 41 U/L 32  29  30   ALT 0 - 44 U/L '15  13  14     '$ Imaging  studies: No new pertinent imaging studies   Assessment/Plan:  71 y.o. female with complaint of diverticulitis 10 Day Post-Op s/p partial colectomy with anastomosis and loop ileostomy creation.  -There has been no fever, tachycardia.  Vital signs stable -Continue having adequate function of the ileostomy -Tolerating diet but not eating much.  I told patient that they can bring food from outside if not will make her a better.    COVID-19 -Patient diagnosed with COVID-19.   -Completed treatment -Clinically improved -Also she is smoker and COPD as per Dr. Raul Del note  -Management as per hospitalist -Pending to complete quarantine to be able to go to screening facility.  Arnold Long, MD

## 2021-08-10 NOTE — TOC Progression Note (Signed)
Transition of Care [facility]) - Progression Note    Patient Details  Name: Brianna Briggs MRN: [id number] Date of Birth: 1950-04-26  Transition of Care [facility]) CM/SW [place], RN Phone Number: 08/10/2021, 3:40 PM  Clinical Narrative:   Patient was positive for COVID on 08/04/2021, patient can transfer on day 11 after positive test, which will be 08/15/2021, Monday.  [place] aware.    Expected Discharge Plan:  (TBD) Barriers to Discharge: Continued Medical Work up  Expected Discharge Plan and Services Expected Discharge Plan:  (TBD)   Discharge Planning Services: CM Consult   Living arrangements for the past 2 months: Single Family Home                                       Social Determinants of Health (SDOH) Interventions    Readmission Risk Interventions    07/28/2021    4:14 PM  Readmission Risk Prevention Plan  Transportation Screening Complete  PCP or Specialist Appt within 5-7 Days Complete  Home Care Screening Complete  Medication Review (RN CM) Complete

## 2021-08-10 NOTE — Progress Notes (Signed)
Progress Note   Patient: Brianna Briggs ZOX:[id number] DOB: 09/07/1950 DOA: 07/27/2021     13 DOS: the patient was seen and examined on 08/10/2021   Brief hospital course: [name] is a 71 yo female with PMH 71 year old with history of CAD, HLD, solitary kidney, recurrent diverticulitis with abscess who presented with epigastric pain.  She had been on outpatient antibiotics with minimal improvement. CT abdomen/pelvis on admission showed persistent abscess attributed to acute sigmoid diverticulitis. She had initially been treated with percutaneous drainage and IV antibiotics as well. She was followed by general surgery and ultimately underwent sigmoid resection with anastomosis and loop ileostomy creation.  She had persistent lethargy and poor appetite after surgery.  She had learned that her husband was diagnosed with COVID and had also been visiting her in the hospital.  She underwent COVID-19 testing on 08/04/2021 which was also positive.  She was treated with a course of remdesivir and steroids given her worsening oxygen requirement from her baseline as well.    Assessment and Plan: * Sepsis due to diverticulitis with abscess (HCC)-resolved as of 08/04/2021 - Patient meets sepsis criteria with increased heart rate, leukocytosis, source of intra-abdominal abscess -S/p treatment with percutaneous drainage and prior IV antibiotic course - Now has undergone sigmoid resection with loop ileostomy creation - Antibiotics have now been discontinued  Diverticulitis of intestine with abscess Complicated diverticulitis with abscess.  Status post sigmoid resection with loop ileostomy creation on 07/31/2021. --Diet has been advanced to soft diet per surgery --Continue monitoring oral intake; needs improvement - Continue nausea control - Continue monitoring ileostomy output; adequate -- WOC following for ileostomy -- Continue education and training patient for self-management of  ileostomy  COVID-19 virus infection Patient's husband recently diagnosed after visiting her.  Pt tested on 7/20 and found to be positive. May contribute to the persistent lethargy, poor appetite, slightly increased O2 demands since surgery. - completed Remdesivir 7/20 x 3 days - transition IV to PO steroids and taper off  - mucinex  - incentive spirometery  Abdominal pain Patient continues to have ongoing pain s/p surgery. -Continue Norco, dilaudid for breakthrough pain  Acute on chronic respiratory failure with hypoxia ([place]) Recently started on nocturnal O2 at home ~2L but starting to have increased O2 demands on 7/18. Suspect some atelectasis postoperatively. CXR on 7/19 shows atelectasis and some small effusions Treated with Lasix x 1 on 7/19. -- Continue encouraging PO intake  -- Tested +Covid-19, likely contributes --Now weaned back down to baseline 2 L --Continue incentive spirometry  MDD (major depressive disorder) Per Dr. Sabino Gasser: "Spoke with patient at length bedside this morning.  She does have a history of depression and has been treated with medications in the past.  She could not recall which names but thinks 1 was Wellbutrin as she was also on it to help with smoking cessation. - specifically denies SI/HI - situationally she has lots of stressors in life that are persistent and she does meet some criteria to consider treatment (endorses difficulty with sleep, decreased energy, decreased appetite); given her prior percutaneous drain, this was also playing a role in some of her depression and also decreased ability to eat much and therefore had less energy.  After discussion, she is declining starting on medication at this time but understands if she changes her mind, this would be considered appropriate and reasonable" --Continue to monitor   Malnutrition of moderate degree Body mass index is 20.56 kg/m. As evidenced by signs/symptoms consistent with PCM -  fat loss,  muscle loss. -- RD consulted, appreciate assistance. -- Continue MVI, Ensure  Perforated diverticulum of intestine As outlined.  Chronic obstructive pulmonary disease (COPD) (HCC) Stable with no signs or symptoms of exacerbation. --Continue albuterol as needed --Patient quit smoking about 3 weeks ago  Tobacco abuse - Nicotine patch as needed ordered for nicotine craving - Counseled patient that tobacco abuse also decreases wound healing  Hyperlipidemia, mixed - hold statin for now         Subjective: Patient awake resting in bed when seen on rounds today, getting up to use bedside commode. She reports ongoing very poor appetite, states the thought of eating anything makes her nauseous.  Denies vomiting, F/C, SOB, CP or other complaints.    Physical Exam: Vitals:   08/09/21 2015 08/10/21 0445 08/10/21 0500 08/10/21 0756  BP: 114/75 113/70  116/83  Pulse: 77 70  60  Resp: _0 Temp: (!) 97.3 F (36.3 C) 98 F (36.7 C)  98.1 F (36.7 C)  TempSrc:    Oral  SpO2: 95% 93%  96%  Weight:   51.2 kg   Height:       General exam: awake, alert, no acute distress, frail HEENT: Nasal cannula in place, moist mucus membranes, hearing grossly normal  Respiratory system: on 2 L/min [place] O2, normal respiratory effort. Cardiovascular system: RRR, no pedal edema.   Gastrointestinal system: abdomen soft non-distended, ostomy present on the right side with liquid stool in bag, midline surgical incision with staples intact and no signs of surrounding erythema induration or swelling Central nervous system: A&O x3. no gross focal neurologic deficits, normal speech Extremities: moves all, no edema, normal tone Skin: dry, no rashes, lesions or ulcers seen on visualized skin Psychiatry: depressed mood, congruent affect   Data Reviewed:  Notable labs: Notable labs: Bicarb 36, glucose 113, calcium 8.8, alk phos 36, albumin 2.8, total protein 6.3, CRP improving 3.3, WBC 3.5, hemoglobin  stable 11.3   Family Communication: none  Disposition: Status is: Inpatient Remains inpatient appropriate because: Requires SNF placement after 10 days isolation for Covid.    Planned Discharge Destination: Skilled nursing facility    Time spent: 35 minutes  Author: Ezekiel Slocumb, DO 08/10/2021 12:34 PM  For on call review [email or web address].

## 2021-08-10 NOTE — Progress Notes (Signed)
Physical Therapy Treatment Patient Details Name: Brianna Briggs MRN: [id number] DOB: [birth year] Today's Date: 08/10/2021   History of Present Illness Pt is a 71 y/o F admitted on 07/27/21 after presenting with c/o nausea & percutaneous drainage. Pt was found to have persistent & increasing abscess in LLQ & intolerance to oral antibiotics. 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 PMH: CAD, dyslipidemia, solitary kidney & recurrent diverticulitis & diverticular abscess, recently admitted multiple times with lower abdominal pain    PT Comments    Pt had just finished OT upon author arriving. She is A but presents with flat affect. Was able to consistently follow commands and is motivated but is severely limited by fatigue. She was only able to ambulate to doorway of room prior to becoming slightly SOB and requesting to return to bed. Fatigue greatly limits pt's abilities. Chief Strategy Officer and patient discussed at length DC disposition. Both pt and author feels DC to SNF is best/safest option. She is far from baseline and will need STR to address deficits while maximizing independence with ADLs. Acute PT will continue to follow and progress as able per current POC.     Recommendations for follow up therapy are one component of a multi-disciplinary discharge planning process, led by the attending physician.  Recommendations may be updated based on patient status, additional functional criteria and insurance authorization.  Follow Up Recommendations  Skilled nursing-short term rehab (<3 hours/day) Can patient physically be transported by private vehicle: No   Assistance Recommended at Discharge Frequent or constant Supervision/Assistance  Patient can return home with the following A lot of help with bathing/dressing/bathroom;Assistance with cooking/housework;Assist for transportation;Help with stairs or ramp for entrance;A lot of help with walking and/or transfers   Equipment  Recommendations  Rolling walker (2 wheels);BSC/3in1       Precautions / Restrictions Precautions Precautions: Fall Precaution Comments: new R side colostomy bag Restrictions Weight Bearing Restrictions: No     Mobility  Bed Mobility Overal bed mobility: Needs Assistance Bed Mobility: Supine to Sit Rolling: Supervision  Supine to sit: Supervision Sit to supine: Min guard   Transfers Overall transfer level: Needs assistance Equipment used: Rolling walker (2 wheels) Transfers: Sit to/from Stand Sit to Stand: Min guard     Ambulation/Gait Ambulation/Gait assistance: Min guard Gait Distance (Feet): 20 Feet Assistive device: Rolling walker (2 wheels) Gait Pattern/deviations: Decreased step length - right, Decreased step length - left, Decreased stride length Gait velocity: decreased     General Gait Details: pt fatigues extremely quickly with activity. vitals stable but SOB noted with minimal activity    Balance Overall balance assessment: Needs assistance Sitting-balance support: No upper extremity supported Sitting balance-Leahy Scale: Good     Standing balance support: No upper extremity supported, During functional activity Standing balance-Leahy Scale: Fair         Cognition Arousal/Alertness: Awake/alert Behavior During Therapy: Flat affect Overall Cognitive Status: Within Functional Limits for tasks assessed      General Comments: pt is cooperative and able to easily follow commands throughout. Somewhat questions pt's total understanding of situation.           General Comments General comments (skin integrity, edema, etc.): discussed DC disposition. Pt lives with spouse who also currently has + covid. she reports he will not be able to perform colostomy care and pt has yet to demonstrate abilityt o perform independently. pt is severely deconditioned and far from baseline. does not have functioning AC currently. Pryor Curia feels DC to  SNF is best option to  maximize independence with all ADLs.      Pertinent Vitals/Pain Pain Assessment Pain Assessment: 0-10 Pain Score: 6  Pain Location: stomach Pain Descriptors / Indicators: Discomfort, Grimacing Pain Intervention(s): Limited activity within patient's tolerance, Monitored during session, Premedicated before session, Repositioned     PT Goals (current goals can now be found in the care plan section) Acute Rehab PT Goals Patient Stated Goal: less pain and move better Progress towards PT goals: Progressing toward goals    Frequency    Min 2X/week      PT Plan Current plan remains appropriate       AM-PAC PT "6 Clicks" Mobility   Outcome Measure  Help needed turning from your back to your side while in a flat bed without using bedrails?: A Little Help needed moving from lying on your back to sitting on the side of a flat bed without using bedrails?: A Little Help needed moving to and from a bed to a chair (including a wheelchair)?: A Little Help needed standing up from a chair using your arms (e.g., wheelchair or bedside chair)?: A Little Help needed to walk in hospital room?: A Little Help needed climbing 3-5 steps with a railing? : A Lot 6 Click Score: 17    End of Session Equipment Utilized During Treatment: Oxygen;Gait belt Activity Tolerance: Patient limited by fatigue Patient left: in bed;with call bell/phone within reach;with bed alarm set Nurse Communication: Mobility status PT Visit Diagnosis: Unsteadiness on feet (R26.81);Muscle weakness (generalized) (M62.81);Difficulty in walking, not elsewhere classified (R26.2);Pain Pain - Right/Left: Right     Time: 2878-6767 PT Time Calculation (min) (ACUTE ONLY): 8 min  Charges:  $Gait Training: 8-22 mins                    Julaine Fusi PTA 08/10/21, 3:20 PM

## 2021-08-11 DIAGNOSIS — U071 COVID-19: Secondary | ICD-10-CM | POA: Diagnosis not present

## 2021-08-11 DIAGNOSIS — J9621 Acute and chronic respiratory failure with hypoxia: Secondary | ICD-10-CM | POA: Diagnosis not present

## 2021-08-11 DIAGNOSIS — E876 Hypokalemia: Secondary | ICD-10-CM

## 2021-08-11 LAB — CBC
HCT: 41 % (ref 36.0–46.0)
Hemoglobin: 13 g/dL (ref 12.0–15.0)
MCH: 27.9 pg (ref 26.0–34.0)
MCHC: 31.7 g/dL (ref 30.0–36.0)
MCV: 88 fL (ref 80.0–100.0)
Platelets: 323 10*3/uL (ref 150–400)
RBC: 4.66 MIL/uL (ref 3.87–5.11)
RDW: 15.4 % (ref 11.5–15.5)
WBC: 11.9 10*3/uL — ABNORMAL HIGH (ref 4.0–10.5)
nRBC: 0 % (ref 0.0–0.2)

## 2021-08-11 LAB — MAGNESIUM: Magnesium: 2.1 mg/dL (ref 1.7–2.4)

## 2021-08-11 LAB — BASIC METABOLIC PANEL
Anion gap: 9 (ref 5–15)
BUN: 22 mg/dL (ref 8–23)
CO2: 29 mmol/L (ref 22–32)
Calcium: 8.8 mg/dL — ABNORMAL LOW (ref 8.9–10.3)
Chloride: 103 mmol/L (ref 98–111)
Creatinine, Ser: 0.5 mg/dL (ref 0.44–1.00)
GFR, Estimated: >60 mL/min (ref >60)
Glucose, Bld: 96 mg/dL (ref 70–99)
Potassium: 3.2 mmol/L — ABNORMAL LOW (ref 3.5–5.1)
Sodium: 141 mmol/L (ref 135–145)

## 2021-08-11 LAB — PHOSPHORUS: Phosphorus: 2.4 mg/dL — ABNORMAL LOW (ref 2.5–4.6)

## 2021-08-11 MED ORDER — K PHOS MONO-SOD PHOS DI & MONO 155-852-130 MG PO TABS
500.0000 mg | ORAL_TABLET | ORAL | Status: AC
Start: 2021-08-11 — End: 2021-08-11
  Administered 2021-08-11 (×2): 500 mg via ORAL
  Filled 2021-08-11 (×2): qty 2

## 2021-08-11 MED ORDER — POTASSIUM CHLORIDE CRYS ER 20 MEQ PO TBCR
40.0000 meq | EXTENDED_RELEASE_TABLET | Freq: Once | ORAL | Status: AC
Start: 2021-08-11 — End: 2021-08-11
  Administered 2021-08-11: 40 meq via ORAL
  Filled 2021-08-11: qty 2

## 2021-08-11 NOTE — Progress Notes (Signed)
Progress Note   Patient: Brianna Briggs JGO:[id number] DOB: October 22, 1950 DOA: 07/27/2021     14 DOS: the patient was seen and examined on 08/11/2021   Brief hospital course: Brianna Briggs is a 71 yo female with PMH 71 year old with history of CAD, HLD, solitary kidney, recurrent diverticulitis with abscess who presented with epigastric pain.  She had been on outpatient antibiotics with minimal improvement. CT abdomen/pelvis on admission showed persistent abscess attributed to acute sigmoid diverticulitis. She had initially been treated with percutaneous drainage and IV antibiotics as well. She was followed by general surgery and ultimately underwent sigmoid resection with anastomosis and loop ileostomy creation.  She had persistent lethargy and poor appetite after surgery.  She had learned that her husband was diagnosed with COVID and had also been visiting her in the hospital.  She underwent COVID-19 testing on 08/04/2021 which was also positive.  She was treated with a course of remdesivir and steroids given her worsening oxygen requirement from her baseline as well.    Assessment and Plan: * Sepsis due to diverticulitis with abscess (HCC)-resolved as of 08/04/2021 - Patient meets sepsis criteria with increased heart rate, leukocytosis, source of intra-abdominal abscess -S/p treatment with percutaneous drainage and prior IV antibiotic course - Now has undergone sigmoid resection with loop ileostomy creation - Antibiotics have now been discontinued  Diverticulitis of intestine with abscess Complicated diverticulitis with abscess.  Status post sigmoid resection with loop ileostomy creation on 07/31/2021. --Diet has been advanced to soft diet per surgery --Continue monitoring oral intake; needs improvement - Continue nausea control - Continue monitoring ileostomy output; adequate -- WOC following for ileostomy -- Continue education and training patient for self-management of  ileostomy  COVID-19 virus infection Patient's husband recently diagnosed after visiting her.  Pt tested on 7/20 and found to be positive. May contribute to the persistent lethargy, poor appetite, slightly increased O2 demands since surgery. - completed Remdesivir 7/20 x 3 days - transition IV to PO steroids and taper off  - mucinex  - incentive spirometery  Abdominal pain Patient continues to have ongoing pain s/p surgery. -Continue Norco, dilaudid for breakthrough pain  Acute on chronic respiratory failure with hypoxia ([place]) Recently started on nocturnal O2 at home ~2L but starting to have increased O2 demands on 7/18. Suspect some atelectasis postoperatively. CXR on 7/19 shows atelectasis and some small effusions Treated with Lasix x 1 on 7/19. -- Continue encouraging PO intake  -- Tested +Covid-19, likely contributes --Now weaned back down to baseline 2 L --Continue incentive spirometry  MDD (major depressive disorder) Per Dr. Sabino Gasser: "Spoke with patient at length bedside this morning.  She does have a history of depression and has been treated with medications in the past.  She could not recall which names but thinks 1 was Wellbutrin as she was also on it to help with smoking cessation. - specifically denies SI/HI - situationally she has lots of stressors in life that are persistent and she does meet some criteria to consider treatment (endorses difficulty with sleep, decreased energy, decreased appetite); given her prior percutaneous drain, this was also playing a role in some of her depression and also decreased ability to eat much and therefore had less energy.  After discussion, she is declining starting on medication at this time but understands if she changes her mind, this would be considered appropriate and reasonable" --Continue to monitor  -- Would consider Remeron if patient agreeable for mood, appetite and sleep  Hypophosphatemia Phos 2.4 this AM (7/27),  replacing with  oral K-phos. Monitor & replace phos PRN.  Hypokalemia K  3.2 this morning (7/27), replacing orally. Monitor and replace K as needed.  Malnutrition of moderate degree Body mass index is 20.56 kg/m. As evidenced by signs/symptoms consistent with PCM - fat loss, muscle loss. -- RD consulted, appreciate assistance. -- Continue MVI, Ensure  Perforated diverticulum of intestine As outlined.  Chronic obstructive pulmonary disease (COPD) (HCC) Stable with no signs or symptoms of exacerbation. --Continue albuterol as needed --Patient quit smoking about 3 weeks ago  Tobacco abuse - Nicotine patch as needed ordered for nicotine craving - Counseled patient that tobacco abuse also decreases wound healing  Hyperlipidemia, mixed - hold statin for now         Subjective: Patient awake resting in bed when seen on rounds today.  She continues to have very productive cough and difficulty producing phlegm.  She denies fevers or chills, chest pain or shortness of breath.  Continues to have a very poor appetite.  Also reports difficulty sleeping and ongoing depressed mood.  We discussed this in detail and she wants to continue to consider medication but declines starting any at this time.  Physical Exam: Vitals:   08/10/21 1928 08/11/21 0406 08/11/21 0415 08/11/21 0733  BP: (!) 117/94 126/82  117/75  Pulse: (!) 108 92 (!) 107 84  Resp: '18 18  18  '$ Temp: 98.1 F (36.7 C) 98.3 F (36.8 C)  98.3 F (36.8 C)  TempSrc:      SpO2: 95% (!) 86% 90% 93%  Weight:      Height:       General exam: awake, alert, no acute distress, frail HEENT: Nasal cannula in place, moist mucus membranes, hearing grossly normal  Respiratory system: on 2 L/min [place] O2, normal respiratory effort. Cardiovascular system: RRR, no pedal edema.   Gastrointestinal system: abdomen is soft, ostomy present on the right side with liquid stool in bag, midline surgical incision with staples intact and no signs of surrounding  erythema induration or swelling Central nervous system: A&O x3. no gross focal neurologic deficits, normal speech Extremities: moves all, no edema, normal tone Psychiatry: depressed mood, congruent affect   Data Reviewed:  Notable labs: K 3.2, phos 2.4, Ca 8.8, WBC 11.9 from 3.5 on 7/24 (on steroids for covid)  Family Communication: none  Disposition: Status is: Inpatient Remains inpatient appropriate because: Requires SNF placement after 10 days isolation for Covid.    Planned Discharge Destination: Skilled nursing facility    Time spent: 35 minutes  Author: Ezekiel Slocumb, DO 08/11/2021 1:44 PM  For on call review [email or web address].

## 2021-08-11 NOTE — Progress Notes (Signed)
Patient ID: Brianna Briggs, female   DOB: Jul 05, 1950, 71 y.o.   MRN: [id number]     [facility] Day(s): 14.   Interval History: Patient seen and examined, no acute events or new complaints overnight. Patient reports feeling a little bit better.  She was found sitting in the chair today.  She was in a better mood.  She still does not have a lot of appetite.  Vital signs in last 24 hours: [min-max] current  Temp:  [97.6 F (36.4 C)-98.3 F (36.8 C)] 97.6 F (36.4 C) (07/27 1532) Pulse Rate:  [84-108] 87 (07/27 1532) Resp:  [17-18] 17 (07/27 1532) BP: (93-126)/(67-94) 93/67 (07/27 1532) SpO2:  [86 %-95 %] 95 % (07/27 1532)     Height: '5\' 2"'$  (157.5 cm) Weight: 51.2 kg BMI (Calculated): 20.64   Physical Exam:  Constitutional: alert, cooperative and no distress  Respiratory: breathing non-labored at rest  Cardiovascular: regular rate and sinus rhythm  Gastrointestinal: soft, non-tender, and non-distended.  Ileostomy pink and patent.  Labs:     Latest Ref Rng & Units 08/11/2021    6:12 AM 08/08/2021    4:55 AM 08/07/2021    4:25 AM  CBC  WBC 4.0 - 10.5 K/uL 11.9  3.5  3.3   Hemoglobin 12.0 - 15.0 g/dL 13.0  11.3  10.8   Hematocrit 36.0 - 46.0 % 41.0  36.0  34.3   Platelets 150 - 400 K/uL 323  261  252       Latest Ref Rng & Units 08/11/2021    6:12 AM 08/08/2021    4:55 AM 08/07/2021    4:25 AM  CMP  Glucose 70 - 99 mg/dL 96  113  124   BUN 8 - 23 mg/dL '22  20  17   '$ Creatinine 0.44 - 1.00 mg/dL 0.50  0.56  0.57   Sodium 135 - 145 mmol/L 141  144  141   Potassium 3.5 - 5.1 mmol/L 3.2  3.8  3.7   Chloride 98 - 111 mmol/L 103  99  95   CO2 22 - 32 mmol/L 29  36  38   Calcium 8.9 - 10.3 mg/dL 8.8  8.8  8.5   Total Protein 6.5 - 8.1 g/dL  6.3  6.0   Total Bilirubin 0.3 - 1.2 mg/dL  0.6  0.7   Alkaline Phos 38 - 126 U/L  36  31   AST 15 - 41 U/L  32  29   ALT 0 - 44 U/L  15  13     Imaging studies: No new pertinent imaging studies   Assessment/Plan:   71 y.o. female with complaint of diverticulitis 11 Day Post-Op s/p partial colectomy with anastomosis and loop ileostomy creation.   -There has been no fever, tachycardia.  Vital signs stable -Continue having adequate function of the ileostomy -Tolerating diet but not eating much.  I told patient that they can bring food from outside if not will make her a better. -Today she was on a much better mood     COVID-19 -Patient diagnosed with COVID-19.   -Completed treatment -Clinically improved -Also she is smoker and COPD as per Dr. Raul Del note  -Management as per hospitalist -Pending to complete quarantine to be able to go to screening facility.  Arnold Long, MD

## 2021-08-11 NOTE — Assessment & Plan Note (Addendum)
K 3.7, has been stable. Repeat BMP in 1-2 weeks, replace K as needed.

## 2021-08-11 NOTE — Assessment & Plan Note (Addendum)
Resolved with replacement on 7/27 for phos 2.4, today 3.3.  Monitor & replace phos PRN.

## 2021-08-12 DIAGNOSIS — U071 COVID-19: Secondary | ICD-10-CM | POA: Diagnosis not present

## 2021-08-12 DIAGNOSIS — K5720 Diverticulitis of large intestine with perforation and abscess without bleeding: Secondary | ICD-10-CM | POA: Diagnosis not present

## 2021-08-12 LAB — BASIC METABOLIC PANEL
Anion gap: 9 (ref 5–15)
BUN: 25 mg/dL — ABNORMAL HIGH (ref 8–23)
CO2: 29 mmol/L (ref 22–32)
Calcium: 9 mg/dL (ref 8.9–10.3)
Chloride: 105 mmol/L (ref 98–111)
Creatinine, Ser: 0.52 mg/dL (ref 0.44–1.00)
GFR, Estimated: >60 mL/min (ref >60)
Glucose, Bld: 90 mg/dL (ref 70–99)
Potassium: 3.5 mmol/L (ref 3.5–5.1)
Sodium: 143 mmol/L (ref 135–145)

## 2021-08-12 LAB — CBC
HCT: 39.1 % (ref 36.0–46.0)
Hemoglobin: 12.4 g/dL (ref 12.0–15.0)
MCH: 28.2 pg (ref 26.0–34.0)
MCHC: 31.7 g/dL (ref 30.0–36.0)
MCV: 88.9 fL (ref 80.0–100.0)
Platelets: 297 10*3/uL (ref 150–400)
RBC: 4.4 MIL/uL (ref 3.87–5.11)
RDW: 15.8 % — ABNORMAL HIGH (ref 11.5–15.5)
WBC: 8.8 10*3/uL (ref 4.0–10.5)
nRBC: 0 % (ref 0.0–0.2)

## 2021-08-12 LAB — PHOSPHORUS: Phosphorus: 3.3 mg/dL (ref 2.5–4.6)

## 2021-08-12 NOTE — Progress Notes (Signed)
Progress Note   Patient: Brianna Briggs ZLD:[id number] DOB: 06/13/1950 DOA: 07/27/2021     15 DOS: the patient was seen and examined on 08/12/2021   Brief hospital course: Brianna Briggs is a 71 yo female with PMH 71 year old with history of CAD, HLD, solitary kidney, recurrent diverticulitis with abscess who presented with epigastric pain.  She had been on outpatient antibiotics with minimal improvement. CT abdomen/pelvis on admission showed persistent abscess attributed to acute sigmoid diverticulitis. She had initially been treated with percutaneous drainage and IV antibiotics as well. She was followed by general surgery and ultimately underwent sigmoid resection with anastomosis and loop ileostomy creation.  She had persistent lethargy and poor appetite after surgery.  She had learned that her husband was diagnosed with COVID and had also been visiting her in the hospital.  She underwent COVID-19 testing on 08/04/2021 which was also positive.  She was treated with a course of remdesivir and steroids given her worsening oxygen requirement from her baseline as well.    Assessment and Plan: * Sepsis due to diverticulitis with abscess (HCC)-resolved as of 08/04/2021 - Patient meets sepsis criteria with increased heart rate, leukocytosis, source of intra-abdominal abscess -S/p treatment with percutaneous drainage and prior IV antibiotic course - Now has undergone sigmoid resection with loop ileostomy creation - Antibiotics have now been discontinued  Diverticulitis of intestine with abscess Complicated diverticulitis with abscess.  Status post sigmoid resection with loop ileostomy creation on 07/31/2021. --Diet has been advanced to soft diet per surgery --Continue monitoring oral intake; needs improvement - Continue nausea control - Continue monitoring ileostomy output; adequate -- WOC following for ileostomy care and education -- Continue education and training patient for self-management of  ileostomy  COVID-19 virus infection Patient's husband recently diagnosed after visiting her.  Pt tested on 7/20 and found to be positive. May contribute to the persistent lethargy, poor appetite, slightly increased O2 demands since surgery. - completed Remdesivir 7/20 x 3 days - transition IV to PO steroids and taper off  - mucinex  - incentive spirometery  Abdominal pain Patient continues to have ongoing pain s/p surgery. -Continue Norco, dilaudid for breakthrough pain  Acute on chronic respiratory failure with hypoxia ([place]) Recently started on nocturnal O2 at home ~2L but starting to have increased O2 demands on 7/18. Suspect some atelectasis postoperatively. CXR on 7/19 shows atelectasis and some small effusions Treated with Lasix x 1 on 7/19. -- Continue encouraging PO intake  -- Tested +Covid-19, likely contributes --Now weaned back down to baseline 2 L --Continue incentive spirometry  Hypophosphatemia Resolved with replacement on 7/27 for phos 2.4, today 3.3.  Monitor & replace phos PRN.  Hypokalemia K 3.5, improved with replacement yesterday. Monitor and replace K as needed.  MDD (major depressive disorder) Per Dr. Sabino Gasser: "Spoke with patient at length bedside this morning.  She does have a history of depression and has been treated with medications in the past.  She could not recall which names but thinks 1 was Wellbutrin as she was also on it to help with smoking cessation. - specifically denies SI/HI - situationally she has lots of stressors in life that are persistent and she does meet some criteria to consider treatment (endorses difficulty with sleep, decreased energy, decreased appetite); given her prior percutaneous drain, this was also playing a role in some of her depression and also decreased ability to eat much and therefore had less energy.  After discussion, she is declining starting on medication at this time but understands  if she changes her mind, this would  be considered appropriate and reasonable" --Continue to monitor  -- Would consider Remeron if patient agreeable for mood, appetite and sleep  Malnutrition of moderate degree Body mass index is 20.56 kg/m. As evidenced by signs/symptoms consistent with PCM - fat loss, muscle loss. -- RD consulted, appreciate assistance. -- Continue MVI, Ensure  Perforated diverticulum of intestine As outlined.  Chronic obstructive pulmonary disease (COPD) (HCC) Stable with no signs or symptoms of exacerbation. --Continue albuterol as needed --Patient quit smoking about 3 weeks ago  Tobacco abuse - Nicotine patch as needed ordered for nicotine craving - Counseled patient that tobacco abuse also decreases wound healing  Hyperlipidemia, mixed - hold statin for now         Subjective: Patient seated edge of bed requested to get up to the bedside commode to void.  She reports overall doing well, ate a little bit better earlier this morning.  She dislikes the hospital food.  Husband was in to visit earlier and present for ostomy nurse visit.  Patient reports she seems to be having increased ostomy output, the bag was nearly half full after only being changed about 2 hours ago.  No other acute complaints at this time.   Physical Exam: Vitals:   08/11/21 2159 08/12/21 0533 08/12/21 0535 08/12/21 0725  BP: 90/69 102/73 98/75 (!) 96/54  Pulse: 76 81 82 85  Resp:  '16 16 17  '$ Temp: (!) 97.5 F (36.4 C)  98.4 F (36.9 C) 98 F (36.7 C)  TempSrc:    Oral  SpO2: 94% 93% 94% 94%  Weight:      Height:       General exam: awake, alert, no acute distress, frail HEENT: Nasal cannula in place, moist mucus membranes, hearing grossly normal  Respiratory system: on 2 L/min [place] O2, normal respiratory effort, lungs clear. Cardiovascular system: RRR, no pedal edema.   Gastrointestinal system: ostomy present on the right abdomen with liquid stool in bag, midline surgical incision with staples removed and no  signs of surrounding erythema induration or swelling Central nervous system: A&O x3. no gross focal neurologic deficits, normal speech Extremities: moves all, no edema, normal tone Psychiatry: Normal sees improved) mood, congruent affect   Data Reviewed:  Notable labs:    Family Communication: none  Disposition: Status is: Inpatient Remains inpatient appropriate because: Requires SNF placement after 10 days isolation for Covid.    Planned Discharge Destination: Skilled nursing facility    Time spent: 35 minutes  Author: Ezekiel Slocumb, DO 08/12/2021 2:35 PM  For on call review [email or web address].

## 2021-08-12 NOTE — Progress Notes (Signed)
Patient ID: Brianna Briggs, female   DOB: 1950-08-24, 72 y.o.   MRN: [id number]     [facility] Hospital Day(s): 15.   Interval History: Patient seen and examined, no acute events or new complaints overnight. Patient reports started to have cough again.  Denies any abdominal pain.  Denies any nausea or vomiting.  She was able to eat half of the food that the hospital brought.  Continue adequate ileostomy output.  Vital signs in last 24 hours: [min-max] current  Temp:  [97.5 F (36.4 C)-98.4 F (36.9 C)] 98 F (36.7 C) (07/28 0725) Pulse Rate:  [76-105] 85 (07/28 0725) Resp:  [16-17] 17 (07/28 0725) BP: (90-112)/(54-85) 96/54 (07/28 0725) SpO2:  [91 %-95 %] 94 % (07/28 0725)     Height: '5\' 2"'$  (157.5 cm) Weight: 51.2 kg BMI (Calculated): 20.64   Physical Exam:  Constitutional: alert, cooperative and no distress  Respiratory: breathing non-labored at rest  Cardiovascular: regular rate and sinus rhythm  Gastrointestinal: soft, non-tender, and non-distended.  Ileostomy pink and patent.  Labs:     Latest Ref Rng & Units 08/12/2021    4:11 AM 08/11/2021    6:12 AM 08/08/2021    4:55 AM  CBC  WBC 4.0 - 10.5 K/uL 8.8  11.9  3.5   Hemoglobin 12.0 - 15.0 g/dL 12.4  13.0  11.3   Hematocrit 36.0 - 46.0 % 39.1  41.0  36.0   Platelets 150 - 400 K/uL 297  323  261       Latest Ref Rng & Units 08/12/2021    4:11 AM 08/11/2021    6:12 AM 08/08/2021    4:55 AM  CMP  Glucose 70 - 99 mg/dL 90  96  113   BUN 8 - 23 mg/dL '25  22  20   '$ Creatinine 0.44 - 1.00 mg/dL 0.52  0.50  0.56   Sodium 135 - 145 mmol/L 143  141  144   Potassium 3.5 - 5.1 mmol/L 3.5  3.2  3.8   Chloride 98 - 111 mmol/L 105  103  99   CO2 22 - 32 mmol/L 29  29  36   Calcium 8.9 - 10.3 mg/dL 9.0  8.8  8.8   Total Protein 6.5 - 8.1 g/dL   6.3   Total Bilirubin 0.3 - 1.2 mg/dL   0.6   Alkaline Phos 38 - 126 U/L   36   AST 15 - 41 U/L   32   ALT 0 - 44 U/L   15     Imaging studies: No new pertinent imaging  studies   Assessment/Plan:  71 y.o. female with complaint of diverticulitis 12 Day Post-Op s/p partial colectomy with anastomosis and loop ileostomy creation.   -There has been no fever, tachycardia.  Vital signs stable -Continue having adequate function of the ileostomy -Tolerating diet but not eating much.  I told patient that they can bring food from outside if not will make her a better. -I remove the bridge from the ileostomy and staples from the wound.  No sign of infection.     COVID-19 -Patient diagnosed with COVID-19.   -Completed treatment -Patient is having coughing again.  Most likely disease from her chronic COPD. -Also she is smoker and COPD as per Dr. Raul Del note  -Management as per hospitalist -Pending to complete quarantine to be able to go to screening facility.  Arnold Long, MD

## 2021-08-12 NOTE — Consult Note (Signed)
[place] Nurse ostomy follow up Stoma type/location: RLQ, loop ileosotmy Stomal assessment/size: 1 1/2" oblong; measured this week; budded; functional os at 12 oclock; support bridge in place  Peristomal assessment: intact  Treatment options for stomal/peristomal skin: 2" skin barrier ring Output liquid brown/green 100cc emptied  Ostomy pouching: 2pc2 1/4" with 2" skin barrier ring  Education provided Husband present today for the first time; patient a bit more engaged. Staff encouraging patient to assist with emptying  Explained stoma characteristics (budded, flush, color, texture, care) Demonstrated pouch change (cutting new skin barrier, measuring stoma, cleaning peristomal skin and stoma, use of barrier ring) Education on emptying when 1/3 to 1/2 full and how to empty Demonstrated use of wick to clean spout  Educational materials left with husband to take home   Enrolled patient in [place] Start Discharge program: Yes during this admission  Plans for DC to SNF possibly Monday 6 ostomy pouches/skin barriers and barrier rings in the room along with current pattern for cutting skin barrier  WOC Nurse will follow along with you for continued support with ostomy teaching and care [place] MSN, [place], [place], [place], [employer]

## 2021-08-12 NOTE — Progress Notes (Signed)
Physical Therapy Treatment Patient Details Name: Brianna Briggs MRN: [id number] DOB: [birth year] Today's Date: 08/12/2021   History of Present Illness Pt is a 71 y/o F admitted on 07/27/21 after presenting with c/o nausea & percutaneous drainage. Pt was found to have persistent & increasing abscess in LLQ & intolerance to oral antibiotics. 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 PMH: CAD, dyslipidemia, solitary kidney & recurrent diverticulitis & diverticular abscess, recently admitted multiple times with lower abdominal pain.    PT Comments    Pt resting in bed upon PT arrival; agreeable to PT session.  Pt able to ambulate 60 feet before requiring sitting rest break d/t fatigue; then pt able to ambulate 120 feet with RW before requiring sitting rest break.  Limited distance ambulating d/t fatigue, B LE soreness with activity, and mild SOB.  Pt's O2 sats 90% or greater on 2 L O2 via nasal cannula during sessions activities.  Pt reporting she does not have working [facility] at home; pt also verbalizing concerns over managing ostomy care.  Will continue to focus on strengthening, endurance, and progressive functional mobility per pt tolerance.     Recommendations for follow up therapy are one component of a multi-disciplinary discharge planning process, led by the attending physician.  Recommendations may be updated based on patient status, additional functional criteria and insurance authorization.  Follow Up Recommendations  Skilled nursing-short term rehab (<3 hours/day) Can patient physically be transported by private vehicle: Yes   Assistance Recommended at Discharge Intermittent Supervision/Assistance  Patient can return home with the following A little help with walking and/or transfers;A little help with bathing/dressing/bathroom;Assistance with cooking/housework;Assist for transportation;Help with stairs or ramp for entrance   Equipment Recommendations  Rolling  walker (2 wheels);BSC/3in1    Recommendations for Other Services       Precautions / Restrictions Precautions Precautions: Fall Precaution Comments: new R side colostomy bag Restrictions Weight Bearing Restrictions: No     Mobility  Bed Mobility Overal bed mobility: Needs Assistance Bed Mobility: Supine to Sit, Sit to Supine     Supine to sit: Modified independent (Device/Increase time) Sit to supine: Modified independent (Device/Increase time)   General bed mobility comments: mild increased time to perform on own    Transfers Overall transfer level: Needs assistance Equipment used: Rolling walker (2 wheels) Transfers: Sit to/from Stand Sit to Stand: Min guard   Step pivot transfers: Min guard       General transfer comment: mild increased effort to stand from bed (x2 trials)    Ambulation/Gait Ambulation/Gait assistance: Min guard Gait Distance (Feet):  (60 feet; 120 feet) Assistive device: Rolling walker (2 wheels)   Gait velocity: decreased     General Gait Details: decreased B LE step length; SOB noted with activity   Stairs             Wheelchair Mobility    Modified Rankin (Stroke Patients Only)       Balance Overall balance assessment: Needs assistance Sitting-balance support: No upper extremity supported Sitting balance-Leahy Scale: Good Sitting balance - Comments: steady sitting reaching within BOS   Standing balance support: Bilateral upper extremity supported, During functional activity, Reliant on assistive device for balance Standing balance-Leahy Scale: Good Standing balance comment: no loss of balance with ambulation but reliant on RW for balance                            Cognition Arousal/Alertness: Awake/alert  Behavior During Therapy: Flat affect Overall Cognitive Status: Within Functional Limits for tasks assessed                                          Exercises      General Comments   Nursing cleared pt for participation in physical therapy.  Pt agreeable to PT session.      Pertinent Vitals/Pain Pain Assessment Pain Assessment: Faces Faces Pain Scale: Hurts little more Pain Location: chest when coughing Pain Descriptors / Indicators: Discomfort, Grimacing Pain Intervention(s): Limited activity within patient's tolerance, Monitored during session, Repositioned    Home Living                          Prior Function            PT Goals (current goals can now be found in the care plan section) Acute Rehab PT Goals Patient Stated Goal: less pain and move better PT Goal Formulation: With patient Time For Goal Achievement: 08/15/21 Potential to Achieve Goals: Good Progress towards PT goals: Progressing toward goals    Frequency    Min 2X/week      PT Plan Current plan remains appropriate    Co-evaluation              AM-PAC PT "6 Clicks" Mobility   Outcome Measure  Help needed turning from your back to your side while in a flat bed without using bedrails?: A Little Help needed moving from lying on your back to sitting on the side of a flat bed without using bedrails?: A Little Help needed moving to and from a bed to a chair (including a wheelchair)?: A Little Help needed standing up from a chair using your arms (e.g., wheelchair or bedside chair)?: A Little Help needed to walk in hospital room?: A Little Help needed climbing 3-5 steps with a railing? : A Lot 6 Click Score: 17    End of Session Equipment Utilized During Treatment: Oxygen;Gait belt Activity Tolerance: Patient limited by fatigue Patient left: in bed;with call bell/phone within reach;with bed alarm set;with nursing/sitter in room Nurse Communication: Mobility status;Precautions;Other (comment) (pt requesting ostomy to be emptied) PT Visit Diagnosis: Unsteadiness on feet (R26.81);Muscle weakness (generalized) (M62.81);Difficulty in walking, not elsewhere classified  (R26.2);Pain     Time: 7001-7494 PT Time Calculation (min) (ACUTE ONLY): 31 min  Charges:  $Gait Training: 8-22 mins $Therapeutic Exercise: 8-22 mins                     Leitha Bleak, PT 08/12/21, 5:29 PM

## 2021-08-13 DIAGNOSIS — U071 COVID-19: Secondary | ICD-10-CM | POA: Diagnosis not present

## 2021-08-13 LAB — BASIC METABOLIC PANEL
Anion gap: 8 (ref 5–15)
BUN: 25 mg/dL — ABNORMAL HIGH (ref 8–23)
CO2: 29 mmol/L (ref 22–32)
Calcium: 9 mg/dL (ref 8.9–10.3)
Chloride: 105 mmol/L (ref 98–111)
Creatinine, Ser: 0.59 mg/dL (ref 0.44–1.00)
GFR, Estimated: >60 mL/min (ref >60)
Glucose, Bld: 85 mg/dL (ref 70–99)
Potassium: 3.5 mmol/L (ref 3.5–5.1)
Sodium: 142 mmol/L (ref 135–145)

## 2021-08-13 MED ORDER — ALUM & MAG HYDROXIDE-SIMETH 200-200-20 MG/5ML PO SUSP
15.0000 mL | ORAL | Status: DC | PRN
  Administered 2021-08-13: 15 mL via ORAL
  Filled 2021-08-13: qty 30

## 2021-08-13 NOTE — Progress Notes (Signed)
Patient ID: Brianna Briggs, female   DOB: Mar 22, 1950, 71 y.o.   MRN: [id number]     [facility] Hospital Day(s): 16.   Interval History: Patient seen and examined, no acute events or new complaints overnight. Patient reports feeling a little bit better. Continue with cough. No abdominal pain, no nausea or vomiting.   Vital signs in last 24 hours: [min-max] current  Temp:  [97.7 F (36.5 C)-98.2 F (36.8 C)] 98.2 F (36.8 C) (07/29 0816) Pulse Rate:  [64-97] 64 (07/29 0816) Resp:  [16-18] 16 (07/29 0816) BP: (96-104)/(67-82) 104/68 (07/29 0816) SpO2:  [93 %-100 %] 100 % (07/29 0816) Weight:  [50.4 kg] 50.4 kg (07/29 0436)     Height: '5\' 2"'$  (157.5 cm) Weight: 50.4 kg BMI (Calculated): 20.32   Physical Exam:  Constitutional: alert, cooperative and no distress  Respiratory: breathing non-labored at rest  Cardiovascular: regular rate and sinus rhythm  Gastrointestinal: soft, non-tender, and non-distended. Ileostomy pink and patent.   Labs:     Latest Ref Rng & Units 08/12/2021    4:11 AM 08/11/2021    6:12 AM 08/08/2021    4:55 AM  CBC  WBC 4.0 - 10.5 K/uL 8.8  11.9  3.5   Hemoglobin 12.0 - 15.0 g/dL 12.4  13.0  11.3   Hematocrit 36.0 - 46.0 % 39.1  41.0  36.0   Platelets 150 - 400 K/uL 297  323  261       Latest Ref Rng & Units 08/13/2021    5:51 AM 08/12/2021    4:11 AM 08/11/2021    6:12 AM  CMP  Glucose 70 - 99 mg/dL 85  90  96   BUN 8 - 23 mg/dL '25  25  22   '$ Creatinine 0.44 - 1.00 mg/dL 0.59  0.52  0.50   Sodium 135 - 145 mmol/L 142  143  141   Potassium 3.5 - 5.1 mmol/L 3.5  3.5  3.2   Chloride 98 - 111 mmol/L 105  105  103   CO2 22 - 32 mmol/L '29  29  29   '$ Calcium 8.9 - 10.3 mg/dL 9.0  9.0  8.8     Imaging studies: No new pertinent imaging studies   Assessment/Plan:  71 y.o. female with complaint of diverticulitis 13 Day Post-Op s/p partial colectomy with anastomosis and loop ileostomy creation.   -There has been no fever, tachycardia.  Vital  signs stable -Continue having adequate function of the ileostomy -Eating better with outside food.  -Wounds are healing well.      COVID-19 -Patient diagnosed with COVID-19.   -Completed treatment -Patient is having coughing again.  Most likely disease from her chronic COPD. -Also she is smoker and COPD as per Dr. Raul Del note  -Management as per hospitalist -Pending to complete quarantine to be able to go to screening facility.  Arnold Long, MD

## 2021-08-13 NOTE — Progress Notes (Signed)
Patient appears to be sad. She is calm and cooperative. Emptied colostomy which was liquid. Gave her prn robitussin due to a dry cough. She denied pain.

## 2021-08-13 NOTE — Progress Notes (Signed)
Progress Note   Patient: Brianna Briggs PXT:[id number] DOB: [birth year] DOA: 07/27/2021     16 DOS: the patient was seen and examined on 08/13/2021   Brief hospital course: [name] is a 71 yo female with PMH 71 year old with history of CAD, HLD, solitary kidney, recurrent diverticulitis with abscess who presented with epigastric pain.  She had been on outpatient antibiotics with minimal improvement. CT abdomen/pelvis on admission showed persistent abscess attributed to acute sigmoid diverticulitis. She had initially been treated with percutaneous drainage and IV antibiotics as well. She was followed by general surgery and ultimately underwent sigmoid resection with anastomosis and loop ileostomy creation.  She had persistent lethargy and poor appetite after surgery.  She had learned that her husband was diagnosed with COVID and had also been visiting her in the hospital.  She underwent COVID-19 testing on 08/04/2021 which was also positive.  She was treated with a course of remdesivir and steroids given her worsening oxygen requirement from her baseline as well.    Assessment and Plan: * Sepsis due to diverticulitis with abscess (HCC)-resolved as of 08/04/2021 - Patient meets sepsis criteria with increased heart rate, leukocytosis, source of intra-abdominal abscess -S/p treatment with percutaneous drainage and prior IV antibiotic course - Now has undergone sigmoid resection with loop ileostomy creation - Antibiotics have now been discontinued  Diverticulitis of intestine with abscess Complicated diverticulitis with abscess.  Status post sigmoid resection with loop ileostomy creation on 07/31/2021. --Diet has been advanced to soft diet per surgery --Continue monitoring oral intake; needs improvement - Continue nausea control - Continue monitoring ileostomy output; adequate -- WOC following for ileostomy care and education -- Continue education and training patient for self-management of  ileostomy  COVID-19 virus infection Patient's husband recently diagnosed after visiting her.  Pt tested on 7/20 and found to be positive. May contribute to the persistent lethargy, poor appetite, slightly increased O2 demands since surgery. - completed Remdesivir 7/20 x 3 days - transition IV to PO steroids and taper off  - mucinex  - incentive spirometery  Abdominal pain Patient continues to have ongoing pain s/p surgery. -Continue Norco, dilaudid for breakthrough pain  Acute on chronic respiratory failure with hypoxia ([place]) Recently started on nocturnal O2 at home ~2L but starting to have increased O2 demands on 7/18. Suspect some atelectasis postoperatively. CXR on 7/19 shows atelectasis and some small effusions Treated with Lasix x 1 on 7/19. -- Continue encouraging PO intake  -- Tested +Covid-19, likely contributes --Now weaned back down to baseline 2 L --Continue incentive spirometry  Hypophosphatemia Resolved with replacement on 7/27 for phos 2.4, today 3.3.  Monitor & replace phos PRN.  Hypokalemia K 3.5, improved with replacement yesterday. Monitor and replace K as needed.  MDD (major depressive disorder) Per Dr. Sabino Gasser: "Spoke with patient at length bedside this morning.  She does have a history of depression and has been treated with medications in the past.  She could not recall which names but thinks 1 was Wellbutrin as she was also on it to help with smoking cessation. - specifically denies SI/HI - situationally she has lots of stressors in life that are persistent and she does meet some criteria to consider treatment (endorses difficulty with sleep, decreased energy, decreased appetite); given her prior percutaneous drain, this was also playing a role in some of her depression and also decreased ability to eat much and therefore had less energy.  After discussion, she is declining starting on medication at this time but understands  if she changes her mind, this would  be considered appropriate and reasonable" --Continue to monitor  -- Would consider Remeron if patient agreeable for mood, appetite and sleep  Malnutrition of moderate degree Body mass index is 20.56 kg/m. As evidenced by signs/symptoms consistent with PCM - fat loss, muscle loss. -- RD consulted, appreciate assistance. -- Continue MVI, Ensure  Perforated diverticulum of intestine As outlined.  Chronic obstructive pulmonary disease (COPD) (HCC) Stable with no signs or symptoms of exacerbation. --Continue albuterol as needed --Patient quit smoking about 3 weeks ago  Tobacco abuse - Nicotine patch as needed ordered for nicotine craving - Counseled patient that tobacco abuse also decreases wound healing  Hyperlipidemia, mixed - hold statin for now         Subjective: Patient resting in bed when seen on rounds.  She reports ongoing productive cough and difficulty producing phlegm.  Sometimes forceful coughing aggravates abdominal pain from her surgery.  She reports still having profound fatigue and frustrated about slow minutes to recover.  No other acute complaints at this time   Physical Exam: Vitals:   08/12/21 2047 08/13/21 0430 08/13/21 0436 08/13/21 0816  BP: 98/78 103/67  104/68  Pulse: 97 84  64  Resp: '18 16  16  '$ Temp: 98.2 F (36.8 C) 98.2 F (36.8 C)  98.2 F (36.8 C)  TempSrc: Oral Oral    SpO2: 94% 100%  100%  Weight:   50.4 kg   Height:       General exam: awake, alert, no acute distress, frail HEENT: Nasal cannula in place, moist mucus membranes, hearing grossly normal  Respiratory system: on 2 L/min [place] O2, normal respiratory effort, lungs clear. Cardiovascular system: RRR, no pedal edema.   Gastrointestinal system: ostomy present on the right abdomen which is soft and non-distended Central nervous system: A&O x3. no gross focal neurologic deficits, normal speech Extremities: moves all, no edema, normal tone Psychiatry: Normal mood, congruent affect,  normal judgment and insight   Data Reviewed:  Notable labs: BUN 25 otherwise normal BMP   Family Communication: none  Disposition: Status is: Inpatient Remains inpatient appropriate because: Requires SNF placement after 10 days isolation for Covid.    Planned Discharge Destination: Skilled nursing facility    Time spent: 25 minutes  Author: Ezekiel Slocumb, DO 08/13/2021 12:45 PM  For on call review [email or web address].

## 2021-08-14 DIAGNOSIS — K5720 Diverticulitis of large intestine with perforation and abscess without bleeding: Secondary | ICD-10-CM | POA: Diagnosis not present

## 2021-08-14 DIAGNOSIS — E440 Moderate protein-calorie malnutrition: Secondary | ICD-10-CM | POA: Diagnosis not present

## 2021-08-14 DIAGNOSIS — U071 COVID-19: Secondary | ICD-10-CM | POA: Diagnosis not present

## 2021-08-14 NOTE — Progress Notes (Signed)
Patient ID: Brianna Briggs, female   DOB: [birth year], 71 y.o.   MRN: [id number]     [place] Hospital Day(s): 17.   Interval History: Patient seen and examined, no acute events or new complaints overnight. Patient reports feeling better today. She has been able to eat better than the last couple of days. No issues with the ileostomy. Cough is now intermittent.   Main concerns are about the nursing facilities.   Vital signs in last 24 hours: [min-max] current  Temp:  [97.7 F (36.5 C)-98.3 F (36.8 C)] 98.2 F (36.8 C) (07/30 0811) Pulse Rate:  [66-87] 76 (07/30 0811) Resp:  [16-17] 17 (07/30 0811) BP: (81-111)/(55-86) 97/65 (07/30 0811) SpO2:  [95 %-99 %] 96 % (07/30 0811) Weight:  [50.2 kg] 50.2 kg (07/30 0500)     Height: '5\' 2"'$  (157.5 cm) Weight: 50.2 kg BMI (Calculated): 20.24   Physical Exam:  Constitutional: alert, cooperative and no distress  Respiratory: breathing non-labored at rest  Cardiovascular: regular rate and sinus rhythm  Gastrointestinal: soft, non-tender, and non-distended. Ileostomy pink and patent.   Labs:     Latest Ref Rng & Units 08/12/2021    4:11 AM 08/11/2021    6:12 AM 08/08/2021    4:55 AM  CBC  WBC 4.0 - 10.5 K/uL 8.8  11.9  3.5   Hemoglobin 12.0 - 15.0 g/dL 12.4  13.0  11.3   Hematocrit 36.0 - 46.0 % 39.1  41.0  36.0   Platelets 150 - 400 K/uL 297  323  261       Latest Ref Rng & Units 08/13/2021    5:51 AM 08/12/2021    4:11 AM 08/11/2021    6:12 AM  CMP  Glucose 70 - 99 mg/dL 85  90  96   BUN 8 - 23 mg/dL '25  25  22   '$ Creatinine 0.44 - 1.00 mg/dL 0.59  0.52  0.50   Sodium 135 - 145 mmol/L 142  143  141   Potassium 3.5 - 5.1 mmol/L 3.5  3.5  3.2   Chloride 98 - 111 mmol/L 105  105  103   CO2 22 - 32 mmol/L '29  29  29   '$ Calcium 8.9 - 10.3 mg/dL 9.0  9.0  8.8     Imaging studies: No new pertinent imaging studies   Assessment/Plan:  71 y.o. female with complaint of diverticulitis 14 Day Post-Op s/p partial colectomy  with anastomosis and loop ileostomy creation.   -There has been no fever, tachycardia.  Vital signs stable -Continue having adequate function of the ileostomy -Eating better with outside food.  -Wounds are healing well.      COVID-19 -Patient diagnosed with COVID-19.   -Completed treatment -Patient is having intermittent cough.  Most likely disease from her chronic COPD. -Also she is smoker and COPD as per Dr. Raul Del note  -Management as per hospitalist -Pending to complete quarantine to be able to go to screening facility.  Arnold Long, MD

## 2021-08-14 NOTE — Progress Notes (Signed)
Progress Note   Patient: Brianna Briggs CBJ:[id number] DOB: [birth year] DOA: 07/27/2021     17 DOS: the patient was seen and examined on 08/14/2021   Brief hospital course: Ms. Dillin is a 71 yo female with PMH 71 year old with history of CAD, HLD, solitary kidney, recurrent diverticulitis with abscess who presented with epigastric pain.  She had been on outpatient antibiotics with minimal improvement. CT abdomen/pelvis on admission showed persistent abscess attributed to acute sigmoid diverticulitis. She had initially been treated with percutaneous drainage and IV antibiotics as well. She was followed by general surgery and ultimately underwent sigmoid resection with anastomosis and loop ileostomy creation.  She had persistent lethargy and poor appetite after surgery.  She had learned that her husband was diagnosed with COVID and had also been visiting her in the hospital.  She underwent COVID-19 testing on 08/04/2021 which was also positive.  She was treated with a course of remdesivir and steroids given her worsening oxygen requirement from her baseline as well.    Assessment and Plan: * Sepsis due to diverticulitis with abscess (HCC)-resolved as of 08/04/2021 - Patient meets sepsis criteria with increased heart rate, leukocytosis, source of intra-abdominal abscess -S/p treatment with percutaneous drainage and prior IV antibiotic course - Now has undergone sigmoid resection with loop ileostomy creation - Antibiotics have now been discontinued  Diverticulitis of intestine with abscess Complicated diverticulitis with abscess.  Status post sigmoid resection with loop ileostomy creation on 07/31/2021. --Diet has been advanced to soft diet per surgery --Continue monitoring oral intake; needs improvement - Continue nausea control - Continue monitoring ileostomy output; adequate -- WOC following for ileostomy care and education -- Continue education and training patient for self-management of  ileostomy  COVID-19 virus infection Patient's husband recently diagnosed after visiting her.  Pt tested on 7/20 and found to be positive. May contribute to the persistent lethargy, poor appetite, slightly increased O2 demands since surgery. - completed Remdesivir 7/20 x 3 days - transition IV to PO steroids and taper off  - mucinex  - incentive spirometery  Abdominal pain Patient continues to have ongoing pain s/p surgery. -Continue Norco, dilaudid for breakthrough pain  Acute on chronic respiratory failure with hypoxia ([place]) Recently started on nocturnal O2 at home ~2L but starting to have increased O2 demands on 7/18. Suspect some atelectasis postoperatively. CXR on 7/19 shows atelectasis and some small effusions Treated with Lasix x 1 on 7/19. -- Continue encouraging PO intake  -- Tested +Covid-19, likely contributes --Now weaned back down to baseline 2 L --Continue incentive spirometry  Hypophosphatemia Resolved with replacement on 7/27  Monitor & replace phos PRN.  Hypokalemia K 3.5, improved with replacement yesterday. Monitor and replace K as needed.  MDD (major depressive disorder) Per Dr. Sabino Gasser: "Spoke with patient at length bedside this morning.  She does have a history of depression and has been treated with medications in the past.  She could not recall which names but thinks 1 was Wellbutrin as she was also on it to help with smoking cessation. - specifically denies SI/HI - situationally she has lots of stressors in life that are persistent and she does meet some criteria to consider treatment (endorses difficulty with sleep, decreased energy, decreased appetite); given her prior percutaneous drain, this was also playing a role in some of her depression and also decreased ability to eat much and therefore had less energy.  After discussion, she is declining starting on medication at this time but understands if she changes her mind,  this would be considered  appropriate and reasonable" --Continue to monitor  -- Would consider Remeron if patient agreeable for mood, appetite and sleep  Malnutrition of moderate degree Body mass index is 20.56 kg/m. As evidenced by signs/symptoms consistent with PCM - fat loss, muscle loss. -- RD consulted, appreciate assistance. -- Continue MVI, Ensure  Perforated diverticulum of intestine As outlined.  Chronic obstructive pulmonary disease (COPD) (HCC) Stable with no signs or symptoms of exacerbation. --Continue albuterol as needed --Patient quit smoking about 3 weeks ago  Tobacco abuse - Nicotine patch as needed ordered for nicotine craving - Counseled patient that tobacco abuse also decreases wound healing  Hyperlipidemia, mixed - hold statin for now         Subjective: Patient seen with husband visiting today.  She reports feeling fair overall.  She and husband have some concerns and questions regarding rehab and transitioning home from rehab and amount of assistance that will be available at home when she returns.  Patient reports ongoing high output from her ostomy.  Surgeon was just at bedside and told them this is a normal.   Physical Exam: Vitals:   08/14/21 0500 08/14/21 0513 08/14/21 0703 08/14/21 0811  BP:  (!) '81/55 95/71 97/65 '$  Pulse:  74 66 76  Resp:  16  17  Temp:  98.2 F (36.8 C)  98.2 F (36.8 C)  TempSrc:      SpO2:  95%  96%  Weight: 50.2 kg     Height:       General exam: awake, alert, no acute distress, frail HEENT: Clear conjunctiva, moist mucous members, hearing grossly normal, wearing glasses, nasal cannula in place Respiratory system: on 2 L/min [place] O2, normal respiratory effort. Cardiovascular system: RRR, no pedal edema.   Gastrointestinal system: ostomy present with liquid stool in the bag on the right abdomen which is soft and non-distended Central nervous system: A&O x3. no gross focal neurologic deficits, normal speech Extremities: moves all, no edema,  normal tone Psychiatry: Normal mood, congruent affect, normal judgment and insight   Data Reviewed:  No new labs today   Family Communication: none  Disposition: Status is: Inpatient Remains inpatient appropriate because: Requires SNF placement after 10 days isolation for Covid.    Planned Discharge Destination: Skilled nursing facility    Time spent: 35 minutes including time spent at bedside and coordination of care  Author: Ezekiel Slocumb, DO 08/14/2021 2:33 PM  For on call review [email or web address].

## 2021-08-14 NOTE — TOC Progression Note (Signed)
Transition of Care [facility]) - Progression Note    Patient Details  Name: Brianna Briggs MRN: [id number] Date of Birth: 02/03/1950  [facility]) CM/SW Contact  Izola Price, RN Phone Number: 08/14/2021, 12:58 PM  Clinical Narrative: 7/30: Per provider patient wanted to speak to CM abut another facility. CM spoke with patient and she now feels [facility] is a better and closer choice after reading reviews on [facility]. I explained review are good/bad for all and she was ready to go Monday, but she still wanted to change her choice. Left VM with Magda Paganini at [facility]. Resent CSW referral via [place]. TOC to follow up with patient after hearing from [facility]. Simmie Davies RN CM     Expected Discharge Plan:  (TBD) Barriers to Discharge: Continued Medical Work up  Expected Discharge Plan and Services Expected Discharge Plan:  (TBD)   Discharge Planning Services: CM Consult   Living arrangements for the past 2 months: Single Family Home                                       Social Determinants of Health (SDOH) Interventions    Readmission Risk Interventions    07/28/2021    4:14 PM  Readmission Risk Prevention Plan  Transportation Screening Complete  PCP or Specialist Appt within 5-7 Days Complete  Home Care Screening Complete  Medication Review (RN CM) Complete

## 2021-08-15 DIAGNOSIS — K5780 Diverticulitis of intestine, part unspecified, with perforation and abscess without bleeding: Secondary | ICD-10-CM | POA: Diagnosis not present

## 2021-08-15 DIAGNOSIS — J449 Chronic obstructive pulmonary disease, unspecified: Secondary | ICD-10-CM | POA: Diagnosis not present

## 2021-08-15 DIAGNOSIS — E440 Moderate protein-calorie malnutrition: Secondary | ICD-10-CM | POA: Diagnosis not present

## 2021-08-15 DIAGNOSIS — U071 COVID-19: Secondary | ICD-10-CM | POA: Diagnosis not present

## 2021-08-15 LAB — BASIC METABOLIC PANEL
Anion gap: 8 (ref 5–15)
BUN: 22 mg/dL (ref 8–23)
CO2: 26 mmol/L (ref 22–32)
Calcium: 9.2 mg/dL (ref 8.9–10.3)
Chloride: 106 mmol/L (ref 98–111)
Creatinine, Ser: 0.6 mg/dL (ref 0.44–1.00)
GFR, Estimated: >60 mL/min (ref >60)
Glucose, Bld: 96 mg/dL (ref 70–99)
Potassium: 3.7 mmol/L (ref 3.5–5.1)
Sodium: 140 mmol/L (ref 135–145)

## 2021-08-15 NOTE — Progress Notes (Signed)
Nutrition Follow-up  DOCUMENTATION CODES:   Non-severe (moderate) malnutrition in context of chronic illness  INTERVENTION:   -D/c Prosource plus -Continue [employer] Instant Breakfast TID with meals -Continue regular diet -Encourage adequate PO intake  NUTRITION DIAGNOSIS:   Moderate Malnutrition related to chronic illness, catabolic illness (tobacco use, COPD, SVT, diverticulitis) as evidenced by moderate fat depletion, moderate muscle depletion, energy intake < 75% for > or equal to 1 month.  Ongoing  GOAL:   Patient will meet greater than or equal to 90% of their needs  Progressing  MONITOR:   Diet advancement, Labs, Weight trends, Skin, I & O's  REASON FOR ASSESSMENT:   Consult Assessment of nutrition requirement/status  ASSESSMENT:   71 y/o female with h/o COPD, HLD, breast cancer s/p mastectomy, CAD and diverticulitis s/p IR drain (may) who is admitted with recurrent diverticulitis with abscess.  7/16- s/p Procedure: Sigmoid colon resection with anastomosis                      Mobilization of splenic flexure                      ileostomy creation 7/17- diet advanced to full liquids 7/18- diet advanced to soft 7/20- COVID +  Reviewed I/O's: -300 ml x 24 hours and -6.6 L since 08/01/21   MD consulted pt for supplement options and due to concern for pt's weight loss.   Spoke with pt over the phone, who reports feeling a little better today. She shares that her appetite has improved, as her husband has been bringing her in food. She consumed 100% of her dinner last night (seafood) and anticipates her husband will bring her in [employer] tonight. Pt shares she is drinking the [employer], but husband brought the packet home today, as she thought she was going to be discharged.   Per TOC notes, plan to discharge to SNF tomorrow.   Discussed importance of good meal and supplement intake to promote healing. Interventions discussed with MD.    Medications reviewed.  Labs reviewed: CBGS: 88.  Diet Order:   Diet Order             Diet regular Room service appropriate? Yes; Fluid consistency: Thin  Diet effective now                   EDUCATION NEEDS:   Education needs have been addressed  Skin:  Skin Assessment: Skin Integrity Issues: Skin Integrity Issues:: Incisions Incisions: closed abdomen  Last BM:  08/15/21 (150 ml via colostomy)  Height:   Ht Readings from Last 1 Encounters:  07/27/21 '5\' 2"'$  (1.575 m)    Weight:   Wt Readings from Last 1 Encounters:  08/14/21 50.2 kg   BMI:  Body mass index is 20.24 kg/m.  Estimated Nutritional Needs:   Kcal:  8182-9937  Protein:  100-115 grams  Fluid:  > 1.7 L    Loistine Chance, RD, LDN, [place] Registered Dietitian II Certified Diabetes Care and Education Specialist Please refer to [facility] for RD and/or RD on-call/weekend/after hours pager

## 2021-08-15 NOTE — TOC Progression Note (Signed)
Transition of Care [facility]) - Progression Note    Patient Details  Name: Brianna Briggs MRN: [id number] Date of Birth: 1950/07/25  Transition of Care [facility]) CM/SW [place], [place] Phone Number: 08/15/2021, 9:35 AM  Clinical Narrative:     CSW updated Magda Paganini at [facility] that patient changed her mind and wishes to go to their facility, she reports they are able to accept patient tomorrow. MD updated.   Expected Discharge Plan:  (TBD) Barriers to Discharge: Continued Medical Work up  Expected Discharge Plan and Services Expected Discharge Plan:  (TBD)   Discharge Planning Services: CM Consult   Living arrangements for the past 2 months: Single Family Home                                       Social Determinants of Health (SDOH) Interventions    Readmission Risk Interventions    07/28/2021    4:14 PM  Readmission Risk Prevention Plan  Transportation Screening Complete  PCP or Specialist Appt within 5-7 Days Complete  Home Care Screening Complete  Medication Review (RN CM) Complete

## 2021-08-15 NOTE — Care Management Important Message (Signed)
Important Message  Patient Details  Name: Brianna Briggs MRN: [id number] Date of Birth: [birth year]   Medicare Important Message Given:  Yes  Patient is in isolation room so I reviewed the Important Message from Medicare with her by phone [phone]). She stated she understood her rights and in agreement with her discharge on Tuesday. I wished her a speedy recovery and thanked her for her time.   Juliann Pulse A Ellenie Salome 08/15/2021, 11:13 AM

## 2021-08-15 NOTE — Progress Notes (Signed)
Patient ID: Brianna Briggs, female   DOB: [birth year], 71 y.o.   MRN: [id number]     [place] Hospital Day(s): 18.   Interval History: Patient seen and examined, no acute events or new complaints overnight. Patient reports feeling better today.  She was able to eat better last night.  She denies any nausea or vomiting.  Cough improved today.  Vital signs in last 24 hours: [min-max] current  Temp:  [97.6 F (36.4 C)-98.6 F (37 C)] 98.6 F (37 C) (07/31 1543) Pulse Rate:  [76-97] 89 (07/31 1543) Resp:  [16-20] 17 (07/31 1543) BP: (84-110)/(61-85) 93/61 (07/31 1543) SpO2:  [94 %-99 %] 97 % (07/31 1543)     Height: '5\' 2"'$  (157.5 cm) Weight: 50.2 kg BMI (Calculated): 20.24   Physical Exam:  Constitutional: alert, cooperative and no distress  Respiratory: breathing non-labored at rest  Cardiovascular: regular rate and sinus rhythm  Gastrointestinal: soft, non-tender, and non-distended.  Ileostomy pink and patent  Labs:     Latest Ref Rng & Units 08/12/2021    4:11 AM 08/11/2021    6:12 AM 08/08/2021    4:55 AM  CBC  WBC 4.0 - 10.5 K/uL 8.8  11.9  3.5   Hemoglobin 12.0 - 15.0 g/dL 12.4  13.0  11.3   Hematocrit 36.0 - 46.0 % 39.1  41.0  36.0   Platelets 150 - 400 K/uL 297  323  261       Latest Ref Rng & Units 08/15/2021    6:09 AM 08/13/2021    5:51 AM 08/12/2021    4:11 AM  CMP  Glucose 70 - 99 mg/dL 96  85  90   BUN 8 - 23 mg/dL '22  25  25   '$ Creatinine 0.44 - 1.00 mg/dL 0.60  0.59  0.52   Sodium 135 - 145 mmol/L 140  142  143   Potassium 3.5 - 5.1 mmol/L 3.7  3.5  3.5   Chloride 98 - 111 mmol/L 106  105  105   CO2 22 - 32 mmol/L '26  29  29   '$ Calcium 8.9 - 10.3 mg/dL 9.2  9.0  9.0     Imaging studies: No new pertinent imaging studies   Assessment/Plan:  71 y.o. female with complaint of diverticulitis 15 Day Post-Op s/p partial colectomy with anastomosis and loop ileostomy creation.   -There has been no fever, tachycardia.  Vital signs stable -Continue  having adequate function of the ileostomy -Eating better with outside food.  -Wounds are healing well.      COVID-19 -Patient diagnosed with COVID-19.   -Completed treatment -Patient is having intermittent cough.  Most likely disease from her chronic COPD. -Also she is smoker and COPD as per Dr. Raul Del note  -Management as per hospitalist -Pending to complete quarantine to be able to go to screening facility.  Arnold Long, MD

## 2021-08-15 NOTE — Progress Notes (Signed)
Progress Note   Patient: Brianna Briggs SJG:[id number] DOB: 1950-03-06 DOA: 07/27/2021     18 DOS: the patient was seen and examined on 08/15/2021   Brief hospital course: Ms. Cude is a 71 yo female with PMH 71 year old with history of CAD, HLD, solitary kidney, recurrent diverticulitis with abscess who presented with epigastric pain.  She had been on outpatient antibiotics with minimal improvement. CT abdomen/pelvis on admission showed persistent abscess attributed to acute sigmoid diverticulitis. She had initially been treated with percutaneous drainage and IV antibiotics as well. She was followed by general surgery and ultimately underwent sigmoid resection with anastomosis and loop ileostomy creation.  She had persistent lethargy and poor appetite after surgery.  She had learned that her husband was diagnosed with COVID and had also been visiting her in the hospital.  She underwent COVID-19 testing on 08/04/2021 which was also positive.  She was treated with a course of remdesivir and steroids given her worsening oxygen requirement from her baseline as well.    Assessment and Plan: * Sepsis due to diverticulitis with abscess (HCC)-resolved as of 08/04/2021 - Patient meets sepsis criteria with increased heart rate, leukocytosis, source of intra-abdominal abscess -S/p treatment with percutaneous drainage and prior IV antibiotic course - Now has undergone sigmoid resection with loop ileostomy creation - Antibiotics have now been discontinued  Diverticulitis of intestine with abscess Complicated diverticulitis with abscess.  Status post sigmoid resection with loop ileostomy creation on 07/31/2021. --Diet has been advanced to soft diet per surgery --Continue monitoring oral intake; needs improvement - Continue nausea control - Continue monitoring ileostomy output; adequate -- WOC following for ileostomy care and education -- Continue education and training patient for self-management of  ileostomy  COVID-19 virus infection Patient's husband recently diagnosed after visiting her.  Pt tested on 7/20 and found to be positive. May contribute to the persistent lethargy, poor appetite, slightly increased O2 demands since surgery. 7/31 -- taken off isolation precautions (10 days) - completed Remdesivir 7/20 x 3 days - transitioned IV to PO steroids and now tapered off  - mucinex  - incentive spirometry & flutter  Abdominal pain Patient continues to have ongoing pain s/p surgery. -Continue Norco, dilaudid for breakthrough pain  Acute on chronic respiratory failure with hypoxia ([place]) Recently started on nocturnal O2 at home ~2L but starting to have increased O2 demands on 7/18. Suspect some atelectasis postoperatively. CXR on 7/19 shows atelectasis and some small effusions Treated with Lasix x 1 on 7/19. -- Continue encouraging PO intake  -- Tested +Covid-19, likely contributes --Now weaned back down to baseline 2 L --Continue incentive spirometry  Hypophosphatemia Resolved with replacement on 7/27  Monitor & replace phos PRN.  Hypokalemia K 3.5, improved with replacement yesterday. Monitor and replace K as needed.  MDD (major depressive disorder) Per Dr. Sabino Gasser: "Spoke with patient at length bedside this morning.  She does have a history of depression and has been treated with medications in the past.  She could not recall which names but thinks 1 was Wellbutrin as she was also on it to help with smoking cessation. - specifically denies SI/HI - situationally she has lots of stressors in life that are persistent and she does meet some criteria to consider treatment (endorses difficulty with sleep, decreased energy, decreased appetite); given her prior percutaneous drain, this was also playing a role in some of her depression and also decreased ability to eat much and therefore had less energy.  After discussion, she is declining starting on  medication at this time but  understands if she changes her mind, this would be considered appropriate and reasonable" --Continue to monitor  -- Would consider Remeron if patient agreeable for mood, appetite and sleep  Malnutrition of moderate degree Body mass index is 20.56 kg/m. As evidenced by signs/symptoms consistent with PCM - fat loss, muscle loss. -- RD consulted, appreciate assistance. -- Continue MVI, Carnation Instant Breakfast TID with meals -- Encourage PO intake   Perforated diverticulum of intestine As outlined.  Chronic obstructive pulmonary disease (COPD) (HCC) Stable with no signs or symptoms of exacerbation. --Continue albuterol as needed --Patient quit smoking about 3 weeks ago  Tobacco abuse - Nicotine patch as needed ordered for nicotine craving - Counseled patient that tobacco abuse also decreases wound healing  Hyperlipidemia, mixed - hold statin for now         Subjective: Patient seen awake resting in bed this AM.  Reports ongoing cough, no fever/chills.  Overall feels better.  Eating better with husband bringing in outside foods.  She reports being surprised by how much weight she's lost over past months and how she looks in the mirror.  We talked about protein supplement drinks etc.   Physical Exam: Vitals:   08/14/21 2132 08/15/21 0544 08/15/21 0923 08/15/21 1543  BP: (!) 87/70 110/85 (!) 84/65 93/61  Pulse: 97 91 90 89  Resp: '20 16 16 17  '$ Temp: 98.3 F (36.8 C) 97.6 F (36.4 C) 97.6 F (36.4 C) 98.6 F (37 C)  TempSrc:   Oral   SpO2: 94% 98% 99% 97%  Weight:      Height:       General exam: awake, alert, no acute distress, frail HEENT: moist mucous members, hearing grossly normal, wearing glasses, nasal cannula in place Respiratory system: on 2 L/min [place] O2, normal respiratory effort. Cardiovascular system: RRR, no pedal edema.   Gastrointestinal system: ostomy present with liquid stool in the bag on the right abdomen which is soft and non-distended Central  nervous system: A&O x3. no gross focal neurologic deficits, normal speech Extremities: moves all, no edema, normal tone Psychiatry: Normal mood, congruent affect, normal judgment and insight   Data Reviewed:  BMP today within normal limits   Family Communication: none  Disposition: Status is: Inpatient Remains inpatient appropriate because: Requires SNF placement.  Anticipate d/c to [place] Discharge Destination: Skilled nursing facility    Time spent: 35 minutes including time spent at bedside and coordination of care  Author: Ezekiel Slocumb, DO 08/15/2021 4:01 PM  For on call review [email or web address].

## 2021-08-16 DIAGNOSIS — E440 Moderate protein-calorie malnutrition: Secondary | ICD-10-CM | POA: Diagnosis not present

## 2021-08-16 DIAGNOSIS — M549 Dorsalgia, unspecified: Secondary | ICD-10-CM | POA: Clinically undetermined

## 2021-08-16 DIAGNOSIS — U071 COVID-19: Secondary | ICD-10-CM | POA: Diagnosis not present

## 2021-08-16 DIAGNOSIS — K5720 Diverticulitis of large intestine with perforation and abscess without bleeding: Secondary | ICD-10-CM | POA: Diagnosis not present

## 2021-08-16 DIAGNOSIS — Z8616 Personal history of COVID-19: Secondary | ICD-10-CM

## 2021-08-16 DIAGNOSIS — J449 Chronic obstructive pulmonary disease, unspecified: Secondary | ICD-10-CM | POA: Diagnosis not present

## 2021-08-16 HISTORY — DX: COVID-19: U07.1

## 2021-08-16 HISTORY — DX: Personal history of COVID-19: Z86.16

## 2021-08-16 MED ORDER — RISAQUAD PO CAPS: 1.0000 | ORAL_CAPSULE | Freq: Three times a day (TID) | ORAL | 0 refills | Status: DC

## 2021-08-16 MED ORDER — ALUM & MAG HYDROXIDE-SIMETH 200-200-20 MG/5ML PO SUSP: 15.0000 mL | ORAL | 0 refills | Status: DC | PRN

## 2021-08-16 MED ORDER — GUAIFENESIN ER 600 MG PO TB12: 600.0000 mg | ORAL_TABLET | Freq: Two times a day (BID) | ORAL | Status: DC

## 2021-08-16 MED ORDER — NICOTINE 21 MG/24HR TD PT24: 21.0000 mg | MEDICATED_PATCH | Freq: Every day | TRANSDERMAL | 0 refills | Status: DC | PRN

## 2021-08-16 MED ORDER — HYDROCODONE-ACETAMINOPHEN 7.5-325 MG PO TABS: 1.0000 | ORAL_TABLET | ORAL | 0 refills | Status: DC | PRN

## 2021-08-16 MED ORDER — TRAMADOL HCL 50 MG PO TABS: 50.0000 mg | ORAL_TABLET | Freq: Four times a day (QID) | ORAL | 0 refills | Status: DC | PRN

## 2021-08-16 MED ORDER — GUAIFENESIN-DM 100-10 MG/5ML PO SYRP: 10.0000 mL | ORAL_SOLUTION | ORAL | 0 refills | Status: DC | PRN

## 2021-08-16 MED ORDER — METHOCARBAMOL 500 MG PO TABS
500.0000 mg | ORAL_TABLET | Freq: Three times a day (TID) | ORAL | Status: DC | PRN
Start: 2021-08-16 — End: 2021-12-02

## 2021-08-16 MED ORDER — ONDANSETRON HCL 4 MG PO TABS: 4.0000 mg | ORAL_TABLET | Freq: Four times a day (QID) | ORAL | 0 refills | Status: DC | PRN

## 2021-08-16 NOTE — Assessment & Plan Note (Addendum)
Seems due to being more sedentary and in the bed. Pt describes tightness and unable to get comfortable. --Robaxin PRN for spasms --PRN pain medication if Robaxin not helpful --Continue PT --Mobilize and out of bed as much as tolerated

## 2021-08-16 NOTE — Progress Notes (Signed)
Patient ID: Brianna Briggs, female   DOB: May 04, 1950, 71 y.o.   MRN: [id number]     [facility] Hospital Day(s): 19.   Interval History: Patient seen and examined, no acute events or new complaints overnight. Patient reports feeling well.  She endorses having intermittent bilateral lower back pain with coughing.  Otherwise denies any nausea or vomiting.  Tolerating diet.  Vital signs in last 24 hours: [min-max] current  Temp:  [97.6 F (36.4 C)-98.7 F (37.1 C)] 97.8 F (36.6 C) (08/01 0420) Pulse Rate:  [87-107] 87 (08/01 0420) Resp:  [16-17] 16 (08/01 0420) BP: (84-105)/(61-77) 94/77 (08/01 0420) SpO2:  [94 %-100 %] 94 % (08/01 0420)     Height: '5\' 2"'$  (157.5 cm) Weight: 50.2 kg BMI (Calculated): 20.24   Physical Exam:  Constitutional: alert, cooperative and no distress  Respiratory: breathing non-labored at rest  Cardiovascular: regular rate and sinus rhythm  Gastrointestinal: soft, non-tender, and non-distended.  Ileostomy pink and patent.  Labs:     Latest Ref Rng & Units 08/12/2021    4:11 AM 08/11/2021    6:12 AM 08/08/2021    4:55 AM  CBC  WBC 4.0 - 10.5 K/uL 8.8  11.9  3.5   Hemoglobin 12.0 - 15.0 g/dL 12.4  13.0  11.3   Hematocrit 36.0 - 46.0 % 39.1  41.0  36.0   Platelets 150 - 400 K/uL 297  323  261       Latest Ref Rng & Units 08/15/2021    6:09 AM 08/13/2021    5:51 AM 08/12/2021    4:11 AM  CMP  Glucose 70 - 99 mg/dL 96  85  90   BUN 8 - 23 mg/dL '22  25  25   '$ Creatinine 0.44 - 1.00 mg/dL 0.60  0.59  0.52   Sodium 135 - 145 mmol/L 140  142  143   Potassium 3.5 - 5.1 mmol/L 3.7  3.5  3.5   Chloride 98 - 111 mmol/L 106  105  105   CO2 22 - 32 mmol/L '26  29  29   '$ Calcium 8.9 - 10.3 mg/dL 9.2  9.0  9.0     Imaging studies: No new pertinent imaging studies   Assessment/Plan:  71 y.o. female with complaint of diverticulitis 16 Day Post-Op s/p partial colectomy with anastomosis and loop ileostomy creation.   -There has been no fever,  tachycardia.  Vital signs stable -Continue having adequate function of the ileostomy -Eating better with outside food.  -Wounds are healing well.  -Agree with discharge to [facility]     COVID-19 -Patient diagnosed with COVID-19.   -Completed treatment -Patient is having intermittent cough.  Most likely disease from her chronic COPD. -Also she is smoker and COPD as per Dr. Raul Del note  -Management as per hospitalist -Pending to complete quarantine to be able to go to screening facility.  Arnold Long, MD

## 2021-08-16 NOTE — TOC Progression Note (Signed)
Transition of Care [facility]) - Progression Note    Patient Details  Name: Brianna Briggs MRN: [id number] Date of Birth: Jun 04, 1950  Transition [facility]) CM/SW [place], RN Phone Number: 08/16/2021, 10:11 AM  Clinical Narrative:   As per Magda Paganini at [facility], patient can transfer today.  Magda Paganini states they did not have information on ostomy.  Information obtained from wound nurse and relayed to [facility].  Patient will transfer by [facility].    Expected Discharge Plan:  (TBD) Barriers to Discharge: Continued Medical Work up  Expected Discharge Plan and Services Expected Discharge Plan:  (TBD)   Discharge Planning Services: CM Consult   Living arrangements for the past 2 months: Single Family Home                                       Social Determinants of Health (SDOH) Interventions    Readmission Risk Interventions    07/28/2021    4:14 PM  Readmission Risk Prevention Plan  Transportation Screening Complete  PCP or Specialist Appt within 5-7 Days Complete  Home Care Screening Complete  Medication Review (RN CM) Complete

## 2021-08-16 NOTE — Evaluation (Signed)
Physical Therapy Re-Evaluation Patient Details Name: Brianna Briggs MRN: [id number] DOB: 16-Aug-1950 Today's Date: 08/16/2021  History of Present Illness  Pt is a 71 y/o F admitted on 07/27/21 after presenting with c/o nausea & percutaneous drainage. Pt was found to have persistent & increasing abscess in LLQ & intolerance to oral antibiotics. 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 PMH: CAD, dyslipidemia, solitary kidney & recurrent diverticulitis & diverticular abscess, recently admitted multiple times with lower abdominal pain.   Clinical Impression  Patient alert, agreeable to PT with some encouragement. Seen as a re-evaluation, goals updated and addressed as needed. The patient overall has demonstrated improvement in function. Bed mobility modI with bed rails, extra time. Sit <> stand with RW and supervision, cued for hand placement. She was able to ambulate ~54f with RW and CGA-supervision, but endorsed and exhibited fatigue. Current recommendation remains appropriate to maximize pt function, independence, mobility, and education (especially with new colostomy) in preparation for safe return home.      Recommendations for follow up therapy are one component of a multi-disciplinary discharge planning process, led by the attending physician.  Recommendations may be updated based on patient status, additional functional criteria and insurance authorization.  Follow Up Recommendations Skilled nursing-short term rehab (<3 hours/day) Can patient physically be transported by private vehicle: Yes    Assistance Recommended at Discharge Intermittent Supervision/Assistance  Patient can return home with the following  A little help with walking and/or transfers;A little help with bathing/dressing/bathroom;Assistance with cooking/housework;Assist for transportation;Help with stairs or ramp for entrance    Equipment Recommendations Rolling walker (2 wheels);BSC/3in1   Recommendations for Other Services       Functional Status Assessment Patient has had a recent decline in their functional status and demonstrates the ability to make significant improvements in function in a reasonable and predictable amount of time.     Precautions / Restrictions Precautions Precautions: Fall Precaution Comments: new R side colostomy bag Restrictions Weight Bearing Restrictions: No      Mobility  Bed Mobility         Supine to sit: Modified independent (Device/Increase time) Sit to supine: Modified independent (Device/Increase time)   General bed mobility comments: mild increased time to perform on own    Transfers Overall transfer level: Needs assistance Equipment used: Rolling walker (2 wheels) Transfers: Sit to/from Stand Sit to Stand: Supervision           General transfer comment: cued for hand placement    Ambulation/Gait Ambulation/Gait assistance: Min guard Gait Distance (Feet):  (939f Assistive device: Rolling walker (2 wheels) Gait Pattern/deviations: Decreased step length - right, Decreased step length - left, Decreased stride length       General Gait Details: decreased B LE step length; pt reported fatigue  Stairs            Wheelchair Mobility    Modified Rankin (Stroke Patients Only)       Balance Overall balance assessment: Needs assistance Sitting-balance support: No upper extremity supported Sitting balance-Leahy Scale: Good       Standing balance-Leahy Scale: Good Standing balance comment: no loss of balance with ambulation but reliant on RW for balance                             Pertinent Vitals/Pain Pain Assessment Pain Assessment: Faces Faces Pain Scale: No hurt    Home Living  Prior Function                       Hand Dominance        Extremity/Trunk Assessment   Upper Extremity Assessment Upper Extremity Assessment:  Generalized weakness    Lower Extremity Assessment Lower Extremity Assessment: Generalized weakness       Communication      Cognition Arousal/Alertness: Awake/alert Behavior During Therapy: Flat affect Overall Cognitive Status: Within Functional Limits for tasks assessed                                          General Comments      Exercises     Assessment/Plan    PT Assessment Patient needs continued PT services  PT Problem List Decreased range of motion;Decreased strength;Cardiopulmonary status limiting activity;Pain;Decreased activity tolerance;Decreased balance;Decreased mobility;Decreased safety awareness;Decreased knowledge of use of DME       PT Treatment Interventions DME instruction;Therapeutic exercise;Gait training;Balance training;Stair training;Neuromuscular re-education;Functional mobility training;Patient/family education;Therapeutic activities    PT Goals (Current goals can be found in the Care Plan section)  Acute Rehab PT Goals Time For Goal Achievement: 08/30/21    Frequency Min 2X/week     Co-evaluation               AM-PAC PT "6 Clicks" Mobility  Outcome Measure Help needed turning from your back to your side while in a flat bed without using bedrails?: None Help needed moving from lying on your back to sitting on the side of a flat bed without using bedrails?: None Help needed moving to and from a bed to a chair (including a wheelchair)?: None Help needed standing up from a chair using your arms (e.g., wheelchair or bedside chair)?: A Little Help needed to walk in hospital room?: A Little Help needed climbing 3-5 steps with a railing? : A Lot 6 Click Score: 20    End of Session Equipment Utilized During Treatment: Oxygen;Gait belt Activity Tolerance: Patient limited by fatigue Patient left: in bed;with call bell/phone within reach Nurse Communication: Mobility status PT Visit Diagnosis: Unsteadiness on feet  (R26.81);Muscle weakness (generalized) (M62.81);Difficulty in walking, not elsewhere classified (R26.2);Pain Pain - Right/Left: Right Pain - part of body:  (abdomen)    Time: 5170-0174 PT Time Calculation (min) (ACUTE ONLY): 11 min   Charges:   PT Evaluation $PT Re-evaluation: 1 Re-eval         Lieutenant Diego PT, DPT 11:49 AM,08/16/21

## 2021-08-16 NOTE — Consult Note (Addendum)
[facility] Nurse ostomy follow up Stoma type/location: RLQ loop ileostomy. Dr. Windell Moment informed me yesterday via Secure Chat that he had removed the stoma bridge on Friday. Stomal assessment/size: 1 and 1/2 inches, oblong, slightly raised. Functional lumen at 12 o'clock Peristomal assessment: intact, clear Treatment options for stomal/peristomal skin: skin barrier ring Output: brown seedy stool Ostomy pouching: 2pc. 2 and 1/4 inch pouching system with skin barrier ring Education provided:  Patient and husband participating in session.  Husband is able to trace and cut the pattern on the skin barrier, remove the current pouching system, discard. I clean the skin.  Husband is able to stretch and place the skin barrier ring on the back of the skin barrier, then place the skin barrier (with ring) onto the skin. He is additionally able to place the pouch on to the sin barrier.  The patient is able to close the tail closure using the Lock and Roll closure. We discuss how Secure Start works and that with their insurance (Medicare with a BC&BS supplement) it is unlikely that they would have to pay anything out-of-pocket for their ostomy supplies. All questions answered to their expressed satisfaction. Plan is for Rehab discharge to [facility] later today. Three (3) setups for future pouch changes are in the room. Enrolled patient in [name] Discharge program: Yes, previously  [facility] nursing team will not follow, but will remain available to this patient, the nursing and medical teams.  Please re-consult if needed.  Thank you for inviting [place] to participate in this patient's Plan of Care.  Brianna Flakes, MSN, RN, CNS, [place], Brianna Briggs, [employer], [employer] phone:  [phone]

## 2021-08-16 NOTE — Discharge Summary (Addendum)
Physician Discharge Summary   Patient: Brianna Briggs MRN: [id number] DOB: 1950/02/06  Admit date:     07/27/2021  Discharge date: 08/16/2021  Discharge Physician: Ezekiel Slocumb   PCP: Kirk Ruths, MD   Recommendations at discharge:   Follow up with General Surgery as scheduled in about 2 weeks Follow up Primary Care in 1-2 week Repeat BMP, Mg, CBC in 1-2 weeks Follow up on Nutrition and caloric intake  Discharge Diagnoses: Active Problems:   Diverticulitis of intestine with abscess   Acute on chronic respiratory failure with hypoxia (HCC)   Abdominal pain   COVID-19 virus infection   MDD (major depressive disorder)   Hypokalemia   Hypophosphatemia   Hyperlipidemia, mixed   Tobacco abuse   Chronic obstructive pulmonary disease (COPD) (HCC)   Perforated diverticulum of intestine   Malnutrition of moderate degree   Back pain  Principal Problem (Resolved):   Sepsis due to diverticulitis with abscess [facility])  Hospital Course: [name] is a 71 yo female with PMH 71 year old with history of CAD, HLD, solitary kidney, recurrent diverticulitis with abscess who presented with epigastric pain.  She had been on outpatient antibiotics with minimal improvement. CT abdomen/pelvis on admission showed persistent abscess attributed to acute sigmoid diverticulitis. She had initially been treated with percutaneous drainage and IV antibiotics as well. She was followed by general surgery and ultimately underwent sigmoid resection with anastomosis and loop ileostomy creation.  She had persistent lethargy and poor appetite after surgery.  She had learned that her husband was diagnosed with COVID and had also been visiting her in the hospital.  She underwent COVID-19 testing on 08/04/2021 which was also positive.  She was treated with a course of remdesivir and steroids given her worsening oxygen requirement from her baseline as well.    Oxygen requirements now stable on baseline 2  L/min oxygen.  Appetite is still poor but slowly improving since husband has brought in food from outside the hospital.    General surgery has continued to follow.  Ostomy output has been adequate / normal.    8/1: Patient is stable for discharge to SNF for short-term rehab.     Assessment and Plan: * Sepsis due to diverticulitis with abscess (HCC)-resolved as of 08/04/2021 - Patient meets sepsis criteria with increased heart rate, leukocytosis, source of intra-abdominal abscess -S/p treatment with percutaneous drainage and prior IV antibiotic course - Now has undergone sigmoid resection with loop ileostomy creation - Antibiotics have now been discontinued  Diverticulitis of intestine with abscess Complicated diverticulitis with abscess.   Status post sigmoid resection with loop ileostomy creation on 07/31/2021. -- Ongoing poor appetite, encourage PO intake and optimize nutrition - Anti-emetic PRN (has not required recently) - Monitor ileostomy output -- Ostomy nurse consulted following for ileostomy care and education -- pt and husband have been trained and educated.  Pt is unable to adequately visualize the site to change bag independently, requires assistance.   COVID-19 virus infection Patient's husband recently diagnosed after visiting her. Pt tested positive on 7/20. Completed course of Redmesivir and Steroids 7/31 -- taken off isolation precautions (10 days) - mucinex PRN - incentive spirometry & flutter  Abdominal pain Improved overall.   --Continue pain control PRN with Tramadol for moderate pain, Norco for severe  Acute on chronic respiratory failure with hypoxia (HCC) Recently started on nocturnal O2 at home ~2L but starting to have increased O2 demands on 7/18, felt due to atelectasis postoperatively. CXR on 7/19 showed atelectasis and  some small effusions.  Treated with Lasix x 1 on 7/19. Pt found to be COVID-positive, completed treatment. --Now stable O2 sats on  baseline 2 L/min O2. --Incentive spirometer   Hypophosphatemia Resolved with replacement on 7/27  Monitor & replace phos PRN.  Hypokalemia K 3.7, has been stable. Repeat BMP in 1-2 weeks, replace K as needed.  MDD (major depressive disorder) Pt reports history of depression, treated with medications in the past but had negative side effects.   --Consider Remeron if patient agreeable - also would help with sleep and appetite --Discussed during admission with patient on a few separate occassions, she declines to start medication at time time. --PCP follow up   Back pain Seems due to being more sedentary and in the bed. Pt describes tightness and unable to get comfortable. --Robaxin PRN for spasms --PRN pain medication if Robaxin not helpful --Continue PT --Mobilize and out of bed as much as tolerated  Malnutrition of moderate degree Body mass index is 20.56 kg/m. As evidenced by signs/symptoms consistent with PCM - fat loss, muscle loss. -- RD consulted, appreciate assistance. -- Continue MVI, Carn[employer] Instant Breakfast TID with meals -- Encourage PO intake   Perforated diverticulum of intestine As outlined.  Chronic obstructive pulmonary disease (COPD) (HCC) Stable with no signs or symptoms of exacerbation. --Continue albuterol as needed --Patient quit smoking just a few weeks ago --Using nicotine patches  Tobacco abuse - Nicotine patch as needed ordered for nicotine craving - Counseled patient that tobacco abuse also decreases wound healing  Hyperlipidemia, mixed Pt no longer takes Crestor, per preference. Discuss resume statin at follow up.         Consultants: General surgery,  Procedures performed:  partial colectomy with anastamosis and loop ileostomy creation  Disposition: Skilled nursing facility  Diet recommendation:  Regular diet   DISCHARGE MEDICATION: Allergies as of 08/16/2021       Reactions   Amoxicillin-pot Clavulanate Diarrhea, Rash    Other reaction(s): Unknown   Biaxin [clarithromycin] Diarrhea   Other reaction(s): Unknown   Oxycodone Itching   Codeine Rash   Nitrofurantoin Rash        Medication List     STOP taking these medications    cefdinir 300 MG capsule Commonly known as: OMNICEF   dicyclomine 10 MG capsule Commonly known as: BENTYL   metroNIDAZOLE 500 MG tablet Commonly known as: FLAGYL   rosuvastatin 10 MG tablet Commonly known as: CRESTOR   sodium chloride flush 0.9 % Soln injection       TAKE these medications    acidophilus Caps capsule Take 1 capsule by mouth 3 (three) times daily for 14 days.   alum & mag hydroxide-simeth 200-200-20 MG/5ML suspension Commonly known as: MAALOX/MYLANTA Take 15 mLs by mouth every 4 (four) hours as needed for indigestion or heartburn.   Cholecalciferol 25 MCG (1000 UT) capsule Take 1,000 Units by mouth daily.   guaiFENesin 600 MG 12 hr tablet Commonly known as: MUCINEX Take 1 tablet (600 mg total) by mouth 2 (two) times daily.   guaiFENesin-dextromethorphan 100-10 MG/5ML syrup Commonly known as: ROBITUSSIN DM Take 10 mLs by mouth every 4 (four) hours as needed for cough.   HYDROcodone-acetaminophen 7.5-325 MG tablet Commonly known as: NORCO Take 1 tablet by mouth every 4 (four) hours as needed for severe pain.   methocarbamol 500 MG tablet Commonly known as: ROBAXIN Take 1 tablet (500 mg total) by mouth every 8 (eight) hours as needed for muscle spasms.   nicotine 21  mg/24hr patch Commonly known as: NICODERM CQ - dosed in mg/24 hours Place 1 patch (21 mg total) onto the skin daily as needed (nicotine craving).   nystatin powder Commonly known as: MYCOSTATIN/NYSTOP Apply 1 Application topically 2 (two) times daily as needed.   ondansetron 4 MG tablet Commonly known as: ZOFRAN Take 1 tablet (4 mg total) by mouth every 6 (six) hours as needed for nausea.   traMADol 50 MG tablet Commonly known as: ULTRAM Take 1 tablet (50 mg  total) by mouth every 6 (six) hours as needed for moderate pain.   Ventolin HFA 108 (90 Base) MCG/ACT inhaler Generic drug: albuterol Inhale 2 puffs into the lungs 2 (two) times daily.               Discharge Care Instructions  (From admission, onward)           Start     Ordered   08/16/21 0000  Discharge wound care:       Comments: Ostomy care, changing ostomy bag, closely monitor site and surrounding skin for any breakdown or signs of infection   08/16/21 1045            Contact information for after-discharge care     Destination     [facility] Preferred SNF .   Service: Skilled Nursing Contact information: [address] [place] [phone]                    Discharge Exam: Danley Danker Weights   08/10/21 0500 08/13/21 0436 08/14/21 0500  Weight: 51.2 kg 50.4 kg 50.2 kg   General exam: awake, alert, no acute distress, frail HEENT: wearing glasses, nasal cannula in place, moist mucus membranes, hearing grossly normal  Respiratory system: CTAB but diminished, no expiratory wheezes, normal respiratory effort at rest, on 2 L/min [place] O2. Cardiovascular system: normal S1/S2, RRR, no JVD, murmurs, rubs, gallops, no pedal edema.   Gastrointestinal system: ostomy bag with dark brown liquid stool in bag, soft, non-distended Central nervous system: A&O x4. no gross focal neurologic deficits, normal speech Extremities: moves all, no edema, normal tone Skin: dry, intact, normal temperature Psychiatry: normal mood, congruent affect, judgement and insight appear normal   Condition at discharge: stable  The results of significant diagnostics from this hospitalization (including imaging, microbiology, ancillary and laboratory) are listed below for reference.   Imaging Studies: DG Chest Port 1 View  Result Date: 08/07/2021 CLINICAL DATA:  COVID pneumonia. EXAM: PORTABLE CHEST 1 VIEW COMPARISON:  08/03/2021 and  06/08/2021 FINDINGS: Patient is rotated to the right. Lungs are adequately inflated with slight worsening opacification over the right base with mild elevation of the right hemidiaphragm as findings may be due to atelectasis with small associated effusion, although infection is possible. Remainder of the right lung as well as the left lung is unremarkable. Cardiomediastinal silhouette and remainder of the exam is unchanged. IMPRESSION: Slight worsening right base opacification likely atelectasis with small associated effusion. Infection in the right base is possible. Electronically Signed   By: Marin Olp M.D.   On: 08/07/2021 08:28   DG Chest Port 1 View  Result Date: 08/03/2021 CLINICAL DATA:  Sepsis. EXAM: PORTABLE CHEST 1 VIEW COMPARISON:  Jun 08, 2021. FINDINGS: The heart size and mediastinal contours are within normal limits. Bibasilar atelectasis or edema is noted with small bilateral pleural effusions. The visualized skeletal structures are unremarkable. IMPRESSION: Mild bibasilar atelectasis or edema is noted with small bilateral pleural  effusions. Electronically Signed   By: Marijo Conception M.D.   On: 08/03/2021 08:55   ECHOCARDIOGRAM COMPLETE BUBBLE STUDY  Result Date: 08/01/2021    ECHOCARDIOGRAM REPORT   Patient Name:   Brianna Briggs Date of Exam: 08/01/2021 Medical Rec #:  [id number]          Height:       62.0 in Accession #:    [id number]         Weight:       118.4 lb Date of Birth:  01-23-1950         BSA:          1.530 m Patient Age:    78 years           BP:           90/54 mmHg Patient Gender: F                  HR:           75 bpm. Exam Location:  [facility] Procedure: 2D Echo, Cardiac Doppler, Color Doppler and Saline Contrast Bubble            Study Indications:     Cardiomyopathy  History:         Patient has no prior history of Echocardiogram examinations.                  Risk Factors:Dyslipidemia.  Sonographer:     Sherrie Sport Referring Phys:  [id number] [facility] CINTRON-DIAZ  Diagnosing Phys: Kathlyn Sacramento MD IMPRESSIONS  1. Left ventricular ejection fraction, by estimation, is 55 to 60%. The left ventricle has normal function. The left ventricle has no regional wall motion abnormalities. Left ventricular diastolic parameters are consistent with Grade I diastolic dysfunction (impaired relaxation).  2. Right ventricular systolic function is normal. The right ventricular size is normal. Tricuspid regurgitation signal is inadequate for assessing PA pressure.  3. The mitral valve is normal in structure. No evidence of mitral valve regurgitation. No evidence of mitral stenosis.  4. The aortic valve is normal in structure. Aortic valve regurgitation is not visualized. Aortic valve sclerosis is present, with no evidence of aortic valve stenosis.  5. The inferior vena cava is normal in size with greater than 50% respiratory variability, suggesting right atrial pressure of 3 mmHg.  6. Agitated saline contrast bubble study was negative, with no evidence of any interatrial shunt. FINDINGS  Left Ventricle: Left ventricular ejection fraction, by estimation, is 55 to 60%. The left ventricle has normal function. The left ventricle has no regional wall motion abnormalities. The left ventricular internal cavity size was normal in size. There is  no left ventricular hypertrophy. Left ventricular diastolic parameters are consistent with Grade I diastolic dysfunction (impaired relaxation). Right Ventricle: The right ventricular size is normal. No increase in right ventricular wall thickness. Right ventricular systolic function is normal. Tricuspid regurgitation signal is inadequate for assessing PA pressure. The tricuspid regurgitant velocity is 1.82 m/s, and with an assumed right atrial pressure of 3 mmHg, the estimated right ventricular systolic pressure is 41.6 mmHg. Left Atrium: Left atrial size was normal in size. Right Atrium: Right atrial size was normal in size. Pericardium: There is no evidence of  pericardial effusion. Mitral Valve: The mitral valve is normal in structure. No evidence of mitral valve regurgitation. No evidence of mitral valve stenosis. Tricuspid Valve: The tricuspid valve is normal in structure. Tricuspid valve regurgitation is not demonstrated. No evidence of tricuspid  stenosis. Aortic Valve: The aortic valve is normal in structure. Aortic valve regurgitation is not visualized. Aortic valve sclerosis is present, with no evidence of aortic valve stenosis. Aortic valve mean gradient measures 5.5 mmHg. Aortic valve peak gradient measures 10.4 mmHg. Aortic valve area, by VTI measures 2.34 cm. Pulmonic Valve: The pulmonic valve was normal in structure. Pulmonic valve regurgitation is not visualized. No evidence of pulmonic stenosis. Aorta: The aortic root is normal in size and structure. Venous: The inferior vena cava is normal in size with greater than 50% respiratory variability, suggesting right atrial pressure of 3 mmHg. IAS/Shunts: No atrial level shunt detected by color flow Doppler. Agitated saline contrast was given intravenously to evaluate for intracardiac shunting. Agitated saline contrast bubble study was negative, with no evidence of any interatrial shunt.  LEFT VENTRICLE PLAX 2D LVIDd:         4.40 cm   Diastology LVIDs:         2.70 cm   LV e' medial:    4.68 cm/s LV PW:         1.00 cm   LV E/e' medial:  18.1 LV IVS:        0.80 cm   LV e' lateral:   7.94 cm/s LVOT diam:     2.00 cm   LV E/e' lateral: 10.7 LV SV:         66 LV SV Index:   43 LVOT Area:     3.14 cm  RIGHT VENTRICLE RV Basal diam:  3.20 cm RV S prime:     14.50 cm/s TAPSE (M-mode): 3.0 cm LEFT ATRIUM             Index        RIGHT ATRIUM           Index LA diam:        2.80 cm 1.83 cm/m   RA Area:     10.60 cm LA Vol (A2C):   37.2 ml 24.32 ml/m  RA Volume:   25.60 ml  16.73 ml/m LA Vol (A4C):   37.2 ml 24.32 ml/m LA Biplane Vol: 37.8 ml 24.71 ml/m  AORTIC VALVE AV Area (Vmax):    2.06 cm AV Area (Vmean):    1.93 cm AV Area (VTI):     2.34 cm AV Vmax:           161.50 cm/s AV Vmean:          109.500 cm/s AV VTI:            0.282 m AV Peak Grad:      10.4 mmHg AV Mean Grad:      5.5 mmHg LVOT Vmax:         106.00 cm/s LVOT Vmean:        67.100 cm/s LVOT VTI:          0.210 m LVOT/AV VTI ratio: 0.75  AORTA Ao Root diam: 2.70 cm MITRAL VALVE                TRICUSPID VALVE MV Area (PHT): 2.96 cm     TR Peak grad:   13.2 mmHg MV Decel Time: 256 msec     TR Vmax:        182.00 cm/s MV E velocity: 84.80 cm/s MV A velocity: 117.00 cm/s  SHUNTS MV E/A ratio:  0.72         Systemic VTI:  0.21 m  Systemic Diam: 2.00 cm Kathlyn Sacramento MD Electronically signed by Kathlyn Sacramento MD Signature Date/Time: 08/01/2021/10:21:59 AM    Final    CT ABDOMEN PELVIS W CONTRAST  Result Date: 07/29/2021 CLINICAL DATA:  Intra-abdominal abscess.  Evaluate drain placement. EXAM: CT ABDOMEN AND PELVIS WITH CONTRAST TECHNIQUE: Multidetector CT imaging of the abdomen and pelvis was performed using the standard protocol following bolus administration of intravenous contrast. RADIATION DOSE REDUCTION: This exam was performed according to the departmental dose-optimization program which includes automated exposure control, adjustment of the mA and/or kV according to patient size and/or use of iterative reconstruction technique. CONTRAST:  186m OMNIPAQUE IOHEXOL 300 MG/ML  SOLN COMPARISON:  CT of the abdomen and pelvis July 27, 2021 FINDINGS: Lower chest: No acute abnormality. Hepatobiliary: High attenuation material in the gallbladder identified without wall thickening or adjacent fat stranding. The liver is unremarkable. Pancreas: Unremarkable. No pancreatic ductal dilatation or surrounding inflammatory changes. Spleen: Normal in size without focal abnormality. Adrenals/Urinary Tract: The 8 mm left adrenal myelolipoma is stable. No change the adrenal glands. The right kidney remains atrophic with cystic changes. The  proximal right ureter remains prominent. The cyst in the medial left kidney is stable. Another cyst located posteriorly on image 34 is stable. No follow-up imaging recommended for the cysts. The left kidney is otherwise normal. The left adrenal gland is unremarkable. The bladder is unremarkable. Stomach/Bowel: The stomach and small bowel are within normal limits. There is persistent wall thickening and stranding along the sigmoid colon. The pigtail drainage catheter is stable, extending along the posterior aspect of the sigmoid colon. The small abscess adjacent to the pigtail catheter measures 2.1 by 0.6 cm today versus 2.7 x 0.8 cm previously. Anteriorly laterally is another abscess which may communicate which measures 3.0 by 1.3 cm on sagittal image 97 today versus 3.0 x 1.6 cm on the same image previously. This abscess tracks to the adjacent colon as identified on sagittal image 101. This track contained mostly air previously but now contains mostly fluid today. This abscess also appears to track laterally and anteriorly as identified on sagittal image 102 and axial image 61. This tract appears to contain primarily air. The amount of fat stranding surrounding the affected loop of colon is similar to slightly worsened. The colon is otherwise unremarkable. Visualized appendix is normal. Vascular/Lymphatic: Calcified atherosclerotic changes are seen in the nonaneurysmal aorta. No adenopathy. Reproductive: Uterus and bilateral adnexa are unremarkable. Other: A small amount of fluid is identified posteriorly in the pelvis, not identified previously. This fluid appears to be free and is likely reactive. No free air. Musculoskeletal: No change IMPRESSION: 1. Persistent abscess related to acute sigmoid diverticulitis. A portion of the abscess measuring 2.1 x 0.6 cm today measured 2.7 x 0.8 cm previously. Anteriorly and laterally is another abscess which may be connected to the first measuring 3.0 x 1.3 cm today versus  3.0 x 1.6 cm previously. Two tracks extend off this 3.0 x 1.3 cm abscess today. There is a fluid-filled tract extending to the colon which was air-filled before but unchanged in size. There is an anterior tract which is more conspicuous in the interval but primarily containing air. The amount of fat stranding in the region of the abscesses in colon is stable to mildly worsened. 2. Probable sludge or stones in the gallbladder. 3. There is a small amount of free fluid posteriorly in the pelvis, likely reactive. 4. No other significant interval changes. Electronically Signed   By: DDorise BullionIII  M.D.   On: 07/29/2021 16:21   CT ABDOMEN PELVIS W CONTRAST  Result Date: 07/27/2021 CLINICAL DATA:  Peritonitis or perforation suspected diverticulitis with drain in place, more redness and pain at site of drain with discharge EXAM: CT ABDOMEN AND PELVIS WITH CONTRAST TECHNIQUE: Multidetector CT imaging of the abdomen and pelvis was performed using the standard protocol following bolus administration of intravenous contrast. RADIATION DOSE REDUCTION: This exam was performed according to the departmental dose-optimization program which includes automated exposure control, adjustment of the mA and/or kV according to patient size and/or use of iterative reconstruction technique. CONTRAST:  132m OMNIPAQUE IOHEXOL 300 MG/ML  SOLN COMPARISON:  CT abdomen pelvis 07/19/2021 FINDINGS: Lower chest: No acute abnormality. Hepatobiliary: Unchanged hypodense area in the left hepatic lobe adjacent to the falciform, consistent with focal fat, as seen on recent MRI 02/03/2019. small amount of layering sludge or tiny stones in the gallbladder fundus. Pancreas: Unremarkable. No pancreatic ductal dilatation or surrounding inflammatory changes. Spleen: Normal in size without focal abnormality. 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 Unchanged severely atrophied right kidney with  cystic changes. Unchanged left renal scarring, cortical thinning, and small cysts. No follow-up imaging is recommended. No left renal hydroureteronephrosis. There is mild bladder wall thickening adjacent stranding, reactive to adjacent diverticulitis. Stomach/Bowel: The stomach is within normal limits. There is no evidence of bowel obstruction.The appendix is normal. There is persistent wall thickening and adjacent stranding along the sigmoid colon. There is a left-sided percutaneous drain in place, with small abscess at the pigtail measuring 2.7 x 0.8 cm, which previously measured 1.9 x 0.9 cm. Anterolaterally, there is another abscess which possibly communicates measuring up to 3.0 x 1.6 cm (series 6, image 79), which previously measured up to 4.1 x 2.2 cm. This abuts a loop of small bowel (series 2, image 53). Vascular/Lymphatic: Aortoiliac atherosclerosis. No AAA. No lymphadenopathy. Reproductive: Unremarkable. Other: Tiny fat containing umbilical hernia. Small fat containing inguinal hernias. No bowel containing hernia. Musculoskeletal: No acute osseous abnormality. No suspicious osseous lesion. Multilevel degenerative changes of the spine with grade 1 anterolisthesis at L4-L5. IMPRESSION: Persistent abscesses related to acute sigmoid diverticulitis, measuring 2.7 x 0.8 cm along the pigtail of the percutaneous drain which is slightly increased, and a slightly decreased anterolateral collection measuring 3.0 x 1.6 cm which previously measured 4.1 x 2.2 cm. The anterolateral collection abuts a loop of small bowel with mild wall thickening inflammatory change. There is also adjacent focal wall thickening of the bladder dome which is likely reactive. Electronically Signed   By: JMaurine SimmeringM.D.   On: 07/27/2021 15:14   CT ABDOMEN PELVIS W CONTRAST  Result Date: 07/19/2021 CLINICAL DATA:  Lower abdominal pain and rectal pain for several days, initial encounter EXAM: CT ABDOMEN AND PELVIS WITH CONTRAST TECHNIQUE:  Multidetector CT imaging of the abdomen and pelvis was performed using the standard protocol following bolus administration of intravenous contrast. RADIATION DOSE REDUCTION: This exam was performed according to the departmental dose-optimization program which includes automated exposure control, adjustment of the mA and/or kV according to patient size and/or use of iterative reconstruction technique. CONTRAST:  889mOMNIPAQUE IOHEXOL 300 MG/ML  SOLN COMPARISON:  06/23/2021 FINDINGS: Lower chest: No acute abnormality. Hepatobiliary: No focal liver abnormality is seen. No gallstones, gallbladder wall thickening, or biliary dilatation. Pancreas: Unremarkable. No pancreatic ductal dilatation or surrounding inflammatory changes. Spleen: Normal in size without focal abnormality. Adrenals/Urinary Tract: Adrenal glands are within normal limits. Small  myelolipoma is noted within the left adrenal. Right kidney is chronically atrophic. Bladder is decompressed. Scarring in the left kidney is noted. Stomach/Bowel: Changes of diverticulitis are again seen in the sigmoid colon similar to that noted on the prior exam. Drainage catheter is again identified along side the sigmoid colon. No residual abscess is seen. The degree of inflammatory change surrounding the sigmoid has progressed in the interval from the prior exam. A mildly irregular fluid collection is now seen along the anterior aspect of the sigmoid this is best visualized on images 71 through 79 on series 6 and image number 52 of series 2. Some mild adjacent thickening of small bowel loops is noted related to the progressive inflammatory change. More proximal colon is within normal limits. The appendix is within normal limits. Small bowel is otherwise within normal limits. Stomach is unremarkable. Vascular/Lymphatic: Aortic atherosclerosis. No enlarged abdominal or pelvic lymph nodes. Reproductive: Status post hysterectomy. No adnexal masses. Other: No abdominal wall  hernia or abnormality. No abdominopelvic ascites. Musculoskeletal: No acute or significant osseous findings. IMPRESSION: Persistent diverticulitis of the sigmoid colon. The previously drained fluid collection has resolved with the catheter in place. There is however a new multiloculated air-fluid collection consistent with progressive abscess formation identified along the anterior aspect of the sigmoid colon with some inflammatory involvement of adjacent loops of small bowel. No definitive fistulization is noted. Subcentimeter left adrenal mass, consistent with benign myelolipoma. No follow-up imaging is recommended. JACR 2017 Aug; 14(8):1038-44, JCAT 2016 Mar-Apr; 40(2):194-200, Urol J 2006 Spring; 3(2):71-4. Chronic atrophy of the right kidney. Electronically Signed   By: Inez Catalina M.D.   On: 07/19/2021 02:39   DG Abdomen 1 View  Result Date: 07/18/2021 CLINICAL DATA:  Abdominal pain, history of diverticulitis EXAM: ABDOMEN - 1 VIEW COMPARISON:  06/11/2021 FINDINGS: Scattered large and small bowel gas is noted. No obstructive changes are seen. Prior drainage catheter is again seen in the left lower quadrant stable from the prior exam. No acute bony abnormality is seen. IMPRESSION: No acute abnormality noted. Electronically Signed   By: Inez Catalina M.D.   On: 07/18/2021 20:25    Microbiology: Results for orders placed or performed during the hospital encounter of 07/27/21  Culture, blood (single)     Status: None   Collection Time: 07/27/21  5:41 PM   Specimen: BLOOD  Result Value Ref Range Status   Specimen Description BLOOD BLOOD LEFT HAND  Final   Special Requests   Final    BOTTLES DRAWN AEROBIC AND ANAEROBIC Blood Culture results may not be optimal due to an inadequate volume of blood received in culture bottles   Culture   Final    NO GROWTH 5 DAYS Performed at [facility], [address] [place], [place] [address]    Report Status 08/01/2021 FINAL  Final  Blood culture  (routine x 2)     Status: None   Collection Time: 07/27/21  7:24 PM   Specimen: BLOOD  Result Value Ref Range Status   Specimen Description BLOOD BLOOD LEFT FOREARM  Final   Special Requests   Final    BOTTLES DRAWN AEROBIC AND ANAEROBIC Blood Culture adequate volume   Culture   Final    NO GROWTH 5 DAYS Performed at [facility], [address] [place], [place] [address]    Report Status 08/01/2021 FINAL  Final  Blood culture (routine x 2)     Status: None   Collection Time: 07/27/21 10:35 PM  Specimen: BLOOD  Result Value Ref Range Status   Specimen Description BLOOD LEFT WRIST  Final   Special Requests   Final    BOTTLES DRAWN AEROBIC AND ANAEROBIC Blood Culture results may not be optimal due to an excessive volume of blood received in culture bottles   Culture   Final    NO GROWTH 5 DAYS Performed at [facility], [address] [place], [place] [address]    Report Status 08/01/2021 FINAL  Final  SARS Coronavirus 2 by RT PCR (hospital order, performed in [facility] hospital lab) *cepheid single result test* Anterior Nasal Swab     Status: Abnormal   Collection Time: 08/04/21  2:26 PM   Specimen: Anterior Nasal Swab  Result Value Ref Range Status   SARS Coronavirus 2 by RT PCR POSITIVE (A) NEGATIVE Final    Comment: (NOTE) SARS-CoV-2 target nucleic acids are DETECTED  SARS-CoV-2 RNA is generally detectable in upper respiratory specimens  during the acute phase of infection.  Positive results are indicative  of the presence of the identified virus, but do not rule out bacterial infection or co-infection with other pathogens not detected by the test.  Clinical correlation with patient history and  other diagnostic information is necessary to determine patient infection status.  The expected result is negative.  Fact Sheet for Patients:   https://www.patel.info/   Fact Sheet for Healthcare Providers:    https://hall.com/    This test is not yet approved or cleared by the [place] FDA and  has been authorized for detection and/or diagnosis of SARS-CoV-2 by FDA under an Emergency Use Authorization (EUA).  This EUA will remain in effect (meaning this test can be used) for the duration of  the COVID-19 declaration under Section 564(b)(1)  of the Act, 21 U.S.C. section 360-bbb-3(b)(1), unless the authorization is terminated or revoked sooner.   Performed at [facility], [address] [place], [place] [address]     Labs: CBC: Recent Labs  Lab 08/11/21 0612 08/12/21 0411  WBC 11.9* 8.8  HGB 13.0 12.4  HCT 41.0 39.1  MCV 88.0 88.9  PLT 323 622   Basic Metabolic Panel: Recent Labs  Lab 08/11/21 0612 08/12/21 0411 08/13/21 0551 08/15/21 0609  NA 141 143 142 140  K 3.2* 3.5 3.5 3.7  CL 103 105 105 106  CO2 '29 29 29 26  '$ GLUCOSE 96 90 85 96  BUN 22 25* 25* 22  CREATININE 0.50 0.52 0.59 0.60  CALCIUM 8.8* 9.0 9.0 9.2  MG 2.1  --   --   --   PHOS 2.4* 3.3  --   --    Liver Function Tests: No results for input(s): "AST", "ALT", "ALKPHOS", "BILITOT", "PROT", "ALBUMIN" in the last 168 hours. CBG: No results for input(s): "GLUCAP" in the last 168 hours.  Discharge time spent: greater than 30 minutes.  Signed: Ezekiel Slocumb, DO [facility] 08/16/2021

## 2021-08-20 ENCOUNTER — Emergency Department: Payer: Medicare Other

## 2021-08-20 ENCOUNTER — Inpatient Hospital Stay
Admission: EM | Admit: 2021-08-20 | Discharge: 2021-09-01 | DRG: 862 | Disposition: A | Discharge status: Home Health | Payer: Medicare Other | Source: Skilled Nursing Facility | Attending: Internal Medicine | Admitting: Internal Medicine

## 2021-08-20 ENCOUNTER — Other Ambulatory Visit: Payer: Self-pay

## 2021-08-20 DIAGNOSIS — I2510 Atherosclerotic heart disease of native coronary artery without angina pectoris: Secondary | ICD-10-CM | POA: Diagnosis present

## 2021-08-20 DIAGNOSIS — D649 Anemia, unspecified: Secondary | ICD-10-CM | POA: Diagnosis present

## 2021-08-20 DIAGNOSIS — Z881 Allergy status to other antibiotic agents status: Secondary | ICD-10-CM

## 2021-08-20 DIAGNOSIS — R001 Bradycardia, unspecified: Secondary | ICD-10-CM | POA: Diagnosis not present

## 2021-08-20 DIAGNOSIS — Z90710 Acquired absence of both cervix and uterus: Secondary | ICD-10-CM

## 2021-08-20 DIAGNOSIS — E782 Mixed hyperlipidemia: Secondary | ICD-10-CM | POA: Diagnosis present

## 2021-08-20 DIAGNOSIS — J9611 Chronic respiratory failure with hypoxia: Secondary | ICD-10-CM | POA: Diagnosis present

## 2021-08-20 DIAGNOSIS — I472 Ventricular tachycardia, unspecified: Secondary | ICD-10-CM | POA: Diagnosis not present

## 2021-08-20 DIAGNOSIS — R54 Age-related physical debility: Secondary | ICD-10-CM | POA: Diagnosis present

## 2021-08-20 DIAGNOSIS — Z888 Allergy status to other drugs, medicaments and biological substances status: Secondary | ICD-10-CM

## 2021-08-20 DIAGNOSIS — E876 Hypokalemia: Secondary | ICD-10-CM | POA: Diagnosis present

## 2021-08-20 DIAGNOSIS — K651 Peritoneal abscess: Secondary | ICD-10-CM | POA: Diagnosis not present

## 2021-08-20 DIAGNOSIS — E872 Acidosis, unspecified: Secondary | ICD-10-CM | POA: Diagnosis present

## 2021-08-20 DIAGNOSIS — J690 Pneumonitis due to inhalation of food and vomit: Secondary | ICD-10-CM | POA: Diagnosis present

## 2021-08-20 DIAGNOSIS — N261 Atrophy of kidney (terminal): Secondary | ICD-10-CM | POA: Diagnosis present

## 2021-08-20 DIAGNOSIS — Z85828 Personal history of other malignant neoplasm of skin: Secondary | ICD-10-CM

## 2021-08-20 DIAGNOSIS — N179 Acute kidney failure, unspecified: Secondary | ICD-10-CM | POA: Diagnosis present

## 2021-08-20 DIAGNOSIS — M542 Cervicalgia: Secondary | ICD-10-CM | POA: Diagnosis present

## 2021-08-20 DIAGNOSIS — R6521 Severe sepsis with septic shock: Secondary | ICD-10-CM | POA: Diagnosis present

## 2021-08-20 DIAGNOSIS — R627 Adult failure to thrive: Secondary | ICD-10-CM | POA: Diagnosis present

## 2021-08-20 DIAGNOSIS — Z79899 Other long term (current) drug therapy: Secondary | ICD-10-CM

## 2021-08-20 DIAGNOSIS — T8143XA Infection following a procedure, organ and space surgical site, initial encounter: Principal | ICD-10-CM | POA: Diagnosis present

## 2021-08-20 DIAGNOSIS — F329 Major depressive disorder, single episode, unspecified: Secondary | ICD-10-CM | POA: Diagnosis present

## 2021-08-20 DIAGNOSIS — Z86010 Personal history of colonic polyps: Secondary | ICD-10-CM

## 2021-08-20 DIAGNOSIS — G8929 Other chronic pain: Secondary | ICD-10-CM | POA: Diagnosis present

## 2021-08-20 DIAGNOSIS — E8809 Other disorders of plasma-protein metabolism, not elsewhere classified: Secondary | ICD-10-CM | POA: Diagnosis not present

## 2021-08-20 DIAGNOSIS — M549 Dorsalgia, unspecified: Secondary | ICD-10-CM | POA: Diagnosis present

## 2021-08-20 DIAGNOSIS — R309 Painful micturition, unspecified: Secondary | ICD-10-CM | POA: Diagnosis present

## 2021-08-20 DIAGNOSIS — Z932 Ileostomy status: Secondary | ICD-10-CM

## 2021-08-20 DIAGNOSIS — Z8616 Personal history of COVID-19: Secondary | ICD-10-CM

## 2021-08-20 DIAGNOSIS — I10 Essential (primary) hypertension: Secondary | ICD-10-CM | POA: Diagnosis present

## 2021-08-20 DIAGNOSIS — Z9981 Dependence on supplemental oxygen: Secondary | ICD-10-CM

## 2021-08-20 DIAGNOSIS — Z880 Allergy status to penicillin: Secondary | ICD-10-CM

## 2021-08-20 DIAGNOSIS — L309 Dermatitis, unspecified: Secondary | ICD-10-CM | POA: Diagnosis not present

## 2021-08-20 DIAGNOSIS — Z9011 Acquired absence of right breast and nipple: Secondary | ICD-10-CM

## 2021-08-20 DIAGNOSIS — Z853 Personal history of malignant neoplasm of breast: Secondary | ICD-10-CM

## 2021-08-20 DIAGNOSIS — E861 Hypovolemia: Secondary | ICD-10-CM | POA: Diagnosis present

## 2021-08-20 DIAGNOSIS — Z933 Colostomy status: Secondary | ICD-10-CM

## 2021-08-20 DIAGNOSIS — R6520 Severe sepsis without septic shock: Secondary | ICD-10-CM | POA: Diagnosis present

## 2021-08-20 DIAGNOSIS — J449 Chronic obstructive pulmonary disease, unspecified: Secondary | ICD-10-CM | POA: Diagnosis present

## 2021-08-20 DIAGNOSIS — J439 Emphysema, unspecified: Secondary | ICD-10-CM | POA: Diagnosis present

## 2021-08-20 DIAGNOSIS — E860 Dehydration: Secondary | ICD-10-CM | POA: Diagnosis not present

## 2021-08-20 DIAGNOSIS — Z841 Family history of disorders of kidney and ureter: Secondary | ICD-10-CM

## 2021-08-20 DIAGNOSIS — Z681 Body mass index (BMI) 19 or less, adult: Secondary | ICD-10-CM

## 2021-08-20 DIAGNOSIS — F17210 Nicotine dependence, cigarettes, uncomplicated: Secondary | ICD-10-CM | POA: Diagnosis present

## 2021-08-20 DIAGNOSIS — E440 Moderate protein-calorie malnutrition: Secondary | ICD-10-CM | POA: Diagnosis present

## 2021-08-20 DIAGNOSIS — Z9049 Acquired absence of other specified parts of digestive tract: Secondary | ICD-10-CM

## 2021-08-20 DIAGNOSIS — A419 Sepsis, unspecified organism: Secondary | ICD-10-CM | POA: Diagnosis present

## 2021-08-20 DIAGNOSIS — Z885 Allergy status to narcotic agent status: Secondary | ICD-10-CM

## 2021-08-20 LAB — CBC WITH DIFFERENTIAL/PLATELET
Abs Immature Granulocytes: 0.08 10*3/uL — ABNORMAL HIGH (ref 0.00–0.07)
Basophils Absolute: 0 10*3/uL (ref 0.0–0.1)
Basophils Relative: 0 %
Eosinophils Absolute: 0.1 10*3/uL (ref 0.0–0.5)
Eosinophils Relative: 0 %
HCT: 42.7 % (ref 36.0–46.0)
Hemoglobin: 13.5 g/dL (ref 12.0–15.0)
Immature Granulocytes: 1 %
Lymphocytes Relative: 6 %
Lymphs Abs: 1 10*3/uL (ref 0.7–4.0)
MCH: 28 pg (ref 26.0–34.0)
MCHC: 31.6 g/dL (ref 30.0–36.0)
MCV: 88.4 fL (ref 80.0–100.0)
Monocytes Absolute: 1 10*3/uL (ref 0.1–1.0)
Monocytes Relative: 6 %
Neutro Abs: 13.4 10*3/uL — ABNORMAL HIGH (ref 1.7–7.7)
Neutrophils Relative %: 87 %
Platelets: 247 10*3/uL (ref 150–400)
RBC: 4.83 MIL/uL (ref 3.87–5.11)
RDW: 15.9 % — ABNORMAL HIGH (ref 11.5–15.5)
WBC: 15.5 10*3/uL — ABNORMAL HIGH (ref 4.0–10.5)
nRBC: 0 % (ref 0.0–0.2)

## 2021-08-20 LAB — URINALYSIS, COMPLETE (UACMP) WITH MICROSCOPIC
Bacteria, UA: NONE SEEN
Bilirubin Urine: NEGATIVE
Glucose, UA: NEGATIVE mg/dL
Hgb urine dipstick: NEGATIVE
Ketones, ur: NEGATIVE mg/dL
Leukocytes,Ua: NEGATIVE
Nitrite: NEGATIVE
Protein, ur: NEGATIVE mg/dL
Specific Gravity, Urine: 1.043 — ABNORMAL HIGH (ref 1.005–1.030)
pH: 5 (ref 5.0–8.0)

## 2021-08-20 LAB — COMPREHENSIVE METABOLIC PANEL
ALT: 34 U/L (ref 0–44)
AST: 44 U/L — ABNORMAL HIGH (ref 15–41)
Albumin: 3 g/dL — ABNORMAL LOW (ref 3.5–5.0)
Alkaline Phosphatase: 114 U/L (ref 38–126)
Anion gap: 10 (ref 5–15)
BUN: 21 mg/dL (ref 8–23)
CO2: 24 mmol/L (ref 22–32)
Calcium: 9.4 mg/dL (ref 8.9–10.3)
Chloride: 100 mmol/L (ref 98–111)
Creatinine, Ser: 1.23 mg/dL — ABNORMAL HIGH (ref 0.44–1.00)
GFR, Estimated: 47 mL/min — ABNORMAL LOW (ref >60)
Glucose, Bld: 109 mg/dL — ABNORMAL HIGH (ref 70–99)
Potassium: 4.8 mmol/L (ref 3.5–5.1)
Sodium: 134 mmol/L — ABNORMAL LOW (ref 135–145)
Total Bilirubin: 0.6 mg/dL (ref 0.3–1.2)
Total Protein: 7.7 g/dL (ref 6.5–8.1)

## 2021-08-20 LAB — PROTIME-INR
INR: 1.1 (ref 0.8–1.2)
Prothrombin Time: 14.2 seconds (ref 11.4–15.2)

## 2021-08-20 LAB — LACTIC ACID, PLASMA
Lactic Acid, Venous: 2.3 mmol/L (ref 0.5–1.9)
Lactic Acid, Venous: 1.3 mmol/L (ref 0.5–1.9)

## 2021-08-20 LAB — PHOSPHORUS: Phosphorus: 3.4 mg/dL (ref 2.5–4.6)

## 2021-08-20 LAB — D-DIMER, QUANTITATIVE: D-Dimer, Quant: 1.28 ug/mL-FEU — ABNORMAL HIGH (ref 0.00–0.50)

## 2021-08-20 LAB — TROPONIN I (HIGH SENSITIVITY)
Troponin I (High Sensitivity): 9 ng/L (ref <18)
Troponin I (High Sensitivity): 6 ng/L (ref <18)

## 2021-08-20 LAB — APTT: aPTT: 31 seconds (ref 24–36)

## 2021-08-20 LAB — PROCALCITONIN: Procalcitonin: 0.16 ng/mL

## 2021-08-20 LAB — TSH: TSH: 1.716 u[IU]/mL (ref 0.350–4.500)

## 2021-08-20 MED ORDER — LACTATED RINGERS IV BOLUS
1000.0000 mL | Freq: Once | INTRAVENOUS | Status: AC
  Administered 2021-08-20: 1000 mL via INTRAVENOUS

## 2021-08-20 MED ORDER — SCOPOLAMINE 1 MG/3DAYS TD PT72
1.0000 | MEDICATED_PATCH | TRANSDERMAL | Status: DC
  Administered 2021-08-20 – 2021-08-23 (×3): 1.5 mg via TRANSDERMAL
  Filled 2021-08-20 (×3): qty 1

## 2021-08-20 MED ORDER — CEFEPIME HCL 2 G IV SOLR
2.0000 g | Freq: Once | INTRAVENOUS | Status: AC
  Administered 2021-08-20: 2 g via INTRAVENOUS
  Filled 2021-08-20: qty 12.5

## 2021-08-20 MED ORDER — METRONIDAZOLE 500 MG/100ML IV SOLN
500.0000 mg | Freq: Once | INTRAVENOUS | Status: AC
Start: 2021-08-20 — End: 2021-08-20
  Administered 2021-08-20: 500 mg via INTRAVENOUS
  Filled 2021-08-20: qty 100

## 2021-08-20 MED ORDER — LACTATED RINGERS IV BOLUS: 500.0000 mL | Freq: Once | INTRAVENOUS | Status: DC

## 2021-08-20 MED ORDER — LACTATED RINGERS IV SOLN: INTRAVENOUS | Status: DC

## 2021-08-20 MED ORDER — LACTATED RINGERS IV SOLN
INTRAVENOUS | Status: DC
Start: 2021-08-20 — End: 2021-08-21

## 2021-08-20 MED ORDER — ACETAMINOPHEN 500 MG PO TABS
1000.0000 mg | ORAL_TABLET | Freq: Once | ORAL | Status: AC
  Administered 2021-08-20: 1000 mg via ORAL
  Filled 2021-08-20: qty 2

## 2021-08-20 MED ORDER — NOREPINEPHRINE 4 MG/250ML-% IV SOLN
0.0000 ug/min | INTRAVENOUS | Status: DC
  Administered 2021-08-20: 2 ug/min via INTRAVENOUS
  Filled 2021-08-20: qty 250

## 2021-08-20 MED ORDER — IOHEXOL 350 MG/ML SOLN
75.0000 mL | Freq: Once | INTRAVENOUS | Status: AC | PRN
  Administered 2021-08-20: 75 mL via INTRAVENOUS

## 2021-08-20 MED ORDER — VANCOMYCIN HCL IN DEXTROSE 1-5 GM/200ML-% IV SOLN
1000.0000 mg | Freq: Once | INTRAVENOUS | Status: AC
  Administered 2021-08-20: 1000 mg via INTRAVENOUS
  Filled 2021-08-20: qty 200

## 2021-08-20 NOTE — Sepsis Progress Note (Signed)
Elink following code sepsis °

## 2021-08-20 NOTE — ED Triage Notes (Signed)
Pt to ED via [facility] from [facility]. Facility called due to potential dehydration and loss of appetite. Pt in rehab after colon surgery and has colostomy. Pt HR 130s, BP 89/69, 93% on 2L [place], RR 27. Pt diagnosed with COVID on 7/20 and states required oxygen after

## 2021-08-20 NOTE — Progress Notes (Signed)
CODE SEPSIS - PHARMACY COMMUNICATION  **Broad Spectrum Antibiotics should be administered within 1 hour of Sepsis diagnosis**  Time Code Sepsis Called/Page Received: 1900  Antibiotics Ordered: vancomycin and cefepime  Time of 1st antibiotic administration: 1917  Additional action taken by pharmacy: none  If necessary, Name of Provider/Nurse Contacted: n/a    Glean Salvo, PharmD Clinical Pharmacist  08/20/2021 7:16 PM

## 2021-08-20 NOTE — Progress Notes (Signed)
PHARMACY -  BRIEF ANTIBIOTIC NOTE   Pharmacy has received consults for vancomycin and cefepime from an ED provider.  The patient's profile has been reviewed for ht/wt/allergies/indication/available labs.    One time orders placed for vancomycin 1,000 mg x 1 and cefepime 2 grams x 1  Noted allergy to Augmentin (rash and nausea) documented in 2015. Patient has received multiple beta lactams (cefepime, Zosyn) in more recent past (2021-2023)  Further antibiotics/pharmacy consults should be ordered by admitting physician if indicated.                       Thank you,  Glean Salvo, PharmD Clinical Pharmacist  08/20/2021 6:53 PM

## 2021-08-20 NOTE — ED Notes (Signed)
While doing initial assessment it was found the IV to the [place] was infiltrated. Pt sts she had a mastectomy on the R side 27 years ago. After speaking with MD Z. Tamala Julian, he stated it was ok to start an IV to the [facility] for necessary testing if possible.

## 2021-08-20 NOTE — ED Notes (Signed)
ED Provider at bedside. 

## 2021-08-20 NOTE — ED Provider Notes (Addendum)
[facility] Provider Note    Event Date/Time   First MD Initiated Contact with Patient 08/20/21 1701     (approximate)   History   No chief complaint on file.   HPI  Brianna Briggs is a 71 y.o. 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  She has not had any fevers or other chest pain, headache or earache or sore throat.  She endorses very little appetite.  She states she is also had a little bit of burning with urination on and off for the last couple days.  She has not had any falls or injuries.      Physical Exam  Triage Vital Signs: ED Triage Vitals  Enc Vitals Group     BP      Pulse      Resp      Temp      Temp src      SpO2      Weight      Height      Head Circumference      Peak Flow      Pain Score      Pain Loc      Pain Edu?      Excl. in [place]?     Most recent vital signs: Vitals:   08/20/21 2300 08/20/21 2305  BP: 112/78 91/62  Pulse: (!) 44 84  Resp: (!) 21 19  Temp:    SpO2: 100% 98%    General: Awake, medically ill-appearing. CV:  Good peripheral perfusion.  2+ radial pulse.  Tachycardic. Resp:  Normal effort.  Some slight rhonchi at the bases.  Tachypneic. Abd:  No distention.  Mild tenderness in the left lower quadrant.  Otherwise soft throughout.  Colostomy in place with brown stool Other:  For mild tenderness of the left medial collateral ligament of the knee without any calf  tenderness large effusion or deformity.  No significant tenderness or erythema edema over the right neck.  Oropharynx is unremarkable.  Patient is able to move her neck left and right states it hurts a little more when she is moving to the right.   ED Results / Procedures / Treatments  Labs (all labs ordered are listed, but only abnormal results are displayed) Labs Reviewed  CBC WITH DIFFERENTIAL/PLATELET - Abnormal; Notable for the following components:      Result Value   WBC 15.5 (*)    RDW 15.9 (*)    Neutro Abs 13.4 (*)    Abs Immature Granulocytes 0.08 (*)    All other components within normal limits  COMPREHENSIVE METABOLIC PANEL - Abnormal; Notable for the following components:   Sodium 134 (*)    Glucose, Bld 109 (*)    Creatinine, Ser 1.23 (*)    Albumin 3.0 (*)    AST 44 (*)    GFR, Estimated 47 (*)    All other components within normal limits  URINALYSIS, COMPLETE (UACMP) WITH MICROSCOPIC - Abnormal; Notable  for the following components:   Color, Urine YELLOW (*)    APPearance CLEAR (*)    Specific Gravity, Urine 1.043 (*)    All other components within normal limits  LACTIC ACID, PLASMA - Abnormal; Notable for the following components:   Lactic Acid, Venous 2.3 (*)    All other components within normal limits  D-DIMER, QUANTITATIVE - Abnormal; Notable for the following components:   D-Dimer, Quant 1.28 (*)    All other components within normal limits  CULTURE, BLOOD (ROUTINE X 2)  CULTURE, BLOOD (ROUTINE X 2)  TSH  PHOSPHORUS  LACTIC ACID, PLASMA  PROTIME-INR  APTT  PROCALCITONIN  TROPONIN I (HIGH SENSITIVITY)  TROPONIN I (HIGH SENSITIVITY)     EKG  EKG is remarkable for sinus tachycardia with ventricular rate of 133, QTc interval of 524, borderline right axis deviation with nonspecific ST changes versus artifact in lead I, lead III, aVF and lateral leads   RADIOLOGY  CT C-spine my interpretation without evidence of any compression fracture but does  show 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  Also reviewed radiology's interpretation.  CTA chest on my interpretation without evidence of large PE.  There is no large effusion or pneumothorax.  It does appear to be some consolidation or collapse in the right middle lobe.  I reviewed radiology's interpretation and agree with the findings of debris in the middle lobe consistent with possible mucous plugging or aspiration as well as advanced emphysema and scattered pulmonary nodules as well as lipomatous hypertrophy in the intra-atrial septum and aortic atherosclerosis.  CT abdomen pelvis on my interpretation without evidence of perforation or diverticulitis or perinephric stranding.  I reviewed radiology interpretation and agree with 2 small right pelvic fluid collections 2.6 and 2.3 cm with some surrounding enhancement concerning for abscesses as well as layering of stones or sludge in the gallbladder without evidence of acute cholecystitis and interval loop ileostomy without evidence of obstruction.   PROCEDURES:  Critical Care performed: Yes, see critical care procedure note(s)  .Critical Care  Performed by: Lucrezia Starch, MD Authorized by: Lucrezia Starch, MD   Critical care provider statement:    Critical care time (minutes):  30   Critical care was necessary to treat or prevent imminent or life-threatening deterioration of the following conditions:  Sepsis   Critical care was time spent personally by me on the following activities:  Development of treatment plan with patient or surrogate, discussions with consultants, evaluation of patient's response to treatment, examination of patient, ordering and review of laboratory studies, ordering and review of radiographic studies, ordering and performing treatments and interventions, pulse oximetry, re-evaluation of patient's condition and review of old [place] ED: Medications  scopolamine  (TRANSDERM-SCOP) 1 MG/3DAYS 1.5 mg (1.5 mg Transdermal Patch Applied 08/20/21 1809)  norepinephrine (LEVOPHED) '4mg'$  in 256m (0.016 mg/mL) premix infusion (2 mcg/min Intravenous Rate/Dose Change 08/20/21 2305)  lactated ringers infusion ( Intravenous New Bag/Given 08/20/21 2257)  acetaminophen (TYLENOL) tablet 1,000 mg (1,000 mg Oral Given 08/20/21 2015)  lactated ringers bolus 1,000 mL (0 mLs Intravenous Stopped 08/20/21 1915)  lactated ringers bolus 1,000 mL (0 mLs Intravenous Stopped 08/20/21 2240)  vancomycin (VANCOCIN) IVPB 1000 mg/200 mL premix (0 mg Intravenous Stopped 08/20/21 2150)  ceFEPIme (MAXIPIME) 2 g in sodium chloride 0.9 % 100 mL IVPB (0 g Intravenous Stopped 08/20/21 2019)  iohexol (OMNIPAQUE) 350 MG/ML injection 75 mL (75 mLs Intravenous Contrast Given 08/20/21 1928)  metroNIDAZOLE (  FLAGYL) IVPB 500 mg (0 mg Intravenous Stopped 08/20/21 2242)  lactated ringers bolus 1,000 mL (0 mLs Intravenous Stopped 08/20/21 2240)     IMPRESSION / MDM / ASSESSMENT AND PLAN / ED COURSE  I reviewed the triage vital signs and the nursing notes. Patient's presentation is most consistent with acute presentation with potential threat to life or bodily function.                               Differential diagnosis includes, but is not limited to ACS, arrhythmia, anemia, UTI, pneumonia, PE, recurrent diverticulitis and possible electrolyte and metabolic derangements as well as kidney injury from poor oral intake over the last couple days.  EKG is remarkable for sinus tachycardia with ventricular rate of 133, QTc interval of 524, borderline right axis deviation with nonspecific ST changes versus artifact in lead I, lead III, aVF and lateral leads.  Troponins are nonelevated x2 and not suggestive of ACS.  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  Also reviewed radiology's interpretation.  CTA chest on my  interpretation without evidence of large PE.  There is no large effusion or pneumothorax.  It does appear to be some consolidation or collapse in the right middle lobe.  I reviewed radiology's interpretation and agree with the findings of debris in the middle lobe consistent with possible mucous plugging or aspiration as well as advanced emphysema and scattered pulmonary nodules as well as lipomatous hypertrophy in the intra-atrial septum and aortic atherosclerosis.  CT abdomen pelvis on my interpretation without evidence of perforation or diverticulitis or perinephric stranding.  I reviewed radiology interpretation and agree with 2 small right pelvic fluid collections 2.6 and 2.3 cm with some surrounding enhancement concerning for abscesses as well as layering of stones or sludge in the gallbladder without evidence of acute cholecystitis and interval loop ileostomy without evidence of obstruction.  CMP remarkable for creatinine of 1.23 compared to 0.65 days ago without other significant derangements.  CBC call for WBC count of 15.5 with normal hemoglobin and platelets.  TSH WNL.  Phosphorus WNL.  INR and PTT unremarkable.  Procalcitonin 0.16.  Initial lactic acid 2.3.  D-dimer 1.28.  Repeat lactic acid 1.3.  Urinalysis does not appear infected  Concern for sepsis with possible 2 sources including in the right lung possibly aspiration pneumonia as well as possible abscesses in abdomen.  I did discuss this with on-call surgeon Dr. Dahlia Byes agrees with broad-spectrum antibiotic coverage and will see patient tomorrow.  No indication for surgery tonight.  Patient is septic with tachycardia tachypnea, leukocytosis and lactic acid.  She is still hypotensive after 3 L IV fluids and broad-spectrum antibiotics and was started on Levophed.  She will be admitted to the medical ICU for further evaluation and management.  Patient is to be n.p.o. at midnight.  This order was placed      FINAL CLINICAL IMPRESSION(S) / ED  DIAGNOSES   Final diagnoses:  Sepsis, due to unspecified organism, unspecified whether acute organ dysfunction present ([place])  Intra-abdominal abscess ([place])     Rx / DC Orders   ED Discharge Orders     None        Note:  This document was prepared using Dragon voice recognition software and may include unintentional dictation errors.   Lucrezia Starch, MD 08/20/21 [place],  Ida Rogue, MD 08/20/21 [phone]

## 2021-08-20 NOTE — ED Notes (Signed)
Pt taken to radiology

## 2021-08-21 ENCOUNTER — Inpatient Hospital Stay: Payer: Medicare Other

## 2021-08-21 DIAGNOSIS — G8929 Other chronic pain: Secondary | ICD-10-CM | POA: Diagnosis present

## 2021-08-21 DIAGNOSIS — J9611 Chronic respiratory failure with hypoxia: Secondary | ICD-10-CM | POA: Diagnosis present

## 2021-08-21 DIAGNOSIS — R627 Adult failure to thrive: Secondary | ICD-10-CM | POA: Diagnosis not present

## 2021-08-21 DIAGNOSIS — N179 Acute kidney failure, unspecified: Secondary | ICD-10-CM | POA: Diagnosis present

## 2021-08-21 DIAGNOSIS — R6521 Severe sepsis with septic shock: Secondary | ICD-10-CM | POA: Diagnosis present

## 2021-08-21 DIAGNOSIS — M542 Cervicalgia: Secondary | ICD-10-CM | POA: Diagnosis present

## 2021-08-21 DIAGNOSIS — E440 Moderate protein-calorie malnutrition: Secondary | ICD-10-CM | POA: Diagnosis present

## 2021-08-21 DIAGNOSIS — D649 Anemia, unspecified: Secondary | ICD-10-CM | POA: Diagnosis present

## 2021-08-21 DIAGNOSIS — J690 Pneumonitis due to inhalation of food and vomit: Secondary | ICD-10-CM | POA: Diagnosis present

## 2021-08-21 DIAGNOSIS — E860 Dehydration: Secondary | ICD-10-CM | POA: Diagnosis not present

## 2021-08-21 DIAGNOSIS — E876 Hypokalemia: Secondary | ICD-10-CM | POA: Diagnosis not present

## 2021-08-21 DIAGNOSIS — Z8709 Personal history of other diseases of the respiratory system: Secondary | ICD-10-CM | POA: Diagnosis not present

## 2021-08-21 DIAGNOSIS — E872 Acidosis, unspecified: Secondary | ICD-10-CM

## 2021-08-21 DIAGNOSIS — R9431 Abnormal electrocardiogram [ECG] [EKG]: Secondary | ICD-10-CM | POA: Diagnosis not present

## 2021-08-21 DIAGNOSIS — Z933 Colostomy status: Secondary | ICD-10-CM | POA: Diagnosis not present

## 2021-08-21 DIAGNOSIS — R6520 Severe sepsis without septic shock: Secondary | ICD-10-CM | POA: Diagnosis not present

## 2021-08-21 DIAGNOSIS — F329 Major depressive disorder, single episode, unspecified: Secondary | ICD-10-CM | POA: Diagnosis present

## 2021-08-21 DIAGNOSIS — R001 Bradycardia, unspecified: Secondary | ICD-10-CM | POA: Diagnosis not present

## 2021-08-21 DIAGNOSIS — Z932 Ileostomy status: Secondary | ICD-10-CM | POA: Diagnosis not present

## 2021-08-21 DIAGNOSIS — K651 Peritoneal abscess: Secondary | ICD-10-CM | POA: Diagnosis present

## 2021-08-21 DIAGNOSIS — A419 Sepsis, unspecified organism: Secondary | ICD-10-CM | POA: Diagnosis present

## 2021-08-21 DIAGNOSIS — I10 Essential (primary) hypertension: Secondary | ICD-10-CM | POA: Diagnosis present

## 2021-08-21 DIAGNOSIS — T8143XA Infection following a procedure, organ and space surgical site, initial encounter: Secondary | ICD-10-CM | POA: Diagnosis present

## 2021-08-21 DIAGNOSIS — J439 Emphysema, unspecified: Secondary | ICD-10-CM | POA: Diagnosis present

## 2021-08-21 DIAGNOSIS — I472 Ventricular tachycardia, unspecified: Secondary | ICD-10-CM | POA: Diagnosis not present

## 2021-08-21 DIAGNOSIS — Z8616 Personal history of COVID-19: Secondary | ICD-10-CM | POA: Diagnosis not present

## 2021-08-21 DIAGNOSIS — Z515 Encounter for palliative care: Secondary | ICD-10-CM | POA: Diagnosis not present

## 2021-08-21 DIAGNOSIS — Z681 Body mass index (BMI) 19 or less, adult: Secondary | ICD-10-CM | POA: Diagnosis not present

## 2021-08-21 DIAGNOSIS — E8809 Other disorders of plasma-protein metabolism, not elsewhere classified: Secondary | ICD-10-CM | POA: Diagnosis not present

## 2021-08-21 DIAGNOSIS — E861 Hypovolemia: Secondary | ICD-10-CM | POA: Diagnosis present

## 2021-08-21 DIAGNOSIS — Z7189 Other specified counseling: Secondary | ICD-10-CM | POA: Diagnosis not present

## 2021-08-21 LAB — CBC WITH DIFFERENTIAL/PLATELET
Abs Immature Granulocytes: 0.02 10*3/uL (ref 0.00–0.07)
Basophils Absolute: 0 10*3/uL (ref 0.0–0.1)
Basophils Relative: 0 %
Eosinophils Absolute: 0.1 10*3/uL (ref 0.0–0.5)
Eosinophils Relative: 1 %
HCT: 28.6 % — ABNORMAL LOW (ref 36.0–46.0)
Hemoglobin: 9.4 g/dL — ABNORMAL LOW (ref 12.0–15.0)
Immature Granulocytes: 0 %
Lymphocytes Relative: 11 %
Lymphs Abs: 0.9 10*3/uL (ref 0.7–4.0)
MCH: 28.6 pg (ref 26.0–34.0)
MCHC: 32.9 g/dL (ref 30.0–36.0)
MCV: 86.9 fL (ref 80.0–100.0)
Monocytes Absolute: 0.7 10*3/uL (ref 0.1–1.0)
Monocytes Relative: 9 %
Neutro Abs: 6.8 10*3/uL (ref 1.7–7.7)
Neutrophils Relative %: 79 %
Platelets: 182 10*3/uL (ref 150–400)
RBC: 3.29 MIL/uL — ABNORMAL LOW (ref 3.87–5.11)
RDW: 15.7 % — ABNORMAL HIGH (ref 11.5–15.5)
WBC: 8.6 10*3/uL (ref 4.0–10.5)
nRBC: 0 % (ref 0.0–0.2)

## 2021-08-21 LAB — GASTROINTESTINAL PANEL BY PCR, STOOL (REPLACES STOOL CULTURE)

## 2021-08-21 LAB — BLOOD GAS, VENOUS
Acid-Base Excess: 1.5 mmol/L (ref 0.0–2.0)
Bicarbonate: 27.7 mmol/L (ref 20.0–28.0)
O2 Saturation: 85.6 %
Patient temperature: 37
pCO2, Ven: 49 mmHg (ref 44–60)
pH, Ven: 7.36 (ref 7.25–7.43)
pO2, Ven: 52 mmHg — ABNORMAL HIGH (ref 32–45)

## 2021-08-21 LAB — C DIFFICILE QUICK SCREEN W PCR REFLEX
C Diff antigen: NEGATIVE
C Diff interpretation: NOT DETECTED
C Diff toxin: NEGATIVE

## 2021-08-21 LAB — CBC
HCT: 33 % — ABNORMAL LOW (ref 36.0–46.0)
Hemoglobin: 10.8 g/dL — ABNORMAL LOW (ref 12.0–15.0)
MCH: 28.6 pg (ref 26.0–34.0)
MCHC: 32.7 g/dL (ref 30.0–36.0)
MCV: 87.5 fL (ref 80.0–100.0)
Platelets: 219 10*3/uL (ref 150–400)
RBC: 3.77 MIL/uL — ABNORMAL LOW (ref 3.87–5.11)
RDW: 15.7 % — ABNORMAL HIGH (ref 11.5–15.5)
WBC: 10.6 10*3/uL — ABNORMAL HIGH (ref 4.0–10.5)
nRBC: 0 % (ref 0.0–0.2)

## 2021-08-21 LAB — BASIC METABOLIC PANEL
Anion gap: 7 (ref 5–15)
BUN: 14 mg/dL (ref 8–23)
CO2: 25 mmol/L (ref 22–32)
Calcium: 8.7 mg/dL — ABNORMAL LOW (ref 8.9–10.3)
Chloride: 107 mmol/L (ref 98–111)
Creatinine, Ser: 0.87 mg/dL (ref 0.44–1.00)
GFR, Estimated: >60 mL/min (ref >60)
Glucose, Bld: 183 mg/dL — ABNORMAL HIGH (ref 70–99)
Potassium: 3.7 mmol/L (ref 3.5–5.1)
Sodium: 139 mmol/L (ref 135–145)

## 2021-08-21 LAB — MRSA NEXT GEN BY PCR, NASAL: MRSA by PCR Next Gen: NOT DETECTED

## 2021-08-21 LAB — STREP PNEUMONIAE URINARY ANTIGEN: Strep Pneumo Urinary Antigen: NEGATIVE

## 2021-08-21 LAB — TROPONIN I (HIGH SENSITIVITY)
Troponin I (High Sensitivity): 8 ng/L (ref <18)
Troponin I (High Sensitivity): 11 ng/L (ref <18)
Troponin I (High Sensitivity): 107 ng/L (ref <18)

## 2021-08-21 LAB — PHOSPHORUS: Phosphorus: 3.2 mg/dL (ref 2.5–4.6)

## 2021-08-21 LAB — POTASSIUM: Potassium: 3.7 mmol/L (ref 3.5–5.1)

## 2021-08-21 LAB — MAGNESIUM
Magnesium: 1.6 mg/dL — ABNORMAL LOW (ref 1.7–2.4)
Magnesium: 2.5 mg/dL — ABNORMAL HIGH (ref 1.7–2.4)

## 2021-08-21 LAB — GLUCOSE, CAPILLARY: Glucose-Capillary: 140 mg/dL — ABNORMAL HIGH (ref 70–99)

## 2021-08-21 LAB — T4, FREE: Free T4: 1.03 ng/dL (ref 0.61–1.12)

## 2021-08-21 LAB — LACTIC ACID, PLASMA: Lactic Acid, Venous: 1.1 mmol/L (ref 0.5–1.9)

## 2021-08-21 LAB — TSH: TSH: 1.547 u[IU]/mL (ref 0.350–4.500)

## 2021-08-21 MED ORDER — MAGNESIUM SULFATE 2 GM/50ML IV SOLN
2.0000 g | Freq: Once | INTRAVENOUS | Status: AC
Start: 2021-08-21 — End: 2021-08-21
  Administered 2021-08-21: 2 g via INTRAVENOUS
  Filled 2021-08-21: qty 50

## 2021-08-21 MED ORDER — LOPERAMIDE HCL 2 MG PO CAPS
4.0000 mg | ORAL_CAPSULE | Freq: Three times a day (TID) | ORAL | Status: DC
  Administered 2021-08-21 – 2021-08-22 (×2): 4 mg via ORAL
  Filled 2021-08-21 (×3): qty 2

## 2021-08-21 MED ORDER — MORPHINE SULFATE (PF) 2 MG/ML IV SOLN
1.0000 mg | INTRAVENOUS | Status: DC | PRN
  Administered 2021-08-22: 1 mg via INTRAVENOUS
  Filled 2021-08-21 (×3): qty 1

## 2021-08-21 MED ORDER — IPRATROPIUM-ALBUTEROL 0.5-2.5 (3) MG/3ML IN SOLN
3.0000 mL | Freq: Four times a day (QID) | RESPIRATORY_TRACT | Status: DC
Start: 2021-08-21 — End: 2021-08-21
  Administered 2021-08-21 (×2): 3 mL via RESPIRATORY_TRACT
  Filled 2021-08-21 (×2): qty 3

## 2021-08-21 MED ORDER — HEPARIN SODIUM (PORCINE) 5000 UNIT/ML IJ SOLN
5000.0000 [IU] | Freq: Three times a day (TID) | INTRAMUSCULAR | Status: DC
Start: 2021-08-21 — End: 2021-08-24
  Administered 2021-08-21 – 2021-08-23 (×9): 5000 [IU] via SUBCUTANEOUS
  Filled 2021-08-21 (×9): qty 1

## 2021-08-21 MED ORDER — NOREPINEPHRINE 4 MG/250ML-% IV SOLN
2.0000 ug/min | INTRAVENOUS | Status: DC
  Administered 2021-08-21 (×2): 6 ug/min via INTRAVENOUS
  Filled 2021-08-21: qty 250

## 2021-08-21 MED ORDER — ACETAMINOPHEN 325 MG PO TABS
650.0000 mg | ORAL_TABLET | Freq: Four times a day (QID) | ORAL | Status: DC | PRN
  Administered 2021-08-21: 650 mg via ORAL
  Filled 2021-08-21 (×2): qty 2

## 2021-08-21 MED ORDER — MORPHINE SULFATE (PF) 2 MG/ML IV SOLN
1.0000 mg | INTRAVENOUS | Status: DC | PRN
  Administered 2021-08-21: 1 mg via INTRAVENOUS
  Filled 2021-08-21: qty 1

## 2021-08-21 MED ORDER — ATROPINE SULFATE 1 MG/ML IV SOLN: 1.0000 mg | INTRAVENOUS | Status: DC | PRN

## 2021-08-21 MED ORDER — IOHEXOL 350 MG/ML SOLN
75.0000 mL | Freq: Once | INTRAVENOUS | Status: AC | PRN
  Administered 2021-08-21: 75 mL via INTRAVENOUS

## 2021-08-21 MED ORDER — MAGNESIUM SULFATE 4 GM/100ML IV SOLN
4.0000 g | Freq: Once | INTRAVENOUS | Status: AC
  Administered 2021-08-21: 4 g via INTRAVENOUS
  Filled 2021-08-21: qty 100

## 2021-08-21 MED ORDER — VASOPRESSIN 20 UNITS/100 ML INFUSION FOR SHOCK
0.0000 [IU]/min | INTRAVENOUS | Status: DC
  Administered 2021-08-21 – 2021-08-22 (×3): 0.03 [IU]/min via INTRAVENOUS
  Filled 2021-08-21 (×4): qty 100

## 2021-08-21 MED ORDER — IPRATROPIUM-ALBUTEROL 0.5-2.5 (3) MG/3ML IN SOLN: 3.0000 mL | Freq: Two times a day (BID) | RESPIRATORY_TRACT | Status: DC

## 2021-08-21 MED ORDER — SODIUM CHLORIDE 0.9 % IV SOLN: 250.0000 mL | INTRAVENOUS | Status: DC

## 2021-08-21 MED ORDER — ALBUMIN HUMAN 25 % IV SOLN
25.0000 g | Freq: Once | INTRAVENOUS | Status: AC
  Administered 2021-08-21: 25 g via INTRAVENOUS
  Filled 2021-08-21: qty 100

## 2021-08-21 MED ORDER — NOREPINEPHRINE 16 MG/250ML-% IV SOLN
0.0000 ug/min | INTRAVENOUS | Status: DC
  Administered 2021-08-21: 8 ug/min via INTRAVENOUS
  Administered 2021-08-23: 9 ug/min via INTRAVENOUS
  Administered 2021-08-24: 5 ug/min via INTRAVENOUS
  Filled 2021-08-21 (×3): qty 250

## 2021-08-21 MED ORDER — IPRATROPIUM-ALBUTEROL 0.5-2.5 (3) MG/3ML IN SOLN
3.0000 mL | Freq: Three times a day (TID) | RESPIRATORY_TRACT | Status: DC
Start: 2021-08-22 — End: 2021-08-26
  Administered 2021-08-22 – 2021-08-26 (×12): 3 mL via RESPIRATORY_TRACT
  Filled 2021-08-21 (×13): qty 3

## 2021-08-21 MED ORDER — POTASSIUM CHLORIDE 10 MEQ/100ML IV SOLN
10.0000 meq | INTRAVENOUS | Status: AC
  Administered 2021-08-21 (×4): 10 meq via INTRAVENOUS
  Filled 2021-08-21 (×4): qty 100

## 2021-08-21 MED ORDER — POLYETHYLENE GLYCOL 3350 17 G PO PACK: 17.0000 g | PACK | Freq: Every day | ORAL | Status: DC | PRN

## 2021-08-21 MED ORDER — LIDOCAINE HCL 1 % IJ SOLN
INTRAMUSCULAR | Status: AC
  Filled 2021-08-21: qty 10

## 2021-08-21 MED ORDER — CHLORHEXIDINE GLUCONATE CLOTH 2 % EX PADS
6.0000 | MEDICATED_PAD | Freq: Every day | CUTANEOUS | Status: DC
  Administered 2021-08-21 – 2021-09-01 (×7): 6 via TOPICAL

## 2021-08-21 MED ORDER — DEXTROSE IN LACTATED RINGERS 5 % IV SOLN
INTRAVENOUS | Status: DC
  Administered 2021-08-21: 125 mL/h via INTRAVENOUS

## 2021-08-21 MED ORDER — LACTATED RINGERS IV BOLUS
500.0000 mL | Freq: Once | INTRAVENOUS | Status: AC
  Administered 2021-08-21: 500 mL via INTRAVENOUS

## 2021-08-21 MED ORDER — BUDESONIDE 0.25 MG/2ML IN SUSP
0.2500 mg | Freq: Two times a day (BID) | RESPIRATORY_TRACT | Status: DC
  Administered 2021-08-21 – 2021-09-01 (×23): 0.25 mg via RESPIRATORY_TRACT
  Filled 2021-08-21 (×23): qty 2

## 2021-08-21 MED ORDER — DOCUSATE SODIUM 100 MG PO CAPS: 100.0000 mg | ORAL_CAPSULE | Freq: Two times a day (BID) | ORAL | Status: DC | PRN

## 2021-08-21 MED ORDER — MORPHINE SULFATE (PF) 2 MG/ML IV SOLN
INTRAVENOUS | Status: AC
  Administered 2021-08-21: 1 mg via INTRAVENOUS
  Filled 2021-08-21: qty 1

## 2021-08-21 MED ORDER — PIPERACILLIN-TAZOBACTAM 3.375 G IVPB
3.3750 g | Freq: Three times a day (TID) | INTRAVENOUS | Status: AC
  Administered 2021-08-21 – 2021-08-27 (×20): 3.375 g via INTRAVENOUS
  Filled 2021-08-21 (×20): qty 50

## 2021-08-21 MED ORDER — IPRATROPIUM-ALBUTEROL 0.5-2.5 (3) MG/3ML IN SOLN
3.0000 mL | Freq: Four times a day (QID) | RESPIRATORY_TRACT | Status: DC
  Administered 2021-08-21: 3 mL via RESPIRATORY_TRACT
  Filled 2021-08-21: qty 3

## 2021-08-21 MED ORDER — IPRATROPIUM-ALBUTEROL 0.5-2.5 (3) MG/3ML IN SOLN: 3.0000 mL | Freq: Four times a day (QID) | RESPIRATORY_TRACT | Status: DC | PRN

## 2021-08-21 NOTE — Consult Note (Addendum)
Patient ID: Brianna Briggs, female   DOB: 1950/08/16, 71 y.o.   MRN: [id number]  HPI Brianna Briggs is a 71 y.o. 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 She presents for evaluation of several symptoms including some soreness in her right neck, some tightness in the left chest, ongoing cough not significantly different since she left as well as unchanged dyspnea,  ongoing nausea and some soreness in the inside left knee that she states started during physical therapy earlier today.  She has not had any fevers or other chest pain, headache or earache or sore throat.  She endorses very little appetite.  She states she is also had a little bit of burning with urination on and off for the last couple days.  She has not had any falls or injuries.  She denies any abdominal pain this am. She has failure to thrive, has been dehydrated and has worsend respiratory status. Her Bp has HR has responded appropriately to fluids. CT pers. Reviewed, no evidence of leaks, free air or inflammatory response around anastomosis , two samll pelvic collection, not causing sepsis  Pertinent Labs/Diagnostics Findings: Chemistry:Na+/ K+:134/4.8  Glucose: BUN/Cr.:21/1.23.  AST/ALT:44/34 CBC: WBC: 15.5 Other Lab findings:   PCT: 0.16.  Lactic acid: 2.3   I  HPI  Past Medical History:  Diagnosis Date   Allergic genetic state    Breast cancer, right ([place]) 1996   Mastectomy   History of chicken pox    History of colon polyps    Hyperlipidemia    Single kidney    Squamous cell carcinoma of skin 01/27/2019   Right inferior lateral thigh. KA-type    Past Surgical History:  Procedure Laterality Date   ABDOMINAL HYSTERECTOMY     COLONOSCOPY WITH PROPOFOL N/A 05/01/2019   Procedure: COLONOSCOPY WITH  PROPOFOL;  Surgeon: Toledo, Benay Pike, MD;  Location: [facility];  Service: Gastroenterology;  Laterality: N/A;   ESOPHAGOGASTRODUODENOSCOPY (EGD) WITH PROPOFOL N/A 05/01/2019   Procedure: ESOPHAGOGASTRODUODENOSCOPY (EGD) WITH PROPOFOL;  Surgeon: Toledo, Benay Pike, MD;  Location: [facility] END[facility]COPY;  Service: Gastroenterology;  Laterality: N/A;   IR CATHETER TUBE CHANGE  06/29/2021   IR RADIOLOGIST EVAL & MGMT  06/23/2021   IR RADIOLOGIST EVAL & MGMT  07/14/2021   LAPAROTOMY N/A 07/31/2021   Procedure: EXPLORATORY LAPAROTOMY;  Surgeon: Herbert Pun, MD;  Location: [facility] ORS;  Service: General;  Laterality: N/A;   MASTECTOMY     PARTIAL COLECTOMY N/A 07/31/2021   Procedure: Oliver Hum ILEOSTOMY;  Surgeon: Herbert Pun, MD;  Location: [facility] ORS;  Service: General;  Laterality: N/A;   SHOULDER ARTHROSCOPY WITH SUBACROMIAL DECOMPRESSION, ROTATOR CUFF REPAIR AND BICEP TENDON REPAIR Left 01/13/2008   TONSILLECTOMY     TUBAL LIGATION      Family History  Problem Relation Age of Onset   Cancer Father    Kidney failure Mother     Social History Social History   Tobacco Use   Smoking status: Every Day    Packs/day: 1.50    Years: 40.00    Total pack years: 60.00    Types: Cigarettes   Smokeless tobacco: Never  Vaping Use   Vaping Use: Never used  Substance Use Topics   Alcohol use: No   Drug use: No    Allergies  Allergen Reactions   Amoxicillin-Pot Clavulanate Diarrhea  and Rash    Other reaction(s): Unknown   Biaxin [Clarithromycin] Diarrhea    Other reaction(s): Unknown   Oxycodone Itching   Codeine Rash   Nitrofurantoin Rash    Current Facility-Administered Medications  Medication Dose Route Frequency Provider Last Rate Last Admin   0.9 %  sodium chloride infusion  250 mL Intravenous Continuous Ouma, Bing Neighbors, NP       acetaminophen (TYLENOL) tablet 650 mg  650 mg Oral Q6H PRN Teressa Lower, NP   650 mg at 08/21/21 1010   budesonide (PULMICORT) nebulizer  solution 0.25 mg  0.25 mg Nebulization BID Lang Snow, NP   0.25 mg at 08/21/21 0805   Chlorhexidine Gluconate Cloth 2 % PADS 6 each  6 each Topical Q0600 Ottie Glazier, MD   6 each at 08/21/21 0129   dextrose 5 % in lactated ringers infusion   Intravenous Continuous Lang Snow, NP 125 mL/hr at 08/21/21 0900 Infusion Verify at 08/21/21 0900   docusate sodium (COLACE) capsule 100 mg  100 mg Oral BID PRN Lang Snow, NP       heparin injection 5,000 Units  5,000 Units Subcutaneous Q8H Lang Snow, NP   5,000 Units at 08/21/21 0622   ipratropium-albuterol (DUONEB) 0.5-2.5 (3) MG/3ML nebulizer solution 3 mL  3 mL Nebulization Q6H PRN Lang Snow, NP       ipratropium-albuterol (DUONEB) 0.5-2.5 (3) MG/3ML nebulizer solution 3 mL  3 mL Nebulization Q6H Ouma, Bing Neighbors, NP   3 mL at 08/21/21 0805   norepinephrine (LEVOPHED) '4mg'$  in 268m (0.016 mg/mL) premix infusion  2-10 mcg/min Intravenous Titrated OLang Snow NP 22.5 mL/hr at 08/21/21 1102 6 mcg/min at 08/21/21 1102   piperacillin-tazobactam (ZOSYN) IVPB 3.375 g  3.375 g Intravenous Q8H NTeressa Lower NP 12.5 mL/hr at 08/21/21 1058 3.375 g at 08/21/21 1058   polyethylene glycol (MIRALAX / GLYCOLAX) packet 17 g  17 g Oral Daily PRN OLang Snow NP       scopolamine (TRANSDERM-SCOP) 1 MG/3DAYS 1.5 mg  1 patch Transdermal Q72H SLucrezia Starch MD   1.5 mg at 08/21/21 0144     Review of Systems Full ROS  was asked and was negative except for the information on the HPI  Physical Exam Blood pressure (!) 89/55, pulse 78, temperature 98.2 F (36.8 C), temperature source Oral, resp. rate 20, height '5\' 2"'$  (1.575 m), weight 50.2 kg, SpO2 99 %. CONSTITUTIONAL: Debilitated, malnourished on home O2. EYES: Pupils are equal, round, Sclera are non-icteric. EARS, NOSE, MOUTH AND THROAT: The oropharynx is clear. The oral mucosa is pink and moist. Hearing is intact to  voice. LYMPH NODES:  Lymph nodes in the neck are normal. RESPIRATORY:  Wearing O2 [place]. Some resp effort.  Some slight rhonchi at the bases bialterally.  Tachypneic CARDIOVASCULAR: Heart is regular without murmurs, gallops, or rubs. GI: The abdomen is  soft, nontender, and nondistended. There are no palpable masses. Prior midline incision healed, no infection. Ostomy working well. No peritonitis. There is no hepatosplenomegaly. There are normal bowel sounds  GU: Rectal deferred.   MUSCULOSKELETAL: Normal muscle strength and tone. No cyanosis or edema.   SKIN: Turgor is good and there are no pathologic skin lesions or ulcers. NEUROLOGIC: Motor and sensation is grossly normal. Cranial nerves are grossly intact. PSYCH:  Oriented to person, place and time. Affect is normal.  Data Reviewed  I have personally reviewed the patient's imaging, laboratory findings and medical records.  Assessment/Plan 71 year old female with multiple comorbidities including chronic respiratory failure and the main issue is failure to thrive admitted for dehydration and sepsis likely from pulmonary origin.  I Do not think the collections within the pelvis are necessary causing significant issues at this time.  L don't think that draining this is feasible and high yield.  I do think that she needs aggressive fluid resuscitation as well as potential control of the output from the ostomy.  She also needs to improve her nutrition and her pulmonary function.  No need for surgical intervention at this time.  I will make sure that I will talk to Dr. Windell Moment regarding this case.  Not planning on doing any surgical intervention.  I have written for some albumin to improve her intravascular space. I spent > 75 minutes in this encounter including personally reviewing imaging studies, coordinating care, placing orders, performing appropriate documentation   Caroleen Hamman, MD FACS General Surgeon 08/21/2021, 11:40 AM

## 2021-08-21 NOTE — Consult Note (Signed)
PHARMACY CONSULT NOTE - FOLLOW UP  Pharmacy Consult for Electrolyte Monitoring and Replacement   Recent Labs: Potassium (mmol/L)  Date Value  08/21/2021 3.7   Magnesium (mg/dL)  Date Value  08/21/2021 1.6 (L)   Calcium (mg/dL)  Date Value  08/21/2021 8.7 (L)   Albumin (g/dL)  Date Value  08/20/2021 3.0 (L)   Phosphorus (mg/dL)  Date Value  08/21/2021 3.2   Sodium (mmol/L)  Date Value  08/21/2021 139     Assessment: 71 y.o female with significant PMH of Chronic respiratory failure with hypoxia, COVID-19 virus infection, MDD (major depressive disorder), Hyperlipidemia, mixed, Tobacco abuse, Chronic obstructive pulmonary disease (COPD) ([place]), Right Breast, Cancer s/p mastectomy, CAD, PAD, Solitary Kidney, recurrent Diverticulitis w/perforation and abscess s/p partial colectomy with anastomosis and loop ileostomy creation 07/31/21 who presented to the ED with chief complaints of intermittent abdominal pain associated with n/v and generalized weakness.  On D5LR @ 125 ml/hr.   Goal of Therapy:  WNL  Plan:  Medical team ordered Mg 4 g IV x1.  F/u with AM labs.   Oswald Hillock ,PharmD Clinical Pharmacist 08/21/2021 8:42 AM

## 2021-08-21 NOTE — Progress Notes (Signed)
Patient arrived to the unit, comfort afforded. VVS, denies any pain.   Will continue to monitor.

## 2021-08-21 NOTE — IPAL (Signed)
  Interdisciplinary Goals of Care Family Meeting   Date carried out: 08/21/2021  Location of the meeting: Bedside  Member's involved: Nurse Practitioner, Bedside Registered Nurse, and Family Member or next of kin  Durable Power of Attorney or acting medical decision maker: Pt and pts husband Orlando Thalmann   Discussion: We discussed goals of care for [employer] .  Decline in pt condition and pt HIGH RISK for Cardiac Arrest. Also,discussed code status and goals of treatment   Code status: Full Code but would not want tracheostomy or PEG tube per pt request  Disposition: Continue current acute care.   Time spent for the meeting: 30 minutes     Donell Beers, [place] Pager [phone] (please enter 7 digits) [place] Pager [phone] (please enter 7 digits)

## 2021-08-21 NOTE — ED Notes (Signed)
IV Team at bedside 

## 2021-08-21 NOTE — Progress Notes (Signed)
An USGPIV (ultrasound guided PIV) has been placed for short-term vasopressor infusion. A correctly placed ivWatch must be used when administering Vasopressors. Should this treatment be needed beyond 72 hours, central line access should be obtained.  It will be the responsibility of the bedside nurse to follow best practice to prevent extravasations.   ?

## 2021-08-21 NOTE — Procedures (Signed)
Central Venous Catheter Insertion Procedure Note  Brianna Briggs  [id number]  [birth year]  Date:08/21/21  Time:7:55 PM   Provider Performing:Vern Guerette Micheline Chapman   Procedure: Insertion of Non-tunneled Central Venous [id number]) with US guidance (77939)   Indication(s) Medication administration and Difficult access  Consent Risks of the procedure as well as the alternatives and risks of each were explained to the patient and/or caregiver.  Consent for the procedure was obtained and is signed in the bedside chart  Anesthesia Topical only with 1% lidocaine   Timeout Verified patient identification, verified procedure, site/side was marked, verified correct patient position, special equipment/implants available, medications/allergies/relevant history reviewed, required imaging and test results available.  Sterile Technique Maximal sterile technique including full sterile barrier drape, hand hygiene, sterile gown, sterile gloves, mask, hair covering, sterile ultrasound probe cover (if used).  Procedure Description Area of catheter insertion was cleaned with chlorhexidine and draped in sterile fashion.  With real-time ultrasound guidance a central venous catheter was placed into the left femoral vein. Nonpulsatile blood flow and easy flushing noted in all ports.  The catheter was sutured in place and sterile dressing applied.  Complications/Tolerance None; patient tolerated the procedure well. Chest X-ray is ordered to verify placement for internal jugular or subclavian cannulation.   Chest x-ray is not ordered for femoral cannulation.  EBL Minimal  Specimen(s) None  Attempted to place left internal jugular central line, however unable to advance guidewire.  Successfully placed left femoral central line.  Donell Beers, [place] Pager [phone] (please enter 7 digits) PCCM Consult Pager [phone] (please enter 7 digits)

## 2021-08-21 NOTE — Progress Notes (Signed)
Progress Note  Called to bedside due to pt having frequent runs of ventricular tachycardia.  Pt never lost pulse and remained alert and oriented. However, developed worsening hypotension sbp 60's despite levophed gtt infusing '@10'$  mcg/min.  Consulted [facility] Cardiologist Dr. Humphrey Rolls and spoke with him via telephone for recommendations.  Informed Dr. Humphrey Rolls this afternoon pt had periods of bradycardia with intermittent pauses heart rate 30-40's that resolved spontaneously, therefore cautious regarding initiating antiarrhythmics.  Dr. Humphrey Rolls recommended administering 2 grams of iv magnesium and 40 meq of iv potassium.  Discussed plan of care with bedside RN along with pt and her husband.  Pts bp stabilized with levophed gtt '@18'$  mcg/min. Also, spoke with general surgery to discuss if they recommended a repeat CT Abd Pelvis due to increase pressor requirement.  Dr. Dahlia Byes felt symptoms were not secondary to an intraabdominal process, therefore did not recommend repeat CT Abd Pelvis   Donell Beers, [place] Pager [phone] (please enter 7 digits) PCCM Consult Pager [phone] (please enter 7 digits)

## 2021-08-21 NOTE — Progress Notes (Signed)
[facility] Progress Note Patient Name: Brianna Briggs DOB: June 20, 1950 MRN: [id number]   Date of Service  08/21/2021  HPI/Events of Note  PATIENT ADMITTED WITH SEPTIC SHOCK WITH A SUSPECTED INTRA-ABDOMINAL FOCUS, CT CHEST SUGGESTIVE OF ASPIRATION.  eICU Interventions  NEW PATIENT EVALUATION.        Jusitn Salsgiver U Grason [place] 08/21/2021, 2:22 AM

## 2021-08-21 NOTE — H&P (Addendum)
NAME:  Brianna Briggs, MRN:  [id number], DOB:  [birth year], LOS: 0 ADMISSION DATE:  08/20/2021, CONSULTATION DATE:  08/21/2021 REFERRING MD: Hulan Saas, CHIEF COMPLAINT: Abdominal pain and hypertension  HPI  71 y.o female with significant PMH of Chronic respiratory failure with hypoxia, COVID-19 virus infection, MDD (major depressive disorder), Hyperlipidemia, mixed, Tobacco abuse, Chronic obstructive pulmonary disease (COPD) ([place]), Right Breast, Cancer s/p mastectomy, CAD, PAD, Solitary Kidney, recurrent Diverticulitis w/perforation and abscess s/p partial colectomy with anastomosis and loop ileostomy creation 07/31/21 who presented to the ED with chief complaints of intermittent abdominal pain associated with n/v and generalized weakness.  Patient was recently admitted to the hospital from 7/12-8/1 with epigastric pain. Her prior CT abd/pelvis showed persistent abscess attributed to acute sigmoid diverticulitis. She had initially been treated with percutaneous drainage and IV abx but failed and ultimately underwent sigmoid resection with anastomosis and loop ileostomy creation on 7/16. She was also found to have COVID infection and was treated  with remdesevir and steroids. She was discharged to SNF for short term rehab. Since discharge patient report decreased po intake, intermittent abdominal pain, chills, nausea and vomiting. Staff at the SNF noticed that her bp was very low so sent her to the ED for further evaluation.  ED Course: In the emergency department, the temperature was 37.1C, the heart rate136  beats/minute, the blood 83/60 pressure  mm Hg, the respiratory rate 26  breaths/minute, and the oxygen saturation 95% on 2L.  Pertinent Labs/Diagnostics Findings: Chemistry:Na+/ K+:134/4.8  Glucose: BUN/Cr.:21/1.23.  AST/ALT:44/34 CBC: WBC: 15.5 Other Lab findings:   PCT: 0.16.  Lactic acid: 2.3   Imaging:  CXR> CTA Chest> CT Abd/pelvis>see significant diagnostic tests below  Patient  given 30 cc/kg of fluids and started on broad-spectrum antibiotics Vanco cefepime and Flagyl for sepsis with septic shock due to suspected intraabdominal source. Due to abdominal finding on CT abd/pelvis concerning for abscess/infection, general surgery was consulted who recommended to continue with abx as above and will eval in the am. Patient remained hypotensive despite IVF boluses therefore was started on Levophed. PCCM consulted.  Past Medical History  Recurrent Diverticulitis with abscess s/p partial colectomy Acute on chronic respiratory failure with hypoxia ([facility]) COVID-19 virus infection MDD (major depressive disorder) Hyperlipidemia, mixed Tobacco abuse Chronic obstructive pulmonary disease (COPD) ([place]) Right Breast Cancer s/p mastectomy CAD PAD Solitary Kidney  Significant Hospital Events   8/6: Admitted to ICU w/sepsis due to suspected intraabdominal source and possible aspiration pneumonia  Consults:  General surgery  Procedures:  None  Significant Diagnostic Tests:  8/4: Chest Xray>No active disease 8/4: CTA abdomen and pelvis>1. Interval loop ileostomy in the right abdomen with small peristomal hernia. No bowel obstruction. 2. Two small right pelvic fluid collections, 2.6 and 2.3 cm largest dimension respectively. Sterility is indeterminate, although the presence of peripheral enhancement suggests abscess/infection. 3. Layering stones or sludge in the gallbladder without CT findings of acute cholecystitis. 8/4: CTA Chest>1. No pulmonary embolus. 2. Right middle lobe collapse with frothy debris in the right middle lobe bronchus, may be mucous plugging or seen in the setting of aspiration. 3. Moderate to advanced emphysema. 4. Scattered pulmonary nodules are stable from prior exam, largest measuring 6 mm in the right upper lobe. Recommend continued annual lung cancer screening CT. 5. Lipomatous hypertrophy of the intra-atrial septum.  Micro Data:  8/4:  SARS-CoV-2 PCR> positive on 7/12 8/4: Blood culture x2> 8/4: Urine Culture> 8/4: MRSA PCR>>  8/4: Strep pneumo urinary antigen> 8/4: Legionella urinary  antigen>  Antimicrobials:  Vancomycin 8/4> Cefepime 8/4> Metronidazole 8/4>  OBJECTIVE  Blood pressure 90/61, pulse (!) 51, temperature 98.5 F (36.9 C), resp. rate 14, height '5\' 2"'$  (1.575 m), weight 50.2 kg, SpO2 99 %.        Intake/Output Summary (Last 24 hours) at 08/21/2021 0047 Last data filed at 08/20/2021 2242 Gross per 24 hour  Intake 3400 ml  Output --  Net 3400 ml   Filed Weights   08/20/21 1708  Weight: 50.2 kg   Physical Examination  GENERAL: 71 year-old  female critically ill patient lying in the bed with no acute distress.  EYES: Pupils equal, round, reactive to light and accommodation. No scleral icterus. Extraocular muscles intact.  HEENT: Head atraumatic, normocephalic. Oropharynx and nasopharynx clear.  NECK:  Supple, no jugular venous distention. No thyroid enlargement, no tenderness.  LUNGS: Normal breath sounds bilaterally, no wheezing, rales,rhonchi or crepitation. No use of accessory muscles of respiration.  CARDIOVASCULAR: S1, S2 normal. No murmurs, rubs, or gallops.  ABDOMEN: Soft, tender to palpation , nondistended. Bowel sounds present. No organomegaly or mass. Right colectomy EXTREMITIES: No pedal edema, cyanosis, or clubbing.  NEUROLOGIC: Cranial nerves II through XII are intact.  Muscle strength 5/5 in all extremities. Sensation intact. Gait not checked.  PSYCHIATRIC: The patient is alert and oriented x 3.  SKIN: No obvious rash, lesion, or ulcer.   Labs/imaging that I havepersonally reviewed  (right click and "Reselect all SmartList Selections" daily)     Labs   CBC: Recent Labs  Lab 08/20/21 1708  WBC 15.5*  NEUTROABS 13.4*  HGB 13.5  HCT 42.7  MCV 88.4  PLT 989    Basic Metabolic Panel: Recent Labs  Lab 08/15/21 0609 08/20/21 1708  NA 140 134*  K 3.7 4.8  CL 106 100  CO2  26 24  GLUCOSE 96 109*  BUN 22 21  CREATININE 0.60 1.23*  CALCIUM 9.2 9.4  PHOS  --  3.4   GFR: Estimated Creatinine Clearance: 33.2 mL/min (A) (by C-G formula based on SCr of 1.23 mg/dL (H)). Recent Labs  Lab 08/20/21 1708 08/20/21 1709 08/20/21 2011  PROCALCITON 0.16  --   --   WBC 15.5*  --   --   LATICACIDVEN  --  2.3* 1.3    Liver Function Tests: Recent Labs  Lab 08/20/21 1708  AST 44*  ALT 34  ALKPHOS 114  BILITOT 0.6  PROT 7.7  ALBUMIN 3.0*   No results for input(s): "LIPASE", "AMYLASE" in the last 168 hours. No results for input(s): "AMMONIA" in the last 168 hours.  ABG No results found for: "PHART", "PCO2ART", "PO2ART", "HCO3", "TCO2", "ACIDBASEDEF", "O2SAT"   Coagulation Profile: Recent Labs  Lab 08/20/21 1708  INR 1.1    Cardiac Enzymes: No results for input(s): "CKTOTAL", "CKMB", "CKMBINDEX", "TROPONINI" in the last 168 hours.  HbA1C: No results found for: "HGBA1C"  CBG: No results for input(s): "GLUCAP" in the last 168 hours.  Review of Systems:   Review of Systems  Constitutional:  Positive for chills, fever, malaise/fatigue and weight loss.  HENT: Negative.    Eyes: Negative.   Respiratory:  Positive for cough. Negative for hemoptysis, sputum production, shortness of breath and wheezing.   Cardiovascular: Negative.   Gastrointestinal:  Positive for abdominal pain, diarrhea, nausea and vomiting. Negative for blood in stool, constipation, heartburn and melena.  Genitourinary:  Negative for dysuria, flank pain, frequency, hematuria and urgency.  Musculoskeletal:  Positive for back pain, myalgias and neck pain.  Negative for falls and joint pain.  Skin: Negative.   Neurological: Negative.   Endo/Heme/Allergies: Negative.   Psychiatric/Behavioral:  Positive for depression. The patient is nervous/anxious.    Past Medical History  She,  has a past medical history of Allergic genetic state, Breast cancer, right ([place]) (1996), History of chicken  pox, History of colon polyps, Hyperlipidemia, Single kidney, and Squamous cell carcinoma of skin (01/27/2019).   Surgical History    Past Surgical History:  Procedure Laterality Date   ABDOMINAL HYSTERECTOMY     COLONOSCOPY WITH PROPOFOL N/A 05/01/2019   Procedure: COLONOSCOPY WITH PROPOFOL;  Surgeon: Toledo, Benay Pike, MD;  Location: [facility] ENDOSCOPY;  Service: Gastroenterology;  Laterality: N/A;   ESOPHAGOGASTRODUODENOSCOPY (EGD) WITH PROPOFOL N/A 05/01/2019   Procedure: ESOPHAGOGASTRODUODENOSCOPY (EGD) WITH PROPOFOL;  Surgeon: Toledo, Benay Pike, MD;  Location: [facility] ENDOSCOPY;  Service: Gastroenterology;  Laterality: N/A;   IR CATHETER TUBE CHANGE  06/29/2021   IR RADIOLOGIST EVAL & MGMT  06/23/2021   IR RADIOLOGIST EVAL & MGMT  07/14/2021   LAPAROTOMY N/A 07/31/2021   Procedure: EXPLORATORY LAPAROTOMY;  Surgeon: Herbert Pun, MD;  Location: [facility] ORS;  Service: General;  Laterality: N/A;   MASTECTOMY     PARTIAL COLECTOMY N/A 07/31/2021   Procedure: Oliver Hum ILEOSTOMY;  Surgeon: Herbert Pun, MD;  Location: [facility] ORS;  Service: General;  Laterality: N/A;   SHOULDER ARTHROSCOPY WITH SUBACROMIAL DECOMPRESSION, ROTATOR CUFF REPAIR AND BICEP TENDON REPAIR Left 01/13/2008   TONSILLECTOMY     TUBAL LIGATION       Social History   reports that she has been smoking cigarettes. She has a 60.00 pack-year smoking history. She has never used smokeless tobacco. She reports that she does not drink alcohol and does not use drugs.   Family History   Her family history includes Cancer in her father; Kidney failure in her mother.   Allergies Allergies  Allergen Reactions   Amoxicillin-Pot Clavulanate Diarrhea and Rash    Other reaction(s): Unknown   Biaxin [Clarithromycin] Diarrhea    Other reaction(s): Unknown   Oxycodone Itching   Codeine Rash   Nitrofurantoin Rash     Home Medications  Prior to Admission medications   Medication Sig Start Date End Date Taking? Authorizing Provider   acidophilus (RISAQUAD) CAPS capsule Take 1 capsule by mouth 3 (three) times daily for 14 days. 08/16/21 08/30/21  Ezekiel Slocumb, DO  alum & mag hydroxide-simeth (MAALOX/MYLANTA) 200-200-20 MG/5ML suspension Take 15 mLs by mouth every 4 (four) hours as needed for indigestion or heartburn. 08/16/21   Ezekiel Slocumb, DO  Cholecalciferol 25 MCG (1000 UT) capsule Take 1,000 Units by mouth daily. 04/27/21   [provider]  guaiFENesin (MUCINEX) 600 MG 12 hr tablet Take 1 tablet (600 mg total) by mouth 2 (two) times daily. 08/16/21   Ezekiel Slocumb, DO  guaiFENesin-dextromethorphan (ROBITUSSIN DM) 100-10 MG/5ML syrup Take 10 mLs by mouth every 4 (four) hours as needed for cough. 08/16/21   Ezekiel Slocumb, DO  HYDROcodone-acetaminophen (NORCO) 7.5-325 MG tablet Take 1 tablet by mouth every 4 (four) hours as needed for severe pain. 08/16/21   Ezekiel Slocumb, DO  methocarbamol (ROBAXIN) 500 MG tablet Take 1 tablet (500 mg total) by mouth every 8 (eight) hours as needed for muscle spasms. 08/16/21   Ezekiel Slocumb, DO  nicotine (NICODERM CQ - DOSED IN MG/24 HOURS) 21 mg/24hr patch Place 1 patch (21 mg total) onto the skin daily as needed (nicotine craving). 08/16/21   Arbutus Ped,  Kelly A, DO  nystatin (MYCOSTATIN/NYSTOP) powder Apply 1 Application topically 2 (two) times daily as needed. 05/13/21   [provider]  ondansetron (ZOFRAN) 4 MG tablet Take 1 tablet (4 mg total) by mouth every 6 (six) hours as needed for nausea. 08/16/21   Ezekiel Slocumb, DO  traMADol (ULTRAM) 50 MG tablet Take 1 tablet (50 mg total) by mouth every 6 (six) hours as needed for moderate pain. 08/16/21   Ezekiel Slocumb, DO  VENTOLIN HFA 108 (90 Base) MCG/ACT inhaler Inhale 2 puffs into the lungs 2 (two) times daily. Patient not taking: Reported on 07/27/2021 04/12/21   [provider]  Scheduled Meds:  Chlorhexidine Gluconate Cloth  6 each Topical Q0600   heparin  5,000 Units Subcutaneous Q8H   scopolamine   1 patch Transdermal Q72H   Continuous Infusions:  sodium chloride     dextrose 5% lactated ringers     lactated ringers 100 mL/hr at 08/20/21 2257   norepinephrine (LEVOPHED) Adult infusion     PRN Meds:.docusate sodium, polyethylene glycol   Active Hospital Problem list     Assessment & Plan:   Sepsis with Septic Shock due to suspected Intraabdominal source and possible Aspiration Pneumonia Lactic: 2.3, Baseline PCT: 0.16, UA: -, CXR: -, CT: Abscess/infection, aspiration pneumonia/mucus plug?  Initial interventions/workup included: 3 L LR & Cefepime/ Vancomycin/ Metronidazole -Supplemental oxygen as needed, to maintain SpO2 > 90% -F/u cultures, trend lactic/ PCT -Monitor WBC/ fever curve -Continue IV antibiotics: cefepime & vancomycin & Flagyl pending cultures -IVF hydration as needed -Pressors for MAP goal >65 -Strict I/O's  Hx of Recurrent Diverticulitis w/perforation and abscess  s/p partial colectomy with anastomosis and loop ileostomy creation 07/31/21 CT/Abd/pelvis showsright pelvis fluid collections concerning for abscess/infection -Keep NPO -IVFs hydration -IV abx as above -Discussed with general surgery on call with recommend conservative management as above will follow in the am   Acute on Chronic Hypoxic Respiratory Failure due to suspected Aspiration Pneumonia vs mucus plug COPD without evidence of acute exacerbation Hx: Current everyday smoker -Supplemental O2 as needed to maintain O2 saturations 88 to 92% -High risk for intubation -Follow intermittent ABG and chest x-ray as needed -Scheduled and As needed bronchodilators -Encourage smoking cessation  Acute Kidney Injury likely ATN in the setting of hypotension -Monitor I&O's / urinary output -Follow BMP -Ensure adequate renal perfusion -Avoid nephrotoxic agents as able -Replace electrolytes as indicated     Best practice:  Diet:  NPO Pain/Anxiety/Delirium protocol (if indicated): No VAP protocol  (if indicated): Not indicated DVT prophylaxis: Subcutaneous Heparin GI prophylaxis: N/A Glucose control:  SSI No Central venous access:  N/A Arterial line:  N/A Foley:  N/A Mobility:  bed rest  PT consulted: N/A Last date of multidisciplinary goals of care discussion [8/5] Code Status:  full code Disposition: ICU   = Goals of Care = Code Status Order: FULL  Primary Emergency Contact: Traniyah, [place], Home Phone: [phone] Wishes to pursue full aggressive treatment and intervention options, including CPR and intubation, but goals of care will be addressed on going with family if that should become necessary.  Critical care time: 45 minutes       Rufina Falco, DNP, CCRN, FNP-C, AGACNP-BC Acute Care Nurse Practitioner [facility]  PCCM on call pager [phone] until 7 am

## 2021-08-21 NOTE — Progress Notes (Signed)
Patient had multiple episodes of non sustained bradycardia. Patient sleeping and asymptomatic when woken up. Dr. Lanney Gins made aware. EKG ordered and obtained, new lab orders placed. Awaiting results.

## 2021-08-22 ENCOUNTER — Encounter: Payer: Self-pay | Admitting: Pulmonary Disease

## 2021-08-22 ENCOUNTER — Inpatient Hospital Stay (HOSPITAL_COMMUNITY)
Admit: 2021-08-22 | Discharge: 2021-08-22 | Disposition: A | Discharge status: Home / Self Care | Payer: Medicare Other | Attending: Critical Care Medicine | Admitting: Critical Care Medicine

## 2021-08-22 DIAGNOSIS — R9431 Abnormal electrocardiogram [ECG] [EKG]: Secondary | ICD-10-CM | POA: Diagnosis not present

## 2021-08-22 DIAGNOSIS — Z515 Encounter for palliative care: Secondary | ICD-10-CM

## 2021-08-22 DIAGNOSIS — Z7189 Other specified counseling: Secondary | ICD-10-CM

## 2021-08-22 DIAGNOSIS — K651 Peritoneal abscess: Secondary | ICD-10-CM

## 2021-08-22 DIAGNOSIS — A419 Sepsis, unspecified organism: Secondary | ICD-10-CM | POA: Diagnosis not present

## 2021-08-22 DIAGNOSIS — R6520 Severe sepsis without septic shock: Secondary | ICD-10-CM | POA: Diagnosis not present

## 2021-08-22 LAB — URINE CULTURE: Culture: NO GROWTH

## 2021-08-22 LAB — LEGIONELLA PNEUMOPHILA SEROGP 1 UR AG: L. pneumophila Serogp 1 Ur Ag: NEGATIVE

## 2021-08-22 LAB — BASIC METABOLIC PANEL
Anion gap: 7 (ref 5–15)
BUN: 5 mg/dL — ABNORMAL LOW (ref 8–23)
CO2: 27 mmol/L (ref 22–32)
Calcium: 8.1 mg/dL — ABNORMAL LOW (ref 8.9–10.3)
Chloride: 100 mmol/L (ref 98–111)
Creatinine, Ser: 0.68 mg/dL (ref 0.44–1.00)
GFR, Estimated: >60 mL/min (ref >60)
Glucose, Bld: 171 mg/dL — ABNORMAL HIGH (ref 70–99)
Potassium: 4.4 mmol/L (ref 3.5–5.1)
Sodium: 134 mmol/L — ABNORMAL LOW (ref 135–145)

## 2021-08-22 LAB — CBC WITH DIFFERENTIAL/PLATELET
Abs Immature Granulocytes: 0.04 10*3/uL (ref 0.00–0.07)
Basophils Absolute: 0 10*3/uL (ref 0.0–0.1)
Basophils Relative: 0 %
Eosinophils Absolute: 0.1 10*3/uL (ref 0.0–0.5)
Eosinophils Relative: 1 %
HCT: 28.7 % — ABNORMAL LOW (ref 36.0–46.0)
Hemoglobin: 9.2 g/dL — ABNORMAL LOW (ref 12.0–15.0)
Immature Granulocytes: 0 %
Lymphocytes Relative: 9 %
Lymphs Abs: 1 10*3/uL (ref 0.7–4.0)
MCH: 28.4 pg (ref 26.0–34.0)
MCHC: 32.1 g/dL (ref 30.0–36.0)
MCV: 88.6 fL (ref 80.0–100.0)
Monocytes Absolute: 1 10*3/uL (ref 0.1–1.0)
Monocytes Relative: 9 %
Neutro Abs: 8.4 10*3/uL — ABNORMAL HIGH (ref 1.7–7.7)
Neutrophils Relative %: 81 %
Platelets: 187 10*3/uL (ref 150–400)
RBC: 3.24 MIL/uL — ABNORMAL LOW (ref 3.87–5.11)
RDW: 15.8 % — ABNORMAL HIGH (ref 11.5–15.5)
WBC: 10.4 10*3/uL (ref 4.0–10.5)
nRBC: 0 % (ref 0.0–0.2)

## 2021-08-22 LAB — ECHOCARDIOGRAM COMPLETE
Area-P 1/2: 3.27 cm2
Calc EF: 56.8 %
Height: 62 in
S' Lateral: 3.61 cm
Single Plane A2C EF: 56.5 %
Single Plane A4C EF: 57.5 %
Weight: 1798.95 oz

## 2021-08-22 LAB — TROPONIN I (HIGH SENSITIVITY)
Troponin I (High Sensitivity): 76 ng/L — ABNORMAL HIGH (ref <18)
Troponin I (High Sensitivity): 45 ng/L — ABNORMAL HIGH (ref <18)

## 2021-08-22 LAB — PHOSPHORUS: Phosphorus: 3 mg/dL (ref 2.5–4.6)

## 2021-08-22 LAB — CORTISOL: Cortisol, Plasma: 21.1 ug/dL

## 2021-08-22 LAB — HEMOGLOBIN AND HEMATOCRIT, BLOOD
HCT: 26.8 % — ABNORMAL LOW (ref 36.0–46.0)
HCT: 26.7 % — ABNORMAL LOW (ref 36.0–46.0)
Hemoglobin: 8.9 g/dL — ABNORMAL LOW (ref 12.0–15.0)
Hemoglobin: 8.9 g/dL — ABNORMAL LOW (ref 12.0–15.0)

## 2021-08-22 LAB — MAGNESIUM: Magnesium: 2.1 mg/dL (ref 1.7–2.4)

## 2021-08-22 MED ORDER — ONDANSETRON HCL 4 MG/2ML IJ SOLN
4.0000 mg | Freq: Four times a day (QID) | INTRAMUSCULAR | Status: DC | PRN
  Administered 2021-08-22 – 2021-08-23 (×3): 4 mg via INTRAVENOUS
  Filled 2021-08-22 (×3): qty 2

## 2021-08-22 MED ORDER — HYDROCODONE-ACETAMINOPHEN 5-325 MG PO TABS
1.0000 | ORAL_TABLET | Freq: Four times a day (QID) | ORAL | Status: DC | PRN
  Administered 2021-08-22: 2 via ORAL
  Administered 2021-08-27: 1 via ORAL
  Administered 2021-08-29 – 2021-08-31 (×2): 2 via ORAL
  Filled 2021-08-22: qty 1
  Filled 2021-08-22 (×3): qty 2

## 2021-08-22 MED ORDER — PANTOPRAZOLE SODIUM 40 MG IV SOLR
40.0000 mg | INTRAVENOUS | Status: DC
  Administered 2021-08-22 – 2021-08-23 (×2): 40 mg via INTRAVENOUS
  Filled 2021-08-22 (×2): qty 10

## 2021-08-22 NOTE — Progress Notes (Signed)
Overnight Update:  Patient remains on Levophed and Vasopressin. Reviewed CBC and Hgb 9.4 down from 10.8 on last check but down from 13.5 since admission 24 hours ago. Examined patient and complains of abdominal pain to palpation but states nothing that is a new pain. No obvious signs of bleeding. Reviewed prior CTA abdomen which showed fluid collections concerning for abscesses but given drop in Hgb and concern for possible bleeding as source for worsening hemodynamics, repeated CTA abdomen which showed no increase in size of fluid collections and no other new findings.   Vasopressor requirements have decreased over the course of the night from Levophed at 50mg down to 520m and Vaso remains at 0.03. No additional events of bradycardia or VT.   HS Troponin up to 104. Will trend Q6hr x2. Trend H&H.    StTonye RoyaltyCNP-BC

## 2021-08-22 NOTE — Progress Notes (Signed)
  Echocardiogram 2D Echocardiogram has been performed.  Brianna Briggs 08/22/2021, 9:58 AM

## 2021-08-22 NOTE — Consult Note (Signed)
Consultation Note Date: 08/22/2021   Patient Name: Brianna Briggs  DOB: 1950/05/21  MRN: [id number]  Age / Sex: 71 y.o., female  PCP: Kirk Ruths, MD Referring Physician: Ottie Glazier, MD  Reason for Consultation: Establishing goals of care and Psychosocial/spiritual support  HPI/Patient Profile: 71 y.o. 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  Hospitalized 712 through 8/1 with above, discharged to short-term rehab return to hospital after 2 to 3 days.  Clinical Assessment and Goals of Care: I have reviewed medical records including EPIC notes, labs and imaging, received report from RN, assessed the patient.  Mrs. Bartha is lying quietly in bed.  She greets me, making and somewhat keeping eye contact.  She is alert and oriented, able to make her basic needs known.  Her husband of 82 years, Henrene Pastor, is present at bedside.  Also present at bedside is nursing staff attending to her needs.  We meet at the bedside to discuss diagnosis prognosis, GOC, EOL wishes, disposition and options.  I introduced Palliative Medicine as specialized medical care for people living with serious illness. It focuses on providing relief from the symptoms and stress of a serious illness. The goal is to improve quality of life for both the patient and the family.  We discussed a brief life review of the patient.  Mr. and Mrs. Whan and have been married for 30 years.  Mrs. Minella has 1 child, her husband  has 3.  They talk of raising their family together.  They also have multiple grandchildren.  We then focused on their current illness.  We talk about Mrs. Heino's acute illness over the last few months in particular.  She tells me that she was independent with ADLs/IADLs a few months ago.  We talk about her heart trouble last evening, the treatment plan, medications for blood pressure.  We also talked about surgery consult and recommendations.   The natural disease trajectory and expectations at EOL were discussed.  Advanced directives, concepts specific to code status, artifical feeding and hydration, and rehospitalization were considered and discussed.  We talked about the concept of "treat the treatable, but allowing natural passing".  At this point Mrs. Denman and her husband would want attempted resuscitation but no tracheostomy.  Discussed the importance of continued conversation with family and the medical providers regarding overall plan of care and treatment options, ensuring decisions are within the context of the patient's values and GOCs.  Questions and concerns were addressed.  The patient and family was encouraged to call with questions or concerns.  PMT will continue to support holistically.  Conference with attending, bedside nursing staff, transition of care team related to patient condition, needs,  goals of care, disposition.   HCPOA  HCPOA -husband of 82 years, Aljean Horiuchi.  States they have completed paperwork.    SUMMARY OF RECOMMENDATIONS   At this point continue full scope/full code Time for outcomes PMT to follow   Code Status/Advance Care Planning: Full code -we talked about the concept of "treat the treatable, but allowing natural passing".  We talked about limits on life support such as no tracheostomy.  Symptom Management:  Per hospitalist/CCM, no additional needs at this time.  Palliative Prophylaxis:  Frequent Pain Assessment, Oral Care, and Turn  Reposition  Additional Recommendations (Limitations, Scope, Preferences): Full Scope Treatment  Psycho-social/Spiritual:  Desire for further Chaplaincy support:no Additional Recommendations: Caregiving  Support/Resources and ICU Family Guide  Prognosis:  Unable to determine, based on outcomes.  Guarded at this point.  Discharge Planning:  To be determined, based on outcomes.        Primary Diagnoses: Present on Admission:  Severe sepsis with lactic acidosis ([place])   I have reviewed the medical record, interviewed the patient and family, and examined the patient. The following aspects are pertinent.  Past Medical History:  Diagnosis Date   Allergic genetic state    Breast cancer, right ([place]) 1996   Mastectomy   History of chicken pox    History of colon polyps    Hyperlipidemia    Single kidney    Squamous cell carcinoma of skin 01/27/2019   Right inferior lateral thigh. KA-type   Social History   Socioeconomic History   Marital status: Married    Spouse name: Not on file   Number of children: Not on file   Years of education: Not on file   Highest education level: Not on file  Occupational History   Not on file  Tobacco Use   Smoking status: Every Day    Packs/day: 1.50    Years: 40.00    Total pack years: 60.00    Types: Cigarettes   Smokeless tobacco: Never  Vaping Use   Vaping Use: Never used  Substance and Sexual Activity   Alcohol use: No   Drug use: No   Sexual activity: Yes    Partners: Male  Other Topics Concern   Not on file  Social History Narrative   Lives at home with husband   Social Determinants of Health   Financial Resource Strain: Not on file  Food Insecurity: Not on file  Transportation Needs: Not on file  Physical Activity: Not on file  Stress: Not on file  Social Connections: Not on file   Family History  Problem Relation Age of Onset   Cancer Father    Kidney failure Mother    Scheduled Meds:  budesonide (PULMICORT)  nebulizer solution  0.25 mg Nebulization BID   Chlorhexidine Gluconate Cloth  6 each Topical Q0600   heparin  5,000 Units Subcutaneous Q8H   ipratropium-albuterol  3 mL Nebulization TID   loperamide  4 mg Oral Q8H   pantoprazole (PROTONIX) IV  40 mg Intravenous Q24H   scopolamine  1 patch Transdermal Q72H   Continuous Infusions:  sodium chloride     dextrose 5% lactated ringers 125 mL/hr at 08/22/21 1113   norepinephrine (LEVOPHED) Adult infusion 5 mcg/min (08/22/21 0920)   piperacillin-tazobactam (ZOSYN)  IV 3.375 g (08/22/21 1114)   vasopressin 0.03 Units/min (08/22/21 0441)   PRN Meds:.acetaminophen, atropine, docusate sodium, ipratropium-albuterol, morphine injection, ondansetron (ZOFRAN) IV, polyethylene glycol Medications Prior to Admission:  Prior to Admission medications   Medication  Sig Start Date End Date Taking? Authorizing Provider  acidophilus (RISAQUAD) CAPS capsule Take 1 capsule by mouth 3 (three) times daily for 14 days. 08/16/21 08/30/21 Yes Nicole Kindred A, DO  alum & mag hydroxide-simeth (MAALOX/MYLANTA) 200-200-20 MG/5ML suspension Take 15 mLs by mouth every 4 (four) hours as needed for indigestion or heartburn. 08/16/21  Yes Ezekiel Slocumb, DO  Cholecalciferol 25 MCG (1000 UT) capsule Take 1,000 Units by mouth daily. 04/27/21  Yes [provider]  guaiFENesin (MUCINEX) 600 MG 12 hr tablet Take 1 tablet (600 mg total) by mouth 2 (two) times daily. 08/16/21  Yes Nicole Kindred A, DO  guaiFENesin-dextromethorphan (ROBITUSSIN DM) 100-10 MG/5ML syrup Take 10 mLs by mouth every 4 (four) hours as needed for cough. 08/16/21  Yes Ezekiel Slocumb, DO  HYDROcodone-acetaminophen (NORCO) 7.5-325 MG tablet Take 1 tablet by mouth every 4 (four) hours as needed for severe pain. 08/16/21  Yes Nicole Kindred A, DO  methocarbamol (ROBAXIN) 500 MG tablet Take 1 tablet (500 mg total) by mouth every 8 (eight) hours as needed for muscle spasms. 08/16/21  Yes Nicole Kindred A, DO   nicotine (NICODERM CQ - DOSED IN MG/24 HOURS) 21 mg/24hr patch Place 1 patch (21 mg total) onto the skin daily as needed (nicotine craving). 08/16/21  Yes Nicole Kindred A, DO  nystatin (MYCOSTATIN/NYSTOP) powder Apply 1 Application topically 2 (two) times daily as needed. 05/13/21  Yes [provider]  ondansetron (ZOFRAN) 4 MG tablet Take 1 tablet (4 mg total) by mouth every 6 (six) hours as needed for nausea. 08/16/21  Yes Nicole Kindred A, DO  traMADol (ULTRAM) 50 MG tablet Take 1 tablet (50 mg total) by mouth every 6 (six) hours as needed for moderate pain. 08/16/21  Yes Nicole Kindred A, DO  VENTOLIN HFA 108 (90 Base) MCG/ACT inhaler Inhale 2 puffs into the lungs 2 (two) times daily. 04/12/21  Yes [provider]   Allergies  Allergen Reactions   Amoxicillin-Pot Clavulanate Diarrhea and Rash    Other reaction(s): Unknown   Biaxin [Clarithromycin] Diarrhea    Other reaction(s): Unknown   Oxycodone Itching   Codeine Rash   Nitrofurantoin Rash   Review of Systems  Unable to perform ROS: Acuity of condition    Physical Exam Vitals and nursing note reviewed.  Constitutional:      General: She is not in acute distress.    Appearance: She is ill-appearing.  HENT:     Mouth/Throat:     Mouth: Mucous membranes are moist.  Cardiovascular:     Rate and Rhythm: Normal rate.  Pulmonary:     Effort: Pulmonary effort is normal. No respiratory distress.  Skin:    General: Skin is warm and dry.  Neurological:     Mental Status: She is alert and oriented to person, place, and time.  Psychiatric:        Mood and Affect: Mood normal.        Behavior: Behavior normal.     Vital Signs: BP 102/64   Pulse 77   Temp 99.6 F (37.6 C) (Axillary)   Resp 17   Ht '5\' 2"'$  (1.575 m)   Wt 51 kg   SpO2 93%   BMI 20.56 kg/m  Pain Scale: 0-10   Pain Score: Asleep   SpO2: SpO2: 93 % O2 Device:SpO2: 93 % O2 Flow Rate: .O2 Flow Rate (L/min): 2 L/min  IO: Intake/output  summary:  Intake/Output Summary (Last 24 hours) at 08/22/2021 1343 Last data filed  at 08/22/2021 0600 Gross per 24 hour  Intake 1080.05 ml  Output 2270 ml  Net -1189.95 ml    LBM: Last BM Date : 08/21/21 (via ostomy) Baseline Weight: Weight: 50.2 kg Most recent weight: Weight: 51 kg     Palliative Assessment/Data:   Flowsheet Rows    Flowsheet Row Most Recent Value  Intake Tab   Referral Department Hospitalist  Unit at Time of Referral ICU  Palliative Care Primary Diagnosis Sepsis/Infectious Disease  Date Notified 08/21/21  Palliative Care Type New Palliative care  Reason for referral Clarify Goals of Care  Date of Admission 08/20/21  Date first seen by Palliative Care 08/22/21  # of days Palliative referral response time 1 Day(s)  # of days IP prior to Palliative referral 1  Clinical Assessment   Palliative Performance Scale Score 40%  Pain Max last 24 hours Not able to report  Pain Min Last 24 hours Not able to report  Dyspnea Max Last 24 Hours Not able to report  Dyspnea Min Last 24 hours Not able to report  Psychosocial & Spiritual Assessment   Palliative Care Outcomes        Time In: 1120 Time Out: 1235 Time Total: 75 minutes  Greater than 50%  of this time was spent counseling and coordinating care related to the above assessment and plan.  Signed by: Drue Novel, NP   Please contact Palliative Medicine Team phone at [phone] for questions and concerns.  For individual provider: See Shea Evans

## 2021-08-22 NOTE — Consult Note (Signed)
PHARMACY CONSULT NOTE  Pharmacy Consult for Electrolyte Monitoring and Replacement   Recent Labs: Potassium (mmol/L)  Date Value  08/22/2021 4.4   Magnesium (mg/dL)  Date Value  08/22/2021 2.1   Calcium (mg/dL)  Date Value  08/22/2021 8.1 (L)   Albumin (g/dL)  Date Value  08/20/2021 3.0 (L)   Phosphorus (mg/dL)  Date Value  08/22/2021 3.0   Sodium (mmol/L)  Date Value  08/22/2021 134 (L)     Assessment: 71 y.o female with significant PMH of Chronic respiratory failure with hypoxia, COVID-19 virus infection, MDD (major depressive disorder), Hyperlipidemia, mixed, Tobacco abuse, Chronic obstructive pulmonary disease (COPD) ([place]), Right Breast, Cancer s/p mastectomy, CAD, PAD, Solitary Kidney, recurrent Diverticulitis w/perforation and abscess s/p partial colectomy with anastomosis and loop ileostomy creation 07/31/21 who presented to the ED with chief complaints of intermittent abdominal pain associated with n/v and generalized weakness.  On D5LR @ 125 ml/hr.   Goal of Therapy:  WNL  Plan:  --no electrolyte replacement warranted for today F/u with AM labs.   Dallie Piles ,PharmD Clinical Pharmacist 08/22/2021 7:05 AM

## 2021-08-22 NOTE — Progress Notes (Signed)
Patient ID: Brianna Briggs, female   DOB: [birth year], 71 y.o.   MRN: [id number]     [facility] Day(s): 1.   Interval History: Patient seen and examined, no acute events or new complaints overnight. Patient reports continued having neck, back, abdominal pain.  In that case has been acute but back and abdominal pain has been chronic.  She endorses tolerating diet but not eating as much.  Denies any nausea or vomiting.  Denies any issues with ileostomy.  Due to increased in need of Levophed last night she had a repeat CT scan of the abdomen and pelvis that shows no change on the 2 cm fluid collection in the pelvis.  No significant pathology seen intra-abdominal leg to explain patient current refractory shock status.  Patient continues to be alert, oriented x3.  ICU team was able to slowly decrease Levophed.  Still on Levophed and vasopressin.  Vital signs in last 24 hours: [min-max] current  Temp:  [98.6 F (37 C)-99.9 F (37.7 C)] 99.6 F (37.6 C) (08/07 0915) Pulse Rate:  [42-109] 64 (08/07 0915) Resp:  [8-25] 17 (08/07 0915) BP: (61-131)/(33-97) 128/71 (08/07 0915) SpO2:  [88 %-100 %] 94 % (08/07 0915) Weight:  [51 kg] 51 kg (08/07 0500)     Height: '5\' 2"'$  (157.5 cm) Weight: 51 kg BMI (Calculated): 20.56   Physical Exam:  Constitutional: alert, cooperative and no distress  Respiratory: breathing non-labored at rest  Cardiovascular: regular rate and sinus rhythm  Gastrointestinal: soft, non-tender, and non-distended.  Ileostomy pink and patent  Labs:     Latest Ref Rng & Units 08/22/2021    3:18 AM 08/21/2021    8:30 PM 08/21/2021    4:13 AM  CBC  WBC 4.0 - 10.5 K/uL 10.4  8.6  10.6   Hemoglobin 12.0 - 15.0 g/dL 9.2  9.4  10.8   Hematocrit 36.0 - 46.0 % 28.7  28.6  33.0   Platelets 150 - 400 K/uL 187  182  219       Latest Ref Rng & Units 08/22/2021    3:18 AM 08/21/2021    3:10 PM 08/21/2021    4:13 AM  CMP  Glucose 70 - 99 mg/dL 171   183   BUN 8 - 23 mg/dL  5   14   Creatinine 0.44 - 1.00 mg/dL 0.68   0.87   Sodium 135 - 145 mmol/L 134   139   Potassium 3.5 - 5.1 mmol/L 4.4  3.7  3.7   Chloride 98 - 111 mmol/L 100   107   CO2 22 - 32 mmol/L 27   25   Calcium 8.9 - 10.3 mg/dL 8.1   8.7     Imaging studies: I was able to evaluate to CT scan done during this admission.  There is a 2 cm fluid collection in the pelvis.  No other free fluid or free air.  No significant changes to the rectosigmoid area and anastomosis.  I compared the CT scan with a last abdominal/pelvic CT scan done on July 14.  There was a small amount of free fluid in the pelvis at that time.  Comparing that Foley to the new 2 cm fluid collection the difference is that during the last CT scan that fluid seen on the pelvis is now more organized.  In my opinion this could represent an abscess, seroma, hematoma.   Assessment/Plan:  71 y.o. female with sepsis  Possible etiologies are small  intra-abdominal abscess versus pneumonia -The pelvic collection is 2 cm.  Comparing CT scan during this admission versus CT scan on July 29, 2021 there was similar fluid collection on July that now is more organized. -I think that this 2 cm fluid collection is very unlikely to cause the patient to be on septic shock in need of 2 pressors. -Her main complaints have been neck pain.  She does not have any worsening abdominal pain. -Patient initially presented with 15,000 white blood cell and has decreased to less than 10 in 24 hours. -There has been no fever in the last 24 hours -The ileostomy is working adequately.  No ileus.  Patient tolerating diet.  Other differential diagnosis is aspiration pneumonia versus bacterial pneumonia.  Patient has COPD, chronic smoker, recent history of COVID-19 infection.  She is very high risk of lung complications  If no other sources of sepsis explain her current status I can discuss case with IR to see if this collection can be aspirated.  Due to his size I know  that they cannot leave a drain but he may be amenable for aspiration.  If other sources of services are found I will try not to do any unnecessary procedures on this patient.  I will continue to follow closely.  Appreciate ICU team further management and work-up of septic shock and medical comorbidities.  I will continue to follow closely.   Arnold Long, MD

## 2021-08-22 NOTE — Progress Notes (Addendum)
NAME:  Brianna Briggs, MRN:  [id number], DOB:  19-Aug-1950, LOS: 1 ADMISSION DATE:  08/20/2021, CONSULTATION DATE:  08/21/2021 REFERRING MD:  Hulan Saas, MD, CHIEF COMPLAINT:  Abdominal pain   History of Present Illness:  71 y.o female with significant PMH of Chronic respiratory failure with hypoxia, COVID-19 virus infection, MDD (major depressive disorder), Hyperlipidemia, mixed, Tobacco abuse, Chronic obstructive pulmonary disease (COPD) ([place]), Right Breast, Cancer s/p mastectomy, CAD, PAD, Solitary Kidney, recurrent Diverticulitis w/perforation and abscess s/p partial colectomy with anastomosis and loop ileostomy creation 07/31/21 who presented to the ED with chief complaints of intermittent abdominal pain associated with n/v and generalized weakness.   Patient was recently admitted to the hospital from 7/12-8/1 with epigastric pain. Her prior CT abd/pelvis showed persistent abscess attributed to acute sigmoid diverticulitis. She had initially been treated with percutaneous drainage and IV abx but failed and ultimately underwent sigmoid resection with anastomosis and loop ileostomy creation on 7/16. She was also found to have COVID infection and was treated  with remdesevir and steroids. She was discharged to SNF for short term rehab. Since discharge patient report decreased po intake, intermittent abdominal pain, chills, nausea and vomiting. Staff at the SNF noticed that her bp was very low so sent her to the ED for further evaluation.   ED Course: In the emergency department, the temperature was 37.1C, the heart rate136  beats/minute, the blood 83/60 pressure  mm Hg, the respiratory rate 26  breaths/minute, and the oxygen saturation 95% on 2L.   Pertinent Labs/Diagnostics Findings: Chemistry:Na+/ K+:134/4.8  Glucose: BUN/Cr.:21/1.23.  AST/ALT:44/34 CBC: WBC: 15.5 Other Lab findings:   PCT: 0.16.  Lactic acid: 2.3   Imaging:  CXR> CTA Chest> CT Abd/pelvis>see significant diagnostic tests  below   Patient given 30 cc/kg of fluids and started on broad-spectrum antibiotics Vanco cefepime and Flagyl for sepsis with septic shock due to suspected intraabdominal source. Due to abdominal finding on CT abd/pelvis concerning for abscess/infection, general surgery was consulted who recommended to continue with abx as above and will eval in the am. Patient remained hypotensive despite IVF boluses therefore was started on Levophed. PCCM consulted.  Pertinent  Medical History  Recurrent Diverticulitis with abscess s/p partial colectomy Acute on chronic respiratory failure with hypoxia ([place]) COVID-19 virus infection MDD (major depressive disorder) Hyperlipidemia, mixed Tobacco abuse Chronic obstructive pulmonary disease (COPD) ([place]) Right Breast Cancer s/p mastectomy CAD PAD Solitary Kidney  Significant Diagnostic Tests:  8/4: Chest Xray>No active disease 8/4: CTA abdomen and pelvis>1. Interval loop ileostomy in the right abdomen with small peristomal hernia. No bowel obstruction. 2. Two small right pelvic fluid collections, 2.6 and 2.3 cm largest dimension respectively. Sterility is indeterminate, although the presence of peripheral enhancement suggests abscess/infection. 3. Layering stones or sludge in the gallbladder without CT findings of acute cholecystitis. 8/4: CTA Chest>1. No pulmonary embolus. 2. Right middle lobe collapse with frothy debris in the right middle lobe bronchus, may be mucous plugging or seen in the setting of aspiration. 3. Moderate to advanced emphysema. 4. Scattered pulmonary nodules are stable from prior exam, largest measuring 6 mm in the right upper lobe. Recommend continued annual lung cancer screening CT. 5. Lipomatous hypertrophy of the intra-atrial septum.  Micro Data:  8/4: SARS-CoV-2 PCR> positive on 7/12 8/4: Blood culture x2> 8/4: Urine Culture> 8/4: MRSA PCR>>  8/4: Strep pneumo urinary antigen> 8/4: Legionella urinary antigen>    Antimicrobials:  Vancomycin 8/4> Cefepime 8/4> Metronidazole 8/4>    Significant Hospital Events: Including procedures, antibiotic  start and stop dates in addition to other pertinent events   8/6: Admitted to ICU w/sepsis due to suspected intraabdominal source and possible aspiration pneumonia 8/7: Remains on pressors, being weaned off slowly  Interim History / Subjective:  Appears withdrawn, laying in fetal position in bed.  Not interacting.  Objective   Blood pressure 101/60, pulse 79, temperature 99.6 F (37.6 C), temperature source Axillary, resp. rate 20, height '5\' 2"'$  (1.575 m), weight 51 kg, SpO2 92 %.        Intake/Output Summary (Last 24 hours) at 08/22/2021 1704 Last data filed at 08/22/2021 1700 Gross per 24 hour  Intake 3754.58 ml  Output 4080 ml  Net -325.42 ml   Filed Weights   08/20/21 1708 08/21/21 0125 08/22/21 0500  Weight: 50.2 kg 50.2 kg 51 kg    Examination: GENERAL: 71 year-old  female critically ill patient lying in the bed in fetal position, with no acute distress.  EYES: Pupils equal, round, reactive to light. No scleral icterus. Extraocular muscles intact.  HEENT: Head atraumatic, normocephalic. Oropharynx and nasopharynx clear.  NECK:  Supple, no jugular venous distention. No thyroid enlargement, no tenderness.  LUNGS: Few rhonchi bilaterally, no wheezing, no rales or crepitation. No use of accessory muscles of respiration.  CARDIOVASCULAR: S1, S2 normal. No murmurs, rubs, or gallops.  ABDOMEN: Soft, tender to palpation , nondistended. Bowel sounds present. No organomegaly or mass. Right colectomy EXTREMITIES: No pedal edema, cyanosis, or clubbing.  NEUROLOGIC: Cannot assess well as patient is not interactive, laying in fetal position in bed. SKIN: No obvious rash, lesion, or ulcer.   Resolved Hospital Problem list   NA  Assessment & Plan:  Sepsis with Septic Shock due to suspected Intraabdominal source and possible Aspiration Pneumonia Lactic:  2.3, Baseline PCT: 0.16, UA: -, CXR: -, CT: Abscess/infection, aspiration pneumonia/mucus plug?  Initial interventions/workup included: 3 L LR & Cefepime/ Vancomycin/ Metronidazole -Supplemental oxygen as needed, to maintain SpO2 > 90% -F/u cultures, trend lactic/ PCT -Monitor WBC/ fever curve -Continue IV antibiotics: cefepime & vancomycin & Flagyl pending cultures -IVF hydration as needed -Pressors for MAP goal >65 -Strict I/O's   Hx of Recurrent Diverticulitis w/perforation and abscess  s/p partial colectomy with anastomosis and loop ileostomy creation 07/31/21 CT/Abd/pelvis showsright pelvis fluid collections concerning for abscess/infection -Surgery gives go ahead to p.o. intake -IVFs hydration -IV abx as above -Discussed with general surgery Dr. Peyton Najjar, with recommend conservative management , small collections of fluid likely not responsible for septic picture, continue to monitor, continue antibiotics   Acute on Chronic Hypoxic Respiratory Failure due to suspected Aspiration Pneumonia vs mucus plug COPD without evidence of acute exacerbation Hx: Current everyday smoker -Supplemental O2 as needed to maintain O2 saturations 88 to 92% -High risk for intubation -Follow intermittent ABG and chest x-ray as needed -Scheduled and As needed bronchodilators -Encourage smoking cessation   Acute Kidney Injury likely ATN in the setting of hypotension -Monitor I&O's / urinary output -Follow BMP -Ensure adequate renal perfusion -Avoid nephrotoxic agents as able -Replace electrolytes as indicated    Best Practice (right click and "Reselect all SmartList Selections" daily)   Diet/type: clear liquids DVT prophylaxis: prophylactic heparin  GI prophylaxis: PPI Lines: yes and it is still needed Foley:  Yes, and it is still needed Code Status:  full code Last date of multidisciplinary goals of care discussion [TBA]  Labs   CBC: Recent Labs  Lab 08/20/21 1708 08/21/21 0413  08/21/21 2030 08/22/21 0318 08/22/21 1200  WBC 15.5* 10.6*  8.6 10.4  --   NEUTROABS 13.4*  --  6.8 8.4*  --   HGB 13.5 10.8* 9.4* 9.2* 8.9*  HCT 42.7 33.0* 28.6* 28.7* 26.8*  MCV 88.4 87.5 86.9 88.6  --   PLT 247 219 182 187  --     Basic Metabolic Panel: Recent Labs  Lab 08/20/21 1708 08/21/21 0413 08/21/21 1510 08/22/21 0318  NA 134* 139  --  134*  K 4.8 3.7 3.7 4.4  CL 100 107  --  100  CO2 24 25  --  27  GLUCOSE 109* 183*  --  171*  BUN 21 14  --  5*  CREATININE 1.23* 0.87  --  0.68  CALCIUM 9.4 8.7*  --  8.1*  MG  --  1.6* 2.5* 2.1  PHOS 3.4 3.2  --  3.0   GFR: Estimated Creatinine Clearance: 51 mL/min (by C-G formula based on SCr of 0.68 mg/dL). Recent Labs  Lab 08/20/21 1708 08/20/21 1709 08/20/21 2011 08/21/21 0413 08/21/21 1521 08/21/21 2030 08/22/21 0318  PROCALCITON 0.16  --   --   --   --   --   --   WBC 15.5*  --   --  10.6*  --  8.6 10.4  LATICACIDVEN  --  2.3* 1.3  --  1.1  --   --     Liver Function Tests: Recent Labs  Lab 08/20/21 1708  AST 44*  ALT 34  ALKPHOS 114  BILITOT 0.6  PROT 7.7  ALBUMIN 3.0*   No results for input(s): "LIPASE", "AMYLASE" in the last 168 hours. No results for input(s): "AMMONIA" in the last 168 hours.  ABG    Component Value Date/Time   HCO3 27.7 08/21/2021 1521   O2SAT 85.6 08/21/2021 1521     Coagulation Profile: Recent Labs  Lab 08/20/21 1708  INR 1.1    Cardiac Enzymes: No results for input(s): "CKTOTAL", "CKMB", "CKMBINDEX", "TROPONINI" in the last 168 hours.  HbA1C: No results found for: "HGBA1C"  CBG: Recent Labs  Lab 08/21/21 0122  GLUCAP 140*    Review of Systems:   Unable to obtain due to patient's mental status.  Allergies Allergies  Allergen Reactions   Amoxicillin-Pot Clavulanate Diarrhea and Rash    Other reaction(s): Unknown   Biaxin [Clarithromycin] Diarrhea    Other reaction(s): Unknown   Oxycodone Itching   Codeine Rash   Nitrofurantoin Rash     Home  Medications  Prior to Admission medications   Medication Sig Start Date End Date Taking? Authorizing Provider  acidophilus (RISAQUAD) CAPS capsule Take 1 capsule by mouth 3 (three) times daily for 14 days. 08/16/21 08/30/21 Yes Nicole Kindred A, DO  alum & mag hydroxide-simeth (MAALOX/MYLANTA) 200-200-20 MG/5ML suspension Take 15 mLs by mouth every 4 (four) hours as needed for indigestion or heartburn. 08/16/21  Yes Ezekiel Slocumb, DO  Cholecalciferol 25 MCG (1000 UT) capsule Take 1,000 Units by mouth daily. 04/27/21  Yes [provider]  guaiFENesin (MUCINEX) 600 MG 12 hr tablet Take 1 tablet (600 mg total) by mouth 2 (two) times daily. 08/16/21  Yes Nicole Kindred A, DO  guaiFENesin-dextromethorphan (ROBITUSSIN DM) 100-10 MG/5ML syrup Take 10 mLs by mouth every 4 (four) hours as needed for cough. 08/16/21  Yes Ezekiel Slocumb, DO  HYDROcodone-acetaminophen (NORCO) 7.5-325 MG tablet Take 1 tablet by mouth every 4 (four) hours as needed for severe pain. 08/16/21  Yes Nicole Kindred A, DO  methocarbamol (ROBAXIN) 500 MG tablet Take 1  tablet (500 mg total) by mouth every 8 (eight) hours as needed for muscle spasms. 08/16/21  Yes Nicole Kindred A, DO  nicotine (NICODERM CQ - DOSED IN MG/24 HOURS) 21 mg/24hr patch Place 1 patch (21 mg total) onto the skin daily as needed (nicotine craving). 08/16/21  Yes Nicole Kindred A, DO  nystatin (MYCOSTATIN/NYSTOP) powder Apply 1 Application topically 2 (two) times daily as needed. 05/13/21  Yes [provider]  ondansetron (ZOFRAN) 4 MG tablet Take 1 tablet (4 mg total) by mouth every 6 (six) hours as needed for nausea. 08/16/21  Yes Nicole Kindred A, DO  traMADol (ULTRAM) 50 MG tablet Take 1 tablet (50 mg total) by mouth every 6 (six) hours as needed for moderate pain. 08/16/21  Yes Nicole Kindred A, DO  VENTOLIN HFA 108 (90 Base) MCG/ACT inhaler Inhale 2 puffs into the lungs 2 (two) times daily. 04/12/21  Yes [provider]    Scheduled Meds:   budesonide (PULMICORT) nebulizer solution  0.25 mg Nebulization BID   Chlorhexidine Gluconate Cloth  6 each Topical Q0600   heparin  5,000 Units Subcutaneous Q8H   ipratropium-albuterol  3 mL Nebulization TID   loperamide  4 mg Oral Q8H   pantoprazole (PROTONIX) IV  40 mg Intravenous Q24H   scopolamine  1 patch Transdermal Q72H   Continuous Infusions:  sodium chloride     dextrose 5% lactated ringers 125 mL/hr at 08/22/21 1700   norepinephrine (LEVOPHED) Adult infusion 4 mcg/min (08/22/21 1700)   piperacillin-tazobactam (ZOSYN)  IV Stopped (08/22/21 1516)   vasopressin 0.03 Units/min (08/22/21 1700)   PRN Meds:.acetaminophen, atropine, docusate sodium, HYDROcodone-acetaminophen, ipratropium-albuterol, morphine injection, ondansetron (ZOFRAN) IV, polyethylene glycol  Critical care time: 40 minutes    The patient is critically ill with multiple organ systems failure and requires high complexity decision making for assessment and support, frequent evaluation and titration of therapies, application of advanced monitoring technologies and extensive interpretation of multiple databases.    Patient was discussed in multidisciplinary rounds    C. Derrill Kay, MD Advanced Bronchoscopy PCCM [facility]    *This note was dictated using voice recognition software/Dragon.  Despite best efforts to proofread, errors can occur which can change the meaning. Any transcriptional errors that result from this process are unintentional and may not be fully corrected at the time of dictation.

## 2021-08-23 DIAGNOSIS — E872 Acidosis, unspecified: Secondary | ICD-10-CM | POA: Diagnosis not present

## 2021-08-23 DIAGNOSIS — Z515 Encounter for palliative care: Secondary | ICD-10-CM | POA: Diagnosis not present

## 2021-08-23 DIAGNOSIS — R6520 Severe sepsis without septic shock: Secondary | ICD-10-CM | POA: Diagnosis not present

## 2021-08-23 DIAGNOSIS — A419 Sepsis, unspecified organism: Secondary | ICD-10-CM | POA: Diagnosis not present

## 2021-08-23 DIAGNOSIS — Z7189 Other specified counseling: Secondary | ICD-10-CM | POA: Diagnosis not present

## 2021-08-23 LAB — MAGNESIUM
Magnesium: 1.2 mg/dL — ABNORMAL LOW (ref 1.7–2.4)
Magnesium: 2.4 mg/dL (ref 1.7–2.4)

## 2021-08-23 LAB — BASIC METABOLIC PANEL
Anion gap: 13 (ref 5–15)
Anion gap: 10 (ref 5–15)
BUN: <5 mg/dL — ABNORMAL LOW (ref 8–23)
BUN: <5 mg/dL — ABNORMAL LOW (ref 8–23)
CO2: 32 mmol/L (ref 22–32)
CO2: 30 mmol/L (ref 22–32)
Calcium: 7.7 mg/dL — ABNORMAL LOW (ref 8.9–10.3)
Calcium: 8.1 mg/dL — ABNORMAL LOW (ref 8.9–10.3)
Chloride: 78 mmol/L — ABNORMAL LOW (ref 98–111)
Chloride: 86 mmol/L — ABNORMAL LOW (ref 98–111)
Creatinine, Ser: 0.59 mg/dL (ref 0.44–1.00)
Creatinine, Ser: 0.68 mg/dL (ref 0.44–1.00)
GFR, Estimated: >60 mL/min (ref >60)
GFR, Estimated: >60 mL/min (ref >60)
Glucose, Bld: 120 mg/dL — ABNORMAL HIGH (ref 70–99)
Glucose, Bld: 143 mg/dL — ABNORMAL HIGH (ref 70–99)
Potassium: 2.8 mmol/L — ABNORMAL LOW (ref 3.5–5.1)
Potassium: 3.5 mmol/L (ref 3.5–5.1)
Sodium: 123 mmol/L — ABNORMAL LOW (ref 135–145)
Sodium: 126 mmol/L — ABNORMAL LOW (ref 135–145)

## 2021-08-23 LAB — PHOSPHORUS
Phosphorus: 2.4 mg/dL — ABNORMAL LOW (ref 2.5–4.6)
Phosphorus: 2.1 mg/dL — ABNORMAL LOW (ref 2.5–4.6)

## 2021-08-23 LAB — CBC
HCT: 26.1 % — ABNORMAL LOW (ref 36.0–46.0)
Hemoglobin: 9 g/dL — ABNORMAL LOW (ref 12.0–15.0)
MCH: 28.8 pg (ref 26.0–34.0)
MCHC: 34.5 g/dL (ref 30.0–36.0)
MCV: 83.4 fL (ref 80.0–100.0)
Platelets: 158 10*3/uL (ref 150–400)
RBC: 3.13 MIL/uL — ABNORMAL LOW (ref 3.87–5.11)
RDW: 14.9 % (ref 11.5–15.5)
WBC: 9 10*3/uL (ref 4.0–10.5)
nRBC: 0 % (ref 0.0–0.2)

## 2021-08-23 MED ORDER — MAGNESIUM SULFATE 4 GM/100ML IV SOLN
4.0000 g | Freq: Once | INTRAVENOUS | Status: AC
  Administered 2021-08-23: 4 g via INTRAVENOUS
  Filled 2021-08-23: qty 100

## 2021-08-23 MED ORDER — PHENOL 1.4 % MT LIQD
1.0000 | OROMUCOSAL | Status: DC | PRN
  Filled 2021-08-23 (×2): qty 177

## 2021-08-23 MED ORDER — HYDROCORTISONE SOD SUC (PF) 100 MG IJ SOLR
100.0000 mg | Freq: Two times a day (BID) | INTRAMUSCULAR | Status: DC
  Administered 2021-08-23 – 2021-08-25 (×6): 100 mg via INTRAVENOUS
  Filled 2021-08-23 (×8): qty 2

## 2021-08-23 MED ORDER — POTASSIUM CHLORIDE 10 MEQ/100ML IV SOLN
10.0000 meq | INTRAVENOUS | Status: AC
  Administered 2021-08-23 (×4): 10 meq via INTRAVENOUS
  Filled 2021-08-23 (×4): qty 100

## 2021-08-23 MED ORDER — LACTATED RINGERS IV BOLUS
250.0000 mL | Freq: Once | INTRAVENOUS | Status: AC
  Administered 2021-08-23: 250 mL via INTRAVENOUS

## 2021-08-23 MED ORDER — MIDODRINE HCL 5 MG PO TABS
10.0000 mg | ORAL_TABLET | Freq: Three times a day (TID) | ORAL | Status: DC
Start: 2021-08-23 — End: 2021-09-01
  Administered 2021-08-23 – 2021-09-01 (×28): 10 mg via ORAL
  Filled 2021-08-23 (×27): qty 2

## 2021-08-23 MED ORDER — CALCIUM GLUCONATE-NACL 1-0.675 GM/50ML-% IV SOLN
1.0000 g | Freq: Once | INTRAVENOUS | Status: AC
  Administered 2021-08-23: 1000 mg via INTRAVENOUS
  Filled 2021-08-23: qty 50

## 2021-08-23 NOTE — Consult Note (Signed)
PHARMACY CONSULT NOTE  Pharmacy Consult for Electrolyte Monitoring and Replacement   Recent Labs: Potassium (mmol/L)  Date Value  08/23/2021 2.8 (L)   Magnesium (mg/dL)  Date Value  08/23/2021 1.2 (L)   Calcium (mg/dL)  Date Value  08/23/2021 7.7 (L)   Albumin (g/dL)  Date Value  08/20/2021 3.0 (L)   Phosphorus (mg/dL)  Date Value  08/23/2021 2.4 (L)   Sodium (mmol/L)  Date Value  08/23/2021 123 (L)     Assessment: 71 y.o female with significant PMH of Chronic respiratory failure with hypoxia, COVID-19 virus infection, MDD (major depressive disorder), Hyperlipidemia, mixed, Tobacco abuse, Chronic obstructive pulmonary disease (COPD) ([place]), Right Breast, Cancer s/p mastectomy, CAD, PAD, Solitary Kidney, recurrent Diverticulitis w/perforation and abscess s/p partial colectomy with anastomosis and loop ileostomy creation 07/31/21 who presented to the ED with chief complaints of intermittent abdominal pain associated with n/v and generalized weakness.  On D5LR @ 125 ml/hr.   Goal of Therapy:  WNL  Plan:  Ordered KCl 10 mEq x 4 Recheck K+ ~ 1 hr after last run @ 1000 Ordered MgSO4 4 gm x 1 Ordered Calcium Gluconate 1 gm x 1 Est corrected Ca: 8.1 Will order K-Phos or Na-Phos after K+ recheck as appropriate. Will recheck labs tomorrow AM.  Renda Rolls, PharmD, [facility] 08/23/2021 5:14 AM

## 2021-08-23 NOTE — Progress Notes (Addendum)
Palliative: [name], is lying quietly in bed.  She greets me, making and mostly keeping eye contact.  She appears acutely/chronically ill and somewhat frail.  She is alert and oriented x 3, able to make her basic needs known.  There is no family present at bedside at this time.  We talk about her acute concerns and the treatment plan.  We talked about use of vasopressors.  I shared that she is continuing to receive IV antibiotics.  We talk about surgery consult.  Brianna Briggs tells me that she experienced lots of nausea this morning with dry heaves.  We talked about symptom management, the use of Zofran.  Brianna Briggs denies any questions from our discussion yesterday including those about CODE STATUS. Addendum: Brianna Briggs shares that she is having a flare of her eczema.  When asked what works for her she tells me that she has a cream at home she will ask her husband [name]/[name] to bring.  Orders adjusted to allow her to use from home eczema cream.  Conference with attending, bedside nursing staff, transition of care team related to patient condition, needs, goals of care, disposition.  Plan:   At this point continue full scope/full code.  Time for outcomes.  14 minutes  Quinn Axe, NP Palliative medicine team Team phone [phone] Greater than 50% of this time was spent counseling and coordinating care related to the above assessment and plan.

## 2021-08-23 NOTE — Progress Notes (Signed)
Patient ID: Brianna Briggs, female   DOB: 02/18/1950, 71 y.o.   MRN: [id number]     [place] Hospital Day(s): 2.   Interval History: Patient seen and examined, no acute events or new complaints overnight.  Patient found sleeping this morning.  She was in no distress.  Upon waking her up she endorses feeling the same.  She continued having neck pain, back pain and abdominal pain.  Denies any nausea or vomiting.  Tolerating small amount of diet.  Vital signs in last 24 hours: [min-max] current  Temp:  [97.8 F (36.6 C)-99.6 F (37.6 C)] 99 F (37.2 C) (08/08 0400) Pulse Rate:  [34-137] 49 (08/08 0630) Resp:  [13-26] 15 (08/08 0630) BP: (71-128)/(45-93) 84/57 (08/08 0630) SpO2:  [90 %-100 %] 97 % (08/08 0630)     Height: '5\' 2"'$  (157.5 cm) Weight: 51 kg BMI (Calculated): 20.56   Physical Exam:  Constitutional: alert, cooperative and no distress  Respiratory: breathing non-labored at rest  Cardiovascular: regular rate and sinus rhythm  Gastrointestinal: soft, non-tender, and non-distended.  Ileostomy pink and patent  Labs:     Latest Ref Rng & Units 08/23/2021    3:33 AM 08/22/2021    6:15 PM 08/22/2021   12:00 PM  CBC  WBC 4.0 - 10.5 K/uL 9.0     Hemoglobin 12.0 - 15.0 g/dL 9.0  8.9  8.9   Hematocrit 36.0 - 46.0 % 26.1  26.7  26.8   Platelets 150 - 400 K/uL 158         Latest Ref Rng & Units 08/23/2021    3:33 AM 08/22/2021    3:18 AM 08/21/2021    3:10 PM  CMP  Glucose 70 - 99 mg/dL 120  171    BUN 8 - 23 mg/dL <5  5    Creatinine 0.44 - 1.00 mg/dL 0.59  0.68    Sodium 135 - 145 mmol/L 123  134    Potassium 3.5 - 5.1 mmol/L 2.8  4.4  3.7   Chloride 98 - 111 mmol/L 78  100    CO2 22 - 32 mmol/L 32  27    Calcium 8.9 - 10.3 mg/dL 7.7  8.1      Imaging studies: No new pertinent imaging studies   Assessment/Plan:  71 y.o. female with sepsis   Possible etiologies are small intra-abdominal abscess versus pneumonia/COPD exacerbation  -Patient continue on  pressors.  Vasopressin is on hold.  Levophed at 5.6 mcg. -White blood cell continue within normal limits.   -Adequate renal function.   -Significant drop in sodium and potassium.  These are being replaced. -Continue eating small amount of diet.  This has been her baseline. -Stable hemoglobin at 9.0 -No sign of clinical deterioration -Agreed to continue IV antibiotic therapy -Continue regular diet -Encourage the patient to ambulate  Arnold Long, MD

## 2021-08-23 NOTE — Progress Notes (Signed)
NAME:  Brianna Briggs, MRN:  [id number], DOB:  October 20, 1950, LOS: 2 ADMISSION DATE:  08/20/2021, CONSULTATION DATE:  08/21/2021 REFERRING MD:  Hulan Saas, MD, CHIEF COMPLAINT:  Abdominal pain   History of Present Illness:  71 y.o female with significant PMH of Chronic respiratory failure with hypoxia, COVID-19 virus infection, MDD (major depressive disorder), Hyperlipidemia, mixed, Tobacco abuse, Chronic obstructive pulmonary disease (COPD) ([place]), Right Breast, Cancer s/p mastectomy, CAD, PAD, Solitary Kidney, recurrent Diverticulitis w/perforation and abscess s/p partial colectomy with anastomosis and loop ileostomy creation 07/31/21 who presented to the ED with chief complaints of intermittent abdominal pain associated with n/v and generalized weakness.   Patient was recently admitted to the hospital from 7/12-8/1 with epigastric pain. Her prior CT abd/pelvis showed persistent abscess attributed to acute sigmoid diverticulitis. She had initially been treated with percutaneous drainage and IV abx but failed and ultimately underwent sigmoid resection with anastomosis and loop ileostomy creation on 7/16. She was also found to have COVID infection and was treated  with remdesevir and steroids. She was discharged to SNF for short term rehab. Since discharge patient report decreased po intake, intermittent abdominal pain, chills, nausea and vomiting. Staff at the SNF noticed that her bp was very low so sent her to the ED for further evaluation.   ED Course: In the emergency department, the temperature was 37.1C, the heart rate136  beats/minute, the blood 83/60 pressure  mm Hg, the respiratory rate 26  breaths/minute, and the oxygen saturation 95% on 2L.   Pertinent Labs/Diagnostics Findings: Chemistry:Na+/ K+:134/4.8  Glucose: BUN/Cr.:21/1.23.  AST/ALT:44/34 CBC: WBC: 15.5 Other Lab findings:   PCT: 0.16.  Lactic acid: 2.3   Imaging:  CXR> CTA Chest> CT Abd/pelvis>see significant diagnostic tests  below   Patient given 30 cc/kg of fluids and started on broad-spectrum antibiotics Vanco cefepime and Flagyl for sepsis with septic shock due to suspected intraabdominal source. Due to abdominal finding on CT abd/pelvis concerning for abscess/infection, general surgery was consulted who recommended to continue with abx as above and will eval in the am. Patient remained hypotensive despite IVF boluses therefore was started on Levophed. PCCM consulted.  Pertinent  Medical History  Recurrent Diverticulitis with abscess s/p partial colectomy Acute on chronic respiratory failure with hypoxia ([place]) COVID-19 virus infection MDD (major depressive disorder) Hyperlipidemia, mixed Tobacco abuse Chronic obstructive pulmonary disease (COPD) ([place]) Right Breast Cancer s/p mastectomy CAD PAD Solitary Kidney  Significant Diagnostic Tests:  8/4: Chest Xray>No active disease 8/4: CTA abdomen and pelvis>1. Interval loop ileostomy in the right abdomen with small peristomal hernia. No bowel obstruction. 2. Two small right pelvic fluid collections, 2.6 and 2.3 cm largest dimension respectively. Sterility is indeterminate, although the presence of peripheral enhancement suggests abscess/infection. 3. Layering stones or sludge in the gallbladder without CT findings of acute cholecystitis. 8/4: CTA Chest>1. No pulmonary embolus. 2. Right middle lobe collapse with frothy debris in the right middle lobe bronchus, may be mucous plugging or seen in the setting of aspiration. 3. Moderate to advanced emphysema. 4. Scattered pulmonary nodules are stable from prior exam, largest measuring 6 mm in the right upper lobe. Recommend continued annual lung cancer screening CT. 5. Lipomatous hypertrophy of the intra-atrial septum.  Micro Data:  8/4: SARS-CoV-2 PCR> positive on 7/12 8/4: Blood culture x2> 8/4: Urine Culture> 8/4: MRSA PCR>>  8/4: Strep pneumo urinary antigen> 8/4: Legionella urinary antigen>    Antimicrobials:  Vancomycin 8/4> Cefepime 8/4> Metronidazole 8/4>    Significant Hospital Events: Including procedures, antibiotic  start and stop dates in addition to other pertinent events   8/6: Admitted to ICU w/sepsis due to suspected intraabdominal source and possible aspiration pneumonia 8/7: Remains on pressors, being weaned off slowly 8/8 remains on pressors  Interim History / Subjective:  Lethargic Remains on pressors Prognosis is guarded Remains critically ill  Objective   Blood pressure (!) 84/57, pulse (!) 49, temperature 99 F (37.2 C), temperature source Axillary, resp. rate 15, height '5\' 2"'$  (1.575 m), weight 51 kg, SpO2 97 %.        Intake/Output Summary (Last 24 hours) at 08/23/2021 0747 Last data filed at 08/23/2021 0640 Gross per 24 hour  Intake 5406.28 ml  Output 3705 ml  Net 1701.28 ml    Filed Weights   08/20/21 1708 08/21/21 0125 08/22/21 0500  Weight: 50.2 kg 50.2 kg 51 kg    REVIEW OF SYSTEMS  PATIENT IS UNABLE TO PROVIDE COMPLETE REVIEW OF SYSTEMS DUE TO SEVERE CRITICAL ILLNESS    PHYSICAL EXAMINATION:  GENERAL:critically ill appearing,  EYES: Pupils equal, round, reactive to light.  No scleral icterus.  MOUTH: Moist mucosal membrane.  NECK: Supple.  PULMONARY: +rhonchi, +wheezing CARDIOVASCULAR: S1 and S2.  No murmurs  GASTROINTESTINAL: Soft, nontender, -distended. Positive bowel sounds. RT colectomy MUSCULOSKELETAL: No swelling, clubbing, or edema.  NEUROLOGIC: lethargic ?baseline SKIN:intact,warm,dry    Resolved Hospital Problem list   NA  Assessment & Plan:  71 yo WF with Sepsis with Septic Shock due to suspected Intraabdominal source and possible Aspiration Pneumonia  SEPTIC shock SOURCE- Hx of Recurrent Diverticulitis w/perforation and abscess  s/p partial colectomy with anastomosis and loop ileostomy creation 07/31/21 CT/Abd/pelvis showsright pelvis fluid collections concerning for abscess/infection -use vasopressors to  keep MAP>65 as needed -follow ABG and LA as needed -follow up cultures -emperic ABX -consider stress dose steroids Follow up [place] -Discussed with general surgery Dr. Peyton Najjar, with recommend conservative management , small collections of fluid likely not responsible for septic picture, continue to monitor, continue antibiotics With continued use of pressors patient may need draining of  fluid collections    Acute on Chronic Hypoxic Respiratory Failure due to suspected Aspiration Pneumonia vs mucus plug COPD without evidence of acute exacerbation Hx: Current everyday smoker -Supplemental O2 as needed to maintain O2 saturations 88 to 92% -High risk for intubation -Follow intermittent ABG and chest x-ray as needed   ACUTE KIDNEY INJURY/Renal Failure -continue Foley Catheter-assess need -Avoid nephrotoxic agents -Follow urine output, BMP -Ensure adequate renal perfusion, optimize oxygenation -Renal dose medications   Intake/Output Summary (Last 24 hours) at 08/23/2021 0752 Last data filed at 08/23/2021 5277 Gross per 24 hour  Intake 5406.28 ml  Output 3705 ml  Net 1701.28 ml    ENDO - ICU hypoglycemic\Hyperglycemia protocol -check FSBS per protocol   GI GI PROPHYLAXIS as indicated  NUTRITIONAL STATUS DIET-->as tolerated Constipation protocol as indicated   ELECTROLYTES -follow labs as needed -replace as needed -pharmacy consultation and following   Best Practice (right click and "Reselect all SmartList Selections" daily)   Diet/type: clear liquids DVT prophylaxis: prophylactic heparin  GI prophylaxis: PPI Lines: yes and it is still needed Foley:  Yes, and it is still needed Code Status:  full code Last date of multidisciplinary goals of care discussion [TBA]  Labs   CBC: Recent Labs  Lab 08/20/21 1708 08/21/21 0413 08/21/21 2030 08/22/21 0318 08/22/21 1200 08/22/21 1815 08/23/21 0333  WBC 15.5* 10.6* 8.6 10.4  --   --  9.0  NEUTROABS 13.4*   --  6.8 8.4*  --   --   --   HGB 13.5 10.8* 9.4* 9.2* 8.9* 8.9* 9.0*  HCT 42.7 33.0* 28.6* 28.7* 26.8* 26.7* 26.1*  MCV 88.4 87.5 86.9 88.6  --   --  83.4  PLT 247 219 182 187  --   --  158     Basic Metabolic Panel: Recent Labs  Lab 08/20/21 1708 08/21/21 0413 08/21/21 1510 08/22/21 0318 08/23/21 0333  NA 134* 139  --  134* 123*  K 4.8 3.7 3.7 4.4 2.8*  CL 100 107  --  100 78*  CO2 24 25  --  27 32  GLUCOSE 109* 183*  --  171* 120*  BUN 21 14  --  5* <5*  CREATININE 1.23* 0.87  --  0.68 0.59  CALCIUM 9.4 8.7*  --  8.1* 7.7*  MG  --  1.6* 2.5* 2.1 1.2*  PHOS 3.4 3.2  --  3.0 2.4*    GFR: Estimated Creatinine Clearance: 51 mL/min (by C-G formula based on SCr of 0.59 mg/dL). Recent Labs  Lab 08/20/21 1708 08/20/21 1709 08/20/21 2011 08/21/21 0413 08/21/21 1521 08/21/21 2030 08/22/21 0318 08/23/21 0333  PROCALCITON 0.16  --   --   --   --   --   --   --   WBC 15.5*  --   --  10.6*  --  8.6 10.4 9.0  LATICACIDVEN  --  2.3* 1.3  --  1.1  --   --   --      Liver Function Tests: Recent Labs  Lab 08/20/21 1708  AST 44*  ALT 34  ALKPHOS 114  BILITOT 0.6  PROT 7.7  ALBUMIN 3.0*    ABG    Component Value Date/Time   HCO3 27.7 08/21/2021 1521   O2SAT 85.6 08/21/2021 1521     CBG: Recent Labs  Lab 08/21/21 0122  GLUCAP 140*     Allergies Allergies  Allergen Reactions   Amoxicillin-Pot Clavulanate Diarrhea and Rash    Other reaction(s): Unknown   Biaxin [Clarithromycin] Diarrhea    Other reaction(s): Unknown   Oxycodone Itching   Codeine Rash   Nitrofurantoin Rash     Home Medications  Prior to Admission medications   Medication Sig Start Date End Date Taking? Authorizing Provider  acidophilus (RISAQUAD) CAPS capsule Take 1 capsule by mouth 3 (three) times daily for 14 days. 08/16/21 08/30/21 Yes Nicole Kindred A, DO  alum & mag hydroxide-simeth (MAALOX/MYLANTA) 200-200-20 MG/5ML suspension Take 15 mLs by mouth every 4 (four) hours as needed  for indigestion or heartburn. 08/16/21  Yes Ezekiel Slocumb, DO  Cholecalciferol 25 MCG (1000 UT) capsule Take 1,000 Units by mouth daily. 04/27/21  Yes [provider]  guaiFENesin (MUCINEX) 600 MG 12 hr tablet Take 1 tablet (600 mg total) by mouth 2 (two) times daily. 08/16/21  Yes Nicole Kindred A, DO  guaiFENesin-dextromethorphan (ROBITUSSIN DM) 100-10 MG/5ML syrup Take 10 mLs by mouth every 4 (four) hours as needed for cough. 08/16/21  Yes Ezekiel Slocumb, DO  HYDROcodone-acetaminophen (NORCO) 7.5-325 MG tablet Take 1 tablet by mouth every 4 (four) hours as needed for severe pain. 08/16/21  Yes Nicole Kindred A, DO  methocarbamol (ROBAXIN) 500 MG tablet Take 1 tablet (500 mg total) by mouth every 8 (eight) hours as needed for muscle spasms. 08/16/21  Yes Nicole Kindred A, DO  nicotine (NICODERM CQ - DOSED IN MG/24 HOURS) 21 mg/24hr patch Place 1 patch (21 mg total)  onto the skin daily as needed (nicotine craving). 08/16/21  Yes Nicole Kindred A, DO  nystatin (MYCOSTATIN/NYSTOP) powder Apply 1 Application topically 2 (two) times daily as needed. 05/13/21  Yes [provider]  ondansetron (ZOFRAN) 4 MG tablet Take 1 tablet (4 mg total) by mouth every 6 (six) hours as needed for nausea. 08/16/21  Yes Nicole Kindred A, DO  traMADol (ULTRAM) 50 MG tablet Take 1 tablet (50 mg total) by mouth every 6 (six) hours as needed for moderate pain. 08/16/21  Yes Nicole Kindred A, DO  VENTOLIN HFA 108 (90 Base) MCG/ACT inhaler Inhale 2 puffs into the lungs 2 (two) times daily. 04/12/21  Yes [provider]    Scheduled Meds:  budesonide (PULMICORT) nebulizer solution  0.25 mg Nebulization BID   Chlorhexidine Gluconate Cloth  6 each Topical Q0600   heparin  5,000 Units Subcutaneous Q8H   ipratropium-albuterol  3 mL Nebulization TID   loperamide  4 mg Oral Q8H   pantoprazole (PROTONIX) IV  40 mg Intravenous Q24H   scopolamine  1 patch Transdermal Q72H   Continuous Infusions:  sodium  chloride     calcium gluconate     dextrose 5% lactated ringers Stopped (08/23/21 0503)   norepinephrine (LEVOPHED) Adult infusion 6 mcg/min (08/23/21 0640)   piperacillin-tazobactam (ZOSYN)  IV Stopped (08/23/21 0517)   potassium chloride 10 mEq (08/23/21 0745)   vasopressin Stopped (08/23/21 0220)   PRN Meds:.acetaminophen, atropine, docusate sodium, HYDROcodone-acetaminophen, ipratropium-albuterol, morphine injection, ondansetron (ZOFRAN) IV, polyethylene glycol    DVT/GI PRX  assessed I Assessed the need for Labs I Assessed the need for Foley I Assessed the need for Central Venous Line Family Discussion when available I Assessed the need for Mobilization I made an Assessment of medications to be adjusted accordingly Safety Risk assessment completed  CASE DISCUSSED IN MULTIDISCIPLINARY ROUNDS WITH ICU TEAM     Critical Care Time devoted to patient care services described in this note is 45 minutes.  Critical care was necessary to treat /prevent imminent and life-threatening deterioration. Overall, patient is critically ill, prognosis is guarded.    Corrin Parker, M.D.  [facility]  Medical Director [place] Director [facility] Cardio-Pulmonary Department

## 2021-08-24 ENCOUNTER — Inpatient Hospital Stay: Payer: Medicare Other

## 2021-08-24 DIAGNOSIS — K651 Peritoneal abscess: Secondary | ICD-10-CM | POA: Diagnosis not present

## 2021-08-24 DIAGNOSIS — E872 Acidosis, unspecified: Secondary | ICD-10-CM | POA: Diagnosis not present

## 2021-08-24 DIAGNOSIS — A419 Sepsis, unspecified organism: Secondary | ICD-10-CM | POA: Diagnosis not present

## 2021-08-24 DIAGNOSIS — Z7189 Other specified counseling: Secondary | ICD-10-CM | POA: Diagnosis not present

## 2021-08-24 DIAGNOSIS — R6520 Severe sepsis without septic shock: Secondary | ICD-10-CM | POA: Diagnosis not present

## 2021-08-24 DIAGNOSIS — Z515 Encounter for palliative care: Secondary | ICD-10-CM | POA: Diagnosis not present

## 2021-08-24 LAB — CBC
HCT: 34.4 % — ABNORMAL LOW (ref 36.0–46.0)
Hemoglobin: 11.5 g/dL — ABNORMAL LOW (ref 12.0–15.0)
MCH: 28 pg (ref 26.0–34.0)
MCHC: 33.4 g/dL (ref 30.0–36.0)
MCV: 83.9 fL (ref 80.0–100.0)
Platelets: 236 10*3/uL (ref 150–400)
RBC: 4.1 MIL/uL (ref 3.87–5.11)
RDW: 15.2 % (ref 11.5–15.5)
WBC: 7.6 10*3/uL (ref 4.0–10.5)
nRBC: 0 % (ref 0.0–0.2)

## 2021-08-24 LAB — COMPREHENSIVE METABOLIC PANEL
ALT: 14 U/L (ref 0–44)
AST: 21 U/L (ref 15–41)
Albumin: 2.7 g/dL — ABNORMAL LOW (ref 3.5–5.0)
Alkaline Phosphatase: 81 U/L (ref 38–126)
Anion gap: 11 (ref 5–15)
BUN: 6 mg/dL — ABNORMAL LOW (ref 8–23)
CO2: 32 mmol/L (ref 22–32)
Calcium: 8.3 mg/dL — ABNORMAL LOW (ref 8.9–10.3)
Chloride: 96 mmol/L — ABNORMAL LOW (ref 98–111)
Creatinine, Ser: 0.85 mg/dL (ref 0.44–1.00)
GFR, Estimated: >60 mL/min (ref >60)
Glucose, Bld: 142 mg/dL — ABNORMAL HIGH (ref 70–99)
Potassium: 3.2 mmol/L — ABNORMAL LOW (ref 3.5–5.1)
Sodium: 139 mmol/L (ref 135–145)
Total Bilirubin: 0.7 mg/dL (ref 0.3–1.2)
Total Protein: 6.3 g/dL — ABNORMAL LOW (ref 6.5–8.1)

## 2021-08-24 LAB — POTASSIUM: Potassium: 4 mmol/L (ref 3.5–5.1)

## 2021-08-24 LAB — PHOSPHORUS: Phosphorus: 3.1 mg/dL (ref 2.5–4.6)

## 2021-08-24 LAB — MAGNESIUM: Magnesium: 2.2 mg/dL (ref 1.7–2.4)

## 2021-08-24 MED ORDER — FENTANYL CITRATE (PF) 100 MCG/2ML IJ SOLN
INTRAMUSCULAR | Status: AC
  Filled 2021-08-24: qty 2

## 2021-08-24 MED ORDER — POTASSIUM CHLORIDE 10 MEQ/100ML IV SOLN
10.0000 meq | INTRAVENOUS | Status: AC
  Administered 2021-08-24 (×4): 10 meq via INTRAVENOUS
  Filled 2021-08-24 (×4): qty 100

## 2021-08-24 MED ORDER — FENTANYL CITRATE (PF) 100 MCG/2ML IJ SOLN
INTRAMUSCULAR | Status: AC | PRN
  Administered 2021-08-24: 25 ug via INTRAVENOUS

## 2021-08-24 MED ORDER — MELATONIN 5 MG PO TABS
5.0000 mg | ORAL_TABLET | Freq: Every day | ORAL | Status: DC
  Administered 2021-08-25 – 2021-08-31 (×7): 5 mg via ORAL
  Filled 2021-08-24 (×7): qty 1

## 2021-08-24 MED ORDER — LOPERAMIDE HCL 2 MG PO CAPS: 2.0000 mg | ORAL_CAPSULE | ORAL | Status: DC | PRN

## 2021-08-24 MED ORDER — LACTATED RINGERS IV BOLUS
1000.0000 mL | Freq: Once | INTRAVENOUS | Status: AC
  Administered 2021-08-24: 1000 mL via INTRAVENOUS

## 2021-08-24 MED ORDER — ENOXAPARIN SODIUM 40 MG/0.4ML IJ SOSY
40.0000 mg | PREFILLED_SYRINGE | INTRAMUSCULAR | Status: DC
  Administered 2021-08-24 – 2021-08-31 (×8): 40 mg via SUBCUTANEOUS
  Filled 2021-08-24 (×8): qty 0.4

## 2021-08-24 MED ORDER — NICOTINE 21 MG/24HR TD PT24
21.0000 mg | MEDICATED_PATCH | Freq: Every day | TRANSDERMAL | Status: DC
  Filled 2021-08-24: qty 1

## 2021-08-24 MED ORDER — MIDAZOLAM HCL 2 MG/2ML IJ SOLN
INTRAMUSCULAR | Status: AC
  Filled 2021-08-24: qty 2

## 2021-08-24 MED ORDER — LORAZEPAM 2 MG/ML IJ SOLN
1.0000 mg | Freq: Once | INTRAMUSCULAR | Status: AC
  Administered 2021-08-24: 1 mg via INTRAVENOUS
  Filled 2021-08-24: qty 1

## 2021-08-24 MED ORDER — PANTOPRAZOLE SODIUM 40 MG PO TBEC
40.0000 mg | DELAYED_RELEASE_TABLET | Freq: Every day | ORAL | Status: DC
  Administered 2021-08-24 – 2021-08-26 (×3): 40 mg via ORAL
  Filled 2021-08-24 (×3): qty 1

## 2021-08-24 MED ORDER — NICOTINE 21 MG/24HR TD PT24: 21.0000 mg | MEDICATED_PATCH | Freq: Every day | TRANSDERMAL | Status: DC

## 2021-08-24 MED ORDER — MIDAZOLAM HCL 2 MG/2ML IJ SOLN
INTRAMUSCULAR | Status: AC | PRN
  Administered 2021-08-24: .5 mg via INTRAVENOUS

## 2021-08-24 NOTE — Progress Notes (Signed)
   08/24/21 2213  Charting Type  Charting Type Full Reassessment Changes Noted  Focused Reassessment Changes Noted Psychosocial  Intensive Care Delirium Screening Checklist (ICDSC)  Altered level of consciousness 0  Inattention 1  Disorientation 0  Hallucinations, Delusion, or Psychosis 0  Psychomotor agitation or retardation 1  Inappropriate speech or mood 1  Sleep/wake cycle disturbance 1  Symptom fluctuation 1  ICDSC Score 5  ICDSC - Delirium Prevention:  Universal Requirements (Complete for ICU patients)  Universal Precautions Initiated *See Row Information* Yes  ICDSC - Interventions for patient with ICDSC equal to or greater than 4 (Complete for ICU patients)  ICDSC Positive:  Interventions Continue Universal (preventative) measures;Utilize bed alarms;Relocate patient close to nurses' station;Hide tubes (stockinette over IV line), consider removing tubes and drains if at all possible  Colostomy RLQ  Placement Date/Time: 07/31/21 1600   Location: RLQ  Output (mL) 50 mL  Psychosocial  Psychosocial (WDL) X  Patient Behaviors Anxious;Agitated;Irritable;Restless;Paranoid;Tearful;Withdrawn  Needs Expressed Physical  Emotional support given Given to patient     Patient called out requesting assistance to go outside and smoke a cigarette. Critical Care NP messaged regarding acute episode. See new orders.   On assessment patient appeared agitated, tearful and withdrawn. She had poor impulse control and safety awareness regarding the central lines and foley catheter.     Catheter removed per order/protocol.   Patient was reoriented, and assisted safely into the bed.   Safety was ensured.    Will continue to monitor.

## 2021-08-24 NOTE — Progress Notes (Signed)
Patient ID: Brianna Briggs, female   DOB: [birth year], 71 y.o.   MRN: [id number]     [facility] Hospital Day(s): 3.   Interval History: Patient seen and examined, no acute events or new complaints overnight. Patient reports feeling better.  She denies nausea this morning.  Continue with chronic neck, back and abdominal pain.  Endorses some itchiness in her elbows, knees.  This morning patient with blood pressure of 129/87.  Levophed decreased to 5 mcg.  She tolerated diet yesterday.  Continue with adequate ileostomy output.  Vital signs in last 24 hours: [min-max] current  Temp:  [98.3 F (36.8 C)-99 F (37.2 C)] 98.4 F (36.9 C) (08/09 0900) Pulse Rate:  [54-116] 62 (08/09 0900) Resp:  [11-32] 16 (08/09 0900) BP: (76-146)/(49-120) 95/55 (08/09 0900) SpO2:  [89 %-100 %] 98 % (08/09 0900) Weight:  [49.5 kg] 49.5 kg (08/09 0626)     Height: '5\' 2"'$  (157.5 cm) Weight: 49.5 kg BMI (Calculated): 19.95   Physical Exam:  Constitutional: alert, cooperative and no distress  Respiratory: breathing non-labored at rest  Cardiovascular: regular rate and sinus rhythm  Gastrointestinal: soft, non-tender, and non-distended.  Ileostomy pink and patent  Labs:     Latest Ref Rng & Units 08/24/2021    2:16 AM 08/23/2021    3:33 AM 08/22/2021    6:15 PM  CBC  WBC 4.0 - 10.5 K/uL 7.6  9.0    Hemoglobin 12.0 - 15.0 g/dL 11.5  9.0  8.9   Hematocrit 36.0 - 46.0 % 34.4  26.1  26.7   Platelets 150 - 400 K/uL 236  158        Latest Ref Rng & Units 08/24/2021    2:16 AM 08/23/2021   10:55 AM 08/23/2021    3:33 AM  CMP  Glucose 70 - 99 mg/dL 142  143  120   BUN 8 - 23 mg/dL 6  <5  <5   Creatinine 0.44 - 1.00 mg/dL 0.85  0.68  0.59   Sodium 135 - 145 mmol/L 139  126  123   Potassium 3.5 - 5.1 mmol/L 3.2  3.5  2.8   Chloride 98 - 111 mmol/L 96  86  78   CO2 22 - 32 mmol/L 32  30  32   Calcium 8.9 - 10.3 mg/dL 8.3  8.1  7.7   Total Protein 6.5 - 8.1 g/dL 6.3     Total Bilirubin 0.3 - 1.2 mg/dL  0.7     Alkaline Phos 38 - 126 U/L 81     AST 15 - 41 U/L 21     ALT 0 - 44 U/L 14       Imaging studies: No new pertinent imaging studies   Assessment/Plan:  71 y.o. female with sepsis   Possible etiologies are small intra-abdominal abscess versus pneumonia/COPD exacerbation  -Since Levophed has not been able to be weaned, I discussed case with IR yesterday and plan is to aspirate 2 cm pelvic fluid collection for diagnostic and therapeutic purposes.  Culture will be taken.  This can help guide antibiotic therapy -Otherwise patient was tolerating diet and shoulder restart diet after procedure. -White blood cell continue within normal limits, no fever -Continue with current medical management of sepsis with antibiotic therapy. -I encouraged patient to get out of bed and ambulate.  Arnold Long, MD

## 2021-08-24 NOTE — Progress Notes (Signed)
PHARMACIST - PHYSICIAN COMMUNICATION  CONCERNING: IV to Oral Route Change Policy  RECOMMENDATION: This patient is receiving pantoprazole by the intravenous route.  Based on criteria approved by the Pharmacy and Therapeutics Committee, the intravenous medication(s) is/are being converted to the equivalent oral dose form(s).   DESCRIPTION: These criteria include: The patient is eating (either orally or via tube) and/or has been taking other orally administered medications for a least 24 hours The patient has no evidence of active gastrointestinal bleeding or impaired GI absorption (gastrectomy, short bowel, patient on TNA or NPO).  If you have questions about this conversion, please contact the [place], [facility] 08/24/2021 8:37 AM

## 2021-08-24 NOTE — Consult Note (Addendum)
PHARMACY CONSULT NOTE  Pharmacy Consult for Electrolyte Monitoring and Replacement   Recent Labs: Potassium (mmol/L)  Date Value  08/24/2021 3.2 (L)   Magnesium (mg/dL)  Date Value  08/24/2021 2.2   Calcium (mg/dL)  Date Value  08/24/2021 8.3 (L)   Albumin (g/dL)  Date Value  08/24/2021 2.7 (L)   Phosphorus (mg/dL)  Date Value  08/24/2021 3.1   Sodium (mmol/L)  Date Value  08/24/2021 139   Assessment: 71 y.o female with significant PMH of chronic respiratory failure with hypoxia, MDD, HLD, tobacco abuse, COPD, right breast cancer s/p mastectomy, CAD, PAD, solitary kidney, recurrent diverticulitis w/ perforation and abscess s/p partial colectomy with anastomosis and loop ileostomy creation 07/31/21 who presented to the ED with chief complaints of intermittent abdominal pain associated with n/v and generalized weakness.  MIVF: D5LR at 125 cc/hr   Goal of Therapy:  WNL  Plan:  --K 3.2, Kcl 10 mEq x 4 per PCCM --Follow-up electrolytes with AM labs tomorrow  Benita Gutter  08/24/2021 8:28 AM

## 2021-08-24 NOTE — Procedures (Signed)
Interventional Radiology Procedure Note  Procedure: CT guided aspiration of right pelvic abscess  Indication: Right pelvic abscess  Findings: Please refer to procedural dictation for full description.  Complications: None  EBL: < 10 mL  Miachel Roux, MD [phone]

## 2021-08-24 NOTE — Progress Notes (Addendum)
NAME:  Brianna Briggs, MRN:  [id number], DOB:  11/14/1950, LOS: 3 ADMISSION DATE:  08/20/2021, CONSULTATION DATE:  08/21/2021 REFERRING MD:  Hulan Saas, MD, CHIEF COMPLAINT:  Abdominal pain   History of Present Illness:  71 y.o female with significant PMH of Chronic respiratory failure with hypoxia, COVID-19 virus infection, MDD (major depressive disorder), Hyperlipidemia, mixed, Tobacco abuse, Chronic obstructive pulmonary disease (COPD) ([place]), Right Breast, Cancer s/p mastectomy, CAD, PAD, Solitary Kidney, recurrent Diverticulitis w/perforation and abscess s/p partial colectomy with anastomosis and loop ileostomy creation 07/31/21 who presented to the ED with chief complaints of intermittent abdominal pain associated with n/v and generalized weakness.   Patient was recently admitted to the hospital from 7/12-8/1 with epigastric pain. Her prior CT abd/pelvis showed persistent abscess attributed to acute sigmoid diverticulitis. She had initially been treated with percutaneous drainage and IV abx but failed and ultimately underwent sigmoid resection with anastomosis and loop ileostomy creation on 7/16. She was also found to have COVID infection and was treated  with remdesevir and steroids. She was discharged to SNF for short term rehab. Since discharge patient report decreased po intake, intermittent abdominal pain, chills, nausea and vomiting. Staff at the SNF noticed that her bp was very low so sent her to the ED for further evaluation.   ED Course: In the emergency department, the temperature was 37.1C, the heart rate136  beats/minute, the blood 83/60 pressure  mm Hg, the respiratory rate 26  breaths/minute, and the oxygen saturation 95% on 2L.   Pertinent Labs/Diagnostics Findings: Chemistry:Na+/ K+:134/4.8  Glucose: BUN/Cr.:21/1.23.  AST/ALT:44/34 CBC: WBC: 15.5 Other Lab findings:   PCT: 0.16.  Lactic acid: 2.3   Imaging:  CXR> CTA Chest> CT Abd/pelvis>see significant diagnostic tests  below   Patient given 30 cc/kg of fluids and started on broad-spectrum antibiotics Vanco cefepime and Flagyl for sepsis with septic shock due to suspected intraabdominal source. Due to abdominal finding on CT abd/pelvis concerning for abscess/infection, general surgery was consulted who recommended to continue with abx as above and will eval in the am. Patient remained hypotensive despite IVF boluses therefore was started on Levophed. PCCM consulted.  Pertinent  Medical History  Recurrent Diverticulitis with abscess s/p partial colectomy Acute on chronic respiratory failure with hypoxia ([place]) COVID-19 virus infection MDD (major depressive disorder) Hyperlipidemia, mixed Tobacco abuse Chronic obstructive pulmonary disease (COPD) ([place]) Right Breast Cancer s/p mastectomy CAD PAD Solitary Kidney  Significant Diagnostic Tests:  8/4: Chest Xray>No active disease 8/4: CTA abdomen and pelvis>1. Interval loop ileostomy in the right abdomen with small peristomal hernia. No bowel obstruction. 2. Two small right pelvic fluid collections, 2.6 and 2.3 cm largest dimension respectively. Sterility is indeterminate, although the presence of peripheral enhancement suggests abscess/infection. 3. Layering stones or sludge in the gallbladder without CT findings of acute cholecystitis. 8/4: CTA Chest>1. No pulmonary embolus. 2. Right middle lobe collapse with frothy debris in the right middle lobe bronchus, may be mucous plugging or seen in the setting of aspiration. 3. Moderate to advanced emphysema. 4. Scattered pulmonary nodules are stable from prior exam, largest measuring 6 mm in the right upper lobe. Recommend continued annual lung cancer screening CT. 5. Lipomatous hypertrophy of the intra-atrial septum.  Micro Data:  8/4: SARS-CoV-2 PCR> positive on 7/12 8/4: Blood culture x2> 8/4: Urine Culture> 8/4: MRSA PCR>>  8/4: Strep pneumo urinary antigen> 8/4: Legionella urinary antigen>    Antimicrobials:  Vancomycin 8/4> Cefepime 8/4> Metronidazole 8/4>    Significant Hospital Events: Including procedures, antibiotic  start and stop dates in addition to other pertinent events   8/6: Admitted to ICU w/sepsis due to suspected intraabdominal source and possible aspiration pneumonia 8/7: Remains on pressors, being weaned off slowly 8/8 remains on pressors 8/9 remains on pressors, s/p fluid drainage  Interim History / Subjective:  Alert and awake Follows commands Pain resolving Very labile BP's BP remains low, remains on pressors  Objective   Blood pressure 134/83, pulse 73, temperature 98.4 F (36.9 C), resp. rate (!) 24, height '5\' 2"'$  (1.575 m), weight 49.5 kg, SpO2 95 %.        Intake/Output Summary (Last 24 hours) at 08/24/2021 1639 Last data filed at 08/24/2021 0645 Gross per 24 hour  Intake 550.69 ml  Output 1875 ml  Net -1324.31 ml    Filed Weights   08/21/21 0125 08/22/21 0500 08/24/21 0626  Weight: 50.2 kg 51 kg 49.5 kg       Review of Systems: Gen:  Denies  fever, sweats, chills weight loss  HEENT: Denies blurred vision, double vision, ear pain, eye pain, hearing loss, nose bleeds, sore throat Cardiac:  No dizziness, chest pain or heaviness, chest tightness,edema, No JVD Resp:   No cough, -sputum production, -shortness of breath,-wheezing, -hemoptysis,  Other:  All other systems negative   Physical Examination:   General Appearance: No distress  EYES PERRLA, EOM intact.   NECK Supple, No JVD Pulmonary: normal breath sounds, No wheezing.  CardiovascularNormal S1,S2.  No m/r/g.   Abdomen: Benign, Soft, non-tender. RT colostomy ALL OTHER ROS ARE NEGATIVE     Resolved Hospital Problem list   NA  Assessment & Plan:  71 yo WF with Sepsis with Septic Shock due to Intraabdominal source and possible Aspiration Pneumonia  SEPTIC shock-slowly resolving Hx of Recurrent Diverticulitis w/perforation and abscess  s/p partial colectomy with  anastomosis and loop ileostomy creation 07/31/21 CT/Abd/pelvis showsright pelvis fluid collections concerning for abscess/infection -use vasopressors to keep MAP>65 as needed -follow ABG and LA as needed -follow up cultures -emperic ABX stress dose steroids Midodrine 10 TID Follow up GEN SURGERY RECS    Acute on Chronic Hypoxic Respiratory Failure due to suspected Aspiration Pneumonia vs mucus plug COPD without evidence of acute exacerbation Wean off oxygen  Hx: Current everyday smoker -Supplemental O2 as needed to maintain O2 saturations 88 to 92%  ACUTE KIDNEY INJURY/Renal Failure -continue Foley Catheter-assess need -Avoid nephrotoxic agents -Follow urine output, BMP -Ensure adequate renal perfusion, optimize oxygenation -Renal dose medications   Intake/Output Summary (Last 24 hours) at 08/24/2021 1642 Last data filed at 08/24/2021 0645 Gross per 24 hour  Intake 550.69 ml  Output 1875 ml  Net -1324.31 ml     ENDO - ICU hypoglycemic\Hyperglycemia protocol -check FSBS per protocol   GI GI PROPHYLAXIS as indicated  NUTRITIONAL STATUS DIET--> as tolerated Constipation protocol as indicated   ELECTROLYTES -follow labs as needed -replace as needed -pharmacy consultation and following  Best Practice (right click and "Reselect all SmartList Selections" daily)   Diet/type: clear liquids DVT prophylaxis: prophylactic heparin  GI prophylaxis: PPI Lines: yes and it is still needed Foley:  Yes, and it is still needed Code Status:  full code Last date of multidisciplinary goals of care discussion [TBA]  Labs   CBC: Recent Labs  Lab 08/20/21 1708 08/21/21 0413 08/21/21 2030 08/22/21 0318 08/22/21 1200 08/22/21 1815 08/23/21 0333 08/24/21 0216  WBC 15.5* 10.6* 8.6 10.4  --   --  9.0 7.6  NEUTROABS 13.4*  --  6.8 8.4*  --   --   --   --  HGB 13.5 10.8* 9.4* 9.2* 8.9* 8.9* 9.0* 11.5*  HCT 42.7 33.0* 28.6* 28.7* 26.8* 26.7* 26.1* 34.4*  MCV 88.4 87.5 86.9  88.6  --   --  83.4 83.9  PLT 247 219 182 187  --   --  158 236     Basic Metabolic Panel: Recent Labs  Lab 08/21/21 0413 08/21/21 1510 08/22/21 0318 08/23/21 0333 08/23/21 1055 08/24/21 0216  NA 139  --  134* 123* 126* 139  K 3.7 3.7 4.4 2.8* 3.5 3.2*  CL 107  --  100 78* 86* 96*  CO2 25  --  27 32 30 32  GLUCOSE 183*  --  171* 120* 143* 142*  BUN 14  --  5* <5* <5* 6*  CREATININE 0.87  --  0.68 0.59 0.68 0.85  CALCIUM 8.7*  --  8.1* 7.7* 8.1* 8.3*  MG 1.6* 2.5* 2.1 1.2* 2.4 2.2  PHOS 3.2  --  3.0 2.4* 2.1* 3.1    GFR: Estimated Creatinine Clearance: 47.4 mL/min (by C-G formula based on SCr of 0.85 mg/dL). Recent Labs  Lab 08/20/21 1708 08/20/21 1709 08/20/21 2011 08/21/21 0413 08/21/21 1521 08/21/21 2030 08/22/21 0318 08/23/21 0333 08/24/21 0216  PROCALCITON 0.16  --   --   --   --   --   --   --   --   WBC 15.5*  --   --    < >  --  8.6 10.4 9.0 7.6  LATICACIDVEN  --  2.3* 1.3  --  1.1  --   --   --   --    < > = values in this interval not displayed.     Liver Function Tests: Recent Labs  Lab 08/20/21 1708 08/24/21 0216  AST 44* 21  ALT 34 14  ALKPHOS 114 81  BILITOT 0.6 0.7  PROT 7.7 6.3*  ALBUMIN 3.0* 2.7*    ABG    Component Value Date/Time   HCO3 27.7 08/21/2021 1521   O2SAT 85.6 08/21/2021 1521     CBG: Recent Labs  Lab 08/21/21 0122  GLUCAP 140*     Allergies Allergies  Allergen Reactions   Amoxicillin-Pot Clavulanate Diarrhea and Rash    Other reaction(s): Unknown   Biaxin [Clarithromycin] Diarrhea    Other reaction(s): Unknown   Oxycodone Itching   Codeine Rash   Nitrofurantoin Rash     Home Medications  Prior to Admission medications   Medication Sig Start Date End Date Taking? Authorizing Provider  acidophilus (RISAQUAD) CAPS capsule Take 1 capsule by mouth 3 (three) times daily for 14 days. 08/16/21 08/30/21 Yes Nicole Kindred A, DO  alum & mag hydroxide-simeth (MAALOX/MYLANTA) 200-200-20 MG/5ML suspension Take 15  mLs by mouth every 4 (four) hours as needed for indigestion or heartburn. 08/16/21  Yes Ezekiel Slocumb, DO  Cholecalciferol 25 MCG (1000 UT) capsule Take 1,000 Units by mouth daily. 04/27/21  Yes [provider]  guaiFENesin (MUCINEX) 600 MG 12 hr tablet Take 1 tablet (600 mg total) by mouth 2 (two) times daily. 08/16/21  Yes Nicole Kindred A, DO  guaiFENesin-dextromethorphan (ROBITUSSIN DM) 100-10 MG/5ML syrup Take 10 mLs by mouth every 4 (four) hours as needed for cough. 08/16/21  Yes Ezekiel Slocumb, DO  HYDROcodone-acetaminophen (NORCO) 7.5-325 MG tablet Take 1 tablet by mouth every 4 (four) hours as needed for severe pain. 08/16/21  Yes Nicole Kindred A, DO  methocarbamol (ROBAXIN) 500 MG tablet Take 1 tablet (500 mg total) by mouth  every 8 (eight) hours as needed for muscle spasms. 08/16/21  Yes Nicole Kindred A, DO  nicotine (NICODERM CQ - DOSED IN MG/24 HOURS) 21 mg/24hr patch Place 1 patch (21 mg total) onto the skin daily as needed (nicotine craving). 08/16/21  Yes Nicole Kindred A, DO  nystatin (MYCOSTATIN/NYSTOP) powder Apply 1 Application topically 2 (two) times daily as needed. 05/13/21  Yes [provider]  ondansetron (ZOFRAN) 4 MG tablet Take 1 tablet (4 mg total) by mouth every 6 (six) hours as needed for nausea. 08/16/21  Yes Nicole Kindred A, DO  traMADol (ULTRAM) 50 MG tablet Take 1 tablet (50 mg total) by mouth every 6 (six) hours as needed for moderate pain. 08/16/21  Yes Nicole Kindred A, DO  VENTOLIN HFA 108 (90 Base) MCG/ACT inhaler Inhale 2 puffs into the lungs 2 (two) times daily. 04/12/21  Yes [provider]    Scheduled Meds:  budesonide (PULMICORT) nebulizer solution  0.25 mg Nebulization BID   Chlorhexidine Gluconate Cloth  6 each Topical Q0600   enoxaparin (LOVENOX) injection  40 mg Subcutaneous Q24H   fentaNYL       hydrocortisone sod succinate (SOLU-CORTEF) inj  100 mg Intravenous BID   ipratropium-albuterol  3 mL Nebulization TID   midazolam        midodrine  10 mg Oral TID WC   pantoprazole  40 mg Oral Daily   Continuous Infusions:  sodium chloride     norepinephrine (LEVOPHED) Adult infusion 5 mcg/min (08/24/21 1315)   piperacillin-tazobactam (ZOSYN)  IV 3.375 g (08/24/21 1633)   PRN Meds:.acetaminophen, atropine, docusate sodium, fentaNYL, HYDROcodone-acetaminophen, ipratropium-albuterol, loperamide, midazolam, morphine injection, ondansetron (ZOFRAN) IV, phenol, polyethylene glycol    DVT/GI PRX  assessed I Assessed the need for Labs I Assessed the need for Foley I Assessed the need for Central Venous Line Family Discussion when available I Assessed the need for Mobilization I made an Assessment of medications to be adjusted accordingly Safety Risk assessment completed  CASE DISCUSSED IN MULTIDISCIPLINARY ROUNDS WITH ICU TEAM     Critical Care Time devoted to patient care services described in this note is 45 minutes.  Critical care was necessary to treat /prevent imminent and life-threatening deterioration. Overall, patient is critically ill, prognosis is guarded.    Corrin Parker, M.D.  [facility]  Medical Director [place] Director [facility] Cardio-Pulmonary Department

## 2021-08-24 NOTE — Consult Note (Signed)
Chief Complaint: Patient was seen in consultation today for sepsis at the request of Surgery  Referring Physician(s): Dr Herbert Pun  Supervising Physician: Mir, Sharen Heck  Patient Status: [facility] - In-pt  History of Present Illness: Brianna Briggs is a 71 y.o. female with significant PMH of chronic respiratory failure with hypoxia, MDD, HLD, tobacco abuse, COPD, right breast cancer s/p mastectomy, CAD, PAD, solitary kidney, recurrent diverticulitis w/ perforation and abscess s/p partial colectomy with anastomosis and loop ileostomy creation 07/31/21 who presented to the ED with chief complaints of intermittent abdominal pain associated with n/v and generalized weakness.   She was found to be hypotensive, have a fluid collection in the right hemipelvis concerning for abscess/infection, and possible aspiration pneumonia. IR consulted regarding fluid collection and Dr. Dwaine Gale approves CT guided aspiration.  Pt is alert and sitting in bedside chair.  Husband is at bedside.  Patient is tearful and expresses frustration about her health journey.  She wants to find out what the source of infection is.  Past Medical History:  Diagnosis Date   Allergic genetic state    Breast cancer, right ([place]) 1996   Mastectomy   History of chicken pox    History of colon polyps    Hyperlipidemia    Single kidney    Squamous cell carcinoma of skin 01/27/2019   Right inferior lateral thigh. KA-type    Past Surgical History:  Procedure Laterality Date   ABDOMINAL HYSTERECTOMY     COLONOSCOPY WITH PROPOFOL N/A 05/01/2019   Procedure: COLONOSCOPY WITH PROPOFOL;  Surgeon: Toledo, Benay Pike, MD;  Location: [facility] ENDOSCOPY;  Service: Gastroenterology;  Laterality: N/A;   ESOPHAGOGASTRODUODENOSCOPY (EGD) WITH PROPOFOL N/A 05/01/2019   Procedure: ESOPHAGOGASTRODUODENOSCOPY (EGD) WITH PROPOFOL;  Surgeon: Toledo, Benay Pike, MD;  Location: [facility] ENDOSCOPY;  Service: Gastroenterology;  Laterality: N/A;   IR  CATHETER TUBE CHANGE  06/29/2021   IR RADIOLOGIST EVAL & MGMT  06/23/2021   IR RADIOLOGIST EVAL & MGMT  07/14/2021   LAPAROTOMY N/A 07/31/2021   Procedure: EXPLORATORY LAPAROTOMY;  Surgeon: Herbert Pun, MD;  Location: [facility] ORS;  Service: General;  Laterality: N/A;   MASTECTOMY     PARTIAL COLECTOMY N/A 07/31/2021   Procedure: Oliver Hum ILEOSTOMY;  Surgeon: Herbert Pun, MD;  Location: [facility] ORS;  Service: General;  Laterality: N/A;   SHOULDER ARTHROSCOPY WITH SUBACROMIAL DECOMPRESSION, ROTATOR CUFF REPAIR AND BICEP TENDON REPAIR Left 01/13/2008   TONSILLECTOMY     TUBAL LIGATION      Allergies: Amoxicillin-pot clavulanate, Biaxin [clarithromycin], Oxycodone, Codeine, and Nitrofurantoin  Medications: Prior to Admission medications   Medication Sig Start Date End Date Taking? Authorizing Provider  acidophilus (RISAQUAD) CAPS capsule Take 1 capsule by mouth 3 (three) times daily for 14 days. 08/16/21 08/30/21 Yes Nicole Kindred A, DO  alum & mag hydroxide-simeth (MAALOX/MYLANTA) 200-200-20 MG/5ML suspension Take 15 mLs by mouth every 4 (four) hours as needed for indigestion or heartburn. 08/16/21  Yes Ezekiel Slocumb, DO  Cholecalciferol 25 MCG (1000 UT) capsule Take 1,000 Units by mouth daily. 04/27/21  Yes [provider]  guaiFENesin (MUCINEX) 600 MG 12 hr tablet Take 1 tablet (600 mg total) by mouth 2 (two) times daily. 08/16/21  Yes Nicole Kindred A, DO  guaiFENesin-dextromethorphan (ROBITUSSIN DM) 100-10 MG/5ML syrup Take 10 mLs by mouth every 4 (four) hours as needed for cough. 08/16/21  Yes Ezekiel Slocumb, DO  HYDROcodone-acetaminophen (NORCO) 7.5-325 MG tablet Take 1 tablet by mouth every 4 (four) hours as needed for severe pain. 08/16/21  Yes Nicole Kindred A, DO  methocarbamol (ROBAXIN) 500 MG tablet Take 1 tablet (500 mg total) by mouth every 8 (eight) hours as needed for muscle spasms. 08/16/21  Yes Nicole Kindred A, DO  nicotine (NICODERM CQ - DOSED IN MG/24 HOURS) 21  mg/24hr patch Place 1 patch (21 mg total) onto the skin daily as needed (nicotine craving). 08/16/21  Yes Nicole Kindred A, DO  nystatin (MYCOSTATIN/NYSTOP) powder Apply 1 Application topically 2 (two) times daily as needed. 05/13/21  Yes [provider]  ondansetron (ZOFRAN) 4 MG tablet Take 1 tablet (4 mg total) by mouth every 6 (six) hours as needed for nausea. 08/16/21  Yes Nicole Kindred A, DO  traMADol (ULTRAM) 50 MG tablet Take 1 tablet (50 mg total) by mouth every 6 (six) hours as needed for moderate pain. 08/16/21  Yes Nicole Kindred A, DO  VENTOLIN HFA 108 (90 Base) MCG/ACT inhaler Inhale 2 puffs into the lungs 2 (two) times daily. 04/12/21  Yes [provider]     Family History  Problem Relation Age of Onset   Cancer Father    Kidney failure Mother     Social History   Socioeconomic History   Marital status: Married    Spouse name: Not on file   Number of children: Not on file   Years of education: Not on file   Highest education level: Not on file  Occupational History   Not on file  Tobacco Use   Smoking status: Every Day    Packs/day: 1.50    Years: 40.00    Total pack years: 60.00    Types: Cigarettes   Smokeless tobacco: Never  Vaping Use   Vaping Use: Never used  Substance and Sexual Activity   Alcohol use: No   Drug use: No   Sexual activity: Yes    Partners: Male  Other Topics Concern   Not on file  Social History Narrative   Lives at home with husband   Social Determinants of Health   Financial Resource Strain: Not on file  Food Insecurity: Not on file  Transportation Needs: Not on file  Physical Activity: Not on file  Stress: Not on file  Social Connections: Not on file    Review of Systems: A 12 point ROS discussed and pertinent positives are indicated in the HPI above.  All other systems are negative.  Review of Systems  Constitutional:  Positive for activity change, appetite change, chills and unexpected weight change.   HENT: Negative.    Eyes: Negative.   Respiratory:  Positive for shortness of breath.   Cardiovascular: Negative.   Gastrointestinal:  Positive for abdominal pain and nausea.  Endocrine: Positive for cold intolerance.  Genitourinary: Negative.   Skin:  Positive for rash.  Hematological: Negative.   Psychiatric/Behavioral: Negative.      Vital Signs: BP (!) 95/55   Pulse 62   Temp 98.4 F (36.9 C)   Resp 16   Ht '5\' 2"'$  (1.575 m)   Wt 109 lb 2 oz (49.5 kg)   SpO2 98%   BMI 19.96 kg/m   Physical Exam Vitals reviewed.  Constitutional:      Appearance: She is ill-appearing.  HENT:     Head: Normocephalic and atraumatic.     Mouth/Throat:     Mouth: Mucous membranes are dry.     Pharynx: Oropharynx is clear.  Eyes:     Extraocular Movements: Extraocular movements intact.     Conjunctiva/sclera: Conjunctivae normal.  Cardiovascular:  Rate and Rhythm: Normal rate.     Pulses: Normal pulses.  Pulmonary:     Effort: Pulmonary effort is normal.     Breath sounds: Decreased air movement present.     Comments: On O2 via [place] Skin:    General: Skin is dry.     Coloration: Skin is pale.     Findings: Rash present.  Neurological:     General: No focal deficit present.     Mental Status: She is alert and oriented to person, place, and time.     Imaging: ECHOCARDIOGRAM COMPLETE  Result Date: 08/22/2021    ECHOCARDIOGRAM REPORT   Patient Name:   ALETHA ALLEBACH Date of Exam: 08/22/2021 Medical Rec #:  [id number]          Height:       62.0 in Accession #:    [id number]         Weight:       112.4 lb Date of Birth:  14-Feb-1950         BSA:          1.497 m Patient Age:    68 years           BP:           106/59 mmHg Patient Gender: F                  HR:           75 bpm. Exam Location:  [facility] Procedure: 2D Echo, Cardiac Doppler and Color Doppler Indications:     R94.31 Abnormal EKG  History:         Patient has prior history of Echocardiogram examinations, most                   recent 08/01/2021. Risk Factors:Dyslipidemia.  Sonographer:     Bernadene Person RDCS Referring Phys:  [id number] Teressa Lower Diagnosing Phys: Kathlyn Sacramento MD IMPRESSIONS  1. Left ventricular ejection fraction, by estimation, is 55 to 60%. The left ventricle has normal function. The left ventricle has no regional wall motion abnormalities. Left ventricular diastolic parameters are indeterminate.  2. Right ventricular systolic function is normal. The right ventricular size is normal. Tricuspid regurgitation signal is inadequate for assessing PA pressure.  3. The mitral valve is normal in structure. Trivial mitral valve regurgitation. No evidence of mitral stenosis.  4. The aortic valve is normal in structure. Aortic valve regurgitation is not visualized. No aortic stenosis is present.  5. The inferior vena cava is dilated in size with <50% respiratory variability, suggesting right atrial pressure of 15 mmHg. FINDINGS  Left Ventricle: Left ventricular ejection fraction, by estimation, is 55 to 60%. The left ventricle has normal function. The left ventricle has no regional wall motion abnormalities. The left ventricular internal cavity size was normal in size. There is  no left ventricular hypertrophy. Left ventricular diastolic parameters are indeterminate. Right Ventricle: The right ventricular size is normal. No increase in right ventricular wall thickness. Right ventricular systolic function is normal. Tricuspid regurgitation signal is inadequate for assessing PA pressure. The tricuspid regurgitant velocity is 2.60 m/s, and with an assumed right atrial pressure of 15 mmHg, the estimated right ventricular systolic pressure is 10.1 mmHg. Left Atrium: Left atrial size was normal in size. Right Atrium: Right atrial size was normal in size. Pericardium: There is no evidence of pericardial effusion. Mitral Valve: The mitral valve is normal in structure. Trivial mitral valve regurgitation. No  evidence of mitral valve  stenosis. Tricuspid Valve: The tricuspid valve is normal in structure. Tricuspid valve regurgitation is trivial. No evidence of tricuspid stenosis. Aortic Valve: The aortic valve is normal in structure. Aortic valve regurgitation is not visualized. No aortic stenosis is present. Pulmonic Valve: The pulmonic valve was normal in structure. Pulmonic valve regurgitation is mild. No evidence of pulmonic stenosis. Aorta: The aortic root is normal in size and structure. Venous: The inferior vena cava is dilated in size with less than 50% respiratory variability, suggesting right atrial pressure of 15 mmHg. IAS/Shunts: No atrial level shunt detected by color flow Doppler.  LEFT VENTRICLE PLAX 2D LVIDd:         4.79 cm     Diastology LVIDs:         3.61 cm     LV e' medial:    7.22 cm/s LV PW:         0.86 cm     LV E/e' medial:  14.1 LV IVS:        0.66 cm     LV e' lateral:   6.73 cm/s LVOT diam:     2.10 cm     LV E/e' lateral: 15.2 LV SV:         70 LV SV Index:   47 LVOT Area:     3.46 cm  LV Volumes (MOD) LV vol d, MOD A2C: 90.1 ml LV vol d, MOD A4C: 75.8 ml LV vol s, MOD A2C: 39.2 ml LV vol s, MOD A4C: 32.2 ml LV SV MOD A2C:     50.9 ml LV SV MOD A4C:     75.8 ml LV SV MOD BP:      47.4 ml RIGHT VENTRICLE RV S prime:     11.80 cm/s TAPSE (M-mode): 2.4 cm LEFT ATRIUM             Index        RIGHT ATRIUM           Index LA diam:        4.00 cm 2.67 cm/m   RA Area:     14.50 cm LA Vol (A2C):   44.5 ml 29.73 ml/m  RA Volume:   39.00 ml  26.06 ml/m LA Vol (A4C):   37.2 ml 24.86 ml/m LA Biplane Vol: 42.7 ml 28.53 ml/m  AORTIC VALVE             PULMONIC VALVE LVOT Vmax:   103.00 cm/s PR End Diast Vel: 9.00 msec LVOT Vmean:  64.900 cm/s LVOT VTI:    0.202 m  AORTA Ao Root diam: 3.20 cm Ao Asc diam:  3.50 cm MITRAL VALVE                TRICUSPID VALVE MV Area (PHT): 3.27 cm     TR Peak grad:   27.0 mmHg MV Decel Time: 232 msec     TR Vmax:        260.00 cm/s MV E velocity: 102.00 cm/s MV A velocity: 97.40 cm/s    SHUNTS MV E/A ratio:  1.05         Systemic VTI:  0.20 m                             Systemic Diam: 2.10 cm Kathlyn Sacramento MD Electronically signed by Kathlyn Sacramento MD Signature Date/Time: 08/22/2021/1:10:04 PM    Final    CT ABDOMEN  PELVIS W CONTRAST  Result Date: 08/21/2021 CLINICAL DATA:  Anemia, recent partial colectomy EXAM: CT ABDOMEN AND PELVIS WITH CONTRAST TECHNIQUE: Multidetector CT imaging of the abdomen and pelvis was performed using the standard protocol following bolus administration of intravenous contrast. RADIATION DOSE REDUCTION: This exam was performed according to the departmental dose-optimization program which includes automated exposure control, adjustment of the mA and/or kV according to patient size and/or use of iterative reconstruction technique. CONTRAST:  40m OMNIPAQUE IOHEXOL 350 MG/ML SOLN COMPARISON:  07/29/2021, 08/20/2021 FINDINGS: Lower chest: Stable right middle lobe atelectasis and bronchiectasis. Progressive hypoventilatory changes at the left lung base. Hepatobiliary: Small gallstones are again identified, along with vicarious excretion of previously administered contrast. No evidence of acute cholecystitis. Liver is unremarkable. No biliary duct dilation. Pancreas: Unremarkable. No pancreatic ductal dilatation or surrounding inflammatory changes. Spleen: Normal in size without focal abnormality. Adrenals/Urinary Tract: Stable subcentimeter left adrenal myelolipoma. Right adrenal is unremarkable. Chronic right renal atrophy unchanged. Stable appearance of the left kidney, with cortical scarring and benign simple cysts. No urinary tract calculi or obstructive uropathy within the left kidney. The bladder is decompressed with a Foley catheter. Stomach/Bowel: Postsurgical changes are seen from prior sigmoid colon resection and reanastomosis. Stable inflammatory changes within the lower pelvic mesentery surrounding the anastomosis. Stable small fluid collections within the right  hemipelvis, measuring 2.1 x 1.6 cm image 71/2 and 2.3 x 1.9 cm image 68/2, suspicious for abscesses. Loop ileostomy is seen within the right lower quadrant. Stable parastomal hernia. Stable small gas/fluid collection within the anterior abdominal wall adjacent to the diverting ileostomy, measuring 1.1 cm, which may be postsurgical. No bowel obstruction or ileus. Vascular/Lymphatic: Left femoral venous catheter tip within the left common iliac vein. Stable atherosclerosis of the aorta and its branches. No pathologic adenopathy within the abdomen or pelvis. Reproductive: The uterus is surgically absent. Ovaries are not well visualized and may be surgically absent. Other: No free fluid or free intraperitoneal gas. Fat containing parastomal hernia. Musculoskeletal: No acute or destructive bony lesions. Reconstructed images demonstrate no additional findings. IMPRESSION: 1. Stable inflammatory changes within the lower pelvis adjacent to the site of prior sigmoid colon resection and reanastomosis. No evidence of anastomotic breakdown. 2. Stable small right hemipelvic fluid collections consistent with abscesses. 3. Right lower quadrant loop ileostomy, with inflammatory changes and small gas fluid level within the adjacent subcutaneous fat. This may reflect postsurgical change or small postsurgical abscess. 4. Fat containing parastomal hernia. 5. Cholelithiasis without cholecystitis. 6.  Aortic Atherosclerosis (ICD10-I70.0). Electronically Signed   By: MRanda NgoM.D.   On: 08/21/2021 23:07   CT HEAD WO CONTRAST (5MM)  Result Date: 08/21/2021 CLINICAL DATA:  Headache, sudden, severe Headache, new or worsening (Age >= 50y) EXAM: CT HEAD WITHOUT CONTRAST TECHNIQUE: Contiguous axial images were obtained from the base of the skull through the vertex without intravenous contrast. RADIATION DOSE REDUCTION: This exam was performed according to the departmental dose-optimization program which includes automated exposure  control, adjustment of the mA and/or kV according to patient size and/or use of iterative reconstruction technique. COMPARISON:  None Available. FINDINGS: Brain: No intracranial hemorrhage, mass effect, or midline shift. No hydrocephalus. The basilar cisterns are patent. No evidence of territorial infarct or acute ischemia. No extra-axial or intracranial fluid collection. Vascular: Atherosclerosis of skullbase vasculature without hyperdense vessel or abnormal calcification. Skull: No fracture or focal lesion. Sinuses/Orbits: Mucosal thickening of the left maxillary sinus with small fluid level. Scattered mucosal thickening throughout the ethmoid air cells. There is  no mastoid effusion. Other: None. IMPRESSION: 1. No acute intracranial abnormality. 2. Paranasal sinus mucosal thickening with small fluid level in the left maxillary sinus, can be seen with acute sinusitis in the appropriate clinical setting. Electronically Signed   By: Keith Rake M.D.   On: 08/21/2021 22:57   CT ABDOMEN PELVIS W CONTRAST  Result Date: 08/20/2021 CLINICAL DATA:  Abdominal pain, acute, nonlocalized Recent partial colectomy. EXAM: CT ABDOMEN AND PELVIS WITH CONTRAST TECHNIQUE: Multidetector CT imaging of the abdomen and pelvis was performed using the standard protocol following bolus administration of intravenous contrast. RADIATION DOSE REDUCTION: This exam was performed according to the departmental dose-optimization program which includes automated exposure control, adjustment of the mA and/or kV according to patient size and/or use of iterative reconstruction technique. CONTRAST:  68m OMNIPAQUE IOHEXOL 350 MG/ML SOLN COMPARISON:  Most recent CT 07/29/2021 FINDINGS: Lower chest: Assessed on concurrent chest CT, reported separately. Hepatobiliary: Focal fatty infiltration adjacent to the falciform ligament, unchanged there is a tiny subcapsular cyst in the inferior right hepatic lobe, stable. Layering stones or sludge in the  gallbladder without abnormal gallbladder distention or pericholecystic inflammation. Pancreas: No ductal dilatation or inflammation. Spleen: Normal in size without focal abnormality. 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 Normal right adrenal gland. Chronic right renal atrophy with no visible renal parenchyma. Chronic right renal cysts versus hydronephrosis as well as hydroureter, transition to decompressed ureter in the midportion, no stone or cause for obstruction. Homogeneous enhancement of the left kidney. Areas of cortical scarring in the left kidney. Simple left renal cysts which need no dedicated follow-up. Partially distended urinary bladder, no wall thickening. Stomach/Bowel: Interval loop ileostomy in the right abdomen. Small peristomal hernia. Enteric sutures in the colon. There is fluid within the insitu colon. There is no small bowel obstruction or inflammatory change. Vascular/Lymphatic: Moderate aortic atherosclerosis. No aortic aneurysm. Patent portal and splenic veins. No mesenteric vein thrombosis. No bulky abdominopelvic adenopathy. Reproductive: Hysterectomy.  No adnexal mass. Other: There are small pelvic fluid collections. Right pelvic collection measures 2.3 x 1.9 x 2.3 cm, series 2, image 70. A more posterior right pelvic fluid collection measures 2.6 x 2 x 2.2 cm, series 2, image 68. There is slight peripheral enhancement of both of these collections. Additional ill-defined areas of fluid in the pelvis. No free air. Laparotomy changes. No subcutaneous collection. Musculoskeletal: Lower lumbar facet hypertrophy. Prominent Schmorl's node within the superior endplate of L2. No acute osseous findings. No intramuscular collection. IMPRESSION: 1. Interval loop ileostomy in the right abdomen with small peristomal hernia. No bowel obstruction. 2. Two small right pelvic fluid collections, 2.6 and 2.3 cm largest dimension  respectively. Sterility is indeterminate, although the presence of peripheral enhancement suggests abscess/infection. 3. Layering stones or sludge in the gallbladder without CT findings of acute cholecystitis. 4. Additional stable chronic and incidental findings as described. Aortic Atherosclerosis (ICD10-I70.0). Electronically Signed   By: MKeith RakeM.D.   On: 08/20/2021 20:30   CT Angio Chest PE W and/or Wo Contrast  Result Date: 08/20/2021 CLINICAL DATA:  Pulmonary embolism (PE) suspected, high prob Recent partial colectomy. EXAM: CT ANGIOGRAPHY CHEST WITH CONTRAST TECHNIQUE: Multidetector CT imaging of the chest was performed using the standard protocol during bolus administration of intravenous contrast. Multiplanar CT image reconstructions and MIPs were obtained to evaluate the vascular anatomy. RADIATION DOSE REDUCTION: This exam was performed according to the departmental dose-optimization program which includes automated exposure control, adjustment of the  mA and/or kV according to patient size and/or use of iterative reconstruction technique. CONTRAST:  101m OMNIPAQUE IOHEXOL 350 MG/ML SOLN COMPARISON:  Chest CT 03/21/2021, radiograph earlier today FINDINGS: Cardiovascular: There are no filling defects within the pulmonary arteries to suggest pulmonary embolus. Moderate calcified noncalcified atheromatous plaque throughout the thoracic aorta. No dissection or acute aortic findings. Heart is normal in size. There is generalized lipomatous hypertrophy, including the intra-atrial septum. Small pericardial effusion anteriorly. Mediastinum/Nodes: No enlarged lymph nodes. The esophagus is decompressed. No thyroid nodule. Lungs/Pleura: Moderate to advanced emphysema. There is right middle lobe collapse with frothy debris in the right middle lobe bronchus. Minimal retained mucus is also seen within the trachea. Peripheral atelectasis in the left lower lobe and lingula. No pleural effusion. Scattered  pulmonary nodules are stable from prior exam, largest measuring 6 mm in the right upper lobe series 6, image 56. Upper Abdomen: Assessed on concurrent abdominal CT, reported separately. Musculoskeletal: There are no acute or suspicious osseous abnormalities. Right mastectomy with breast implant. Review of the MIP images confirms the above findings. IMPRESSION: 1. No pulmonary embolus. 2. Right middle lobe collapse with frothy debris in the right middle lobe bronchus, may be mucous plugging or seen in the setting of aspiration. 3. Moderate to advanced emphysema. 4. Scattered pulmonary nodules are stable from prior exam, largest measuring 6 mm in the right upper lobe. Recommend continued annual lung cancer screening CT. 5. Lipomatous hypertrophy of the intra-atrial septum. Aortic Atherosclerosis (ICD10-I70.0) and Emphysema (ICD10-J43.9). Electronically Signed   By: MKeith RakeM.D.   On: 08/20/2021 20:14   CT Cervical Spine Wo Contrast  Result Date: 08/20/2021 CLINICAL DATA:  Cervical compression fracture EXAM: CT CERVICAL SPINE WITHOUT CONTRAST TECHNIQUE: Multidetector CT imaging of the cervical spine was performed without intravenous contrast. Multiplanar CT image reconstructions were also generated. RADIATION DOSE REDUCTION: This exam was performed according to the departmental dose-optimization program which includes automated exposure control, adjustment of the mA and/or kV according to patient size and/or use of iterative reconstruction technique. COMPARISON:  None Available. FINDINGS: Alignment: Normal. Skull base and vertebrae: No acute fracture. 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 No primary bone lesion or focal pathologic process. Soft tissues and spinal canal: No prevertebral fluid or swelling. No visible canal hematoma. Disc levels: Mild disc space height loss and osteophytosis of the lower cervical levels. Upper chest: Severe emphysema in the included lung apices. Other: None.  IMPRESSION: 1. No fracture or static subluxation of the cervical spine. 2. Mild multilevel cervical disc degenerative disease. 3. Severe emphysema in the included lung apices. Emphysema (ICD10-J43.9). Electronically Signed   By: ADelanna AhmadiM.D.   On: 08/20/2021 20:12   DG Chest Port 1 View  Result Date: 08/20/2021 CLINICAL DATA:  Dehydration loss of appetite.  Possible sepsis. EXAM: PORTABLE CHEST 1 VIEW COMPARISON:  08/07/2021 FINDINGS: 1722 hours. Markedly improved aeration in the right base. There is some trace atelectasis or linear scarring at the left base. The lungs are clear without focal pneumonia, edema, pneumothorax or pleural effusion. The cardiopericardial silhouette is within normal limits for size. The visualized bony structures of the thorax are unremarkable. Telemetry leads overlie the chest. IMPRESSION: No active disease. Electronically Signed   By: EMisty StanleyM.D.   On: 08/20/2021 17:32   DG Chest Port 1 View  Result Date: 08/07/2021 CLINICAL DATA:  COVID pneumonia. EXAM: PORTABLE CHEST 1 VIEW COMPARISON:  08/03/2021 and 06/08/2021 FINDINGS: Patient is rotated to the right. Lungs are adequately inflated  with slight worsening opacification over the right base with mild elevation of the right hemidiaphragm as findings may be due to atelectasis with small associated effusion, although infection is possible. Remainder of the right lung as well as the left lung is unremarkable. Cardiomediastinal silhouette and remainder of the exam is unchanged. IMPRESSION: Slight worsening right base opacification likely atelectasis with small associated effusion. Infection in the right base is possible. Electronically Signed   By: Marin Olp M.D.   On: 08/07/2021 08:28   DG Chest Port 1 View  Result Date: 08/03/2021 CLINICAL DATA:  Sepsis. EXAM: PORTABLE CHEST 1 VIEW COMPARISON:  Jun 08, 2021. FINDINGS: The heart size and mediastinal contours are within normal limits. Bibasilar atelectasis or edema  is noted with small bilateral pleural effusions. The visualized skeletal structures are unremarkable. IMPRESSION: Mild bibasilar atelectasis or edema is noted with small bilateral pleural effusions. Electronically Signed   By: Marijo Conception M.D.   On: 08/03/2021 08:55   ECHOCARDIOGRAM COMPLETE BUBBLE STUDY  Result Date: 08/01/2021    ECHOCARDIOGRAM REPORT   Patient Name:   TRISHNA CWIK Date of Exam: 08/01/2021 Medical Rec #:  [id number]          Height:       62.0 in Accession #:    [id number]         Weight:       118.4 lb Date of Birth:  04/15/1950         BSA:          1.530 m Patient Age:    27 years           BP:           90/54 mmHg Patient Gender: F                  HR:           75 bpm. Exam Location:  [facility] Procedure: 2D Echo, Cardiac Doppler, Color Doppler and Saline Contrast Bubble            Study Indications:     Cardiomyopathy  History:         Patient has no prior history of Echocardiogram examinations.                  Risk Factors:Dyslipidemia.  Sonographer:     Sherrie Sport Referring Phys:  [id number] [facility] CINTRON-DIAZ Diagnosing Phys: Kathlyn Sacramento MD IMPRESSIONS  1. Left ventricular ejection fraction, by estimation, is 55 to 60%. The left ventricle has normal function. The left ventricle has no regional wall motion abnormalities. Left ventricular diastolic parameters are consistent with Grade I diastolic dysfunction (impaired relaxation).  2. Right ventricular systolic function is normal. The right ventricular size is normal. Tricuspid regurgitation signal is inadequate for assessing PA pressure.  3. The mitral valve is normal in structure. No evidence of mitral valve regurgitation. No evidence of mitral stenosis.  4. The aortic valve is normal in structure. Aortic valve regurgitation is not visualized. Aortic valve sclerosis is present, with no evidence of aortic valve stenosis.  5. The inferior vena cava is normal in size with greater than 50% respiratory variability, suggesting right  atrial pressure of 3 mmHg.  6. Agitated saline contrast bubble study was negative, with no evidence of any interatrial shunt. FINDINGS  Left Ventricle: Left ventricular ejection fraction, by estimation, is 55 to 60%. The left ventricle has normal function. The left ventricle has no regional wall motion abnormalities. The left  ventricular internal cavity size was normal in size. There is  no left ventricular hypertrophy. Left ventricular diastolic parameters are consistent with Grade I diastolic dysfunction (impaired relaxation). Right Ventricle: The right ventricular size is normal. No increase in right ventricular wall thickness. Right ventricular systolic function is normal. Tricuspid regurgitation signal is inadequate for assessing PA pressure. The tricuspid regurgitant velocity is 1.82 m/s, and with an assumed right atrial pressure of 3 mmHg, the estimated right ventricular systolic pressure is 27.0 mmHg. Left Atrium: Left atrial size was normal in size. Right Atrium: Right atrial size was normal in size. Pericardium: There is no evidence of pericardial effusion. Mitral Valve: The mitral valve is normal in structure. No evidence of mitral valve regurgitation. No evidence of mitral valve stenosis. Tricuspid Valve: The tricuspid valve is normal in structure. Tricuspid valve regurgitation is not demonstrated. No evidence of tricuspid stenosis. Aortic Valve: The aortic valve is normal in structure. Aortic valve regurgitation is not visualized. Aortic valve sclerosis is present, with no evidence of aortic valve stenosis. Aortic valve mean gradient measures 5.5 mmHg. Aortic valve peak gradient measures 10.4 mmHg. Aortic valve area, by VTI measures 2.34 cm. Pulmonic Valve: The pulmonic valve was normal in structure. Pulmonic valve regurgitation is not visualized. No evidence of pulmonic stenosis. Aorta: The aortic root is normal in size and structure. Venous: The inferior vena cava is normal in size with greater than  50% respiratory variability, suggesting right atrial pressure of 3 mmHg. IAS/Shunts: No atrial level shunt detected by color flow Doppler. Agitated saline contrast was given intravenously to evaluate for intracardiac shunting. Agitated saline contrast bubble study was negative, with no evidence of any interatrial shunt.  LEFT VENTRICLE PLAX 2D LVIDd:         4.40 cm   Diastology LVIDs:         2.70 cm   LV e' medial:    4.68 cm/s LV PW:         1.00 cm   LV E/e' medial:  18.1 LV IVS:        0.80 cm   LV e' lateral:   7.94 cm/s LVOT diam:     2.00 cm   LV E/e' lateral: 10.7 LV SV:         66 LV SV Index:   43 LVOT Area:     3.14 cm  RIGHT VENTRICLE RV Basal diam:  3.20 cm RV S prime:     14.50 cm/s TAPSE (M-mode): 3.0 cm LEFT ATRIUM             Index        RIGHT ATRIUM           Index LA diam:        2.80 cm 1.83 cm/m   RA Area:     10.60 cm LA Vol (A2C):   37.2 ml 24.32 ml/m  RA Volume:   25.60 ml  16.73 ml/m LA Vol (A4C):   37.2 ml 24.32 ml/m LA Biplane Vol: 37.8 ml 24.71 ml/m  AORTIC VALVE AV Area (Vmax):    2.06 cm AV Area (Vmean):   1.93 cm AV Area (VTI):     2.34 cm AV Vmax:           161.50 cm/s AV Vmean:          109.500 cm/s AV VTI:            0.282 m AV Peak Grad:      10.4 mmHg  AV Mean Grad:      5.5 mmHg LVOT Vmax:         106.00 cm/s LVOT Vmean:        67.100 cm/s LVOT VTI:          0.210 m LVOT/AV VTI ratio: 0.75  AORTA Ao Root diam: 2.70 cm MITRAL VALVE                TRICUSPID VALVE MV Area (PHT): 2.96 cm     TR Peak grad:   13.2 mmHg MV Decel Time: 256 msec     TR Vmax:        182.00 cm/s MV E velocity: 84.80 cm/s MV A velocity: 117.00 cm/s  SHUNTS MV E/A ratio:  0.72         Systemic VTI:  0.21 m                             Systemic Diam: 2.00 cm Kathlyn Sacramento MD Electronically signed by Kathlyn Sacramento MD Signature Date/Time: 08/01/2021/10:21:59 AM    Final    CT ABDOMEN PELVIS W CONTRAST  Result Date: 07/29/2021 CLINICAL DATA:  Intra-abdominal abscess.  Evaluate drain placement.  EXAM: CT ABDOMEN AND PELVIS WITH CONTRAST TECHNIQUE: Multidetector CT imaging of the abdomen and pelvis was performed using the standard protocol following bolus administration of intravenous contrast. RADIATION DOSE REDUCTION: This exam was performed according to the departmental dose-optimization program which includes automated exposure control, adjustment of the mA and/or kV according to patient size and/or use of iterative reconstruction technique. CONTRAST:  141m OMNIPAQUE IOHEXOL 300 MG/ML  SOLN COMPARISON:  CT of the abdomen and pelvis July 27, 2021 FINDINGS: Lower chest: No acute abnormality. Hepatobiliary: High attenuation material in the gallbladder identified without wall thickening or adjacent fat stranding. The liver is unremarkable. Pancreas: Unremarkable. No pancreatic ductal dilatation or surrounding inflammatory changes. Spleen: Normal in size without focal abnormality. Adrenals/Urinary Tract: The 8 mm left adrenal myelolipoma is stable. No change the adrenal glands. The right kidney remains atrophic with cystic changes. The proximal right ureter remains prominent. The cyst in the medial left kidney is stable. Another cyst located posteriorly on image 34 is stable. No follow-up imaging recommended for the cysts. The left kidney is otherwise normal. The left adrenal gland is unremarkable. The bladder is unremarkable. Stomach/Bowel: The stomach and small bowel are within normal limits. There is persistent wall thickening and stranding along the sigmoid colon. The pigtail drainage catheter is stable, extending along the posterior aspect of the sigmoid colon. The small abscess adjacent to the pigtail catheter measures 2.1 by 0.6 cm today versus 2.7 x 0.8 cm previously. Anteriorly laterally is another abscess which may communicate which measures 3.0 by 1.3 cm on sagittal image 97 today versus 3.0 x 1.6 cm on the same image previously. This abscess tracks to the adjacent colon as identified on sagittal  image 101. This track contained mostly air previously but now contains mostly fluid today. This abscess also appears to track laterally and anteriorly as identified on sagittal image 102 and axial image 61. This tract appears to contain primarily air. The amount of fat stranding surrounding the affected loop of colon is similar to slightly worsened. The colon is otherwise unremarkable. Visualized appendix is normal. Vascular/Lymphatic: Calcified atherosclerotic changes are seen in the nonaneurysmal aorta. No adenopathy. Reproductive: Uterus and bilateral adnexa are unremarkable. Other: A small amount of fluid is identified posteriorly in the pelvis,  not identified previously. This fluid appears to be free and is likely reactive. No free air. Musculoskeletal: No change IMPRESSION: 1. Persistent abscess related to acute sigmoid diverticulitis. A portion of the abscess measuring 2.1 x 0.6 cm today measured 2.7 x 0.8 cm previously. Anteriorly and laterally is another abscess which may be connected to the first measuring 3.0 x 1.3 cm today versus 3.0 x 1.6 cm previously. Two tracks extend off this 3.0 x 1.3 cm abscess today. There is a fluid-filled tract extending to the colon which was air-filled before but unchanged in size. There is an anterior tract which is more conspicuous in the interval but primarily containing air. The amount of fat stranding in the region of the abscesses in colon is stable to mildly worsened. 2. Probable sludge or stones in the gallbladder. 3. There is a small amount of free fluid posteriorly in the pelvis, likely reactive. 4. No other significant interval changes. Electronically Signed   By: Dorise Bullion III M.D.   On: 07/29/2021 16:21   CT ABDOMEN PELVIS W CONTRAST  Result Date: 07/27/2021 CLINICAL DATA:  Peritonitis or perforation suspected diverticulitis with drain in place, more redness and pain at site of drain with discharge EXAM: CT ABDOMEN AND PELVIS WITH CONTRAST TECHNIQUE:  Multidetector CT imaging of the abdomen and pelvis was performed using the standard protocol following bolus administration of intravenous contrast. RADIATION DOSE REDUCTION: This exam was performed according to the departmental dose-optimization program which includes automated exposure control, adjustment of the mA and/or kV according to patient size and/or use of iterative reconstruction technique. CONTRAST:  139m OMNIPAQUE IOHEXOL 300 MG/ML  SOLN COMPARISON:  CT abdomen pelvis 07/19/2021 FINDINGS: Lower chest: No acute abnormality. Hepatobiliary: Unchanged hypodense area in the left hepatic lobe adjacent to the falciform, consistent with focal fat, as seen on recent MRI 02/03/2019. small amount of layering sludge or tiny stones in the gallbladder fundus. Pancreas: Unremarkable. No pancreatic ductal dilatation or surrounding inflammatory changes. Spleen: Normal in size without focal abnormality. 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 Unchanged severely atrophied right kidney with cystic changes. Unchanged left renal scarring, cortical thinning, and small cysts. No follow-up imaging is recommended. No left renal hydroureteronephrosis. There is mild bladder wall thickening adjacent stranding, reactive to adjacent diverticulitis. Stomach/Bowel: The stomach is within normal limits. There is no evidence of bowel obstruction.The appendix is normal. There is persistent wall thickening and adjacent stranding along the sigmoid colon. There is a left-sided percutaneous drain in place, with small abscess at the pigtail measuring 2.7 x 0.8 cm, which previously measured 1.9 x 0.9 cm. Anterolaterally, there is another abscess which possibly communicates measuring up to 3.0 x 1.6 cm (series 6, image 79), which previously measured up to 4.1 x 2.2 cm. This abuts a loop of small bowel (series 2, image 53). Vascular/Lymphatic: Aortoiliac atherosclerosis. No AAA. No  lymphadenopathy. Reproductive: Unremarkable. Other: Tiny fat containing umbilical hernia. Small fat containing inguinal hernias. No bowel containing hernia. Musculoskeletal: No acute osseous abnormality. No suspicious osseous lesion. Multilevel degenerative changes of the spine with grade 1 anterolisthesis at L4-L5. IMPRESSION: Persistent abscesses related to acute sigmoid diverticulitis, measuring 2.7 x 0.8 cm along the pigtail of the percutaneous drain which is slightly increased, and a slightly decreased anterolateral collection measuring 3.0 x 1.6 cm which previously measured 4.1 x 2.2 cm. The anterolateral collection abuts a loop of small bowel with mild wall thickening inflammatory change. There is also adjacent focal  wall thickening of the bladder dome which is likely reactive. Electronically Signed   By: Maurine Simmering M.D.   On: 07/27/2021 15:14    Labs:  CBC: Recent Labs    08/21/21 2030 08/22/21 0318 08/22/21 1200 08/22/21 1815 08/23/21 0333 08/24/21 0216  WBC 8.6 10.4  --   --  9.0 7.6  HGB 9.4* 9.2* 8.9* 8.9* 9.0* 11.5*  HCT 28.6* 28.7* 26.8* 26.7* 26.1* 34.4*  PLT 182 187  --   --  158 236    COAGS: Recent Labs    06/07/21 0402 07/19/21 0445 08/20/21 1708  INR 1.2 1.3* 1.1  APTT  --   --  31    BMP: Recent Labs    08/22/21 0318 08/23/21 0333 08/23/21 1055 08/24/21 0216  NA 134* 123* 126* 139  K 4.4 2.8* 3.5 3.2*  CL 100 78* 86* 96*  CO2 27 32 30 32  GLUCOSE 171* 120* 143* 142*  BUN 5* <5* <5* 6*  CALCIUM 8.1* 7.7* 8.1* 8.3*  CREATININE 0.68 0.59 0.68 0.85  GFRNONAA >60 >60 >60 >60    LIVER FUNCTION TESTS: Recent Labs    08/07/21 0425 08/08/21 0455 08/20/21 1708 08/24/21 0216  BILITOT 0.7 0.6 0.6 0.7  AST 29 32 44* 21  ALT 13 15 34 14  ALKPHOS 31* 36* 114 81  PROT 6.0* 6.3* 7.7 6.3*  ALBUMIN 2.5* 2.8* 3.0* 2.7*    Assessment and Plan:  Post operative pelvic abscess --amenable to percutaneous aspiration --Pt is hesitant but agreeable to  proceed.  She is concerned for pain.  Risks and benefits discussed with the patient including bleeding, infection, damage to adjacent structures, bowel perforation/fistula connection, and sepsis.  All of the patient's questions were answered, patient is agreeable to proceed. Consent signed and in chart.   Thank you for this interesting consult.  I greatly enjoyed meeting Brianna Briggs and look forward to participating in their care.  A copy of this report was sent to the requesting provider on this date.  Electronically Signed: Pasty Spillers, PA 08/24/2021, 9:45 AM   I spent a total of 40 Minutes in face to face in clinical consultation, greater than 50% of which was counseling/coordinating care for pelvic abscess

## 2021-08-24 NOTE — Progress Notes (Signed)
Palliative: Chart review completed.  No events overnight.  Brianna Briggs remains acutely/critically ill.  She is scheduled for aspiration of fluid in abdomen today.  Detail conference with CCM attending and bedside nursing staff related to patient condition, needs.  Plan:   At this point continue full scope/full code, time for outcomes.  Aspiration of abdomen today.  25 minutes  Quinn Axe, NP Palliative medicine team Team phone [phone] Greater than 50% of this time was spent counseling and coordinating care related to the above assessment and plan.

## 2021-08-25 DIAGNOSIS — Z515 Encounter for palliative care: Secondary | ICD-10-CM | POA: Diagnosis not present

## 2021-08-25 DIAGNOSIS — E872 Acidosis, unspecified: Secondary | ICD-10-CM | POA: Diagnosis not present

## 2021-08-25 DIAGNOSIS — R6520 Severe sepsis without septic shock: Secondary | ICD-10-CM | POA: Diagnosis not present

## 2021-08-25 DIAGNOSIS — A419 Sepsis, unspecified organism: Secondary | ICD-10-CM | POA: Diagnosis not present

## 2021-08-25 DIAGNOSIS — Z7189 Other specified counseling: Secondary | ICD-10-CM | POA: Diagnosis not present

## 2021-08-25 LAB — CBC
HCT: 31.8 % — ABNORMAL LOW (ref 36.0–46.0)
Hemoglobin: 10.1 g/dL — ABNORMAL LOW (ref 12.0–15.0)
MCH: 28.2 pg (ref 26.0–34.0)
MCHC: 31.8 g/dL (ref 30.0–36.0)
MCV: 88.8 fL (ref 80.0–100.0)
Platelets: 215 10*3/uL (ref 150–400)
RBC: 3.58 MIL/uL — ABNORMAL LOW (ref 3.87–5.11)
RDW: 16.4 % — ABNORMAL HIGH (ref 11.5–15.5)
WBC: 9.5 10*3/uL (ref 4.0–10.5)
nRBC: 0 % (ref 0.0–0.2)

## 2021-08-25 LAB — CULTURE, BLOOD (ROUTINE X 2)
Culture: NO GROWTH
Culture: NO GROWTH
Special Requests: ADEQUATE
Special Requests: ADEQUATE

## 2021-08-25 LAB — BASIC METABOLIC PANEL
Anion gap: 12 (ref 5–15)
BUN: 8 mg/dL (ref 8–23)
CO2: 31 mmol/L (ref 22–32)
Calcium: 8.4 mg/dL — ABNORMAL LOW (ref 8.9–10.3)
Chloride: 99 mmol/L (ref 98–111)
Creatinine, Ser: 0.82 mg/dL (ref 0.44–1.00)
GFR, Estimated: >60 mL/min (ref >60)
Glucose, Bld: 133 mg/dL — ABNORMAL HIGH (ref 70–99)
Potassium: 4 mmol/L (ref 3.5–5.1)
Sodium: 142 mmol/L (ref 135–145)

## 2021-08-25 LAB — MAGNESIUM: Magnesium: 2.1 mg/dL (ref 1.7–2.4)

## 2021-08-25 LAB — PHOSPHORUS: Phosphorus: 3.7 mg/dL (ref 2.5–4.6)

## 2021-08-25 MED ORDER — ADULT MULTIVITAMIN W/MINERALS CH
1.0000 | ORAL_TABLET | Freq: Every day | ORAL | Status: DC
  Administered 2021-08-26 – 2021-09-01 (×7): 1 via ORAL
  Filled 2021-08-25 (×7): qty 1

## 2021-08-25 MED ORDER — FLUDROCORTISONE ACETATE 0.1 MG PO TABS
0.1000 mg | ORAL_TABLET | Freq: Every day | ORAL | Status: DC
  Administered 2021-08-25 – 2021-09-01 (×8): 0.1 mg via ORAL
  Filled 2021-08-25 (×8): qty 1

## 2021-08-25 MED ORDER — LACTATED RINGERS IV BOLUS
1000.0000 mL | Freq: Once | INTRAVENOUS | Status: AC
  Administered 2021-08-25: 1000 mL via INTRAVENOUS

## 2021-08-25 MED ORDER — NICOTINE POLACRILEX 2 MG MT GUM: 2.0000 mg | CHEWING_GUM | OROMUCOSAL | Status: DC | PRN

## 2021-08-25 MED ORDER — NICOTINE 21 MG/24HR TD PT24: 21.0000 mg | MEDICATED_PATCH | Freq: Every day | TRANSDERMAL | Status: DC

## 2021-08-25 NOTE — Progress Notes (Signed)
NAME:  Brianna Briggs, MRN:  [id number], DOB:  1950-12-01, LOS: 4 ADMISSION DATE:  08/20/2021, CONSULTATION DATE:  08/21/2021 REFERRING MD:  Hulan Saas, MD, CHIEF COMPLAINT:  Abdominal pain   History of Present Illness:  71 y.o female with significant PMH of Chronic respiratory failure with hypoxia, COVID-19 virus infection, MDD (major depressive disorder), Hyperlipidemia, mixed, Tobacco abuse, Chronic obstructive pulmonary disease (COPD) ([place]), Right Breast, Cancer s/p mastectomy, CAD, PAD, Solitary Kidney, recurrent Diverticulitis w/perforation and abscess s/p partial colectomy with anastomosis and loop ileostomy creation 07/31/21 who presented to the ED with chief complaints of intermittent abdominal pain associated with n/v and generalized weakness.   Patient was recently admitted to the hospital from 7/12-8/1 with epigastric pain. Her prior CT abd/pelvis showed persistent abscess attributed to acute sigmoid diverticulitis. She had initially been treated with percutaneous drainage and IV abx but failed and ultimately underwent sigmoid resection with anastomosis and loop ileostomy creation on 7/16. She was also found to have COVID infection and was treated  with remdesevir and steroids. She was discharged to SNF for short term rehab. Since discharge patient report decreased po intake, intermittent abdominal pain, chills, nausea and vomiting. Staff at the SNF noticed that her bp was very low so sent her to the ED for further evaluation.   ED Course: In the emergency department, the temperature was 37.1C, the heart rate136  beats/minute, the blood 83/60 pressure  mm Hg, the respiratory rate 26  breaths/minute, and the oxygen saturation 95% on 2L.   Pertinent Labs/Diagnostics Findings: Chemistry:Na+/ K+:134/4.8  Glucose: BUN/Cr.:21/1.23.  AST/ALT:44/34 CBC: WBC: 15.5 Other Lab findings:   PCT: 0.16.  Lactic acid: 2.3   Imaging:  CXR> CTA Chest> CT Abd/pelvis>see significant diagnostic tests  below   Patient given 30 cc/kg of fluids and started on broad-spectrum antibiotics Vanco cefepime and Flagyl for sepsis with septic shock due to suspected intraabdominal source. Due to abdominal finding on CT abd/pelvis concerning for abscess/infection, general surgery was consulted who recommended to continue with abx as above and will eval in the am. Patient remained hypotensive despite IVF boluses therefore was started on Levophed. PCCM consulted.  Pertinent  Medical History  Recurrent Diverticulitis with abscess s/p partial colectomy Acute on chronic respiratory failure with hypoxia ([place]) COVID-19 virus infection MDD (major depressive disorder) Hyperlipidemia, mixed Tobacco abuse Chronic obstructive pulmonary disease (COPD) ([place]) Right Breast Cancer s/p mastectomy CAD PAD Solitary Kidney  Significant Diagnostic Tests:  8/4: Chest Xray>No active disease 8/4: CTA abdomen and pelvis>1. Interval loop ileostomy in the right abdomen with small peristomal hernia. No bowel obstruction. 2. Two small right pelvic fluid collections, 2.6 and 2.3 cm largest dimension respectively. Sterility is indeterminate, although the presence of peripheral enhancement suggests abscess/infection. 3. Layering stones or sludge in the gallbladder without CT findings of acute cholecystitis. 8/4: CTA Chest>1. No pulmonary embolus. 2. Right middle lobe collapse with frothy debris in the right middle lobe bronchus, may be mucous plugging or seen in the setting of aspiration. 3. Moderate to advanced emphysema. 4. Scattered pulmonary nodules are stable from prior exam, largest measuring 6 mm in the right upper lobe. Recommend continued annual lung cancer screening CT. 5. Lipomatous hypertrophy of the intra-atrial septum.  Micro Data:  8/4: SARS-CoV-2 PCR> positive on 7/12 8/4: Blood culture x2> 8/4: Urine Culture> 8/4: MRSA PCR>>  8/4: Strep pneumo urinary antigen> 8/4: Legionella urinary antigen>    Antimicrobials:  Vancomycin 8/4> Cefepime 8/4> Metronidazole 8/4>    Significant Hospital Events: Including procedures, antibiotic  start and stop dates in addition to other pertinent events   8/6: Admitted to ICU w/sepsis due to suspected intraabdominal source and possible aspiration pneumonia 8/7: Remains on pressors, being weaned off slowly 8/8 remains on pressors 8/9 remains on pressors, s/p fluid drainage 8/10 REMAINS ON PRESSORS  Interim History / Subjective:  LETHARGIC TODAY LABILE BP'S ON VASOPRESSORS   Objective   Blood pressure (!) 85/63, pulse (!) 106, temperature 98.6 F (37 C), temperature source Oral, resp. rate (!) 29, height '5\' 2"'$  (1.575 m), weight 47.3 kg, SpO2 98 %.        Intake/Output Summary (Last 24 hours) at 08/25/2021 0718 Last data filed at 08/25/2021 0513 Gross per 24 hour  Intake 1240.76 ml  Output 2200 ml  Net -959.24 ml    Filed Weights   08/22/21 0500 08/24/21 0626 08/25/21 0452  Weight: 51 kg 49.5 kg 47.3 kg     REVIEW OF SYSTEMS  PATIENT IS UNABLE TO PROVIDE COMPLETE REVIEW OF SYSTEMS DUE TO LETHARGY   PHYSICAL EXAMINATION:  GENERAL:critically ill appearing,  EYES: Pupils equal, round, reactive to light.  No scleral icterus.  MOUTH: Moist mucosal membrane.  NECK: Supple.  PULMONARY: CTA B/L CARDIOVASCULAR: S1 and S2.  No murmurs  GASTROINTESTINAL: Soft, nontender, -distended. Positive bowel sounds.  MUSCULOSKELETAL: No swelling, clubbing, or edema.  NEUROLOGIC: [facility] Problem list   NA  Assessment & Plan:  71 yo WF with Sepsis with Septic Shock due to Intraabdominal source Recurrent Diverticulitis w/perforation and abscess and possible Aspiration Pneumonia  SEPTIC shock-REMAINS ON PRESSORS Hx of Recurrent Diverticulitis w/perforation and abscess  s/p partial colectomy with anastomosis and loop ileostomy creation 07/31/21 CT/Abd/pelvis showsright pelvis fluid  collections concerning for abscess/infection -use vasopressors to keep MAP>65 as needed -follow ABG and LA as needed -follow up cultures -emperic ABX stress dose steroids -aggressive IV fluid Resuscitation MIDODRINE 10 TID Follow up GEN SURGERY RECS    Acute on Chronic Hypoxic Respiratory Failure due to suspected Aspiration Pneumonia vs mucus plug COPD without evidence of acute exacerbation Wean off oxygen  Hx: Current everyday smoker -Supplemental O2 as needed to maintain O2 saturations 88 to 92%   RENAL -continue Foley Catheter-assess need -Avoid nephrotoxic agents -Follow urine output, BMP -Ensure adequate renal perfusion, optimize oxygenation -Renal dose medications   Intake/Output Summary (Last 24 hours) at 08/25/2021 0721 Last data filed at 08/25/2021 0513 Gross per 24 hour  Intake 1240.76 ml  Output 2200 ml  Net -959.24 ml      Latest Ref Rng & Units 08/25/2021    4:40 AM 08/24/2021    4:26 PM 08/24/2021    2:16 AM  BMP  Glucose 70 - 99 mg/dL 133   142   BUN 8 - 23 mg/dL 8   6   Creatinine 0.44 - 1.00 mg/dL 0.82   0.85   Sodium 135 - 145 mmol/L 142   139   Potassium 3.5 - 5.1 mmol/L 4.0  4.0  3.2   Chloride 98 - 111 mmol/L 99   96   CO2 22 - 32 mmol/L 31   32   Calcium 8.9 - 10.3 mg/dL 8.4   8.3      ENDO - ICU hypoglycemic\Hyperglycemia protocol -check FSBS per protocol   GI GI PROPHYLAXIS as indicated  NUTRITIONAL STATUS DIET-->TF's as tolerated Constipation protocol as indicated   ELECTROLYTES -follow labs as needed -replace as needed -pharmacy consultation and following   Best Practice (  right click and "Reselect all SmartList Selections" daily)   Diet/type: clear liquids DVT prophylaxis: prophylactic heparin  GI prophylaxis: PPI Lines: yes and it is still needed Foley:  Yes, and it is still needed Code Status:  full code   Labs   CBC: Recent Labs  Lab 08/20/21 1708 08/21/21 0413 08/21/21 2030 08/22/21 0318 08/22/21 1200  08/22/21 1815 08/23/21 0333 08/24/21 0216 08/25/21 0440  WBC 15.5*   < > 8.6 10.4  --   --  9.0 7.6 9.5  NEUTROABS 13.4*  --  6.8 8.4*  --   --   --   --   --   HGB 13.5   < > 9.4* 9.2* 8.9* 8.9* 9.0* 11.5* 10.1*  HCT 42.7   < > 28.6* 28.7* 26.8* 26.7* 26.1* 34.4* 31.8*  MCV 88.4   < > 86.9 88.6  --   --  83.4 83.9 88.8  PLT 247   < > 182 187  --   --  158 236 215   < > = values in this interval not displayed.     Basic Metabolic Panel: Recent Labs  Lab 08/22/21 0318 08/23/21 0333 08/23/21 1055 08/24/21 0216 08/24/21 1626 08/25/21 0440  NA 134* 123* 126* 139  --  142  K 4.4 2.8* 3.5 3.2* 4.0 4.0  CL 100 78* 86* 96*  --  99  CO2 27 32 30 32  --  31  GLUCOSE 171* 120* 143* 142*  --  133*  BUN 5* <5* <5* 6*  --  8  CREATININE 0.68 0.59 0.68 0.85  --  0.82  CALCIUM 8.1* 7.7* 8.1* 8.3*  --  8.4*  MG 2.1 1.2* 2.4 2.2  --  2.1  PHOS 3.0 2.4* 2.1* 3.1  --  3.7    GFR: Estimated Creatinine Clearance: 47 mL/min (by C-G formula based on SCr of 0.82 mg/dL). Recent Labs  Lab 08/20/21 1708 08/20/21 1709 08/20/21 2011 08/21/21 0413 08/21/21 1521 08/21/21 2030 08/22/21 0318 08/23/21 0333 08/24/21 0216 08/25/21 0440  PROCALCITON 0.16  --   --   --   --   --   --   --   --   --   WBC 15.5*  --   --    < >  --    < > 10.4 9.0 7.6 9.5  LATICACIDVEN  --  2.3* 1.3  --  1.1  --   --   --   --   --    < > = values in this interval not displayed.     Liver Function Tests: Recent Labs  Lab 08/20/21 1708 08/24/21 0216  AST 44* 21  ALT 34 14  ALKPHOS 114 81  BILITOT 0.6 0.7  PROT 7.7 6.3*  ALBUMIN 3.0* 2.7*    ABG    Component Value Date/Time   HCO3 27.7 08/21/2021 1521   O2SAT 85.6 08/21/2021 1521     CBG: Recent Labs  Lab 08/21/21 0122  GLUCAP 140*     Allergies Allergies  Allergen Reactions   Amoxicillin-Pot Clavulanate Diarrhea and Rash    Other reaction(s): Unknown   Biaxin [Clarithromycin] Diarrhea    Other reaction(s): Unknown   Oxycodone Itching    Codeine Rash   Nitrofurantoin Rash     Home Medications  Prior to Admission medications   Medication Sig Start Date End Date Taking? Authorizing Provider  acidophilus (RISAQUAD) CAPS capsule Take 1 capsule by mouth 3 (three) times daily for 14 days.  08/16/21 08/30/21 Yes Nicole Kindred A, DO  alum & mag hydroxide-simeth (MAALOX/MYLANTA) 200-200-20 MG/5ML suspension Take 15 mLs by mouth every 4 (four) hours as needed for indigestion or heartburn. 08/16/21  Yes Ezekiel Slocumb, DO  Cholecalciferol 25 MCG (1000 UT) capsule Take 1,000 Units by mouth daily. 04/27/21  Yes [provider]  guaiFENesin (MUCINEX) 600 MG 12 hr tablet Take 1 tablet (600 mg total) by mouth 2 (two) times daily. 08/16/21  Yes Nicole Kindred A, DO  guaiFENesin-dextromethorphan (ROBITUSSIN DM) 100-10 MG/5ML syrup Take 10 mLs by mouth every 4 (four) hours as needed for cough. 08/16/21  Yes Ezekiel Slocumb, DO  HYDROcodone-acetaminophen (NORCO) 7.5-325 MG tablet Take 1 tablet by mouth every 4 (four) hours as needed for severe pain. 08/16/21  Yes Nicole Kindred A, DO  methocarbamol (ROBAXIN) 500 MG tablet Take 1 tablet (500 mg total) by mouth every 8 (eight) hours as needed for muscle spasms. 08/16/21  Yes Nicole Kindred A, DO  nicotine (NICODERM CQ - DOSED IN MG/24 HOURS) 21 mg/24hr patch Place 1 patch (21 mg total) onto the skin daily as needed (nicotine craving). 08/16/21  Yes Nicole Kindred A, DO  nystatin (MYCOSTATIN/NYSTOP) powder Apply 1 Application topically 2 (two) times daily as needed. 05/13/21  Yes [provider]  ondansetron (ZOFRAN) 4 MG tablet Take 1 tablet (4 mg total) by mouth every 6 (six) hours as needed for nausea. 08/16/21  Yes Nicole Kindred A, DO  traMADol (ULTRAM) 50 MG tablet Take 1 tablet (50 mg total) by mouth every 6 (six) hours as needed for moderate pain. 08/16/21  Yes Nicole Kindred A, DO  VENTOLIN HFA 108 (90 Base) MCG/ACT inhaler Inhale 2 puffs into the lungs 2 (two) times daily. 04/12/21   Yes [provider]    Scheduled Meds:  budesonide (PULMICORT) nebulizer solution  0.25 mg Nebulization BID   Chlorhexidine Gluconate Cloth  6 each Topical Q0600   enoxaparin (LOVENOX) injection  40 mg Subcutaneous Q24H   hydrocortisone sod succinate (SOLU-CORTEF) inj  100 mg Intravenous BID   ipratropium-albuterol  3 mL Nebulization TID   melatonin  5 mg Oral QHS   midodrine  10 mg Oral TID WC   nicotine  21 mg Transdermal QHS   pantoprazole  40 mg Oral Daily   Continuous Infusions:  sodium chloride     norepinephrine (LEVOPHED) Adult infusion 3 mcg/min (08/25/21 0513)   piperacillin-tazobactam (ZOSYN)  IV 12.5 mL/hr at 08/25/21 0513   PRN Meds:.acetaminophen, atropine, docusate sodium, HYDROcodone-acetaminophen, ipratropium-albuterol, loperamide, morphine injection, ondansetron (ZOFRAN) IV, phenol, polyethylene glycol    DVT/GI PRX  assessed I Assessed the need for Labs I Assessed the need for Foley I Assessed the need for Central Venous Line Family Discussion when available I Assessed the need for Mobilization I made an Assessment of medications to be adjusted accordingly Safety Risk assessment completed  CASE DISCUSSED IN MULTIDISCIPLINARY ROUNDS WITH ICU TEAM    Critical Care Time devoted to patient care services described in this note is 45 minutes.  Critical care was necessary to treat /prevent imminent and life-threatening deterioration.   Corrin Parker, M.D.  [facility]  Medical Director [place] Director [facility] Cardio-Pulmonary Department

## 2021-08-25 NOTE — TOC Initial Note (Signed)
Transition of Care [facility]) - Initial/Assessment Note    Patient Details  Name: Brianna Briggs MRN: [id number] Date of Birth: 1950/09/23  Transition of [facility]) CM/SW Contact:    Shelbie Hutching, RN Phone Number: 08/25/2021, 11:50 AM  Clinical Narrative:                 Patient admitted to the hospital with severe sepsis recently discharged on 8/1 after ex lap with loop ileostomy due to diverticular abscess.  Patient is currently in the ICU hypotensive on pressors, went to IR yesterday for fluid collection drainage in abd.  Patient is from home with her husband.  At discharge on 8/1 from hospital patient went to [facility] for short term rehab. Palliative is following for GOC.   TOC will follow for plan of care/ goals of care.   Expected Discharge Plan: Skilled Nursing Facility Barriers to Discharge: Continued Medical Work up   Patient Goals and CMS Choice   CMS Medicare.gov Compare Post Acute Care list provided to:: Patient Choice offered to / list presented to : Patient, Spouse  Expected Discharge Plan and Services Expected Discharge Plan: [place]   Discharge Planning Services: CM Consult Post Acute Care Choice: [place] Living arrangements for the past 2 months: Single Family Home                 DME Arranged: N/A DME Agency: NA       HH Arranged: NA          Prior Living Arrangements/Services Living arrangements for the past 2 months: Single Family Home Lives with:: Spouse Patient language and need for interpreter reviewed:: Yes        Need for Family Participation in Patient Care: Yes (Comment) Care giver support system in place?: Yes (comment) Current home services: DME Criminal Activity/Legal Involvement Pertinent to Current Situation/Hospitalization: No - Comment as needed  Activities of Daily Living      Permission Sought/Granted                  Emotional Assessment Appearance:: Appears older than  stated age     Orientation: : Oriented to Self, Oriented to Place, Oriented to  Time, Oriented to Situation Alcohol / Substance Use: Not Applicable Psych Involvement: No (comment)  Admission diagnosis:  Intra-abdominal abscess ([place]) [K65.1] Sepsis, due to unspecified organism, unspecified whether acute organ dysfunction present ([place]) [A41.9] Severe sepsis with lactic acidosis ([place]) [A41.9, R65.20, E87.20] Patient Active Problem List   Diagnosis Date Noted   Intra-abdominal abscess ([place])    Severe sepsis with lactic acidosis ([place]) 08/21/2021   Back pain 08/16/2021   Hypokalemia 08/11/2021   Hypophosphatemia 08/11/2021   COVID-19 virus infection 08/05/2021   MDD (major depressive disorder) 08/05/2021   Perforated diverticulum of intestine 07/28/2021   Malnutrition of moderate degree 07/28/2021   Diverticulitis of intestine with abscess 07/27/2021   Abdominal pain 07/27/2021   Acute on chronic respiratory failure with hypoxia ([place])    Hypotension    Colonic diverticular abscess 07/19/2021   Chronic obstructive pulmonary disease (COPD) ([place]) 07/19/2021   Diverticulitis large intestine 06/06/2021   Diverticulitis of large intestine with abscess without bleeding 07/17/2019   Chronic eczema 08/30/2015   Health care maintenance 03/01/2015   Simple chronic bronchitis ([place]) 03/01/2015   Shortness of breath 12/16/2014   Hyperlipidemia, mixed 06/04/2014   Hypercholesterolemia 02/26/2014   PVC (premature ventricular contraction) 09/04/2013   Tobacco abuse 09/04/2013   PCP:  Frazier Richards  Viona Gilmore, MD Pharmacy:   [id number] [id number]: [phone]: [phone]    Social Determinants of Health (SDOH) Interventions    Readmission Risk Interventions    07/28/2021    4:14 PM  Readmission Risk Prevention Plan  Transportation Screening Complete  PCP or Specialist Appt within 5-7 Days Complete  Home  Care Screening Complete  Medication Review (RN CM) Complete

## 2021-08-25 NOTE — Progress Notes (Signed)
Patient ID: Brianna Briggs, female   DOB: Jun 14, 1950, 71 y.o.   MRN: [id number]     [place] Hospital Day(s): 4.   Interval History: Patient seen and examined, no acute events or new complaints overnight. Patient reports continued having chest pain and back pain.  Chest pain aggravated with coughing mostly on the right costal margin.  Denies any nausea or vomiting.  Tolerating diet.  Patient has aspiration of the small fluid collection yesterday.  Cultures in process.  Vital signs in last 24 hours: [min-max] current  Temp:  [98.2 F (36.8 C)-99.8 F (37.7 C)] 98.2 F (36.8 C) (08/10 0800) Pulse Rate:  [43-136] 73 (08/10 1330) Resp:  [14-29] 19 (08/10 1330) BP: (59-150)/(23-102) 100/65 (08/10 1330) SpO2:  [68 %-100 %] 99 % (08/10 1330) Weight:  [47.3 kg] 47.3 kg (08/10 0452)     Height: '5\' 2"'$  (157.5 cm) Weight: 47.3 kg BMI (Calculated): 19.07   Physical Exam:  Constitutional: alert, cooperative and no distress  Respiratory: breathing non-labored at rest  Cardiovascular: regular rate and sinus rhythm  Gastrointestinal: soft, non-tender, and non-distended.  Ileostomy pink and patent.  Wound dry and clean.  Labs:     Latest Ref Rng & Units 08/25/2021    4:40 AM 08/24/2021    2:16 AM 08/23/2021    3:33 AM  CBC  WBC 4.0 - 10.5 K/uL 9.5  7.6  9.0   Hemoglobin 12.0 - 15.0 g/dL 10.1  11.5  9.0   Hematocrit 36.0 - 46.0 % 31.8  34.4  26.1   Platelets 150 - 400 K/uL 215  236  158       Latest Ref Rng & Units 08/25/2021    4:40 AM 08/24/2021    4:26 PM 08/24/2021    2:16 AM  CMP  Glucose 70 - 99 mg/dL 133   142   BUN 8 - 23 mg/dL 8   6   Creatinine 0.44 - 1.00 mg/dL 0.82   0.85   Sodium 135 - 145 mmol/L 142   139   Potassium 3.5 - 5.1 mmol/L 4.0  4.0  3.2   Chloride 98 - 111 mmol/L 99   96   CO2 22 - 32 mmol/L 31   32   Calcium 8.9 - 10.3 mg/dL 8.4   8.3   Total Protein 6.5 - 8.1 g/dL   6.3   Total Bilirubin 0.3 - 1.2 mg/dL   0.7   Alkaline Phos 38 - 126 U/L   81    AST 15 - 41 U/L   21   ALT 0 - 44 U/L   14     Imaging studies: No new pertinent imaging studies   Assessment/Plan:  71 y.o. female with sepsis   Possible etiologies are small intra-abdominal abscess versus pneumonia/COPD exacerbation   -White blood cell continue within normal limits.  No fever.  Stable hemoglobin. -Levophed right now at 2.8.  Blood pressure with MAP of 76 -Small fluid collection aspirated yesterday.  Cultures in process. -Patient tolerating regular diet. -No change in her clinical status -Continue medical management as per ICU team. -I encouraged the patient to ambulate. -I will continue to follow closely.  Arnold Long, MD

## 2021-08-25 NOTE — Consult Note (Signed)
PHARMACY CONSULT NOTE  Pharmacy Consult for Electrolyte Monitoring and Replacement   Recent Labs: Potassium (mmol/L)  Date Value  08/25/2021 4.0   Magnesium (mg/dL)  Date Value  08/25/2021 2.1   Calcium (mg/dL)  Date Value  08/25/2021 8.4 (L)   Albumin (g/dL)  Date Value  08/24/2021 2.7 (L)   Phosphorus (mg/dL)  Date Value  08/25/2021 3.7   Sodium (mmol/L)  Date Value  08/25/2021 142   Assessment: 71 y.o female with significant PMH of chronic respiratory failure with hypoxia, MDD, HLD, tobacco abuse, COPD, right breast cancer s/p mastectomy, CAD, PAD, solitary kidney, recurrent diverticulitis w/ perforation and abscess s/p partial colectomy with anastomosis and loop ileostomy creation 07/31/21 who presented to the ED with chief complaints of intermittent abdominal pain associated with n/v and generalized weakness.  Goal of Therapy:  Within normal limits  Plan:  --No electrolyte replacement indicated at this time --Follow-up electrolytes with AM labs tomorrow  Brianna Briggs  08/25/2021 7:48 AM

## 2021-08-25 NOTE — Progress Notes (Signed)
Palliative: Mrs. Doreene Burke, is resting quietly in bed.  She appears acutely/chronically ill and quite frail.  She is alert and oriented, able to make her basic needs known.  Her husband of 30+ years, [name]/[name], is present at bedside.  We talk about Mrs. Swaminathan's aspiration of abdomen yesterday.  She tells me that she tolerated this well.  Her husband states that they have not received results yet.  I share that this type of testing usually takes anywhere from 3 days before results of are available.  They state understanding.  We talk about her blood pressure and vasopressor needs.  [name] talks about Channon's increased appetite, sharing that she ate well last night.  He shares that they are encouraged.    Conference with CCM NP, bedside nursing staff, transition of care team related to patient condition, needs, goals of care, disposition.  Plan:   At this point continue full scope/full code.  Time for outcomes.  PMT to shadow for declines.  63 minutes Quinn Axe, NP Palliative medicine team Team phone [phone] Greater than 50% of this time was spent counseling and coordinating care related to the above assessment and plan.

## 2021-08-25 NOTE — Plan of Care (Signed)
BP improved this afternoon. Has weaned off Levo as of this time. Also has received 1 liter LR and also receiving midodrine with meals. Has OOB to [facility] and tolerated well, but weak. Eating and drinking well. Alert and oriented. O2 on at 2 Liters, chronic. Telemetry shows SB , NSR, and ST. Heart rate varies greatly, in 50's as high as 120's with lower BP's. Husband intermittently at bedside. Decline nicotene patch and gum due to sensitive skin and missing teeth.   Problem: Fluid Volume: Goal: Hemodynamic stability will improve Outcome: Progressing   Problem: Clinical Measurements: Goal: Diagnostic test results will improve Outcome: Progressing Goal: Signs and symptoms of infection will decrease Outcome: Progressing   Problem: Respiratory: Goal: Ability to maintain adequate ventilation will improve Outcome: Progressing   Problem: Education: Goal: Knowledge of General Education information will improve Description: Including pain rating scale, medication(s)/side effects and non-pharmacologic comfort measures Outcome: Progressing   Problem: Health Behavior/Discharge Planning: Goal: Ability to manage health-related needs will improve Outcome: Progressing   Problem: Clinical Measurements: Goal: Ability to maintain clinical measurements within normal limits will improve Outcome: Progressing Goal: Will remain free from infection Outcome: Progressing Goal: Diagnostic test results will improve Outcome: Progressing Goal: Respiratory complications will improve Outcome: Progressing Goal: Cardiovascular complication will be avoided Outcome: Progressing   Problem: Activity: Goal: Risk for activity intolerance will decrease Outcome: Progressing   Problem: Nutrition: Goal: Adequate nutrition will be maintained Outcome: Progressing   Problem: Coping: Goal: Level of anxiety will decrease Outcome: Progressing   Problem: Elimination: Goal: Will not experience complications related to bowel  motility Outcome: Progressing Goal: Will not experience complications related to urinary retention Outcome: Progressing   Problem: Pain Managment: Goal: General experience of comfort will improve Outcome: Progressing   Problem: Safety: Goal: Ability to remain free from injury will improve Outcome: Progressing   Problem: Skin Integrity: Goal: Risk for impaired skin integrity will decrease Outcome: Progressing

## 2021-08-26 LAB — BASIC METABOLIC PANEL
Anion gap: 8 (ref 5–15)
BUN: 14 mg/dL (ref 8–23)
CO2: 33 mmol/L — ABNORMAL HIGH (ref 22–32)
Calcium: 8.6 mg/dL — ABNORMAL LOW (ref 8.9–10.3)
Chloride: 100 mmol/L (ref 98–111)
Creatinine, Ser: 0.79 mg/dL (ref 0.44–1.00)
GFR, Estimated: >60 mL/min (ref >60)
Glucose, Bld: 125 mg/dL — ABNORMAL HIGH (ref 70–99)
Potassium: 3.5 mmol/L (ref 3.5–5.1)
Sodium: 141 mmol/L (ref 135–145)

## 2021-08-26 LAB — CBC
HCT: 30.9 % — ABNORMAL LOW (ref 36.0–46.0)
Hemoglobin: 9.5 g/dL — ABNORMAL LOW (ref 12.0–15.0)
MCH: 28 pg (ref 26.0–34.0)
MCHC: 30.7 g/dL (ref 30.0–36.0)
MCV: 91.2 fL (ref 80.0–100.0)
Platelets: 175 10*3/uL (ref 150–400)
RBC: 3.39 MIL/uL — ABNORMAL LOW (ref 3.87–5.11)
RDW: 16.7 % — ABNORMAL HIGH (ref 11.5–15.5)
WBC: 6.8 10*3/uL (ref 4.0–10.5)
nRBC: 0 % (ref 0.0–0.2)

## 2021-08-26 MED ORDER — NICOTINE 10 MG IN INHA
1.0000 | RESPIRATORY_TRACT | Status: DC | PRN
  Filled 2021-08-26: qty 33

## 2021-08-26 MED ORDER — DEXTROSE IN LACTATED RINGERS 5 % IV SOLN
INTRAVENOUS | Status: AC
Start: 2021-08-26 — End: 2021-08-27

## 2021-08-26 MED ORDER — IPRATROPIUM-ALBUTEROL 0.5-2.5 (3) MG/3ML IN SOLN
3.0000 mL | Freq: Four times a day (QID) | RESPIRATORY_TRACT | Status: DC
  Administered 2021-08-26 – 2021-08-27 (×3): 3 mL via RESPIRATORY_TRACT
  Filled 2021-08-26 (×3): qty 3

## 2021-08-26 MED ORDER — POTASSIUM CHLORIDE CRYS ER 20 MEQ PO TBCR
20.0000 meq | EXTENDED_RELEASE_TABLET | Freq: Once | ORAL | Status: AC
Start: 2021-08-26 — End: 2021-08-26
  Administered 2021-08-26: 20 meq via ORAL
  Filled 2021-08-26: qty 1

## 2021-08-26 MED ORDER — NOREPINEPHRINE 4 MG/250ML-% IV SOLN: 2.0000 ug/min | INTRAVENOUS | Status: DC

## 2021-08-26 MED ORDER — HYDROCORTISONE SOD SUC (PF) 100 MG IJ SOLR
50.0000 mg | Freq: Two times a day (BID) | INTRAMUSCULAR | Status: DC
Start: 2021-08-26 — End: 2021-08-28
  Administered 2021-08-26 – 2021-08-28 (×5): 50 mg via INTRAVENOUS
  Filled 2021-08-26 (×4): qty 1

## 2021-08-26 NOTE — Progress Notes (Signed)
NAME:  Brianna Briggs, MRN:  [id number], DOB:  [birth year], LOS: 5 ADMISSION DATE:  08/20/2021  History of Present Illness:  This patient is a 71 year old female with a history of multiple medical problems most notable for CAD, COPD (05/2020 PFTs show FEV1 of 0.8 L / 39% predicted, FEV1/FVC 35%, DLCO at 50% predicted) who was recently admitted in July 2023 with abdominal pain and found to have acute diverticulitis complicated by perforation and abscess formation.  She failed IR drainage and required partial colectomy with anastomosis and loop ileostomy creation.  That hospitalization was complicated by acute on chronic respiratory failure due to [id number] complicating her previously known COPD.  The patient was discharged to rehab on 8/1 and required rehospitalization on 8/5 whereby she presented to the ER with hypotension.  Her presentation was concerning for septic shock versus hypovolemic shock and repeat imaging was notable for 2 small fluid collections in the abdomen felt to be potential infectious sources.  She is now status post CT-guided aspiration on 8/10 as well as management of her shock with a combination of antibiotics, IV fluids, stress dose steroids, and vasoactive medications.  She is in the intensive care unit due to prolonged need for norepinephrine infusion.  Pertinent  Medical History  Diverticulitis with abscess status post partial colectomy History of MDD Tobacco abuse COPD CAD PAD Hyperlipidemia History of COVID-19 virus infection  Significant Hospital Events: Including procedures, antibiotic start and stop dates in addition to other pertinent events   8/6: Admitted to ICU w/sepsis due to suspected intraabdominal source and possible aspiration pneumonia 8/7 on pressors, being weaned off slowly 8/8 on pressors, s/p fluid drainage 8/10 REMAINS ON PRESSORS 8/11 weaned off vasopressors overnight  Interim History / Subjective:  She reports feeling weak but does endorse  endorse a slightly improved appetite.  She was able to eat the food that her husband brought in overnight.  She feels weak but was able to move to the commode.  She denies any abdominal pain, chest pain, shortness of breath, lower extremity swelling or pain.  She is alert and oriented to place person and time.  Objective   Blood pressure (!) 114/59, pulse (!) 50, temperature 98 F (36.7 C), temperature source Oral, resp. rate 13, height '5\' 2"'$  (1.575 m), weight 50.3 kg, SpO2 98 %.        Intake/Output Summary (Last 24 hours) at 08/26/2021 0815 Last data filed at 08/26/2021 0400 Gross per 24 hour  Intake 2323.79 ml  Output 1250 ml  Net 1073.79 ml   Filed Weights   08/24/21 0626 08/25/21 0452 08/26/21 0600  Weight: 49.5 kg 47.3 kg 50.3 kg    Examination: General: thin and ill appearing HENT: pupils equal, round, and reactive Lungs: Good bilateral air entry. No rhonchi or wheezing noted. Bilateral basal crackles heard on posterior auscultation Cardiovascular: Regular rate and rhythm. Normal S1 and S2. No murmurs, rubs or gallops appreciated. Abdomen: Soft and non-tender with positive bowel sounds. Ileostomy appears intact and non-erythematous. There is an ileostomy bag over the ileostomy that is well positioned.  Extremities: Warm and well perfused. No lower extremity edema. Neuro: Awake, alert and oriented. No gross motor weakness or sensory deficits. Skin: warm and dry.  Resolved Hospital Problem list   N/A  Assessment & Plan:   Brianna Briggs is a 71 year old female with a history of sigmoid diverticulitis status post partial colectomy and loop ileostomy creation with hospital course complicated by [id number] who was readmitted 4 days following discharge  with hypotension concerning for septic versus hypovolemic shock.  #septic shock #diverticular abscess s/p drainage  The patient has a history of diverticulitis that failed IR guided therapy for abscess drainage requiring partial  colectomy.  On readmission, repeat CT scan of her abdomen did show 2 small fluid collections with a differential of seroma versus retained abscesses.  The patient was noted to be hypotensive with mild elevation in her lactic acid concerning for impaired perfusion and prompting nor-epi initiation and admission to the ICU.  Given concern for infection, she was covered broadly with antibiotics and started on vasopressors.  Other considerations for her shocked state include hypovolemia given her poor nutritional status/PO intake as well as possible adrenal insufficiency (appears to have been on 5 mg of prednisone for the past 3-4 months).  In terms of management, the abscesses were drained with CT guidance and fluid was sent to culture and is currently pending.  She was also started on IV hydrocortisone for stress dosing and her volume status was repleted.  Overnight, she was weaned off of norepinephrine.  -Goal SBP of > 90 mmHg -Follow-up fluid collection cultures -antibiotics for 7 days, end date on the personal/tazobactam on 8/12 (inclusive) -Continue IV hydrocortisone, decrease dose to 50 mg IV twice daily -Continue fludrocortisone 0.1 mg daily -Will DC central line if hemodynamics remain stable over the next 12 hours -follow up result of TTE -consider gentle IV fluids depending on PO intake  #Chronic hypoxic respiratory failure #COPD (GOLD IV-E)  She does have a history of COPD with FEV1 of 39% predicted and an emphysema predominant pattern on CT chest.  During her most recent admission, she acquired COVID-19 resulting in acute on chronic hypoxic respiratory failure.  She also appears to be on chronic prednisone on an outpatient basis.  -Oxygen supplementation via nasal cannula with a goal saturation of 88 to 92% -Continue DuoNebs, change frequency to 4 times daily while awake -Continue with nebulized budesonide, given her advanced COPD she would benefit from triple therapy on  discharge  #Acute Kidney Injury - resolved  Admission, her creatinine had up trended to 1.23 but with volume resuscitation and pressure support this has improved, creatinine is currently at baseline.  -Monitor and replete electrolytes as needed  Bundle: Neuro - AAOx3, morphine for pain PRN Pulmonary - COPD, GOLD IV-E, Albuterol/ipratropium/budesonide, SpO2 goal 88-92% Cardiovascular - Septic shock, hypovolemia, and adrenal insufficiency. Hydrocortisone, fludrocortisone, and follow TTE. SBP goal of 90. D/c CVC GI - diet as tolerated, nutrition consult, d/c PPI Renal - AKI resolved, lytes PRN Endo - hydrocortisone to 50 bid, continue fludrocortisone, TSH & T4 within normal Hem/Onc - DVT prophy with lovenox, monitor CBC for anemia ID - IV antibiotics, zosyn for total of 7 days MSK - PT/OT   Best Practice (right click and "Reselect all SmartList Selections" daily)   Diet/type: Regular consistency (see orders) DVT prophylaxis: LMWH GI prophylaxis: on PPI, will discontinue Lines: No longer needed.  Order written to d/c  Foley:  N/A Code Status:  full code Last date of multidisciplinary goals of care discussion [08/26/2021]  Labs   CBC: Recent Labs  Lab 08/20/21 1708 08/21/21 0413 08/21/21 2030 08/22/21 0318 08/22/21 1200 08/22/21 1815 08/23/21 0333 08/24/21 0216 08/25/21 0440 08/26/21 0550  WBC 15.5*   < > 8.6 10.4  --   --  9.0 7.6 9.5 6.8  NEUTROABS 13.4*  --  6.8 8.4*  --   --   --   --   --   --  HGB 13.5   < > 9.4* 9.2*   < > 8.9* 9.0* 11.5* 10.1* 9.5*  HCT 42.7   < > 28.6* 28.7*   < > 26.7* 26.1* 34.4* 31.8* 30.9*  MCV 88.4   < > 86.9 88.6  --   --  83.4 83.9 88.8 91.2  PLT 247   < > 182 187  --   --  158 236 215 175   < > = values in this interval not displayed.    Basic Metabolic Panel: Recent Labs  Lab 08/22/21 0318 08/23/21 0333 08/23/21 1055 08/24/21 0216 08/24/21 1626 08/25/21 0440 08/26/21 0550  NA 134* 123* 126* 139  --  142 141  K 4.4 2.8* 3.5  3.2* 4.0 4.0 3.5  CL 100 78* 86* 96*  --  99 100  CO2 27 32 30 32  --  31 33*  GLUCOSE 171* 120* 143* 142*  --  133* 125*  BUN 5* <5* <5* 6*  --  8 14  CREATININE 0.68 0.59 0.68 0.85  --  0.82 0.79  CALCIUM 8.1* 7.7* 8.1* 8.3*  --  8.4* 8.6*  MG 2.1 1.2* 2.4 2.2  --  2.1  --   PHOS 3.0 2.4* 2.1* 3.1  --  3.7  --    GFR: Estimated Creatinine Clearance: 51 mL/min (by C-G formula based on SCr of 0.79 mg/dL). Recent Labs  Lab 08/20/21 1708 08/20/21 1709 08/20/21 2011 08/21/21 0413 08/21/21 1521 08/21/21 2030 08/23/21 0333 08/24/21 0216 08/25/21 0440 08/26/21 0550  PROCALCITON 0.16  --   --   --   --   --   --   --   --   --   WBC 15.5*  --   --    < >  --    < > 9.0 7.6 9.5 6.8  LATICACIDVEN  --  2.3* 1.3  --  1.1  --   --   --   --   --    < > = values in this interval not displayed.    Liver Function Tests: Recent Labs  Lab 08/20/21 1708 08/24/21 0216  AST 44* 21  ALT 34 14  ALKPHOS 114 81  BILITOT 0.6 0.7  PROT 7.7 6.3*  ALBUMIN 3.0* 2.7*   No results for input(s): "LIPASE", "AMYLASE" in the last 168 hours. No results for input(s): "AMMONIA" in the last 168 hours.  ABG    Component Value Date/Time   HCO3 27.7 08/21/2021 1521   O2SAT 85.6 08/21/2021 1521     Coagulation Profile: Recent Labs  Lab 08/20/21 1708  INR 1.1    Cardiac Enzymes: No results for input(s): "CKTOTAL", "CKMB", "CKMBINDEX", "TROPONINI" in the last 168 hours.  HbA1C: No results found for: "HGBA1C"  CBG: Recent Labs  Lab 08/21/21 0122  GLUCAP 140*    Review of Systems:   Review of Systems  Constitutional:  Positive for malaise/fatigue and weight loss. Negative for chills and fever.  Respiratory:  Negative for cough and sputum production.   Cardiovascular:  Negative for chest pain.  Gastrointestinal:  Negative for abdominal pain, nausea and vomiting.  Genitourinary:  Negative for dysuria.  Skin:  Positive for itching (around the ileostomy site).  Neurological:  Positive  for dizziness. Negative for headaches.     Past Medical History:  She,  has a past medical history of Allergic genetic state, Breast cancer, right ([place]) (1996), History of chicken pox, History of colon polyps, Hyperlipidemia, Single kidney, and Squamous  cell carcinoma of skin (01/27/2019).   Surgical History:   Past Surgical History:  Procedure Laterality Date   ABDOMINAL HYSTERECTOMY     COLONOSCOPY WITH PROPOFOL N/A 05/01/2019   Procedure: COLONOSCOPY WITH PROPOFOL;  Surgeon: Toledo, Benay Pike, MD;  Location: [facility] ENDOSCOPY;  Service: Gastroenterology;  Laterality: N/A;   ESOPHAGOGASTRODUODENOSCOPY (EGD) WITH PROPOFOL N/A 05/01/2019   Procedure: ESOPHAGOGASTRODUODENOSCOPY (EGD) WITH PROPOFOL;  Surgeon: Toledo, Benay Pike, MD;  Location: [facility] ENDOSCOPY;  Service: Gastroenterology;  Laterality: N/A;   IR CATHETER TUBE CHANGE  06/29/2021   IR RADIOLOGIST EVAL & MGMT  06/23/2021   IR RADIOLOGIST EVAL & MGMT  07/14/2021   LAPAROTOMY N/A 07/31/2021   Procedure: EXPLORATORY LAPAROTOMY;  Surgeon: Herbert Pun, MD;  Location: [facility] ORS;  Service: General;  Laterality: N/A;   MASTECTOMY     PARTIAL COLECTOMY N/A 07/31/2021   Procedure: Oliver Hum ILEOSTOMY;  Surgeon: Herbert Pun, MD;  Location: [facility] ORS;  Service: General;  Laterality: N/A;   SHOULDER ARTHROSCOPY WITH SUBACROMIAL DECOMPRESSION, ROTATOR CUFF REPAIR AND BICEP TENDON REPAIR Left 01/13/2008   TONSILLECTOMY     TUBAL LIGATION       Social History:   reports that she has been smoking cigarettes. She has a 60.00 pack-year smoking history. She has never used smokeless tobacco. She reports that she does not drink alcohol and does not use drugs.   Family History:  Her family history includes Cancer in her father; Kidney failure in her mother.   Allergies Allergies  Allergen Reactions   Amoxicillin-Pot Clavulanate Diarrhea and Rash    Other reaction(s): Unknown   Biaxin [Clarithromycin] Diarrhea    Other reaction(s):  Unknown   Oxycodone Itching   Codeine Rash   Nitrofurantoin Rash     Home Medications  Prior to Admission medications   Medication Sig Start Date End Date Taking? Authorizing Provider  acidophilus (RISAQUAD) CAPS capsule Take 1 capsule by mouth 3 (three) times daily for 14 days. 08/16/21 08/30/21 Yes Nicole Kindred A, DO  alum & mag hydroxide-simeth (MAALOX/MYLANTA) 200-200-20 MG/5ML suspension Take 15 mLs by mouth every 4 (four) hours as needed for indigestion or heartburn. 08/16/21  Yes Ezekiel Slocumb, DO  Cholecalciferol 25 MCG (1000 UT) capsule Take 1,000 Units by mouth daily. 04/27/21  Yes [provider]  guaiFENesin (MUCINEX) 600 MG 12 hr tablet Take 1 tablet (600 mg total) by mouth 2 (two) times daily. 08/16/21  Yes Nicole Kindred A, DO  guaiFENesin-dextromethorphan (ROBITUSSIN DM) 100-10 MG/5ML syrup Take 10 mLs by mouth every 4 (four) hours as needed for cough. 08/16/21  Yes Ezekiel Slocumb, DO  HYDROcodone-acetaminophen (NORCO) 7.5-325 MG tablet Take 1 tablet by mouth every 4 (four) hours as needed for severe pain. 08/16/21  Yes Nicole Kindred A, DO  methocarbamol (ROBAXIN) 500 MG tablet Take 1 tablet (500 mg total) by mouth every 8 (eight) hours as needed for muscle spasms. 08/16/21  Yes Nicole Kindred A, DO  nicotine (NICODERM CQ - DOSED IN MG/24 HOURS) 21 mg/24hr patch Place 1 patch (21 mg total) onto the skin daily as needed (nicotine craving). 08/16/21  Yes Nicole Kindred A, DO  nystatin (MYCOSTATIN/NYSTOP) powder Apply 1 Application topically 2 (two) times daily as needed. 05/13/21  Yes [provider]  ondansetron (ZOFRAN) 4 MG tablet Take 1 tablet (4 mg total) by mouth every 6 (six) hours as needed for nausea. 08/16/21  Yes Nicole Kindred A, DO  traMADol (ULTRAM) 50 MG tablet Take 1 tablet (50 mg total) by mouth every  6 (six) hours as needed for moderate pain. 08/16/21  Yes Nicole Kindred A, DO  VENTOLIN HFA 108 (90 Base) MCG/ACT inhaler Inhale 2 puffs into the lungs 2  (two) times daily. 04/12/21  Yes [provider]     Critical care time: 35 minutes

## 2021-08-26 NOTE — Plan of Care (Signed)
Off norepinephrine; on Midodrine 10 mg tid. Maintaining SBP above 90. SB and NSR on monitor. IVF at 75 cc per hour. Eats poorly, approx 25% of meals. Declines nutrition supplements due to taste. Drinking fair. Femoral central line now out. PIV #22 placed by IV team with ultrasound today. PT consult has been placed. Pt does verbalize feeling sad, discouraged and depressed. Up to [facility] to void x 1 so far today. Pt agrees to sit up in chair for evening meal.   Problem: Fluid Volume: Goal: Hemodynamic stability will improve Outcome: Progressing   Problem: Clinical Measurements: Goal: Diagnostic test results will improve Outcome: Progressing Goal: Signs and symptoms of infection will decrease Outcome: Progressing   Problem: Respiratory: Goal: Ability to maintain adequate ventilation will improve Outcome: Progressing   Problem: Education: Goal: Knowledge of General Education information will improve Description: Including pain rating scale, medication(s)/side effects and non-pharmacologic comfort measures Outcome: Progressing   Problem: Health Behavior/Discharge Planning: Goal: Ability to manage health-related needs will improve Outcome: Progressing   Problem: Clinical Measurements: Goal: Ability to maintain clinical measurements within normal limits will improve Outcome: Progressing Goal: Will remain free from infection Outcome: Progressing Goal: Diagnostic test results will improve Outcome: Progressing Goal: Respiratory complications will improve Outcome: Progressing Goal: Cardiovascular complication will be avoided Outcome: Progressing   Problem: Activity: Goal: Risk for activity intolerance will decrease Outcome: Progressing   Problem: Nutrition: Goal: Adequate nutrition will be maintained Outcome: Progressing   Problem: Coping: Goal: Level of anxiety will decrease Outcome: Progressing   Problem: Elimination: Goal: Will not experience complications related to bowel  motility Outcome: Progressing Goal: Will not experience complications related to urinary retention Outcome: Progressing   Problem: Pain Managment: Goal: General experience of comfort will improve Outcome: Progressing   Problem: Safety: Goal: Ability to remain free from injury will improve Outcome: Progressing   Problem: Skin Integrity: Goal: Risk for impaired skin integrity will decrease Outcome: Progressing

## 2021-08-26 NOTE — Progress Notes (Signed)
Patient ID: Geet Hosking, female   DOB: 07-Sep-1950, 71 y.o.   MRN: [id number]     [facility] Day(s): 5.   Interval History: Patient seen and examined, no acute events or new complaints overnight. Patient reports better.  She does endorses feeling weak and "wobbly".  Endorses tolerating diet.  Denies nausea or vomiting.  Vital signs in last 24 hours: [min-max] current  Temp:  [97.8 F (36.6 C)-98.6 F (37 C)] 97.8 F (36.6 C) (08/11 1200) Pulse Rate:  [39-94] 49 (08/11 1430) Resp:  [9-28] 9 (08/11 1430) BP: (72-130)/(46-77) 117/60 (08/11 1430) SpO2:  [93 %-100 %] 99 % (08/11 1430) Weight:  [50.3 kg] 50.3 kg (08/11 0600)     Height: '5\' 2"'$  (157.5 cm) Weight: 50.3 kg BMI (Calculated): 20.28   Physical Exam:  Constitutional: alert, cooperative and no distress  Respiratory: breathing non-labored at rest  Cardiovascular: regular rate and sinus rhythm  Gastrointestinal: soft, non-tender, and non-distended.  Ileostomy pink and patent  Labs:     Latest Ref Rng & Units 08/26/2021    5:50 AM 08/25/2021    4:40 AM 08/24/2021    2:16 AM  CBC  WBC 4.0 - 10.5 K/uL 6.8  9.5  7.6   Hemoglobin 12.0 - 15.0 g/dL 9.5  10.1  11.5   Hematocrit 36.0 - 46.0 % 30.9  31.8  34.4   Platelets 150 - 400 K/uL 175  215  236       Latest Ref Rng & Units 08/26/2021    5:50 AM 08/25/2021    4:40 AM 08/24/2021    4:26 PM  CMP  Glucose 70 - 99 mg/dL 125  133    BUN 8 - 23 mg/dL 14  8    Creatinine 0.44 - 1.00 mg/dL 0.79  0.82    Sodium 135 - 145 mmol/L 141  142    Potassium 3.5 - 5.1 mmol/L 3.5  4.0  4.0   Chloride 98 - 111 mmol/L 100  99    CO2 22 - 32 mmol/L 33  31    Calcium 8.9 - 10.3 mg/dL 8.6  8.4      Imaging studies: No new pertinent imaging studies   Assessment/Plan:  71 y.o. female with sepsis   Possible etiologies are small intra-abdominal abscess versus pneumonia/COPD exacerbation  -Doing better.  Today out of pressors. Tolerating diet -Agreed to continue  medical management of sepsis -We will continue to follow.  Arnold Long, MD

## 2021-08-26 NOTE — Consult Note (Signed)
PHARMACY CONSULT NOTE  Pharmacy Consult for Electrolyte Monitoring and Replacement   Recent Labs: Potassium (mmol/L)  Date Value  08/26/2021 3.5   Magnesium (mg/dL)  Date Value  08/25/2021 2.1   Calcium (mg/dL)  Date Value  08/26/2021 8.6 (L)   Albumin (g/dL)  Date Value  08/24/2021 2.7 (L)   Phosphorus (mg/dL)  Date Value  08/25/2021 3.7   Sodium (mmol/L)  Date Value  08/26/2021 141   Assessment: 71 y.o female with significant PMH of chronic respiratory failure with hypoxia, MDD, HLD, tobacco abuse, COPD, right breast cancer s/p mastectomy, CAD, PAD, solitary kidney, recurrent diverticulitis w/ perforation and abscess s/p partial colectomy with anastomosis and loop ileostomy creation 07/31/21 who presented to the ED with chief complaints of intermittent abdominal pain associated with n/v and generalized weakness.  Goal of Therapy:  Within normal limits  Plan:  --No electrolyte replacement indicated at this time --Follow-up electrolytes with AM labs tomorrow  Brianna Briggs  08/26/2021 7:48 AM

## 2021-08-27 DIAGNOSIS — K651 Peritoneal abscess: Secondary | ICD-10-CM | POA: Diagnosis not present

## 2021-08-27 DIAGNOSIS — E876 Hypokalemia: Secondary | ICD-10-CM

## 2021-08-27 LAB — BASIC METABOLIC PANEL
Anion gap: 8 (ref 5–15)
BUN: 12 mg/dL (ref 8–23)
CO2: 30 mmol/L (ref 22–32)
Calcium: 8.5 mg/dL — ABNORMAL LOW (ref 8.9–10.3)
Chloride: 102 mmol/L (ref 98–111)
Creatinine, Ser: 0.69 mg/dL (ref 0.44–1.00)
GFR, Estimated: >60 mL/min (ref >60)
Glucose, Bld: 121 mg/dL — ABNORMAL HIGH (ref 70–99)
Potassium: 3.3 mmol/L — ABNORMAL LOW (ref 3.5–5.1)
Sodium: 140 mmol/L (ref 135–145)

## 2021-08-27 LAB — CBC
HCT: 30.5 % — ABNORMAL LOW (ref 36.0–46.0)
Hemoglobin: 9.6 g/dL — ABNORMAL LOW (ref 12.0–15.0)
MCH: 28.6 pg (ref 26.0–34.0)
MCHC: 31.5 g/dL (ref 30.0–36.0)
MCV: 90.8 fL (ref 80.0–100.0)
Platelets: 168 10*3/uL (ref 150–400)
RBC: 3.36 MIL/uL — ABNORMAL LOW (ref 3.87–5.11)
RDW: 16.6 % — ABNORMAL HIGH (ref 11.5–15.5)
WBC: 5.8 10*3/uL (ref 4.0–10.5)
nRBC: 0 % (ref 0.0–0.2)

## 2021-08-27 LAB — PHOSPHORUS: Phosphorus: 2.6 mg/dL (ref 2.5–4.6)

## 2021-08-27 LAB — MAGNESIUM: Magnesium: 1.5 mg/dL — ABNORMAL LOW (ref 1.7–2.4)

## 2021-08-27 MED ORDER — POTASSIUM CHLORIDE 10 MEQ/100ML IV SOLN
10.0000 meq | INTRAVENOUS | Status: DC
  Filled 2021-08-27 (×4): qty 100

## 2021-08-27 MED ORDER — MAGNESIUM SULFATE 4 GM/100ML IV SOLN
4.0000 g | Freq: Once | INTRAVENOUS | Status: AC
  Administered 2021-08-27: 4 g via INTRAVENOUS
  Filled 2021-08-27: qty 100

## 2021-08-27 MED ORDER — POTASSIUM CHLORIDE CRYS ER 20 MEQ PO TBCR
40.0000 meq | EXTENDED_RELEASE_TABLET | Freq: Once | ORAL | Status: AC
  Administered 2021-08-27: 40 meq via ORAL
  Filled 2021-08-27: qty 2

## 2021-08-27 MED ORDER — IPRATROPIUM-ALBUTEROL 0.5-2.5 (3) MG/3ML IN SOLN
3.0000 mL | Freq: Three times a day (TID) | RESPIRATORY_TRACT | Status: DC
  Administered 2021-08-27 – 2021-08-29 (×6): 3 mL via RESPIRATORY_TRACT
  Filled 2021-08-27 (×6): qty 3

## 2021-08-27 MED ORDER — POTASSIUM CHLORIDE CRYS ER 20 MEQ PO TBCR
40.0000 meq | EXTENDED_RELEASE_TABLET | Freq: Once | ORAL | Status: AC
Start: 2021-08-27 — End: 2021-08-27
  Administered 2021-08-27: 40 meq via ORAL
  Filled 2021-08-27: qty 2

## 2021-08-27 NOTE — Progress Notes (Addendum)
PROGRESS NOTE    Brianna Briggs  IWO:[id number] DOB: 1950-01-30 DOA: 08/20/2021 PCP: Kirk Ruths, MD   Assessment & Plan:   Principal Problem:   Severe sepsis with lactic acidosis [facility]) Active Problems:   Intra-abdominal abscess (HCC)  Assessment and Plan: Septic shock: please Dr. Gretta Began note on how pt met septic shock criteria. Likely secondary to diverticular abscess s/p drainage. Continue on IV zosyn x 7 days. Continue on stress dose steroids. Continue on florinef  Diverticular abscess: s/p drainage. Continue on IV zosyn x 7 days. 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  Continue on supplemental oxygen at night  COPD: continue on bronchodilators, encourage incentive spirometry   AKI: resolved  Hypokalemia: potassium ordered  Hypomagnesemia: mg sulfate ordered    DVT prophylaxis: lovenox  Code Status: full  Family Communication: discussed pt's care w/ pt's family at bedside and answered their questions  Disposition Plan: PT/OT recs SNF   Level of care: Stepdown  Status is: Inpatient Remains inpatient appropriate because: severity of illness    Consultants:  ICU General surg   Procedures:   Antimicrobials: zosyn    Subjective: Pt c/o fatigue   Objective: Vitals:   08/27/21 0100 08/27/21 0200 08/27/21 0300 08/27/21 0705  BP: 112/69 112/70 (!) 106/58   Pulse: 70 70 64   Resp: '13 15 14   ' Temp:      TempSrc:      SpO2: 98% 99% 99% 98%  Weight:      Height:        Intake/Output Summary (Last 24 hours) at 08/27/2021 0830 Last data filed at 08/27/2021 0343 Gross per 24 hour  Intake 2017.8 ml  Output 1125 ml  Net 892.8 ml   Filed Weights   08/24/21 0626 08/25/21 0452 08/26/21 0600  Weight: 49.5 kg 47.3 kg 50.3 kg    Examination:  General  exam: Appears calm and comfortable  Respiratory system: Clear to auscultation. Respiratory effort normal. Cardiovascular system: S1 & S2 +. No rubs, gallops or clicks Gastrointestinal system: Abdomen is nondistended, soft and nontender. Ostomy bag noted. Normal bowel sounds heard. Central nervous system: Alert and oriented. Moves all extremities  Psychiatry: Judgement and insight appear normal. Flat mood and affect     Data Reviewed: I have personally reviewed following labs and imaging studies  CBC: Recent Labs  Lab 08/20/21 1708 08/21/21 0413 08/21/21 2030 08/22/21 0318 08/22/21 1200 08/23/21 0333 08/24/21 0216 08/25/21 0440 08/26/21 0550 08/27/21 0344  WBC 15.5*   < > 8.6 10.4  --  9.0 7.6 9.5 6.8 5.8  NEUTROABS 13.4*  --  6.8 8.4*  --   --   --   --   --   --   HGB 13.5   < > 9.4* 9.2*   < > 9.0* 11.5* 10.1* 9.5* 9.6*  HCT 42.7   < > 28.6* 28.7*   < > 26.1* 34.4* 31.8* 30.9* 30.5*  MCV 88.4   < > 86.9 88.6  --  83.4 83.9 88.8 91.2 90.8  PLT 247   < > 182 187  --  158 236 215 175 168   < > = values in this interval not displayed.   Basic Metabolic Panel: Recent Labs  Lab 08/23/21 0333 08/23/21 1055 08/24/21 0216 08/24/21 1626 08/25/21 0440 08/26/21 0550 08/27/21 0344  NA 123* 126* 139  --  142 141 140  K 2.8* 3.5 3.2* 4.0 4.0 3.5 3.3*  CL 78* 86* 96*  --  99 100 102  CO2 32 30 32  --  31 33* 30  GLUCOSE 120* 143* 142*  --  133* 125* 121*  BUN <5* <5* 6*  --  '8 14 12  ' CREATININE 0.59 0.68 0.85  --  0.82 0.79 0.69  CALCIUM 7.7* 8.1* 8.3*  --  8.4* 8.6* 8.5*  MG 1.2* 2.4 2.2  --  2.1  --  1.5*  PHOS 2.4* 2.1* 3.1  --  3.7  --  2.6   GFR: Estimated Creatinine Clearance: 51 mL/min (by C-G formula based on SCr of 0.69 mg/dL). Liver Function Tests: Recent Labs  Lab 08/20/21 1708 08/24/21 0216  AST 44* 21  ALT 34 14  ALKPHOS 114 81  BILITOT 0.6 0.7  PROT 7.7 6.3*  ALBUMIN 3.0* 2.7*   No results for input(s): "LIPASE", "AMYLASE" in the last 168 hours. No  results for input(s): "AMMONIA" in the last 168 hours. Coagulation Profile: Recent Labs  Lab 08/20/21 1708  INR 1.1   Cardiac Enzymes: No results for input(s): "CKTOTAL", "CKMB", "CKMBINDEX", "TROPONINI" in the last 168 hours. BNP (last 3 results) No results for input(s): "PROBNP" in the last 8760 hours. HbA1C: No results for input(s): "HGBA1C" in the last 72 hours. CBG: Recent Labs  Lab 08/21/21 0122  GLUCAP 140*   Lipid Profile: No results for input(s): "CHOL", "HDL", "LDLCALC", "TRIG", "CHOLHDL", "LDLDIRECT" in the last 72 hours. Thyroid Function Tests: No results for input(s): "TSH", "T4TOTAL", "FREET4", "T3FREE", "THYROIDAB" in the last 72 hours. Anemia Panel: No results for input(s): "VITAMINB12", "FOLATE", "FERRITIN", "TIBC", "IRON", "RETICCTPCT" in the last 72 hours. Sepsis Labs: Recent Labs  Lab 08/20/21 1708 08/20/21 1709 08/20/21 2011 08/21/21 1521  PROCALCITON 0.16  --   --   --   LATICACIDVEN  --  2.3* 1.3 1.1    Recent Results (from the past 240 hour(s))  Blood Culture (routine x 2)     Status: None   Collection Time: 08/20/21  5:11 PM   Specimen: BLOOD  Result Value Ref Range Status   Specimen Description BLOOD BLOOD LEFT HAND  Final   Special Requests   Final    BOTTLES DRAWN AEROBIC AND ANAEROBIC Blood Culture adequate volume   Culture   Final    NO GROWTH 5 DAYS Performed at [facility], [address], [place], [place] [address]    Report Status 08/25/2021 FINAL  Final  Blood Culture (routine x 2)     Status: None   Collection Time: 08/20/21  5:16 PM   Specimen: BLOOD  Result Value Ref Range Status   Specimen Description BLOOD LEFT ANTECUBITAL  Final   Special Requests   Final    BOTTLES DRAWN AEROBIC AND ANAEROBIC Blood Culture adequate volume   Culture   Final    NO GROWTH 5 DAYS Performed at [facility], [address], [place], [place] [address]    Report Status 08/25/2021 FINAL  Final  MRSA Next Gen by PCR,  Nasal     Status: None   Collection Time: 08/21/21  1:22 AM   Specimen: Nasal Mucosa; Nasal Swab  Result Value Ref Range Status   MRSA by PCR Next Gen NOT DETECTED NOT DETECTED Final    Comment: (NOTE) The GeneXpert MRSA Assay (FDA approved for NASAL specimens only), is one component of a comprehensive  MRSA colonization surveillance program. It is not intended to diagnose MRSA infection nor to guide or monitor treatment for MRSA infections. Test performance is not FDA approved in patients less than 70 years old. Performed at [facility], [address] [place], [place] [address]   Gastrointestinal Panel by PCR , Stool     Status: None   Collection Time: 08/21/21  2:50 AM   Specimen: Stool  Result Value Ref Range Status   Campylobacter species NOT DETECTED NOT DETECTED Final   Plesimonas shigelloides NOT DETECTED NOT DETECTED Final   Salmonella species NOT DETECTED NOT DETECTED Final   Yersinia enterocolitica NOT DETECTED NOT DETECTED Final   Vibrio species NOT DETECTED NOT DETECTED Final   Vibrio cholerae NOT DETECTED NOT DETECTED Final   Enteroaggregative E coli (EAEC) NOT DETECTED NOT DETECTED Final   Enteropathogenic E coli (EPEC) NOT DETECTED NOT DETECTED Final   Enterotoxigenic E coli (ETEC) NOT DETECTED NOT DETECTED Final   Shiga like toxin producing E coli (STEC) NOT DETECTED NOT DETECTED Final   Shigella/Enteroinvasive E coli (EIEC) NOT DETECTED NOT DETECTED Final   Cryptosporidium NOT DETECTED NOT DETECTED Final   Cyclospora cayetanensis NOT DETECTED NOT DETECTED Final   Entamoeba histolytica NOT DETECTED NOT DETECTED Final   Giardia lamblia NOT DETECTED NOT DETECTED Final   Adenovirus F40/41 NOT DETECTED NOT DETECTED Final   Astrovirus NOT DETECTED NOT DETECTED Final   Norovirus GI/GII NOT DETECTED NOT DETECTED Final   Rotavirus A NOT DETECTED NOT DETECTED Final   Sapovirus (I, II, IV, and V) NOT DETECTED NOT DETECTED Final    Comment: Performed at [facility], [address] [place], [place] [address]  Urine Culture     Status: None   Collection Time: 08/21/21  3:02 AM   Specimen: Urine, Catheterized  Result Value Ref Range Status   Specimen Description   Final    URINE, CATHETERIZED Performed at [facility], [address], [place], [place] [address]    Special Requests   Final    NONE Performed at [facility], [address], [place], [place] [address]    Culture   Final    NO GROWTH Performed at [facility], [address], [place], [place] [address]    Report Status 08/22/2021 FINAL  Final  C Difficile Quick Screen w PCR reflex     Status: None   Collection Time: 08/21/21 10:13 AM   Specimen: STOOL  Result Value Ref Range Status   C Diff antigen NEGATIVE NEGATIVE Final   C Diff toxin NEGATIVE NEGATIVE Final   C Diff interpretation No C. difficile detected.  Final    Comment: Performed at [facility], [address] [place], [place] [address]  Aerobic/Anaerobic Culture w Gram Stain (surgical/deep wound)     Status: None (Preliminary result)   Collection Time: 08/24/21  2:23 PM   Specimen: Abscess  Result Value Ref Range Status   Specimen Description   Final    ABSCESS Performed at [facility], [address], [place], [place] [address]    Special Requests   Final    NONE Performed at [facility], [address] [place], [place] [address]    Gram Stain NO ORGANISMS SEEN  Final   Culture   Final    NO GROWTH 2 DAYS Performed at [facility], [place] [address], [place],  [address]    Report Status PENDING  Incomplete  Radiology Studies: No results found.      Scheduled Meds:  budesonide (PULMICORT) nebulizer solution  0.25 mg Nebulization BID   Chlorhexidine Gluconate Cloth  6 each Topical Q0600   enoxaparin (LOVENOX) injection  40 mg Subcutaneous Q24H   fludrocortisone  0.1 mg Oral Daily   hydrocortisone sod  succinate (SOLU-CORTEF) inj  50 mg Intravenous BID   ipratropium-albuterol  3 mL Nebulization QID   melatonin  5 mg Oral QHS   midodrine  10 mg Oral TID WC   multivitamin with minerals  1 tablet Oral Daily   Continuous Infusions:  sodium chloride     norepinephrine (LEVOPHED) Adult infusion Stopped (08/27/21 0138)   piperacillin-tazobactam (ZOSYN)  IV 3.375 g (08/27/21 0146)   potassium chloride       LOS: 6 days    Time spent: 30 mins     Wyvonnia Dusky, MD [facility] Pager [phone]  If 7PM-7AM, please contact night-coverage [email or web address] 08/27/2021, 8:30 AM

## 2021-08-27 NOTE — Progress Notes (Signed)
  Chaplain On-Call responded to Spiritual Care Consult Order from Eppie Gibson, MD, regarding "major life transition".  Chaplain met the patient and offered reflective listening as patient described her difficulty to maintain a safe blood pressure, and also the challenges of living in an extended care facility.  Patient stated that she receives much encouragement from her husband, and stated her desire "just to go home".  Chaplain provided spiritual and emotional support and prayer.  Chaplain Pollyann Samples M.Div., [facility]

## 2021-08-27 NOTE — Consult Note (Signed)
PHARMACY CONSULT NOTE  Pharmacy Consult for Electrolyte Monitoring and Replacement   Recent Labs: Potassium (mmol/L)  Date Value  08/27/2021 3.3 (L)   Magnesium (mg/dL)  Date Value  08/27/2021 1.5 (L)   Calcium (mg/dL)  Date Value  08/27/2021 8.5 (L)   Albumin (g/dL)  Date Value  08/24/2021 2.7 (L)   Phosphorus (mg/dL)  Date Value  08/27/2021 2.6   Sodium (mmol/L)  Date Value  08/27/2021 140   Assessment: 71 y.o female with significant PMH of chronic respiratory failure with hypoxia, MDD, HLD, tobacco abuse, COPD, right breast cancer s/p mastectomy, CAD, PAD, solitary kidney, recurrent diverticulitis w/ perforation and abscess s/p partial colectomy with anastomosis and loop ileostomy creation 07/31/21 who presented to the ED with chief complaints of intermittent abdominal pain associated with n/v and generalized weakness.  Goal of Therapy:  Within normal limits  Plan:  K 3.3- KCL 10 meq IV x 4  and 40 meq po x1 ordered by NP Mag 1.5- Magnesium sulfate 4 gm IV x 1 ordered by NP --Follow-up electrolytes with AM labs tomorrow  Anthonia Monger A  08/27/2021 10:05 AM

## 2021-08-27 NOTE — Evaluation (Signed)
Physical Therapy Evaluation Patient Details Name: Brianna Briggs MRN: [id number] DOB: [birth year] Today's Date: 08/27/2021  History of Present Illness  Pt is a 71 y/o F admitted on 08/20/21 after presenting to the ED with c/c of intermittent abdominal pain with associated N/V & generalized weakness. Pt also found to be hypotensive. Pt was admitted to the ICU for tx of sepsis 2/2 suspected intraabdominal source & possible aspiration PNA. Pt is s/p CT guided aspiration of abdominal abscess on 08/25/21. PMH: chronic respiratory failure with hypoxia, covid 19 infection, MDD, HLD, tobacco abuse, COPD, R breast CA s/p mastectomy, CAD, PAD, solitary kidney, recurrent diverticulitis with perforation & abscess s/p partial colectomy with anastomosis & loop ileostomy creation 07/31/21  Clinical Impression  Pt seen for PT evaluation with pt's husband ([name]) present but exiting shortly after PT arrival. Pt pleasant, but tearful/emotional re: situation with PT providing encouragement throughout. Pt is able to complete STS with min assist & static standing & marching in place with BUE support on RW & min assist. Pt with trembling extremities, likely 2/2 weakness. Pt does endorse lightheadedness in standing that dissipates with sitting. Nurse cleared pt to walk & assisted with gait (provided chair follow) with pt ambulating ~55 ft with RW & min assist. Pt would benefit from STR upon d/c to maximize independence with functional mobility & reduce fall risk prior to return home.  BP checked in RUE:  Sitting after 1st standing attempt: 96/51 mmHg MAP 65, HR 79 Sitting after 2nd standing attempt: 85/68 mmHg MAP 73, HR 92 Sitting after gait: 94/63 mmHg MAP 73, HR 74 bpm     Recommendations for follow up therapy are one component of a multi-disciplinary discharge planning process, led by the attending physician.  Recommendations may be updated based on patient status, additional functional criteria and insurance  authorization.  Follow Up Recommendations Skilled nursing-short term rehab (<3 hours/day) Can patient physically be transported by private vehicle: No    Assistance Recommended at Discharge Frequent or constant Supervision/Assistance  Patient can return home with the following  A lot of help with walking and/or transfers;A lot of help with bathing/dressing/bathroom;Assistance with cooking/housework;Help with stairs or ramp for entrance;Assist for transportation    Equipment Recommendations Rolling walker (2 wheels);BSC/3in1  Recommendations for Other Services       Functional Status Assessment Patient has had a recent decline in their functional status and demonstrates the ability to make significant improvements in function in a reasonable and predictable amount of time.     Precautions / Restrictions Precautions Precautions: Fall Precaution Comments: new R side colostomy bag Restrictions Weight Bearing Restrictions: No      Mobility  Bed Mobility               General bed mobility comments: not tested, pt received & left sitting in recliner    Transfers Overall transfer level: Needs assistance Equipment used: Rolling walker (2 wheels) Transfers: Sit to/from Stand Sit to Stand: Min guard, Min assist                Ambulation/Gait Ambulation/Gait assistance: Min assist (+ chair follow for safety 2/2 low BP) Gait Distance (Feet): 55 Feet Assistive device: Rolling walker (2 wheels) Gait Pattern/deviations: Decreased step length - right, Decreased step length - left, Decreased stride length Gait velocity: decreased        Stairs            Wheelchair Mobility    Modified Rankin (Stroke Patients Only)  Balance Overall balance assessment: Needs assistance Sitting-balance support: No upper extremity supported Sitting balance-Leahy Scale: Good     Standing balance support: During functional activity, Bilateral upper extremity  supported Standing balance-Leahy Scale: Fair                               Pertinent Vitals/Pain Pain Assessment Pain Assessment: No/denies pain    Home Living Family/patient expects to be discharged to:: Private residence Living Arrangements: Spouse/significant other Available Help at Discharge: Family Type of Home: House Home Access: Stairs to enter Entrance Stairs-Rails: None Entrance Stairs-Number of Steps: 2   Home Layout: One level Home Equipment: None Additional Comments: Pt notes she recently was given supplemental O2 to wear at night.    Prior Function Prior Level of Function : Independent/Modified Independent             Mobility Comments: Prior to initial admission pt was independent without AD, but had stopped driving 2/2 pain ADLs Comments: per previous eval incresed difficulty with bathing/dressing due to home construction/bathroom with no AC     Hand Dominance        Extremity/Trunk Assessment   Upper Extremity Assessment Upper Extremity Assessment: Generalized weakness    Lower Extremity Assessment Lower Extremity Assessment: Generalized weakness (3/5 knee extension in sitting, 3/5 hip flexion in sitting)    Cervical / Trunk Assessment Cervical / Trunk Assessment: Normal  Communication   Communication: No difficulties  Cognition Arousal/Alertness: Awake/alert Behavior During Therapy: Flat affect, Anxious Overall Cognitive Status: Within Functional Limits for tasks assessed                                 General Comments: Pt is AxOx4, tearful throughout session & discouraged re: sitaution, PT offers education & requested nurse place chaplain consult        General Comments General comments (skin integrity, edema, etc.): Pt received on 1L/min, titrated down to room air with SPO2 >/=90% throughout    Exercises     Assessment/Plan    PT Assessment Patient needs continued PT services  PT Problem List Decreased  range of motion;Decreased strength;Cardiopulmonary status limiting activity;Pain;Decreased activity tolerance;Decreased balance;Decreased mobility;Decreased safety awareness;Decreased knowledge of use of DME       PT Treatment Interventions DME instruction;Therapeutic exercise;Gait training;Balance training;Stair training;Neuromuscular re-education;Functional mobility training;Patient/family education;Therapeutic activities    PT Goals (Current goals can be found in the Care Plan section)  Acute Rehab PT Goals Patient Stated Goal: go home PT Goal Formulation: With patient Time For Goal Achievement: 09/10/21 Potential to Achieve Goals: Good    Frequency Min 2X/week     Co-evaluation               AM-PAC PT "6 Clicks" Mobility  Outcome Measure Help needed turning from your back to your side while in a flat bed without using bedrails?: A Little Help needed moving from lying on your back to sitting on the side of a flat bed without using bedrails?: A Little Help needed moving to and from a bed to a chair (including a wheelchair)?: A Little Help needed standing up from a chair using your arms (e.g., wheelchair or bedside chair)?: A Little Help needed to walk in hospital room?: A Little Help needed climbing 3-5 steps with a railing? : A Lot 6 Click Score: 17    End of Session Equipment Utilized During  Treatment: Oxygen Activity Tolerance: Patient tolerated treatment well Patient left: in chair;with call bell/phone within reach Nurse Communication: Mobility status (O2) PT Visit Diagnosis: Unsteadiness on feet (R26.81);Muscle weakness (generalized) (M62.81);Difficulty in walking, not elsewhere classified (R26.2)    Time: 3086-5784 PT Time Calculation (min) (ACUTE ONLY): 26 min   Charges:   PT Evaluation $PT Eval Moderate Complexity: 1 Mod PT Treatments $Therapeutic Activity: 8-22 mins        Lavone Nian, PT, DPT 08/27/21, 12:22 PM   Waunita Schooner 08/27/2021,  12:19 PM

## 2021-08-27 NOTE — Progress Notes (Signed)
Patient ID: Brianna Briggs, female   DOB: [birth year], 71 y.o.   MRN: [id number]     [facility] Day(s): 6.   Interval History: Patient seen and examined, no acute events or new complaints overnight. Patient reports feeling better. She was able to eat a third of the chinese food without nausea or vomiting. No abdominal pain.   Vital signs in last 24 hours: [min-max] current  Temp:  [97.8 F (36.6 C)-98.1 F (36.7 C)] 98.1 F (36.7 C) (08/12 0000) Pulse Rate:  [39-75] 64 (08/12 0300) Resp:  [9-23] 14 (08/12 0300) BP: (92-118)/(40-77) 106/58 (08/12 0300) SpO2:  [93 %-100 %] 98 % (08/12 0705)     Height: '5\' 2"'$  (157.5 cm) Weight: 50.3 kg BMI (Calculated): 20.28   Physical Exam:  Constitutional: alert, cooperative and no distress  Respiratory: breathing non-labored at rest  Cardiovascular: regular rate and sinus rhythm  Gastrointestinal: soft, non-tender, and non-distended. Ileostomy pink and patent.   Labs:     Latest Ref Rng & Units 08/27/2021    3:44 AM 08/26/2021    5:50 AM 08/25/2021    4:40 AM  CBC  WBC 4.0 - 10.5 K/uL 5.8  6.8  9.5   Hemoglobin 12.0 - 15.0 g/dL 9.6  9.5  10.1   Hematocrit 36.0 - 46.0 % 30.5  30.9  31.8   Platelets 150 - 400 K/uL 168  175  215       Latest Ref Rng & Units 08/27/2021    3:44 AM 08/26/2021    5:50 AM 08/25/2021    4:40 AM  CMP  Glucose 70 - 99 mg/dL 121  125  133   BUN 8 - 23 mg/dL '12  14  8   '$ Creatinine 0.44 - 1.00 mg/dL 0.69  0.79  0.82   Sodium 135 - 145 mmol/L 140  141  142   Potassium 3.5 - 5.1 mmol/L 3.3  3.5  4.0   Chloride 98 - 111 mmol/L 102  100  99   CO2 22 - 32 mmol/L 30  33  31   Calcium 8.9 - 10.3 mg/dL 8.5  8.6  8.4     Imaging studies: No new pertinent imaging studies   Assessment/Plan:  71 y.o. female with sepsis   Possible etiologies are small intra-abdominal abscess versus pneumonia/COPD exacerbation  -Doing well. No fever. Still tolerating without pressors.  -tolerating diet -Abdomen  physical exam is benign -Agree to continue abx therapy as per primary team -Continue regular diet. Protein supplements will help with healing.  -No surgical intervention needed -Will continue to follow peripherally.   Arnold Long, MD

## 2021-08-27 NOTE — Evaluation (Signed)
Occupational Therapy Evaluation Patient Details Name: Brianna Briggs MRN: [id number] DOB: 02/16/1950 Today's Date: 08/27/2021   History of Present Illness Pt is a 71 year old female admitted with septic shock versus hypovolemic shock, s/p CT-guided aspiration on 8/10 as well as management of shock with a combination of antibiotics, IV fluids, stress dose steroids, and vasoactive medications; PMH significant for CAD, COPD (05/2020 PFTs show FEV1 of 0.8 L / 39% predicted, FEV1/FVC 35%, DLCO at 50% predicted) who was recently admitted in July 2023 with abdominal pain and found to have acute diverticulitis complicated by perforation and abscess formation.  She failed IR drainage and required partial colectomy with anastomosis and loop ileostomy creation   Clinical Impression   Chart reviewed, pt greeted in bed agreeable to OT evaluation. RN cleared pt for participation in OT evaluation. Pt is from rehab, reports she was amb 100' with RW prior to re-admission. Prior to admission in July, 2023 pt was generally MOD I in ADLs and amb community distances no AD. Pt presents with deficits in strength, endurance, activity tolerance all affecting safe and optimal ADL completion. Recommend discharge to STR to address functional deficits. Pt is left in bedside chair, NAD, all needs met. RN aware of pt status. OT will continue to follow acutely.   BP 95/62 (MAP 72) HR 77 supine in bed 85/60 (MAP 68) after toileting and transfer to bedside chair  88/56 (MAP 67) HR 73 after legs up, one minute rest in bedside chair; no c/o dizziness/nausea throughout      Recommendations for follow up therapy are one component of a multi-disciplinary discharge planning process, led by the attending physician.  Recommendations may be updated based on patient status, additional functional criteria and insurance authorization.   Follow Up Recommendations  Skilled nursing-short term rehab (<3 hours/day)    Assistance Recommended  at Discharge Frequent or constant Supervision/Assistance  Patient can return home with the following A little help with walking and/or transfers;A little help with bathing/dressing/bathroom;Help with stairs or ramp for entrance;Assist for transportation;Assistance with cooking/housework    Functional Status Assessment  Patient has had a recent decline in their functional status and demonstrates the ability to make significant improvements in function in a reasonable and predictable amount of time.  Equipment Recommendations  BSC/3in1    Recommendations for Other Services       Precautions / Restrictions Precautions Precautions: Fall Restrictions Weight Bearing Restrictions: No      Mobility Bed Mobility Overal bed mobility: Needs Assistance Bed Mobility: Supine to Sit     Supine to sit: Min assist          Transfers Overall transfer level: Needs assistance Equipment used: Rolling walker (2 wheels) Transfers: Sit to/from Stand Sit to Stand: Supervision                  Balance Overall balance assessment: Needs assistance Sitting-balance support: No upper extremity supported Sitting balance-Leahy Scale: Good     Standing balance support: During functional activity Standing balance-Leahy Scale: Fair                             ADL either performed or assessed with clinical judgement   ADL Overall ADL's : Needs assistance/impaired Eating/Feeding: Set up;Sitting   Grooming: Wash/dry hands;Wash/dry face;Sitting;Set up                   Toilet Transfer: Minimal assistance Toilet Transfer Details (indicate cue  type and reason): short amb transfer from bed>bsc>bedside chair HHA (approx 4-6' each time due to low BP) Toileting- Clothing Manipulation and Hygiene: Moderate assistance;Supervision/safety Toileting - Clothing Manipulation Details (indicate cue type and reason): MOD A ostomy care, supervision for peri care following urinating              Vision Baseline Vision/History: 1 Wears glasses Patient Visual Report: No change from baseline       Perception     Praxis      Pertinent Vitals/Pain Pain Assessment Pain Assessment: Faces Faces Pain Scale: Hurts a little bit Pain Location: ostomy site Pain Descriptors / Indicators: Discomfort, Grimacing Pain Intervention(s): Limited activity within patient's tolerance, Monitored during session, Repositioned     Hand Dominance     Extremity/Trunk Assessment Upper Extremity Assessment Upper Extremity Assessment: Generalized weakness   Lower Extremity Assessment Lower Extremity Assessment: Generalized weakness   Cervical / Trunk Assessment Cervical / Trunk Assessment: Normal   Communication Communication Communication: No difficulties   Cognition Arousal/Alertness: Awake/alert Behavior During Therapy: Flat affect Overall Cognitive Status: Within Functional Limits for tasks assessed                                       General Comments       Exercises Other Exercises Other Exercises: edu re: role of OT, role of rehab, discharge recommendations, ADL participation, indep ostomy care   Shoulder Instructions      Home Living Family/patient expects to be discharged to:: Private residence Living Arrangements: Spouse/significant other Available Help at Discharge: Family Type of Home: House Home Access: Stairs to enter Technical brewer of Steps: 2 Entrance Stairs-Rails: None Home Layout: One level     Bathroom Shower/Tub: Sponge bathes at baseline         Home Equipment: None          Prior Functioning/Environment Prior Level of Function : Independent/Modified Independent             Mobility Comments: Pt is from rehab- was amb with RW approx 100' per pt report; prior to previous admission indep no AD; ADLs Comments: per previous eval incresed difficulty with bathing/dressing due to home construction/bathroom with no  AC        OT Problem List: Decreased strength;Pain;Decreased activity tolerance;Decreased safety awareness;Impaired balance (sitting and/or standing);Decreased knowledge of use of DME or AE      OT Treatment/Interventions: Self-care/ADL training;Therapeutic activities;Therapeutic exercise;Energy conservation;Manual therapy;Balance training;DME and/or AE instruction;Patient/family education    OT Goals(Current goals can be found in the care plan section) Acute Rehab OT Goals Patient Stated Goal: go back to rehab OT Goal Formulation: With patient Time For Goal Achievement: 09/10/21 Potential to Achieve Goals: Good ADL Goals Pt Will Perform Grooming: with modified independence;standing Pt Will Perform Lower Body Dressing: with modified independence Pt Will Transfer to Toilet: with modified independence;ambulating Pt Will Perform Toileting - Clothing Manipulation and hygiene: with modified independence  OT Frequency: Min 2X/week    Co-evaluation              AM-PAC OT "6 Clicks" Daily Activity     Outcome Measure Help from another person eating meals?: None Help from another person taking care of personal grooming?: A Little Help from another person toileting, which includes using toliet, bedpan, or urinal?: A Little Help from another person bathing (including washing, rinsing, drying)?: A Little Help from another person to  put on and taking off regular upper body clothing?: A Little Help from another person to put on and taking off regular lower body clothing?: A Little 6 Click Score: 19   End of Session Equipment Utilized During Treatment: Oxygen Nurse Communication: Mobility status  Activity Tolerance: Patient tolerated treatment well Patient left: in chair;with call bell/phone within reach  OT Visit Diagnosis: Unsteadiness on feet (R26.81);Muscle weakness (generalized) (M62.81);History of falling (Z91.81)                Time: 3754-3606 OT Time Calculation (min): 31  min Charges:  OT General Charges $OT Visit: 1 Visit OT Evaluation $OT Eval Moderate Complexity: 1 Mod OT Treatments $Self Care/Home Management : 8-22 mins  Shanon Payor, OTD OTR/L  08/27/21, 10:22 AM

## 2021-08-28 DIAGNOSIS — Z8709 Personal history of other diseases of the respiratory system: Secondary | ICD-10-CM

## 2021-08-28 DIAGNOSIS — K651 Peritoneal abscess: Secondary | ICD-10-CM | POA: Diagnosis not present

## 2021-08-28 DIAGNOSIS — J9611 Chronic respiratory failure with hypoxia: Secondary | ICD-10-CM | POA: Diagnosis not present

## 2021-08-28 LAB — BASIC METABOLIC PANEL
Anion gap: 5 (ref 5–15)
BUN: 14 mg/dL (ref 8–23)
CO2: 32 mmol/L (ref 22–32)
Calcium: 8.1 mg/dL — ABNORMAL LOW (ref 8.9–10.3)
Chloride: 104 mmol/L (ref 98–111)
Creatinine, Ser: 0.64 mg/dL (ref 0.44–1.00)
GFR, Estimated: >60 mL/min (ref >60)
Glucose, Bld: 115 mg/dL — ABNORMAL HIGH (ref 70–99)
Potassium: 4 mmol/L (ref 3.5–5.1)
Sodium: 141 mmol/L (ref 135–145)

## 2021-08-28 LAB — CBC
HCT: 30.1 % — ABNORMAL LOW (ref 36.0–46.0)
Hemoglobin: 9.5 g/dL — ABNORMAL LOW (ref 12.0–15.0)
MCH: 28.6 pg (ref 26.0–34.0)
MCHC: 31.6 g/dL (ref 30.0–36.0)
MCV: 90.7 fL (ref 80.0–100.0)
Platelets: 182 10*3/uL (ref 150–400)
RBC: 3.32 MIL/uL — ABNORMAL LOW (ref 3.87–5.11)
RDW: 16.7 % — ABNORMAL HIGH (ref 11.5–15.5)
WBC: 6.4 10*3/uL (ref 4.0–10.5)
nRBC: 0 % (ref 0.0–0.2)

## 2021-08-28 LAB — MAGNESIUM: Magnesium: 2.2 mg/dL (ref 1.7–2.4)

## 2021-08-28 LAB — PHOSPHORUS: Phosphorus: 3 mg/dL (ref 2.5–4.6)

## 2021-08-28 MED ORDER — HYDROCORTISONE SOD SUC (PF) 100 MG IJ SOLR
50.0000 mg | Freq: Every day | INTRAMUSCULAR | Status: DC
  Administered 2021-08-29: 50 mg via INTRAVENOUS
  Filled 2021-08-28: qty 1

## 2021-08-28 NOTE — NC FL2 (Signed)
[place] LEVEL OF CARE SCREENING TOOL     IDENTIFICATION  Patient Name: Brianna Briggs Birthdate: Oct 08, 1950 Sex: female Admission Date (Current Location): 08/20/2021  [facility] and [place] Number:  Engineering geologist and Address:  [facility], [address], [place], [place] [address]      Provider Number: [id number]  Attending Physician Name and Address:  Wyvonnia Dusky, MD  Relative Name and Phone Number:  Henrene Pastor [phone]    Current Level of Care: Hospital Recommended Level of Care: [place] Prior Approval Number:    Date Approved/Denied:   PASRR Number: [id number]  Discharge Plan:      Current Diagnoses: Patient Active Problem List   Diagnosis Date Noted   Intra-abdominal abscess ([place])    Severe sepsis with lactic acidosis ([place]) 08/21/2021   Back pain 08/16/2021   Hypokalemia 08/11/2021   Hypophosphatemia 08/11/2021   COVID-19 virus infection 08/05/2021   MDD (major depressive disorder) 08/05/2021   Perforated diverticulum of intestine 07/28/2021   Malnutrition of moderate degree 07/28/2021   Diverticulitis of intestine with abscess 07/27/2021   Abdominal pain 07/27/2021   Acute on chronic respiratory failure with hypoxia ([facility])    Hypotension    Colonic diverticular abscess 07/19/2021   Chronic obstructive pulmonary disease (COPD) ([place]) 07/19/2021   Diverticulitis large intestine 06/06/2021   Diverticulitis of large intestine with abscess without bleeding 07/17/2019   Chronic eczema 08/30/2015   Health care maintenance 03/01/2015   Simple chronic bronchitis ([place]) 03/01/2015   Shortness of breath 12/16/2014   Hyperlipidemia, mixed 06/04/2014   Hypercholesterolemia 02/26/2014   PVC (premature ventricular contraction) 09/04/2013   Tobacco abuse 09/04/2013    Orientation RESPIRATION BLADDER Height & Weight     Self, Time, Situation, Place  O2 (4 liters) Continent, External catheter  Weight: 50.4 kg Height:  '5\' 2"'$  (157.5 cm)  BEHAVIORAL SYMPTOMS/MOOD NEUROLOGICAL BOWEL NUTRITION STATUS      Ileostomy Diet  AMBULATORY STATUS COMMUNICATION OF NEEDS Skin   Extensive Assist Verbally Other (Comment) (stoma)                       Personal Care Assistance Level of Assistance  Bathing, Feeding, Dressing Bathing Assistance: Limited assistance Feeding assistance: Limited assistance Dressing Assistance: Limited assistance     Functional Limitations Info  Sight, Hearing, Speech Sight Info: Impaired Hearing Info: Adequate Speech Info: Impaired    SPECIAL CARE FACTORS FREQUENCY  PT (By licensed PT), OT (By licensed OT)     PT Frequency: 5 imes a week OT Frequency: 5 times per week            Contractures Contractures Info: Not present    Additional Factors Info  Code Status, Allergies Code Status Info: Full code Allergies Info: Amoxicillin-pot Clavulanate, Biaxin (Clarithromycin), Oxycodone, Codeine, Nitrofurantoin           Current Medications (08/28/2021):  This is the current hospital active medication list Current Facility-Administered Medications  Medication Dose Route Frequency Provider Last Rate Last Admin   0.9 %  sodium chloride infusion  250 mL Intravenous Continuous Ouma, Bing Neighbors, NP       acetaminophen (TYLENOL) tablet 650 mg  650 mg Oral Q6H PRN Teressa Lower, NP   650 mg at 08/21/21 1010   atropine injection 1 mg  1 mg Intravenous PRN Teressa Lower, NP       budesonide (PULMICORT) nebulizer solution 0.25 mg  0.25 mg Nebulization BID Ouma,  Bing Neighbors, NP   0.25 mg at 08/28/21 7106   Chlorhexidine Gluconate Cloth 2 % PADS 6 each  6 each Topical Q0600 Ottie Glazier, MD   6 each at 08/28/21 0646   docusate sodium (COLACE) capsule 100 mg  100 mg Oral BID PRN Lang Snow, NP       enoxaparin (LOVENOX) injection 40 mg  40 mg Subcutaneous Q24H Flora Lipps, MD   40 mg at 08/27/21 2259   fludrocortisone (FLORINEF)  tablet 0.1 mg  0.1 mg Oral Daily Flora Lipps, MD   0.1 mg at 08/28/21 1021   HYDROcodone-acetaminophen (NORCO/VICODIN) 5-325 MG per tablet 1-2 tablet  1-2 tablet Oral Q6H PRN Bradly Bienenstock, NP   1 tablet at 08/27/21 1049   hydrocortisone sodium succinate (SOLU-CORTEF) 100 MG injection 50 mg  50 mg Intravenous BID Armando Reichert, MD   50 mg at 08/28/21 1008   ipratropium-albuterol (DUONEB) 0.5-2.5 (3) MG/3ML nebulizer solution 3 mL  3 mL Nebulization Q6H PRN Lang Snow, NP       ipratropium-albuterol (DUONEB) 0.5-2.5 (3) MG/3ML nebulizer solution 3 mL  3 mL Nebulization TID Wyvonnia Dusky, MD   3 mL at 08/28/21 2694   loperamide (IMODIUM) capsule 2 mg  2 mg Oral PRN Flora Lipps, MD       melatonin tablet 5 mg  5 mg Oral QHS Lang Snow, NP   5 mg at 08/27/21 2253   midodrine (PROAMATINE) tablet 10 mg  10 mg Oral TID WC Flora Lipps, MD   10 mg at 08/28/21 1008   morphine (PF) 2 MG/ML injection 1 mg  1 mg Intravenous Q4H PRN Teressa Lower, NP   1 mg at 08/22/21 1115   multivitamin with minerals tablet 1 tablet  1 tablet Oral Daily Flora Lipps, MD   1 tablet at 08/28/21 1009   nicotine (NICOTROL) 10 MG inhaler 1 continuous puffing  1 continuous puffing Inhalation PRN Armando Reichert, MD       ondansetron (ZOFRAN) injection 4 mg  4 mg Intravenous Q6H PRN Tonye Royalty, NP   4 mg at 08/23/21 1027   phenol (CHLORASEPTIC) mouth spray 1 spray  1 spray Mouth/Throat PRN Flora Lipps, MD       polyethylene glycol (MIRALAX / GLYCOLAX) packet 17 g  17 g Oral Daily PRN Lang Snow, NP         Discharge Medications: Please see discharge summary for a list of discharge medications.  Relevant Imaging Results:  Relevant Lab Results:   Additional Information SSN [id number]  Conception Oms, RN

## 2021-08-28 NOTE — TOC Progression Note (Signed)
Transition of Care [facility] - Progression Note    Patient Details  Name: Brianna Briggs MRN: [id number] Date of Birth: [birth year]  Transition of Care [facility]) CM/SW [place], RN Phone Number: 08/28/2021, 12:31 PM  Clinical Narrative:     Met with the patient and her husband in the room, The patient was recently at [facility] for [place], she is needing STR more and is agreeable to a bedsearch,   Expected Discharge Plan: [place] Barriers to Discharge: Continued Medical Work up  Expected Discharge Plan and Services Expected Discharge Plan: [place]   Discharge Planning Services: CM Consult Post Acute Care Choice: [place] Living arrangements for the past 2 months: Single Family Home                 DME Arranged: N/A DME Agency: NA       HH Arranged: NA           Social Determinants of Health (SDOH) Interventions    Readmission Risk Interventions    07/28/2021    4:14 PM  Readmission Risk Prevention Plan  Transportation Screening Complete  PCP or Specialist Appt within 5-7 Days Complete  Home Care Screening Complete  Medication Review (RN CM) Complete

## 2021-08-28 NOTE — Progress Notes (Signed)
PROGRESS NOTE    Brianna Briggs  PHX:[id number] DOB: 11/25/1950 DOA: 08/20/2021 PCP: Kirk Ruths, MD   Assessment & Plan:   Principal Problem:   Severe sepsis with lactic acidosis [facility]) Active Problems:   Intra-abdominal abscess (HCC)  Assessment and Plan: Septic shock: please Dr. Gretta Began note on how pt met septic shock criteria. Likely secondary to diverticular abscess s/p drainage. Completed 7 day course of zosyn but will consult ID tomorrow. Continue on stressed dose steroid taper. Continue on florinef. Septic shock resolved   Diverticular abscess: s/p drainage. Completed 7 day course of IV zosyn but will consult ID tomorrow as this is a recurrence. 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  Continue on supplemental oxygen at night only   COPD: continue on bronchodilators, encourage incentive spirometry    AKI: resolved  Hypokalemia: WNL today   Hypomagnesemia: WNL today     DVT prophylaxis: lovenox  Code Status: full  Family Communication: discussed pt's care w/ pt's family at bedside and answered their questions  Disposition Plan: PT/OT recs SNF   Level of care: Progressive  Status is: Inpatient Remains inpatient appropriate because: severity of illness    Consultants:  ICU General surg   Procedures:   Antimicrobials: zosyn    Subjective: Pt c/o malaise   Objective: Vitals:   08/28/21 0025 08/28/21 0356 08/28/21 0600 08/28/21 0807  BP: (!) 104/58 112/67  (!) 109/54  Pulse: 63 61  69  Resp: '19 19  18  ' Temp: 98.1 F (36.7 C) 98.5 F (36.9 C)  97.8 F (36.6 C)  TempSrc: Oral Oral    SpO2: 96% 97%  98%  Weight:   50.4 kg   Height:        Intake/Output Summary (Last 24 hours) at 08/28/2021 0816 Last data filed at 08/28/2021 0630 Gross per 24  hour  Intake 100 ml  Output 125 ml  Net -25 ml   Filed Weights   08/25/21 0452 08/26/21 0600 08/28/21 0600  Weight: 47.3 kg 50.3 kg 50.4 kg    Examination:  General exam: Appears comfortable  Respiratory system: clear breath sounds b/l Cardiovascular system: S1/S2+. No rubs or gallops  Gastrointestinal system: Abd is soft, NT, ND, ostomy bag present, normal bowel sounds  Central nervous system: Alert and oriented. Moves all extremities   Psychiatry: Judgement and insight appears normal. Flat mood and affect      Data Reviewed: I have personally reviewed following labs and imaging studies  CBC: Recent Labs  Lab 08/21/21 2030 08/22/21 0318 08/22/21 1200 08/24/21 0216 08/25/21 0440 08/26/21 0550 08/27/21 0344 08/28/21 0510  WBC 8.6 10.4   < > 7.6 9.5 6.8 5.8 6.4  NEUTROABS 6.8 8.4*  --   --   --   --   --   --   HGB 9.4* 9.2*   < > 11.5* 10.1* 9.5* 9.6* 9.5*  HCT 28.6* 28.7*   < > 34.4* 31.8* 30.9* 30.5* 30.1*  MCV 86.9 88.6   < > 83.9 88.8 91.2 90.8 90.7  PLT 182 187   < > 236 215 175 168 182   < > = values in this interval not displayed.   Basic Metabolic Panel: Recent Labs  Lab 08/23/21 1055 08/24/21 0216 08/24/21 1626 08/25/21 0440 08/26/21 0550 08/27/21 0344 08/28/21 0510  NA  126* 139  --  142 141 140 141  K 3.5 3.2* 4.0 4.0 3.5 3.3* 4.0  CL 86* 96*  --  99 100 102 104  CO2 30 32  --  31 33* 30 32  GLUCOSE 143* 142*  --  133* 125* 121* 115*  BUN <5* 6*  --  '8 14 12 14  ' CREATININE 0.68 0.85  --  0.82 0.79 0.69 0.64  CALCIUM 8.1* 8.3*  --  8.4* 8.6* 8.5* 8.1*  MG 2.4 2.2  --  2.1  --  1.5* 2.2  PHOS 2.1* 3.1  --  3.7  --  2.6 3.0   GFR: Estimated Creatinine Clearance: 51 mL/min (by C-G formula based on SCr of 0.64 mg/dL). Liver Function Tests: Recent Labs  Lab 08/24/21 0216  AST 21  ALT 14  ALKPHOS 81  BILITOT 0.7  PROT 6.3*  ALBUMIN 2.7*   No results for input(s): "LIPASE", "AMYLASE" in the last 168 hours. No results for input(s):  "AMMONIA" in the last 168 hours. Coagulation Profile: No results for input(s): "INR", "PROTIME" in the last 168 hours.  Cardiac Enzymes: No results for input(s): "CKTOTAL", "CKMB", "CKMBINDEX", "TROPONINI" in the last 168 hours. BNP (last 3 results) No results for input(s): "PROBNP" in the last 8760 hours. HbA1C: No results for input(s): "HGBA1C" in the last 72 hours. CBG: No results for input(s): "GLUCAP" in the last 168 hours.  Lipid Profile: No results for input(s): "CHOL", "HDL", "LDLCALC", "TRIG", "CHOLHDL", "LDLDIRECT" in the last 72 hours. Thyroid Function Tests: No results for input(s): "TSH", "T4TOTAL", "FREET4", "T3FREE", "THYROIDAB" in the last 72 hours. Anemia Panel: No results for input(s): "VITAMINB12", "FOLATE", "FERRITIN", "TIBC", "IRON", "RETICCTPCT" in the last 72 hours. Sepsis Labs: Recent Labs  Lab 08/21/21 1521  LATICACIDVEN 1.1    Recent Results (from the past 240 hour(s))  Blood Culture (routine x 2)     Status: None   Collection Time: 08/20/21  5:11 PM   Specimen: BLOOD  Result Value Ref Range Status   Specimen Description BLOOD BLOOD LEFT HAND  Final   Special Requests   Final    BOTTLES DRAWN AEROBIC AND ANAEROBIC Blood Culture adequate volume   Culture   Final    NO GROWTH 5 DAYS Performed at [facility], [address] [place], [place] [address]    Report Status 08/25/2021 FINAL  Final  Blood Culture (routine x 2)     Status: None   Collection Time: 08/20/21  5:16 PM   Specimen: BLOOD  Result Value Ref Range Status   Specimen Description BLOOD LEFT ANTECUBITAL  Final   Special Requests   Final    BOTTLES DRAWN AEROBIC AND ANAEROBIC Blood Culture adequate volume   Culture   Final    NO GROWTH 5 DAYS Performed at [facility], [address] [place],  [address]    Report Status 08/25/2021 FINAL  Final  MRSA Next Gen by PCR, Nasal     Status: None   Collection Time: 08/21/21  1:22 AM   Specimen: Nasal Mucosa;  Nasal Swab  Result Value Ref Range Status   MRSA by PCR Next Gen NOT DETECTED NOT DETECTED Final    Comment: (NOTE) The GeneXpert MRSA Assay (FDA approved for NASAL specimens only), is one component of a comprehensive MRSA colonization surveillance program. It is not intended to diagnose MRSA infection nor to guide or monitor treatment for MRSA infections. Test performance is not FDA approved in patients less than  58 years old. Performed at [facility], [address] [place], [place] [address]   Gastrointestinal Panel by PCR , Stool     Status: None   Collection Time: 08/21/21  2:50 AM   Specimen: Stool  Result Value Ref Range Status   Campylobacter species NOT DETECTED NOT DETECTED Final   Plesimonas shigelloides NOT DETECTED NOT DETECTED Final   Salmonella species NOT DETECTED NOT DETECTED Final   Yersinia enterocolitica NOT DETECTED NOT DETECTED Final   Vibrio species NOT DETECTED NOT DETECTED Final   Vibrio cholerae NOT DETECTED NOT DETECTED Final   Enteroaggregative E coli (EAEC) NOT DETECTED NOT DETECTED Final   Enteropathogenic E coli (EPEC) NOT DETECTED NOT DETECTED Final   Enterotoxigenic E coli (ETEC) NOT DETECTED NOT DETECTED Final   Shiga like toxin producing E coli (STEC) NOT DETECTED NOT DETECTED Final   Shigella/Enteroinvasive E coli (EIEC) NOT DETECTED NOT DETECTED Final   Cryptosporidium NOT DETECTED NOT DETECTED Final   Cyclospora cayetanensis NOT DETECTED NOT DETECTED Final   Entamoeba histolytica NOT DETECTED NOT DETECTED Final   Giardia lamblia NOT DETECTED NOT DETECTED Final   Adenovirus F40/41 NOT DETECTED NOT DETECTED Final   Astrovirus NOT DETECTED NOT DETECTED Final   Norovirus GI/GII NOT DETECTED NOT DETECTED Final   Rotavirus A NOT DETECTED NOT DETECTED Final   Sapovirus (I, II, IV, and V) NOT DETECTED NOT DETECTED Final    Comment: Performed at [facility], [address] [place], [place] [address]  Urine Culture     Status:  None   Collection Time: 08/21/21  3:02 AM   Specimen: Urine, Catheterized  Result Value Ref Range Status   Specimen Description   Final    URINE, CATHETERIZED Performed at [facility], [address] [place], [place] [address]    Special Requests   Final    NONE Performed at [facility], [address] [place], [place] [address]    Culture   Final    NO GROWTH Performed at [facility], [address] [place], [place] [address]    Report Status 08/22/2021 FINAL  Final  C Difficile Quick Screen w PCR reflex     Status: None   Collection Time: 08/21/21 10:13 AM   Specimen: STOOL  Result Value Ref Range Status   C Diff antigen NEGATIVE NEGATIVE Final   C Diff toxin NEGATIVE NEGATIVE Final   C Diff interpretation No C. difficile detected.  Final    Comment: Performed at [facility], [address] [place], [place] [address]  Aerobic/Anaerobic Culture w Gram Stain (surgical/deep wound)     Status: None (Preliminary result)   Collection Time: 08/24/21  2:23 PM   Specimen: Abscess  Result Value Ref Range Status   Specimen Description   Final    ABSCESS Performed at [facility], [address], [place], [place] [address]    Special Requests   Final    NONE Performed at [facility], [address] [place], [place] [address]    Gram Stain NO ORGANISMS SEEN  Final   Culture   Final    NO GROWTH 3 DAYS NO ANAEROBES ISOLATED; CULTURE IN PROGRESS FOR 5 DAYS Performed at [facility], [place] [address], [place],  [address]    Report Status PENDING  Incomplete         Radiology Studies: No results found.      Scheduled Meds:  budesonide (PULMICORT)  nebulizer solution  0.25 mg Nebulization BID   Chlorhexidine Gluconate Cloth  6 each Topical Q0600   enoxaparin (LOVENOX) injection  40 mg Subcutaneous Q24H   fludrocortisone  0.1 mg Oral Daily   hydrocortisone sod succinate (SOLU-CORTEF) inj  50 mg  Intravenous BID   ipratropium-albuterol  3 mL Nebulization TID   melatonin  5 mg Oral QHS   midodrine  10 mg Oral TID WC   multivitamin with minerals  1 tablet Oral Daily   Continuous Infusions:  sodium chloride     norepinephrine (LEVOPHED) Adult infusion Stopped (08/27/21 0138)     LOS: 7 days    Time spent: 30 mins     Wyvonnia Dusky, MD [facility] Pager [phone]  If 7PM-7AM, please contact night-coverage [email or web address] 08/28/2021, 8:16 AM

## 2021-08-29 DIAGNOSIS — K651 Peritoneal abscess: Secondary | ICD-10-CM | POA: Diagnosis not present

## 2021-08-29 DIAGNOSIS — J690 Pneumonitis due to inhalation of food and vomit: Secondary | ICD-10-CM

## 2021-08-29 DIAGNOSIS — A419 Sepsis, unspecified organism: Secondary | ICD-10-CM | POA: Diagnosis not present

## 2021-08-29 DIAGNOSIS — R6520 Severe sepsis without septic shock: Secondary | ICD-10-CM | POA: Diagnosis not present

## 2021-08-29 DIAGNOSIS — Z8709 Personal history of other diseases of the respiratory system: Secondary | ICD-10-CM | POA: Diagnosis not present

## 2021-08-29 DIAGNOSIS — Z7189 Other specified counseling: Secondary | ICD-10-CM | POA: Diagnosis not present

## 2021-08-29 DIAGNOSIS — E872 Acidosis, unspecified: Secondary | ICD-10-CM | POA: Diagnosis not present

## 2021-08-29 DIAGNOSIS — J9611 Chronic respiratory failure with hypoxia: Secondary | ICD-10-CM | POA: Diagnosis not present

## 2021-08-29 LAB — CBC
HCT: 32.1 % — ABNORMAL LOW (ref 36.0–46.0)
Hemoglobin: 10.1 g/dL — ABNORMAL LOW (ref 12.0–15.0)
MCH: 28.4 pg (ref 26.0–34.0)
MCHC: 31.5 g/dL (ref 30.0–36.0)
MCV: 90.2 fL (ref 80.0–100.0)
Platelets: 165 10*3/uL (ref 150–400)
RBC: 3.56 MIL/uL — ABNORMAL LOW (ref 3.87–5.11)
RDW: 17.3 % — ABNORMAL HIGH (ref 11.5–15.5)
WBC: 7.2 10*3/uL (ref 4.0–10.5)
nRBC: 0 % (ref 0.0–0.2)

## 2021-08-29 LAB — BASIC METABOLIC PANEL
Anion gap: 7 (ref 5–15)
BUN: 14 mg/dL (ref 8–23)
CO2: 31 mmol/L (ref 22–32)
Calcium: 8.6 mg/dL — ABNORMAL LOW (ref 8.9–10.3)
Chloride: 102 mmol/L (ref 98–111)
Creatinine, Ser: 0.59 mg/dL (ref 0.44–1.00)
GFR, Estimated: >60 mL/min (ref >60)
Glucose, Bld: 95 mg/dL (ref 70–99)
Potassium: 3.1 mmol/L — ABNORMAL LOW (ref 3.5–5.1)
Sodium: 140 mmol/L (ref 135–145)

## 2021-08-29 LAB — AEROBIC/ANAEROBIC CULTURE W GRAM STAIN (SURGICAL/DEEP WOUND)
Culture: NO GROWTH
Gram Stain: NONE SEEN

## 2021-08-29 MED ORDER — POTASSIUM CHLORIDE CRYS ER 20 MEQ PO TBCR
40.0000 meq | EXTENDED_RELEASE_TABLET | Freq: Two times a day (BID) | ORAL | Status: AC
Start: 2021-08-29 — End: 2021-08-29
  Administered 2021-08-29 (×2): 40 meq via ORAL
  Filled 2021-08-29 (×2): qty 2

## 2021-08-29 MED ORDER — IPRATROPIUM-ALBUTEROL 0.5-2.5 (3) MG/3ML IN SOLN
3.0000 mL | Freq: Four times a day (QID) | RESPIRATORY_TRACT | Status: DC
Start: 2021-08-29 — End: 2021-08-31
  Administered 2021-08-29 – 2021-08-31 (×6): 3 mL via RESPIRATORY_TRACT
  Filled 2021-08-29 (×5): qty 3

## 2021-08-29 MED ORDER — IPRATROPIUM-ALBUTEROL 0.5-2.5 (3) MG/3ML IN SOLN: 3.0000 mL | Freq: Three times a day (TID) | RESPIRATORY_TRACT | Status: DC

## 2021-08-29 NOTE — Care Management Important Message (Signed)
Important Message  Patient Details  Name: Brianna Briggs MRN: [id number] Date of Birth: 1950-06-16   Medicare Important Message Given:  Yes     [name] 08/29/2021, 11:56 AM

## 2021-08-29 NOTE — Progress Notes (Signed)
Initial Nutrition Assessment  DOCUMENTATION CODES:   Non-severe (moderate) malnutrition in context of chronic illness  INTERVENTION:   -Continue regular diet -Continue MVI with minerals daily -D/c Magic Cup -D/c [employer] Instant Breakfast -Spiritual Care Consult for emotional support  NUTRITION DIAGNOSIS:   Moderate Malnutrition related to chronic illness (COPD) as evidenced by moderate fat depletion, moderate muscle depletion, severe muscle depletion.  GOAL:   Patient will meet greater than or equal to 90% of their needs  MONITOR:   PO intake, Supplement acceptance  REASON FOR ASSESSMENT:   Malnutrition Screening Tool    ASSESSMENT:   Pt with significant PMH of Chronic respiratory failure with hypoxia, COVID-19 virus infection, MDD (major depressive disorder), Hyperlipidemia, mixed, Tobacco abuse, Chronic obstructive pulmonary disease (COPD) ([employer]), Right Breast, Cancer s/p mastectomy, CAD, PAD, Solitary Kidney, recurrent Diverticulitis w/perforation and abscess s/p partial colectomy with anastomosis and loop ileostomy creation 07/31/21 who presented with chief complaints of intermittent abdominal pain associated with n/v and generalized weakness.  Pt admitted with sepsis with septic shock.   Reviewed I/O's: -540 ml x 24 hours and +3.3 L since admission  UOP: 350 ml x 24 hours  Colostomy output: 550 ml x 24 hours  Per general surgery notes, no surgical interventions needed at this time.   Spoke with pt at bedside, who reports feel very frustrated over being in the hospital. She speaks about her major life transitions including new colostomy, contracting COVID, and recently going to a rehab facility. Pt became tearful at time of visit and states "I'm just going crazy here. I don't know which direction to go. I really just want to go home". RD provided emotional support, supportive presence, and validated her feelings. She is agreeable to spiritual care consult for  emotional support.   Pt reports her appetite has improved and she has gained some weight. Pt reports she did not eat well at [facility] because the food was "too spicy". Pt does not like the hospital food and her husband has been bringing her snacks and meals. Per pt, she eats about 50% of hospital breakfast and also consumes whatever breakfast her husband brings her. Pt also eats dinner (ate [place] food yesterday and is eating [employer] today). Pt also snacking on candy, cookies, and soda. Pt dislikes all supplements that have been offered to her (Ensure, [employer], [employer], [employer], and [employer]); she states that all supplements on the formulary are "nasty" and refuses to drink them. Removed Magic Cups and [employer] Breakfast supplements per her request.   Reviewed wt hx; pt has experienced a 10% wt loss over the past month, which is significant for time frame.   Discussed importance of good meal intake to promote healing.   Per TOC notes, plan to transition back to SNF at discharge.   Medications reviewed and include potassium and melatonin.   Labs reviewed: K: 3.1, CBGS: 140 (inpatient orders for glycemic control are none).    NUTRITION - FOCUSED PHYSICAL EXAM:  Flowsheet Row Most Recent Value  Orbital Region Moderate depletion  Upper Arm Region Moderate depletion  Thoracic and Lumbar Region Moderate depletion  Buccal Region Moderate depletion  Temple Region Moderate depletion  Clavicle Bone Region Severe depletion  Clavicle and Acromion Bone Region Severe depletion  Scapular Bone Region Severe depletion  Dorsal Hand Moderate depletion  Patellar Region Severe depletion  Anterior Thigh Region Severe depletion  Posterior Calf Region Severe depletion  Edema (RD Assessment) None  Hair Reviewed  Eyes Reviewed  Mouth Reviewed  Skin Reviewed  Nails Reviewed       Diet Order:   Diet Order             Diet regular Room service appropriate?  Yes; Fluid consistency: Thin  Diet effective now                   EDUCATION NEEDS:   Education needs have been addressed  Skin:  Skin Assessment: Reviewed RN Assessment  Last BM:  08/29/21 (250 output via colostomy)  Height:   Ht Readings from Last 1 Encounters:  08/20/21 '5\' 2"'$  (1.575 m)    Weight:   Wt Readings from Last 1 Encounters:  08/29/21 51.5 kg    Ideal Body Weight:  50 kg  BMI:  Body mass index is 20.78 kg/m.  Estimated Nutritional Needs:   Kcal:  1600-1800  Protein:  90-105 grams  Fluid:  > 1.6 L    Loistine Chance, RD, LDN, [place] Registered Dietitian II Certified Diabetes Care and Education Specialist Please refer to [facility] for RD and/or RD on-call/weekend/after hours pager

## 2021-08-29 NOTE — Progress Notes (Addendum)
PROGRESS NOTE    Brianna Briggs  OTL:[id number] DOB: 1950-08-01 DOA: 08/20/2021 PCP: Kirk Ruths, MD   Assessment & Plan:   Principal Problem:   Severe sepsis with lactic acidosis [facility]) Active Problems:   Intra-abdominal abscess (HCC)  Assessment and Plan: Septic shock: please Dr. Gretta Began note on how pt met septic shock criteria. Likely secondary to diverticular abscess s/p drainage. Completed 7 day course of zosyn but will consult ID tomorrow. Completed stress dose steroid taper. Continue on florinef. Septic shock resolved   Diverticular abscess: s/p drainage. completed 7 day course of IV zosyn. ID consulted. 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 Continue on supplemental oxygen at night only   COPD: continue on bronchodilators, encourage incentive spirometry   AKI: resolved  Hypokalemia: potassium ordered   Hypomagnesemia: WNL today   Hypoalbuminemia: encourage po intake    DVT prophylaxis: lovenox  Code Status: full  Family Communication: discussed pt's care w/ pt's husband and answered his questions  Disposition Plan: PT/OT recs SNF   Level of care: Progressive  Status is: Inpatient Remains inpatient appropriate because: severity of illness    Consultants:  ICU General surg   Procedures:   Antimicrobials:    Subjective: Pt c/o feeling overwhelmed    Objective: Vitals:   08/29/21 0500 08/29/21 0759 08/29/21 0811 08/29/21 1206  BP:  102/65  101/64  Pulse:  84  (!) 37  Resp:  16  16  Temp:  97.6 F (36.4 C)  98.1 F (36.7 C)  TempSrc:      SpO2:  98% 92% 93%  Weight: 51.5 kg     Height:        Intake/Output Summary (Last 24 hours) at 08/29/2021 1307 Last data filed at 08/29/2021 1100 Gross per 24 hour  Intake --  Output 900 ml  Net  -900 ml   Filed Weights   08/26/21 0600 08/28/21 0600 08/29/21 0500  Weight: 50.3 kg 50.4 kg 51.5 kg    Examination:  General exam: Appears upset and tearful  Respiratory system: clear breath sounds b/l  Cardiovascular system: S1 & S2+. No rubs or clicks  Gastrointestinal system: Abd is soft, NT, ostomy bag is present, normal bowel sounds   Central nervous system: alert and oriented. Moves all extremities   Psychiatry: Judgement and insight appears normal. Tearful mood and affect     Data Reviewed: I have personally reviewed following labs and imaging studies  CBC: Recent Labs  Lab 08/25/21 0440 08/26/21 0550 08/27/21 0344 08/28/21 0510 08/29/21 0930  WBC 9.5 6.8 5.8 6.4 7.2  HGB 10.1* 9.5* 9.6* 9.5* 10.1*  HCT 31.8* 30.9* 30.5* 30.1* 32.1*  MCV 88.8 91.2 90.8 90.7 90.2  PLT 215 175 168 182 974   Basic Metabolic Panel: Recent Labs  Lab 08/23/21 1055 08/24/21 0216 08/24/21 1626 08/25/21 0440 08/26/21 0550 08/27/21 0344 08/28/21 0510 08/29/21 0930  NA 126* 139  --  142 141 140 141 140  K 3.5 3.2*   < > 4.0 3.5 3.3* 4.0 3.1*  CL 86* 96*  --  99 100 102 104 102  CO2 30 32  --  31 33* 30 32 31  GLUCOSE 143* 142*  --  133* 125* 121* 115* 95  BUN <5* 6*  --  '8 14 12 14 14  ' CREATININE 0.68  0.85  --  0.82 0.79 0.69 0.64 0.59  CALCIUM 8.1* 8.3*  --  8.4* 8.6* 8.5* 8.1* 8.6*  MG 2.4 2.2  --  2.1  --  1.5* 2.2  --   PHOS 2.1* 3.1  --  3.7  --  2.6 3.0  --    < > = values in this interval not displayed.   GFR: Estimated Creatinine Clearance: 51 mL/min (by C-G formula based on SCr of 0.59 mg/dL). Liver Function Tests: Recent Labs  Lab 08/24/21 0216  AST 21  ALT 14  ALKPHOS 81  BILITOT 0.7  PROT 6.3*  ALBUMIN 2.7*   No results for input(s): "LIPASE", "AMYLASE" in the last 168 hours. No results for input(s): "AMMONIA" in the last 168 hours. Coagulation Profile: No results for input(s): "INR", "PROTIME" in the last 168 hours.  Cardiac Enzymes: No results for  input(s): "CKTOTAL", "CKMB", "CKMBINDEX", "TROPONINI" in the last 168 hours. BNP (last 3 results) No results for input(s): "PROBNP" in the last 8760 hours. HbA1C: No results for input(s): "HGBA1C" in the last 72 hours. CBG: No results for input(s): "GLUCAP" in the last 168 hours.  Lipid Profile: No results for input(s): "CHOL", "HDL", "LDLCALC", "TRIG", "CHOLHDL", "LDLDIRECT" in the last 72 hours. Thyroid Function Tests: No results for input(s): "TSH", "T4TOTAL", "FREET4", "T3FREE", "THYROIDAB" in the last 72 hours. Anemia Panel: No results for input(s): "VITAMINB12", "FOLATE", "FERRITIN", "TIBC", "IRON", "RETICCTPCT" in the last 72 hours. Sepsis Labs: No results for input(s): "PROCALCITON", "LATICACIDVEN" in the last 168 hours.   Recent Results (from the past 240 hour(s))  Blood Culture (routine x 2)     Status: None   Collection Time: 08/20/21  5:11 PM   Specimen: BLOOD  Result Value Ref Range Status   Specimen Description BLOOD BLOOD LEFT HAND  Final   Special Requests   Final    BOTTLES DRAWN AEROBIC AND ANAEROBIC Blood Culture adequate volume   Culture   Final    NO GROWTH 5 DAYS Performed at [facility], [address] [place], [place] [address]    Report Status 08/25/2021 FINAL  Final  Blood Culture (routine x 2)     Status: None   Collection Time: 08/20/21  5:16 PM   Specimen: BLOOD  Result Value Ref Range Status   Specimen Description BLOOD LEFT ANTECUBITAL  Final   Special Requests   Final    BOTTLES DRAWN AEROBIC AND ANAEROBIC Blood Culture adequate volume   Culture   Final    NO GROWTH 5 DAYS Performed at [facility], [address] [place], [place] [address]    Report Status 08/25/2021 FINAL  Final  MRSA Next Gen by PCR, Nasal     Status: None   Collection Time: 08/21/21  1:22 AM   Specimen: Nasal Mucosa; Nasal Swab  Result Value Ref Range Status   MRSA by PCR Next Gen NOT DETECTED NOT DETECTED Final    Comment: (NOTE) The  GeneXpert MRSA Assay (FDA approved for NASAL specimens only), is one component of a comprehensive MRSA colonization surveillance program. It is not intended to diagnose MRSA infection nor to guide or monitor treatment for MRSA infections. Test performance is not FDA approved in patients less than 78 years old. Performed at [facility], [address] [place],  [address]   Gastrointestinal Panel by PCR , Stool     Status: None   Collection Time: 08/21/21  2:50 AM   Specimen: Stool  Result Value Ref  Range Status   Campylobacter species NOT DETECTED NOT DETECTED Final   Plesimonas shigelloides NOT DETECTED NOT DETECTED Final   Salmonella species NOT DETECTED NOT DETECTED Final   Yersinia enterocolitica NOT DETECTED NOT DETECTED Final   Vibrio species NOT DETECTED NOT DETECTED Final   Vibrio cholerae NOT DETECTED NOT DETECTED Final   Enteroaggregative E coli (EAEC) NOT DETECTED NOT DETECTED Final   Enteropathogenic E coli (EPEC) NOT DETECTED NOT DETECTED Final   Enterotoxigenic E coli (ETEC) NOT DETECTED NOT DETECTED Final   Shiga like toxin producing E coli (STEC) NOT DETECTED NOT DETECTED Final   Shigella/Enteroinvasive E coli (EIEC) NOT DETECTED NOT DETECTED Final   Cryptosporidium NOT DETECTED NOT DETECTED Final   Cyclospora cayetanensis NOT DETECTED NOT DETECTED Final   Entamoeba histolytica NOT DETECTED NOT DETECTED Final   Giardia lamblia NOT DETECTED NOT DETECTED Final   Adenovirus F40/41 NOT DETECTED NOT DETECTED Final   Astrovirus NOT DETECTED NOT DETECTED Final   Norovirus GI/GII NOT DETECTED NOT DETECTED Final   Rotavirus A NOT DETECTED NOT DETECTED Final   Sapovirus (I, II, IV, and V) NOT DETECTED NOT DETECTED Final    Comment: Performed at [facility], [address], [place], [place] [address]  Urine Culture     Status: None   Collection Time: 08/21/21  3:02 AM   Specimen: Urine, Catheterized  Result Value Ref Range Status   Specimen  Description   Final    URINE, CATHETERIZED Performed at [facility], [address] [place], [place] [address]    Special Requests   Final    NONE Performed at [facility], [address], [place], [place] [address]    Culture   Final    NO GROWTH Performed at [facility], [address] [place], [place] [address]    Report Status 08/22/2021 FINAL  Final  C Difficile Quick Screen w PCR reflex     Status: None   Collection Time: 08/21/21 10:13 AM   Specimen: STOOL  Result Value Ref Range Status   C Diff antigen NEGATIVE NEGATIVE Final   C Diff toxin NEGATIVE NEGATIVE Final   C Diff interpretation No C. difficile detected.  Final    Comment: Performed at [facility], [address] [place], [place] [address]  Aerobic/Anaerobic Culture w Gram Stain (surgical/deep wound)     Status: None (Preliminary result)   Collection Time: 08/24/21  2:23 PM   Specimen: Abscess  Result Value Ref Range Status   Specimen Description   Final    ABSCESS Performed at [facility], [address] [place], [place] [address]    Special Requests   Final    NONE Performed at [facility], [address] [place], [place] [address]    Gram Stain NO ORGANISMS SEEN  Final   Culture   Final    NO GROWTH 4 DAYS NO ANAEROBES ISOLATED; CULTURE IN PROGRESS FOR 5 DAYS Performed at [facility], [place] [address] [place],  [address]    Report Status PENDING  Incomplete         Radiology Studies: No results found.      Scheduled Meds:  budesonide (PULMICORT) nebulizer solution  0.25 mg Nebulization BID   Chlorhexidine Gluconate Cloth  6 each Topical Q0600   enoxaparin (LOVENOX) injection  40 mg Subcutaneous Q24H   fludrocortisone  0.1 mg Oral Daily   hydrocortisone sod succinate (SOLU-CORTEF) inj  50 mg  Intravenous Daily   melatonin  5 mg Oral QHS   midodrine  10 mg Oral TID WC   multivitamin with minerals  1 tablet Oral  Daily   potassium chloride  40 mEq Oral BID   Continuous Infusions:  sodium chloride       LOS: 8 days    Time spent: 33 mins     Wyvonnia Dusky, MD [facility] Pager [phone]  If 7PM-7AM, please contact night-coverage [email or web address] 08/29/2021, 1:07 PM

## 2021-08-29 NOTE — Progress Notes (Signed)
Physical Therapy Treatment Patient Details Name: Brianna Briggs MRN: [id number] DOB: [birth year] Today's Date: 08/29/2021   History of Present Illness Pt is a 71 y/o F admitted on 08/20/21 after presenting to the ED with c/c of intermittent abdominal pain with associated N/V & generalized weakness. Pt also found to be hypotensive. Pt was admitted to the ICU for tx of sepsis 2/2 suspected intraabdominal source & possible aspiration PNA. Pt is s/p CT guided aspiration of abdominal abscess on 08/25/21. PMH: chronic respiratory failure with hypoxia, covid 19 infection, MDD, HLD, tobacco abuse, COPD, R breast CA s/p mastectomy, CAD, PAD, solitary kidney, recurrent diverticulitis with perforation & abscess s/p partial colectomy with anastomosis & loop ileostomy creation 07/31/21    PT Comments    Pt seen for PT tx with pt agreeable to tx. Pt is able to complete bed mobility with mod I & STS with supervision. Pt is able to ambulate increased distances today with RW & supervision without c/o dizziness/lightheadedness. Pt reports she has a single step up onto the porch then threshold to enter the house. Pt negotiates 1 step with LUE HHA & RUE on wall to simulate pt holding to the side of the house, with pt able to complete with min assist. After negotiating stairs & walking more, pt reports "my heart's beating fast" & HR 142 bpm. Pt returned to sitting in recliner & HR decreased to 116 bpm. SpO2 >90% on room air but pt concerned about cough she reports is new over the last ~3 days, MD notified. At this time, pt is doing well enough to d/c home with HHPT f/u & assistance from husband (pt reports he can provide 24 hr assistance) but she is concerned with her breathing as she reports only her living room & kitchen have air conditioning -- team notified. Will continue to see pt acutely to address balance & gait with LRAD & single step negotiation.    Recommendations for follow up therapy are one component of a  multi-disciplinary discharge planning process, led by the attending physician.  Recommendations may be updated based on patient status, additional functional criteria and insurance authorization.  Follow Up Recommendations  Home health PT Can patient physically be transported by private vehicle: Yes   Assistance Recommended at Discharge Intermittent Supervision/Assistance  Patient can return home with the following A little help with bathing/dressing/bathroom;A little help with walking and/or transfers;Assistance with cooking/housework;Assist for transportation;Help with stairs or ramp for entrance   Equipment Recommendations  Rolling walker (2 wheels);BSC/3in1    Recommendations for Other Services       Precautions / Restrictions Precautions Precautions: Fall Precaution Comments: new R side colostomy bag Restrictions Weight Bearing Restrictions: No     Mobility  Bed Mobility         Supine to sit: Modified independent (Device/Increase time)          Transfers Overall transfer level: Needs assistance Equipment used: Rolling walker (2 wheels) Transfers: Sit to/from Stand Sit to Stand: Supervision                Ambulation/Gait Ambulation/Gait assistance: Supervision Gait Distance (Feet): 150 Feet Assistive device: Rolling walker (2 wheels) Gait Pattern/deviations: WFL(Within Functional Limits) Gait velocity: decreased         Stairs Stairs: Yes Stairs assistance: Min assist Stair Management: No rails (LUE HHA, R hand on wall to simulate side of house) Number of Stairs: 1     Wheelchair Mobility    Modified Rankin (Stroke Patients Only)  Balance Overall balance assessment: Needs assistance Sitting-balance support: No upper extremity supported Sitting balance-Leahy Scale: Good     Standing balance support: During functional activity, Bilateral upper extremity supported Standing balance-Leahy Scale: Good                               Cognition Arousal/Alertness: Awake/alert Behavior During Therapy: Flat affect Overall Cognitive Status: Within Functional Limits for tasks assessed                                          Exercises      General Comments General comments (skin integrity, edema, etc.): Pt on room air with SPO2 >90% at end of session, max HR 142 bpm after gait with pt reporting "my heart's beating hard", decreased to 116 bpm sitting in recliner      Pertinent Vitals/Pain Pain Assessment Pain Assessment: No/denies pain    Home Living                          Prior Function            PT Goals (current goals can now be found in the care plan section) Acute Rehab PT Goals Patient Stated Goal: go home PT Goal Formulation: With patient Time For Goal Achievement: 09/10/21 Potential to Achieve Goals: Good Progress towards PT goals: Progressing toward goals    Frequency    Min 2X/week      PT Plan Discharge plan needs to be updated    Co-evaluation              AM-PAC PT "6 Clicks" Mobility   Outcome Measure  Help needed turning from your back to your side while in a flat bed without using bedrails?: None Help needed moving from lying on your back to sitting on the side of a flat bed without using bedrails?: A Little Help needed moving to and from a bed to a chair (including a wheelchair)?: A Little Help needed standing up from a chair using your arms (e.g., wheelchair or bedside chair)?: A Little Help needed to walk in hospital room?: A Little Help needed climbing 3-5 steps with a railing? : A Little 6 Click Score: 19    End of Session Equipment Utilized During Treatment: Gait belt Activity Tolerance: Patient tolerated treatment well;Treatment limited secondary to medical complications (Comment) Patient left: in chair;with chair alarm set;with call bell/phone within reach Nurse Communication:  (HR, pt concerned about new cough) PT Visit  Diagnosis: Unsteadiness on feet (R26.81);Muscle weakness (generalized) (M62.81);Difficulty in walking, not elsewhere classified (R26.2)     Time: 8119-1478 PT Time Calculation (min) (ACUTE ONLY): 14 min  Charges:  $Therapeutic Activity: 8-22 mins                     Lavone Nian, PT, DPT 08/29/21, 3:36 PM   Waunita Schooner 08/29/2021, 3:33 PM

## 2021-08-29 NOTE — Progress Notes (Signed)
Daily Progress Note   Patient Name: Brianna Briggs       Date: 08/29/2021 DOB: [birth year]  Age: 71 y.o. MRN#: [id number] Attending Physician: Wyvonnia Dusky, MD Primary Care Physician: Kirk Ruths, MD Admit Date: 08/20/2021  Reason for Consultation/Follow-up: Establishing goals of care  Subjective: Notes reviewed. In to see patient as she is out of ICU. She is having difficulty with the hospitalization and voices frustration with the process over the past couple of months. She discusses this in detail and describes it as "a hamster wheel".   We discuss care moving forward. She is unsure of her wishes regarding returning to rehab and she is concerned about current covid rise and that the amount of PT/OT is not very much out of the entire day. She is happy her intake has improved and she is gaining weight.   Husband enters and also voices frustration with the home to ED and hospital to rehab sequence. He states he wants to be sure she is ready for D/C before they leave.   Plans for ID to see patient. Will continue to follow.   Length of Stay: 8  Current Medications: Scheduled Meds:   budesonide (PULMICORT) nebulizer solution  0.25 mg Nebulization BID   Chlorhexidine Gluconate Cloth  6 each Topical Q0600   enoxaparin (LOVENOX) injection  40 mg Subcutaneous Q24H   fludrocortisone  0.1 mg Oral Daily   hydrocortisone sod succinate (SOLU-CORTEF) inj  50 mg Intravenous Daily   ipratropium-albuterol  3 mL Nebulization TID   melatonin  5 mg Oral QHS   midodrine  10 mg Oral TID WC   multivitamin with minerals  1 tablet Oral Daily   potassium chloride  40 mEq Oral BID    Continuous Infusions:  sodium chloride      PRN Meds: acetaminophen, atropine, docusate sodium,  HYDROcodone-acetaminophen, ipratropium-albuterol, loperamide, morphine injection, nicotine, ondansetron (ZOFRAN) IV, phenol, polyethylene glycol  Physical Exam Pulmonary:     Effort: Pulmonary effort is normal.  Neurological:     Mental Status: She is alert.             Vital Signs: BP 102/65 (BP Location: Left Arm)   Pulse 84   Temp 97.6 F (36.4 C)   Resp 16   Ht '5\' 2"'$  (1.575 m)   Wt  51.5 kg   SpO2 92%   BMI 20.78 kg/m  SpO2: SpO2: 92 % O2 Device: O2 Device: Room Air O2 Flow Rate: O2 Flow Rate (L/min): 2 L/min  Intake/output summary:  Intake/Output Summary (Last 24 hours) at 08/29/2021 1023 Last data filed at 08/29/2021 0300 Gross per 24 hour  Intake --  Output 650 ml  Net -650 ml   LBM: Last BM Date : 08/28/21 Baseline Weight: Weight: 50.2 kg Most recent weight: Weight: 51.5 kg        Flowsheet Rows    Flowsheet Row Most Recent Value  Intake Tab   Referral Department Hospitalist  Unit at Time of Referral ICU  Palliative Care Primary Diagnosis Sepsis/Infectious Disease  Date Notified 08/21/21  Palliative Care Type New Palliative care  Reason for referral Clarify Goals of Care  Date of Admission 08/20/21  Date first seen by Palliative Care 08/22/21  # of days Palliative referral response time 1 Day(s)  # of days IP prior to Palliative referral 1  Clinical Assessment   Palliative Performance Scale Score 40%  Pain Max last 24 hours Not able to report  Pain Min Last 24 hours Not able to report  Dyspnea Max Last 24 Hours Not able to report  Dyspnea Min Last 24 hours Not able to report  Psychosocial & Spiritual Assessment   Palliative Care Outcomes        Patient Active Problem List   Diagnosis Date Noted   Intra-abdominal abscess ([facility])    Severe sepsis with lactic acidosis ([facility]) 08/21/2021   Back pain 08/16/2021   Hypokalemia 08/11/2021   Hypophosphatemia 08/11/2021   COVID-19 virus infection 08/05/2021   MDD (major depressive disorder) 08/05/2021    Perforated diverticulum of intestine 07/28/2021   Malnutrition of moderate degree 07/28/2021   Diverticulitis of intestine with abscess 07/27/2021   Abdominal pain 07/27/2021   Acute on chronic respiratory failure with hypoxia (HCC)    Hypotension    Colonic diverticular abscess 07/19/2021   Chronic obstructive pulmonary disease (COPD) ([facility]) 07/19/2021   Diverticulitis large intestine 06/06/2021   Diverticulitis of large intestine with abscess without bleeding 07/17/2019   Chronic eczema 08/30/2015   Health care maintenance 03/01/2015   Simple chronic bronchitis ([place]) 03/01/2015   Shortness of breath 12/16/2014   Hyperlipidemia, mixed 06/04/2014   Hypercholesterolemia 02/26/2014   PVC (premature ventricular contraction) 09/04/2013   Tobacco abuse 09/04/2013    Palliative Care Assessment & Plan   Recommendations/Plan: PMT will follow as patient is having difficulty with upcoming decisions.  Code Status:    Code Status Orders  (From admission, onward)           Start     Ordered   08/21/21 0021  Full code  Continuous        08/21/21 0021           Code Status History     Date Active Date Inactive Code Status Order ID Comments User Context   07/27/2021 1717 08/16/2021 2101 Full Code [id number]  Criss Alvine, DO ED   07/19/2021 0401 07/25/2021 2156 Full Code [id number]  Mansy, Arvella Merles, MD ED   06/06/2021 2117 06/11/2021 2345 Full Code [id number]  Benjamine Sprague, DO ED   07/17/2019 0558 07/23/2019 2003 Full Code [id number]  Olean Ree, MD ED   06/30/2019 1457 07/04/2019 1901 Full Code [id number]  Tylene Fantasia, PA-C ED      Advance Directive Documentation    Flowsheet Row Most Recent Value  Type of  Scientist, forensic Power of Attorney  Pre-existing out of facility DNR order (yellow form or pink MOST form) --  "MOST" Form in Place? --    Thank you for allowing the Palliative Medicine Team to assist in the care of this patient.   Asencion Gowda, NP  Please  contact Palliative Medicine Team phone at [phone] for questions and concerns.

## 2021-08-29 NOTE — Progress Notes (Signed)
Patient ID: Brianna Briggs, female   DOB: February 02, 1950, 71 y.o.   MRN: [id number]     [facility] Day(s): 8.   Interval History: Patient seen and examined, no acute events or new complaints overnight. Patient reports to feel like she is going crazy.  Patient started to get a little bit anxious about being in the hospital.  She has been in and out of the hospital for the last 31-month  Otherwise she denies abdominal pain.  She denies any nausea or vomiting.  She endorses having regular ileostomy output.  Tolerating diet.  Vital signs in last 24 hours: [min-max] current  Temp:  [97.5 F (36.4 C)-98.4 F (36.9 C)] 98.1 F (36.7 C) (08/14 1206) Pulse Rate:  [37-94] 37 (08/14 1206) Resp:  [16-18] 16 (08/14 1206) BP: (101-157)/(51-96) 101/64 (08/14 1206) SpO2:  [92 %-98 %] 93 % (08/14 1206) Weight:  [51.5 kg] 51.5 kg (08/14 0500)     Height: '5\' 2"'$  (157.5 cm) Weight: 51.5 kg BMI (Calculated): 20.77   Physical Exam:  Constitutional: alert, cooperative and no distress  Respiratory: breathing non-labored at rest  Cardiovascular: regular rate and sinus rhythm  Gastrointestinal: soft, non-tender, and non-distended  Labs:     Latest Ref Rng & Units 08/29/2021    9:30 AM 08/28/2021    5:10 AM 08/27/2021    3:44 AM  CBC  WBC 4.0 - 10.5 K/uL 7.2  6.4  5.8   Hemoglobin 12.0 - 15.0 g/dL 10.1  9.5  9.6   Hematocrit 36.0 - 46.0 % 32.1  30.1  30.5   Platelets 150 - 400 K/uL 165  182  168       Latest Ref Rng & Units 08/29/2021    9:30 AM 08/28/2021    5:10 AM 08/27/2021    3:44 AM  CMP  Glucose 70 - 99 mg/dL 95  115  121   BUN 8 - 23 mg/dL '14  14  12   '$ Creatinine 0.44 - 1.00 mg/dL 0.59  0.64  0.69   Sodium 135 - 145 mmol/L 140  141  140   Potassium 3.5 - 5.1 mmol/L 3.1  4.0  3.3   Chloride 98 - 111 mmol/L 102  104  102   CO2 22 - 32 mmol/L 31  32  30   Calcium 8.9 - 10.3 mg/dL 8.6  8.1  8.5     Imaging studies: No new pertinent imaging studies   Assessment/Plan:   71y.o. female with sepsis   Possible etiologies are small intra-abdominal abscess versus pneumonia/COPD exacerbation   -Doing well. No fever.  Patient transferred to regular floor -tolerating diet -Abdomen physical exam is benign -Agree to continue abx therapy as per primary team. -Continue regular diet. Protein supplements will help with healing.  -No surgical intervention needed -May be discharge to skilled nursing facility once medically stable. -Will continue to follow peripherally.   EArnold Long MD

## 2021-08-29 NOTE — TOC Progression Note (Signed)
Transition of Care [facility]) - Progression Note    Patient Details  Name: Gilberta Peeters MRN: [id number] Date of Birth: 19-Aug-1950  Transition of Care [facility]) CM/SW [place], LCSW Phone Number: 08/29/2021, 1:36 PM  Clinical Narrative:  Patient is aware that current bed offers are [facility] and [facility].   Expected Discharge Plan: [place] Barriers to Discharge: Continued Medical Work up  Expected Discharge Plan and Services Expected Discharge Plan: [place]   Discharge Planning Services: CM Consult Post Acute Care Choice: [place] Living arrangements for the past 2 months: Single Family Home                 DME Arranged: N/A DME Agency: NA       HH Arranged: NA           Social Determinants of Health (SDOH) Interventions    Readmission Risk Interventions    07/28/2021    4:14 PM  Readmission Risk Prevention Plan  Transportation Screening Complete  PCP or Specialist Appt within 5-7 Days Complete  Home Care Screening Complete  Medication Review (RN CM) Complete

## 2021-08-29 NOTE — Consult Note (Signed)
NAME: Brianna Briggs  DOB: Apr 17, 1950  MRN: [id number]  Date/Time: 08/29/2021 11:27 AM  REQUESTING PROVIDER: Dr/Williams Subjective:  REASON FOR CONSULT: intraabdominal abscess ? Brianna Briggs is a 71 y.o. female with a history of COPD, current smoker, rt kidney atrophy, Rt ca breast s/p mastectomy, recurrent diverticulitis and diverticular abscess recent prolonged hospital stay  7/12-8.1-23 7/16 partial colectomy with loop ileostomy for recurrent sigmoid diverticulits with abscess with failed medical management. Also treated for covid infeciton with remdesivr and steroids  8/1 [facility]  with [facility], intermittent abdominal pain, chills, N/V and low BP on the day of presentation In the ED temp 37.1, BP 83/60, Pulse 136, rr 26 WBC 15.5, Hb 13.5, PLT 247 and cr 1.23- Alb 3, AST 44, lactate 2.3, procal 0.16 CT abd showed small pelvic collections of 2.3X 2.3, 2.6X 2.2c m CTA rt middle lobe collapse with frothy debris , mucous  plugging VS aspiration Was admitted to ICU and was started on IV zosyn- IR aspirated 5 ml of purulent fluid on 08/24/21, no organsms and culture neg PT was transferred to the regular floor and I am asked to see  patient to decide whether she needs further antibiotics Pt is feeling better No pain abdomen Appetite better But she is depressed and tearful as she is weak and not able to walk well and has not had PT    Past Medical History:  Diagnosis Date   Allergic genetic state    Breast cancer, right ([place]) 1996   Mastectomy   History of chicken pox    History of colon polyps    Hyperlipidemia    Single kidney    Squamous cell carcinoma of skin 01/27/2019   Right inferior lateral thigh. KA-type    Past Surgical History:  Procedure Laterality Date   ABDOMINAL HYSTERECTOMY     COLONOSCOPY WITH PROPOFOL N/A 05/01/2019   Procedure: COLONOSCOPY WITH PROPOFOL;  Surgeon: Toledo, Benay Pike, MD;   Location: [facility] ENDOSCOPY;  Service: Gastroenterology;  Laterality: N/A;   ESOPHAGOGASTRODUODENOSCOPY (EGD) WITH PROPOFOL N/A 05/01/2019   Procedure: ESOPHAGOGASTRODUODENOSCOPY (EGD) WITH PROPOFOL;  Surgeon: Toledo, Benay Pike, MD;  Location: [facility] ENDOSCOPY;  Service: Gastroenterology;  Laterality: N/A;   IR CATHETER TUBE CHANGE  06/29/2021   IR RADIOLOGIST EVAL & MGMT  06/23/2021   IR RADIOLOGIST EVAL & MGMT  07/14/2021   LAPAROTOMY N/A 07/31/2021   Procedure: EXPLORATORY LAPAROTOMY;  Surgeon: Herbert Pun, MD;  Location: [facility] ORS;  Service: General;  Laterality: N/A;   MASTECTOMY     PARTIAL COLECTOMY N/A 07/31/2021   Procedure: [place] Hum ILEOSTOMY;  Surgeon: Herbert Pun, MD;  Location: [facility] ORS;  Service: General;  Laterality: N/A;   SHOULDER ARTHROSCOPY WITH SUBACROMIAL DECOMPRESSION, ROTATOR CUFF REPAIR AND BICEP TENDON REPAIR Left 01/13/2008   TONSILLECTOMY     TUBAL LIGATION      Social History   Socioeconomic History   Marital status: Married    Spouse name: Not on file   Number of children: Not on file   Years of education: Not on file   Highest education level: Not on file  Occupational History   Not on file  Tobacco Use   Smoking status: Every Day    Packs/day: 1.50    Years: 40.00    Total pack years: 60.00    Types: Cigarettes   Smokeless tobacco: Never  Vaping Use   Vaping Use: Never used  Substance and Sexual Activity  Alcohol use: No   Drug use: No   Sexual activity: Yes    Partners: Male  Other Topics Concern   Not on file  Social History Narrative   Lives at home with husband   Social Determinants of Health   Financial Resource Strain: Not on file  Food Insecurity: Not on file  Transportation Needs: Not on file  Physical Activity: Not on file  Stress: Not on file  Social Connections: Not on file  Intimate Partner Violence: Not on file    Family History  Problem Relation Age of Onset   Cancer Father    Kidney failure Mother     Allergies  Allergen Reactions   Amoxicillin-Pot Clavulanate Diarrhea and Rash    Other reaction(s): Unknown   Biaxin [Clarithromycin] Diarrhea    Other reaction(s): Unknown   Oxycodone Itching   Codeine Rash   Nitrofurantoin Rash   I? Current Facility-Administered Medications  Medication Dose Route Frequency Provider Last Rate Last Admin   0.9 %  sodium chloride infusion  250 mL Intravenous Continuous Ouma, Bing Neighbors, NP       acetaminophen (TYLENOL) tablet 650 mg  650 mg Oral Q6H PRN Teressa Lower, NP   650 mg at 08/21/21 1010   atropine injection 1 mg  1 mg Intravenous PRN Teressa Lower, NP       budesonide (PULMICORT) nebulizer solution 0.25 mg  0.25 mg Nebulization BID Lang Snow, NP   0.25 mg at 08/29/21 0813   Chlorhexidine Gluconate Cloth 2 % PADS 6 each  6 each Topical Q0600 Ottie Glazier, MD   6 each at 08/29/21 0802   docusate sodium (COLACE) capsule 100 mg  100 mg Oral BID PRN Lang Snow, NP       enoxaparin (LOVENOX) injection 40 mg  40 mg Subcutaneous Q24H Flora Lipps, MD   40 mg at 08/28/21 2228   fludrocortisone (FLORINEF) tablet 0.1 mg  0.1 mg Oral Daily Flora Lipps, MD   0.1 mg at 08/29/21 1032   HYDROcodone-acetaminophen (NORCO/VICODIN) 5-325 MG per tablet 1-2 tablet  1-2 tablet Oral Q6H PRN Bradly Bienenstock, NP   1 tablet at 08/27/21 1049   hydrocortisone sodium succinate (SOLU-CORTEF) 100 MG injection 50 mg  50 mg Intravenous Daily Wyvonnia Dusky, MD   50 mg at 08/29/21 1033   ipratropium-albuterol (DUONEB) 0.5-2.5 (3) MG/3ML nebulizer solution 3 mL  3 mL Nebulization Q6H PRN Lang Snow, NP       ipratropium-albuterol (DUONEB) 0.5-2.5 (3) MG/3ML nebulizer solution 3 mL  3 mL Nebulization TID Wyvonnia Dusky, MD   3 mL at 08/29/21 0086   loperamide (IMODIUM) capsule 2 mg  2 mg Oral PRN Flora Lipps, MD       melatonin tablet 5 mg  5 mg Oral QHS Lang Snow, NP   5 mg at 08/28/21 2228    midodrine (PROAMATINE) tablet 10 mg  10 mg Oral TID WC Flora Lipps, MD   10 mg at 08/29/21 1032   morphine (PF) 2 MG/ML injection 1 mg  1 mg Intravenous Q4H PRN Teressa Lower, NP   1 mg at 08/22/21 1115   multivitamin with minerals tablet 1 tablet  1 tablet Oral Daily Flora Lipps, MD   1 tablet at 08/29/21 1032   nicotine (NICOTROL) 10 MG inhaler 1 continuous puffing  1 continuous puffing Inhalation PRN Armando Reichert, MD       ondansetron (ZOFRAN) injection 4 mg  4  mg Intravenous Q6H PRN Tonye Royalty, NP   4 mg at 08/23/21 1027   phenol (CHLORASEPTIC) mouth spray 1 spray  1 spray Mouth/Throat PRN Flora Lipps, MD       polyethylene glycol (MIRALAX / GLYCOLAX) packet 17 g  17 g Oral Daily PRN Lang Snow, NP       potassium chloride SA (KLOR-CON M) CR tablet 40 mEq  40 mEq Oral BID Wyvonnia Dusky, MD   40 mEq at 08/29/21 1057     Abtx:  Anti-infectives (From admission, onward)    Start     Dose/Rate Route Frequency Ordered Stop   08/21/21 1000  piperacillin-tazobactam (ZOSYN) IVPB 3.375 g        3.375 g 12.5 mL/hr over 240 Minutes Intravenous Every 8 hours 08/21/21 0808 08/27/21 2237   08/20/21 2100  metroNIDAZOLE (FLAGYL) IVPB 500 mg        500 mg 100 mL/hr over 60 Minutes Intravenous  Once 08/20/21 2056 08/20/21 2242   08/20/21 1900  vancomycin (VANCOCIN) IVPB 1000 mg/200 mL premix        1,000 mg 200 mL/hr over 60 Minutes Intravenous  Once 08/20/21 1852 08/20/21 2150   08/20/21 1900  ceFEPIme (MAXIPIME) 2 g in sodium chloride 0.9 % 100 mL IVPB        2 g 200 mL/hr over 30 Minutes Intravenous  Once 08/20/21 1852 08/20/21 2019       REVIEW OF SYSTEMS:  Const: negative fever, negative chills, negative weight loss Eyes: negative diplopia or visual changes, negative eye pain ENT: negative coryza, negative sore throat Resp: + cough, yellow sputum,  dyspnea, smoker- until 2 months ago Cards: negative for chest pain, palpitations, lower extremity edema GU:  negative for frequency, dysuria and hematuria GI: Negative for abdominal pain, diarrhea, bleeding, constipation Skin: negative for rash and pruritus Heme: negative for easy bruising and gum/nose bleeding MS: fatigue Neurolo:negative for headaches, dizziness, vertigo, memory problems  Psych: , depression  Endocrine: negative for thyroid, diabetes Allergy/Immunology- as above ?  Objective:  VITALS:  BP 102/65 (BP Location: Left Arm)   Pulse 84   Temp 97.6 F (36.4 C)   Resp 16   Ht '5\' 2"'$  (1.575 m)   Wt 51.5 kg   SpO2 92%   BMI 20.78 kg/m   PHYSICAL EXAM:  General: Alert, cooperative, no distress, emaciated Head: Normocephalic, without obvious abnormality, atraumatic. Eyes: Conjunctivae clear, anicteric sclerae. Pupils are equal ENT Nares normal. No drainage or sinus tenderness. Lips, mucosa, and tongue normal. No Thrush Neck: Supple, symmetrical, no adenopathy, thyroid: non tender no carotid bruit and no JVD. Back: No CVA tenderness. Lungs: Clear to auscultation bilaterally. No Wheezing or Rhonchi. No rales. Heart: Regular rate and rhythm, no murmur, rub or gallop. Abdomen: Soft, non-tender,not distended. Bowel sounds normal. No masses. ileostomy Extremities: atraumatic, no cyanosis. No edema. No clubbing Skin: No rashes or lesions. Or bruising Lymph: Cervical, supraclavicular normal. Neurologic: Grossly non-focal Pertinent Labs Lab Results CBC    Component Value Date/Time   WBC 7.2 08/29/2021 0930   RBC 3.56 (L) 08/29/2021 0930   HGB 10.1 (L) 08/29/2021 0930   HCT 32.1 (L) 08/29/2021 0930   PLT 165 08/29/2021 0930   MCV 90.2 08/29/2021 0930   MCH 28.4 08/29/2021 0930   MCHC 31.5 08/29/2021 0930   RDW 17.3 (H) 08/29/2021 0930   LYMPHSABS 1.0 08/22/2021 0318   MONOABS 1.0 08/22/2021 0318   EOSABS 0.1 08/22/2021 0318   BASOSABS 0.0 08/22/2021 0318  Latest Ref Rng & Units 08/29/2021    9:30 AM 08/28/2021    5:10 AM 08/27/2021    3:44 AM  CMP  Glucose 70  - 99 mg/dL 95  115  121   BUN 8 - 23 mg/dL '14  14  12   '$ Creatinine 0.44 - 1.00 mg/dL 0.59  0.64  0.69   Sodium 135 - 145 mmol/L 140  141  140   Potassium 3.5 - 5.1 mmol/L 3.1  4.0  3.3   Chloride 98 - 111 mmol/L 102  104  102   CO2 22 - 32 mmol/L 31  32  30   Calcium 8.9 - 10.3 mg/dL 8.6  8.1  8.5       Microbiology: Recent Results (from the past 240 hour(s))  Blood Culture (routine x 2)     Status: None   Collection Time: 08/20/21  5:11 PM   Specimen: BLOOD  Result Value Ref Range Status   Specimen Description BLOOD BLOOD LEFT HAND  Final   Special Requests   Final    BOTTLES DRAWN AEROBIC AND ANAEROBIC Blood Culture adequate volume   Culture   Final    NO GROWTH 5 DAYS Performed at  [facility], [address] [place], [place] [address]    Report Status 08/25/2021 FINAL  Final  Blood Culture (routine x 2)     Status: None   Collection Time: 08/20/21  5:16 PM   Specimen: BLOOD  Result Value Ref Range Status   Specimen Description BLOOD LEFT ANTECUBITAL  Final   Special Requests   Final    BOTTLES DRAWN AEROBIC AND ANAEROBIC Blood Culture adequate volume   Culture   Final    NO GROWTH 5 DAYS Performed at [facility], [address] [place], [place] [address]    Report Status 08/25/2021 FINAL  Final  MRSA Next Gen by PCR, Nasal     Status: None   Collection Time: 08/21/21  1:22 AM   Specimen: Nasal Mucosa; Nasal Swab  Result Value Ref Range Status   MRSA by PCR Next Gen NOT DETECTED NOT DETECTED Final    Comment: (NOTE) The GeneXpert MRSA Assay (FDA approved for NASAL specimens only), is one component of a comprehensive MRSA colonization surveillance program. It is not intended to diagnose MRSA infection nor to guide or monitor treatment for MRSA infections. Test performance is not FDA approved in patients less than 88 years old. Performed at [facility], [address] [place], [place] [address]   Gastrointestinal Panel by PCR ,  Stool     Status: None   Collection Time: 08/21/21  2:50 AM   Specimen: Stool  Result Value Ref Range Status   Campylobacter species NOT DETECTED NOT DETECTED Final   Plesimonas shigelloides NOT DETECTED NOT DETECTED Final   Salmonella species NOT DETECTED NOT DETECTED Final   Yersinia enterocolitica NOT DETECTED NOT DETECTED Final   Vibrio species NOT DETECTED NOT DETECTED Final   Vibrio cholerae NOT DETECTED NOT DETECTED Final   Enteroaggregative E coli (EAEC) NOT DETECTED NOT DETECTED Final   Enteropathogenic E coli (EPEC) NOT DETECTED NOT DETECTED Final   Enterotoxigenic E coli (ETEC) NOT DETECTED NOT DETECTED Final   Shiga like toxin producing E coli (STEC) NOT DETECTED NOT DETECTED Final   Shigella/Enteroinvasive E coli (EIEC) NOT DETECTED NOT DETECTED Final   Cryptosporidium NOT DETECTED NOT DETECTED Final   Cyclospora cayetanensis NOT DETECTED NOT DETECTED Final   Entamoeba histolytica NOT  DETECTED NOT DETECTED Final   Giardia lamblia NOT DETECTED NOT DETECTED Final   Adenovirus F40/41 NOT DETECTED NOT DETECTED Final   Astrovirus NOT DETECTED NOT DETECTED Final   Norovirus GI/GII NOT DETECTED NOT DETECTED Final   Rotavirus A NOT DETECTED NOT DETECTED Final   Sapovirus (I, II, IV, and V) NOT DETECTED NOT DETECTED Final    Comment: Performed at [facility], [address], [place], [place] [address]  Urine Culture     Status: None   Collection Time: 08/21/21  3:02 AM   Specimen: Urine, Catheterized  Result Value Ref Range Status   Specimen Description   Final    URINE, CATHETERIZED Performed at [facility], [address], [place], [place] [address]    Special Requests   Final    NONE Performed at [facility], [address] [place], [place] [address]    Culture   Final    NO GROWTH Performed at [facility], [place] [address] [place], [place] [address]    Report Status 08/22/2021 FINAL  Final  C Difficile Quick Screen w PCR reflex      Status: None   Collection Time: 08/21/21 10:13 AM   Specimen: STOOL  Result Value Ref Range Status   C Diff antigen NEGATIVE NEGATIVE Final   C Diff toxin NEGATIVE NEGATIVE Final   C Diff interpretation No C. difficile detected.  Final    Comment: Performed at [facility], [address] [place], [place] [address]  Aerobic/Anaerobic Culture w Gram Stain (surgical/deep wound)     Status: None (Preliminary result)   Collection Time: 08/24/21  2:23 PM   Specimen: Abscess  Result Value Ref Range Status   Specimen Description   Final    ABSCESS Performed at [facility], [address], [place], [place] [address]    Special Requests   Final    NONE Performed at [facility], [address] [place], [place] [address]    Gram Stain NO ORGANISMS SEEN  Final   Culture   Final    NO GROWTH 4 DAYS NO ANAEROBES ISOLATED; CULTURE IN PROGRESS FOR 5 DAYS Performed at [facility], [place] [address], [place], [place] [address]    Report Status PENDING  Incomplete    IMAGING RESULTS: 2.1 cm X 1.6 cm lower pelvic mesentery collections x 2  I have personally reviewed the films ? Impression/Recommendation Recurrent diverticulitis with diverticular abscess not responding to antibiotics alone had 7/16 partial hemicolectomy and loop ileostomy  Hypotension on admission-thought to e sepsis- source was likely lung ( aspiration pneumonia VS mucous pugs) rather than intra abdominal abscess as the post op collection was very small and aspiration of the collection yielded only 5 cc of fluid and the culture was negative Pt has received 7 days of IV zosyn Does not need any more- no fever, normal white count Will need incentive spirometry Ambulation  Depressed and tearful- may benefit from psych consult  COPD  Smoker  until 2 months ago   ___________________________________________________ Discussed with patient, requesting provider ID will sign off- call if  needed Note:  This document was prepared using Dragon voice recognition software and may include unintentional dictation errors.

## 2021-08-30 DIAGNOSIS — E8809 Other disorders of plasma-protein metabolism, not elsewhere classified: Secondary | ICD-10-CM | POA: Diagnosis not present

## 2021-08-30 DIAGNOSIS — R6520 Severe sepsis without septic shock: Secondary | ICD-10-CM | POA: Diagnosis not present

## 2021-08-30 DIAGNOSIS — Z7189 Other specified counseling: Secondary | ICD-10-CM | POA: Diagnosis not present

## 2021-08-30 DIAGNOSIS — A419 Sepsis, unspecified organism: Secondary | ICD-10-CM | POA: Diagnosis not present

## 2021-08-30 DIAGNOSIS — E872 Acidosis, unspecified: Secondary | ICD-10-CM | POA: Diagnosis not present

## 2021-08-30 DIAGNOSIS — K651 Peritoneal abscess: Secondary | ICD-10-CM | POA: Diagnosis not present

## 2021-08-30 DIAGNOSIS — J9611 Chronic respiratory failure with hypoxia: Secondary | ICD-10-CM | POA: Diagnosis not present

## 2021-08-30 LAB — BASIC METABOLIC PANEL
Anion gap: 6 (ref 5–15)
BUN: 16 mg/dL (ref 8–23)
CO2: 30 mmol/L (ref 22–32)
Calcium: 8.4 mg/dL — ABNORMAL LOW (ref 8.9–10.3)
Chloride: 102 mmol/L (ref 98–111)
Creatinine, Ser: 0.62 mg/dL (ref 0.44–1.00)
GFR, Estimated: >60 mL/min (ref >60)
Glucose, Bld: 85 mg/dL (ref 70–99)
Potassium: 4.2 mmol/L (ref 3.5–5.1)
Sodium: 138 mmol/L (ref 135–145)

## 2021-08-30 LAB — CBC
HCT: 29.9 % — ABNORMAL LOW (ref 36.0–46.0)
Hemoglobin: 9.6 g/dL — ABNORMAL LOW (ref 12.0–15.0)
MCH: 28.8 pg (ref 26.0–34.0)
MCHC: 32.1 g/dL (ref 30.0–36.0)
MCV: 89.8 fL (ref 80.0–100.0)
Platelets: 176 10*3/uL (ref 150–400)
RBC: 3.33 MIL/uL — ABNORMAL LOW (ref 3.87–5.11)
RDW: 17.9 % — ABNORMAL HIGH (ref 11.5–15.5)
WBC: 9.2 10*3/uL (ref 4.0–10.5)
nRBC: 0 % (ref 0.0–0.2)

## 2021-08-30 LAB — MAGNESIUM: Magnesium: 1.8 mg/dL (ref 1.7–2.4)

## 2021-08-30 NOTE — Progress Notes (Addendum)
Daily Progress Note   Patient Name: Brianna Briggs       Date: 08/30/2021 DOB: 01/30/1950  Age: 71 y.o. MRN#: [id number] Attending Physician: Wyvonnia Dusky, MD Primary Care Physician: Kirk Ruths, MD Admit Date: 08/20/2021  Reason for Consultation/Follow-up: Establishing goals of care  Subjective: Notes reviewed. In to see patient; husband is at bedside. TOC is at bedside. Discussion of home with home health. Discussed ID evaluation yesterday. Discussed pulmonary toileting, and the importance of this in detail- patient is able to pull 1,200 currently. Discussed wound-ostomy nurse teaching and patient autonomy. Would recommend outpatient palliative to folllow.   Length of Stay: 9  Current Medications: Scheduled Meds:   budesonide (PULMICORT) nebulizer solution  0.25 mg Nebulization BID   Chlorhexidine Gluconate Cloth  6 each Topical Q0600   enoxaparin (LOVENOX) injection  40 mg Subcutaneous Q24H   fludrocortisone  0.1 mg Oral Daily   ipratropium-albuterol  3 mL Nebulization QID   melatonin  5 mg Oral QHS   midodrine  10 mg Oral TID WC   multivitamin with minerals  1 tablet Oral Daily    Continuous Infusions:  sodium chloride      PRN Meds: acetaminophen, atropine, docusate sodium, HYDROcodone-acetaminophen, ipratropium-albuterol, loperamide, morphine injection, nicotine, ondansetron (ZOFRAN) IV, phenol, polyethylene glycol  Physical Exam Pulmonary:     Effort: Pulmonary effort is normal.  Neurological:     Mental Status: She is alert.             Vital Signs: BP 95/75 (BP Location: Right Arm)   Pulse 88   Temp 98.2 F (36.8 C)   Resp 14   Ht '5\' 2"'$  (1.575 m)   Wt 51.5 kg   SpO2 99%   BMI 20.78 kg/m  SpO2: SpO2: 99 % O2 Device: O2 Device: Nasal  Cannula O2 Flow Rate: O2 Flow Rate (L/min): 2 L/min  Intake/output summary:  Intake/Output Summary (Last 24 hours) at 08/30/2021 1116 Last data filed at 08/30/2021 0500 Gross per 24 hour  Intake 180 ml  Output 600 ml  Net -420 ml   LBM: Last BM Date : 08/28/21 Baseline Weight: Weight: 50.2 kg Most recent weight: Weight: 51.5 kg  Flowsheet Rows    Flowsheet Row Most Recent Value  Intake Tab   Referral Department Hospitalist  Unit at Time  of Referral ICU  Palliative Care Primary Diagnosis Sepsis/Infectious Disease  Date Notified 08/21/21  Palliative Care Type New Palliative care  Reason for referral Clarify Goals of Care  Date of Admission 08/20/21  Date first seen by Palliative Care 08/22/21  # of days Palliative referral response time 1 Day(s)  # of days IP prior to Palliative referral 1  Clinical Assessment   Palliative Performance Scale Score 40%  Pain Max last 24 hours Not able to report  Pain Min Last 24 hours Not able to report  Dyspnea Max Last 24 Hours Not able to report  Dyspnea Min Last 24 hours Not able to report  Psychosocial & Spiritual Assessment   Palliative Care Outcomes        Patient Active Problem List   Diagnosis Date Noted   Intra-abdominal abscess ([place])    Severe sepsis with lactic acidosis ([place]) 08/21/2021   Back pain 08/16/2021   Hypokalemia 08/11/2021   Hypophosphatemia 08/11/2021   COVID-19 virus infection 08/05/2021   MDD (major depressive disorder) 08/05/2021   Perforated diverticulum of intestine 07/28/2021   Malnutrition of moderate degree 07/28/2021   Diverticulitis of intestine with abscess 07/27/2021   Abdominal pain 07/27/2021   Acute on chronic respiratory failure with hypoxia (HCC)    Hypotension    Colonic diverticular abscess 07/19/2021   Chronic obstructive pulmonary disease (COPD) ([place]) 07/19/2021   Diverticulitis large intestine 06/06/2021   Diverticulitis of large intestine with abscess without bleeding 07/17/2019    Chronic eczema 08/30/2015   Health care maintenance 03/01/2015   Simple chronic bronchitis ([place]) 03/01/2015   Shortness of breath 12/16/2014   Hyperlipidemia, mixed 06/04/2014   Hypercholesterolemia 02/26/2014   PVC (premature ventricular contraction) 09/04/2013   Tobacco abuse 09/04/2013    Palliative Care Assessment & Plan     Recommendations/Plan: Recommend outpatient palliative.  Code Status:    Code Status Orders  (From admission, onward)           Start     Ordered   08/21/21 0021  Full code  Continuous        08/21/21 0021           Code Status History     Date Active Date Inactive Code Status Order ID Comments User Context   07/27/2021 1717 08/16/2021 2101 Full Code [id number]  Criss Alvine, DO ED   07/19/2021 0401 07/25/2021 2156 Full Code [id number]  Mansy, Arvella Merles, MD ED   06/06/2021 2117 06/11/2021 2345 Full Code [id number]  Benjamine Sprague, DO ED   07/17/2019 0558 07/23/2019 2003 Full Code [id number]  Olean Ree, MD ED   06/30/2019 1457 07/04/2019 1901 Full Code [id number]  Tylene Fantasia, PA-C ED      Advance Directive Documentation    Flowsheet Row Most Recent Value  Type of Advance Directive Healthcare Power of Attorney  Pre-existing out of facility DNR order (yellow form or pink MOST form) --  "MOST" Form in Place? --       Thank you for allowing the Palliative Medicine Team to assist in the care of this patient.  Asencion Gowda, NP  Please contact Palliative Medicine Team phone at [phone] for questions and concerns.

## 2021-08-30 NOTE — Progress Notes (Signed)
PROGRESS NOTE   HPI was taken from NP Ouma: 71 y.o female with significant PMH of Chronic respiratory failure with hypoxia, COVID-19 virus infection, MDD (major depressive disorder), Hyperlipidemia, mixed, Tobacco abuse, Chronic obstructive pulmonary disease (COPD) ([place]), Right Breast, Cancer s/p mastectomy, CAD, PAD, Solitary Kidney, recurrent Diverticulitis w/perforation and abscess s/p partial colectomy with anastomosis and loop ileostomy creation 07/31/21 who presented to the ED with chief complaints of intermittent abdominal pain associated with n/v and generalized weakness.   Patient was recently admitted to the hospital from 7/12-8/1 with epigastric pain. Her prior CT abd/pelvis showed persistent abscess attributed to acute sigmoid diverticulitis. She had initially been treated with percutaneous drainage and IV abx but failed and ultimately underwent sigmoid resection with anastomosis and loop ileostomy creation on 7/16. She was also found to have COVID infection and was treated  with remdesevir and steroids. She was discharged to SNF for short term rehab. Since discharge patient report decreased po intake, intermittent abdominal pain, chills, nausea and vomiting. Staff at the SNF noticed that her bp was very low so sent her to the ED for further evaluation.   ED Course: In the emergency department, the temperature was 37.1C, the heart rate136  beats/minute, the blood 83/60 pressure  mm Hg, the respiratory rate 26  breaths/minute, and the oxygen saturation 95% on 2L  As per Dr. Genia Harold: 8/6: Admitted to ICU w/sepsis due to suspected intraabdominal source and possible aspiration pneumonia 8/7 on pressors, being weaned off slowly 8/8 on pressors, s/p fluid drainage 8/10 REMAINS ON PRESSORS 8/11 weaned off vasopressors overnight  As per Dr. Jimmye Norman 8/12-8/15/23: Pt completed the 7 day course of IV zosyn for diverticular abscess s/p drainage. PT/OT initially evaluated pt and recommended SNF. On  re-evaluation, PT/OT recs home health. HH orders placed   Brianna Briggs  HFW:[id number] DOB: 11/25/1950 DOA: 08/20/2021 PCP: Kirk Ruths, MD   Assessment & Plan:   Principal Problem:   Severe sepsis with lactic acidosis [facility]) Active Problems:   Intra-abdominal abscess (HCC)  Assessment and Plan: Septic shock: please Dr. Gretta Began note on how pt met septic shock criteria. Likely secondary to diverticular abscess s/p drainage. Completed 7 day course of zosyn but will consult ID tomorrow. Completed stress dose steroid taper. Continue on florinef. Septic shock resolved  Diverticular abscess: s/p drainage. Completed 7 day course of IV zosyn and no further abxs are needed as per ID. Hx of partial colectomy w/ anastomosis & loop ileostomy creation on 07/31/21 for diverticulitis w/ perforation & abscess. Pt and pt's husband have ostomy questions, so ostomy nurse consult was placed   Chronic hypoxic respiratory failure: likely secondary to COPD but only was using supplemental oxygen at night only. Continue on supplemental oxygen at night only   COPD: continue on bronchodilators & encourage incentive spirometry  AKI: resolved  Hypokalemia: WNL today   Hypomagnesemia: WNL today   Hypoalbuminemia: encourage po intake     DVT prophylaxis: lovenox  Code Status: full  Family Communication: discussed pt's care w/ pt's husband and answered his questions  Disposition Plan: PT/OT recs HH now   Level of care: Progressive  Status is: Inpatient Remains inpatient appropriate because: severity of illness    Consultants:  ICU General surg   Procedures:   Antimicrobials:    Subjective: Pt is still feeling overwhelmed   Objective: Vitals:   08/29/21 2205 08/29/21 2332 08/30/21 0338 08/30/21 0745  BP:  1'11/78 91/64 95/75 '  Pulse: (!) 118 (!) 101 88 88  Resp:  19 14  Temp:  98.5 F (36.9 C) 98.2 F (36.8 C) 98.2 F (36.8 C)  TempSrc:  Oral Oral   SpO2: 93% 96% 99% 99%   Weight:      Height:        Intake/Output Summary (Last 24 hours) at 08/30/2021 0818 Last data filed at 08/30/2021 0500 Gross per 24 hour  Intake 180 ml  Output 850 ml  Net -670 ml   Filed Weights   08/26/21 0600 08/28/21 0600 08/29/21 0500  Weight: 50.3 kg 50.4 kg 51.5 kg    Examination:  General exam: Appears calm but uncomfortable  Respiratory system: clear breath sounds b/l   Cardiovascular system: S1/S2+. No rubs or clicks  Gastrointestinal system: Abd is soft, NT, ostomy bag present, normal bowel sounds  Central nervous system: Alert and oriented. Moves all extremities    Psychiatry: Judgement and insight appears normal. Flat mood and affect     Data Reviewed: I have personally reviewed following labs and imaging studies  CBC: Recent Labs  Lab 08/26/21 0550 08/27/21 0344 08/28/21 0510 08/29/21 0930 08/30/21 0458  WBC 6.8 5.8 6.4 7.2 9.2  HGB 9.5* 9.6* 9.5* 10.1* 9.6*  HCT 30.9* 30.5* 30.1* 32.1* 29.9*  MCV 91.2 90.8 90.7 90.2 89.8  PLT 175 168 182 165 413   Basic Metabolic Panel: Recent Labs  Lab 08/23/21 1055 08/24/21 0216 08/24/21 1626 08/25/21 0440 08/26/21 0550 08/27/21 0344 08/28/21 0510 08/29/21 0930 08/30/21 0458  NA 126* 139  --  142 141 140 141 140 138  K 3.5 3.2*   < > 4.0 3.5 3.3* 4.0 3.1* 4.2  CL 86* 96*  --  99 100 102 104 102 102  CO2 30 32  --  31 33* 30 32 31 30  GLUCOSE 143* 142*  --  133* 125* 121* 115* 95 85  BUN <5* 6*  --  '8 14 12 14 14 16  ' CREATININE 0.68 0.85  --  0.82 0.79 0.69 0.64 0.59 0.62  CALCIUM 8.1* 8.3*  --  8.4* 8.6* 8.5* 8.1* 8.6* 8.4*  MG 2.4 2.2  --  2.1  --  1.5* 2.2  --  1.8  PHOS 2.1* 3.1  --  3.7  --  2.6 3.0  --   --    < > = values in this interval not displayed.   GFR: Estimated Creatinine Clearance: 51 mL/min (by C-G formula based on SCr of 0.62 mg/dL). Liver Function Tests: Recent Labs  Lab 08/24/21 0216  AST 21  ALT 14  ALKPHOS 81  BILITOT 0.7  PROT 6.3*  ALBUMIN 2.7*   No results for  input(s): "LIPASE", "AMYLASE" in the last 168 hours. No results for input(s): "AMMONIA" in the last 168 hours. Coagulation Profile: No results for input(s): "INR", "PROTIME" in the last 168 hours.  Cardiac Enzymes: No results for input(s): "CKTOTAL", "CKMB", "CKMBINDEX", "TROPONINI" in the last 168 hours. BNP (last 3 results) No results for input(s): "PROBNP" in the last 8760 hours. HbA1C: No results for input(s): "HGBA1C" in the last 72 hours. CBG: No results for input(s): "GLUCAP" in the last 168 hours.  Lipid Profile: No results for input(s): "CHOL", "HDL", "LDLCALC", "TRIG", "CHOLHDL", "LDLDIRECT" in the last 72 hours. Thyroid Function Tests: No results for input(s): "TSH", "T4TOTAL", "FREET4", "T3FREE", "THYROIDAB" in the last 72 hours. Anemia Panel: No results for input(s): "VITAMINB12", "FOLATE", "FERRITIN", "TIBC", "IRON", "RETICCTPCT" in the last 72 hours. Sepsis Labs: No results for input(s): "PROCALCITON", "LATICACIDVEN" in the last 168  hours.   Recent Results (from the past 240 hour(s))  Blood Culture (routine x 2)     Status: None   Collection Time: 08/20/21  5:11 PM   Specimen: BLOOD  Result Value Ref Range Status   Specimen Description BLOOD BLOOD LEFT HAND  Final   Special Requests   Final    BOTTLES DRAWN AEROBIC AND ANAEROBIC Blood Culture adequate volume   Culture   Final    NO GROWTH 5 DAYS Performed at [facility], [address], [place], [place] [address]    Report Status 08/25/2021 FINAL  Final  Blood Culture (routine x 2)     Status: None   Collection Time: 08/20/21  5:16 PM   Specimen: BLOOD  Result Value Ref Range Status   Specimen Description BLOOD LEFT ANTECUBITAL  Final   Special Requests   Final    BOTTLES DRAWN AEROBIC AND ANAEROBIC Blood Culture adequate volume   Culture   Final    NO GROWTH 5 DAYS Performed at [facility], [address] [place], [place] [address]    Report Status 08/25/2021 FINAL  Final  MRSA  Next Gen by PCR, Nasal     Status: None   Collection Time: 08/21/21  1:22 AM   Specimen: Nasal Mucosa; Nasal Swab  Result Value Ref Range Status   MRSA by PCR Next Gen NOT DETECTED NOT DETECTED Final    Comment: (NOTE) The GeneXpert MRSA Assay (FDA approved for NASAL specimens only), is one component of a comprehensive MRSA colonization surveillance program. It is not intended to diagnose MRSA infection nor to guide or monitor treatment for MRSA infections. Test performance is not FDA approved in patients less than 51 years old. Performed at [facility], [address] [place], [place] [address]   Gastrointestinal Panel by PCR , Stool     Status: None   Collection Time: 08/21/21  2:50 AM   Specimen: Stool  Result Value Ref Range Status   Campylobacter species NOT DETECTED NOT DETECTED Final   Plesimonas shigelloides NOT DETECTED NOT DETECTED Final   Salmonella species NOT DETECTED NOT DETECTED Final   Yersinia enterocolitica NOT DETECTED NOT DETECTED Final   Vibrio species NOT DETECTED NOT DETECTED Final   Vibrio cholerae NOT DETECTED NOT DETECTED Final   Enteroaggregative E coli (EAEC) NOT DETECTED NOT DETECTED Final   Enteropathogenic E coli (EPEC) NOT DETECTED NOT DETECTED Final   Enterotoxigenic E coli (ETEC) NOT DETECTED NOT DETECTED Final   Shiga like toxin producing E coli (STEC) NOT DETECTED NOT DETECTED Final   Shigella/Enteroinvasive E coli (EIEC) NOT DETECTED NOT DETECTED Final   Cryptosporidium NOT DETECTED NOT DETECTED Final   Cyclospora cayetanensis NOT DETECTED NOT DETECTED Final   Entamoeba histolytica NOT DETECTED NOT DETECTED Final   Giardia lamblia NOT DETECTED NOT DETECTED Final   Adenovirus F40/41 NOT DETECTED NOT DETECTED Final   Astrovirus NOT DETECTED NOT DETECTED Final   Norovirus GI/GII NOT DETECTED NOT DETECTED Final   Rotavirus A NOT DETECTED NOT DETECTED Final   Sapovirus (I, II, IV, and V) NOT DETECTED NOT DETECTED Final    Comment:  Performed at [facility], [address], [place], [place] [address]  Urine Culture     Status: None   Collection Time: 08/21/21  3:02 AM   Specimen: Urine, Catheterized  Result Value Ref Range Status   Specimen Description   Final    URINE, CATHETERIZED Performed at [facility], [place]  [address], [place], [place] [address]    Special Requests   Final    NONE Performed at [facility], [address] [place], [place] [address]    Culture   Final    NO GROWTH Performed at [facility], [place] [address], [place], [place] [address]    Report Status 08/22/2021 FINAL  Final  C Difficile Quick Screen w PCR reflex     Status: None   Collection Time: 08/21/21 10:13 AM   Specimen: STOOL  Result Value Ref Range Status   C Diff antigen NEGATIVE NEGATIVE Final   C Diff toxin NEGATIVE NEGATIVE Final   C Diff interpretation No C. difficile detected.  Final    Comment: Performed at [facility], [address] [place], [place] [address]  Aerobic/Anaerobic Culture w Gram Stain (surgical/deep wound)     Status: None   Collection Time: 08/24/21  2:23 PM   Specimen: Abscess  Result Value Ref Range Status   Specimen Description   Final    ABSCESS Performed at [facility], [address], [place], [place] [address]    Special Requests   Final    NONE Performed at [facility], [address] [place], [place] [address]    Gram Stain NO ORGANISMS SEEN  Final   Culture   Final    No growth aerobically or anaerobically. Performed at [facility], [place] [address] [place], [place] [address]    Report Status 08/29/2021 FINAL  Final         Radiology Studies: No results found.      Scheduled Meds:  budesonide (PULMICORT) nebulizer solution  0.25 mg Nebulization BID   Chlorhexidine Gluconate Cloth  6 each Topical Q0600   enoxaparin (LOVENOX) injection  40 mg Subcutaneous Q24H   fludrocortisone  0.1 mg Oral Daily    ipratropium-albuterol  3 mL Nebulization QID   melatonin  5 mg Oral QHS   midodrine  10 mg Oral TID WC   multivitamin with minerals  1 tablet Oral Daily   Continuous Infusions:  sodium chloride       LOS: 9 days    Time spent: 25 mins     Wyvonnia Dusky, MD [facility] Pager [phone]  If 7PM-7AM, please contact night-coverage [email or web address] 08/30/2021, 8:18 AM

## 2021-08-30 NOTE — TOC Progression Note (Addendum)
Transition of Care [facility]) - Progression Note    Patient Details  Name: Brianna Briggs MRN: [id number] Date of Birth: 02-12-1950  Transition of [facility]) CM/SW [place], LCSW Phone Number: 08/30/2021, 10:45 AM  Clinical Narrative:  PT now recommending home health. Discussed with patient, husband, and palliative NP. Patient last worked with [facility] and they are able to accept her back for PT, OT, RN, aide. Therapy recommending RW and 3-in-1. Patient already has RW and is agreeable to 3-in-1. Will order once dc date determined. Per chart review, patient gets her oxygen through [employer]. Representative is confirming what her orders shows.   1:40 pm: [employer] confirmed their orders show she is on continuous oxygen at home. They will work on switching out her concentrator.  2:14 pm: Ordered 3-in-1.  Expected Discharge Plan: [place] Barriers to Discharge: Continued Medical Work up  Expected Discharge Plan and Services Expected Discharge Plan: [place]   Discharge Planning Services: CM Consult Post Acute Care Choice: [place] Living arrangements for the past 2 months: Single Family Home                 DME Arranged: N/A DME Agency: NA       HH Arranged: NA           Social Determinants of Health (SDOH) Interventions    Readmission Risk Interventions    07/28/2021    4:14 PM  Readmission Risk Prevention Plan  Transportation Screening Complete  PCP or Specialist Appt within 5-7 Days Complete  Home Care Screening Complete  Medication Review (RN CM) Complete

## 2021-08-30 NOTE — Progress Notes (Signed)
   08/30/21 1300  Clinical Encounter Type  Visited With Patient  Visit Type Follow-up  Referral From Nurse  Consult/Referral To [place] responded to [facility] consult to help patient work through major life transition.

## 2021-08-30 NOTE — Progress Notes (Signed)
   08/30/21 1940  Assess: MEWS Score  Temp (!) 101.5 F (38.6 C)  BP (!) 106/56  MAP (mmHg) 71  Pulse Rate 100  Resp 18  SpO2 98 %  O2 Device Room Air  Assess: MEWS Score  MEWS Temp 2  MEWS Systolic 0  MEWS Pulse 0  MEWS RR 0  MEWS LOC 0  MEWS Score 2  MEWS Score Color Yellow  Assess: if the MEWS score is Yellow or Red  Were vital signs taken at a resting state? Yes  Focused Assessment Change from prior assessment (see assessment flowsheet)  Does the patient meet 2 or more of the SIRS criteria? Yes  Does the patient have a confirmed or suspected source of infection? No  MEWS guidelines implemented *See Row Information* Yes  Take Vital Signs  Increase Vital Sign Frequency  Yellow: Q 2hr X 2 then Q 4hr X 2, if remains yellow, continue Q 4hrs  Escalate  MEWS: Escalate Yellow: discuss with charge nurse/RN and consider discussing with provider and RRT  Notify: Charge Nurse/RN  Name of Charge Nurse/RN Notified Holley Dexter  Date Charge Nurse/RN Notified 08/30/21  Time Charge Nurse/RN Notified 1949  Notify: Provider  Provider Name/Title Neomia Glass  Date Provider Notified 08/30/21  Time Provider Notified 1951  Method of Notification Page  Notification Reason Other (Comment) (yellow mews)  Provider response No new orders  Date of Provider Response 08/30/21  Time of Provider Response 1957  Document  Progress note created (see row info) Yes  Assess: SIRS CRITERIA  SIRS Temperature  1  SIRS Pulse 1  SIRS Respirations  0  SIRS WBC 1  SIRS Score Sum  3

## 2021-08-30 NOTE — Progress Notes (Signed)
Occupational Therapy Treatment Patient Details Name: Brianna Briggs MRN: [id number] DOB: 1950/07/24 Today's Date: 08/30/2021   History of present illness 71 y/o F admitted 08/20/21 presenting with c/c of intermittent abdominal pain with associated N/V & generalized weakness. Pt also found to be hypotensive. Pt was admitted to the ICU for tx of sepsis 2/2 suspected intraabdominal source & possible aspiration PNA. Pt is s/p CT guided aspiration of abdominal abscess on 08/25/21. PMH: chronic respiratory failure with hypoxia, covid 19 infection, MDD, HLD, tobacco abuse, COPD, R breast CA s/p mastectomy, CAD, PAD, solitary kidney, recurrent diverticulitis with perforation & abscess s/p partial colectomy with anastomosis & loop ileostomy creation 07/31/21   OT comments  Brianna Briggs was seen for OT treatment on this date. Pt received with PT in room finishing up session. Pt requests to use bathroom and is agreeable to OT taking over to facilitate ADL management. Pt educated on energy conservation strategies, falls prevention, and safe transfer techniques during functional activities as described below (see ADL section for additional detail). She requires SUPERVISION assistance for toileting, toilet transfer, and functional mobility this date. SpO2 remains WFL 88-92% t/o session with pt on RA. HR remains 110's-120's t/o session. Pt making good progress toward goals and continues to benefit from skilled OT services to maximize return to PLOF and minimize risk of future falls, injury, caregiver burden, and readmission. Will continue to follow POC. Discharge recommendation remains appropriate.     Recommendations for follow up therapy are one component of a multi-disciplinary discharge planning process, led by the attending physician.  Recommendations may be updated based on patient status, additional functional criteria and insurance authorization.    Follow Up Recommendations  Home health OT    Assistance  Recommended at Discharge Set up Supervision/Assistance  Patient can return home with the following  A little help with walking and/or transfers;A little help with bathing/dressing/bathroom;Help with stairs or ramp for entrance;Assist for transportation;Assistance with cooking/housework   Equipment Recommendations  BSC/3in1    Recommendations for Other Services      Precautions / Restrictions Precautions Precautions: Fall Precaution Comments: new R side colostomy bag Restrictions Weight Bearing Restrictions: No       Mobility Bed Mobility               General bed mobility comments: not tested, pt received sitting EOB & left sitting in recliner    Transfers Overall transfer level: Modified independent Equipment used: Rolling walker (2 wheels) Transfers: Sit to/from Stand Sit to Stand: Supervision     Step pivot transfers: Supervision     General transfer comment: cued for hand placement     Balance Overall balance assessment: Needs assistance Sitting-balance support: No upper extremity supported Sitting balance-Leahy Scale: Good Sitting balance - Comments: steady sitting reaching within BOS, no LOB appreciated with weight shift during functional activity.   Standing balance support: During functional activity, Bilateral upper extremity supported Standing balance-Leahy Scale: Good                             ADL either performed or assessed with clinical judgement   ADL Overall ADL's : Needs assistance/impaired Eating/Feeding: Set up;Sitting                       Toilet Transfer: Supervision/safety;Set up;BSC/3in1   Toileting- Clothing Manipulation and Hygiene: Set up;Supervision/safety;Sit to/from stand Toileting - Clothing Manipulation Details (indicate cue type and reason): supervision for  peri care following urinating     Functional mobility during ADLs: Supervision/safety;Cueing for safety;Rolling walker (2 wheels) General ADL  Comments: SUPERVISION for functional mobility in room. Requires some cueing for safe use of RW. SpO2 remains WFL 88-92% t/o session with pt on RA.    Extremity/Trunk Assessment Upper Extremity Assessment Upper Extremity Assessment: Generalized weakness   Lower Extremity Assessment Lower Extremity Assessment: Generalized weakness   Cervical / Trunk Assessment Cervical / Trunk Assessment: Normal    Vision Patient Visual Report: No change from baseline     Perception     Praxis      Cognition Arousal/Alertness: Awake/alert Behavior During Therapy: WFL for tasks assessed/performed Overall Cognitive Status: Within Functional Limits for tasks assessed                                          Exercises Other Exercises Other Exercises: Pt educated on energy conservation strategies, falls prevention strategies, safe use of RW and transfer techniques.    Shoulder Instructions       General Comments Pt on RA at start of session SPO2 >/= 88% t/o session. Improves with cues for PLB. HR remains 110's-120's with functional activity.    Pertinent Vitals/ Pain       Pain Assessment Pain Assessment: No/denies pain  Home Living                                          Prior Functioning/Environment              Frequency  Min 2X/week        Progress Toward Goals  OT Goals(current goals can now be found in the care plan section)  Progress towards OT goals: Progressing toward goals  Acute Rehab OT Goals Patient Stated Goal: To feel better OT Goal Formulation: With patient Time For Goal Achievement: 09/10/21 Potential to Achieve Goals: Good  Plan Frequency remains appropriate;Discharge plan needs to be updated    Co-evaluation                 AM-PAC OT "6 Clicks" Daily Activity     Outcome Measure   Help from another person eating meals?: None Help from another person taking care of personal grooming?: A Little Help from  another person toileting, which includes using toliet, bedpan, or urinal?: A Little Help from another person bathing (including washing, rinsing, drying)?: A Little Help from another person to put on and taking off regular upper body clothing?: A Little Help from another person to put on and taking off regular lower body clothing?: A Little 6 Click Score: 19    End of Session Equipment Utilized During Treatment: Gait belt;Rolling walker (2 wheels)  OT Visit Diagnosis: Unsteadiness on feet (R26.81);Muscle weakness (generalized) (M62.81);History of falling (Z91.81)   Activity Tolerance Patient tolerated treatment well   Patient Left in chair;with call bell/phone within reach;with chair alarm set   Nurse Communication          Time: 5027-7412 OT Time Calculation (min): 24 min  Charges: OT General Charges $OT Visit: 1 Visit OT Treatments $Self Care/Home Management : 23-37 mins  Shara Blazing, M.S., OTR/L Ascom: [phone] 08/30/21, 2:52 PM

## 2021-08-30 NOTE — Consult Note (Addendum)
[place] Nurse ostomy consult note Consult requested for ostomy teaching.  Pt is familiar to [facility] team from previous admission, then she was discharged to a SNF and was readmitted to the hospital 9 days ago.  The [place] team was not aware the patient had been readmitted until consult was requested today. Husband at the bedside states he participated in pouch change and teaching session previously (8/1) and feels comfortable performing these activities.  Pt was ill with Covid and weak and did not participate very much during the previous admission, and would like to review the steps again today. He states his daughter previously had an ileostomy and they are familiar with pouch changes. Pt plans to discharge home with home health instead of SNF this time, according to progress notes.  Stoma type/location: Stoma is red and viable, slightly oval from top to bottom, 1 1/4 inches, slightly above skin level.  Current pouch was leaking behind the barrier when it was removed, husband states it has been in place 7 days.  Peristomal assessment: intact skin surrounding the stoma.  Output: 250cc liquid brown stool had just been emptied before my arrival.  Ostomy pouching: 2pc.  Education provided:  Pt was able to apply barrier ring, wafer, and snap pouch into place while using a hand held mirror. She can independently open and close velcro to empty. Reviewed pouching routines, dietary precautions, importance of avoiding dehydration, and ordering supplies. 3 sets of each pouching supply left at the bedside, along with educational materials.  Use supplies: barrier ring, [place] # [phone], wafer Kellie Simmering # [phone], pouch Lawson # [phone] Enrolled patient in [place] program: Yes, during previous admission.  Husband states he has the box at home.  Discussed the need to call the phone number after discharge to order supplies.  Julien Girt MSN, RN, [place], [place], [employer]

## 2021-08-30 NOTE — TOC CM/SW Note (Signed)
Patient is not able to walk the distance required to go the bathroom, or he/she is unable to safely negotiate stairs required to access the bathroom.  A 3in1 BSC will alleviate this problem  

## 2021-08-30 NOTE — Progress Notes (Signed)
Physical Therapy Treatment Patient Details Name: Brianna Briggs MRN: [id number] DOB: [birth year] Today's Date: 08/30/2021   History of Present Illness 71 y/o F admitted 08/20/21 presenting with c/c of intermittent abdominal pain with associated N/V & generalized weakness. Pt also found to be hypotensive. Pt was admitted to the ICU for tx of sepsis 2/2 suspected intraabdominal source & possible aspiration PNA. Pt is s/p CT guided aspiration of abdominal abscess on 08/25/21. PMH: chronic respiratory failure with hypoxia, covid 19 infection, MDD, HLD, tobacco abuse, COPD, R breast CA s/p mastectomy, CAD, PAD, solitary kidney, recurrent diverticulitis with perforation & abscess s/p partial colectomy with anastomosis & loop ileostomy creation 07/31/21    PT Comments    Pt was initially hesitant with getting up with PT but ultimately did well and was able to do a prolonged bout of ambulation (~250 ft) with walker as well as negotiate up/down steps.  She was able to move with relative safety and confidence.  Her HR did get to nearly 150 bpm with the effort, O2 stays ~90% on room air.    Recommendations for follow up therapy are one component of a multi-disciplinary discharge planning process, led by the attending physician.  Recommendations may be updated based on patient status, additional functional criteria and insurance authorization.  Follow Up Recommendations  Home health PT Can patient physically be transported by private vehicle: Yes   Assistance Recommended at Discharge Intermittent Supervision/Assistance  Patient can return home with the following A little help with bathing/dressing/bathroom;A little help with walking and/or transfers;Assistance with cooking/housework;Assist for transportation;Help with stairs or ramp for entrance   Equipment Recommendations  Rolling walker (2 wheels);BSC/3in1    Recommendations for Other Services       Precautions / Restrictions  Precautions Precautions: Fall Precaution Comments: new R side colostomy bag Restrictions Weight Bearing Restrictions: No     Mobility  Bed Mobility Overal bed mobility: Modified Independent Bed Mobility: Supine to Sit Rolling: Supervision   Supine to sit: Supervision Sit to supine: Supervision        Transfers Overall transfer level: Modified independent Equipment used: Rolling walker (2 wheels) Transfers: Sit to/from Stand Sit to Stand: Supervision                Ambulation/Gait Ambulation/Gait assistance: Supervision Gait Distance (Feet): 250 Feet Assistive device: Rolling walker (2 wheels)         General Gait Details: Pt with slow but steady gait using walker, she does not typically need an AD but felt better and more stable with it today.  Pt on room air t/o the session with sats remaining in the 88-92% range, HR did start ~100 bpm at rest and increased to ~150 with prolonged ambulation   Stairs Stairs: Yes Stairs assistance: Min assist Stair Management: No rails Number of Stairs: 1 General stair comments: Pt was able to confidently negotiate up/down 1 step (x2) w/o issue   Wheelchair Mobility    Modified Rankin (Stroke Patients Only)       Balance Overall balance assessment: Needs assistance Sitting-balance support: No upper extremity supported Sitting balance-Leahy Scale: Good       Standing balance-Leahy Scale: Good Standing balance comment: able to take a few steps w/o UEs, but generally using UEs on AD or surface to stabilize                            Cognition Arousal/Alertness: Awake/alert Behavior During Therapy: [facility] for tasks  assessed/performed Overall Cognitive Status: Within Functional Limits for tasks assessed                                          Exercises      General Comments General comments (skin integrity, edema, etc.): Pt on RA at start of session SPO2 >/= 88% t/o session. Improves  with cues for PLB. HR remains 110's-120's with functional activity.      Pertinent Vitals/Pain Pain Assessment Faces Pain Scale: Hurts a little bit Pain Location: ostomy site    Home Living                          Prior Function            PT Goals (current goals can now be found in the care plan section) Progress towards PT goals: Progressing toward goals    Frequency    Min 2X/week      PT Plan Current plan remains appropriate    Co-evaluation              AM-PAC PT "6 Clicks" Mobility   Outcome Measure  Help needed turning from your back to your side while in a flat bed without using bedrails?: None Help needed moving from lying on your back to sitting on the side of a flat bed without using bedrails?: None Help needed moving to and from a bed to a chair (including a wheelchair)?: None Help needed standing up from a chair using your arms (e.g., wheelchair or bedside chair)?: A Little Help needed to walk in hospital room?: A Little Help needed climbing 3-5 steps with a railing? : A Little 6 Click Score: 21    End of Session Equipment Utilized During Treatment: Gait belt Activity Tolerance: Patient tolerated treatment well;Treatment limited secondary to medical complications (Comment) Patient left: in chair;with chair alarm set;with call bell/phone within reach Nurse Communication: Mobility status PT Visit Diagnosis: Unsteadiness on feet (R26.81);Muscle weakness (generalized) (M62.81);Difficulty in walking, not elsewhere classified (R26.2) Pain - Right/Left: Right Pain - part of body:  (abdomen)     Time: 4580-9983 PT Time Calculation (min) (ACUTE ONLY): 27 min  Charges:  $Gait Training: 8-22 mins $Therapeutic Exercise: 8-22 mins                     Kreg Shropshire, DPT 08/30/2021, 3:35 PM

## 2021-08-31 DIAGNOSIS — R6520 Severe sepsis without septic shock: Secondary | ICD-10-CM | POA: Diagnosis not present

## 2021-08-31 DIAGNOSIS — A419 Sepsis, unspecified organism: Secondary | ICD-10-CM | POA: Diagnosis not present

## 2021-08-31 DIAGNOSIS — E872 Acidosis, unspecified: Secondary | ICD-10-CM | POA: Diagnosis not present

## 2021-08-31 LAB — BASIC METABOLIC PANEL
Anion gap: 5 (ref 5–15)
BUN: 13 mg/dL (ref 8–23)
CO2: 32 mmol/L (ref 22–32)
Calcium: 8.4 mg/dL — ABNORMAL LOW (ref 8.9–10.3)
Chloride: 103 mmol/L (ref 98–111)
Creatinine, Ser: 0.67 mg/dL (ref 0.44–1.00)
GFR, Estimated: >60 mL/min (ref >60)
Glucose, Bld: 84 mg/dL (ref 70–99)
Potassium: 3.6 mmol/L (ref 3.5–5.1)
Sodium: 140 mmol/L (ref 135–145)

## 2021-08-31 LAB — CBC
HCT: 30 % — ABNORMAL LOW (ref 36.0–46.0)
Hemoglobin: 9.6 g/dL — ABNORMAL LOW (ref 12.0–15.0)
MCH: 29 pg (ref 26.0–34.0)
MCHC: 32 g/dL (ref 30.0–36.0)
MCV: 90.6 fL (ref 80.0–100.0)
Platelets: 162 10*3/uL (ref 150–400)
RBC: 3.31 MIL/uL — ABNORMAL LOW (ref 3.87–5.11)
RDW: 18.5 % — ABNORMAL HIGH (ref 11.5–15.5)
WBC: 7 10*3/uL (ref 4.0–10.5)
nRBC: 0 % (ref 0.0–0.2)

## 2021-08-31 LAB — MAGNESIUM: Magnesium: 1.9 mg/dL (ref 1.7–2.4)

## 2021-08-31 MED ORDER — IPRATROPIUM-ALBUTEROL 0.5-2.5 (3) MG/3ML IN SOLN
3.0000 mL | Freq: Two times a day (BID) | RESPIRATORY_TRACT | Status: DC
  Administered 2021-08-31 – 2021-09-01 (×2): 3 mL via RESPIRATORY_TRACT
  Filled 2021-08-31 (×2): qty 3

## 2021-08-31 NOTE — Hospital Course (Addendum)
Taken from prior notes.  71 y.o female with significant PMH of Chronic respiratory failure with hypoxia, COVID-19 virus infection, MDD (major depressive disorder), Hyperlipidemia, mixed, Tobacco abuse, Chronic obstructive pulmonary disease (COPD) ([place]), Right Breast, Cancer s/p mastectomy, CAD, PAD, Solitary Kidney, recurrent Diverticulitis w/perforation and abscess s/p partial colectomy with anastomosis and loop ileostomy creation 07/31/21 who presented to the ED with chief complaints of intermittent abdominal pain associated with n/v and generalized weakness.   Patient was recently admitted to the hospital from 7/12-8/1 with epigastric pain. Her prior CT abd/pelvis showed persistent abscess attributed to acute sigmoid diverticulitis. She had initially been treated with percutaneous drainage and IV abx but failed and ultimately underwent sigmoid resection with anastomosis and loop ileostomy creation on 7/16. She was also found to have COVID infection and was treated  with remdesevir and steroids. She was discharged to SNF for short term rehab. Since discharge patient report decreased po intake, intermittent abdominal pain, chills, nausea and vomiting. Staff at the SNF noticed that her bp was very low so sent her to the ED for further evaluation.   ED Course: In the emergency department, the temperature was 37.1C, the heart rate136  beats/minute, the blood 83/60 pressure  mm Hg, the respiratory rate 26  breaths/minute, and the oxygen saturation 95% on 2L   As per Dr. Genia Harold: 8/6: Admitted to ICU w/sepsis due to suspected intraabdominal source and possible aspiration pneumonia 8/7 on pressors, being weaned off slowly 8/8 on pressors, s/p fluid drainage 8/10 REMAINS ON PRESSORS 8/11 weaned off vasopressors overnight   As per Dr. Jimmye Norman 8/12-8/15/23: Pt completed the 7 day course of IV zosyn for diverticular abscess s/p drainage. PT/OT initially evaluated pt and recommended SNF. On re-evaluation, PT/OT  recs home health. [place] orders placed   8/16: Patient had 1 episode of fever yesterday around 7 PM when temperature went up to 101.  Remained afebrile since this morning.  No leukocytosis or any other lab abnormality.  No pain or concern of infection.  Colostomy bag with watery stool which is normal since she had this colostomy. Husband was very concerned about that fever and would like to observe another night before going home. He was also requesting a shower before discharge-order was placed.  We will just monitor without any new medication at this time.  8/17: Patient remained stable and no more episode of fever. Surgery again evaluated her and now just recommending outpatient follow-up and colostomy care. Home health services were arranged. Patient is being discharged on midodrine and Florinef and her primary care provider should be able to monitor and wean her off as appropriate.  Patient will continue on current management and need to have a close follow-up with her surgeon and primary care provider.

## 2021-08-31 NOTE — Assessment & Plan Note (Signed)
S/p drainage.  Completed a course of antibiotics. Hx of partial colectomy w/ anastomosis & loop ileostomy creation on 07/31/21 for diverticulitis w/ perforation & abscess.  -Continue with colostomy care

## 2021-08-31 NOTE — Assessment & Plan Note (Signed)
Estimated body mass index is 19.46 kg/m as calculated from the following:   Height as of this encounter: '5\' 2"'$  (1.575 m).   Weight as of this encounter: 48.3 kg.   -Dietitian is on board

## 2021-08-31 NOTE — Assessment & Plan Note (Signed)
Patient met the criteria for severe sepsis and septic shock per prior notes. Septic shock and sepsis resolved. Likely secondary to diverticular abscesses s/p drainage.  She completed the course of Zosyn. Developed 1 episode of fever yesterday with no worsening of leukocytosis or any other sign of infection. -Continue to monitor for 1 day

## 2021-08-31 NOTE — Assessment & Plan Note (Signed)
Resolved. -Continue to monitor and replete as needed 

## 2021-08-31 NOTE — Assessment & Plan Note (Signed)
Patient currently on baseline oxygen requirement and no acute concern. -Continue bronchodilators

## 2021-08-31 NOTE — Progress Notes (Signed)
Progress Note   Patient: Brianna Briggs FIE:[id number] DOB: 05/17/1950 DOA: 08/20/2021     10 DOS: the patient was seen and examined on 08/31/2021   Brief hospital course: Taken from prior notes.  71 y.o female with significant PMH of Chronic respiratory failure with hypoxia, COVID-19 virus infection, MDD (major depressive disorder), Hyperlipidemia, mixed, Tobacco abuse, Chronic obstructive pulmonary disease (COPD) ([place]), Right Breast, Cancer s/p mastectomy, CAD, PAD, Solitary Kidney, recurrent Diverticulitis w/perforation and abscess s/p partial colectomy with anastomosis and loop ileostomy creation 07/31/21 who presented to the ED with chief complaints of intermittent abdominal pain associated with n/v and generalized weakness.   Patient was recently admitted to the hospital from 7/12-8/1 with epigastric pain. Her prior CT abd/pelvis showed persistent abscess attributed to acute sigmoid diverticulitis. She had initially been treated with percutaneous drainage and IV abx but failed and ultimately underwent sigmoid resection with anastomosis and loop ileostomy creation on 7/16. She was also found to have COVID infection and was treated  with remdesevir and steroids. She was discharged to SNF for short term rehab. Since discharge patient report decreased po intake, intermittent abdominal pain, chills, nausea and vomiting. Staff at the SNF noticed that her bp was very low so sent her to the ED for further evaluation.   ED Course: In the emergency department, the temperature was 37.1C, the heart rate136  beats/minute, the blood 83/60 pressure  mm Hg, the respiratory rate 26  breaths/minute, and the oxygen saturation 95% on 2L   As per Dr. Genia Harold: . 8/6: Admitted to ICU w/sepsis due to suspected intraabdominal source and possible aspiration pneumonia . 8/7 on pressors, being weaned off slowly . 8/8 on pressors, s/p fluid drainage . 8/10 REMAINS ON PRESSORS . 8/11 weaned off vasopressors overnight    As per Dr. Jimmye Norman 8/12-8/15/23: Pt completed the 7 day course of IV zosyn for diverticular abscess s/p drainage. PT/OT initially evaluated pt and recommended SNF. On re-evaluation, PT/OT recs home health. [place] orders placed   8/16: Patient had 1 episode of fever yesterday around 7 PM when temperature went up to 101.  Remained afebrile since this morning.  No leukocytosis or any other lab abnormality.  No pain or concern of infection.  Colostomy bag with watery stool which is normal since she had this colostomy. Husband was very concerned about that fever and would like to observe another night before going home. He was also requesting a shower before discharge-order was placed.  We will just monitor without any new medication at this time.    Assessment and Plan: * Severe sepsis with lactic acidosis ([place]) Patient met the criteria for severe sepsis and septic shock per prior notes. Septic shock and sepsis resolved. Likely secondary to diverticular abscesses s/p drainage.  She completed the course of Zosyn. Developed 1 episode of fever yesterday with no worsening of leukocytosis or any other sign of infection. -Continue to monitor for 1 day  Intra-abdominal abscess ([facility]) S/p drainage.  Completed a course of antibiotics. Hx of partial colectomy w/ anastomosis & loop ileostomy creation on 07/31/21 for diverticulitis w/ perforation & abscess.  -Continue with colostomy care    Chronic obstructive pulmonary disease (COPD) ([place]) Patient currently on baseline oxygen requirement and no acute concern. -Continue bronchodilators  Hypokalemia Resolved. -Continue to monitor and replete as needed  Malnutrition of moderate degree Estimated body mass index is 19.46 kg/m as calculated from the following:   Height as of this encounter: _0  (1.575 m).   Weight as  of this encounter: 48.3 kg.   -Dietitian is on board     Subjective: Patient was seen and examined today.  No new complaint.   Patient developed fever yesterday evening and husband was very concerned and would like to be monitored little more.  Asking for shower.  Physical Exam: Vitals:   08/31/21 0500 08/31/21 0744 08/31/21 0800 08/31/21 1126  BP:  (!) 80/56 (!) 92/59 (!) 83/56  Pulse:  96 97 94  Resp:  18  16  Temp:  97.8 F (36.6 C)  98.5 F (36.9 C)  TempSrc:  Oral  Oral  SpO2:  98% 96% 97%  Weight: 48.3 kg     Height:       General.  Malnourished elderly lady, in no acute distress. Pulmonary.  Lungs clear bilaterally, normal respiratory effort. CV.  Regular rate and rhythm, no JVD, rub or murmur. Abdomen.  Soft, nontender, nondistended, BS positive.  Colostomy bag with watery stool. CNS.  Alert and oriented .  No focal neurologic deficit. Extremities.  No edema, no cyanosis, pulses intact and symmetrical. Psychiatry.  Judgment and insight appears normal.  Data Reviewed: Prior data reviewed  Family Communication: Discussed with husband at bedside  Disposition: Status is: Inpatient Remains inpatient appropriate because: Severity of illness   Planned Discharge Destination: Home with Home Health  DVT prophylaxis.  Lovenox Time spent: 45 minutes  This record has been created using Systems analyst. Errors have been sought and corrected,but may not always be located. Such creation errors do not reflect on the standard of care.  Author: Lorella Nimrod, MD 08/31/2021 3:50 PM  For on call review [email or web address].

## 2021-08-31 NOTE — Progress Notes (Signed)
   08/31/21 1600  Clinical Encounter Type  Visited With Patient and family together  Visit Type Follow-up  Spiritual Encounters  Spiritual Needs Emotional   Chaplain followed up on previous visit to provide support and engage in meaningful life questions.

## 2021-09-01 DIAGNOSIS — E872 Acidosis, unspecified: Secondary | ICD-10-CM | POA: Diagnosis not present

## 2021-09-01 DIAGNOSIS — R6520 Severe sepsis without septic shock: Secondary | ICD-10-CM | POA: Diagnosis not present

## 2021-09-01 DIAGNOSIS — A419 Sepsis, unspecified organism: Secondary | ICD-10-CM | POA: Diagnosis not present

## 2021-09-01 MED ORDER — MIDODRINE HCL 10 MG PO TABS: 10.0000 mg | ORAL_TABLET | Freq: Three times a day (TID) | ORAL | 0 refills | Status: DC

## 2021-09-01 MED ORDER — FLUDROCORTISONE ACETATE 0.1 MG PO TABS: 0.1000 mg | ORAL_TABLET | Freq: Every day | ORAL | 0 refills | Status: DC

## 2021-09-01 NOTE — Discharge Summary (Signed)
Physician Discharge Summary   Patient: Brianna Briggs MRN: [id number] DOB: Dec 04, 1950  Admit date:     08/20/2021  Discharge date: 09/01/21  Discharge Physician: Lorella Nimrod   PCP: Kirk Ruths, MD   Recommendations at discharge:  Patient is being discharged on midodrine and Florinef for softer blood pressure, please reevaluate on each visit and wean as appropriate. Follow-up with general surgery in 1 to 2 weeks.  Patient is being discharged with colostomy.  Discharge Diagnoses: Principal Problem:   Severe sepsis with lactic acidosis (HCC) Active Problems:   Intra-abdominal abscess (HCC)   Chronic obstructive pulmonary disease (COPD) (HCC)   Hypokalemia   Malnutrition of moderate degree   Hospital Course: Taken from prior notes.  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   Patient was recently admitted to the hospital from 7/12-8/1 with epigastric pain. Her prior CT abd/pelvis showed persistent abscess attributed to acute sigmoid diverticulitis. She had initially been treated with percutaneous drainage and IV abx but failed and ultimately underwent sigmoid resection with anastomosis and loop ileostomy creation on 7/16. She was also found to have COVID infection and was treated  with remdesevir and steroids. She was discharged to SNF for short term rehab. Since discharge patient report decreased po intake, intermittent abdominal pain, chills, nausea and vomiting. Staff at the SNF noticed  that her bp was very low so sent her to the ED for further evaluation.   ED Course: In the emergency department, the temperature was 37.1C, the heart rate136  beats/minute, the blood 83/60 pressure  mm Hg, the respiratory rate 26  breaths/minute, and the oxygen saturation 95% on 2L   As per Dr. Genia Harold: 8/6: Admitted to ICU w/sepsis due to suspected intraabdominal source and possible aspiration pneumonia 8/7 on pressors, being weaned off slowly 8/8 on pressors, s/p fluid drainage 8/10 REMAINS ON PRESSORS 8/11 weaned off vasopressors overnight   As per Dr. Jimmye Norman 8/12-8/15/23: Pt completed the 7 day course of IV zosyn for diverticular abscess s/p drainage. PT/OT initially evaluated pt and recommended SNF. On re-evaluation, PT/OT recs home health. [place] orders placed   8/16: Patient had 1 episode of fever yesterday around 7 PM when temperature went up to 101.  Remained afebrile since this morning.  No leukocytosis or any other lab abnormality.  No pain or concern of infection.  Colostomy bag with watery stool which is normal since she had this colostomy. Husband was very concerned about that fever and would like to observe another night before going home. He was also requesting a shower before discharge-order was placed.  We will just monitor without any new medication at this time.  8/17: Patient remained stable and no more episode of fever. Surgery again evaluated her and now just recommending outpatient follow-up and colostomy care. Home health services were arranged. Patient is being discharged on midodrine and Florinef and her primary care provider should be able to monitor and wean her off as appropriate.  Patient will continue on current management and need to have a close follow-up with her surgeon and primary care provider.  Assessment and Plan: * Severe sepsis with  lactic acidosis (HCC) Patient met the criteria for severe sepsis and septic shock per prior notes. Septic shock and  sepsis resolved. Likely secondary to diverticular abscesses s/p drainage.  She completed the course of Zosyn. Developed 1 episode of fever yesterday with no worsening of leukocytosis or any other sign of infection. -Continue to monitor for 1 day  Intra-abdominal abscess (HCC) S/p drainage.  Completed a course of antibiotics. Hx of partial colectomy w/ anastomosis & loop ileostomy creation on 07/31/21 for diverticulitis w/ perforation & abscess.  -Continue with colostomy care    Chronic obstructive pulmonary disease (COPD) ([place]) Patient currently on baseline oxygen requirement and no acute concern. -Continue bronchodilators  Hypokalemia Resolved. -Continue to monitor and replete as needed  Malnutrition of moderate degree Estimated body mass index is 19.46 kg/m as calculated from the following:   Height as of this encounter: '5\' 2"'  (1.575 m).   Weight as of this encounter: 48.3 kg.   -Dietitian is on board         Consultants: General surgery, PCCM Procedures performed: Intra-abdominal abscess drainage, Disposition: Home health Diet recommendation:  Discharge Diet Orders (From admission, onward)     Start     Ordered   09/01/21 0000  Diet - low sodium heart healthy        09/01/21 1136           Cardiac diet DISCHARGE MEDICATION: Allergies as of 09/01/2021       Reactions   Amoxicillin-pot Clavulanate Diarrhea, Rash   Other reaction(s): Unknown   Biaxin [clarithromycin] Diarrhea   Other reaction(s): Unknown   Oxycodone Itching   Codeine Rash   Nitrofurantoin Rash        Medication List     STOP taking these medications    acidophilus Caps capsule   guaiFENesin 600 MG 12 hr tablet Commonly known as: MUCINEX   HYDROcodone-acetaminophen 7.5-325 MG tablet Commonly known as: [place] these medications    alum & mag hydroxide-simeth 200-200-20 MG/5ML suspension Commonly known as: MAALOX/MYLANTA Take 15 mLs by mouth every 4 (four) hours  as needed for indigestion or heartburn.   Cholecalciferol 25 MCG (1000 UT) capsule Take 1,000 Units by mouth daily.   fludrocortisone 0.1 MG tablet Commonly known as: FLORINEF Take 1 tablet (0.1 mg total) by mouth daily. Start taking on: September 02, 2021   guaiFENesin-dextromethorphan 100-10 MG/5ML syrup Commonly known as: ROBITUSSIN DM Take 10 mLs by mouth every 4 (four) hours as needed for cough.   methocarbamol 500 MG tablet Commonly known as: ROBAXIN Take 1 tablet (500 mg total) by mouth every 8 (eight) hours as needed for muscle spasms.   midodrine 10 MG tablet Commonly known as: PROAMATINE Take 1 tablet (10 mg total) by mouth 3 (three) times daily with meals.   nicotine 21 mg/24hr patch Commonly known as: NICODERM CQ - dosed in mg/24 hours Place 1 patch (21 mg total) onto the skin daily as needed (nicotine craving).   nystatin powder Commonly known as: MYCOSTATIN/NYSTOP Apply 1 Application topically 2 (two) times daily as needed.   ondansetron 4 MG tablet Commonly known as: ZOFRAN Take 1 tablet (4 mg total) by mouth every 6 (six) hours as needed for nausea.   traMADol 50 MG tablet Commonly known as: ULTRAM Take 1 tablet (50 mg total) by mouth every 6 (six) hours as needed for moderate pain.   Ventolin HFA 108 (90 Base) MCG/ACT inhaler Generic drug: albuterol Inhale 2 puffs into  the lungs 2 (two) times daily.               Durable Medical Equipment  (From admission, onward)           Start     Ordered   08/30/21 1414  For home use only DME 3 n 1  Once        08/30/21 1413              Discharge Care Instructions  (From admission, onward)           Start     Ordered   09/01/21 0000  No dressing needed        09/01/21 1136            Follow-up Information     Kirk Ruths, MD. Schedule an appointment as soon as possible for a visit in 1 week(s).   Specialty: Internal Medicine Contact information: [place] [address] [phone]         Herbert Pun, MD. Schedule an appointment as soon as possible for a visit in 1 week(s).   Specialty: General Surgery Contact information: [place]  [address] [phone]                Discharge Exam: Filed Weights   08/29/21 0500 08/31/21 0500 09/01/21 0453  Weight: 51.5 kg 48.3 kg 45 kg   General.  Malnourished elderly lady, in no acute distress. Pulmonary.  Lungs clear bilaterally, normal respiratory effort. CV.  Regular rate and rhythm, no JVD, rub or murmur. Abdomen.  Soft, nontender, nondistended, BS positive.  Right-sided colostomy bag in place. CNS.  Alert and oriented .  No focal neurologic deficit. Extremities.  No edema, no cyanosis, pulses intact and symmetrical. Psychiatry.  Judgment and insight appears normal.   Condition at discharge: stable  The results of significant diagnostics from this hospitalization (including imaging, microbiology, ancillary and laboratory) are listed below for reference.   Imaging Studies: CT ASPIRATION N/S  Result Date: 08/24/2021 INDICATION: 71 year old woman with pelvic abscess presents to IR for CT-guided aspiration. EXAM: CT-guided aspiration of pelvic abscess MEDICATIONS: The patient is currently admitted to the hospital and receiving intravenous antibiotics. The antibiotics were administered within an appropriate time frame prior to the initiation of the procedure. ANESTHESIA/SEDATION: Moderate (conscious) sedation was employed during this procedure. A total of Versed 0.5 mg and Fentanyl 25 mcg was administered intravenously by the radiology nurse. Total intra-service moderate Sedation Time: 14 minutes. The patient's level of consciousness and vital signs were monitored continuously by radiology nursing throughout the procedure under my direct supervision. COMPLICATIONS: None immediate. PROCEDURE: Informed written consent was  obtained from the patient after a thorough discussion of the procedural risks, benefits and alternatives. All questions were addressed. Maximal Sterile Barrier Technique was utilized including caps, mask, sterile gowns, sterile gloves, sterile drape, hand hygiene and skin antiseptic. A timeout was performed prior to the initiation of the procedure. Patient position prone on the procedure table. The right posterior pelvic skin prepped and draped usual fashion. Following local administration, 17 gauge introducer needle was advanced into the right pelvic collection utilizing CT guidance. 5 mL of purulent material was aspirated and sent for Gram stain and culture. No significant hemorrhage was identified on post aspiration CT. The collection is too small for drain placement. IMPRESSION: CT-guided aspiration of right pelvic abscess. Electronically Signed   By: Danie Binder.D.  On: 08/24/2021 15:48   ECHOCARDIOGRAM COMPLETE  Result Date: 08/22/2021    ECHOCARDIOGRAM REPORT   Patient Name:   MACHELE DEIHL Date of Exam: 08/22/2021 Medical Rec #:  [id number]          Height:       62.0 in Accession #:    [id number]         Weight:       112.4 lb Date of Birth:  [birth year]         BSA:          1.497 m Patient Age:    6 years           BP:           106/59 mmHg Patient Gender: F                  HR:           75 bpm. Exam Location:  [facility] Procedure: 2D Echo, Cardiac Doppler and Color Doppler Indications:     R94.31 Abnormal EKG  History:         Patient has prior history of Echocardiogram examinations, most                  recent 08/01/2021. Risk Factors:Dyslipidemia.  Sonographer:     Bernadene Person RDCS Referring Phys:  [id number] Teressa Lower Diagnosing Phys: Kathlyn Sacramento MD IMPRESSIONS  1. Left ventricular ejection fraction, by estimation, is 55 to 60%. The left ventricle has normal function. The left ventricle has no regional wall motion abnormalities. Left ventricular diastolic parameters are indeterminate.  2.  Right ventricular systolic function is normal. The right ventricular size is normal. Tricuspid regurgitation signal is inadequate for assessing PA pressure.  3. The mitral valve is normal in structure. Trivial mitral valve regurgitation. No evidence of mitral stenosis.  4. The aortic valve is normal in structure. Aortic valve regurgitation is not visualized. No aortic stenosis is present.  5. The inferior vena cava is dilated in size with <50% respiratory variability, suggesting right atrial pressure of 15 mmHg. FINDINGS  Left Ventricle: Left ventricular ejection fraction, by estimation, is 55 to 60%. The left ventricle has normal function. The left ventricle has no regional wall motion abnormalities. The left ventricular internal cavity size was normal in size. There is  no left ventricular hypertrophy. Left ventricular diastolic parameters are indeterminate. Right Ventricle: The right ventricular size is normal. No increase in right ventricular wall thickness. Right ventricular systolic function is normal. Tricuspid regurgitation signal is inadequate for assessing PA pressure. The tricuspid regurgitant velocity is 2.60 m/s, and with an assumed right atrial pressure of 15 mmHg, the estimated right ventricular systolic pressure is 10.0 mmHg. Left Atrium: Left atrial size was normal in size. Right Atrium: Right atrial size was normal in size. Pericardium: There is no evidence of pericardial effusion. Mitral Valve: The mitral valve is normal in structure. Trivial mitral valve regurgitation. No evidence of mitral valve stenosis. Tricuspid Valve: The tricuspid valve is normal in structure. Tricuspid valve regurgitation is trivial. No evidence of tricuspid stenosis. Aortic Valve: The aortic valve is normal in structure. Aortic valve regurgitation is not visualized. No aortic stenosis is present. Pulmonic Valve: The pulmonic valve was normal in structure. Pulmonic valve regurgitation is mild. No evidence of pulmonic  stenosis. Aorta: The aortic root is normal in size and structure. Venous: The inferior vena cava is dilated in size with less than 50% respiratory variability, suggesting right atrial  pressure of 15 mmHg. IAS/Shunts: No atrial level shunt detected by color flow Doppler.  LEFT VENTRICLE PLAX 2D LVIDd:         4.79 cm     Diastology LVIDs:         3.61 cm     LV e' medial:    7.22 cm/s LV PW:         0.86 cm     LV E/e' medial:  14.1 LV IVS:        0.66 cm     LV e' lateral:   6.73 cm/s LVOT diam:     2.10 cm     LV E/e' lateral: 15.2 LV SV:         70 LV SV Index:   47 LVOT Area:     3.46 cm  LV Volumes (MOD) LV vol d, MOD A2C: 90.1 ml LV vol d, MOD A4C: 75.8 ml LV vol s, MOD A2C: 39.2 ml LV vol s, MOD A4C: 32.2 ml LV SV MOD A2C:     50.9 ml LV SV MOD A4C:     75.8 ml LV SV MOD BP:      47.4 ml RIGHT VENTRICLE RV S prime:     11.80 cm/s TAPSE (M-mode): 2.4 cm LEFT ATRIUM             Index        RIGHT ATRIUM           Index LA diam:        4.00 cm 2.67 cm/m   RA Area:     14.50 cm LA Vol (A2C):   44.5 ml 29.73 ml/m  RA Volume:   39.00 ml  26.06 ml/m LA Vol (A4C):   37.2 ml 24.86 ml/m LA Biplane Vol: 42.7 ml 28.53 ml/m  AORTIC VALVE             PULMONIC VALVE LVOT Vmax:   103.00 cm/s PR End Diast Vel: 9.00 msec LVOT Vmean:  64.900 cm/s LVOT VTI:    0.202 m  AORTA Ao Root diam: 3.20 cm Ao Asc diam:  3.50 cm MITRAL VALVE                TRICUSPID VALVE MV Area (PHT): 3.27 cm     TR Peak grad:   27.0 mmHg MV Decel Time: 232 msec     TR Vmax:        260.00 cm/s MV E velocity: 102.00 cm/s MV A velocity: 97.40 cm/s   SHUNTS MV E/A ratio:  1.05         Systemic VTI:  0.20 m                             Systemic Diam: 2.10 cm Kathlyn Sacramento MD Electronically signed by Kathlyn Sacramento MD Signature Date/Time: 08/22/2021/1:10:04 PM    Final    CT ABDOMEN PELVIS W CONTRAST  Result Date: 08/21/2021 CLINICAL DATA:  Anemia, recent partial colectomy EXAM: CT ABDOMEN AND PELVIS WITH CONTRAST TECHNIQUE: Multidetector CT imaging  of the abdomen and pelvis was performed using the standard protocol following bolus administration of intravenous contrast. RADIATION DOSE REDUCTION: This exam was performed according to the departmental dose-optimization program which includes automated exposure control, adjustment of the mA and/or kV according to patient size and/or use of iterative reconstruction technique. CONTRAST:  72m OMNIPAQUE IOHEXOL 350 MG/ML SOLN COMPARISON:  07/29/2021, 08/20/2021 FINDINGS: Lower chest: Stable  right middle lobe atelectasis and bronchiectasis. Progressive hypoventilatory changes at the left lung base. Hepatobiliary: Small gallstones are again identified, along with vicarious excretion of previously administered contrast. No evidence of acute cholecystitis. Liver is unremarkable. No biliary duct dilation. Pancreas: Unremarkable. No pancreatic ductal dilatation or surrounding inflammatory changes. Spleen: Normal in size without focal abnormality. Adrenals/Urinary Tract: Stable subcentimeter left adrenal myelolipoma. Right adrenal is unremarkable. Chronic right renal atrophy unchanged. Stable appearance of the left kidney, with cortical scarring and benign simple cysts. No urinary tract calculi or obstructive uropathy within the left kidney. The bladder is decompressed with a Foley catheter. Stomach/Bowel: Postsurgical changes are seen from prior sigmoid colon resection and reanastomosis. Stable inflammatory changes within the lower pelvic mesentery surrounding the anastomosis. Stable small fluid collections within the right hemipelvis, measuring 2.1 x 1.6 cm image 71/2 and 2.3 x 1.9 cm image 68/2, suspicious for abscesses. Loop ileostomy is seen within the right lower quadrant. Stable parastomal hernia. Stable small gas/fluid collection within the anterior abdominal wall adjacent to the diverting ileostomy, measuring 1.1 cm, which may be postsurgical. No bowel obstruction or ileus. Vascular/Lymphatic: Left femoral venous  catheter tip within the left common iliac vein. Stable atherosclerosis of the aorta and its branches. No pathologic adenopathy within the abdomen or pelvis. Reproductive: The uterus is surgically absent. Ovaries are not well visualized and may be surgically absent. Other: No free fluid or free intraperitoneal gas. Fat containing parastomal hernia. Musculoskeletal: No acute or destructive bony lesions. Reconstructed images demonstrate no additional findings. IMPRESSION: 1. Stable inflammatory changes within the lower pelvis adjacent to the site of prior sigmoid colon resection and reanastomosis. No evidence of anastomotic breakdown. 2. Stable small right hemipelvic fluid collections consistent with abscesses. 3. Right lower quadrant loop ileostomy, with inflammatory changes and small gas fluid level within the adjacent subcutaneous fat. This may reflect postsurgical change or small postsurgical abscess. 4. Fat containing parastomal hernia. 5. Cholelithiasis without cholecystitis. 6.  Aortic Atherosclerosis (ICD10-I70.0). Electronically Signed   By: Randa Ngo M.D.   On: 08/21/2021 23:07   CT HEAD WO CONTRAST (5MM)  Result Date: 08/21/2021 CLINICAL DATA:  Headache, sudden, severe Headache, new or worsening (Age >= 50y) EXAM: CT HEAD WITHOUT CONTRAST TECHNIQUE: Contiguous axial images were obtained from the base of the skull through the vertex without intravenous contrast. RADIATION DOSE REDUCTION: This exam was performed according to the departmental dose-optimization program which includes automated exposure control, adjustment of the mA and/or kV according to patient size and/or use of iterative reconstruction technique. COMPARISON:  None Available. FINDINGS: Brain: No intracranial hemorrhage, mass effect, or midline shift. No hydrocephalus. The basilar cisterns are patent. No evidence of territorial infarct or acute ischemia. No extra-axial or intracranial fluid collection. Vascular: Atherosclerosis of  skullbase vasculature without hyperdense vessel or abnormal calcification. Skull: No fracture or focal lesion. Sinuses/Orbits: Mucosal thickening of the left maxillary sinus with small fluid level. Scattered mucosal thickening throughout the ethmoid air cells. There is no mastoid effusion. Other: None. IMPRESSION: 1. No acute intracranial abnormality. 2. Paranasal sinus mucosal thickening with small fluid level in the left maxillary sinus, can be seen with acute sinusitis in the appropriate clinical setting. Electronically Signed   By: Keith Rake M.D.   On: 08/21/2021 22:57   CT ABDOMEN PELVIS W CONTRAST  Result Date: 08/20/2021 CLINICAL DATA:  Abdominal pain, acute, nonlocalized Recent partial colectomy. EXAM: CT ABDOMEN AND PELVIS WITH CONTRAST TECHNIQUE: Multidetector CT imaging of the abdomen and pelvis was performed using the standard  protocol following bolus administration of intravenous contrast. RADIATION DOSE REDUCTION: This exam was performed according to the departmental dose-optimization program which includes automated exposure control, adjustment of the mA and/or kV according to patient size and/or use of iterative reconstruction technique. CONTRAST:  69m OMNIPAQUE IOHEXOL 350 MG/ML SOLN COMPARISON:  Most recent CT 07/29/2021 FINDINGS: Lower chest: Assessed on concurrent chest CT, reported separately. Hepatobiliary: Focal fatty infiltration adjacent to the falciform ligament, unchanged there is a tiny subcapsular cyst in the inferior right hepatic lobe, stable. Layering stones or sludge in the gallbladder without abnormal gallbladder distention or pericholecystic inflammation. Pancreas: No ductal dilatation or inflammation. Spleen: Normal in size without focal abnormality. 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 Normal right adrenal gland. Chronic right renal atrophy with no visible renal parenchyma. Chronic right renal  cysts versus hydronephrosis as well as hydroureter, transition to decompressed ureter in the midportion, no stone or cause for obstruction. Homogeneous enhancement of the left kidney. Areas of cortical scarring in the left kidney. Simple left renal cysts which need no dedicated follow-up. Partially distended urinary bladder, no wall thickening. Stomach/Bowel: Interval loop ileostomy in the right abdomen. Small peristomal hernia. Enteric sutures in the colon. There is fluid within the insitu colon. There is no small bowel obstruction or inflammatory change. Vascular/Lymphatic: Moderate aortic atherosclerosis. No aortic aneurysm. Patent portal and splenic veins. No mesenteric vein thrombosis. No bulky abdominopelvic adenopathy. Reproductive: Hysterectomy.  No adnexal mass. Other: There are small pelvic fluid collections. Right pelvic collection measures 2.3 x 1.9 x 2.3 cm, series 2, image 70. A more posterior right pelvic fluid collection measures 2.6 x 2 x 2.2 cm, series 2, image 68. There is slight peripheral enhancement of both of these collections. Additional ill-defined areas of fluid in the pelvis. No free air. Laparotomy changes. No subcutaneous collection. Musculoskeletal: Lower lumbar facet hypertrophy. Prominent Schmorl's node within the superior endplate of L2. No acute osseous findings. No intramuscular collection. IMPRESSION: 1. Interval loop ileostomy in the right abdomen with small peristomal hernia. No bowel obstruction. 2. Two small right pelvic fluid collections, 2.6 and 2.3 cm largest dimension respectively. Sterility is indeterminate, although the presence of peripheral enhancement suggests abscess/infection. 3. Layering stones or sludge in the gallbladder without CT findings of acute cholecystitis. 4. Additional stable chronic and incidental findings as described. Aortic Atherosclerosis (ICD10-I70.0). Electronically Signed   By: MKeith RakeM.D.   On: 08/20/2021 20:30   CT Angio Chest PE W  and/or Wo Contrast  Result Date: 08/20/2021 CLINICAL DATA:  Pulmonary embolism (PE) suspected, high prob Recent partial colectomy. EXAM: CT ANGIOGRAPHY CHEST WITH CONTRAST TECHNIQUE: Multidetector CT imaging of the chest was performed using the standard protocol during bolus administration of intravenous contrast. Multiplanar CT image reconstructions and MIPs were obtained to evaluate the vascular anatomy. RADIATION DOSE REDUCTION: This exam was performed according to the departmental dose-optimization program which includes automated exposure control, adjustment of the mA and/or kV according to patient size and/or use of iterative reconstruction technique. CONTRAST:  779mOMNIPAQUE IOHEXOL 350 MG/ML SOLN COMPARISON:  Chest CT 03/21/2021, radiograph earlier today FINDINGS: Cardiovascular: There are no filling defects within the pulmonary arteries to suggest pulmonary embolus. Moderate calcified noncalcified atheromatous plaque throughout the thoracic aorta. No dissection or acute aortic findings. Heart is normal in size. There is generalized lipomatous hypertrophy, including the intra-atrial septum. Small pericardial effusion anteriorly. Mediastinum/Nodes: No enlarged lymph nodes. The esophagus is decompressed. No thyroid nodule. Lungs/Pleura: Moderate to  advanced emphysema. There is right middle lobe collapse with frothy debris in the right middle lobe bronchus. Minimal retained mucus is also seen within the trachea. Peripheral atelectasis in the left lower lobe and lingula. No pleural effusion. Scattered pulmonary nodules are stable from prior exam, largest measuring 6 mm in the right upper lobe series 6, image 56. Upper Abdomen: Assessed on concurrent abdominal CT, reported separately. Musculoskeletal: There are no acute or suspicious osseous abnormalities. Right mastectomy with breast implant. Review of the MIP images confirms the above findings. IMPRESSION: 1. No pulmonary embolus. 2. Right middle lobe  collapse with frothy debris in the right middle lobe bronchus, may be mucous plugging or seen in the setting of aspiration. 3. Moderate to advanced emphysema. 4. Scattered pulmonary nodules are stable from prior exam, largest measuring 6 mm in the right upper lobe. Recommend continued annual lung cancer screening CT. 5. Lipomatous hypertrophy of the intra-atrial septum. Aortic Atherosclerosis (ICD10-I70.0) and Emphysema (ICD10-J43.9). Electronically Signed   By: Keith Rake M.D.   On: 08/20/2021 20:14   CT Cervical Spine Wo Contrast  Result Date: 08/20/2021 CLINICAL DATA:  Cervical compression fracture EXAM: CT CERVICAL SPINE WITHOUT CONTRAST TECHNIQUE: Multidetector CT imaging of the cervical spine was performed without intravenous contrast. Multiplanar CT image reconstructions were also generated. RADIATION DOSE REDUCTION: This exam was performed according to the departmental dose-optimization program which includes automated exposure control, adjustment of the mA and/or kV according to patient size and/or use of iterative reconstruction technique. COMPARISON:  None Available. FINDINGS: Alignment: Normal. Skull base and vertebrae: No acute fracture. 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 No primary bone lesion or focal pathologic process. Soft tissues and spinal canal: No prevertebral fluid or swelling. No visible canal hematoma. Disc levels: Mild disc space height loss and osteophytosis of the lower cervical levels. Upper chest: Severe emphysema in the included lung apices. Other: None. IMPRESSION: 1. No fracture or static subluxation of the cervical spine. 2. Mild multilevel cervical disc degenerative disease. 3. Severe emphysema in the included lung apices. Emphysema (ICD10-J43.9). Electronically Signed   By: Delanna Ahmadi M.D.   On: 08/20/2021 20:12   DG Chest Port 1 View  Result Date: 08/20/2021 CLINICAL DATA:  Dehydration loss of appetite.  Possible sepsis. EXAM: PORTABLE CHEST 1  VIEW COMPARISON:  08/07/2021 FINDINGS: 1722 hours. Markedly improved aeration in the right base. There is some trace atelectasis or linear scarring at the left base. The lungs are clear without focal pneumonia, edema, pneumothorax or pleural effusion. The cardiopericardial silhouette is within normal limits for size. The visualized bony structures of the thorax are unremarkable. Telemetry leads overlie the chest. IMPRESSION: No active disease. Electronically Signed   By: Misty Stanley M.D.   On: 08/20/2021 17:32   DG Chest Port 1 View  Result Date: 08/07/2021 CLINICAL DATA:  COVID pneumonia. EXAM: PORTABLE CHEST 1 VIEW COMPARISON:  08/03/2021 and 06/08/2021 FINDINGS: Patient is rotated to the right. Lungs are adequately inflated with slight worsening opacification over the right base with mild elevation of the right hemidiaphragm as findings may be due to atelectasis with small associated effusion, although infection is possible. Remainder of the right lung as well as the left lung is unremarkable. Cardiomediastinal silhouette and remainder of the exam is unchanged. IMPRESSION: Slight worsening right base opacification likely atelectasis with small associated effusion. Infection in the right base is possible. Electronically Signed   By: Marin Olp M.D.   On: 08/07/2021 08:28   DG Chest [facility] [address]  Result Date: 08/03/2021 CLINICAL DATA:  Sepsis. EXAM: PORTABLE CHEST 1 VIEW COMPARISON:  Jun 08, 2021. FINDINGS: The heart size and mediastinal contours are within normal limits. Bibasilar atelectasis or edema is noted with small bilateral pleural effusions. The visualized skeletal structures are unremarkable. IMPRESSION: Mild bibasilar atelectasis or edema is noted with small bilateral pleural effusions. Electronically Signed   By: Marijo Conception M.D.   On: 08/03/2021 08:55    Microbiology: Results for orders placed or performed during the hospital encounter of 08/20/21  Blood Culture (routine x 2)      Status: None   Collection Time: 08/20/21  5:11 PM   Specimen: BLOOD  Result Value Ref Range Status   Specimen Description BLOOD BLOOD LEFT HAND  Final   Special Requests   Final    BOTTLES DRAWN AEROBIC AND ANAEROBIC Blood Culture adequate volume   Culture   Final    NO GROWTH 5 DAYS Performed at [facility], [address], [place], [place] [address]    Report Status 08/25/2021 FINAL  Final  Blood Culture (routine x 2)     Status: None   Collection Time: 08/20/21  5:16 PM   Specimen: BLOOD  Result Value Ref Range Status   Specimen Description BLOOD LEFT ANTECUBITAL  Final   Special Requests   Final    BOTTLES DRAWN AEROBIC AND ANAEROBIC Blood Culture adequate volume   Culture   Final    NO GROWTH 5 DAYS Performed at [facility], [address] [place], [place] [address]    Report Status 08/25/2021 FINAL  Final  MRSA Next Gen by PCR, Nasal     Status: None   Collection Time: 08/21/21  1:22 AM   Specimen: Nasal Mucosa; Nasal Swab  Result Value Ref Range Status   MRSA by PCR Next Gen NOT DETECTED NOT DETECTED Final    Comment: (NOTE) The GeneXpert MRSA Assay (FDA approved for NASAL specimens only), is one component of a comprehensive MRSA colonization surveillance program. It is not intended to diagnose MRSA infection nor to guide or monitor treatment for MRSA infections. Test performance is not FDA approved in patients less than 37 years old. Performed at [facility], [address] [place], [place] [address]   Gastrointestinal Panel by PCR , Stool     Status: None   Collection Time: 08/21/21  2:50 AM   Specimen: Stool  Result Value Ref Range Status   Campylobacter species NOT DETECTED NOT DETECTED Final   Plesimonas shigelloides NOT DETECTED NOT DETECTED Final   Salmonella species NOT DETECTED NOT DETECTED Final   Yersinia enterocolitica NOT DETECTED NOT DETECTED Final   Vibrio species NOT DETECTED NOT DETECTED Final   Vibrio cholerae  NOT DETECTED NOT DETECTED Final   Enteroaggregative E coli (EAEC) NOT DETECTED NOT DETECTED Final   Enteropathogenic E coli (EPEC) NOT DETECTED NOT DETECTED Final   Enterotoxigenic E coli (ETEC) NOT DETECTED NOT DETECTED Final   Shiga like toxin producing E coli (STEC) NOT DETECTED NOT DETECTED Final   Shigella/Enteroinvasive E coli (EIEC) NOT DETECTED NOT DETECTED Final   Cryptosporidium NOT DETECTED NOT DETECTED Final   Cyclospora cayetanensis NOT DETECTED NOT DETECTED Final   Entamoeba histolytica NOT DETECTED NOT DETECTED Final   Giardia lamblia NOT DETECTED NOT DETECTED Final   Adenovirus F40/41 NOT DETECTED NOT DETECTED Final   Astrovirus NOT DETECTED NOT DETECTED Final   Norovirus GI/GII NOT DETECTED NOT DETECTED Final   Rotavirus A NOT DETECTED  NOT DETECTED Final   Sapovirus (I, II, IV, and V) NOT DETECTED NOT DETECTED Final    Comment: Performed at [facility], [address] [place], [place] [address]  Urine Culture     Status: None   Collection Time: 08/21/21  3:02 AM   Specimen: Urine, Catheterized  Result Value Ref Range Status   Specimen Description   Final    URINE, CATHETERIZED Performed at [facility], [address] [place], [place] [address]    Special Requests   Final    NONE Performed at [facility], [address] [place], [place] [address]    Culture   Final    NO GROWTH Performed at [facility], [place] [address] [place], [place] [address]    Report Status 08/22/2021 FINAL  Final  C Difficile Quick Screen w PCR reflex     Status: None   Collection Time: 08/21/21 10:13 AM   Specimen: STOOL  Result Value Ref Range Status   C Diff antigen NEGATIVE NEGATIVE Final   C Diff toxin NEGATIVE NEGATIVE Final   C Diff interpretation No C. difficile detected.  Final    Comment: Performed at [facility], [address] [place], [place] [address]  Aerobic/Anaerobic Culture w Gram Stain (surgical/deep wound)      Status: None   Collection Time: 08/24/21  2:23 PM   Specimen: Abscess  Result Value Ref Range Status   Specimen Description   Final    ABSCESS Performed at [facility], [address] [place], [place] [address]    Special Requests   Final    NONE Performed at [facility], [address] [place], [place] [address]    Gram Stain NO ORGANISMS SEEN  Final   Culture   Final    No growth aerobically or anaerobically. Performed at [facility], [place] [address] [place], [place] [address]    Report Status 08/29/2021 FINAL  Final    Labs: CBC: Recent Labs  Lab 08/27/21 0344 08/28/21 0510 08/29/21 0930 08/30/21 0458 08/31/21 0623  WBC 5.8 6.4 7.2 9.2 7.0  HGB 9.6* 9.5* 10.1* 9.6* 9.6*  HCT 30.5* 30.1* 32.1* 29.9* 30.0*  MCV 90.8 90.7 90.2 89.8 90.6  PLT 168 182 165 176 407   Basic Metabolic Panel: Recent Labs  Lab 08/27/21 0344 08/28/21 0510 08/29/21 0930 08/30/21 0458 08/31/21 0623  NA 140 141 140 138 140  K 3.3* 4.0 3.1* 4.2 3.6  CL 102 104 102 102 103  CO2 30 32 31 30 32  GLUCOSE 121* 115* 95 85 84  BUN '12 14 14 16 13  ' CREATININE 0.69 0.64 0.59 0.62 0.67  CALCIUM 8.5* 8.1* 8.6* 8.4* 8.4*  MG 1.5* 2.2  --  1.8 1.9  PHOS 2.6 3.0  --   --   --    Liver Function Tests: No results for input(s): "AST", "ALT", "ALKPHOS", "BILITOT", "PROT", "ALBUMIN" in the last 168 hours. CBG: No results for input(s): "GLUCAP" in the last 168 hours.  Discharge time spent: greater than 30 minutes.  This record has been created using Systems analyst. Errors have been sought and corrected,but may not always be located. Such creation errors do not reflect on the standard of care.   Signed: Lorella Nimrod, MD [facility] 09/01/2021

## 2021-09-01 NOTE — Progress Notes (Signed)
Patient ID: Brianna Briggs, female   DOB: May 17, 1950, 71 y.o.   MRN: [id number]     [place] [facility] Day(s): 11.   Interval History: Patient seen and examined, no acute events or new complaints overnight. Patient reports "feeling okay I guess".  Denies abdominal pain.  Endorses that she is eating better.  No issues with ileostomy.  Vital signs in last 24 hours: [min-max] current  Temp:  [97.8 F (36.6 C)-98.6 F (37 C)] 98.1 F (36.7 C) (08/17 0331) Pulse Rate:  [80-97] 89 (08/17 0415) Resp:  [15-18] 18 (08/17 0331) BP: (80-99)/(52-83) 83/59 (08/17 0415) SpO2:  [96 %-99 %] 99 % (08/17 0331) Weight:  [45 kg] 45 kg (08/17 0453)     Height: '5\' 2"'$  (157.5 cm) Weight: 45 kg BMI (Calculated): 18.14   Physical Exam:  Constitutional: alert, cooperative and no distress  Respiratory: breathing non-labored at rest  Cardiovascular: regular rate and sinus rhythm  Gastrointestinal: soft, non-tender, and non-distended.  Ileostomy is pink and patent.  Labs:     Latest Ref Rng & Units 08/31/2021    6:23 AM 08/30/2021    4:58 AM 08/29/2021    9:30 AM  CBC  WBC 4.0 - 10.5 K/uL 7.0  9.2  7.2   Hemoglobin 12.0 - 15.0 g/dL 9.6  9.6  10.1   Hematocrit 36.0 - 46.0 % 30.0  29.9  32.1   Platelets 150 - 400 K/uL 162  176  165       Latest Ref Rng & Units 08/31/2021    6:23 AM 08/30/2021    4:58 AM 08/29/2021    9:30 AM  CMP  Glucose 70 - 99 mg/dL 84  85  95   BUN 8 - 23 mg/dL '13  16  14   '$ Creatinine 0.44 - 1.00 mg/dL 0.67  0.62  0.59   Sodium 135 - 145 mmol/L 140  138  140   Potassium 3.5 - 5.1 mmol/L 3.6  4.2  3.1   Chloride 98 - 111 mmol/L 103  102  102   CO2 22 - 32 mmol/L 32  30  31   Calcium 8.9 - 10.3 mg/dL 8.4  8.4  8.6     Imaging studies: No new pertinent imaging studies   Assessment/Plan:  71 y.o. female with sepsis (resolved).   Possible etiologies are small intra-abdominal abscess versus pneumonia/COPD exacerbation  -She has recovered adequately. -She is  tolerating regular diet. -Ileostomy today with pasty stool with adequate output -No abdominal pain -No fever in last 24 hours -No contraindication to discharge from surgical standpoint.  Patient can be seen in my office as outpatient in 2 weeks.  Arnold Long, MD

## 2021-09-01 NOTE — Progress Notes (Signed)
Nutrition Follow-up  DOCUMENTATION CODES:   Non-severe (moderate) malnutrition in context of chronic illness  INTERVENTION:   -Continue regular diet -Continue MVI with minerals daily  NUTRITION DIAGNOSIS:   Moderate Malnutrition related to chronic illness (COPD) as evidenced by moderate fat depletion, moderate muscle depletion, severe muscle depletion.  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  Reviewed I/O's: -375 ml x 24 hours and +1.2 L since admission  Colostomy output: 375 ml x 24 hours   Pt with variable oral intake. Noted meal completions 25-75%. Pt does not care for hospital food, but often eats breakfast. She consumes outside food brought in by her husband. Pt refuses all offered supplements [employer], [employer], [employer], [employer], and Ensure), as she does not like the taste of any of them.   Per TOC notes, plan to d/c home with home health services.   Medications reviewed and include melatonin.   Labs reviewed: CBGS: 140 (inpatient orders for glycemic control are none).    Diet Order:   Diet Order             Diet regular Room service appropriate? Yes; Fluid consistency: Thin  Diet effective now                   EDUCATION NEEDS:   Education  needs have been addressed  Skin:  Skin Assessment: Reviewed RN Assessment  Last BM:  08/29/21 (250 output via colostomy)  Height:   Ht Readings from Last 1 Encounters:  08/20/21 '5\' 2"'$  (1.575 m)    Weight:   Wt Readings from Last 1 Encounters:  09/01/21 45 kg    Ideal Body Weight:  50 kg  BMI:  Body mass index is 18.15 kg/m.  Estimated Nutritional Needs:   Kcal:  1600-1800  Protein:  90-105 grams  Fluid:  > 1.6 L    Loistine Chance, RD, LDN, [place] Registered Dietitian II Certified Diabetes Care and Education Specialist Please refer to [facility] for RD and/or RD on-call/weekend/after hours pager

## 2021-09-01 NOTE — Care Management Important Message (Signed)
Important Message  Patient Details  Name: Brianna Briggs MRN: [id number] Date of Birth: 16-Oct-1950   Medicare Important Message Given:  Yes     [name] 09/01/2021, 11:37 AM

## 2021-09-01 NOTE — TOC Transition Note (Signed)
Transition of Care [facility]) - CM/SW Discharge Note   Patient Details  Name: Reginae Wolfrey MRN: [id number] Date of Birth: [birth year]  Transition of Care [facility]) CM/SW Contact:  Candie Chroman, LCSW Phone Number: 09/01/2021, 12:23 PM   Clinical Narrative: Patient has orders to discharge home today. [facility] representative is aware. No further concerns. CSW signing off.    Final next level of care: Home w Home Health Services Barriers to Discharge: Barriers Resolved   Patient Goals and CMS Choice   CMS Medicare.gov Compare Post Acute Care list provided to:: Patient Choice offered to / list presented to : Patient, Spouse  Discharge Placement                    Patient and family notified of of transfer: 09/01/21  Discharge Plan and Services   Discharge Planning Services: CM Consult Post Acute Care Choice: [place]          DME Arranged: 3-N-1 DME Agency: [employer] Date DME Agency Contacted: 08/30/21   Representative spoke with at DME Agency: Suanne Marker HH Arranged: RN, PT, OT, Nurse's Aide [place] Agency: [place] ([place]) Date HH Agency Contacted: 09/01/21   Representative spoke with at [place]: [place] (SDOH) Interventions     Readmission Risk Interventions    07/28/2021    4:14 PM  Readmission Risk Prevention Plan  Transportation Screening Complete  PCP or Specialist Appt within 5-7 Days Complete  Home Care Screening Complete  Medication Review (RN CM) Complete

## 2021-09-01 NOTE — Plan of Care (Signed)

## 2021-11-08 ENCOUNTER — Other Ambulatory Visit (HOSPITAL_COMMUNITY): Payer: Self-pay | Admitting: General Surgery

## 2021-11-08 ENCOUNTER — Other Ambulatory Visit: Payer: Self-pay | Admitting: General Surgery

## 2021-11-08 DIAGNOSIS — K5720 Diverticulitis of large intestine with perforation and abscess without bleeding: Secondary | ICD-10-CM

## 2021-11-15 ENCOUNTER — Ambulatory Visit
Admission: RE | Admit: 2021-11-15 | Discharge: 2021-11-15 | Disposition: A | Discharge status: Home / Self Care | Payer: Medicare Other | Source: Ambulatory Visit | Attending: General Surgery | Admitting: General Surgery

## 2021-11-15 DIAGNOSIS — K5720 Diverticulitis of large intestine with perforation and abscess without bleeding: Secondary | ICD-10-CM | POA: Diagnosis present

## 2021-11-15 MED ORDER — IOHEXOL 300 MG/ML  SOLN
600.0000 mL | Freq: Once | INTRAMUSCULAR | Status: AC | PRN
  Administered 2021-11-15: 600 mL

## 2021-11-25 ENCOUNTER — Ambulatory Visit: Payer: Self-pay | Admitting: General Surgery

## 2021-11-25 NOTE — H&P (View-Only) (Signed)
HISTORY OF PRESENT ILLNESS:    Brianna Briggs is a 71 y.o.female patient who comes for evaluation of ileostomy reversal.    Patient had partial colectomy with anastomosis and protective loop ileostomy on 07/31/2021.  There was due to complicated diverticulitis not resolving with medical treatment.  She had a very slow recovery due to malnutrition and persistent infection.  He was able to recover and in the last few months she has been getting stronger, eating well, feeling well, ambulating.  Patient stopped smoking since she was admitted to the hospital.  She has smoking about 5 months.  Today the patient denies any pain.  She denies any issues with the ileostomy.  Adequate ileostomy output.  She is taking great care of ileostomy.  Patient had Gastrografin enema that shows adequate opacification of the large intestine without any sign of stricture or leak.  I personally evaluated the images.   PAST MEDICAL HISTORY:  Past Medical History:  Diagnosis Date   Allergic state    Breast cancer (CMS-HCC) 1996   Chickenpox    Colon polyp    Diverticulitis    Heart problem    Hyperlipidemia    Migraine    Single kidney         PAST SURGICAL HISTORY:   Past Surgical History:  Procedure Laterality Date   MASTECTOMY Right 1996   HYSTERECTOMY  2000   COLONOSCOPY  05/01/2019   Tubular adenoma of the colon/Hyperplastic colon polyp/Repeat 51yr/TKT   EGD  05/01/2019   Intestinal metaplasia/Reflux esophagitis/Repeat 333yrTKT   EXPLORATORY LAPAROTOMY  07/31/2021   Dr EdLesli Albee--- ileostomy   Left subacromial decompression and rotator cuff repair. 01/13/08     TONSILLECTOMY           MEDICATIONS:  Outpatient Encounter Medications as of 11/24/2021  Medication Sig Dispense Refill   albuterol 90 mcg/actuation inhaler Inhale 2 inhalations into the lungs every 4 (four) hours as needed for Wheezing     midodrine (PROAMATINE) 10 MG tablet Take 10 mg by mouth as needed     No  facility-administered encounter medications on file as of 11/24/2021.     ALLERGIES:   Augmentin [amoxicillin-pot clavulanate], Biaxin [clarithromycin], Oxycodone, Codeine, and Macrodantin [nitrofurantoin macrocrystalline]   SOCIAL HISTORY:  Social History   Socioeconomic History   Marital status: Married    Spouse name: BrYudith Norlander Number of children: 4   Years of education: 14   Highest education level: Some college, no degree  Occupational History   Occupation: GeEngineer, civil (consulting)  Comment: DeCivil engineer, contracting retired  Tobacco Use   Smoking status: Former    Packs/day: 1.00    Years: 52.00    Additional pack years: 0.00    Total pack years: 52.00    Types: Cigarettes   Smokeless tobacco: Never   Tobacco comments:    22-25 cigarettes per day  Vaping Use   Vaping Use: Never used  Substance and Sexual Activity   Alcohol use: Not Currently   Drug use: Never   Sexual activity: Yes    Partners: Male   Social Determinants of Health   Financial Resource Strain: Low Risk  (07/29/2019)   Overall Financial Resource Strain (CARDIA)    Difficulty of Paying Living Expenses: Not hard at all  Food Insecurity: No Food Insecurity (07/29/2019)   Hunger Vital Sign    Worried About Running Out of Food in the Last Year: Never true    Ran Out of Food  in the Last Year: Never true  Transportation Needs: No Transportation Needs (07/29/2019)   PRAPARE - Hydrologist (Medical): No    Lack of Transportation (Non-Medical): No    FAMILY HISTORY:  Family History  Problem Relation Age of Onset   High blood pressure (Hypertension) Mother    Kidney failure Mother    Cancer Father    Heart disease Brother      GENERAL REVIEW OF SYSTEMS:   General ROS: negative for - chills, fatigue, fever, weight gain or weight loss Allergy and Immunology ROS: negative for - hives  Hematological and Lymphatic ROS: negative for - bleeding problems or bruising, negative  for palpable nodes Endocrine ROS: negative for - heat or cold intolerance, hair changes Respiratory ROS: negative for - cough, shortness of breath or wheezing Cardiovascular ROS: no chest pain or palpitations GI ROS: negative for nausea, vomiting, abdominal pain, diarrhea, constipation Musculoskeletal ROS: negative for - joint swelling or muscle pain Neurological ROS: negative for - confusion, syncope Dermatological ROS: negative for pruritus and rash  PHYSICAL EXAM:  Vitals:   11/24/21 1105  BP: 103/75  Pulse: 85  .  Ht:157.5 cm ('5\' 2"'$ ) Wt:54 kg (119 lb) ZYS:AYTK surface area is 1.54 meters squared. Body mass index is 21.77 kg/m.Marland Kitchen   GENERAL: Alert, active, oriented x3  HEENT: Pupils equal reactive to light. Extraocular movements are intact. Sclera clear. Palpebral conjunctiva normal red color.Pharynx clear.  NECK: Supple with no palpable mass and no adenopathy.  LUNGS: Sound clear with no rales rhonchi or wheezes.  HEART: Regular rhythm S1 and S2 without murmur.  ABDOMEN: Soft and depressible, nontender with no palpable mass, no hepatomegaly.  Loop ileostomy in the right upper quadrant, pink and patent.  EXTREMITIES: Well-developed well-nourished symmetrical with no dependent edema.  NEUROLOGICAL: Awake alert oriented, facial expression symmetrical, moving all extremities.      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  In the last few months she has been recovering adequately at home.  She is eating well.  She is having high-protein diet.  The patient is at her baseline.  She got Gastrografin enema that shows adequate opacification without sign of stricture or leak.  Patient seems to be ready for ileostomy reversal.  I discussed with patient procedure of ileostomy reversal.  Discussed with patient the risk of anastomosis leak, bowel obstruction, intra-abdominal infection, recurrence of hernia, wound infection,  bleeding, among others.  The patient reports she understood and agreed to proceed with surgery.         PLAN:  1.  Ileostomy reversal [phone]) 2.  Parastomal hernia repair [phone]) 3.  CBC, CMP 4.  Avoid taking aspirin 5 days before the surgery 5.  Contact us if you have any concern  Patient and her husband verbalized understanding, all questions were answered, and were agreeable with the plan outlined above.   Herbert Pun, MD  Electronically signed by Herbert Pun, MD

## 2021-11-25 NOTE — H&P (Signed)
HISTORY OF PRESENT ILLNESS:    [name] who comes for evaluation of ileostomy reversal.    Patient had partial colectomy with anastomosis and protective loop ileostomy on 07/31/2021.  There was due to complicated diverticulitis not resolving with medical treatment.  She had a very slow recovery due to malnutrition and persistent infection.  He was able to recover and in the last few months she has been getting stronger, eating well, feeling well, ambulating.  Patient stopped smoking since she was admitted to the hospital.  She has smoking about 5 months.  Today the patient denies any pain.  She denies any issues with the ileostomy.  Adequate ileostomy output.  She is taking great care of ileostomy.  Patient had Gastrografin enema that shows adequate opacification of the large intestine without any sign of stricture or leak.  I personally evaluated the images.   PAST MEDICAL HISTORY:  Past Medical History:  Diagnosis Date   Allergic state    Breast cancer (CMS-HCC) 1996   Chickenpox    Colon polyp    Diverticulitis    Heart problem    Hyperlipidemia    Migraine    Single kidney         PAST SURGICAL HISTORY:   Past Surgical History:  Procedure Laterality Date   MASTECTOMY Right 1996   HYSTERECTOMY  2000   COLONOSCOPY  05/01/2019   Tubular adenoma of the colon/Hyperplastic colon polyp/Repeat 3yr/TKT   EGD  05/01/2019   Intestinal metaplasia/Reflux esophagitis/Repeat 365yrTKT   EXPLORATORY LAPAROTOMY  07/31/2021   Dr EdLesli Albee--- ileostomy   Left subacromial decompression and rotator cuff repair. 01/13/08     TONSILLECTOMY           MEDICATIONS:  Outpatient Encounter Medications as of 11/24/2021  Medication Sig Dispense Refill   albuterol 90 mcg/actuation inhaler Inhale 2 inhalations into the lungs every 4 (four) hours as needed for Wheezing     midodrine (PROAMATINE) 10 MG tablet Take 10 mg by mouth as needed     No  facility-administered encounter medications on file as of 11/24/2021.     ALLERGIES:   Augmentin [amoxicillin-pot clavulanate], Biaxin [clarithromycin], Oxycodone, Codeine, and Macrodantin [nitrofurantoin macrocrystalline]   SOCIAL HISTORY:  Social History   Socioeconomic History   Marital status: Married    Spouse name: Brianna Briggs Number of children: 4   Years of education: 14   Highest education level: Some college, no degree  Occupational History   Occupation: GeEngineer, civil (consulting)  Comment: DeCivil engineer, contracting retired  Tobacco Use   Smoking status: Former    Packs/day: 1.00    Years: 52.00    Additional pack years: 0.00    Total pack years: 52.00    Types: Cigarettes   Smokeless tobacco: Never   Tobacco comments:    22-25 cigarettes per day  Vaping Use   Vaping Use: Never used  Substance and Sexual Activity   Alcohol use: Not Currently   Drug use: Never   Sexual activity: Yes    Partners: Male   Social Determinants of Health   Financial Resource Strain: Low Risk  (07/29/2019)   Overall Financial Resource Strain (CARDIA)    Difficulty of Paying Living Expenses: Not hard at all  Food Insecurity: No Food Insecurity (07/29/2019)   Hunger Vital Sign    Worried About Running Out of Food in the Last Year: Never true    Ran Out of Food  in the Last Year: Never true  Transportation Needs: No Transportation Needs (07/29/2019)   PRAPARE - Hydrologist (Medical): No    Lack of Transportation (Non-Medical): No    FAMILY HISTORY:  Family History  Problem Relation Age of Onset   High blood pressure (Hypertension) Mother    Kidney failure Mother    Cancer Father    Heart disease Brother      GENERAL REVIEW OF SYSTEMS:   General ROS: negative for - chills, fatigue, fever, weight gain or weight loss Allergy and Immunology ROS: negative for - hives  Hematological and Lymphatic ROS: negative for - bleeding problems or bruising, negative  for palpable nodes Endocrine ROS: negative for - heat or cold intolerance, hair changes Respiratory ROS: negative for - cough, shortness of breath or wheezing Cardiovascular ROS: no chest pain or palpitations GI ROS: negative for nausea, vomiting, abdominal pain, diarrhea, constipation Musculoskeletal ROS: negative for - joint swelling or muscle pain Neurological ROS: negative for - confusion, syncope Dermatological ROS: negative for pruritus and rash  PHYSICAL EXAM:  Vitals:   11/24/21 1105  BP: 103/75  Pulse: 85  .  Ht:157.5 cm ('5\' 2"'$ ) Wt:54 kg (119 lb) VXY:IAXK surface area is 1.54 meters squared. Body mass index is 21.77 kg/m.Marland Kitchen   GENERAL: Alert, active, oriented x3  HEENT: Pupils equal reactive to light. Extraocular movements are intact. Sclera clear. Palpebral conjunctiva normal red color.Pharynx clear.  NECK: Supple with no palpable mass and no adenopathy.  LUNGS: Sound clear with no rales rhonchi or wheezes.  HEART: Regular rhythm S1 and S2 without murmur.  ABDOMEN: Soft and depressible, nontender with no palpable mass, no hepatomegaly.  Loop ileostomy in the right upper quadrant, pink and patent.  EXTREMITIES: Well-developed well-nourished symmetrical with no dependent edema.  NEUROLOGICAL: Awake alert oriented, facial expression symmetrical, moving all extremities.      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  In the last few months she has been recovering adequately at home.  She is eating well.  She is having high-protein diet.  The patient is at her baseline.  She got Gastrografin enema that shows adequate opacification without sign of stricture or leak.  Patient seems to be ready for ileostomy reversal.  I discussed with patient procedure of ileostomy reversal.  Discussed with patient the risk of anastomosis leak, bowel obstruction, intra-abdominal infection, recurrence of hernia, wound infection,  bleeding, among others.  The patient reports she understood and agreed to proceed with surgery.         PLAN:  1.  Ileostomy reversal [phone]) 2.  Parastomal hernia repair [phone]) 3.  CBC, CMP 4.  Avoid taking aspirin 5 days before the surgery 5.  Contact us if you have any concern  Patient and her husband verbalized understanding, all questions were answered, and were agreeable with the plan outlined above.   Herbert Pun, MD  Electronically signed by Herbert Pun, MD

## 2021-12-02 ENCOUNTER — Encounter
Admission: RE | Admit: 2021-12-02 | Discharge: 2021-12-02 | Disposition: A | Discharge status: Home / Self Care | Payer: Medicare Other | Source: Ambulatory Visit | Attending: General Surgery | Admitting: General Surgery

## 2021-12-02 HISTORY — DX: Severe sepsis without septic shock: R65.20

## 2021-12-02 HISTORY — DX: Dermatitis, unspecified: L30.9

## 2021-12-02 HISTORY — DX: Dependence on supplemental oxygen: Z99.81

## 2021-12-02 HISTORY — DX: Atherosclerotic heart disease of native coronary artery without angina pectoris: I25.10

## 2021-12-02 HISTORY — DX: Diverticulitis of intestine, part unspecified, with perforation and abscess without bleeding: K57.80

## 2021-12-02 HISTORY — DX: Bronchitis, not specified as acute or chronic: J40

## 2021-12-02 HISTORY — DX: Tobacco use: Z72.0

## 2021-12-02 HISTORY — DX: Acidosis, unspecified: E87.20

## 2021-12-02 HISTORY — DX: Peripheral vascular disease, unspecified: I73.9

## 2021-12-02 HISTORY — DX: Atherosclerosis of aorta: I70.0

## 2021-12-02 HISTORY — DX: Acute and chronic respiratory failure with hypoxia: J96.21

## 2021-12-02 HISTORY — DX: Ventricular premature depolarization: I49.3

## 2021-12-02 HISTORY — DX: Chronic obstructive pulmonary disease, unspecified: J44.9

## 2021-12-02 HISTORY — DX: Hypotension, unspecified: I95.9

## 2021-12-02 HISTORY — DX: Major depressive disorder, single episode, unspecified: F32.9

## 2021-12-02 HISTORY — DX: Depression, unspecified: F32.A

## 2021-12-02 HISTORY — DX: Other disorders of phosphorus metabolism: E83.39

## 2021-12-02 HISTORY — DX: Diverticulitis of large intestine with perforation and abscess without bleeding: K57.20

## 2021-12-02 HISTORY — DX: Sepsis, unspecified organism: A41.9

## 2021-12-02 NOTE — Patient Instructions (Signed)
Your procedure is scheduled on:12-12-21 Monday Report to the Registration Desk on the 1st floor of the [facility].Then proceed to the 2nd floor Surgery Desk To find out your arrival time, please call [phone] between 1PM - 3PM on:12-09-21 Friday If your arrival time is 6:00 am, do not arrive prior to that time as the [facility] entrance doors do not open until 6:00 am.  REMEMBER: Instructions that are not followed completely may result in serious medical risk, up to and including death; or upon the discretion of your surgeon and anesthesiologist your surgery may need to be rescheduled.  Do not eat food OR drink any liquids after midnight the night before surgery.  No gum chewing, lozengers or hard candies.  Do NOT take any medication the day of surgery  Use your Ventolin Inhaler the day of surgery and bring your Inhaler to the hospital  One week prior to surgery: Stop Anti-inflammatories (NSAIDS) such as Advil, Aleve, Ibuprofen, Motrin, Naproxen, Naprosyn and Aspirin based products such as Excedrin, Goodys Powder, BC Powder.You may however, take Tylenol if needed for pain up until the day of surgery.  Stop ANY OVER THE COUNTER supplements/vitamins 7 days prior to surgery (Multivitamin and Vitamin D)  No Alcohol for 24 hours before or after surgery.  No Smoking including e-cigarettes for 24 hours prior to surgery.  No chewable tobacco products for at least 6 hours prior to surgery.  No nicotine patches on the day of surgery.  Do not use any "recreational" drugs for at least a week prior to your surgery.  Please be advised that the combination of cocaine and anesthesia may have negative outcomes, up to and including death. If you test positive for cocaine, your surgery will be cancelled.  On the morning of surgery brush your teeth with toothpaste and water, you may rinse your mouth with mouthwash if you wish. Do not swallow any toothpaste or mouthwash.  Use CHG Soap as  directed on instruction sheet.  Do not wear jewelry, make-up, hairpins, clips or nail polish.  Do not wear lotions, powders, or perfumes.   Do not shave body from the neck down 48 hours prior to surgery just in case you cut yourself which could leave a site for infection.  Also, freshly shaved skin may become irritated if using the CHG soap.  Contact lenses, hearing aids and dentures may not be worn into surgery.  Do not bring valuables to the hospital. [facility] is not responsible for any missing/lost belongings or valuables.   Follow Bowel prep as directed by Dr Darrol Poke office if one was given in the office  Notify your doctor if there is any change in your medical condition (cold, fever, infection).  Wear comfortable clothing (specific to your surgery type) to the hospital.  After surgery, you can help prevent lung complications by doing breathing exercises.  Take deep breaths and cough every 1-2 hours. Your doctor may order a device called an Incentive Spirometer to help you take deep breaths. When coughing or sneezing, hold a pillow firmly against your incision with both hands. This is called "splinting." Doing this helps protect your incision. It also decreases belly discomfort.  If you are being admitted to the hospital overnight, leave your suitcase in the car. After surgery it may be brought to your room.  If you are being discharged the day of surgery, you will not be allowed to drive home. You will need a responsible adult (18 years or older) to drive  you home and stay with you that night.   If you are taking public transportation, you will need to have a responsible adult (18 years or older) with you. Please confirm with your physician that it is acceptable to use public transportation.   Please call the [facility] at [phone] if you have any questions about these instructions.  Surgery Visitation Policy:  Patients undergoing a surgery  or procedure may have two family members or support persons with them as long as the person is not COVID-19 positive or experiencing its symptoms.   Inpatient Visitation:    Visiting hours are 7 a.m. to 8 p.m. Up to four visitors are allowed at one time in a patient room. The visitors may rotate out with other people during the day. One designated support person (adult) may remain overnight.  MASKING: Due to an increase in RSV rates and hospitalizations, starting Wednesday, Nov. 15, in patient care areas in which we serve newborns, infants and children, masks will be required for teammates and visitors.  Children ages 14 and under may not visit. This policy affects the following departments only:  [facility] Mother Baby Unit Newborn nursery/Special care nursery  Other areas: Masks continue to be strongly recommended for [facility] teammates, visitors and patients in all other areas. Visitation is not restricted outside of the units listed above.

## 2021-12-05 ENCOUNTER — Encounter: Payer: Self-pay | Admitting: General Surgery

## 2021-12-05 NOTE — Progress Notes (Signed)
Perioperative Services  Pre-Admission/Anesthesia Testing Clinical Review  Date: 12/06/21  Patient Demographics:  Name: Brianna Briggs DOB:   [birth year] MRN:   [id number]  Planned Surgical Procedure(s):    Case: [id number] Date/Time: 12/12/21 0956   Procedures:      ILEOSTOMY TAKEDOWN (Abdomen)     HERNIA REPAIR PARASTOMAL (Abdomen)   Anesthesia type: General   Pre-op diagnosis: Z93.2 Ileostomy in place K43.5 Parastomal hernia w/o obstruction or gangrene   Location: [facility] OR ROOM 06 / [place] [facility]   Surgeons: Herbert Pun, MD   NOTE: Available PAT nursing documentation and vital signs have been reviewed. Clinical nursing staff has updated patient's PMH/PSHx, current medication list, and drug allergies/intolerances to ensure comprehensive history available to assist in medical decision making as it pertains to the aforementioned surgical procedure and anticipated anesthetic course. Extensive review of available clinical information performed. [place] PMH and PSHx updated with any diagnoses/procedures that  may have been inadvertently omitted during her intake with the pre-admission testing department's nursing staff.  Clinical Discussion:  Brianna Briggs is a 71 y.o. female who is submitted for pre-surgical anesthesia review and clearance prior to her undergoing the above procedure. Patient is a Former Smoker (60 pack years; quit 07/2021). Pertinent PMH includes: CAD, diastolic dysfunction, PSVT, palpitations (PVCs), PVD, DOE, COPD (requires nocturnal supplemental oxygen), acute on chronic hypoxic respiratory failure, single kidney, diverticulitis with large intestinal abscess (s/p ileostomy), remote RIGHT breast cancer (s/p mastectomy), depression.  Patient is followed by cardiology Nehemiah Massed, MD). She was last seen in the cardiology clinic on 11/17/2021; notes reviewed. At the time of her clinic visit, the patient denied any chest pain, shortness of  breath, PND, orthopnea, palpitations, significant peripheral edema, vertiginous symptoms, or presyncope/syncope.  Patient with a PMH significant for cardiovascular diagnoses.  Most recent TTE was performed on 08/22/2021 revealing a normal left ventricular systolic function with an EF of 55 to 60%.  There were no regional wall motion abnormalities.  Diastolic Doppler parameters normal.  Right ventricular size and function normal.  There was trivial mitral valve regurgitation.  There was no evidence of a significant transvalvular gradient to suggest stenosis.  Blood pressure well-controlled at 120/80 without the use of pharmacological intervention.  Patient is not on any type of lipid-lowering therapies for ASCVD prevention.  Past CT imaging of the chest has revealed coronary artery calcifications and aortic atherosclerosis.  She is not diabetic.  Patient does not have an OSAH diagnosis, however does require supplemental oxygen at night due to her underlying COPD diagnosis.  Patient is jointly managed by pulmonary medicine; last seen in 02/2021. Functional capacity, as defined by DASI, is documented as being >/= 4 METS.  No changes were made to her medication regimen.  Patient to follow-up with outpatient cardiology for ongoing evaluation and management of her cardiovascular diagnoses.  [place] with known diverticular disease.  She developed an abscess and subsequent perforation back in 07/2021.  Patient ultimately taken for surgical resection on 07/31/2021, at which time she underwent sigmoid colectomy and creation of a diverting ostomy.  Patient has been scheduled for follow-up with general surgery and the decision has been made to pursue an ILEOSTOMY TAKEDOWN; HERNIA REPAIR PARASTOMAL on 12/12/2021 with Dr. Herbert Pun, MD.  Given patient's past medical history significant for cardiovascular diagnoses, presurgical cardiac clearance was sought by the PAT team. "The patient is at the lowest  risk possible for perioperative cardiovascular complications with the planned procedure.  The overall risk  her procedure is low (<1%).  Currently has no evidence active and/or significant angina and/or congestive heart failure. Patient may proceed to surgery without restriction or need for further cardiovascular testing at an overall LOW risk". In review of her medication reconciliation, the patient is not noted to be taking any type of anticoagulation or antiplatelet therapies that would need to be held during her perioperative course.  Patient denies previous perioperative complications with anesthesia in the past. In review of the available records, it is noted that patient underwent a general anesthetic course here at [facility] in 07/2021 without documented complications.      12/02/2021    3:00 PM 09/01/2021   11:43 AM 09/01/2021    7:45 AM  Vitals with BMI  Height _0     Weight 119 lbs 1 oz    BMI 99.83    Systolic  382 92  Diastolic  68 59  Pulse  47 90    Providers/Specialists:   NOTE: Primary physician provider listed below. Patient may have been seen by APP or partner within same practice.   PROVIDER ROLE / SPECIALTY LAST Tanna Savoy, MD General Surgery (Surgeon) 11/24/2021  Kirk Ruths, MD Primary Care Provider 11/09/2021  Serafina Royals, MD Cardiology 11/17/2021  Ottie Glazier, MD Pulmonary Medicine 03/14/2021   Allergies:  Amoxicillin-pot clavulanate, Biaxin [clarithromycin], Oxycodone, Codeine, and Nitrofurantoin  Current Home Medications:   No current facility-administered medications for this encounter.    Cholecalciferol 25 MCG (1000 UT) capsule   guaiFENesin-dextromethorphan (ROBITUSSIN DM) 100-10 MG/5ML syrup   mometasone (ELOCON) 0.1 % cream   Multiple Vitamin (MULTIVITAMIN) tablet   OXYGEN   VENTOLIN HFA 108 (90 Base) MCG/ACT inhaler   History:   Past Medical History:  Diagnosis  Date   Acute on chronic respiratory failure with hypoxia (HCC)    Allergic genetic state    Breast cancer, right ([place]) 1996   a.) s/p total mastectomy   Bronchitis    Chronic eczema    COPD (chronic obstructive pulmonary disease) (HCC)    Coronary artery calcification seen on CT scan    Dependence on nocturnal oxygen therapy    a.) 2 L/   Diastolic dysfunction    a.) TTE 02/28/2021: EF 50%, triv MR/TR, G1DD; b.) TEE 07/16/2021: EF 55-60%, G1DD.   Diverticulitis of large intestine with abscess without bleeding    DOE (dyspnea on exertion)    History of 2019 novel coronavirus disease (COVID-19) 08/2021   History of chicken pox    History of colon polyps    Hyperlipidemia    Hypophosphatemia    Hypotension    MDD (major depressive disorder)    Migraines    Perforated diverticulum of intestine    Peripheral vascular disease with claudication (HCC)    PSVT (paroxysmal supraventricular tachycardia)    PVC (premature ventricular contraction)    Severe sepsis with lactic acidosis (HCC)    Single kidney    Squamous cell carcinoma of skin 01/27/2019   Right inferior lateral thigh. KA-type   Thoracic aortic atherosclerosis ([place])    Tobacco use    Past Surgical History:  Procedure Laterality Date   ABDOMINAL HYSTERECTOMY     COLONOSCOPY WITH PROPOFOL N/A 05/01/2019   Procedure: COLONOSCOPY WITH PROPOFOL;  Surgeon: [place], Benay Pike, MD;  Location: [facility] ENDOSCOPY;  Service: Gastroenterology;  Laterality: N/A;   ESOPHAGOGASTRODUODENOSCOPY (EGD) WITH PROPOFOL N/A 05/01/2019   Procedure: ESOPHAGOGASTRODUODENOSCOPY (EGD) WITH PROPOFOL;  Surgeon: [place],  Benay Pike, MD;  Location: [facility] ENDOSCOPY;  Service: Gastroenterology;  Laterality: N/A;   IR CATHETER TUBE CHANGE  06/29/2021   IR RADIOLOGIST EVAL & MGMT  06/23/2021   IR RADIOLOGIST EVAL & MGMT  07/14/2021   LAPAROTOMY N/A 07/31/2021   Procedure: EXPLORATORY LAPAROTOMY;  Surgeon: Herbert Pun, MD;  Location: [facility] ORS;   Service: General;  Laterality: N/A;   MASTECTOMY Right    PARTIAL COLECTOMY N/A 07/31/2021   Procedure: ILOOP ILEOSTOMY;  Surgeon: Herbert Pun, MD;  Location: [facility] ORS;  Service: General;  Laterality: N/A;   SHOULDER ARTHROSCOPY WITH SUBACROMIAL DECOMPRESSION, ROTATOR CUFF REPAIR AND BICEP TENDON REPAIR Left 01/13/2008   TONSILLECTOMY     TUBAL LIGATION     Family History  Problem Relation Age of Onset   Cancer Father    Kidney failure Mother    Social History   Tobacco Use   Smoking status: Former    Packs/day: 1.50    Years: 40.00    Total pack years: 60.00    Types: Cigarettes    Quit date: 07/2021    Years since quitting: 0.3   Smokeless tobacco: Never  Vaping Use   Vaping Use: Never used  Substance Use Topics   Alcohol use: No   Drug use: No    Pertinent Clinical Results:  LABS: Labs reviewed: Acceptable for surgery.   Ref Range & Units 11/24/2021  WBC (White Blood Cell Count) 4.1 - 10.2 10^3/uL 7.0  RBC (Red Blood Cell Count) 4.04 - 5.48 10^6/uL 4.81  Hemoglobin 12.0 - 15.0 gm/dL 14.2  Hematocrit 35.0 - 47.0 % 43.6  MCV (Mean Corpuscular Volume) 80.0 - 100.0 fl 90.6  MCH (Mean Corpuscular Hemoglobin) 27.0 - 31.2 pg 29.5  MCHC (Mean Corpuscular Hemoglobin Concentration) 32.0 - 36.0 gm/dL 32.6  Platelet Count 150 - 450 10^3/uL 211  RDW-CV (Red Cell Distribution Width) 11.6 - 14.8 % 12.6  MPV (Mean Platelet Volume) 9.4 - 12.4 fl 11.4  Neutrophils 1.50 - 7.80 10^3/uL 4.25  Lymphocytes 1.00 - 3.60 10^3/uL 1.93  Monocytes 0.00 - 1.50 10^3/uL 0.52  Eosinophils 0.00 - 0.55 10^3/uL 0.18  Basophils 0.00 - 0.09 10^3/uL 0.08  Neutrophil % 32.0 - 70.0 % 61.0  Lymphocyte % 10.0 - 50.0 % 27.7  Monocyte % 4.0 - 13.0 % 7.5  Eosinophil % 1.0 - 5.0 % 2.6  Basophil% 0.0 - 2.0 % 1.1  Immature Granulocyte % <=0.7 % 0.1  Immature Granulocyte Count <=0.06 10^3/L 0.01  Resulting Agency  [facility]  Specimen Collected: 11/24/21 12:28   Performed by:  [place]: 11/24/21 13:56  Received From: [facility]  Result Received: 11/25/21 07:59    Ref Range & Units 11/24/2021  Glucose 70 - 110 mg/dL 77  Sodium 136 - 145 mmol/L 139  Potassium 3.6 - 5.1 mmol/L 4.3  Chloride 97 - 109 mmol/L 109  Carbon Dioxide (CO2) 22.0 - 32.0 mmol/L 24.9  Urea Nitrogen (BUN) 7 - 25 mg/dL 20  Creatinine 0.6 - 1.1 mg/dL 0.8  Glomerular Filtration Rate (eGFR) >60 mL/min/1.73sq m 78  Calcium 8.7 - 10.3 mg/dL 9.4  AST 8 - 39 U/L 20  ALT 5 - 38 U/L 17  Alk Phos (alkaline Phosphatase) 34 - 104 U/L 79  Albumin 3.5 - 4.8 g/dL 4.1  Bilirubin, Total 0.3 - 1.2 mg/dL 0.3  Protein, Total 6.1 - 7.9 g/dL 6.9  A/G Ratio 1.0 - 5.0 gm/dL 1.5  Resulting Agency  [facility]  Specimen Collected: 11/24/21 12:28   Performed by: [place]: 11/24/21 15:54  Received From: [facility] [place]  Result Received: 11/25/21 07:59   ECG: Date: 08/21/2021  Time ECG obtained: 1456 PM Rate: 67 bpm Rhythm:  Sinus rhythm with PACs Axis (leads I and aVF): Normal Intervals: PR 144 ms. QRS 94 ms. QTc 453 ms. ST segment and T wave changes: No evidence of acute ST segment elevation or depression Comparison: Similar to previous tracing obtained on 04/28/2021   IMAGING / PROCEDURES: DG BE (COLON)W SINGLE CM (SOL OR THIN BA) performed on 11/15/2021 Satisfactory opacification of the rectum and colon.  No evidence of colonic stricture.  TRANSTHORACIC ECHOCARDIOGRAM performed on 08/22/2021 Left ventricular ejection fraction, by estimation, is 55 to 60%. The left ventricle has normal function. The left ventricle has no regional wall motion abnormalities. Left ventricular diastolic parameters are indeterminate.  Right ventricular systolic function is normal. The right ventricular size is normal. Tricuspid regurgitation signal is inadequate for assessing PA pressure.  The mitral valve is normal  in structure. Trivial mitral valve regurgitation. No evidence of mitral stenosis.  The aortic valve is normal in structure. Aortic valve regurgitation is not visualized. No aortic stenosis is present.  The inferior vena cava is dilated in size with <50% respiratory variability, suggesting right atrial pressure of 15 mmHg.   CT ABDOMEN PELVIS W CONTRAST performed on 08/21/2021 Stable inflammatory changes within the lower pelvis adjacent to the site of prior sigmoid colon resection and reanastomosis. No evidence of anastomotic breakdown. Stable small right hemipelvic fluid collections consistent with abscesses. Right lower quadrant loop ileostomy, with inflammatory changes and small gas fluid level within the adjacent subcutaneous fat. This may reflect postsurgical change or small postsurgical abscess Fat containing parastomal hernia. Cholelithiasis without cholecystitis. Aortic atherosclerosis   MYOCARDIAL PERFUSION IMAGING STUDY (LEXISCAN) performed on 02/12/2021 Normal left ventricular systolic function with a hyperdynamic LVEF of 70% Normal myocardial thickening and wall motion Left ventricular cavity size normal SPECT images demonstrate homogenous tracer distribution throughout the myocardium No evidence of stress-induced myocardial ischemia or arrhythmia Normal low risk study  Impression and Plan:  Brianna Briggs has been referred for pre-anesthesia review and clearance prior to her undergoing the planned anesthetic and procedural courses. Available labs, pertinent testing, and imaging results were personally reviewed by me. This patient has been appropriately cleared by cardiology with an overall LOW risk of significant perioperative cardiovascular complications.  Based on clinical review performed today (12/06/21), barring any significant acute changes in the patient's overall condition, it is anticipated that she will be able to proceed with the planned surgical intervention. Any  acute changes in clinical condition may necessitate her procedure being postponed and/or cancelled. Patient will meet with anesthesia team (MD and/or CRNA) on the day of her procedure for preoperative evaluation/assessment. Questions regarding anesthetic course will be fielded at that time.   Pre-surgical instructions were reviewed with the patient during her PAT appointment and questions were fielded by PAT clinical staff. Patient was advised that if any questions or concerns arise prior to her procedure then she should return a call to PAT and/or her surgeon's office to discuss.  Honor Loh, MSN, APRN, FNP-C, CEN [facility]  Peri-operative Services Nurse Practitioner Phone: [phone] Fax: [phone] 12/06/21 4:02 PM  NOTE: This note has been prepared using Dragon dictation software. Despite my best ability to proofread, there is always the potential that  unintentional transcriptional errors may still occur from this process.

## 2021-12-11 MED ORDER — CEFAZOLIN SODIUM-DEXTROSE 2-4 GM/100ML-% IV SOLN
2.0000 g | INTRAVENOUS | Status: AC
  Administered 2021-12-12: 2 g via INTRAVENOUS

## 2021-12-11 MED ORDER — FAMOTIDINE 20 MG PO TABS: 20.0000 mg | ORAL_TABLET | Freq: Once | ORAL | Status: AC

## 2021-12-11 MED ORDER — ACETAMINOPHEN 500 MG PO TABS: 1000.0000 mg | ORAL_TABLET | ORAL | Status: AC

## 2021-12-11 MED ORDER — ALVIMOPAN 12 MG PO CAPS: 12.0000 mg | ORAL_CAPSULE | ORAL | Status: AC

## 2021-12-11 MED ORDER — LACTATED RINGERS IV SOLN: INTRAVENOUS | Status: DC

## 2021-12-11 MED ORDER — CELECOXIB 200 MG PO CAPS: 200.0000 mg | ORAL_CAPSULE | ORAL | Status: AC

## 2021-12-11 MED ORDER — GABAPENTIN 300 MG PO CAPS: 300.0000 mg | ORAL_CAPSULE | ORAL | Status: AC

## 2021-12-11 MED ORDER — ORAL CARE MOUTH RINSE: 15.0000 mL | Freq: Once | OROMUCOSAL | Status: AC

## 2021-12-11 MED ORDER — BUPIVACAINE LIPOSOME 1.3 % IJ SUSP: 20.0000 mL | Freq: Once | INTRAMUSCULAR | Status: DC

## 2021-12-11 MED ORDER — CHLORHEXIDINE GLUCONATE 0.12 % MT SOLN: 15.0000 mL | Freq: Once | OROMUCOSAL | Status: AC

## 2021-12-12 ENCOUNTER — Inpatient Hospital Stay
Admission: RE | Admit: 2021-12-12 | Discharge: 2021-12-16 | DRG: 331 | Disposition: A | Discharge status: Home Health | Payer: Medicare Other | Attending: General Surgery | Admitting: General Surgery

## 2021-12-12 ENCOUNTER — Encounter: Payer: Self-pay | Admitting: General Surgery

## 2021-12-12 ENCOUNTER — Inpatient Hospital Stay: Payer: Medicare Other | Admitting: Urgent Care

## 2021-12-12 ENCOUNTER — Encounter
Admission: RE | Disposition: A | Discharge status: Home Health | Payer: Self-pay | Source: Home / Self Care | Attending: General Surgery

## 2021-12-12 ENCOUNTER — Other Ambulatory Visit: Payer: Self-pay

## 2021-12-12 DIAGNOSIS — Z87891 Personal history of nicotine dependence: Secondary | ICD-10-CM | POA: Diagnosis not present

## 2021-12-12 DIAGNOSIS — Z841 Family history of disorders of kidney and ureter: Secondary | ICD-10-CM | POA: Diagnosis not present

## 2021-12-12 DIAGNOSIS — M549 Dorsalgia, unspecified: Secondary | ICD-10-CM | POA: Diagnosis not present

## 2021-12-12 DIAGNOSIS — Z8249 Family history of ischemic heart disease and other diseases of the circulatory system: Secondary | ICD-10-CM

## 2021-12-12 DIAGNOSIS — E785 Hyperlipidemia, unspecified: Secondary | ICD-10-CM | POA: Diagnosis present

## 2021-12-12 DIAGNOSIS — I700 Atherosclerosis of aorta: Secondary | ICD-10-CM | POA: Diagnosis present

## 2021-12-12 DIAGNOSIS — J449 Chronic obstructive pulmonary disease, unspecified: Secondary | ICD-10-CM | POA: Diagnosis present

## 2021-12-12 DIAGNOSIS — Z888 Allergy status to other drugs, medicaments and biological substances status: Secondary | ICD-10-CM | POA: Diagnosis not present

## 2021-12-12 DIAGNOSIS — Z79899 Other long term (current) drug therapy: Secondary | ICD-10-CM | POA: Diagnosis not present

## 2021-12-12 DIAGNOSIS — I2510 Atherosclerotic heart disease of native coronary artery without angina pectoris: Secondary | ICD-10-CM | POA: Diagnosis present

## 2021-12-12 DIAGNOSIS — Z432 Encounter for attention to ileostomy: Principal | ICD-10-CM

## 2021-12-12 DIAGNOSIS — K432 Incisional hernia without obstruction or gangrene: Secondary | ICD-10-CM | POA: Diagnosis present

## 2021-12-12 DIAGNOSIS — I739 Peripheral vascular disease, unspecified: Secondary | ICD-10-CM | POA: Diagnosis present

## 2021-12-12 DIAGNOSIS — Z853 Personal history of malignant neoplasm of breast: Secondary | ICD-10-CM | POA: Diagnosis not present

## 2021-12-12 DIAGNOSIS — Z932 Ileostomy status: Principal | ICD-10-CM

## 2021-12-12 DIAGNOSIS — Z85828 Personal history of other malignant neoplasm of skin: Secondary | ICD-10-CM | POA: Diagnosis not present

## 2021-12-12 DIAGNOSIS — Z9011 Acquired absence of right breast and nipple: Secondary | ICD-10-CM

## 2021-12-12 DIAGNOSIS — K660 Peritoneal adhesions (postprocedural) (postinfection): Secondary | ICD-10-CM | POA: Diagnosis present

## 2021-12-12 DIAGNOSIS — Z8616 Personal history of COVID-19: Secondary | ICD-10-CM

## 2021-12-12 DIAGNOSIS — Z90710 Acquired absence of both cervix and uterus: Secondary | ICD-10-CM | POA: Diagnosis not present

## 2021-12-12 HISTORY — DX: Atherosclerotic heart disease of native coronary artery without angina pectoris: I25.10

## 2021-12-12 HISTORY — DX: Supraventricular tachycardia, unspecified: I47.10

## 2021-12-12 HISTORY — PX: PARASTOMAL HERNIA REPAIR: SHX2162

## 2021-12-12 HISTORY — PX: ILEOSTOMY CLOSURE: SHX1784

## 2021-12-12 HISTORY — DX: Other forms of dyspnea: R06.09

## 2021-12-12 HISTORY — DX: Peripheral vascular disease, unspecified: I73.9

## 2021-12-12 HISTORY — DX: Migraine, unspecified, not intractable, without status migrainosus: G43.909

## 2021-12-12 HISTORY — DX: Other ill-defined heart diseases: I51.89

## 2021-12-12 SURGERY — CLOSURE, ILEOSTOMY
Anesthesia: General | Site: Abdomen

## 2021-12-12 MED ORDER — MOMETASONE FUROATE 0.1 % EX CREA: 1.0000 | TOPICAL_CREAM | CUTANEOUS | Status: DC | PRN

## 2021-12-12 MED ORDER — PANTOPRAZOLE SODIUM 40 MG IV SOLR
40.0000 mg | Freq: Every day | INTRAVENOUS | Status: DC
  Administered 2021-12-12 – 2021-12-15 (×4): 40 mg via INTRAVENOUS
  Filled 2021-12-12 (×4): qty 10

## 2021-12-12 MED ORDER — ALVIMOPAN 12 MG PO CAPS
ORAL_CAPSULE | ORAL | Status: AC
  Administered 2021-12-12: 12 mg via ORAL
  Filled 2021-12-12: qty 1

## 2021-12-12 MED ORDER — PROPOFOL 10 MG/ML IV BOLUS
INTRAVENOUS | Status: AC
  Filled 2021-12-12: qty 20

## 2021-12-12 MED ORDER — HYDROCODONE-ACETAMINOPHEN 5-325 MG PO TABS
1.0000 | ORAL_TABLET | ORAL | Status: DC | PRN
  Administered 2021-12-12 – 2021-12-15 (×8): 2 via ORAL
  Administered 2021-12-16: 1 via ORAL
  Filled 2021-12-12 (×4): qty 2
  Filled 2021-12-12: qty 1
  Filled 2021-12-12 (×6): qty 2

## 2021-12-12 MED ORDER — ACETAMINOPHEN 10 MG/ML IV SOLN
INTRAVENOUS | Status: AC
  Filled 2021-12-12: qty 100

## 2021-12-12 MED ORDER — SODIUM CHLORIDE (PF) 0.9 % IJ SOLN
INTRAMUSCULAR | Status: AC
  Filled 2021-12-12: qty 50

## 2021-12-12 MED ORDER — GABAPENTIN 300 MG PO CAPS
300.0000 mg | ORAL_CAPSULE | Freq: Two times a day (BID) | ORAL | Status: DC
  Administered 2021-12-12 – 2021-12-16 (×8): 300 mg via ORAL
  Filled 2021-12-12 (×9): qty 1

## 2021-12-12 MED ORDER — HYDROMORPHONE HCL 2 MG PO TABS: 2.0000 mg | ORAL_TABLET | ORAL | Status: DC | PRN

## 2021-12-12 MED ORDER — PHENYLEPHRINE HCL (PRESSORS) 10 MG/ML IV SOLN
INTRAVENOUS | Status: DC | PRN
  Administered 2021-12-12: 120 ug via INTRAVENOUS
  Administered 2021-12-12 (×3): 160 ug via INTRAVENOUS
  Administered 2021-12-12: 80 ug via INTRAVENOUS
  Administered 2021-12-12: 160 ug via INTRAVENOUS

## 2021-12-12 MED ORDER — LIDOCAINE HCL (CARDIAC) PF 100 MG/5ML IV SOSY
PREFILLED_SYRINGE | INTRAVENOUS | Status: DC | PRN
  Administered 2021-12-12: 60 mg via INTRAVENOUS

## 2021-12-12 MED ORDER — ENOXAPARIN SODIUM 40 MG/0.4ML IJ SOSY
40.0000 mg | PREFILLED_SYRINGE | INTRAMUSCULAR | Status: DC
  Administered 2021-12-13 – 2021-12-16 (×4): 40 mg via SUBCUTANEOUS
  Filled 2021-12-12 (×4): qty 0.4

## 2021-12-12 MED ORDER — PROPOFOL 10 MG/ML IV BOLUS
INTRAVENOUS | Status: DC | PRN
  Administered 2021-12-12: 100 mg via INTRAVENOUS

## 2021-12-12 MED ORDER — ONDANSETRON HCL 4 MG/2ML IJ SOLN
INTRAMUSCULAR | Status: AC
  Filled 2021-12-12: qty 2

## 2021-12-12 MED ORDER — FENTANYL CITRATE (PF) 100 MCG/2ML IJ SOLN
INTRAMUSCULAR | Status: AC
  Filled 2021-12-12: qty 2

## 2021-12-12 MED ORDER — CELECOXIB 200 MG PO CAPS
ORAL_CAPSULE | ORAL | Status: AC
  Administered 2021-12-12: 200 mg via ORAL
  Filled 2021-12-12: qty 1

## 2021-12-12 MED ORDER — KETAMINE HCL 50 MG/5ML IJ SOSY
PREFILLED_SYRINGE | INTRAMUSCULAR | Status: AC
  Filled 2021-12-12: qty 5

## 2021-12-12 MED ORDER — ONDANSETRON HCL 4 MG/2ML IJ SOLN: 4.0000 mg | Freq: Once | INTRAMUSCULAR | Status: DC | PRN

## 2021-12-12 MED ORDER — KETAMINE HCL 10 MG/ML IJ SOLN
INTRAMUSCULAR | Status: DC | PRN
  Administered 2021-12-12: 15 mg via INTRAVENOUS
  Administered 2021-12-12: 10 mg via INTRAVENOUS

## 2021-12-12 MED ORDER — FAMOTIDINE 20 MG PO TABS
ORAL_TABLET | ORAL | Status: AC
  Administered 2021-12-12: 20 mg via ORAL
  Filled 2021-12-12: qty 1

## 2021-12-12 MED ORDER — SODIUM CHLORIDE 0.9 % IV SOLN: INTRAVENOUS | Status: DC

## 2021-12-12 MED ORDER — BUPIVACAINE LIPOSOME 1.3 % IJ SUSP
INTRAMUSCULAR | Status: AC
  Filled 2021-12-12: qty 20

## 2021-12-12 MED ORDER — ONDANSETRON HCL 4 MG/2ML IJ SOLN
INTRAMUSCULAR | Status: DC | PRN
  Administered 2021-12-12: 4 mg via INTRAVENOUS

## 2021-12-12 MED ORDER — GABAPENTIN 300 MG PO CAPS
ORAL_CAPSULE | ORAL | Status: AC
  Administered 2021-12-12: 300 mg via ORAL
  Filled 2021-12-12: qty 1

## 2021-12-12 MED ORDER — ONDANSETRON 4 MG PO TBDP
4.0000 mg | ORAL_TABLET | Freq: Four times a day (QID) | ORAL | Status: DC | PRN
  Administered 2021-12-13: 4 mg via ORAL
  Filled 2021-12-12: qty 1

## 2021-12-12 MED ORDER — FENTANYL CITRATE (PF) 100 MCG/2ML IJ SOLN
INTRAMUSCULAR | Status: AC
  Administered 2021-12-12: 50 ug via INTRAVENOUS
  Filled 2021-12-12: qty 2

## 2021-12-12 MED ORDER — ONDANSETRON HCL 4 MG/2ML IJ SOLN: 4.0000 mg | Freq: Four times a day (QID) | INTRAMUSCULAR | Status: DC | PRN

## 2021-12-12 MED ORDER — BUPIVACAINE-EPINEPHRINE (PF) 0.5% -1:200000 IJ SOLN
INTRAMUSCULAR | Status: DC | PRN
  Administered 2021-12-12: 50 mL via INTRAMUSCULAR

## 2021-12-12 MED ORDER — CHLORHEXIDINE GLUCONATE 0.12 % MT SOLN
OROMUCOSAL | Status: AC
  Administered 2021-12-12: 15 mL via OROMUCOSAL
  Filled 2021-12-12: qty 15

## 2021-12-12 MED ORDER — NEOSTIGMINE METHYLSULFATE 10 MG/10ML IV SOLN
INTRAVENOUS | Status: AC
  Filled 2021-12-12: qty 1

## 2021-12-12 MED ORDER — CEFAZOLIN SODIUM-DEXTROSE 2-4 GM/100ML-% IV SOLN
INTRAVENOUS | Status: AC
  Filled 2021-12-12: qty 100

## 2021-12-12 MED ORDER — CELECOXIB 200 MG PO CAPS
200.0000 mg | ORAL_CAPSULE | Freq: Two times a day (BID) | ORAL | Status: DC
  Administered 2021-12-12 – 2021-12-16 (×8): 200 mg via ORAL
  Filled 2021-12-12 (×8): qty 1

## 2021-12-12 MED ORDER — ACETAMINOPHEN 10 MG/ML IV SOLN
1000.0000 mg | Freq: Once | INTRAVENOUS | Status: DC | PRN
  Administered 2021-12-12: 1000 mg via INTRAVENOUS

## 2021-12-12 MED ORDER — SUGAMMADEX SODIUM 200 MG/2ML IV SOLN
INTRAVENOUS | Status: DC | PRN
  Administered 2021-12-12: 200 mg via INTRAVENOUS

## 2021-12-12 MED ORDER — DEXAMETHASONE SODIUM PHOSPHATE 10 MG/ML IJ SOLN
INTRAMUSCULAR | Status: AC
  Filled 2021-12-12: qty 1

## 2021-12-12 MED ORDER — FENTANYL CITRATE (PF) 100 MCG/2ML IJ SOLN
INTRAMUSCULAR | Status: DC | PRN
  Administered 2021-12-12: 100 ug via INTRAVENOUS

## 2021-12-12 MED ORDER — GUAIFENESIN-DM 100-10 MG/5ML PO SYRP
10.0000 mL | ORAL_SOLUTION | ORAL | Status: DC | PRN
  Administered 2021-12-12 – 2021-12-13 (×3): 10 mL via ORAL
  Filled 2021-12-12 (×3): qty 10

## 2021-12-12 MED ORDER — BUPIVACAINE-EPINEPHRINE (PF) 0.5% -1:200000 IJ SOLN
INTRAMUSCULAR | Status: AC
  Filled 2021-12-12: qty 30

## 2021-12-12 MED ORDER — LIDOCAINE HCL (PF) 2 % IJ SOLN
INTRAMUSCULAR | Status: AC
  Filled 2021-12-12: qty 5

## 2021-12-12 MED ORDER — ROCURONIUM BROMIDE 100 MG/10ML IV SOLN
INTRAVENOUS | Status: DC | PRN
  Administered 2021-12-12: 10 mg via INTRAVENOUS
  Administered 2021-12-12: 50 mg via INTRAVENOUS
  Administered 2021-12-12: 10 mg via INTRAVENOUS

## 2021-12-12 MED ORDER — EPHEDRINE 5 MG/ML INJ
INTRAVENOUS | Status: AC
  Filled 2021-12-12: qty 5

## 2021-12-12 MED ORDER — HYDROMORPHONE HCL 1 MG/ML IJ SOLN
0.5000 mg | INTRAMUSCULAR | Status: DC | PRN
  Administered 2021-12-12: 0.5 mg via INTRAVENOUS
  Filled 2021-12-12: qty 0.5

## 2021-12-12 MED ORDER — FENTANYL CITRATE (PF) 100 MCG/2ML IJ SOLN
25.0000 ug | INTRAMUSCULAR | Status: DC | PRN
  Administered 2021-12-12 (×2): 25 ug via INTRAVENOUS

## 2021-12-12 MED ORDER — ALBUTEROL SULFATE (2.5 MG/3ML) 0.083% IN NEBU: 2.5000 mg | INHALATION_SOLUTION | Freq: Every evening | RESPIRATORY_TRACT | Status: DC

## 2021-12-12 MED ORDER — EPHEDRINE SULFATE (PRESSORS) 50 MG/ML IJ SOLN
INTRAMUSCULAR | Status: DC | PRN
  Administered 2021-12-12 (×2): 5 mg via INTRAVENOUS

## 2021-12-12 MED ORDER — 0.9 % SODIUM CHLORIDE (POUR BTL) OPTIME
TOPICAL | Status: DC | PRN
  Administered 2021-12-12: 500 mL

## 2021-12-12 MED ORDER — ROCURONIUM BROMIDE 10 MG/ML (PF) SYRINGE
PREFILLED_SYRINGE | INTRAVENOUS | Status: AC
  Filled 2021-12-12: qty 10

## 2021-12-12 MED ORDER — ALBUTEROL SULFATE (2.5 MG/3ML) 0.083% IN NEBU
2.5000 mg | INHALATION_SOLUTION | Freq: Every evening | RESPIRATORY_TRACT | Status: DC
  Administered 2021-12-12 – 2021-12-15 (×4): 2.5 mg via RESPIRATORY_TRACT
  Filled 2021-12-12 (×4): qty 3

## 2021-12-12 MED ORDER — ACETAMINOPHEN 500 MG PO TABS
ORAL_TABLET | ORAL | Status: AC
  Administered 2021-12-12: 1000 mg via ORAL
  Filled 2021-12-12: qty 2

## 2021-12-12 MED ORDER — DEXAMETHASONE SODIUM PHOSPHATE 10 MG/ML IJ SOLN
INTRAMUSCULAR | Status: DC | PRN
  Administered 2021-12-12: 10 mg via INTRAVENOUS

## 2021-12-12 SURGICAL SUPPLY — 78 items
ADH SKN CLS APL DERMABOND .7 (GAUZE/BANDAGES/DRESSINGS)
APL PRP STRL LF DISP 70% ISPRP (MISCELLANEOUS) ×1
BLADE SURG 15 STRL LF DISP TIS (BLADE) ×2 IMPLANT
BLADE SURG 15 STRL SS (BLADE) ×1
CHLORAPREP W/TINT 26 (MISCELLANEOUS) ×2 IMPLANT
DERMABOND ADVANCED .7 DNX12 (GAUZE/BANDAGES/DRESSINGS) ×2 IMPLANT
DRAPE INCISE IOBAN 66X45 STRL (DRAPES) IMPLANT
DRAPE LAPAROTOMY 100X77 ABD (DRAPES) ×2 IMPLANT
DRAPE LEGGINS SURG 28X43 STRL (DRAPES) ×2 IMPLANT
DRAPE UNDER BUTTOCK W/FLU (DRAPES) ×2 IMPLANT
DRSG TELFA 3X8 NADH STRL (GAUZE/BANDAGES/DRESSINGS) ×2 IMPLANT
ELECT BLADE 6 FLAT ULTRCLN (ELECTRODE) ×2 IMPLANT
ELECT REM PT RETURN 9FT ADLT (ELECTROSURGICAL) ×1
ELECTRODE REM PT RTRN 9FT ADLT (ELECTROSURGICAL) ×2 IMPLANT
GAUZE 4X4 16PLY ~~LOC~~+RFID DBL (SPONGE) ×2 IMPLANT
GAUZE SPONGE 4X4 12PLY STRL (GAUZE/BANDAGES/DRESSINGS) IMPLANT
GLOVE BIO SURGEON STRL SZ 6.5 (GLOVE) ×6 IMPLANT
GLOVE BIOGEL PI IND STRL 6.5 (GLOVE) ×4 IMPLANT
GLOVE BIOGEL PI IND STRL 7.0 (GLOVE) ×4 IMPLANT
GLOVE SURG SYN 6.5 ES PF (GLOVE) IMPLANT
GLOVE SURG SYN 6.5 PF PI (GLOVE) ×6 IMPLANT
GOWN STRL REUS W/ TWL LRG LVL3 (GOWN DISPOSABLE) ×12 IMPLANT
GOWN STRL REUS W/TWL LRG LVL3 (GOWN DISPOSABLE) ×6
KIT TURNOVER KIT A (KITS) ×2 IMPLANT
LABEL OR SOLS (LABEL) ×2 IMPLANT
LIGASURE IMPACT 36 18CM CVD LR (INSTRUMENTS) ×2 IMPLANT
MANIFOLD NEPTUNE II (INSTRUMENTS) ×2 IMPLANT
MESH PHASIX RESORB RECT 15X20 (Mesh General) IMPLANT
NDL HYPO 25X1 1.5 SAFETY (NEEDLE) ×2 IMPLANT
NEEDLE HYPO 25X1 1.5 SAFETY (NEEDLE) ×1 IMPLANT
NS IRRIG 1000ML POUR BTL (IV SOLUTION) ×2 IMPLANT
NS IRRIG 500ML POUR BTL (IV SOLUTION) ×2 IMPLANT
PACK BASIN MAJOR ARMC (MISCELLANEOUS) ×2 IMPLANT
PACK BASIN MINOR ARMC (MISCELLANEOUS) ×2 IMPLANT
PACKING GAUZE IODOFORM 1INX5YD (GAUZE/BANDAGES/DRESSINGS) IMPLANT
PAD PREP 24X41 OB/GYN DISP (PERSONAL CARE ITEMS) ×2 IMPLANT
PDS 2-0 CT-1 Lawson 108254 IMPLANT
RELOAD BL CONTOUR (ENDOMECHANICALS) IMPLANT
RELOAD LINEAR CUT PROX 55 BLUE (ENDOMECHANICALS) ×2 IMPLANT
RELOAD PROXIMATE 75MM BLUE (ENDOMECHANICALS) IMPLANT
RELOAD STAPLE 40 BLU REG (ENDOMECHANICALS) IMPLANT
RELOAD STAPLE 55 3.8 BLU REG (ENDOMECHANICALS) IMPLANT
RELOAD STAPLE 75 3.8 BLU REG (ENDOMECHANICALS) IMPLANT
SET YANKAUER POOLE SUCT (MISCELLANEOUS) ×2 IMPLANT
SPONGE KITTNER 5P (MISCELLANEOUS) ×2 IMPLANT
SPONGE T-LAP 18X18 ~~LOC~~+RFID (SPONGE) ×6 IMPLANT
STAPLER CIRCULAR MANUAL XL 29 (STAPLE) ×2 IMPLANT
STAPLER CVD CUT BL 40 RELOAD (ENDOMECHANICALS) IMPLANT
STAPLER CVD CUT BLU 40 RELOAD (ENDOMECHANICALS) IMPLANT
STAPLER PROXIMATE 55 BLUE (STAPLE) IMPLANT
STAPLER PROXIMATE 75MM BLUE (STAPLE) IMPLANT
STAPLER SKIN PROX 35W (STAPLE) ×2 IMPLANT
SUT ETH BLK MONO 3 0 FS 1 12/B (SUTURE) ×2 IMPLANT
SUT MNCRL 4-0 (SUTURE) ×1
SUT MNCRL 4-0 27XMFL (SUTURE) ×1
SUT PDS PLUS 0 (SUTURE) ×1
SUT PDS PLUS 2 (SUTURE) ×1
SUT PDS PLUS AB 0 CT-2 (SUTURE) ×2 IMPLANT
SUT PDS PLUS AB 2-0 CT-1 (SUTURE) IMPLANT
SUT PROLENE 0 CT 1 30 (SUTURE) ×8 IMPLANT
SUT PROLENE 0 CT 2 (SUTURE) ×4 IMPLANT
SUT SILK 3-0 (SUTURE) ×2 IMPLANT
SUT STRATAFIX PDS 30 CT-1 (SUTURE) IMPLANT
SUT V-LOC 90 ABS 3-0 VLT  V-20 (SUTURE) ×1
SUT V-LOC 90 ABS 3-0 VLT V-20 (SUTURE) IMPLANT
SUT VIC AB 2-0 BRD 54 (SUTURE) ×2 IMPLANT
SUT VIC AB 2-0 SH 27 (SUTURE) ×1
SUT VIC AB 2-0 SH 27XBRD (SUTURE) ×2 IMPLANT
SUT VIC AB 3-0 54X BRD REEL (SUTURE) ×2 IMPLANT
SUT VIC AB 3-0 BRD 54 (SUTURE) ×1
SUT VIC AB 3-0 SH 27 (SUTURE) ×2
SUT VIC AB 3-0 SH 27X BRD (SUTURE) ×4 IMPLANT
SUTURE MNCRL 4-0 27XMF (SUTURE) ×2 IMPLANT
SYR 10ML LL (SYRINGE) ×2 IMPLANT
SYR BULB IRRIG 60ML STRL (SYRINGE) ×2 IMPLANT
TRAP FLUID SMOKE EVACUATOR (MISCELLANEOUS) ×2 IMPLANT
TRAY FOLEY MTR SLVR 16FR STAT (SET/KITS/TRAYS/PACK) ×2 IMPLANT
WATER STERILE IRR 500ML POUR (IV SOLUTION) ×2 IMPLANT

## 2021-12-12 NOTE — Op Note (Signed)
Preoperative diagnosis: Ileostomy status                                            Parastomal hernia  Postoperative diagnosis: Ileostomy status.                                             Incisional hernia   Procedure: Closure of loop ileostomy with small bowel resection.                     Repair of incisional hernia with myocutaneous flap and mesh placement   Anesthesia: GETA  Surgeon: Dr. Windell Moment, MD  Wound Classification: Contaminated  Indications:  Patient is a 71 y.o. female who underwent partial colectomy with proximal diverting loop ileostomy 4 months ago. Contrast studies revealed a securely healed distal anastomosis.  Findings: 1. Healthy proximal and distal small intestine 2. 3 cm incisional hernia and the ileostomy site  Description of procedure: The patient was placed in a supine position. The procedure was performed under general anesthesia. The ileostomy bag was removed, the ileostomy site cleaned, then the abdomen was prepped and draped in a sterile manner. A time-out was completed verifying correct patient, procedure, site, positioning, and implant(s) and/or special equipment prior to beginning this procedure. An circular skin incision around the ileostomy was performed. The incision was deepened in the subcutaneous tissue until the serosa of the emerging bowel appeared. Sharp dissection was continued circumferentially in this plane, dividing the fine adhesions between the bowel and its mesentery and the subcutaneous fat until the fascia was reached, and the peritoneal cavity entered, after which it was feasible to bring the emerging ileal loop through the wound. The proximal and distal transection site were identified and divided with linear cutter. Then the small bowel was placed side to side and anastomosis was done with GIA. Enterotomy was closed with 3-0 Vlock.  The site was checked for hemostasis, which was adequate, the loop of ileum was returned back to the  abdominal cavity.  Then attention was focused on the incisional hernia. The myocutaneous flap of the right rectus muscle was done separating the anterior and posterior fascia from the rectus abdominis.  The posterior fascia was closed with 2-0 PDS.  An 8 x 8 Phasix mesh was placed in the retrorectus position.  The anterior fascia was closed with 0 STRATAFIX.  The subcutaneous space was thoroughly irrigated with saline.   Hemostasis was secured, and skin closed in a pursestring fashion leaving a small opening in the center of the wound for drainage.   The patient tolerated the procedure well and was transferred to the postanesthesia care unit in stable condition.   Specimen: Small bowel  Complications: None  Estimated Blood Loss: 10 mL

## 2021-12-12 NOTE — Anesthesia Procedure Notes (Signed)
Procedure Name: Intubation Date/Time: 12/12/2021 10:36 AM  Performed by: Adalay Azucena, [place], CRNAPre-anesthesia Checklist: Patient identified, Patient being monitored, Timeout performed, Emergency Drugs available and Suction available Patient Re-evaluated:Patient Re-evaluated prior to induction Oxygen Delivery Method: Circle system utilized Preoxygenation: Pre-oxygenation with 100% oxygen Induction Type: IV induction Ventilation: Mask ventilation without difficulty Laryngoscope Size: Mac and 3 Grade View: Grade I Tube type: Oral Tube size: 6.5 mm Number of attempts: 1 Airway Equipment and Method: Stylet Placement Confirmation: ETT inserted through vocal cords under direct vision, positive ETCO2 and breath sounds checked- equal and bilateral Secured at: 21 cm Tube secured with: Tape Dental Injury: Teeth and Oropharynx as per pre-operative assessment

## 2021-12-12 NOTE — Anesthesia Preprocedure Evaluation (Signed)
Anesthesia Evaluation  Patient identified by MRN, date of birth, ID band Patient awake    Reviewed: Allergy & Precautions, NPO status , Patient's Chart, lab work & pertinent test results  Airway Mallampati: II  TM Distance: >3 FB Neck ROM: Full    Dental  (+) Missing, Loose, Poor Dentition,    Pulmonary shortness of breath and with exertion, COPD,  COPD inhaler and oxygen dependent, Patient abstained from smoking.Not current smoker, former smoker Nocturnal oxygen  2L [place]   + decreased breath sounds      Cardiovascular hypertension, + CAD and + Peripheral Vascular Disease  Normal cardiovascular exam Rhythm:Regular Rate:Normal - Systolic murmurs TTE 0962:  1. Left ventricular ejection fraction, by estimation, is 55 to 60%. The  left ventricle has normal function. The left ventricle has no regional  wall motion abnormalities. Left ventricular diastolic parameters are  indeterminate.   2. Right ventricular systolic function is normal. The right ventricular  size is normal. Tricuspid regurgitation signal is inadequate for assessing  PA pressure.   3. The mitral valve is normal in structure. Trivial mitral valve  regurgitation. No evidence of mitral stenosis.   4. The aortic valve is normal in structure. Aortic valve regurgitation is  not visualized. No aortic stenosis is present.   5. The inferior vena cava is dilated in size with <50% respiratory  variability, suggesting right atrial pressure of 15 mmHg.     Neuro/Psych  Headaches PSYCHIATRIC DISORDERS  Depression       GI/Hepatic Neg liver ROS,neg GERD  ,,Diverticulitis   Endo/Other  negative endocrine ROS    Renal/GU Renal diseasesolitary kidney  Female GU complaint     Musculoskeletal  (+) Arthritis , Osteoarthritis,    Abdominal Normal abdominal exam  (+)   Peds negative pediatric ROS (+)  Hematology negative hematology ROS (+)   Anesthesia Other Findings 25g  albumin this am  Past Medical History: No date: Allergic genetic state 1996: Breast cancer, right ([place])     Comment:  Mastectomy No date: History of chicken pox No date: History of colon polyps No date: Hyperlipidemia No date: Single kidney  Reproductive/Obstetrics                              Anesthesia Physical Anesthesia Plan  ASA: 3  Anesthesia Plan: General   Post-op Pain Management: Celebrex PO (pre-op)*, Tylenol PO (pre-op)* and Gabapentin PO (pre-op)*   Induction: Intravenous  PONV Risk Score and Plan: 4 or greater and Ondansetron, Dexamethasone and Treatment may vary due to age or medical condition  Airway Management Planned: Oral ETT  Additional Equipment: None  Intra-op Plan:   Post-operative Plan: Extubation in OR  Informed Consent: I have reviewed the patients History and Physical, chart, labs and discussed the procedure including the risks, benefits and alternatives for the proposed anesthesia with the patient or authorized representative who has indicated his/her understanding and acceptance.     Dental advisory given  Plan Discussed with: CRNA and Surgeon  Anesthesia Plan Comments: (Discussed risks of anesthesia with patient, including PONV, sore throat, lip/dental/eye damage, post operative cognitive dysfunction. Rare risks discussed as well, such as cardiorespiratory and neurological sequelae, and allergic reactions. Discussed the role of CRNA in patient's perioperative care. Patient understands.)         Anesthesia Quick Evaluation

## 2021-12-12 NOTE — Transfer of Care (Signed)
Immediate Anesthesia Transfer of Care Note  Patient: Brianna Briggs  Procedure(s) Performed: ILEOSTOMY TAKEDOWN (Abdomen) HERNIA REPAIR PARASTOMAL (Abdomen)  Patient Location: PACU  Anesthesia Type:General  Level of Consciousness: drowsy  Airway & Oxygen Therapy: Patient Spontanous Breathing and Patient connected to face mask oxygen  Post-op Assessment: Report given to RN and Post -op Vital signs reviewed and stable  Post vital signs: Reviewed and stable  Last Vitals:  Vitals Value Taken Time  BP 126/79 12/12/21 1157  Temp    Pulse 94 12/12/21 1200  Resp 13 12/12/21 1200  SpO2 99 % 12/12/21 1200  Vitals shown include unvalidated device data.  Last Pain:  Vitals:   12/12/21 0857  TempSrc: Temporal  PainSc: 0-No pain         Complications: No notable events documented.

## 2021-12-12 NOTE — Interval H&P Note (Signed)
History and Physical Interval Note:  12/12/2021 9:30 AM  Brianna Briggs  has presented today for surgery, with the diagnosis of Z93.2 Ileostomy in place K43.5 Parastomal hernia w/o obstruction or gangrene.  The various methods of treatment have been discussed with the patient and family. After consideration of risks, benefits and other options for treatment, the patient has consented to  Procedure(s): ILEOSTOMY TAKEDOWN (N/A) HERNIA REPAIR PARASTOMAL (N/A) as a surgical intervention.  The patient's history has been reviewed, patient examined, no change in status, stable for surgery.  I have reviewed the patient's chart and labs.  Questions were answered to the patient's satisfaction.     Herbert Pun

## 2021-12-12 NOTE — Anesthesia Postprocedure Evaluation (Signed)
Anesthesia Post Note  Patient: Brianna Briggs  Procedure(s) Performed: ILEOSTOMY TAKEDOWN (Abdomen) HERNIA REPAIR PARASTOMAL (Abdomen)  Patient location during evaluation: PACU Anesthesia Type: General Level of consciousness: awake and alert Pain management: pain level controlled Vital Signs Assessment: post-procedure vital signs reviewed and stable Respiratory status: spontaneous breathing, nonlabored ventilation, respiratory function stable and patient connected to nasal cannula oxygen Cardiovascular status: blood pressure returned to baseline and stable Postop Assessment: no apparent nausea or vomiting Anesthetic complications: no   No notable events documented.   Last Vitals:  Vitals:   12/12/21 1347 12/12/21 1450  BP: 112/68 107/72  Pulse: 87 90  Resp: 16 15  Temp: (!) 36.4 C (!) 36.4 C  SpO2: 96% 96%    Last Pain:  Vitals:   12/12/21 1450  TempSrc: Axillary  PainSc: 9                  Arita Miss

## 2021-12-13 ENCOUNTER — Encounter: Payer: Self-pay | Admitting: General Surgery

## 2021-12-13 LAB — CBC
HCT: 36.4 % (ref 36.0–46.0)
Hemoglobin: 11.8 g/dL — ABNORMAL LOW (ref 12.0–15.0)
MCH: 30 pg (ref 26.0–34.0)
MCHC: 32.4 g/dL (ref 30.0–36.0)
MCV: 92.6 fL (ref 80.0–100.0)
Platelets: 177 10*3/uL (ref 150–400)
RBC: 3.93 MIL/uL (ref 3.87–5.11)
RDW: 13.1 % (ref 11.5–15.5)
WBC: 10.9 10*3/uL — ABNORMAL HIGH (ref 4.0–10.5)
nRBC: 0 % (ref 0.0–0.2)

## 2021-12-13 LAB — BASIC METABOLIC PANEL
Anion gap: 5 (ref 5–15)
BUN: 22 mg/dL (ref 8–23)
CO2: 21 mmol/L — ABNORMAL LOW (ref 22–32)
Calcium: 8.4 mg/dL — ABNORMAL LOW (ref 8.9–10.3)
Chloride: 111 mmol/L (ref 98–111)
Creatinine, Ser: 0.89 mg/dL (ref 0.44–1.00)
GFR, Estimated: >60 mL/min (ref >60)
Glucose, Bld: 109 mg/dL — ABNORMAL HIGH (ref 70–99)
Potassium: 4.5 mmol/L (ref 3.5–5.1)
Sodium: 137 mmol/L (ref 135–145)

## 2021-12-13 LAB — SURGICAL PATHOLOGY

## 2021-12-13 NOTE — Progress Notes (Signed)
Mobility Specialist - Progress Note   12/13/21 1515  Mobility  Activity Ambulated independently in room;Ambulated independently in hallway  Level of Assistance Modified independent, requires aide device or extra time  Assistive Device None  Distance Ambulated (ft) 260 ft  Activity Response Tolerated well  Mobility Referral Yes  $Mobility charge 1 Mobility   MS responding to chair alarm. Pt in bathroom upon entry, utilizing RA. Pt STS and amb ModI (extra time given). Pt ambulated two laps around NS without AD, after approximately 240 ft pt voiced "feeling a little tired". Pt returned to room, left supine with alarm set and needs within reach.   Candie Mile Mobility Specialist 12/13/21 3:20 PM

## 2021-12-13 NOTE — Evaluation (Signed)
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 History of admission for similar issues back in Aug 2023.  Clinical Impression  Pt is a pleasant 71 year old female who was admitted for ileostomy status s/p surgery on 12/12/21. Pt performs bed mobility with independence, transfers with mod I, and ambulation with cga and holding onto IV pole. Pt demonstrates deficits with endurance/balance/mobility. Would anticipate potential need for O2 with exertion as sats decrease to 84% on RA, however recover quickly with standing rest breaks. Would benefit from skilled PT to address above deficits and promote optimal return to PLOF. Recommend transition to [place] upon discharge from acute hospitalization.       Recommendations for follow up therapy are one component of a multi-disciplinary discharge planning process, led by the attending physician.  Recommendations may be updated based on patient status, additional functional criteria and insurance authorization.  Follow Up Recommendations Home health PT      Assistance Recommended at Discharge PRN  Patient can return home with the following  A little help with walking and/or transfers;Help with stairs or ramp for entrance    Equipment Recommendations None recommended by PT  Recommendations for Other Services       Functional Status Assessment Patient has had a recent decline in their functional status and demonstrates the ability to make significant improvements in function in a reasonable and predictable amount of time.     Precautions / Restrictions Precautions Precautions: Fall Restrictions Weight Bearing Restrictions: No      Mobility  Bed Mobility Overal bed mobility: Independent              General bed mobility comments: received seated at EOB, reports independence with bed mobility    Transfers Overall transfer level: Modified independent Equipment used: None               General transfer comment: upright posture with limited pain. Safe technique    Ambulation/Gait Ambulation/Gait assistance: Min Conservator, museum/gallery (Feet): 200 Feet Assistive device: IV Pole Gait Pattern/deviations: Step-through pattern       General Gait Details: ambulated performed with pt pushing IV pole. All mobility performed on RA with sats decreasing to 84% with exertion. With standing rest breaks, able to improve to 89%. No SOB symptoms  Stairs            Wheelchair Mobility    Modified Rankin (Stroke Patients Only)       Balance Overall balance assessment: Needs assistance Sitting-balance support: Feet supported Sitting balance-Leahy Scale: Good     Standing balance support: Bilateral upper extremity supported Standing balance-Leahy Scale: Fair                               Pertinent Vitals/Pain Pain Assessment Pain Assessment: Faces Faces Pain Scale: Hurts a little bit Pain Location: abdomen Pain Descriptors / Indicators: Aching, Operative site guarding Pain Intervention(s): Limited activity within patient's tolerance    Home Living Family/patient expects to be discharged to:: Private residence Living Arrangements: Spouse/significant other Available Help at Discharge: Family Type of Home: House Home Access: Stairs to enter Entrance Stairs-Rails: None Entrance Stairs-Number of Steps: 2   Home Layout: Two level;Able to live on main level with bedroom/bathroom Home Equipment:  Rolling Walker (2 wheels);BSC/3in1      Prior Function Prior Level of Function : Independent/Modified Independent             Mobility Comments: reports no falls, indep at baseline.       Hand Dominance        Extremity/Trunk Assessment   Upper Extremity  Assessment Upper Extremity Assessment: Overall WFL for tasks assessed    Lower Extremity Assessment Lower Extremity Assessment: Overall WFL for tasks assessed       Communication   Communication: No difficulties  Cognition Arousal/Alertness: Awake/alert Behavior During Therapy: WFL for tasks assessed/performed Overall Cognitive Status: Within Functional Limits for tasks assessed                                 General Comments: pleasant and agreeable to session        General Comments      Exercises Other Exercises Other Exercises: ambulated to bathroom with supervision. Needs assistance for IV pole. Able to perform hygiene with mod I   Assessment/Plan    PT Assessment Patient needs continued PT services  PT Problem List Decreased balance;Decreased mobility;Pain       PT Treatment Interventions DME instruction;Gait training;Balance training    PT Goals (Current goals can be found in the Care Plan section)  Acute Rehab PT Goals Patient Stated Goal: to go home PT Goal Formulation: With patient Time For Goal Achievement: 12/27/21 Potential to Achieve Goals: Good    Frequency Min 2X/week     Co-evaluation               AM-PAC PT "6 Clicks" Mobility  Outcome Measure Help needed turning from your back to your side while in a flat bed without using bedrails?: None Help needed moving from lying on your back to sitting on the side of a flat bed without using bedrails?: None Help needed moving to and from a bed to a chair (including a wheelchair)?: None Help needed standing up from a chair using your arms (e.g., wheelchair or bedside chair)?: None Help needed to walk in hospital room?: None Help needed climbing 3-5 steps with a railing? : A Little 6 Click Score: 23    End of Session   Activity Tolerance: Patient tolerated treatment well Patient left: in chair;with chair alarm set Nurse Communication: Mobility status PT Visit Diagnosis:  Pain Pain - Right/Left:  (abdominal) Pain - part of body:  (abdominal)    Time: 7106-2694 PT Time Calculation (min) (ACUTE ONLY): 23 min   Charges:   PT Evaluation $PT Eval Low Complexity: 1 Low PT Treatments $Gait Training: 8-22 mins        Greggory Stallion, PT, DPT, GCS [phone]   Kalinda Romaniello 12/13/2021, 2:20 PM

## 2021-12-13 NOTE — Progress Notes (Signed)
Patient ID: Brianna Briggs, female   DOB: 17-Aug-1950, 71 y.o.   MRN: [id number]     [place] Hospital Day(s): 1.   Interval History: Patient seen and examined, no acute events or new complaints overnight. Patient reports having more back pain but abdominal pain.  She endorses passing gas.  She denies any nausea or vomiting.  Vital signs in last 24 hours: [min-max] current  Temp:  [97.2 F (36.2 C)-97.9 F (36.6 C)] 97.8 F (36.6 C) (11/28 0720) Pulse Rate:  [84-97] 84 (11/28 0720) Resp:  [6-24] 16 (11/28 0720) BP: (93-126)/(54-79) 99/61 (11/28 0720) SpO2:  [93 %-99 %] 95 % (11/28 0720)     Height: '5\' 2"'$  (157.5 cm) Weight: 54 kg BMI (Calculated): 21.77   Physical Exam:  Constitutional: alert, cooperative and no distress  Respiratory: breathing non-labored at rest  Cardiovascular: regular rate and sinus rhythm  Gastrointestinal: soft, non-tender, and non-distended  Labs:     Latest Ref Rng & Units 12/13/2021    5:44 AM 08/31/2021    6:23 AM 08/30/2021    4:58 AM  CBC  WBC 4.0 - 10.5 K/uL 10.9  7.0  9.2   Hemoglobin 12.0 - 15.0 g/dL 11.8  9.6  9.6   Hematocrit 36.0 - 46.0 % 36.4  30.0  29.9   Platelets 150 - 400 K/uL 177  162  176       Latest Ref Rng & Units 12/13/2021    5:44 AM 08/31/2021    6:23 AM 08/30/2021    4:58 AM  CMP  Glucose 70 - 99 mg/dL 109  84  85   BUN 8 - 23 mg/dL '22  13  16   '$ Creatinine 0.44 - 1.00 mg/dL 0.89  0.67  0.62   Sodium 135 - 145 mmol/L 137  140  138   Potassium 3.5 - 5.1 mmol/L 4.5  3.6  4.2   Chloride 98 - 111 mmol/L 111  103  102   CO2 22 - 32 mmol/L 21  32  30   Calcium 8.9 - 10.3 mg/dL 8.4  8.4  8.4     Imaging studies: No new pertinent imaging studies   Assessment/Plan:  71 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  -Patient recovering adequately -She is having a lot of back pain causing weakness.  Will consult PT for further evaluation and management -No nausea or vomiting.   Passing gas.  Tolerating clear liquid diet.  Will advance to full liquids. -I encouraged the patient to ambulate  Arnold Long, MD

## 2021-12-13 NOTE — Progress Notes (Signed)
   12/13/21 1542  Vitals  Temp 98.6 F (37 C)  Temp Source Oral  BP (!) 87/62  MAP (mmHg) 70  BP Location Left Arm  BP Method Automatic  Patient Position (if appropriate) Lying  Pulse Rate 76  Pulse Rate Source Monitor  Resp 16   [facility] notified via secure chat. No new orders.

## 2021-12-13 NOTE — TOC CM/SW Note (Addendum)
Patient is active with [facility] for nursing only.  Dayton Scrape, CSW [phone]  3:43 pm: [facility] can add PT.  Dayton Scrape, [place]

## 2021-12-14 LAB — BASIC METABOLIC PANEL
Anion gap: 4 — ABNORMAL LOW (ref 5–15)
BUN: 21 mg/dL (ref 8–23)
CO2: 21 mmol/L — ABNORMAL LOW (ref 22–32)
Calcium: 8.4 mg/dL — ABNORMAL LOW (ref 8.9–10.3)
Chloride: 118 mmol/L — ABNORMAL HIGH (ref 98–111)
Creatinine, Ser: 0.93 mg/dL (ref 0.44–1.00)
GFR, Estimated: >60 mL/min (ref >60)
Glucose, Bld: 89 mg/dL (ref 70–99)
Potassium: 4 mmol/L (ref 3.5–5.1)
Sodium: 143 mmol/L (ref 135–145)

## 2021-12-14 LAB — CBC
HCT: 31.3 % — ABNORMAL LOW (ref 36.0–46.0)
Hemoglobin: 10.2 g/dL — ABNORMAL LOW (ref 12.0–15.0)
MCH: 29.6 pg (ref 26.0–34.0)
MCHC: 32.6 g/dL (ref 30.0–36.0)
MCV: 90.7 fL (ref 80.0–100.0)
Platelets: 133 10*3/uL — ABNORMAL LOW (ref 150–400)
RBC: 3.45 MIL/uL — ABNORMAL LOW (ref 3.87–5.11)
RDW: 13.6 % (ref 11.5–15.5)
WBC: 7.3 10*3/uL (ref 4.0–10.5)
nRBC: 0 % (ref 0.0–0.2)

## 2021-12-14 NOTE — Progress Notes (Signed)
Physical Therapy Treatment Patient Details Name: Brianna Briggs MRN: [id number] DOB: 07-29-1950 Today's Date: 12/14/2021   History of Present Illness Pt admitted for ileostomy status s/p surgery yesterday for closure of loop ileostomy with small bowel resection and repair of hernia. History of admission for similar issues back in Aug 2023.    PT Comments    Pt is making good progress towards goals with ability to ambulate around RN station without AD. O2 donned this session with sats at 89-92% with exertion. Pt encouraged to continue ambulation. Pt with complaints of bowel urgency. Will continue to progress.   Recommendations for follow up therapy are one component of a multi-disciplinary discharge planning process, led by the attending physician.  Recommendations may be updated based on patient status, additional functional criteria and insurance authorization.  Follow Up Recommendations  Home health PT     Assistance Recommended at Discharge PRN  Patient can return home with the following A little help with walking and/or transfers;Help with stairs or ramp for entrance   Equipment Recommendations  None recommended by PT    Recommendations for Other Services       Precautions / Restrictions Precautions Precautions: Fall Restrictions Weight Bearing Restrictions: No     Mobility  Bed Mobility Overal bed mobility: Independent             General bed mobility comments: safe technique    Transfers Overall transfer level: Independent Equipment used: None               General transfer comment: safe technique    Ambulation/Gait Ambulation/Gait assistance: Modified independent (Device/Increase time) Gait Distance (Feet): 200 Feet Assistive device: None Gait Pattern/deviations: Step-through pattern       General Gait Details: ambulated with good speed and pattern with reciprocal gait. 2L of O2 donned with exertion with sats decreasing to 89%-91%. With  seated rest break, improved to 93%. No complaints of SOB symptoms.   Stairs             Wheelchair Mobility    Modified Rankin (Stroke Patients Only)       Balance Overall balance assessment: Needs assistance Sitting-balance support: Feet supported Sitting balance-Leahy Scale: Good     Standing balance support: Bilateral upper extremity supported Standing balance-Leahy Scale: Fair                              Cognition Arousal/Alertness: Awake/alert Behavior During Therapy: WFL for tasks assessed/performed Overall Cognitive Status: Within Functional Limits for tasks assessed                                 General Comments: complaints of bowel urgency        Exercises Other Exercises Other Exercises: ambulated to bathroom with mod I. Safe technique    General Comments        Pertinent Vitals/Pain Pain Assessment Pain Assessment: No/denies pain    Home Living                          Prior Function            PT Goals (current goals can now be found in the care plan section) Acute Rehab PT Goals Patient Stated Goal: to go home PT Goal Formulation: With patient Time For Goal Achievement: 12/27/21 Potential to Achieve Goals: Good  Progress towards PT goals: Progressing toward goals    Frequency    Min 2X/week      PT Plan Current plan remains appropriate    Co-evaluation              AM-PAC PT "6 Clicks" Mobility   Outcome Measure  Help needed turning from your back to your side while in a flat bed without using bedrails?: None Help needed moving from lying on your back to sitting on the side of a flat bed without using bedrails?: None Help needed moving to and from a bed to a chair (including a wheelchair)?: None Help needed standing up from a chair using your arms (e.g., wheelchair or bedside chair)?: None Help needed to walk in hospital room?: None Help needed climbing 3-5 steps with a  railing? : A Little 6 Click Score: 23    End of Session Equipment Utilized During Treatment: Oxygen Activity Tolerance: Patient tolerated treatment well Patient left: in bed;with family/visitor present Nurse Communication: Mobility status PT Visit Diagnosis: Pain Pain - Right/Left:  (abdominal) Pain - part of body:  (abdominal)     Time: 1417-1430 PT Time Calculation (min) (ACUTE ONLY): 13 min  Charges:  $Gait Training: 8-22 mins                     Greggory Stallion, PT, DPT, GCS [phone]    Cary Lothrop 12/14/2021, 3:22 PM

## 2021-12-14 NOTE — Progress Notes (Signed)
Notified Dr. Peyton Najjar of BP 88/60. Per MD this is patient's normal. No new orders at this time.

## 2021-12-15 NOTE — Progress Notes (Signed)
Patient ID: Brianna Briggs, female   DOB: Dec 19, 1950, 71 y.o.   MRN: [id number]     [place] Hospital Day(s): 3.   Interval History: Patient seen and examined, no acute events or new complaints overnight. Patient reports she is passing gas but no bowel movement today.  Continue having soreness on the right side of the abdomen.  No nausea or vomiting.  No fever.  Vital signs in last 24 hours: [min-max] current  Temp:  [97.9 F (36.6 C)-99 F (37.2 C)] 98.6 F (37 C) (11/30 4098) Pulse Rate:  [73-94] 73 (11/30 0822) Resp:  [16-20] 16 (11/30 0822) BP: (84-93)/(50-60) 93/54 (11/30 0822) SpO2:  [91 %-100 %] 100 % (11/30 0822)     Height: '5\' 2"'$  (157.5 cm) Weight: 54 kg BMI (Calculated): 21.77   Physical Exam:  Constitutional: alert, cooperative and no distress  Respiratory: breathing non-labored at rest  Cardiovascular: regular rate and sinus rhythm  Gastrointestinal: soft, non-tender, and non-distended.  The wounds are and clean  Labs:     Latest Ref Rng & Units 12/14/2021    4:46 AM 12/13/2021    5:44 AM 08/31/2021    6:23 AM  CBC  WBC 4.0 - 10.5 K/uL 7.3  10.9  7.0   Hemoglobin 12.0 - 15.0 g/dL 10.2  11.8  9.6   Hematocrit 36.0 - 46.0 % 31.3  36.4  30.0   Platelets 150 - 400 K/uL 133  177  162       Latest Ref Rng & Units 12/14/2021    4:46 AM 12/13/2021    5:44 AM 08/31/2021    6:23 AM  CMP  Glucose 70 - 99 mg/dL 89  109  84   BUN 8 - 23 mg/dL '21  22  13   '$ Creatinine 0.44 - 1.00 mg/dL 0.93  0.89  0.67   Sodium 135 - 145 mmol/L 143  137  140   Potassium 3.5 - 5.1 mmol/L 4.0  4.5  3.6   Chloride 98 - 111 mmol/L 118  111  103   CO2 22 - 32 mmol/L 21  21  32   Calcium 8.9 - 10.3 mg/dL 8.4  8.4  8.4     Imaging studies: No new pertinent imaging studies   Assessment/Plan:  71 y.o. female with ileostomy in place 3 Day Post-Op s/p ileostomy reversal.  Patient recovering slowly but adequately -Diarrhea improving -Still with some soreness -Will  continue pain management -Will assess for diet toleration now that she does not have diarrhea.  Hopefully will be able to discharge in the next 24 hours.  Arnold Long, MD

## 2021-12-15 NOTE — Progress Notes (Signed)
Patient ID: Brianna Briggs, female   DOB: 1950-03-26, 71 y.o.   MRN: [id number]     [place] Hospital Day(s): 3.   Interval History: Patient seen and examined, no acute events or new complaints overnight. Patient reports she is having a lot of diarrhea.  She has had about 6 bowel movement.  Denies any nausea or vomiting endorses soreness on the right abdomen.  Vital signs in last 24 hours: [min-max] current  Temp:  [97.9 F (36.6 C)-99 F (37.2 C)] 98.6 F (37 C) (11/30 5681) Pulse Rate:  [73-94] 73 (11/30 0822) Resp:  [16-20] 16 (11/30 0822) BP: (84-93)/(50-60) 93/54 (11/30 0822) SpO2:  [91 %-100 %] 100 % (11/30 0822)     Height: '5\' 2"'$  (157.5 cm) Weight: 54 kg BMI (Calculated): 21.77   Physical Exam:  Constitutional: alert, cooperative and no distress  Respiratory: breathing non-labored at rest  Cardiovascular: regular rate and sinus rhythm  Gastrointestinal: soft, non-tender, and non-distended.  The wound is [employer]:     Latest Ref Rng & Units 12/14/2021    4:46 AM 12/13/2021    5:44 AM 08/31/2021    6:23 AM  CBC  WBC 4.0 - 10.5 K/uL 7.3  10.9  7.0   Hemoglobin 12.0 - 15.0 g/dL 10.2  11.8  9.6   Hematocrit 36.0 - 46.0 % 31.3  36.4  30.0   Platelets 150 - 400 K/uL 133  177  162       Latest Ref Rng & Units 12/14/2021    4:46 AM 12/13/2021    5:44 AM 08/31/2021    6:23 AM  CMP  Glucose 70 - 99 mg/dL 89  109  84   BUN 8 - 23 mg/dL '21  22  13   '$ Creatinine 0.44 - 1.00 mg/dL 0.93  0.89  0.67   Sodium 135 - 145 mmol/L 143  137  140   Potassium 3.5 - 5.1 mmol/L 4.0  4.5  3.6   Chloride 98 - 111 mmol/L 118  111  103   CO2 22 - 32 mmol/L 21  21  32   Calcium 8.9 - 10.3 mg/dL 8.4  8.4  8.4     Imaging studies: No new pertinent imaging studies   Assessment/Plan:  71 y.o. female with ileostomy in place 2 Day Post-Op s/p ileostomy reversal.   -Recovering adequately -Today having postoperative diarrhea.  This is suspected after not using the large  intestine for the last 3 to 4 months -Will see how she does with solid diet before starting any medication for diarrhea -There has been no fever and no significant abdominal pain.  Will continue with pain management for the soreness. -Encouraged the patient to ambulate.  Arnold Long, MD

## 2021-12-15 NOTE — TOC Progression Note (Signed)
Transition of Care [facility]) - Progression Note    Patient Details  Name: Brianna Briggs MRN: [id number] Date of Birth: [birth year]  [facility] CM/SW [employer], LCSW Phone Number: 12/15/2021, 3:03 PM  Clinical Narrative:  Left message for [facility] liaison to let him know that patient will discharge tomorrow, per MD note. Sent secure chat to MD yesterday to request home health orders for PT, RN when discharged.   Expected Discharge Plan and Services                                                 Social Determinants of Health (SDOH) Interventions    Readmission Risk Interventions    07/28/2021    4:14 PM  Readmission Risk Prevention Plan  Transportation Screening Complete  PCP or Specialist Appt within 5-7 Days Complete  Home Care Screening Complete  Medication Review (RN CM) Complete

## 2021-12-15 NOTE — Care Management Important Message (Signed)
Important Message  Patient Details  Name: Brianna Briggs MRN: [id number] Date of Birth: August 05, 1950   Medicare Important Message Given:  Yes     [name] 12/15/2021, 2:29 PM

## 2021-12-15 NOTE — Progress Notes (Signed)
Mobility Specialist - Progress Note   12/15/21 1050  Mobility  Activity Ambulated independently in hallway  Level of Assistance Modified independent, requires aide device or extra time  Assistive Device None  Distance Ambulated (ft) 260 ft  Activity Response Tolerated well  Mobility Referral Yes  $Mobility charge 1 Mobility   Pre-mobility (RA): SpO2 97% During mobility(RA): SpO2 86-90% Post-mobility(RA): SPO2 96%  Pt supine upon entry, utilizing 2L [place]. Pt completed bed mob indep to the EOB. MS removed Pt from [place], O2 97% on RA. Pt ambulated two laps around NS ModI, tolerated well. Approximately 130 ft into ambulation Pt O2 desat to 86%, once seated for 1~2 minutes O2 rose to 90%. Pt denied feeling SOB or fatigued at any point during amb. Pt returned to room, left supine on 2L [place] with O2 >95%. Bed alarm set and needs within reach.   Candie Mile Mobility Specialist 12/15/21 10:59 AM

## 2021-12-16 MED ORDER — HYDROCODONE-ACETAMINOPHEN 5-325 MG PO TABS: 1.0000 | ORAL_TABLET | ORAL | 0 refills | Status: DC | PRN

## 2021-12-16 MED ORDER — METHOCARBAMOL 500 MG PO TABS: 500.0000 mg | ORAL_TABLET | Freq: Four times a day (QID) | ORAL | 0 refills | Status: AC

## 2021-12-16 NOTE — Progress Notes (Signed)
Mobility Specialist - Progress Note   12/16/21 1053  Mobility  Activity Ambulated independently in room  Level of Assistance Modified independent, requires aide device or extra time  Assistive Device None  Distance Ambulated (ft) 20 ft  Activity Response Tolerated well  Mobility Referral Yes  $Mobility charge 1 Mobility    Pt ambulating in room upon entry. Pt indep ambulating, picking up clothing and objects around the room awaiting D/C. Pt returned to bed, left sitting EOB with needs within reach.   Candie Mile Mobility Specialist 12/16/21 10:57 AM

## 2021-12-16 NOTE — Discharge Summary (Signed)
  Patient ID: Brianna Briggs MRN: [id number] DOB/AGE: [birth year] 71 y.o.  Admit date: 12/12/2021 Discharge date: 12/16/2021   Discharge Diagnoses:  Principal Problem:   Ileostomy status ([place])   Procedures: Ileostomy reversal and incisional hernia repair  Hospital Course: Patient with ileostomy status.  She underwent ileostomy reversal.  She had incisional hernia and it was repaired with mesh.  She has been recovering adequately.  Patient tolerating diet.  Patient having bowel movement.  Pain is under control with current pain medication.  Patient ambulating.  Physical Exam Vitals reviewed.  HENT:     Head: Normocephalic.  Cardiovascular:     Rate and Rhythm: Normal rate and regular rhythm.  Pulmonary:     Effort: Pulmonary effort is normal.     Breath sounds: Normal breath sounds.  Abdominal:     General: Abdomen is flat.     Comments: Right lower quadrant wound partially open and is dry and clean.   Musculoskeletal:     Cervical back: Normal range of motion.  Neurological:     Mental Status: She is alert and oriented to person, place, and time.      Consults: Physical therapy  Disposition: Discharge disposition: 01-Home or Self Care       Discharge Instructions     Diet - low sodium heart healthy   Complete by: As directed    Increase activity slowly   Complete by: As directed       Allergies as of 12/16/2021       Reactions   Amoxicillin-pot Clavulanate Diarrhea, Rash   TOLERATED CEFAZOLIN Other reaction(s): Unknown   Biaxin [clarithromycin] Diarrhea   Other reaction(s): Unknown   Oxycodone Itching   Codeine Rash   Nitrofurantoin Rash        Medication List     TAKE these medications    Cholecalciferol 25 MCG (1000 UT) capsule Take 2,000 Units by mouth daily.   guaiFENesin-dextromethorphan 100-10 MG/5ML syrup Commonly known as: ROBITUSSIN DM Take 10 mLs by mouth every 4 (four) hours as needed for cough.   HYDROcodone-acetaminophen  5-325 MG tablet Commonly known as: NORCO/VICODIN Take 1 tablet by mouth every 4 (four) hours as needed for moderate pain.   methocarbamol 500 MG tablet Commonly known as: ROBAXIN Take 1 tablet (500 mg total) by mouth 4 (four) times daily for 5 days.   mometasone 0.1 % cream Commonly known as: ELOCON Apply 1 Application topically as needed.   multivitamin tablet Take 2 tablets by mouth daily.   OXYGEN Inhale 2 L into the lungs at bedtime.   Ventolin HFA 108 (90 Base) MCG/ACT inhaler Generic drug: albuterol Inhale 2 puffs into the lungs every evening.        Follow-up Information     [facility] Follow up.   Why: They will resume home health services at discharge.        Herbert Pun, MD Follow up in 2 week(s).   Specialty: General Surgery Contact information: [address] [place] [address] [phone]

## 2021-12-16 NOTE — Discharge Instructions (Signed)

## 2021-12-16 NOTE — Plan of Care (Signed)
  Problem: Health Behavior/Discharge Planning: Goal: Ability to manage health-related needs will improve Outcome: Progressing   Problem: Education: Goal: Knowledge of General Education information will improve Description: Including pain rating scale, medication(s)/side effects and non-pharmacologic comfort measures Outcome: Progressing   Problem: Clinical Measurements: Goal: Ability to maintain clinical measurements within normal limits will improve Outcome: Progressing   

## 2022-01-30 ENCOUNTER — Ambulatory Visit (INDEPENDENT_AMBULATORY_CARE_PROVIDER_SITE_OTHER): Payer: Medicare Other | Admitting: Dermatology

## 2022-01-30 VITALS — BP 118/69 | HR 78

## 2022-01-30 DIAGNOSIS — L309 Dermatitis, unspecified: Secondary | ICD-10-CM | POA: Diagnosis not present

## 2022-01-30 DIAGNOSIS — Z79899 Other long term (current) drug therapy: Secondary | ICD-10-CM

## 2022-01-30 DIAGNOSIS — L720 Epidermal cyst: Secondary | ICD-10-CM | POA: Diagnosis not present

## 2022-01-30 DIAGNOSIS — L650 Telogen effluvium: Secondary | ICD-10-CM

## 2022-01-30 DIAGNOSIS — Z7189 Other specified counseling: Secondary | ICD-10-CM

## 2022-01-30 MED ORDER — MINOXIDIL 2.5 MG PO TABS: ORAL_TABLET | ORAL | 2 refills | Status: DC

## 2022-01-30 MED ORDER — MOMETASONE FUROATE 0.1 % EX CREA: TOPICAL_CREAM | CUTANEOUS | 2 refills | Status: DC

## 2022-01-30 NOTE — Patient Instructions (Addendum)
Telogen Effluvium Counseling Telogen effluvium is a benign, self-limited condition causing increased hair shedding usually for several months. It does not progress to baldness, and the hair eventually grows back on its own. It can be triggered by recent illness, recent surgery, thyroid disease, low iron stores, vitamin D deficiency, fad diets or rapid weight loss, hormonal changes such as pregnancy or birth control pills, and some medication. Usually the hair loss starts 2-3 months after the illness or health change. Rarely, it can continue for longer than a year. Treatments options may include oral or topical Minoxidil; Red Light scalp treatments; Biotin 2.5 mg daily and other options. Common to have this condition after surgeries, illnesses, hospitalizations.   Start Biotin 2.5 mg daily. Continue Vit. D3.  Start Minoxidil 2.'5mg'$  1/2 tablet once daily     Eczema: Mometasone use once daily up to 5 days per week.  Topical steroids (such as triamcinolone, fluocinolone, fluocinonide, mometasone, clobetasol, halobetasol, betamethasone, hydrocortisone) can cause thinning and lightening of the skin if they are used for too long in the same area. Your physician has selected the right strength medicine for your problem and area affected on the body. Please use your medication only as directed by your physician to prevent side effects.     Gentle Skin Care Guide  1. Bathe no more than once a day.  2. Avoid bathing in hot water  3. Use a mild soap like Dove, Vanicream, Cetaphil, CeraVe. Can use Lever 2000 or Cetaphil antibacterial soap  4. Use soap only where you need it. On most days, use it under your arms, between your legs, and on your feet. Let the water rinse other areas unless visibly dirty.  5. When you get out of the bath/shower, use a towel to gently blot your skin dry, don't rub it.  6. While your skin is still a little damp, apply a moisturizing cream such as Vanicream, CeraVe,  Cetaphil, Eucerin, Sarna lotion or plain Vaseline Jelly. For hands apply Neutrogena [place] Hand Cream or Excipial Hand Cream.  7. Reapply moisturizer any time you start to itch or feel dry.  8. Sometimes using free and clear laundry detergents can be helpful. Fabric softener sheets should be avoided. Downy Free & Gentle liquid, or any liquid fabric softener that is free of dyes and perfumes, it acceptable to use  9. If your doctor has given you prescription creams you may apply moisturizers over them      Due to recent changes in healthcare laws, you may see results of your pathology and/or laboratory studies on MyChart before the doctors have had a chance to review them. We understand that in some cases there may be results that are confusing or concerning to you. Please understand that not all results are received at the same time and often the doctors may need to interpret multiple results in order to provide you with the best plan of care or course of treatment. Therefore, we ask that you please give [place] 2 business days to thoroughly review all your results before contacting the office for clarification. Should we see a critical lab result, you will be contacted sooner.   If You Need Anything After Your Visit  If you have any questions or concerns for your doctor, please call our main line at [phone] and press option 4 to reach your doctor's medical assistant. If no one answers, please leave a voicemail as directed and we will return your call as soon as possible. Messages left after  4 pm will be answered the following business day.   You may also send [place] a message via [place]. We typically respond to MyChart messages within 1-2 business days.  For prescription refills, please ask your pharmacy to contact our office. Our fax number is [phone].  If you have an urgent issue when the clinic is closed that cannot wait until the next business day, you can page your doctor at the  number below.    Please note that while we do our best to be available for urgent issues outside of office hours, we are not available 24/7.   If you have an urgent issue and are unable to reach [place], you may choose to seek medical care at your doctor's office, retail clinic, urgent care center, or emergency room.  If you have a medical emergency, please immediately call 911 or go to the emergency department.  Pager Numbers  - Dr. Nehemiah Massed: [phone]  - Dr. Laurence Ferrari: [phone]  - Dr. Nicole Kindred: [phone]  In the event of inclement weather, please call our main line at [phone] for an update on the status of any delays or closures.  Dermatology Medication Tips: Please keep the boxes that topical medications come in in order to help keep track of the instructions about where and how to use these. Pharmacies typically print the medication instructions only on the boxes and not directly on the medication tubes.   If your medication is too expensive, please contact our office at [phone] option 4 or send [place] a message through [place].   We are unable to tell what your co-pay for medications will be in advance as this is different depending on your insurance coverage. However, we may be able to find a substitute medication at lower cost or fill out paperwork to get insurance to cover a needed medication.   If a prior authorization is required to get your medication covered by your insurance company, please allow [place] 1-2 business days to complete this process.  Drug prices often vary depending on where the prescription is filled and some pharmacies may offer cheaper prices.  The website www.goodrx.com contains coupons for medications through different pharmacies. The prices here do not account for what the cost may be with help from insurance (it may be cheaper with your insurance), but the website can give you the price if you did not use any insurance.  - You can print the associated  coupon and take it with your prescription to the pharmacy.  - You may also stop by our office during regular business hours and pick up a GoodRx coupon card.  - If you need your prescription sent electronically to a different pharmacy, notify our office through [facility] or by phone at [phone] option 4.     Si Usted Necesita Algo Despus de Su Visita  Tambin puede enviarnos un mensaje a travs de Pharmacist, community. Por lo general respondemos a los mensajes de MyChart en el transcurso de 1 a 2 das hbiles.  Para renovar recetas, por favor pida a su farmacia que se ponga en contacto con nuestra oficina. Harland Dingwall de fax es [place] [phone].  Si tiene un asunto urgente cuando la clnica est cerrada y que no puede esperar hasta el siguiente da hbil, puede llamar/localizar a su doctor(a) al nmero que aparece a continuacin.   Por favor, tenga en cuenta que aunque hacemos todo lo posible para estar disponibles para asuntos urgentes fuera del horario de oficina, no estamos disponibles las  [place], los 7 das de la [place].   Si tiene un problema urgente y no puede comunicarse con nosotros, puede optar por buscar atencin mdica  en el consultorio de su doctor(a), en una clnica privada, en un centro de atencin urgente o en una sala de emergencias.  Si tiene Engineering geologist, por favor llame inmediatamente al 911 o vaya a la sala de emergencias.  Nmeros de bper  - Dr. Nehemiah Massed: [phone]  - Dra. Moye: [phone]  - Dra. Nicole Kindred: [phone]  En caso de inclemencias del [place], por favor llame a Johnsie Kindred principal al [phone] para una actualizacin sobre el [place] de cualquier retraso o cierre.  Consejos para la medicacin en dermatologa: Por favor, guarde las cajas en las que vienen los medicamentos de uso tpico para ayudarle a seguir las instrucciones sobre dnde y cmo usarlos. Las farmacias generalmente imprimen las instrucciones del  medicamento slo en las cajas y no directamente en los tubos del [place].   Si su medicamento es muy caro, por favor, pngase en contacto con Zigmund Daniel llamando al [phone] y presione la opcin 4 o envenos un mensaje a travs de Pharmacist, community.   No podemos decirle cul ser su copago por los medicamentos por adelantado ya que esto es diferente dependiendo de la cobertura de su seguro. Sin embargo, es posible que podamos encontrar un medicamento sustituto a Electrical engineer un formulario para que el seguro cubra el medicamento que se considera necesario.   Si se requiere una autorizacin previa para que su compaa de seguros [place] su medicamento, por favor permtanos de 1 a 2 das hbiles para completar este proceso.  Los precios de los medicamentos varan con frecuencia dependiendo del Environmental consultant de dnde se surte la receta y alguna farmacias pueden ofrecer precios ms baratos.  El sitio web www.goodrx.com tiene cupones para medicamentos de Airline pilot. Los precios aqu no tienen en cuenta lo que podra costar con la ayuda del seguro (puede ser ms barato con su seguro), pero el sitio web puede darle el precio si no utiliz Research scientist (physical sciences).  - Puede imprimir el cupn correspondiente y llevarlo con su receta a la farmacia.  - Tambin puede pasar por nuestra oficina durante el horario de atencin regular y Charity fundraiser una tarjeta de cupones de GoodRx.  - Si necesita que su receta se enve electrnicamente a una farmacia diferente, informe a nuestra oficina a travs de MyChart de [place] o por telfono llamando al [phone] y presione la opcin 4.

## 2022-01-30 NOTE — Progress Notes (Signed)
Follow-Up Visit   Subjective  Brianna Briggs is a 72 y.o. female who presents for the following: Pruritis (Scalp. C/O itching and hair loss. Dur: ~4 months. Was hospitalized from May to June then July 16th for surgery to remove part of colon due to diverticulitis, Covid 2 days after surgery then had severe sepsis. Discharged from hospital 09/01/21. Surgery 12/12/21, hospitalized for 5 days) and Eczema (Face, hands, legs, arms. Uses Mometasone daily 5 days per week. Would like to discuss topical hyaluronic acid). Also with spot on back.  The patient has spots, moles and lesions to be evaluated, some may be new or changing and the patient has concerns that these could be cancer.  The following portions of the chart were reviewed this encounter and updated as appropriate:  Tobacco  Allergies  Meds  Problems  Med Hx  Surg Hx  Fam Hx     Review of Systems: No other skin or systemic complaints except as noted in HPI or Assessment and Plan.  Objective  Well appearing patient in no apparent distress; mood and affect are within normal limits.  A focused examination was performed including head, including the scalp, face, neck, nose, ears, eyelids, and lips. Relevant physical exam findings are noted in the Assessment and Plan.  Scalp Diffuse thinning of hair, positive hair pull test.          Right Hand - Anterior Scaly erythematous papules and patches +/- dyspigmentation, lichenification, excoriations.   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 It does not progress to baldness, and the hair eventually grows back on its own. It can be triggered by recent illness, recent surgery, thyroid disease, low iron stores, vitamin D deficiency, fad diets or rapid weight loss, hormonal changes such as pregnancy  or birth control pills, and some medication. Usually the hair loss starts 2-3 months after the illness or health change. Rarely, it can continue for longer than a year. Treatments options may include oral or topical Minoxidil; Red Light scalp treatments; Biotin 2.5 mg daily and other options.  Common to have this condition after surgeries, illnesses, hospitalizations.   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 Condition is bothersome/symptomatic for patient. Currently flared. Mometasone use once daily up to 5 days per week.  Atopic dermatitis (eczema) is a chronic, relapsing, pruritic condition that can significantly affect quality of life. It is often associated with allergic rhinitis and/or asthma and can require treatment with topical medications, phototherapy, or in severe cases biologic injectable medication (Dupixent; Adbry) or Oral JAK inhibitors.  Topical steroids (such as triamcinolone, fluocinolone, fluocinonide, mometasone, clobetasol, halobetasol, betamethasone, hydrocortisone) can cause thinning and lightening of the skin if they are used for too long in the same area. Your physician has selected the right strength medicine for your problem and area affected on the body. Please use your medication only as directed by your physician to prevent side effects.   Related Medications mometasone (ELOCON) 0.1 % cream Apply once daily up to 5 days a week as needed  Epidermal inclusion cyst back Benign-appearing. Exam most consistent with an epidermal inclusion cyst. Discussed that a cyst is a benign growth that can  grow over time and sometimes get irritated or inflamed. Recommend observation if it is not bothersome. Discussed option of surgical excision to remove it if it is growing, symptomatic, or other  changes noted. Please call for new or changing lesions so they can be evaluated.  Return in about 3 months (around 05/01/2022) for Telogen follow up.  I, Emelia Salisbury, CMA, am acting as scribe for Sarina Ser, MD. Documentation: I have reviewed the above documentation for accuracy and completeness, and I agree with the above.  Sarina Ser, MD

## 2022-02-01 ENCOUNTER — Encounter: Payer: Self-pay | Admitting: Dermatology

## 2022-03-22 ENCOUNTER — Ambulatory Visit
Admission: RE | Admit: 2022-03-22 | Discharge: 2022-03-22 | Disposition: A | Discharge status: Home / Self Care | Payer: Medicare Other | Source: Ambulatory Visit | Attending: Internal Medicine | Admitting: Internal Medicine

## 2022-03-22 DIAGNOSIS — Z87891 Personal history of nicotine dependence: Secondary | ICD-10-CM | POA: Diagnosis present

## 2022-03-24 ENCOUNTER — Other Ambulatory Visit: Payer: Self-pay | Admitting: Acute Care

## 2022-03-24 DIAGNOSIS — Z122 Encounter for screening for malignant neoplasm of respiratory organs: Secondary | ICD-10-CM

## 2022-03-24 DIAGNOSIS — Z87891 Personal history of nicotine dependence: Secondary | ICD-10-CM

## 2022-04-24 ENCOUNTER — Ambulatory Visit (INDEPENDENT_AMBULATORY_CARE_PROVIDER_SITE_OTHER): Payer: Medicare Other | Admitting: Dermatology

## 2022-04-24 VITALS — BP 126/77 | Wt 137.0 lb

## 2022-04-24 DIAGNOSIS — L219 Seborrheic dermatitis, unspecified: Secondary | ICD-10-CM

## 2022-04-24 DIAGNOSIS — L299 Pruritus, unspecified: Secondary | ICD-10-CM

## 2022-04-24 DIAGNOSIS — L820 Inflamed seborrheic keratosis: Secondary | ICD-10-CM | POA: Diagnosis not present

## 2022-04-24 DIAGNOSIS — Z7189 Other specified counseling: Secondary | ICD-10-CM

## 2022-04-24 DIAGNOSIS — L2089 Other atopic dermatitis: Secondary | ICD-10-CM | POA: Diagnosis not present

## 2022-04-24 DIAGNOSIS — Z79899 Other long term (current) drug therapy: Secondary | ICD-10-CM | POA: Diagnosis not present

## 2022-04-24 DIAGNOSIS — L650 Telogen effluvium: Secondary | ICD-10-CM | POA: Diagnosis not present

## 2022-04-24 MED ORDER — TACROLIMUS 0.1 % EX OINT: TOPICAL_OINTMENT | CUTANEOUS | 3 refills | Status: DC

## 2022-04-24 MED ORDER — KETOCONAZOLE 2 % EX SHAM: 1.0000 | MEDICATED_SHAMPOO | CUTANEOUS | 4 refills | Status: DC

## 2022-04-24 NOTE — Patient Instructions (Signed)
Due to recent changes in healthcare laws, you may see results of your pathology and/or laboratory studies on MyChart before the doctors have had a chance to review them. We understand that in some cases there may be results that are confusing or concerning to you. Please understand that not all results are received at the same time and often the doctors may need to interpret multiple results in order to provide you with the best plan of care or course of treatment. Therefore, we ask that you please give us 2 business days to thoroughly review all your results before contacting the office for clarification. Should we see a critical lab result, you will be contacted sooner.   If You Need Anything After Your Visit  If you have any questions or concerns for your doctor, please call our main line at [phone] and press option 4 to reach your doctor's medical assistant. If no one answers, please leave a voicemail as directed and we will return your call as soon as possible. Messages left after 4 pm will be answered the following business day.   You may also send us a message via MyChart. We typically respond to MyChart messages within 1-2 business days.  For prescription refills, please ask your pharmacy to contact our office. Our fax number is [phone].  If you have an urgent issue when the clinic is closed that cannot wait until the next business day, you can page your doctor at the number below.    Please note that while we do our best to be available for urgent issues outside of office hours, we are not available 24/7.   If you have an urgent issue and are unable to reach us, you may choose to seek medical care at your doctor's office, retail clinic, urgent care center, or emergency room.  If you have a medical emergency, please immediately call 911 or go to the emergency department.  Pager Numbers  - Dr. Kowalski: [phone]  - Dr. Moye: [phone]  - Dr. Stewart:  [phone]  In the event of inclement weather, please call our main line at [phone] for an update on the status of any delays or closures.  Dermatology Medication Tips: Please keep the boxes that topical medications come in in order to help keep track of the instructions about where and how to use these. Pharmacies typically print the medication instructions only on the boxes and not directly on the medication tubes.   If your medication is too expensive, please contact our office at [phone] option 4 or send us a message through MyChart.   We are unable to tell what your co-pay for medications will be in advance as this is different depending on your insurance coverage. However, we may be able to find a substitute medication at lower cost or fill out paperwork to get insurance to cover a needed medication.   If a prior authorization is required to get your medication covered by your insurance company, please allow us 1-2 business days to complete this process.  Drug prices often vary depending on where the prescription is filled and some pharmacies may offer cheaper prices.  The website www.goodrx.com contains coupons for medications through different pharmacies. The prices here do not account for what the cost may be with help from insurance (it may be cheaper with your insurance), but the website can give you the price if you did not use any insurance.  - You can print the associated coupon and take it with   your prescription to the pharmacy.  - You may also stop by our office during regular business hours and pick up a GoodRx coupon card.  - If you need your prescription sent electronically to a different pharmacy, notify our office through [place] MyChart or by phone at [phone] option 4.     Si Usted Necesita Algo Despus de Su Visita  Tambin puede enviarnos un mensaje a travs de MyChart. Por lo general respondemos a los mensajes de MyChart en el transcurso de 1 a 2  das hbiles.  Para renovar recetas, por favor pida a su farmacia que se ponga en contacto con nuestra oficina. Nuestro nmero de fax es el [phone].  Si tiene un asunto urgente cuando la clnica est cerrada y que no puede esperar hasta el siguiente da hbil, puede llamar/localizar a su doctor(a) al nmero que aparece a continuacin.   Por favor, tenga en cuenta que aunque hacemos todo lo posible para estar disponibles para asuntos urgentes fuera del horario de oficina, no estamos disponibles las 24 horas del da, los 7 das de la semana.   Si tiene un problema urgente y no puede comunicarse con nosotros, puede optar por buscar atencin mdica  en el consultorio de su doctor(a), en una clnica privada, en un centro de atencin urgente o en una sala de emergencias.  Si tiene una emergencia mdica, por favor llame inmediatamente al 911 o vaya a la sala de emergencias.  Nmeros de bper  - Dr. Kowalski: [phone]  - Dra. Moye: [phone]  - Dra. Stewart: [phone]  En caso de inclemencias del tiempo, por favor llame a nuestra lnea principal al [phone] para una actualizacin sobre el estado de cualquier retraso o cierre.  Consejos para la medicacin en dermatologa: Por favor, guarde las cajas en las que vienen los medicamentos de uso tpico para ayudarle a seguir las instrucciones sobre dnde y cmo usarlos. Las farmacias generalmente imprimen las instrucciones del medicamento slo en las cajas y no directamente en los tubos del medicamento.   Si su medicamento es muy caro, por favor, pngase en contacto con nuestra oficina llamando al [phone] y presione la opcin 4 o envenos un mensaje a travs de MyChart.   No podemos decirle cul ser su copago por los medicamentos por adelantado ya que esto es diferente dependiendo de la cobertura de su seguro. Sin embargo, es posible que podamos encontrar un medicamento sustituto a menor costo o llenar un formulario para que el  seguro cubra el medicamento que se considera necesario.   Si se requiere una autorizacin previa para que su compaa de seguros cubra su medicamento, por favor permtanos de 1 a 2 das hbiles para completar este proceso.  Los precios de los medicamentos varan con frecuencia dependiendo del lugar de dnde se surte la receta y alguna farmacias pueden ofrecer precios ms baratos.  El sitio web www.goodrx.com tiene cupones para medicamentos de diferentes farmacias. Los precios aqu no tienen en cuenta lo que podra costar con la ayuda del seguro (puede ser ms barato con su seguro), pero el sitio web puede darle el precio si no utiliz ningn seguro.  - Puede imprimir el cupn correspondiente y llevarlo con su receta a la farmacia.  - Tambin puede pasar por nuestra oficina durante el horario de atencin regular y recoger una tarjeta de cupones de GoodRx.  - Si necesita que su receta se enve electrnicamente a una farmacia diferente, informe a nuestra oficina a travs de MyChart de [place]   o por telfono llamando al [phone] y presione la opcin 4.  

## 2022-04-24 NOTE — Progress Notes (Signed)
Follow-Up Visit   Subjective  Brianna Briggs is a 72 y.o. female who presents for the following: Telogen Effluvium 69m f/u, pt took Minoxidil 2.5mg  1/2 po ~2-3 days bc it made her feel different, taking Biotin qd, scalp itchy,   The following portions of the chart were reviewed this encounter and updated as appropriate: medications, allergies, medical history  Review of Systems:  No other skin or systemic complaints except as noted in HPI or Assessment and Plan.  Objective  Well appearing patient in no apparent distress; mood and affect are within normal limits. A focused examination was performed of the following areas: scalp Relevant exam findings are noted in the Assessment and Plan.   Assessment & Plan   TELOGEN EFFLUVIUM Exam: Diffuse thinning of hair, positive hair pull test.  Telogen effluvium is a benign, self-limited condition causing increased hair shedding usually for several months. It does not progress to baldness, and the hair eventually grows back on its own. It can be triggered by recent illness, recent surgery, thyroid disease, low iron stores, vitamin D deficiency, fad diets or rapid weight loss, hormonal changes such as pregnancy or birth control pills, and some medication. Usually the hair loss starts 2-3 months after the illness or health change. Rarely, it can continue for longer than a year. Treatments options may include oral or topical Minoxidil; Red Light scalp treatments; Biotin 2.5 mg daily and other options.  Treatment Plan: Cont Biotin 2.5mg  qd  SEBORRHEIC DERMATITIS with PRURITUS scalp Exam: mild scale scalp  Seborrheic Dermatitis is a chronic persistent rash characterized by pinkness and scaling most commonly of the mid face but also can occur on the scalp (dandruff), ears; mid chest, mid back and groin.  It tends to be exacerbated by stress and cooler weather.  People who have neurologic disease may experience new onset or exacerbation of  existing seborrheic dermatitis.  The condition is not curable but treatable and can be controlled.  Treatment Plan: Start Ketoconazole 2% shampoo 3x/wk, let sit 5 minutes and rinse out  INFLAMED SEBORRHEIC KERATOSIS Exam: Erythematous keratotic or waxy stuck-on papule or plaque. Symptomatic, irritating, patient would like treated. Benign-appearing.  Call clinic for new or changing lesions.   Prior to procedure, discussed risks of blister formation, small wound, skin dyspigmentation, or rare scar following treatment. Recommend Vaseline ointment to treated areas while healing.  Discussed may take more then 1 treatment  Destruction Procedure Note Destruction method: cryotherapy   Informed consent: discussed and consent obtained   Lesion destroyed using liquid nitrogen: Yes   Outcome: patient tolerated procedure well with no complications   Post-procedure details: wound care instructions given   Locations: R scalp x 3, R medial knee x 4 # of Lesions Treated: 7   HAND DERMATITIS Atopic dermatitis Exam Fissures and peeling of the hands Treatment Plan Start Tacrolimus oint 0.1% qd/bid aa hands until clear Discussed injectables/oral medication, pt declines  Hand Dermatitis is a chronic type of eczema that can come and go on the hands and fingers.  While there is no cure, the rash and symptoms can be managed with topical prescription medications, and for more severe cases, with systemic medications.  Recommend mild soap and routine use of moisturizing cream after handwashing.  Minimize soap/water exposure when possible.     Return in about 3 months (around 07/24/2022) for Telogen Effluvium, Seb derm, hand derm.  I, Ardis Rowan, RMA, am acting as scribe for Armida Sans, MD . Documentation: I have reviewed the  above documentation for accuracy and completeness, and I agree with the above.  Armida Sans, MD

## 2022-05-02 ENCOUNTER — Encounter: Payer: Self-pay | Admitting: Dermatology

## 2022-05-04 ENCOUNTER — Ambulatory Visit: Payer: Medicare Other | Admitting: Dermatology

## 2022-05-19 ENCOUNTER — Encounter: Payer: Self-pay | Admitting: Gastroenterology

## 2022-05-21 NOTE — H&P (Signed)
Pre-Procedure H&P   Patient ID: Brianna Briggs is a 72 y.o. female.  Gastroenterology Provider: Jaynie Collins, DO  Referring Provider: Jacob Moores, PA PCP: Lauro Regulus, MD  Date: 05/22/2022  HPI Brianna Briggs is a 72 y.o. female who presents today for Esophagogastroduodenoscopy and Colonoscopy for Surveillance-Barrett's esophagus, gastrointestinal metaplasia; chronic diarrhea, colitis .  Patient last underwent EGD in April 2021 demonstrating gastric polyp with intestinal metaplasia and Barrett's esophagus without dysplasia. Reports she is not currently taking ppi.  Patient had significant diverticulitis requiring abscess IR drainage and eventual sigmoidectomy with ileostomy formation in July 2023.  She underwent ileostomy takedown in November 2023 with 3 anastomosis.  Previous colonoscopy in June 2009 demonstrated pancolitis.  In October 2013 colonoscopy was normal.  Most recent colonoscopy in April 2021 demonstrating diverticulosis, 3 tubular adenomatous polyps.  Biopsies throughout were representative chronic quiescent colitis.  Patient reports 1-2 bowel movements per day sometimes these have formed.  She has noted increased urgency since her reanastomosis.  No melena or hematochezia.  She has noticed some improvement with fiber.  Status post hysterectomy Creatinine 0.9 hemoglobin 10.2 MCV 90.7 platelets 233,000   Past Medical History:  Diagnosis Date   Acute on chronic respiratory failure with hypoxia (HCC)    Allergic genetic state    Breast cancer, right (HCC) 1996   a.) s/p total mastectomy   Bronchitis    Chronic eczema    COPD (chronic obstructive pulmonary disease) (HCC)    Coronary artery calcification seen on CT scan    Dependence on nocturnal oxygen therapy    a.) 2 L/[place]   Diastolic dysfunction    a.) TTE 02/28/2021: EF 50%, triv MR/TR, G1DD; b.) TEE 07/16/2021: EF 55-60%, G1DD.   Diverticulitis of large intestine with abscess  without bleeding    DOE (dyspnea on exertion)    History of 2019 novel coronavirus disease (COVID-19) 08/2021   History of chicken pox    History of colon polyps    Hyperlipidemia    Hypophosphatemia    Hypotension    MDD (major depressive disorder)    Migraines    Perforated diverticulum of intestine    Peripheral vascular disease with claudication (HCC)    PSVT (paroxysmal supraventricular tachycardia)    PVC (premature ventricular contraction)    Severe sepsis with lactic acidosis (HCC)    Single kidney    Squamous cell carcinoma of skin 01/27/2019   Right inferior lateral thigh. KA-type   Thoracic aortic atherosclerosis (HCC)    Tobacco use     Past Surgical History:  Procedure Laterality Date   ABDOMINAL HYSTERECTOMY     COLON SURGERY     COLONOSCOPY WITH PROPOFOL N/A 05/01/2019   Procedure: COLONOSCOPY WITH PROPOFOL;  Surgeon: Toledo, Boykin Nearing, MD;  Location: [facility];  Service: Gastroenterology;  Laterality: N/A;   ESOPHAGOGASTRODUODENOSCOPY (EGD) WITH PROPOFOL N/A 05/01/2019   Procedure: ESOPHAGOGASTRODUODENOSCOPY (EGD) WITH PROPOFOL;  Surgeon: Toledo, Boykin Nearing, MD;  Location: [facility];  Service: Gastroenterology;  Laterality: N/A;   HERNIA REPAIR     ILEOSTOMY CLOSURE N/A 12/12/2021   Procedure: ILEOSTOMY TAKEDOWN;  Surgeon: Carolan Shiver, MD;  Location: [facility];  Service: General;  Laterality: N/A;   IR CATHETER TUBE CHANGE  06/29/2021   IR RADIOLOGIST EVAL & MGMT  06/23/2021   IR RADIOLOGIST EVAL & MGMT  07/14/2021   LAPAROTOMY N/A 07/31/2021   Procedure: EXPLORATORY LAPAROTOMY;  Surgeon: Carolan Shiver, MD;  Location: [facility];  Service: General;  Laterality: N/A;   MASTECTOMY Right    PARASTOMAL HERNIA REPAIR N/A 12/12/2021   Procedure: HERNIA REPAIR PARASTOMAL;  Surgeon: Carolan Shiver, MD;  Location: [facility];  Service: General;  Laterality: N/A;   PARTIAL COLECTOMY N/A 07/31/2021   Procedure: Madelaine Bhat ILEOSTOMY;  Surgeon:  Carolan Shiver, MD;  Location: [facility];  Service: General;  Laterality: N/A;   SHOULDER ARTHROSCOPY WITH SUBACROMIAL DECOMPRESSION, ROTATOR CUFF REPAIR AND BICEP TENDON REPAIR Left 01/13/2008   TONSILLECTOMY     TUBAL LIGATION      Family History No h/o GI disease or malignancy  Review of Systems  Constitutional:  Negative for activity change, appetite change, chills, diaphoresis, fatigue, fever and unexpected weight change.  HENT:  Negative for trouble swallowing and voice change.   Respiratory:  Negative for shortness of breath and wheezing.   Cardiovascular:  Negative for chest pain, palpitations and leg swelling.  Gastrointestinal:  Positive for diarrhea. Negative for abdominal distention, abdominal pain, anal bleeding, blood in stool, constipation, nausea, rectal pain and vomiting.  Musculoskeletal:  Negative for arthralgias and myalgias.  Skin:  Negative for color change and pallor.  Neurological:  Negative for dizziness, syncope and weakness.  Psychiatric/Behavioral:  Negative for confusion.   All other systems reviewed and are negative.    Medications No current facility-administered medications on file prior to encounter.   Current Outpatient Medications on File Prior to Encounter  Medication Sig Dispense Refill   Cholecalciferol 25 MCG (1000 UT) capsule Take 2,000 Units by mouth daily.     Multiple Vitamin (MULTIVITAMIN) tablet Take 2 tablets by mouth daily.     OXYGEN Inhale 2 L into the lungs at bedtime.     VENTOLIN HFA 108 (90 Base) MCG/ACT inhaler Inhale 2 puffs into the lungs every evening.     guaiFENesin-dextromethorphan (ROBITUSSIN DM) 100-10 MG/5ML syrup Take 10 mLs by mouth every 4 (four) hours as needed for cough. 118 mL 0   HYDROcodone-acetaminophen (NORCO/VICODIN) 5-325 MG tablet Take 1 tablet by mouth every 4 (four) hours as needed for moderate pain. 20 tablet 0   minoxidil (LONITEN) 2.5 MG tablet Take 1/2 tablet once daily (Patient not taking:  Reported on 04/24/2022) 15 tablet 2   mometasone (ELOCON) 0.1 % cream Apply once daily up to 5 days a week as needed 45 g 2    Pertinent medications related to GI and procedure were reviewed by me with the patient prior to the procedure   Current Facility-Administered Medications:    0.9 %  sodium chloride infusion, , Intravenous, Continuous, Jaynie Collins, DO, Last Rate: 20 mL/hr at 05/22/22 0805, New Bag at 05/22/22 0805  sodium chloride 20 mL/hr at 05/22/22 0805       Allergies  Allergen Reactions   Amoxicillin-Pot Clavulanate Diarrhea and Rash    TOLERATED CEFAZOLIN Other reaction(s): Unknown   Biaxin [Clarithromycin] Diarrhea    Other reaction(s): Unknown   Oxycodone Itching   Codeine Rash   Nitrofurantoin Rash   Allergies were reviewed by me prior to the procedure  Objective   Body mass index is 24.95 kg/m. Vitals:   05/22/22 0737 05/22/22 0751  BP:  105/76  Pulse:  (!) 104  Resp:  20  Temp:  (!) 97.1 F (36.2 C)  TempSrc:  Temporal  SpO2:  93%  Weight: 61.9 kg      Physical Exam Vitals and nursing note reviewed.  Constitutional:      General: She is not in acute distress.    Appearance:  Normal appearance. She is not ill-appearing, toxic-appearing or diaphoretic.  HENT:     Head: Normocephalic and atraumatic.     Nose: Nose normal.     Mouth/Throat:     Mouth: Mucous membranes are moist.     Pharynx: Oropharynx is clear.  Eyes:     General: No scleral icterus.    Extraocular Movements: Extraocular movements intact.  Cardiovascular:     Rate and Rhythm: Regular rhythm. Tachycardia present.     Heart sounds: Normal heart sounds. No murmur heard.    No friction rub. No gallop.  Pulmonary:     Effort: Pulmonary effort is normal. No respiratory distress.     Breath sounds: Normal breath sounds. No wheezing, rhonchi or rales.  Abdominal:     General: Bowel sounds are normal. There is no distension.     Palpations: Abdomen is soft.      Tenderness: There is no abdominal tenderness. There is no guarding or rebound.  Musculoskeletal:     Cervical back: Neck supple.     Right lower leg: No edema.     Left lower leg: No edema.  Skin:    General: Skin is warm and dry.     Coloration: Skin is not jaundiced or pale.  Neurological:     General: No focal deficit present.     Mental Status: She is alert and oriented to person, place, and time. Mental status is at baseline.  Psychiatric:        Mood and Affect: Mood normal.        Behavior: Behavior normal.        Thought Content: Thought content normal.        Judgment: Judgment normal.      Assessment:  Brianna Briggs is a 72 y.o. female  who presents today for Esophagogastroduodenoscopy and Colonoscopy for Surveillance-Barrett's esophagus, gastrointestinal metaplasia; chronic diarrhea, colitis .  Plan:  Esophagogastroduodenoscopy and Colonoscopy with possible intervention today  Esophagogastroduodenoscopy and Colonoscopy with possible biopsy, control of bleeding, polypectomy, and interventions as necessary has been discussed with the patient/patient representative. Informed consent was obtained from the patient/patient representative after explaining the indication, nature, and risks of the procedure including but not limited to death, bleeding, perforation, missed neoplasm/lesions, cardiorespiratory compromise, and reaction to medications. Opportunity for questions was given and appropriate answers were provided. Patient/patient representative has verbalized understanding is amenable to undergoing the procedure.   Jaynie Collins, DO  [facility]  Portions of the record may have been created with voice recognition software. Occasional wrong-word or 'sound-a-like' substitutions may have occurred due to the inherent limitations of voice recognition software.  Read the chart carefully and recognize, using context, where substitutions may have  occurred.

## 2022-05-22 ENCOUNTER — Encounter
Admission: RE | Disposition: A | Discharge status: Home / Self Care | Payer: Self-pay | Source: Home / Self Care | Attending: Gastroenterology

## 2022-05-22 ENCOUNTER — Ambulatory Visit: Payer: Medicare Other | Admitting: Certified Registered Nurse Anesthetist

## 2022-05-22 ENCOUNTER — Ambulatory Visit
Admission: RE | Admit: 2022-05-22 | Discharge: 2022-05-22 | Disposition: A | Discharge status: Home / Self Care | Payer: Medicare Other | Attending: Gastroenterology | Admitting: Gastroenterology

## 2022-05-22 ENCOUNTER — Encounter: Payer: Self-pay | Admitting: Gastroenterology

## 2022-05-22 DIAGNOSIS — K635 Polyp of colon: Secondary | ICD-10-CM | POA: Diagnosis not present

## 2022-05-22 DIAGNOSIS — J449 Chronic obstructive pulmonary disease, unspecified: Secondary | ICD-10-CM | POA: Insufficient documentation

## 2022-05-22 DIAGNOSIS — K621 Rectal polyp: Secondary | ICD-10-CM | POA: Diagnosis not present

## 2022-05-22 DIAGNOSIS — K644 Residual hemorrhoidal skin tags: Secondary | ICD-10-CM | POA: Insufficient documentation

## 2022-05-22 DIAGNOSIS — K2270 Barrett's esophagus without dysplasia: Secondary | ICD-10-CM | POA: Diagnosis not present

## 2022-05-22 DIAGNOSIS — Z87891 Personal history of nicotine dependence: Secondary | ICD-10-CM | POA: Insufficient documentation

## 2022-05-22 DIAGNOSIS — Z1211 Encounter for screening for malignant neoplasm of colon: Secondary | ICD-10-CM | POA: Insufficient documentation

## 2022-05-22 DIAGNOSIS — Z9981 Dependence on supplemental oxygen: Secondary | ICD-10-CM | POA: Diagnosis not present

## 2022-05-22 DIAGNOSIS — K449 Diaphragmatic hernia without obstruction or gangrene: Secondary | ICD-10-CM | POA: Insufficient documentation

## 2022-05-22 DIAGNOSIS — K529 Noninfective gastroenteritis and colitis, unspecified: Secondary | ICD-10-CM | POA: Insufficient documentation

## 2022-05-22 DIAGNOSIS — K317 Polyp of stomach and duodenum: Secondary | ICD-10-CM | POA: Diagnosis not present

## 2022-05-22 DIAGNOSIS — I2510 Atherosclerotic heart disease of native coronary artery without angina pectoris: Secondary | ICD-10-CM | POA: Insufficient documentation

## 2022-05-22 DIAGNOSIS — Z90710 Acquired absence of both cervix and uterus: Secondary | ICD-10-CM | POA: Diagnosis not present

## 2022-05-22 DIAGNOSIS — K5730 Diverticulosis of large intestine without perforation or abscess without bleeding: Secondary | ICD-10-CM | POA: Insufficient documentation

## 2022-05-22 DIAGNOSIS — K2100 Gastro-esophageal reflux disease with esophagitis, without bleeding: Secondary | ICD-10-CM | POA: Insufficient documentation

## 2022-05-22 DIAGNOSIS — Z980 Intestinal bypass and anastomosis status: Secondary | ICD-10-CM | POA: Diagnosis not present

## 2022-05-22 HISTORY — PX: ESOPHAGOGASTRODUODENOSCOPY: SHX5428

## 2022-05-22 HISTORY — PX: COLONOSCOPY: SHX5424

## 2022-05-22 SURGERY — COLONOSCOPY
Anesthesia: General

## 2022-05-22 MED ORDER — PROPOFOL 10 MG/ML IV BOLUS
INTRAVENOUS | Status: DC | PRN
  Administered 2022-05-22: 20 mg via INTRAVENOUS
  Administered 2022-05-22: 60 mg via INTRAVENOUS

## 2022-05-22 MED ORDER — LIDOCAINE HCL (CARDIAC) PF 100 MG/5ML IV SOSY
PREFILLED_SYRINGE | INTRAVENOUS | Status: DC | PRN
  Administered 2022-05-22: 50 mg via INTRAVENOUS

## 2022-05-22 MED ORDER — SODIUM CHLORIDE 0.9 % IV SOLN: INTRAVENOUS | Status: DC

## 2022-05-22 MED ORDER — PROPOFOL 500 MG/50ML IV EMUL
INTRAVENOUS | Status: DC | PRN
  Administered 2022-05-22: 150 ug/kg/min via INTRAVENOUS

## 2022-05-22 MED ORDER — PHENYLEPHRINE HCL (PRESSORS) 10 MG/ML IV SOLN
INTRAVENOUS | Status: DC | PRN
  Administered 2022-05-22: 80 ug via INTRAVENOUS
  Administered 2022-05-22: 160 ug via INTRAVENOUS
  Administered 2022-05-22: 80 ug via INTRAVENOUS

## 2022-05-22 NOTE — Anesthesia Procedure Notes (Signed)
Date/Time: 05/22/2022 8:24 AM  Performed by: Ginger Carne, CRNAPre-anesthesia Checklist: Patient identified, Emergency Drugs available, Suction available, Patient being monitored and Timeout performed Patient Re-evaluated:Patient Re-evaluated prior to induction Oxygen Delivery Method: Nasal cannula Preoxygenation: Pre-oxygenation with 100% oxygen Induction Type: IV induction

## 2022-05-22 NOTE — Transfer of Care (Signed)
Immediate Anesthesia Transfer of Care Note  Patient: Brianna Briggs  Procedure(s) Performed: COLONOSCOPY ESOPHAGOGASTRODUODENOSCOPY (EGD)  Patient Location: Endoscopy Unit  Anesthesia Type:General  Level of Consciousness: drowsy and patient cooperative  Airway & Oxygen Therapy: Patient Spontanous Breathing  Post-op Assessment: Report given to RN and Post -op Vital signs reviewed and stable  Post vital signs: Reviewed and stable  Last Vitals:  Vitals Value Taken Time  BP 97/62 05/22/22 0903  Temp 36.1 C 05/22/22 0902  Pulse 88 05/22/22 0904  Resp 16 05/22/22 0904  SpO2 99 % 05/22/22 0904  Vitals shown include unvalidated device data.  Last Pain:  Vitals:   05/22/22 0902  TempSrc: Tympanic  PainSc: Asleep         Complications: No notable events documented.

## 2022-05-22 NOTE — Interval H&P Note (Signed)
History and Physical Interval Note: Preprocedure H&P from 05/22/22  was reviewed and there was no interval change after seeing and examining the patient.  Written consent was obtained from the patient after discussion of risks, benefits, and alternatives. Patient has consented to proceed with Esophagogastroduodenoscopy and Colonoscopy with possible intervention   05/22/2022 8:18 AM  Rolm Gala  has presented today for surgery, with the diagnosis of Chronic diarrhea (K52.9) Change in bowel habits (R19.4) S/P ileostomy (CMS-HCC) (Z93.2) Chronic colitis, unspecified (K52.9) Hx of adenomatous colonic polyps (Z86.010) Fecal urgency (R15.2) Barrett's esophagus without dysplasia (K22.70) Intestinal metaplasia of gastric mucosa (K31.A0).  The various methods of treatment have been discussed with the patient and family. After consideration of risks, benefits and other options for treatment, the patient has consented to  Procedure(s): COLONOSCOPY (N/A) ESOPHAGOGASTRODUODENOSCOPY (EGD) (N/A) as a surgical intervention.  The patient's history has been reviewed, patient examined, no change in status, stable for surgery.  I have reviewed the patient's chart and labs.  Questions were answered to the patient's satisfaction.     Jaynie Collins

## 2022-05-22 NOTE — Anesthesia Preprocedure Evaluation (Signed)
Anesthesia Evaluation  Patient identified by MRN, date of birth, ID band Patient awake    Reviewed: Allergy & Precautions, NPO status , Patient's Chart, lab work & pertinent test results  History of Anesthesia Complications Negative for: history of anesthetic complications  Airway Mallampati: III  TM Distance: <3 FB Neck ROM: full    Dental  (+) Chipped, Poor Dentition, Missing   Pulmonary COPD,  oxygen dependent, former smoker   Pulmonary exam normal        Cardiovascular Exercise Tolerance: Good + CAD  Normal cardiovascular exam     Neuro/Psych  Headaches  negative psych ROS   GI/Hepatic negative GI ROS, Neg liver ROS,neg GERD  ,,  Endo/Other  negative endocrine ROS    Renal/GU negative Renal ROS  negative genitourinary   Musculoskeletal   Abdominal   Peds  Hematology negative hematology ROS (+)   Anesthesia Other Findings Past Medical History: No date: Acute on chronic respiratory failure with hypoxia (HCC) No date: Allergic genetic state 1996: Breast cancer, right (HCC)     Comment:  a.) s/p total mastectomy No date: Bronchitis No date: Chronic eczema No date: COPD (chronic obstructive pulmonary disease) (HCC) No date: Coronary artery calcification seen on CT scan No date: Dependence on nocturnal oxygen therapy     Comment:  a.) 2 L/[place] No date: Diastolic dysfunction     Comment:  a.) TTE 02/28/2021: EF 50%, triv MR/TR, G1DD; b.) TEE               07/16/2021: EF 55-60%, G1DD. No date: Diverticulitis of large intestine with abscess without  bleeding No date: DOE (dyspnea on exertion) 08/2021: History of 2019 novel coronavirus disease (COVID-19) No date: History of chicken pox No date: History of colon polyps No date: Hyperlipidemia No date: Hypophosphatemia No date: Hypotension No date: MDD (major depressive disorder) No date: Migraines No date: Perforated diverticulum of intestine No date:  Peripheral vascular disease with claudication (HCC) No date: PSVT (paroxysmal supraventricular tachycardia) No date: PVC (premature ventricular contraction) No date: Severe sepsis with lactic acidosis (HCC) No date: Single kidney 01/27/2019: Squamous cell carcinoma of skin     Comment:  Right inferior lateral thigh. KA-type No date: Thoracic aortic atherosclerosis (HCC) No date: Tobacco use  Past Surgical History: No date: ABDOMINAL HYSTERECTOMY No date: COLON SURGERY 05/01/2019: COLONOSCOPY WITH PROPOFOL; N/A     Comment:  Procedure: COLONOSCOPY WITH PROPOFOL;  Surgeon: Toledo,               Boykin Nearing, MD;  Location: [facility];  Service:               Gastroenterology;  Laterality: N/A; 05/01/2019: ESOPHAGOGASTRODUODENOSCOPY (EGD) WITH PROPOFOL; N/A     Comment:  Procedure: ESOPHAGOGASTRODUODENOSCOPY (EGD) WITH               PROPOFOL;  Surgeon: Toledo, Boykin Nearing, MD;  Location:               [facility];  Service: Gastroenterology;  Laterality:               N/A; No date: HERNIA REPAIR 12/12/2021: ILEOSTOMY CLOSURE; N/A     Comment:  Procedure: ILEOSTOMY TAKEDOWN;  Surgeon: Carolan Shiver, MD;  Location: [facility];  Service: General;                Laterality: N/A; 06/29/2021: IR CATHETER TUBE CHANGE 06/23/2021: IR  RADIOLOGIST EVAL & MGMT 07/14/2021: IR RADIOLOGIST EVAL & MGMT 07/31/2021: LAPAROTOMY; N/A     Comment:  Procedure: EXPLORATORY LAPAROTOMY;  Surgeon:               Carolan Shiver, MD;  Location: [facility];  Service:              General;  Laterality: N/A; No date: MASTECTOMY; Right 12/12/2021: PARASTOMAL HERNIA REPAIR; N/A     Comment:  Procedure: HERNIA REPAIR PARASTOMAL;  Surgeon:               Carolan Shiver, MD;  Location: [facility];  Service:              General;  Laterality: N/A; 07/31/2021: PARTIAL COLECTOMY; N/A     Comment:  Procedure: Madelaine Bhat ILEOSTOMY;  Surgeon: Carolan Shiver, MD;  Location:  [facility];  Service: General;                Laterality: N/A; 01/13/2008: SHOULDER ARTHROSCOPY WITH SUBACROMIAL DECOMPRESSION,  ROTATOR CUFF REPAIR AND BICEP TENDON REPAIR; Left No date: TONSILLECTOMY No date: TUBAL LIGATION  BMI    Body Mass Index: 24.95 kg/m      Reproductive/Obstetrics negative OB ROS                             Anesthesia Physical Anesthesia Plan  ASA: 3  Anesthesia Plan: General   Post-op Pain Management:    Induction: Intravenous  PONV Risk Score and Plan: Propofol infusion and TIVA  Airway Management Planned: Natural Airway and Nasal Cannula  Additional Equipment:   Intra-op Plan:   Post-operative Plan:   Informed Consent: I have reviewed the patients History and Physical, chart, labs and discussed the procedure including the risks, benefits and alternatives for the proposed anesthesia with the patient or authorized representative who has indicated his/her understanding and acceptance.     Dental Advisory Given  Plan Discussed with: Anesthesiologist, CRNA and Surgeon  Anesthesia Plan Comments: (Patient consented for risks of anesthesia including but not limited to:  - adverse reactions to medications - risk of airway placement if required - damage to eyes, teeth, lips or other oral mucosa - nerve damage due to positioning  - sore throat or hoarseness - Damage to heart, brain, nerves, lungs, other parts of body or loss of life  Patient voiced understanding.)       Anesthesia Quick Evaluation

## 2022-05-22 NOTE — Op Note (Signed)
[facility] Patient Name: Brianna Briggs Procedure Date: 05/22/2022 8:08 AM MRN: [id number] Account #: [id number] Date of Birth: 09/28/1950 Admit Type: Outpatient Age: 72 Room: [facility] ENDO ROOM 2 Gender: Female Note Status: Finalized Instrument Name: Peds Colonoscope [id number] Procedure:             Colonoscopy Indications:           High risk colon cancer surveillance: Personal history                         of colonic polyps Providers:             Jaynie Collins DO, DO Medicines:             Monitored Anesthesia Care Complications:         No immediate complications. Estimated blood loss:                         Minimal. Procedure:             Pre-Anesthesia Assessment:                        - Prior to the procedure, a History and Physical was                         performed, and patient medications and allergies were                         reviewed. The patient is competent. The risks and                         benefits of the procedure and the sedation options and                         risks were discussed with the patient. All questions                         were answered and informed consent was obtained.                         Patient identification and proposed procedure were                         verified by the physician, the nurse, the anesthetist                         and the technician in the endoscopy suite. Mental                         Status Examination: alert and oriented. Airway                         Examination: normal oropharyngeal airway and neck                         mobility. Respiratory Examination: clear to                         auscultation. CV Examination: RRR, no murmurs, no S3  or S4. Prophylactic Antibiotics: The patient does not                         require prophylactic antibiotics. Prior                         Anticoagulants: The patient has taken no anticoagulant                          or antiplatelet agents. ASA Grade Assessment: III - A                         patient with severe systemic disease. After reviewing                         the risks and benefits, the patient was deemed in                         satisfactory condition to undergo the procedure. The                         anesthesia plan was to use monitored anesthesia care                         (MAC). Immediately prior to administration of                         medications, the patient was re-assessed for adequacy                         to receive sedatives. The heart rate, respiratory                         rate, oxygen saturations, blood pressure, adequacy of                         pulmonary ventilation, and response to care were                         monitored throughout the procedure. The physical                         status of the patient was re-assessed after the                         procedure.                        After obtaining informed consent, the colonoscope was                         passed under direct vision. Throughout the procedure,                         the patient's blood pressure, pulse, and oxygen                         saturations were monitored continuously. The  Colonoscope was introduced through the anus and                         advanced to the the terminal ileum, with                         identification of the appendiceal orifice and IC                         valve. The colonoscopy was performed without                         difficulty. The patient tolerated the procedure well.                         The quality of the bowel preparation was evaluated                         using the BBPS [facility] Bowel Preparation Scale) with                         scores of: Right Colon = 2 (minor amount of residual                         staining, small fragments of stool and/or opaque                         liquid,  but mucosa seen well), Transverse Colon = 2                         (minor amount of residual staining, small fragments of                         stool and/or opaque liquid, but mucosa seen well) and                         Left Colon = 3 (entire mucosa seen well with no                         residual staining, small fragments of stool or opaque                         liquid). The total BBPS score equals 7. The quality of                         the bowel preparation was good. The terminal ileum,                         ileocecal valve, appendiceal orifice, and rectum were                         photographed. Findings:      Hemorrhoids were found on perianal exam.      The digital rectal exam was normal. Pertinent negatives include normal       sphincter tone.      The terminal ileum appeared normal. Estimated blood loss: none.  A 3 to 4 mm polyp was found in the descending colon. The polyp was       sessile. The polyp was removed with a cold snare. Resection and       retrieval were complete. Estimated blood loss was minimal.      A 1 to 2 mm polyp was found in the rectum. The polyp was sessile. The       polyp was removed with a cold biopsy forceps. Resection and retrieval       were complete. Estimated blood loss was minimal.      There was evidence of a prior functional end-to-end colo-colonic       anastomosis at 15 cm proximal to the anus. This was patent and was       characterized by healthy appearing mucosa. The anastomosis was       traversed. Estimated blood loss: none.      Non-bleeding external and internal hemorrhoids were found during       retroflexion and during perianal exam. Estimated blood loss: none.      Normal mucosa was found in the entire colon. Biopsies were taken with a       cold forceps for histology. Biopsies placed in separate jars for right,       transverse, left colon and rectum to rule colitis given reported       history. Estimated blood loss  was minimal.      The exam was otherwise without abnormality on direct and retroflexion       views. Impression:            - Hemorrhoids found on perianal exam.                        - The examined portion of the ileum was normal.                        - One 3 to 4 mm polyp in the descending colon, removed                         with a cold snare. Resected and retrieved.                        - One 1 to 2 mm polyp in the rectum, removed with a                         cold biopsy forceps. Resected and retrieved.                        - Patent functional end-to-end colo-colonic                         anastomosis, characterized by healthy appearing mucosa.                        - Non-bleeding external and internal hemorrhoids.                        - Normal mucosa in the entire examined colon. Biopsied.                        - The examination was otherwise normal on  direct and                         retroflexion views. Recommendation:        - Patient has a contact number available for                         emergencies. The signs and symptoms of potential                         delayed complications were discussed with the patient.                         Return to normal activities tomorrow. Written                         discharge instructions were provided to the patient.                        - Discharge patient to home.                        - Resume previous diet.                        - Continue present medications.                        - No ibuprofen, naproxen, or other non-steroidal                         anti-inflammatory drugs for 5 days.                        - Await pathology results.                        - Repeat colonoscopy for surveillance based on                         pathology results.                        - Return to GI office as previously scheduled.                        - The findings and recommendations were discussed with                          the patient. Procedure Code(s):     --- Professional ---                        [phone], Colonoscopy, flexible; with removal of                         tumor(s), polyp(s), or other lesion(s) by snare                         technique                        45380, 59, Colonoscopy,  flexible; with biopsy, single                         or multiple Diagnosis Code(s):     --- Professional ---                        Z86.010, Personal history of colonic polyps                        K64.8, Other hemorrhoids                        D12.4, Benign neoplasm of descending colon                        D12.8, Benign neoplasm of rectum                        Z98.0, Intestinal bypass and anastomosis status CPT copyright 2022 American [employer] Association. All rights reserved. The codes documented in this report are preliminary and upon coder review may  be revised to meet current compliance requirements. Attending Participation:      I personally performed the entire procedure. Elfredia Nevins, DO Jaynie Collins DO, DO 05/22/2022 9:05:27 AM This report has been signed electronically. Number of Addenda: 0 Note Initiated On: 05/22/2022 8:08 AM Scope Withdrawal Time: 0 hours 14 minutes 10 seconds  Total Procedure Duration: 0 hours 15 minutes 40 seconds  Estimated Blood Loss:  Estimated blood loss was minimal.      [facility]

## 2022-05-22 NOTE — Anesthesia Postprocedure Evaluation (Signed)
Anesthesia Post Note  Patient: Lauramae Mckinsey  Procedure(s) Performed: COLONOSCOPY ESOPHAGOGASTRODUODENOSCOPY (EGD)  Patient location during evaluation: Endoscopy Anesthesia Type: General Level of consciousness: awake and alert Pain management: pain level controlled Vital Signs Assessment: post-procedure vital signs reviewed and stable Respiratory status: spontaneous breathing, nonlabored ventilation, respiratory function stable and patient connected to nasal cannula oxygen Cardiovascular status: blood pressure returned to baseline and stable Postop Assessment: no apparent nausea or vomiting Anesthetic complications: no   No notable events documented.   Last Vitals:  Vitals:   05/22/22 0912 05/22/22 0922  BP: 109/70 117/79  Pulse: 88 81  Resp: 18 17  Temp:    SpO2: 95% 96%    Last Pain:  Vitals:   05/22/22 0922  TempSrc:   PainSc: 0-No pain                 Cleda Mccreedy Charlotte Fidalgo

## 2022-05-22 NOTE — Op Note (Signed)
[facility] Patient Name: Brianna Briggs Procedure Date: 05/22/2022 8:08 AM MRN: [id number] Account #: [id number] Date of Birth: [birth year] Admit Type: Outpatient Age: 72 Room: [facility] ENDO ROOM 2 Gender: Female Note Status: Finalized Instrument Name: Upper Endoscope [id number] Procedure:             Upper GI endoscopy Indications:           Follow-up of Barrett's esophagus Providers:             Jaynie Collins DO, DO Medicines:             Monitored Anesthesia Care Complications:         No immediate complications. Estimated blood loss:                         Minimal. Procedure:             Pre-Anesthesia Assessment:                        - Prior to the procedure, a History and Physical was                         performed, and patient medications and allergies were                         reviewed. The patient is competent. The risks and                         benefits of the procedure and the sedation options and                         risks were discussed with the patient. All questions                         were answered and informed consent was obtained.                         Patient identification and proposed procedure were                         verified by the physician, the nurse, the anesthetist                         and the technician in the endoscopy suite. Mental                         Status Examination: alert and oriented. Airway                         Examination: normal oropharyngeal airway and neck                         mobility. Respiratory Examination: clear to                         auscultation. CV Examination: RRR, no murmurs, no S3                         or S4. Prophylactic Antibiotics: The patient  does not                         require prophylactic antibiotics. Prior                         Anticoagulants: The patient has taken no anticoagulant                         or antiplatelet agents. ASA Grade  Assessment: III - A                         patient with severe systemic disease. After reviewing                         the risks and benefits, the patient was deemed in                         satisfactory condition to undergo the procedure. The                         anesthesia plan was to use monitored anesthesia care                         (MAC). Immediately prior to administration of                         medications, the patient was re-assessed for adequacy                         to receive sedatives. The heart rate, respiratory                         rate, oxygen saturations, blood pressure, adequacy of                         pulmonary ventilation, and response to care were                         monitored throughout the procedure. The physical                         status of the patient was re-assessed after the                         procedure.                        After obtaining informed consent, the endoscope was                         passed under direct vision. Throughout the procedure,                         the patient's blood pressure, pulse, and oxygen                         saturations were monitored continuously. The Endoscope  was introduced through the mouth, and advanced to the                         second part of duodenum. The upper GI endoscopy was                         accomplished without difficulty. The patient tolerated                         the procedure well. Findings:      The duodenal bulb, first portion of the duodenum, second portion of the       duodenum and third portion of the duodenum were normal. Estimated blood       loss: none.      A single 3 to 4 mm sessile polyp with no bleeding and no stigmata of       recent bleeding was found at the pylorus. Biopsies were taken with a       cold forceps for histology. Estimated blood loss was minimal.      Normal mucosa was found in the entire examined stomach.  Imaging was       performed using narrow band imaging to visualize the mucosa. Biopsies       were taken with a cold forceps for histology. Biopsies in separate       bottles (2) from incisura, antrum and gastric body. Estimated blood loss       was minimal.      A 2 cm hiatal hernia was present. Estimated blood loss: none.      The exam of the stomach was otherwise normal.      The Z-line was regular. Biopsies were taken with a cold forceps for       histology. Estimated blood loss was minimal.      LA Grade A (one or more mucosal breaks less than 5 mm, not extending       between tops of 2 mucosal folds) esophagitis with no bleeding was found.       Estimated blood loss: none.      Esophagogastric landmarks were identified: the gastroesophageal junction       was found at 36 cm from the incisors.      The exam of the esophagus was otherwise normal. Impression:            - Normal duodenal bulb, first portion of the duodenum,                         second portion of the duodenum and third portion of                         the duodenum.                        - A single gastric polyp. Biopsied.                        - Normal mucosa was found in the entire stomach.                         Biopsied.                        -  2 cm hiatal hernia.                        - Z-line regular. Biopsied.                        - LA Grade A reflux esophagitis with no bleeding.                        - Esophagogastric landmarks identified. Recommendation:        - Patient has a contact number available for                         emergencies. The signs and symptoms of potential                         delayed complications were discussed with the patient.                         Return to normal activities tomorrow. Written                         discharge instructions were provided to the patient.                        - Discharge patient to home.                        - Resume previous  diet.                        - Continue present medications.                        - Restart proton pump inhibitor therapy. Call office                         if refill needed                        - Await pathology results.                        - Repeat upper endoscopy for surveillance based on                         pathology results.                        - Return to GI office as previously scheduled.                        - proceed with colonoscopy                        - The findings and recommendations were discussed with                         the patient. Procedure Code(s):     --- Professional ---  40981, Esophagogastroduodenoscopy, flexible,                         transoral; with biopsy, single or multiple Diagnosis Code(s):     --- Professional ---                        K22.70, Barrett's esophagus without dysplasia                        K31.7, Polyp of stomach and duodenum                        K44.9, Diaphragmatic hernia without obstruction or                         gangrene                        K21.00, Gastro-esophageal reflux disease with                         esophagitis, without bleeding CPT copyright 2022 American Medical Association. All rights reserved. The codes documented in this report are preliminary and upon coder review may  be revised to meet current compliance requirements. Attending Participation:      I personally performed the entire procedure. Elfredia Nevins, DO Jaynie Collins DO, DO 05/22/2022 8:40:29 AM This report has been signed electronically. Number of Addenda: 0 Note Initiated On: 05/22/2022 8:08 AM Estimated Blood Loss:  Estimated blood loss was minimal.      [facility]

## 2022-05-23 ENCOUNTER — Encounter: Payer: Self-pay | Admitting: Gastroenterology

## 2022-05-23 LAB — SURGICAL PATHOLOGY

## 2022-05-24 ENCOUNTER — Encounter: Payer: Self-pay | Admitting: Gastroenterology

## 2022-07-11 ENCOUNTER — Ambulatory Visit (INDEPENDENT_AMBULATORY_CARE_PROVIDER_SITE_OTHER): Payer: Medicare Other | Admitting: Dermatology

## 2022-07-11 VITALS — BP 131/79

## 2022-07-11 DIAGNOSIS — L299 Pruritus, unspecified: Secondary | ICD-10-CM

## 2022-07-11 DIAGNOSIS — L209 Atopic dermatitis, unspecified: Secondary | ICD-10-CM

## 2022-07-11 DIAGNOSIS — Z7189 Other specified counseling: Secondary | ICD-10-CM | POA: Diagnosis not present

## 2022-07-11 DIAGNOSIS — L2089 Other atopic dermatitis: Secondary | ICD-10-CM

## 2022-07-11 NOTE — Patient Instructions (Signed)
Instructions for [facility] Medications  One or more of your medications was sent to the [facility] mail order compounding pharmacy. You will receive an email from them and can purchase the medicine through that link. It will then be mailed to your home at the address you confirmed. If for any reason you do not receive an email from them, please check your spam folder. If you still do not find the email, please let us know. [facility] phone number is [phone].    For Hives (urticaria); dermatographism or Itch: Start non sedating antihistamine (either Allegra 180mg , or Claritin 10mg , or Zyrtec 10mg ) daily.  All these are non-prescription ("Over the Counter").   Start out with 1 pill a day.   After a week if not improving may increase to 2 pills a day.   After another week if not improving may increase to 3 pills a day.   After another week if still not improving may take up to 4 pills a day. Stay at highest dose that keeps condition controlled, but only up to 4 pills a day. Stay at the controlling dose for at least 2 weeks. Contact office if taking 4 pills of antihistamine a day for at least 2 weeks without control of condition as other options may be available.   Due to recent changes in healthcare laws, you may see results of your pathology and/or laboratory studies on MyChart before the doctors have had a chance to review them. We understand that in some cases there may be results that are confusing or concerning to you. Please understand that not all results are received at the same time and often the doctors may need to interpret multiple results in order to provide you with the best plan of care or course of treatment. Therefore, we ask that you please give [place] 2 business days to thoroughly review all your results before contacting the office for clarification. Should we see a critical lab result, you will be contacted sooner.   If You Need Anything After Your Visit  If  you have any questions or concerns for your doctor, please call our main line at [phone] and press option 4 to reach your doctor's medical assistant. If no one answers, please leave a voicemail as directed and we will return your call as soon as possible. Messages left after 4 pm will be answered the following business day.   You may also send [place] a message via MyChart. We typically respond to MyChart messages within 1-2 business days.  For prescription refills, please ask your pharmacy to contact our office. Our fax number is [phone].  If you have an urgent issue when the clinic is closed that cannot wait until the next business day, you can page your doctor at the number below.    Please note that while we do our best to be available for urgent issues outside of office hours, we are not available 24/7.   If you have an urgent issue and are unable to reach [place], you may choose to seek medical care at your doctor's office, retail clinic, urgent care center, or emergency room.  If you have a medical emergency, please immediately call 911 or go to the emergency department.  Pager Numbers  - Dr. Gwen Pounds: [phone]  - Dr. Neale Burly: [phone]  - Dr. Roseanne Reno: [phone]  In the event of inclement weather, please call our main line at [phone] for an update on the status of any delays or closures.  Dermatology Medication Tips: Please keep the boxes that topical medications come in in order to help keep track of the instructions about where and how to use these. Pharmacies typically print the medication instructions only on the boxes and not directly on the medication tubes.   If your medication is too expensive, please contact our office at [phone] option 4 or send [place] a message through MyChart.   We are unable to tell what your co-pay for medications will be in advance as this is different depending on your insurance coverage. However, we may be able to find a substitute  medication at lower cost or fill out paperwork to get insurance to cover a needed medication.   If a prior authorization is required to get your medication covered by your insurance company, please allow [place] 1-2 business days to complete this process.  Drug prices often vary depending on where the prescription is filled and some pharmacies may offer cheaper prices.  The website www.goodrx.com contains coupons for medications through different pharmacies. The prices here do not account for what the cost may be with help from insurance (it may be cheaper with your insurance), but the website can give you the price if you did not use any insurance.  - You can print the associated coupon and take it with your prescription to the pharmacy.  - You may also stop by our office during regular business hours and pick up a GoodRx coupon card.  - If you need your prescription sent electronically to a different pharmacy, notify our office through [facility] or by phone at [phone] option 4.     Si Usted Necesita Algo Despus de Su Visita  Tambin puede enviarnos un mensaje a travs de Clinical cytogeneticist. Por lo general respondemos a los mensajes de MyChart en el transcurso de 1 a 2 das hbiles.  Para renovar recetas, por favor pida a su farmacia que se ponga en contacto con nuestra oficina. Annie Sable de fax es [place] [phone].  Si tiene un asunto urgente cuando la clnica est cerrada y que no puede esperar hasta el siguiente da hbil, puede llamar/localizar a su doctor(a) al nmero que aparece a continuacin.   Por favor, tenga en cuenta que aunque hacemos todo lo posible para estar disponibles para asuntos urgentes fuera del horario de [place], no estamos disponibles las 24 horas del da, los 7 [address]  [address] de la [place].   Si tiene un problema urgente y no puede comunicarse con nosotros, puede optar por buscar atencin mdica  en el consultorio de su doctor(a), en una clnica privada, en un centro de  atencin urgente o en una sala de emergencias.  Si tiene Engineer, drilling, por favor llame inmediatamente al 911 o vaya a la sala de emergencias.  Nmeros de bper  - Dr. Gwen Pounds: [phone]  - Dra. Moye: [phone]  - Dra. Roseanne Reno: [phone]  En caso de inclemencias del [place], por favor llame a Lacy Duverney principal al [phone] para una actualizacin sobre el [place] de cualquier retraso o cierre.  Consejos para la medicacin en dermatologa: Por favor, guarde las cajas en las que vienen los medicamentos de uso tpico para ayudarle a seguir las instrucciones sobre dnde y cmo usarlos. Las farmacias generalmente imprimen las instrucciones del medicamento slo en las cajas y no directamente en los tubos del [place].   Si su medicamento es muy caro, por favor, pngase en contacto con Rolm Gala llamando al [phone] y presione la opcin 4 o envenos un  mensaje a travs de MyChart.   No podemos decirle cul ser su copago por los medicamentos por adelantado ya que esto es diferente dependiendo de la cobertura de su seguro. Sin embargo, es posible que podamos encontrar un medicamento sustituto a Audiological scientist un formulario para que el seguro cubra el medicamento que se considera necesario.   Si se requiere una autorizacin previa para que su compaa de seguros [place] su medicamento, por favor permtanos de 1 a 2 das hbiles para completar [address]  Los precios de los medicamentos varan con frecuencia dependiendo del Environmental consultant de dnde se surte la receta y alguna farmacias pueden ofrecer precios ms baratos.  El sitio web www.goodrx.com tiene cupones para medicamentos de Health and safety inspector. Los precios aqu no tienen en cuenta lo que podra costar con la ayuda del seguro (puede ser ms barato con su seguro), pero el sitio web puede darle el precio si no utiliz Tourist information centre manager.  - Puede imprimir el cupn correspondiente y llevarlo con su receta a la  farmacia.  - Tambin puede pasar por nuestra oficina durante el horario de atencin regular y Education officer, museum una tarjeta de cupones de GoodRx.  - Si necesita que su receta se enve electrnicamente a una farmacia diferente, informe a nuestra oficina a travs de MyChart de [place] o por telfono llamando al [phone] y presione la opcin 4.

## 2022-07-11 NOTE — Progress Notes (Unsigned)
   Follow-Up Visit   Subjective  Brianna Briggs is a 72 y.o. female who presents for the following: Blotches of arm that itch in areas of eczema x a couple weeks - she has been using Mometasone and Tacrolimus but not consistently.  The following portions of the chart were reviewed this encounter and updated as appropriate: medications, allergies, medical history  Review of Systems:  No other skin or systemic complaints except as noted in HPI or Assessment and Plan.  Objective  Well appearing patient in no apparent distress; mood and affect are within normal limits. A focused examination was performed of the following areas: back, arms Relevant exam findings are noted in the Assessment and Plan.   Assessment & Plan   ATOPIC DERMATITIS Severe and flared with severe itch She has only ever accepted topical treatment in past despite being offered and recommended for systemic Dupixent or Adbry treatment -- she continues to decline this today. Exam: Scaly pink papules coalescing to plaques         Flared  Atopic dermatitis (eczema) is a chronic, relapsing, pruritic condition that can significantly affect quality of life. It is often associated with allergic rhinitis and/or asthma and can require treatment with topical medications, phototherapy, or in severe cases biologic injectable medication (Dupixent; Adbry) or Oral JAK inhibitors.  Treatment Plan:  Will prescribe [facility] compounded prescription anti-itch cream with Amitriptyline 5% / Gabapentin 10% / Lidocaine 5% Cream.  The patient was advised this is not covered by insurance since it is made by a compounding pharmacy. They will receive an email to check out and the medication will be mailed to their home.    Start non sedating antihistamine (either Allegra 180mg , or Claritin 10mg , or Zyrtec 10mg ) daily.  All these are non-prescription ("Over the Counter").   Start out with 1 pill a day.   After a week if not  improving may increase to 2 pills a day.   After another week if not improving may increase to 3 pills a day.   After another week if still not improving may take up to 4 pills a day. Stay at highest dose that keeps condition controlled, but only up to 4 pills a day. Stay at the controlling dose for at least 2 weeks. Contact office if taking 4 pills of antihistamine a day for at least 2 weeks without control of condition as other options may be available.  Discussed Dupixent injections. Patient declines today. She will review the information given today and consider on follow up.   Continue Tacrolimus ointment bid  Recommend gentle skin care.   Return for Follow up as scheduled.  I, Joanie Coddington, CMA, am acting as scribe for Armida Sans, MD .  Documentation: I have reviewed the above documentation for accuracy and completeness, and I agree with the above.  Armida Sans, MD

## 2022-07-12 ENCOUNTER — Encounter: Payer: Self-pay | Admitting: Dermatology

## 2022-09-06 ENCOUNTER — Ambulatory Visit (INDEPENDENT_AMBULATORY_CARE_PROVIDER_SITE_OTHER): Payer: Medicare Other | Admitting: Dermatology

## 2022-09-06 VITALS — BP 129/80 | HR 115

## 2022-09-06 DIAGNOSIS — L820 Inflamed seborrheic keratosis: Secondary | ICD-10-CM | POA: Diagnosis not present

## 2022-09-06 DIAGNOSIS — L2089 Other atopic dermatitis: Secondary | ICD-10-CM

## 2022-09-06 DIAGNOSIS — L918 Other hypertrophic disorders of the skin: Secondary | ICD-10-CM | POA: Diagnosis not present

## 2022-09-06 DIAGNOSIS — L209 Atopic dermatitis, unspecified: Secondary | ICD-10-CM | POA: Diagnosis not present

## 2022-09-06 DIAGNOSIS — L650 Telogen effluvium: Secondary | ICD-10-CM

## 2022-09-06 DIAGNOSIS — Z7189 Other specified counseling: Secondary | ICD-10-CM

## 2022-09-06 DIAGNOSIS — D224 Melanocytic nevi of scalp and neck: Secondary | ICD-10-CM

## 2022-09-06 DIAGNOSIS — L299 Pruritus, unspecified: Secondary | ICD-10-CM | POA: Diagnosis not present

## 2022-09-06 DIAGNOSIS — D229 Melanocytic nevi, unspecified: Secondary | ICD-10-CM

## 2022-09-06 NOTE — Patient Instructions (Addendum)
Cryotherapy Aftercare  Wash gently with soap and water everyday.   Apply Vaseline and Band-Aid daily until healed.     Tacrolimus ointment apply twice a day to all eczema areas Mometasone cream apply to irritated eczema skin 3 days a week only    Due to recent changes in healthcare laws, you may see results of your pathology and/or laboratory studies on MyChart before the doctors have had a chance to review them. We understand that in some cases there may be results that are confusing or concerning to you. Please understand that not all results are received at the same time and often the doctors may need to interpret multiple results in order to provide you with the best plan of care or course of treatment. Therefore, we ask that you please give [place] 2 business days to thoroughly review all your results before contacting the office for clarification. Should we see a critical lab result, you will be contacted sooner.   If You Need Anything After Your Visit  If you have any questions or concerns for your doctor, please call our main line at [phone] and press option 4 to reach your doctor's medical assistant. If no one answers, please leave a voicemail as directed and we will return your call as soon as possible. Messages left after 4 pm will be answered the following business day.   You may also send [place] a message via MyChart. We typically respond to MyChart messages within 1-2 business days.  For prescription refills, please ask your pharmacy to contact our office. Our fax number is [phone].  If you have an urgent issue when the clinic is closed that cannot wait until the next business day, you can page your doctor at the number below.    Please note that while we do our best to be available for urgent issues outside of office hours, we are not available 24/7.   If you have an urgent issue and are unable to reach [place], you may choose to seek medical care at your doctor's office, retail  clinic, urgent care center, or emergency room.  If you have a medical emergency, please immediately call 911 or go to the emergency department.  Pager Numbers  - Dr. Gwen Pounds: [phone]  - Dr. Roseanne Reno: [phone]  - Dr. Katrinka Blazing: [phone]   In the event of inclement weather, please call our main line at [phone] for an update on the status of any delays or closures.  Dermatology Medication Tips: Please keep the boxes that topical medications come in in order to help keep track of the instructions about where and how to use these. Pharmacies typically print the medication instructions only on the boxes and not directly on the medication tubes.   If your medication is too expensive, please contact our office at [phone] option 4 or send [place] a message through MyChart.   We are unable to tell what your co-pay for medications will be in advance as this is different depending on your insurance coverage. However, we may be able to find a substitute medication at lower cost or fill out paperwork to get insurance to cover a needed medication.   If a prior authorization is required to get your medication covered by your insurance company, please allow [place] 1-2 business days to complete this process.  Drug prices often vary depending on where the prescription is filled and some pharmacies may offer cheaper prices.  The website www.goodrx.com contains coupons for medications through different pharmacies. The  prices here do not account for what the cost may be with help from insurance (it may be cheaper with your insurance), but the website can give you the price if you did not use any insurance.  - You can print the associated coupon and take it with your prescription to the pharmacy.  - You may also stop by our office during regular business hours and pick up a GoodRx coupon card.  - If you need your prescription sent electronically to a different pharmacy, notify our office through [facility] or by phone at [phone] option 4.     Si Usted Necesita Algo Despus de Su Visita  Tambin puede enviarnos un mensaje a travs de Clinical cytogeneticist. Por lo general respondemos a los mensajes de MyChart en el transcurso de 1 a 2 das hbiles.  Para renovar recetas, por favor pida a su farmacia que se ponga en contacto con nuestra oficina. Annie Sable de fax es [place] [phone].  Si tiene un asunto urgente cuando la clnica est cerrada y que no puede esperar hasta el siguiente da hbil, puede llamar/localizar a su doctor(a) al nmero que aparece a continuacin.   Por favor, tenga en cuenta que aunque hacemos todo lo posible para estar disponibles para asuntos urgentes fuera del horario de [place], no estamos disponibles las 24 horas del da, los 7 [address]  [address] de la [place].   Si tiene un problema urgente y no puede comunicarse con nosotros, puede optar por buscar atencin mdica  en el consultorio de su doctor(a), en una clnica privada, en un centro de atencin urgente o en una sala de emergencias.  Si tiene Engineer, drilling, por favor llame inmediatamente al 911 o vaya a la sala de emergencias.  Nmeros de bper  - Dr. Gwen Pounds: [phone]  - Dra. Roseanne Reno: [phone]  - Dr. Katrinka Blazing: [phone]   En caso de inclemencias del tiempo, por favor llame a Lacy Duverney principal al [phone] para una actualizacin sobre el [place] de cualquier retraso o cierre.  Consejos para la medicacin en dermatologa: Por favor, guarde las cajas en las que vienen los medicamentos de uso tpico para ayudarle a seguir las instrucciones sobre dnde y cmo usarlos. Las farmacias generalmente imprimen las instrucciones del medicamento slo en las cajas y no directamente en los tubos del [place].   Si su medicamento es muy caro, por favor, pngase en contacto con Rolm Gala llamando al [phone] y presione la opcin 4 o envenos un mensaje a travs de Clinical cytogeneticist.   No podemos  decirle cul ser su copago por los medicamentos por adelantado ya que esto es diferente dependiendo de la cobertura de su seguro. Sin embargo, es posible que podamos encontrar un medicamento sustituto a Audiological scientist un formulario para que el seguro cubra el medicamento que se considera necesario.   Si se requiere una autorizacin previa para que su compaa de seguros [place] su medicamento, por favor permtanos de 1 a 2 das hbiles para completar [address]  Los precios de los medicamentos varan con frecuencia dependiendo del Environmental consultant de dnde se surte la receta y alguna farmacias pueden ofrecer precios ms baratos.  El sitio web www.goodrx.com tiene cupones para medicamentos de Health and safety inspector. Los precios aqu no tienen en cuenta lo que podra costar con la ayuda del seguro (puede ser ms barato con su seguro), pero el sitio web puede darle el precio si no utiliz Tourist information centre manager.  - Puede imprimir el cupn correspondiente y llevarlo con su receta  a la farmacia.  - Tambin puede pasar por nuestra oficina durante el horario de atencin regular y Education officer, museum una tarjeta de cupones de GoodRx.  - Si necesita que su receta se enve electrnicamente a una farmacia diferente, informe a nuestra oficina a travs de MyChart de [place] o por telfono llamando al [phone] y presione la opcin 4.

## 2022-09-06 NOTE — Progress Notes (Signed)
Follow-Up Visit   Subjective  Brianna Briggs is a 72 y.o. female who presents for the following: 2 months f/u on atopic dermatitis on her body treating with Tacrolimus ointment and Mometasone cream with a good response. The patient has spots, moles and lesions to be evaluated, some may be new or changing and the patient may have concern these could be cancer.  The following portions of the chart were reviewed this encounter and updated as appropriate: medications, allergies, medical history  Review of Systems:  No other skin or systemic complaints except as noted in HPI or Assessment and Plan.  Objective  Well appearing patient in no apparent distress; mood and affect are within normal limits.  A focused examination was performed of the following areas:face, hands, legs, arms,scalp    Relevant exam findings are noted in the Assessment and Plan.  right scalp Stuck-on, waxy, tan-brown papule-- Discussed benign etiology and prognosis.    Assessment & Plan   ATOPIC DERMATITIS- IMPROVING Severe and flared with severe itch She has only ever accepted topical treatment  BSA 7%  Chronic and persistent condition with duration or expected duration over one year. Condition is symptomatic/ bothersome to patient. Not currently at goal.   Exam: Scaly pink papules coalescing to plaques back and legs   Continue Tacrolimus ointment apply twice a day to all eczema areas Mometasone cream apply to irritated eczema skin 3 days a week only  Patient declines Dupixent injection today  Long term medication management.  Patient is using long term (months to years) prescription medication  to control their dermatologic condition.  These medications require periodic monitoring to evaluate for efficacy and side effects and may require periodic laboratory monitoring.   Inflamed seborrheic keratosis right scalp  Symptomatic, irritating, patient would like treated.   Destruction of lesion - right  scalp Complexity: simple   Destruction method: cryotherapy   Informed consent: discussed and consent obtained   Timeout:  patient name, date of birth, surgical site, and procedure verified Lesion destroyed using liquid nitrogen: Yes   Region frozen until ice ball extended beyond lesion: Yes   Outcome: patient tolerated procedure well with no complications   Post-procedure details: wound care instructions given    Other atopic dermatitis  Pruritus  Melanocytic nevus, unspecified location  Telogen effluvium  Related Medications minoxidil (LONITEN) 2.5 MG tablet Take 1/2 tablet once daily  Counseling and coordination of care  SKIN TAGS (Acrochordons) - Erythematous fleshy, skin-colored pedunculated papules - Benign appearing.  - Symptomatic, irritating, patient would like treated. - Prior to procedure, discussed risks of blister formation, small wound, skin dyspigmentation, or rare scar following treatment. Recommend Vaseline ointment to treated areas while healing. - Discussed risks (permanent scarring, infection, pain, bleeding, bruising, redness, and recurrence of the lesion) and benefits of the procedure, as well as the alternatives.  She is aware that skin tags are benign lesions, and their removal is often not considered medically necessary.  Their insurance may not cover the procedure. We recommend all patients call their insurance company to confirm whether the procedure is covered before having it done. Informed consent was obtained.  Destruction Procedure Note Destruction method: cryotherapy   Informed consent: discussed and consent obtained   Lesion destroyed using liquid nitrogen: Yes   Outcome: patient tolerated procedure well with no complications   Post-procedure details: wound care instructions given   Locations: right neck, left neck  # of Lesions Treated: 2    NEVUS  Flesh papule on  the right scalp Benign-appearing.  Observation.  Call clinic for new or  changing moles.  Recommend daily use of broad spectrum spf 30+ sunscreen to sun-exposed areas.     Telogen effluvium Scalp Telogen Effluvium Counseling Telogen effluvium is a benign, self-limited condition causing increased hair shedding usually for several months. It does not progress to baldness, and the hair eventually grows back on its own. It can be triggered by recent illness, recent surgery, thyroid disease, low iron stores, vitamin D deficiency, fad diets or rapid weight loss, hormonal changes such as pregnancy or birth control pills, and some medication. Usually the hair loss starts 2-3 months after the illness or health change. Rarely, it can continue for longer than a year. Treatments options may include oral or topical Minoxidil; Red Light scalp treatments;   Continue Biotin 2.5 mg daily    Return in about 6 months (around 03/09/2023) for Atopic dermatitis .  IAngelique Holm, CMA, am acting as scribe for Armida Sans, MD .   Documentation: I have reviewed the above documentation for accuracy and completeness, and I agree with the above.  Armida Sans, MD

## 2022-09-12 ENCOUNTER — Encounter: Payer: Self-pay | Admitting: Dermatology

## 2022-10-23 ENCOUNTER — Other Ambulatory Visit: Payer: Self-pay

## 2022-10-23 ENCOUNTER — Encounter: Payer: Medicare Other | Attending: Pulmonary Disease

## 2022-10-23 DIAGNOSIS — J449 Chronic obstructive pulmonary disease, unspecified: Secondary | ICD-10-CM

## 2022-10-23 NOTE — Progress Notes (Signed)
Virtual Visit completed. Patient informed on EP and RD appointment and 6 Minute walk test. Patient also informed of patient health questionnaires on My Chart. Patient Verbalizes understanding. Visit diagnosis can be found in [facility] 09/06/2022.

## 2022-12-04 ENCOUNTER — Encounter: Payer: Medicare Other | Attending: Pulmonary Disease | Admitting: *Deleted

## 2022-12-04 VITALS — Ht 62.0 in | Wt 148.1 lb

## 2022-12-04 DIAGNOSIS — J449 Chronic obstructive pulmonary disease, unspecified: Secondary | ICD-10-CM | POA: Insufficient documentation

## 2022-12-04 DIAGNOSIS — Z5189 Encounter for other specified aftercare: Secondary | ICD-10-CM | POA: Insufficient documentation

## 2022-12-04 NOTE — Progress Notes (Signed)
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 Goals: Education classes will be provided on a weekly basis, covering required topics. Participant will state understanding/return demonstration of topics presented.  Learning Barriers/Preferences:  Learning Barriers/Preferences - 10/23/22 1115       Learning Barriers/Preferences   Learning Barriers None    Learning Preferences None             General Pulmonary Education Topics:  Infection Prevention: - Provides verbal and written material to individual with discussion of infection control including proper hand washing and proper equipment cleaning during exercise session. Flowsheet Row Pulmonary Rehab from 12/04/2022 in [facility] Cardiac and Pulmonary Rehab  Date 12/04/22  Educator [employer]  Instruction Review Code 1- Verbalizes  Understanding       Falls Prevention: - Provides verbal and written material to individual with discussion of falls prevention and safety. Flowsheet Row Pulmonary Rehab from 12/04/2022 in [facility] Cardiac and Pulmonary Rehab  Date 12/04/22  Educator [facility]  Instruction Review Code 1- Verbalizes Understanding       Chronic Lung Disease Review: - Group verbal instruction with posters, models, PowerPoint presentations and videos,  to review new updates, new respiratory medications, new advancements in procedures and treatments. Providing information on websites and "800" numbers for continued self-education. Includes information about supplement oxygen, available portable oxygen systems, continuous and intermittent flow rates, oxygen safety, concentrators, and Medicare reimbursement for oxygen. Explanation of Pulmonary Drugs, including class, frequency, complications, importance of spacers, rinsing mouth after steroid MDI's, and proper cleaning methods for nebulizers. Review of basic lung anatomy and physiology related to function, structure, and complications of lung disease. Review of risk factors. Discussion about methods for diagnosing sleep apnea and types of masks and machines for OSA. Includes a review of the use of types of environmental controls: home humidity, furnaces, filters, dust mite/pet prevention, HEPA vacuums. Discussion about weather changes, air quality and the benefits of nasal washing. Instruction on Warning signs, infection symptoms, calling MD promptly, preventive modes, and value of vaccinations. Review of effective airway clearance, coughing and/or vibration techniques. Emphasizing that all should Create an Action Plan. Written material given at graduation. Flowsheet Row Pulmonary Rehab from 12/04/2022 in [facility] Cardiac and Pulmonary Rehab  Education need identified 12/04/22       AED/CPR: - Group verbal and written instruction with the use of models to demonstrate the basic use  of the AED with the basic ABC's of resuscitation.    Anatomy and Cardiac Procedures: - Group verbal and visual presentation and models provide information about basic cardiac anatomy and function. Reviews the testing methods done to diagnose heart disease and the outcomes of the test results. Describes the treatment choices: Medical Management, Angioplasty, or Coronary Bypass Surgery for treating various heart conditions including Myocardial Infarction, Angina, Valve Disease, and Cardiac Arrhythmias.  Written material given at graduation.   Medication Safety: - Group verbal and visual instruction to review commonly prescribed medications for heart and lung disease. Reviews the medication, class of the drug, and side effects. Includes the steps to properly store meds and maintain the prescription regimen.  Written material given at graduation.   Other: -Provides group and verbal instruction on various topics (see comments)   Knowledge Questionnaire Score:  Knowledge Questionnaire Score - 12/04/22 1621       Knowledge Questionnaire Score   Pre Score 14/18              Core Components/Risk Factors/Patient Goals at Admission:  Personal Goals and Risk Factors at Admission - 12/04/22 1620       Core Components/Risk Factors/Patient Goals on Admission    Weight Management Yes;Weight Loss    Intervention Weight Management: Develop a combined nutrition and exercise program designed to reach desired caloric intake, while maintaining appropriate intake of nutrient and fiber, sodium and fats, and appropriate energy expenditure required for the weight goal.;Weight Management: Provide education and appropriate resources to help participant work on and attain dietary goals.;Weight Management/Obesity: Establish reasonable short term and long term weight goals.    Admit Weight 148 lb 1.6 oz (67.2 kg)    Goal Weight: Short Term 143 lb (64.9 kg)    Goal Weight: Long Term 135 lb (61.2  kg)     Expected Outcomes Short Term: Continue to assess and modify interventions until short term weight is achieved;Long Term: Adherence to nutrition and physical activity/exercise program aimed toward attainment of established weight goal;Weight Loss: Understanding of general recommendations for a balanced deficit meal plan, which promotes 1-2 lb weight loss per week and includes a negative energy balance of [phone] kcal/d;Understanding recommendations for meals to include 15-35% energy as protein, 25-35% energy from fat, 35-60% energy from carbohydrates, less than 200mg  of dietary cholesterol, 20-35 gm of total fiber daily;Understanding of distribution of calorie intake throughout the day with the consumption of 4-5 meals/snacks    Improve shortness of breath with ADL's Yes    Intervention Provide education, individualized exercise plan and daily activity instruction to help decrease symptoms of SOB with activities of daily living.    Expected Outcomes Short Term: Improve cardiorespiratory fitness to achieve a reduction of symptoms when performing ADLs;Long Term: Be able to perform more ADLs without symptoms or delay the onset of symptoms    Lipids Yes    Intervention Provide education and support for participant on nutrition & aerobic/resistive exercise along with prescribed medications to achieve LDL 70mg , HDL >40mg .    Expected Outcomes Short Term: Participant states understanding of desired cholesterol values and is compliant with medications prescribed. Participant is following exercise prescription and nutrition guidelines.;Long Term: Cholesterol controlled with medications as prescribed, with individualized exercise RX and with personalized nutrition plan. Value goals: LDL < 70mg , HDL > 40 mg.             Education:Diabetes - Individual verbal and written instruction to review signs/symptoms of diabetes, desired ranges of glucose level fasting, after meals and with exercise. Acknowledge that pre  and post exercise glucose checks will be done for 3 sessions at entry of program.   Know Your Numbers and Heart Failure: - Group verbal and visual instruction to discuss disease risk factors for cardiac and pulmonary disease and treatment options.  Reviews associated critical values for Overweight/Obesity, Hypertension, Cholesterol, and Diabetes.  Discusses basics of heart failure: signs/symptoms and treatments.  Introduces Heart Failure Zone chart for action plan for heart failure.  Written material given at graduation.   Core Components/Risk Factors/Patient Goals Review:    Core Components/Risk Factors/Patient Goals at Discharge (Final Review):    ITP Comments:  ITP Comments     Row Name 10/23/22 1112 12/04/22 1604         ITP Comments Virtual Visit completed. Patient informed on EP and RD appointment and 6 Minute walk test. Patient also informed of patient health questionnaires on My Chart. Patient Verbalizes understanding. Visit diagnosis can be found in [facility] 09/06/2022. Completed and gym orientation. Initial ITP created and sent for review to Dr. Jinny Sanders, Medical Director.               Comments: initial ITP

## 2022-12-04 NOTE — Patient Instructions (Signed)
Patient Instructions  Patient Details  Name: Brianna Briggs MRN: [id number] Date of Birth: [birth year] Referring Provider:  Lauro Regulus, MD  Below are your personal goals for exercise, nutrition, and risk factors. Our goal is to help you stay on track towards obtaining and maintaining these goals. We will be discussing your progress on these goals with you throughout the program.  Initial Exercise Prescription:  Initial Exercise Prescription - 12/04/22 1600       Date of Initial Exercise RX and Referring Provider   Date 12/04/22    Referring Provider Dr. Inda Coke      Oxygen   Oxygen Continuous    Liters 2   use supplemental O2 as needed up to 2 L to maintain SaO2 >88%   Maintain Oxygen Saturation 88% or higher      Treadmill   MPH 1.3    Grade 1    Minutes 15    METs 2.17      NuStep   Level 1   T6   SPM 80    Minutes 15    METs 2.4      REL-XR   Level 1    Speed 50    Minutes 15    METs 2.4      Biostep-RELP   Level 1    SPM 50    Minutes 15    METs 2.4      Prescription Details   Frequency (times per week) 3    Duration Progress to 30 minutes of continuous aerobic without signs/symptoms of physical distress      Intensity   THRR 40-80% of Max Heartrate 116-140    Ratings of Perceived Exertion 11-13    Perceived Dyspnea 0-4      Progression   Progression Continue to progress workloads to maintain intensity without signs/symptoms of physical distress.      Resistance Training   Training Prescription Yes    Weight 1    Reps 10-15             Exercise Goals: Frequency: Be able to perform aerobic exercise two to three times per week in program working toward 2-5 days per week of home exercise.  Intensity: Work with a perceived exertion of 11 (fairly light) - 15 (hard) while following your exercise prescription.  We will make changes to your prescription with you as you progress through the program.   Duration: Be able to do 30  to 45 minutes of continuous aerobic exercise in addition to a 5 minute warm-up and a 5 minute cool-down routine.   Nutrition Goals: Your personal nutrition goals will be established when you do your nutrition analysis with the dietician.  The following are general nutrition guidelines to follow: Cholesterol < 200mg /day Sodium < 1500mg /day Fiber: Women over 50 yrs - 21 grams per day  Personal Goals:  Personal Goals and Risk Factors at Admission - 12/04/22 1620       Core Components/Risk Factors/Patient Goals on Admission    Weight Management Yes;Weight Loss    Intervention Weight Management: Develop a combined nutrition and exercise program designed to reach desired caloric intake, while maintaining appropriate intake of nutrient and fiber, sodium and fats, and appropriate energy expenditure required for the weight goal.;Weight Management: Provide education and appropriate resources to help participant work on and attain dietary goals.;Weight Management/Obesity: Establish reasonable short term and long term weight goals.    Admit Weight 148 lb 1.6 oz (67.2 kg)  Goal Weight: Short Term 143 lb (64.9 kg)    Goal Weight: Long Term 135 lb (61.2 kg)    Expected Outcomes Short Term: Continue to assess and modify interventions until short term weight is achieved;Long Term: Adherence to nutrition and physical activity/exercise program aimed toward attainment of established weight goal;Weight Loss: Understanding of general recommendations for a balanced deficit meal plan, which promotes 1-2 lb weight loss per week and includes a negative energy balance of [phone] kcal/d;Understanding recommendations for meals to include 15-35% energy as protein, 25-35% energy from fat, 35-60% energy from carbohydrates, less than 200mg  of dietary cholesterol, 20-35 gm of total fiber daily;Understanding of distribution of calorie intake throughout the day with the consumption of 4-5 meals/snacks    Improve shortness of  breath with ADL's Yes    Intervention Provide education, individualized exercise plan and daily activity instruction to help decrease symptoms of SOB with activities of daily living.    Expected Outcomes Short Term: Improve cardiorespiratory fitness to achieve a reduction of symptoms when performing ADLs;Long Term: Be able to perform more ADLs without symptoms or delay the onset of symptoms    Lipids Yes    Intervention Provide education and support for participant on nutrition & aerobic/resistive exercise along with prescribed medications to achieve LDL 70mg , HDL >40mg .    Expected Outcomes Short Term: Participant states understanding of desired cholesterol values and is compliant with medications prescribed. Participant is following exercise prescription and nutrition guidelines.;Long Term: Cholesterol controlled with medications as prescribed, with individualized exercise RX and with personalized nutrition plan. Value goals: LDL < 70mg , HDL > 40 mg.             Tobacco Use Initial Evaluation: Social History   Tobacco Use  Smoking Status Former   Current packs/day: 0.00   Average packs/day: 1.5 packs/day for 40.0 years (60.0 ttl pk-yrs)   Types: Cigarettes   Start date: 07/1981   Quit date: 07/2021   Years since quitting: 1.3  Smokeless Tobacco Never    Exercise Goals and Review:  Exercise Goals     Row Name 12/04/22 1619             Exercise Goals   Increase Physical Activity Yes       Intervention Provide advice, education, support and counseling about physical activity/exercise needs.;Develop an individualized exercise prescription for aerobic and resistive training based on initial evaluation findings, risk stratification, comorbidities and participant's personal goals.       Expected Outcomes Short Term: Attend rehab on a regular basis to increase amount of physical activity.;Long Term: Add in home exercise to make exercise part of routine and to increase amount of  physical activity.;Long Term: Exercising regularly at least 3-5 days a week.       Increase Strength and Stamina Yes       Intervention Provide advice, education, support and counseling about physical activity/exercise needs.;Develop an individualized exercise prescription for aerobic and resistive training based on initial evaluation findings, risk stratification, comorbidities and participant's personal goals.       Expected Outcomes Short Term: Increase workloads from initial exercise prescription for resistance, speed, and METs.;Short Term: Perform resistance training exercises routinely during rehab and add in resistance training at home;Long Term: Improve cardiorespiratory fitness, muscular endurance and strength as measured by increased METs and functional capacity ( )       Able to understand and use rate of perceived exertion (RPE) scale Yes       Intervention Provide education and explanation  on how to use RPE scale       Expected Outcomes Short Term: Able to use RPE daily in rehab to express subjective intensity level;Long Term:  Able to use RPE to guide intensity level when exercising independently       Able to understand and use Dyspnea scale Yes       Intervention Provide education and explanation on how to use Dyspnea scale       Expected Outcomes Short Term: Able to use Dyspnea scale daily in rehab to express subjective sense of shortness of breath during exertion;Long Term: Able to use Dyspnea scale to guide intensity level when exercising independently       Knowledge and understanding of Target Heart Rate Range (THRR) Yes       Intervention Provide education and explanation of THRR including how the numbers were predicted and where they are located for reference       Expected Outcomes Short Term: Able to state/look up THRR;Long Term: Able to use THRR to govern intensity when exercising independently;Short Term: Able to use daily as guideline for intensity in rehab       Able to  check pulse independently Yes       Intervention Provide education and demonstration on how to check pulse in carotid and radial arteries.;Review the importance of being able to check your own pulse for safety during independent exercise       Expected Outcomes Short Term: Able to explain why pulse checking is important during independent exercise;Long Term: Able to check pulse independently and accurately       Understanding of Exercise Prescription Yes       Intervention Provide education, explanation, and written materials on patient's individual exercise prescription       Expected Outcomes Short Term: Able to explain program exercise prescription;Long Term: Able to explain home exercise prescription to exercise independently                Copy of goals given to participant.

## 2022-12-06 ENCOUNTER — Encounter: Payer: Self-pay | Admitting: *Deleted

## 2022-12-06 DIAGNOSIS — J449 Chronic obstructive pulmonary disease, unspecified: Secondary | ICD-10-CM

## 2022-12-06 NOTE — Progress Notes (Signed)
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 Goals: Education classes will be provided on a weekly basis, covering required topics. Participant will state understanding/return demonstration of topics presented.  Learning Barriers/Preferences:  Learning Barriers/Preferences - 10/23/22 1115       Learning Barriers/Preferences   Learning Barriers None    Learning Preferences None             General Pulmonary Education Topics:  Infection Prevention: - Provides verbal and written material to individual with discussion of infection control including proper hand washing and proper equipment cleaning during exercise session. Flowsheet Row Pulmonary Rehab from 12/04/2022 in [facility] Cardiac and Pulmonary Rehab  Date 12/04/22  Educator [employer]  Instruction Review Code 1- Verbalizes  Understanding       Falls Prevention: - Provides verbal and written material to individual with discussion of falls prevention and safety. Flowsheet Row Pulmonary Rehab from 12/04/2022 in [facility] Cardiac and Pulmonary Rehab  Date 12/04/22  Educator [facility]  Instruction Review Code 1- Verbalizes Understanding       Chronic Lung Disease Review: - Group verbal instruction with posters, models, PowerPoint presentations and videos,  to review new updates, new respiratory medications, new advancements in procedures and treatments. Providing information on websites and "800" numbers for continued self-education. Includes information about supplement oxygen, available portable oxygen systems, continuous and intermittent flow rates, oxygen safety, concentrators, and Medicare reimbursement for oxygen. Explanation of Pulmonary Drugs, including class, frequency, complications, importance of spacers, rinsing mouth after steroid MDI's, and proper cleaning methods for nebulizers. Review of basic lung anatomy and physiology related to function, structure, and complications of lung disease. Review of risk factors. Discussion about methods for diagnosing sleep apnea and types of masks and machines for OSA. Includes a review of the use of types of environmental controls: home humidity, furnaces, filters, dust mite/pet prevention, HEPA vacuums. Discussion about weather changes, air quality and the benefits of nasal washing. Instruction on Warning signs, infection symptoms, calling MD promptly, preventive modes, and value of vaccinations. Review of effective airway clearance, coughing and/or vibration techniques. Emphasizing that all should Create an Action Plan. Written material given at graduation. Flowsheet Row Pulmonary Rehab from 12/04/2022 in [facility] Cardiac and Pulmonary Rehab  Education need identified 12/04/22       AED/CPR: - Group verbal and written instruction with the use of models to demonstrate the basic use  of the AED with the basic ABC's of resuscitation.    Anatomy and Cardiac Procedures: - Group verbal and visual presentation and models provide information about basic cardiac anatomy and function. Reviews the testing methods done to diagnose heart disease and the outcomes of the test results. Describes the treatment choices: Medical Management, Angioplasty, or Coronary Bypass Surgery for treating various heart conditions including Myocardial Infarction, Angina, Valve Disease, and Cardiac Arrhythmias.  Written material given at graduation.   Medication Safety: - Group verbal and visual instruction to review commonly prescribed medications for heart and lung disease. Reviews the medication, class of the drug, and side effects. Includes the steps to properly store meds and maintain the prescription regimen.  Written material given at graduation.   Other: -Provides group and verbal instruction on various topics (see comments)   Knowledge Questionnaire Score:  Knowledge Questionnaire Score - 12/04/22 1621       Knowledge Questionnaire Score   Pre Score 14/18              Core Components/Risk Factors/Patient Goals at Admission:  Personal Goals and Risk Factors at Admission - 12/04/22 1620       Core Components/Risk Factors/Patient Goals on Admission    Weight Management Yes;Weight Loss    Intervention Weight Management: Develop a combined nutrition and exercise program designed to reach desired caloric intake, while maintaining appropriate intake of nutrient and fiber, sodium and fats, and appropriate energy expenditure required for the weight goal.;Weight Management: Provide education and appropriate resources to help participant work on and attain dietary goals.;Weight Management/Obesity: Establish reasonable short term and long term weight goals.    Admit Weight 148 lb 1.6 oz (67.2 kg)    Goal Weight: Short Term 143 lb (64.9 kg)    Goal Weight: Long Term 135 lb (61.2  kg)     Expected Outcomes Short Term: Continue to assess and modify interventions until short term weight is achieved;Long Term: Adherence to nutrition and physical activity/exercise program aimed toward attainment of established weight goal;Weight Loss: Understanding of general recommendations for a balanced deficit meal plan, which promotes 1-2 lb weight loss per week and includes a negative energy balance of [phone] kcal/d;Understanding recommendations for meals to include 15-35% energy as protein, 25-35% energy from fat, 35-60% energy from carbohydrates, less than 200mg  of dietary cholesterol, 20-35 gm of total fiber daily;Understanding of distribution of calorie intake throughout the day with the consumption of 4-5 meals/snacks    Improve shortness of breath with ADL's Yes    Intervention Provide education, individualized exercise plan and daily activity instruction to help decrease symptoms of SOB with activities of daily living.    Expected Outcomes Short Term: Improve cardiorespiratory fitness to achieve a reduction of symptoms when performing ADLs;Long Term: Be able to perform more ADLs without symptoms or delay the onset of symptoms    Lipids Yes    Intervention Provide education and support for participant on nutrition & aerobic/resistive exercise along with prescribed medications to achieve LDL 70mg , HDL >40mg .    Expected Outcomes Short Term: Participant states understanding of desired cholesterol values and is compliant with medications prescribed. Participant is following exercise prescription and nutrition guidelines.;Long Term: Cholesterol controlled with medications as prescribed, with individualized exercise RX and with personalized nutrition plan. Value goals: LDL < 70mg , HDL > 40 mg.             Education:Diabetes - Individual verbal and written instruction to review signs/symptoms of diabetes, desired ranges of glucose level fasting, after meals and with exercise. Acknowledge that pre  and post exercise glucose checks will be done for 3 sessions at entry of program.   Know Your Numbers and Heart Failure: - Group verbal and visual instruction to discuss disease risk factors for cardiac and pulmonary disease and treatment options.  Reviews associated critical values for Overweight/Obesity, Hypertension, Cholesterol, and Diabetes.  Discusses basics of heart failure: signs/symptoms and treatments.  Introduces Heart Failure Zone chart for action plan for heart failure.  Written material given at graduation.   Core Components/Risk Factors/Patient Goals Review:    Core Components/Risk Factors/Patient Goals at Discharge (Final Review):    ITP Comments:  ITP Comments     Row Name 10/23/22 1112 12/04/22 1604 12/06/22 1120       ITP Comments Virtual Visit completed. Patient informed on EP and RD appointment and 6 Minute walk test. Patient also informed of patient health questionnaires on My Chart. Patient Verbalizes understanding. Visit diagnosis can be found in [facility] 09/06/2022. Completed and gym orientation. Initial ITP created and sent for review to Dr. Jinny Sanders, Medical Director. 30 Day review completed. Medical Director ITP review done, changes made as directed, and signed approval by Medical Director.    new to program              Comments:

## 2022-12-18 ENCOUNTER — Encounter: Payer: Medicare Other | Attending: Pulmonary Disease | Admitting: *Deleted

## 2022-12-18 DIAGNOSIS — J449 Chronic obstructive pulmonary disease, unspecified: Secondary | ICD-10-CM

## 2022-12-18 DIAGNOSIS — Z5189 Encounter for other specified aftercare: Secondary | ICD-10-CM | POA: Diagnosis present

## 2022-12-18 DIAGNOSIS — Z87891 Personal history of nicotine dependence: Secondary | ICD-10-CM | POA: Insufficient documentation

## 2022-12-18 NOTE — Progress Notes (Signed)
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 She has now gained weight, currently 20lbs over her usual weight she was before surgery. Reports her hair and nails are in poor condition, Dr recommended Biotin. She felt it wasn't helping much. After speaking with her, recommended she also increase her protein intake. Set goal to eat 3 times per day. Choose nutrient and protein rich foods to help her meet needs with less quantity of food. Reviewed Mediterranean diet handout, educated on types of fats, sources and ways to use healthy fat to increase calories in smaller portions of food. Recommended protein shake to help her increase her protein intake consistently    Goal 1: Eat 15-30gProtein and 30-60gCarbs at each meal. Goal 2: Eat 3 times per day.  Goal 3: Reduce saturated fat, less than 12g per day. Replace bad fats for more heart healthy fats.   End time 3:39 PM

## 2022-12-18 NOTE — Progress Notes (Signed)
Daily Session Note  Patient Details  Name: Brianna Briggs MRN: [id number] Date of Birth: Apr 14, 1950 Referring Provider:   Flowsheet Row Pulmonary Rehab from 12/04/2022 in [facility] Cardiac and Pulmonary Rehab  Referring Provider Dr. Inda Coke       Encounter Date: 12/18/2022  Check In:  Session Check In - 12/18/22 1348       Check-In   Supervising physician immediately available to respond to emergencies See telemetry face sheet for immediately available ER MD    Location [facility]-Cardiac & Pulmonary Rehab    Staff Present Susann Givens, RN BSN;Krista Karleen Hampshire RN, BSN;Noah Tickle, BS, Exercise Physiologist;Maxon Conetta BS, , Exercise Physiologist    Virtual Visit No    Medication changes reported     No    Fall or balance concerns reported    No    Warm-up and Cool-down Performed on first and last piece of equipment    Resistance Training Performed Yes    VAD Patient? No    PAD/SET Patient? No      Pain Assessment   Currently in Pain? No/denies                Social History   Tobacco Use  Smoking Status Former   Current packs/day: 0.00   Average packs/day: 1.5 packs/day for 40.0 years (60.0 ttl pk-yrs)   Types: Cigarettes   Start date: 07/1981   Quit date: 07/2021   Years since quitting: 1.4  Smokeless Tobacco Never    Goals Met:  Independence with exercise equipment Exercise tolerated well No report of concerns or symptoms today Strength training completed today  Goals Unmet:  Not Applicable  Comments: First full day of exercise!  Patient was oriented to gym and equipment including functions, settings, policies, and procedures.  Patient's individual exercise prescription and treatment plan were reviewed.  All starting workloads were established based on the results of the 6 minute walk test done at initial orientation visit.  The plan for exercise progression was also introduced and progression will be customized based on patient's performance and  goals.     Dr. Bethann Punches is Medical Director for [facility] Cardiac Rehabilitation.  Dr. Vida Rigger is Medical Director for [facility] Pulmonary Rehabilitation.

## 2022-12-20 ENCOUNTER — Encounter: Payer: Medicare Other | Admitting: *Deleted

## 2022-12-20 DIAGNOSIS — J449 Chronic obstructive pulmonary disease, unspecified: Secondary | ICD-10-CM

## 2022-12-20 DIAGNOSIS — Z5189 Encounter for other specified aftercare: Secondary | ICD-10-CM | POA: Diagnosis not present

## 2022-12-20 NOTE — Progress Notes (Signed)
Daily Session Note  Patient Details  Name: Brianna Briggs MRN: [id number] Date of Birth: [birth year] Referring Provider:   Flowsheet Row Pulmonary Rehab from 12/04/2022 in [facility] Cardiac and Pulmonary Rehab  Referring Provider Dr. Inda Coke       Encounter Date: 12/20/2022  Check In:  Session Check In - 12/20/22 1350       Check-In   Supervising physician immediately available to respond to emergencies See telemetry face sheet for immediately available ER MD    Location [facility]-Cardiac & Pulmonary Rehab    Staff Present Ronette Deter, BS, Exercise Physiologist;Maxon [place] BS, , Exercise Physiologist;Kelly Madilyn Fireman, BS, ACSM CEP, Exercise Physiologist;Jamai Dolce Katrinka Blazing, RN, ADN    Virtual Visit No    Medication changes reported     Yes    Comments Started Prednisone and ABX eye drops    Fall or balance concerns reported    No    Warm-up and Cool-down Performed on first and last piece of equipment    Resistance Training Performed Yes    VAD Patient? No    PAD/SET Patient? No      Pain Assessment   Currently in Pain? No/denies                Social History   Tobacco Use  Smoking Status Former   Current packs/day: 0.00   Average packs/day: 1.5 packs/day for 40.0 years (60.0 ttl pk-yrs)   Types: Cigarettes   Start date: 07/1981   Quit date: 07/2021   Years since quitting: 1.4  Smokeless Tobacco Never    Goals Met:  Independence with exercise equipment Exercise tolerated well No report of concerns or symptoms today Strength training completed today  Goals Unmet:  Not Applicable  Comments: Pt able to follow exercise prescription today without complaint.  Will continue to monitor for progression.    Dr. Bethann Punches is Medical Director for [facility] Cardiac Rehabilitation.  Dr. Vida Rigger is Medical Director for [facility] Pulmonary Rehabilitation.

## 2022-12-21 ENCOUNTER — Encounter: Payer: Medicare Other | Admitting: *Deleted

## 2022-12-21 DIAGNOSIS — J449 Chronic obstructive pulmonary disease, unspecified: Secondary | ICD-10-CM

## 2022-12-21 DIAGNOSIS — Z5189 Encounter for other specified aftercare: Secondary | ICD-10-CM | POA: Diagnosis not present

## 2022-12-21 NOTE — Progress Notes (Signed)
Daily Session Note  Patient Details  Name: Brianna Briggs MRN: [id number] Date of Birth: [birth year] Referring Provider:   Flowsheet Row Pulmonary Rehab from 12/04/2022 in [facility] Cardiac and Pulmonary Rehab  Referring Provider Dr. Inda Coke       Encounter Date: 12/21/2022  Check In:  Session Check In - 12/21/22 1434       Check-In   Supervising physician immediately available to respond to emergencies See telemetry face sheet for immediately available ER MD    Location [facility]-Cardiac & Pulmonary Rehab    Staff Present Cora Collum, RN, BSN, CCRP;Joseph Hood, RCP,RRT,BSRT;Maxon [place] BS, , Exercise Physiologist    Virtual Visit No    Medication changes reported     No    Fall or balance concerns reported    No    Warm-up and Cool-down Performed on first and last piece of equipment    Resistance Training Performed Yes    VAD Patient? No    PAD/SET Patient? No      Pain Assessment   Currently in Pain? No/denies                Social History   Tobacco Use  Smoking Status Former   Current packs/day: 0.00   Average packs/day: 1.5 packs/day for 40.0 years (60.0 ttl pk-yrs)   Types: Cigarettes   Start date: 07/1981   Quit date: 07/2021   Years since quitting: 1.4  Smokeless Tobacco Never    Goals Met:  Proper associated with RPD/PD & O2 Sat Independence with exercise equipment Exercise tolerated well No report of concerns or symptoms today  Goals Unmet:  Not Applicable  Comments: Pt able to follow exercise prescription today without complaint.  Will continue to monitor for progression.    Dr. Bethann Punches is Medical Director for [facility] Cardiac Rehabilitation.  Dr. Vida Rigger is Medical Director for [facility] Pulmonary Rehabilitation.

## 2022-12-25 ENCOUNTER — Encounter: Payer: Medicare Other | Admitting: *Deleted

## 2022-12-25 DIAGNOSIS — Z5189 Encounter for other specified aftercare: Secondary | ICD-10-CM | POA: Diagnosis not present

## 2022-12-25 DIAGNOSIS — J449 Chronic obstructive pulmonary disease, unspecified: Secondary | ICD-10-CM

## 2022-12-25 NOTE — Progress Notes (Signed)
Daily Session Note  Patient Details  Name: Brianna Briggs MRN: [id number] Date of Birth: 07-04-1950 Referring Provider:   Flowsheet Row Pulmonary Rehab from 12/04/2022 in [facility] Cardiac and Pulmonary Rehab  Referring Provider Dr. Inda Coke       Encounter Date: 12/25/2022  Check In:  Session Check In - 12/25/22 1349       Check-In   Supervising physician immediately available to respond to emergencies See telemetry face sheet for immediately available ER MD    Location [facility]-Cardiac & Pulmonary Rehab    Staff Present Susann Givens, RN BSN;Joseph Garretson, RCP,RRT,BSRT;Noah Tickle, BS, Exercise Physiologist;Susanne Bice, RN, BSN, CCRP    Virtual Visit No    Medication changes reported     Yes    Comments Started Prednisone and ABX eye drops and valacyclovir 1gm-tid    Fall or balance concerns reported    No    Warm-up and Cool-down Performed on first and last piece of equipment    Resistance Training Performed Yes    VAD Patient? No    PAD/SET Patient? No      Pain Assessment   Currently in Pain? No/denies                Social History   Tobacco Use  Smoking Status Former   Current packs/day: 0.00   Average packs/day: 1.5 packs/day for 40.0 years (60.0 ttl pk-yrs)   Types: Cigarettes   Start date: 07/1981   Quit date: 07/2021   Years since quitting: 1.4  Smokeless Tobacco Never    Goals Met:  Independence with exercise equipment Exercise tolerated well No report of concerns or symptoms today Strength training completed today  Goals Unmet:  Not Applicable  Comments: Pt able to follow exercise prescription today without complaint.  Will continue to monitor for progression.    Dr. Bethann Punches is Medical Director for [facility] Cardiac Rehabilitation.  Dr. Vida Rigger is Medical Director for [facility] Pulmonary Rehabilitation.

## 2022-12-27 ENCOUNTER — Encounter: Payer: Medicare Other | Admitting: *Deleted

## 2022-12-27 DIAGNOSIS — J449 Chronic obstructive pulmonary disease, unspecified: Secondary | ICD-10-CM

## 2022-12-27 DIAGNOSIS — Z5189 Encounter for other specified aftercare: Secondary | ICD-10-CM | POA: Diagnosis not present

## 2022-12-27 NOTE — Progress Notes (Signed)
Daily Session Note  Patient Details  Name: Brianna Briggs MRN: [id number] Date of Birth: 06-28-1950 Referring Provider:   Flowsheet Row Pulmonary Rehab from 12/04/2022 in [facility] Cardiac and Pulmonary Rehab  Referring Provider Dr. Inda Coke       Encounter Date: 12/27/2022  Check In:  Session Check In - 12/27/22 1357       Check-In   Supervising physician immediately available to respond to emergencies See telemetry face sheet for immediately available ER MD    Location [facility]-Cardiac & Pulmonary Rehab    Staff Present Tommye Standard, BS, ACSM CEP, Exercise Physiologist;Megan Katrinka Blazing, RN, ADN;Dhiren Azimi Jewel Baize, RN BSN;Susanne Bice, RN, BSN, CCRP    Virtual Visit No    Medication changes reported     No    Fall or balance concerns reported    No    Warm-up and Cool-down Performed on first and last piece of equipment    Resistance Training Performed Yes    VAD Patient? No    PAD/SET Patient? No      Pain Assessment   Currently in Pain? No/denies                Social History   Tobacco Use  Smoking Status Former   Current packs/day: 0.00   Average packs/day: 1.5 packs/day for 40.0 years (60.0 ttl pk-yrs)   Types: Cigarettes   Start date: 07/1981   Quit date: 07/2021   Years since quitting: 1.4  Smokeless Tobacco Never    Goals Met:  Independence with exercise equipment Exercise tolerated well No report of concerns or symptoms today Strength training completed today  Goals Unmet:  Not Applicable  Comments: Pt able to follow exercise prescription today without complaint.  Will continue to monitor for progression.    Dr. Bethann Punches is Medical Director for [facility] Cardiac Rehabilitation.  Dr. Vida Rigger is Medical Director for [facility] Pulmonary Rehabilitation.

## 2022-12-28 ENCOUNTER — Encounter: Payer: Medicare Other | Admitting: *Deleted

## 2022-12-28 DIAGNOSIS — Z5189 Encounter for other specified aftercare: Secondary | ICD-10-CM | POA: Diagnosis not present

## 2022-12-28 DIAGNOSIS — J449 Chronic obstructive pulmonary disease, unspecified: Secondary | ICD-10-CM

## 2022-12-28 NOTE — Progress Notes (Deleted)
Daily Session Note  Patient Details  Name: Brianna Briggs MRN: [id number] Date of Birth: Mar 16, 1950 Referring Provider:   Flowsheet Row Pulmonary Rehab from 12/04/2022 in [facility] Cardiac and Pulmonary Rehab  Referring Provider Dr. Inda Coke       Encounter Date: 12/28/2022  Check In:      Social History   Tobacco Use  Smoking Status Former   Current packs/day: 0.00   Average packs/day: 1.5 packs/day for 40.0 years (60.0 ttl pk-yrs)   Types: Cigarettes   Start date: 07/1981   Quit date: 07/2021   Years since quitting: 1.4  Smokeless Tobacco Never    Goals Met:  Independence with exercise equipment Exercise tolerated well No report of concerns or symptoms today Strength training completed today  Goals Unmet:  Not Applicable  Comments: Pt able to follow exercise prescription today without complaint.  Will continue to monitor for progression.    Dr. Bethann Punches is Medical Director for [facility] Cardiac Rehabilitation.  Dr. Vida Rigger is Medical Director for [facility] Pulmonary Rehabilitation.

## 2022-12-28 NOTE — Progress Notes (Signed)
Daily Session Note  Patient Details  Name: Brianna Briggs MRN: [id number] Date of Birth: August 13, 1950 Referring Provider:   Flowsheet Row Pulmonary Rehab from 12/04/2022 in [facility] Cardiac and Pulmonary Rehab  Referring Provider Dr. Inda Coke       Encounter Date: 12/28/2022  Check In:  Session Check In - 12/28/22 1349       Check-In   Supervising physician immediately available to respond to emergencies See telemetry face sheet for immediately available ER MD    Location [facility]-Cardiac & Pulmonary Rehab    Staff Present Cora Collum, RN, BSN, CCRP;Joseph Hood, RCP,RRT,BSRT;Maxon Rowe BS, , Exercise Physiologist;Verle [place] Katrinka Blazing, RN, [place]    Virtual Visit No    Medication changes reported     No    Fall or balance concerns reported    No    Warm-up and Cool-down Performed on first and last piece of equipment    Resistance Training Performed Yes    VAD Patient? No    PAD/SET Patient? No      Pain Assessment   Currently in Pain? No/denies                Social History   Tobacco Use  Smoking Status Former   Current packs/day: 0.00   Average packs/day: 1.5 packs/day for 40.0 years (60.0 ttl pk-yrs)   Types: Cigarettes   Start date: 07/1981   Quit date: 07/2021   Years since quitting: 1.4  Smokeless Tobacco Never    Goals Met:  Independence with exercise equipment Exercise tolerated well No report of concerns or symptoms today Strength training completed today  Goals Unmet:  Not Applicable  Comments: Pt able to follow exercise prescription today without complaint.  Will continue to monitor for progression.    Dr. Bethann Punches is Medical Director for [facility] Cardiac Rehabilitation.  Dr. Vida Rigger is Medical Director for [facility] Pulmonary Rehabilitation.

## 2023-01-01 ENCOUNTER — Encounter: Payer: Medicare Other | Admitting: *Deleted

## 2023-01-01 DIAGNOSIS — J449 Chronic obstructive pulmonary disease, unspecified: Secondary | ICD-10-CM

## 2023-01-01 DIAGNOSIS — Z5189 Encounter for other specified aftercare: Secondary | ICD-10-CM | POA: Diagnosis not present

## 2023-01-01 NOTE — Progress Notes (Signed)
Daily Session Note  Patient Details  Name: Aniyjah Laurance MRN: [id number] Date of Birth: 1950-03-30 Referring Provider:   Flowsheet Row Pulmonary Rehab from 12/04/2022 in [facility] Cardiac and Pulmonary Rehab  Referring Provider Dr. Inda Coke       Encounter Date: 01/01/2023  Check In:  Session Check In - 01/01/23 1402       Check-In   Supervising physician immediately available to respond to emergencies See telemetry face sheet for immediately available ER MD    Location [facility]-Cardiac & Pulmonary Rehab    Staff Present Ronette Deter, BS, Exercise Physiologist;Joseph Reino Kent, Guinevere Ferrari, RN, ADN;Maxon [employer], , Exercise Physiologist    Virtual Visit No    Medication changes reported     No    Fall or balance concerns reported    No    Warm-up and Cool-down Performed on first and last piece of equipment    Resistance Training Performed Yes    VAD Patient? No    PAD/SET Patient? No      Pain Assessment   Currently in Pain? No/denies                Social History   Tobacco Use  Smoking Status Former   Current packs/day: 0.00   Average packs/day: 1.5 packs/day for 40.0 years (60.0 ttl pk-yrs)   Types: Cigarettes   Start date: 07/1981   Quit date: 07/2021   Years since quitting: 1.4  Smokeless Tobacco Never    Goals Met:  Independence with exercise equipment Exercise tolerated well No report of concerns or symptoms today Strength training completed today  Goals Unmet:  Not Applicable  Comments: Pt able to follow exercise prescription today without complaint.  Will continue to monitor for progression.    Dr. Bethann Punches is Medical Director for [facility] Cardiac Rehabilitation.  Dr. Vida Rigger is Medical Director for [facility] Pulmonary Rehabilitation.

## 2023-01-03 ENCOUNTER — Encounter: Payer: Medicare Other | Admitting: *Deleted

## 2023-01-03 ENCOUNTER — Encounter: Payer: Self-pay | Admitting: *Deleted

## 2023-01-03 DIAGNOSIS — J449 Chronic obstructive pulmonary disease, unspecified: Secondary | ICD-10-CM

## 2023-01-03 DIAGNOSIS — Z5189 Encounter for other specified aftercare: Secondary | ICD-10-CM | POA: Diagnosis not present

## 2023-01-03 NOTE — Progress Notes (Signed)
Pulmonary Individual Treatment Plan  Patient Details  Name: Brianna Briggs MRN: [id number] Date of Birth: June 19, 1950 Referring Provider:   Flowsheet Row Pulmonary Rehab from 12/04/2022 in [facility] Cardiac and Pulmonary Rehab  Referring Provider Dr. Inda Coke       Initial Encounter Date:  Flowsheet Row Pulmonary Rehab from 12/04/2022 in [facility] Cardiac and Pulmonary Rehab  Date 12/04/22       Visit Diagnosis: Chronic obstructive pulmonary disease, unspecified COPD type (HCC)  Patient's Home Medications on Admission:  Current Outpatient Medications:    Cholecalciferol 25 MCG (1000 UT) capsule, Take 2,000 Units by mouth daily., Disp: , Rfl:    gatifloxacin (ZYMAXID) 0.5 % SOLN, Place 1 drop into the left eye 3 (three) times daily., Disp: , Rfl:    guaiFENesin-dextromethorphan (ROBITUSSIN DM) 100-10 MG/5ML syrup, Take 10 mLs by mouth every 4 (four) hours as needed for cough., Disp: 118 mL, Rfl: 0   HYDROcodone-acetaminophen (NORCO/VICODIN) 5-325 MG tablet, Take 1 tablet by mouth every 4 (four) hours as needed for moderate pain. (Patient not taking: Reported on 10/23/2022), Disp: 20 tablet, Rfl: 0   ketoconazole (NIZORAL) 2 % shampoo, Apply 1 Application topically 3 (three) times a week. Wash scalp 3 times a week, let sit 5 minutes and rinse out, for seborrheic dermatitis, Disp: 120 mL, Rfl: 4   ketorolac (ACULAR) 0.5 % ophthalmic solution, Place 1 drop into the left eye 4 (four) times daily., Disp: , Rfl:    minoxidil (LONITEN) 2.5 MG tablet, Take 1/2 tablet once daily (Patient not taking: Reported on 04/24/2022), Disp: 15 tablet, Rfl: 2   mometasone (ELOCON) 0.1 % cream, Apply once daily up to 5 days a week as needed, Disp: 45 g, Rfl: 2   Multiple Vitamin (MULTIVITAMIN) tablet, Take 2 tablets by mouth daily., Disp: , Rfl:    OXYGEN, Inhale 2 L into the lungs at bedtime., Disp: , Rfl:    pantoprazole (PROTONIX) 20 MG tablet, Take by mouth., Disp: , Rfl:    pantoprazole (PROTONIX) 20 MG  tablet, Take 20 mg by mouth daily. (Patient not taking: Reported on 10/23/2022), Disp: , Rfl:    polyethylene glycol-electrolytes (NULYTELY) 420 g solution, See admin instructions., Disp: , Rfl:    prednisoLONE acetate (PRED FORTE) 1 % ophthalmic suspension, Place 1 drop into the left eye 4 (four) times daily., Disp: , Rfl:    tacrolimus (PROTOPIC) 0.1 % ointment, Apply topically as directed. Qd to bid to aa hands  for eczema prn flares, Disp: 100 g, Rfl: 3   traZODone (DESYREL) 50 MG tablet, Take by mouth. (Patient not taking: Reported on 10/23/2022), Disp: , Rfl:    VENTOLIN HFA 108 (90 Base) MCG/ACT inhaler, Inhale 2 puffs into the lungs every evening., Disp: , Rfl:   Past Medical History: Past Medical History:  Diagnosis Date   Acute on chronic respiratory failure with hypoxia (HCC)    Allergic genetic state    Breast cancer, right (HCC) 1996   a.) s/p total mastectomy   Bronchitis    Chronic eczema    COPD (chronic obstructive pulmonary disease) (HCC)    Coronary artery calcification seen on CT scan    Dependence on nocturnal oxygen therapy    a.) 2 L/[place]   Diastolic dysfunction    a.) TTE 02/28/2021: EF 50%, triv MR/TR, G1DD; b.) TEE 07/16/2021: EF 55-60%, G1DD.   Diverticulitis of large intestine with abscess without bleeding    DOE (dyspnea on exertion)    History of 2019 novel coronavirus  disease (COVID-19) 08/2021   History of chicken pox    History of colon polyps    Hyperlipidemia    Hypophosphatemia    Hypotension    MDD (major depressive disorder)    Migraines    Perforated diverticulum of intestine    Peripheral vascular disease with claudication (HCC)    PSVT (paroxysmal supraventricular tachycardia) (HCC)    PVC (premature ventricular contraction)    Severe sepsis with lactic acidosis (HCC)    Single kidney    Squamous cell carcinoma of skin 01/27/2019   Right inferior lateral thigh. KA-type   Thoracic aortic atherosclerosis (HCC)    Tobacco use     Tobacco  Use: Social History   Tobacco Use  Smoking Status Former   Current packs/day: 0.00   Average packs/day: 1.5 packs/day for 40.0 years (60.0 ttl pk-yrs)   Types: Cigarettes   Start date: 07/1981   Quit date: 07/2021   Years since quitting: 1.4  Smokeless Tobacco Never    Labs: Review Flowsheet       Latest Ref Rng & Units 08/21/2021  Labs for ITP Cardiac and Pulmonary Rehab  Bicarbonate 20.0 - 28.0 mmol/L 27.7   O2 Saturation % 85.6      Pulmonary Assessment Scores:  Pulmonary Assessment Scores     Row Name 12/04/22 1623         ADL UCSD   ADL Phase Entry     SOB Score total 63     Rest 0     Walk 3     Stairs 4     Bath 3     Dress 3     Shop 2       CAT Score   CAT Score 33       mMRC Score   mMRC Score 3              UCSD: Self-administered rating of dyspnea associated with activities of daily living (ADLs) 6-point scale (0 = "not at all" to 5 = "maximal or unable to do because of breathlessness")  Scoring Scores range from 0 to 120.  Minimally important difference is 5 units  CAT: CAT can identify the health impairment of COPD patients and is better correlated with disease progression.  CAT has a scoring range of zero to 40. The CAT score is classified into four groups of low (less than 10), medium (10 - 20), high (21-30) and very high (31-40) based on the impact level of disease on health status. A CAT score over 10 suggests significant symptoms.  A worsening CAT score could be explained by an exacerbation, poor medication adherence, poor inhaler technique, or progression of COPD or comorbid conditions.  CAT MCID is 2 points  mMRC: mMRC (Modified Medical Research Council) Dyspnea Scale is used to assess the degree of baseline functional disability in patients of respiratory disease due to dyspnea. No minimal important difference is established. A decrease in score of 1 point or greater is considered a positive change.   Pulmonary Function  Assessment:  Pulmonary Function Assessment - 12/04/22 1625       Pulmonary Function Tests   FVC% 83 %    FEV1% 38 %    FEV1/FVC Ratio 34.64             Exercise Target Goals: Exercise Program Goal: Individual exercise prescription set using results from initial 6 min walk test and THRR while considering  patient's activity barriers and safety.   Exercise Prescription  Goal: Initial exercise prescription builds to 30-45 minutes a day of aerobic activity, 2-3 days per week.  Home exercise guidelines will be given to patient during program as part of exercise prescription that the participant will acknowledge.  Education: Aerobic Exercise: - Group verbal and visual presentation on the components of exercise prescription. Introduces F.I.T.T principle from ACSM for exercise prescriptions.  Reviews F.I.T.T. principles of aerobic exercise including progression. Written material given at graduation.   Education: Resistance Exercise: - Group verbal and visual presentation on the components of exercise prescription. Introduces F.I.T.T principle from ACSM for exercise prescriptions  Reviews F.I.T.T. principles of resistance exercise including progression. Written material given at graduation.    Education: Exercise & Equipment Safety: - Individual verbal instruction and demonstration of equipment use and safety with use of the equipment. Flowsheet Row Pulmonary Rehab from 12/27/2022 in [facility] Cardiac and Pulmonary Rehab  Date 12/04/22  Educator [facility]  Instruction Review Code 1- Verbalizes Understanding       Education: Exercise Physiology & General Exercise Guidelines: - Group verbal and written instruction with models to review the exercise physiology of the cardiovascular system and associated critical values. Provides general exercise guidelines with specific guidelines to those with heart or lung disease.    Education: Flexibility, Balance, Mind/Body Relaxation: - Group verbal and  visual presentation with interactive activity on the components of exercise prescription. Introduces F.I.T.T principle from ACSM for exercise prescriptions. Reviews F.I.T.T. principles of flexibility and balance exercise training including progression. Also discusses the mind body connection.  Reviews various relaxation techniques to help reduce and manage stress (i.e. Deep breathing, progressive muscle relaxation, and visualization). Balance handout provided to take home. Written material given at graduation.   Activity Barriers & Risk Stratification:  Activity Barriers & Cardiac Risk Stratification - 12/04/22 1611       Activity Barriers & Cardiac Risk Stratification   Activity Barriers Other (comment)    Comments lower back pain occationally (no history of injury)             6 Minute Walk:  6 Minute Walk     Row Name 12/04/22 1606         6 Minute Walk   Phase Initial     Distance 790 feet     Walk Time 6 minutes     # of Rest Breaks 0     MPH 1.5     METS 2.4     RPE 13     Perceived Dyspnea  3     VO2 Peak 8.4     Symptoms Yes (comment)     Comments SOB 3     Resting HR 96 bpm     Resting BP 122/82     Resting Oxygen Saturation  90 %     Exercise Oxygen Saturation  during 6 min walk 78 %     Max Ex. HR 144 bpm     Max Ex. BP 150/80     2 Minute Post BP 124/80       Interval HR   1 Minute HR 100     2 Minute HR 122     3 Minute HR 144     4 Minute HR 141     5 Minute HR 140     6 Minute HR 142     2 Minute Post HR 112     Interval Heart Rate? Yes       Interval Oxygen   Interval Oxygen? Yes  Baseline Oxygen Saturation % 90 %     1 Minute Oxygen Saturation % 86 %     1 Minute Liters of Oxygen 0 L     2 Minute Oxygen Saturation % 85 %     2 Minute Liters of Oxygen 0 L     3 Minute Oxygen Saturation % 81 %     3 Minute Liters of Oxygen 0 L     4 Minute Oxygen Saturation % 80 %     4 Minute Liters of Oxygen 0 L     5 Minute Oxygen Saturation % 80 %      5 Minute Liters of Oxygen 0 L     6 Minute Oxygen Saturation % 78 %     6 Minute Liters of Oxygen 0 L     2 Minute Post Oxygen Saturation % 90 %     2 Minute Post Liters of Oxygen 0 L             Oxygen Initial Assessment:  Oxygen Initial Assessment - 12/04/22 1625       Home Oxygen   Home Oxygen Device Home Concentrator;E-Tanks;Portable Concentrator    Sleep Oxygen Prescription Continuous    Liters per minute 2    Home Exercise Oxygen Prescription Pulsed   as needed   Liters per minute 2    Home Resting Oxygen Prescription Pulsed   as needed   Liters per minute 2    Compliance with Home Oxygen Use Yes   Patient uses O2 at night. She reports that she does not use her portable consentrator but she was encouraged to monitor her SaO2 at home and use her pulsed O2 if her SaO2 drops below 88% during the day b/c her SaO2 dropped significantly during her .     Initial 6 min Walk   Oxygen Used None      Program Oxygen Prescription   Program Oxygen Prescription Continuous    Liters per minute 2    Comments Patient's SaO2 dropped significantly during her . Lowest reading was 78%. Will use continuous O2 as needed up to 2 L to maintain SaO2 >88% during exercise.      Intervention   Short Term Goals To learn and exhibit compliance with exercise, home and travel O2 prescription;To learn and understand importance of monitoring SPO2 with pulse oximeter and demonstrate accurate use of the pulse oximeter.;To learn and understand importance of maintaining oxygen saturations>88%;To learn and demonstrate proper pursed lip breathing techniques or other breathing techniques. ;To learn and demonstrate proper use of respiratory medications    Long  Term Goals Exhibits compliance with exercise, home  and travel O2 prescription;Verbalizes importance of monitoring SPO2 with pulse oximeter and return demonstration;Maintenance of O2 saturations>88%;Exhibits proper breathing techniques, such as  pursed lip breathing or other method taught during program session;Compliance with respiratory medication;Demonstrates proper use of MDI's             Oxygen Re-Evaluation:  Oxygen Re-Evaluation     Row Name 12/21/22 1408             Program Oxygen Prescription   Program Oxygen Prescription Continuous       Liters per minute 2         Home Oxygen   Home Oxygen Device Home Concentrator;E-Tanks;Portable Concentrator       Sleep Oxygen Prescription Continuous       Liters per minute 2       Home Exercise Oxygen  Prescription Pulsed  PRN       Liters per minute 2       Home Resting Oxygen Prescription Pulsed       Liters per minute 2       Compliance with Home Oxygen Use Yes         Goals/Expected Outcomes   Short Term Goals To learn and demonstrate proper pursed lip breathing techniques or other breathing techniques.        Long  Term Goals Exhibits proper breathing techniques, such as pursed lip breathing or other method taught during program session       Comments Informed patient how to perform the Pursed Lipped breathing technique. Told patient to Inhale through the nose and out the mouth with pursed lips to keep their airways open, help oxygenate them better, practice when at rest or doing strenuous activity. Patient Verbalizes understanding of technique and will work on and be reiterated during [facility].       Goals/Expected Outcomes Short: use PLB with exertion. Long: use PLB on exertion proficiently and independently.                Oxygen Discharge (Final Oxygen Re-Evaluation):  Oxygen Re-Evaluation - 12/21/22 1408       Program Oxygen Prescription   Program Oxygen Prescription Continuous    Liters per minute 2      Home Oxygen   Home Oxygen Device Home Concentrator;E-Tanks;Portable Concentrator    Sleep Oxygen Prescription Continuous    Liters per minute 2    Home Exercise Oxygen Prescription Pulsed   PRN   Liters per minute 2    Home Resting Oxygen  Prescription Pulsed    Liters per minute 2    Compliance with Home Oxygen Use Yes      Goals/Expected Outcomes   Short Term Goals To learn and demonstrate proper pursed lip breathing techniques or other breathing techniques.     Long  Term Goals Exhibits proper breathing techniques, such as pursed lip breathing or other method taught during program session    Comments Informed patient how to perform the Pursed Lipped breathing technique. Told patient to Inhale through the nose and out the mouth with pursed lips to keep their airways open, help oxygenate them better, practice when at rest or doing strenuous activity. Patient Verbalizes understanding of technique and will work on and be reiterated during [facility].    Goals/Expected Outcomes Short: use PLB with exertion. Long: use PLB on exertion proficiently and independently.             Initial Exercise Prescription:  Initial Exercise Prescription - 12/04/22 1600       Date of Initial Exercise RX and Referring Provider   Date 12/04/22    Referring Provider Dr. Inda Coke      Oxygen   Oxygen Continuous    Liters 2   use supplemental O2 as needed up to 2 L to maintain SaO2 >88%   Maintain Oxygen Saturation 88% or higher      Treadmill   MPH 1.3    Grade 1    Minutes 15    METs 2.17      NuStep   Level 1   T6   SPM 80    Minutes 15    METs 2.4      REL-XR   Level 1    Speed 50    Minutes 15    METs 2.4      Biostep-RELP  Level 1    SPM 50    Minutes 15    METs 2.4      Prescription Details   Frequency (times per week) 3    Duration Progress to 30 minutes of continuous aerobic without signs/symptoms of physical distress      Intensity   THRR 40-80% of Max Heartrate 116-140    Ratings of Perceived Exertion 11-13    Perceived Dyspnea 0-4      Progression   Progression Continue to progress workloads to maintain intensity without signs/symptoms of physical distress.      Resistance Training   Training  Prescription Yes    Weight 1    Reps 10-15             Perform Capillary Blood Glucose checks as needed.  Exercise Prescription Changes:   Exercise Prescription Changes     Row Name 12/04/22 1600 12/28/22 1600           Response to Exercise   Blood Pressure (Admit) 122/82 128/72      Blood Pressure (Exercise) 150/80 138/64      Blood Pressure (Exit) 124/80 112/60      Heart Rate (Admit) 96 bpm 92 bpm      Heart Rate (Exercise) 144 bpm 130 bpm      Heart Rate (Exit) 98 bpm 106 bpm      Oxygen Saturation (Admit) 90 % 90 %      Oxygen Saturation (Exercise) 78 % 88 %      Oxygen Saturation (Exit) 90 % 92 %      Rating of Perceived Exertion (Exercise) 13 15      Perceived Dyspnea (Exercise) 3 2      Symptoms SOB 3 3      Comments 6 MWT results First three days of exercise      Duration -- Progress to 30 minutes of  aerobic without signs/symptoms of physical distress      Intensity -- THRR unchanged        Progression   Progression -- Continue to progress workloads to maintain intensity without signs/symptoms of physical distress.      Average METs -- 1.98        Resistance Training   Training Prescription -- Yes      Weight -- 2 lb      Reps -- 10-15        Interval Training   Interval Training -- No        Oxygen   Oxygen -- Continuous      Liters -- 2  use supplemental O2 as needed up to 2 L to maintain SaO2 >88%        Treadmill   MPH -- 1.3      Grade -- 0      Minutes -- 15      METs -- 1.99        REL-XR   Level -- 1      Minutes -- 15        Biostep-RELP   Level -- 1      Minutes -- 15      METs -- 2        Oxygen   Maintain Oxygen Saturation -- 88% or higher               Exercise Comments:   Exercise Comments     Row Name 12/18/22 1350           Exercise  Comments First full day of exercise!  Patient was oriented to gym and equipment including functions, settings, policies, and procedures.  Patient's individual exercise  prescription and treatment plan were reviewed.  All starting workloads were established based on the results of the 6 minute walk test done at initial orientation visit.  The plan for exercise progression was also introduced and progression will be customized based on patient's performance and goals.                Exercise Goals and Review:   Exercise Goals     Row Name 12/04/22 1619             Exercise Goals   Increase Physical Activity Yes       Intervention Provide advice, education, support and counseling about physical activity/exercise needs.;Develop an individualized exercise prescription for aerobic and resistive training based on initial evaluation findings, risk stratification, comorbidities and participant's personal goals.       Expected Outcomes Short Term: Attend rehab on a regular basis to increase amount of physical activity.;Long Term: Add in home exercise to make exercise part of routine and to increase amount of physical activity.;Long Term: Exercising regularly at least 3-5 days a week.       Increase Strength and Stamina Yes       Intervention Provide advice, education, support and counseling about physical activity/exercise needs.;Develop an individualized exercise prescription for aerobic and resistive training based on initial evaluation findings, risk stratification, comorbidities and participant's personal goals.       Expected Outcomes Short Term: Increase workloads from initial exercise prescription for resistance, speed, and METs.;Short Term: Perform resistance training exercises routinely during rehab and add in resistance training at home;Long Term: Improve cardiorespiratory fitness, muscular endurance and strength as measured by increased METs and functional capacity ( )       Able to understand and use rate of perceived exertion (RPE) scale Yes       Intervention Provide education and explanation on how to use RPE scale       Expected Outcomes Short  Term: Able to use RPE daily in rehab to express subjective intensity level;Long Term:  Able to use RPE to guide intensity level when exercising independently       Able to understand and use Dyspnea scale Yes       Intervention Provide education and explanation on how to use Dyspnea scale       Expected Outcomes Short Term: Able to use Dyspnea scale daily in rehab to express subjective sense of shortness of breath during exertion;Long Term: Able to use Dyspnea scale to guide intensity level when exercising independently       Knowledge and understanding of Target Heart Rate Range (THRR) Yes       Intervention Provide education and explanation of THRR including how the numbers were predicted and where they are located for reference       Expected Outcomes Short Term: Able to state/look up THRR;Long Term: Able to use THRR to govern intensity when exercising independently;Short Term: Able to use daily as guideline for intensity in rehab       Able to check pulse independently Yes       Intervention Provide education and demonstration on how to check pulse in carotid and radial arteries.;Review the importance of being able to check your own pulse for safety during independent exercise       Expected Outcomes Short Term: Able to explain why pulse checking is important  during independent exercise;Long Term: Able to check pulse independently and accurately       Understanding of Exercise Prescription Yes       Intervention Provide education, explanation, and written materials on patient's individual exercise prescription       Expected Outcomes Short Term: Able to explain program exercise prescription;Long Term: Able to explain home exercise prescription to exercise independently                Exercise Goals Re-Evaluation :  Exercise Goals Re-Evaluation     Row Name 12/18/22 1351 12/28/22 1629           Exercise Goal Re-Evaluation   Exercise Goals Review Increase Physical Activity;Able to  understand and use rate of perceived exertion (RPE) scale;Knowledge and understanding of Target Heart Rate Range (THRR);Understanding of Exercise Prescription;Increase Strength and Stamina;Able to check pulse independently;Able to understand and use Dyspnea scale Increase Physical Activity;Increase Strength and Stamina;Understanding of Exercise Prescription      Comments Reviewed RPE and dyspnea scale, THR and program prescription with pt today.  Pt voiced understanding and was given a copy of goals to take home. Nyarie is off to a good start in the program. She did well on the treadmill at a speed of 1.3 mph with no incline. She also did well working at level 1 on both the XR and biostep. She was able to use 2 lb hand weights for resistance training as well. We will continue to monitor her progress in the program.      Expected Outcomes Short: Use RPE daily to regulate intensity.  Long: Follow program prescription in THR. Short: Continue to follow current exercise prescription. Long: Continue exercise to improve strength and stamina.               Discharge Exercise Prescription (Final Exercise Prescription Changes):  Exercise Prescription Changes - 12/28/22 1600       Response to Exercise   Blood Pressure (Admit) 128/72    Blood Pressure (Exercise) 138/64    Blood Pressure (Exit) 112/60    Heart Rate (Admit) 92 bpm    Heart Rate (Exercise) 130 bpm    Heart Rate (Exit) 106 bpm    Oxygen Saturation (Admit) 90 %    Oxygen Saturation (Exercise) 88 %    Oxygen Saturation (Exit) 92 %    Rating of Perceived Exertion (Exercise) 15    Perceived Dyspnea (Exercise) 2    Symptoms 3    Comments First three days of exercise    Duration Progress to 30 minutes of  aerobic without signs/symptoms of physical distress    Intensity THRR unchanged      Progression   Progression Continue to progress workloads to maintain intensity without signs/symptoms of physical distress.    Average METs 1.98       Resistance Training   Training Prescription Yes    Weight 2 lb    Reps 10-15      Interval Training   Interval Training No      Oxygen   Oxygen Continuous    Liters 2   use supplemental O2 as needed up to 2 L to maintain SaO2 >88%     Treadmill   MPH 1.3    Grade 0    Minutes 15    METs 1.99      REL-XR   Level 1    Minutes 15      Biostep-RELP   Level 1    Minutes 15  METs 2      Oxygen   Maintain Oxygen Saturation 88% or higher             Nutrition:  Target Goals: Understanding of nutrition guidelines, daily intake of sodium 1500mg , cholesterol 200mg , calories 30% from fat and 7% or less from saturated fats, daily to have 5 or more servings of fruits and vegetables.  Education: All About Nutrition: -Group instruction provided by verbal, written material, interactive activities, discussions, models, and posters to present general guidelines for heart healthy nutrition including fat, fiber, MyPlate, the role of sodium in heart healthy nutrition, utilization of the nutrition label, and utilization of this knowledge for meal planning. Follow up email sent as well. Written material given at graduation.   Biometrics:  Pre Biometrics - 12/04/22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

## 2023-01-03 NOTE — Progress Notes (Signed)
Daily Session Note  Patient Details  Name: Brianna Briggs MRN: [id number] Date of Birth: 16-Jul-1950 Referring Provider:   Flowsheet Row Pulmonary Rehab from 12/04/2022 in [facility] Cardiac and Pulmonary Rehab  Referring Provider Dr. Inda Coke       Encounter Date: 01/03/2023  Check In:  Session Check In - 01/03/23 1403       Check-In   Supervising physician immediately available to respond to emergencies See telemetry face sheet for immediately available ER MD    Location [facility]-Cardiac & Pulmonary Rehab    Staff Present Tommye Standard, BS, ACSM CEP, Exercise Physiologist;Davon Folta Jewel Baize, RN BSN;Noah Tickle, BS, Exercise Physiologist;Jason Wallace Cullens, RDN, LDN    Virtual Visit No    Medication changes reported     No    Fall or balance concerns reported    No    Warm-up and Cool-down Performed on first and last piece of equipment    Resistance Training Performed Yes    VAD Patient? No    PAD/SET Patient? No      Pain Assessment   Currently in Pain? No/denies                Social History   Tobacco Use  Smoking Status Former   Current packs/day: 0.00   Average packs/day: 1.5 packs/day for 40.0 years (60.0 ttl pk-yrs)   Types: Cigarettes   Start date: 07/1981   Quit date: 07/2021   Years since quitting: 1.4  Smokeless Tobacco Never    Goals Met:  Independence with exercise equipment Exercise tolerated well No report of concerns or symptoms today Strength training completed today  Goals Unmet:  Not Applicable  Comments: Pt able to follow exercise prescription today without complaint.  Will continue to monitor for progression.    Dr. Bethann Punches is Medical Director for [facility] Cardiac Rehabilitation.  Dr. Vida Rigger is Medical Director for [facility] Pulmonary Rehabilitation.

## 2023-01-04 ENCOUNTER — Encounter: Payer: Medicare Other | Admitting: *Deleted

## 2023-01-04 DIAGNOSIS — Z5189 Encounter for other specified aftercare: Secondary | ICD-10-CM | POA: Diagnosis not present

## 2023-01-04 DIAGNOSIS — J449 Chronic obstructive pulmonary disease, unspecified: Secondary | ICD-10-CM

## 2023-01-04 NOTE — Progress Notes (Signed)
Daily Session Note  Patient Details  Name: Brianna Briggs MRN: [id number] Date of Birth: 1950/12/12 Referring Provider:   Flowsheet Row Pulmonary Rehab from 12/04/2022 in [facility] Cardiac and Pulmonary Rehab  Referring Provider Dr. Inda Coke       Encounter Date: 01/04/2023  Check In:  Session Check In - 01/04/23 1139       Check-In   Supervising physician immediately available to respond to emergencies See telemetry face sheet for immediately available ER MD    Location [facility]-Cardiac & Pulmonary Rehab    Staff Present Ronette Deter, BS, Exercise Physiologist;Joseph [place], RCP,RRT,BSRT;Meredith Stanley, RN Atilano Median, RN, ADN    Virtual Visit No    Medication changes reported     No    Fall or balance concerns reported    No    Warm-up and Cool-down Performed on first and last piece of equipment    Resistance Training Performed Yes    VAD Patient? No    PAD/SET Patient? No      Pain Assessment   Currently in Pain? No/denies                Social History   Tobacco Use  Smoking Status Former   Current packs/day: 0.00   Average packs/day: 1.5 packs/day for 40.0 years (60.0 ttl pk-yrs)   Types: Cigarettes   Start date: 07/1981   Quit date: 07/2021   Years since quitting: 1.4  Smokeless Tobacco Never    Goals Met:  Independence with exercise equipment Exercise tolerated well No report of concerns or symptoms today Strength training completed today  Goals Unmet:  Not Applicable  Comments: Pt able to follow exercise prescription today without complaint.  Will continue to monitor for progression.    Dr. Bethann Punches is Medical Director for [facility] Cardiac Rehabilitation.  Dr. Vida Rigger is Medical Director for [facility] Pulmonary Rehabilitation.

## 2023-01-08 ENCOUNTER — Encounter: Payer: Medicare Other | Admitting: *Deleted

## 2023-01-08 DIAGNOSIS — Z5189 Encounter for other specified aftercare: Secondary | ICD-10-CM | POA: Diagnosis not present

## 2023-01-08 DIAGNOSIS — J449 Chronic obstructive pulmonary disease, unspecified: Secondary | ICD-10-CM

## 2023-01-08 NOTE — Progress Notes (Signed)
Daily Session Note  Patient Details  Name: Evan Verney MRN: [id number] Date of Birth: [birth year] Referring Provider:   Flowsheet Row Pulmonary Rehab from 12/04/2022 in [facility] Cardiac and Pulmonary Rehab  Referring Provider Dr. Inda Coke       Encounter Date: 01/08/2023  Check In:  Session Check In - 01/08/23 1409       Check-In   Supervising physician immediately available to respond to emergencies See telemetry face sheet for immediately available ER MD    Location [facility]-Cardiac & Pulmonary Rehab    Staff Present Susann Givens, RN BSN;Joseph Reino Kent, RCP,RRT,BSRT;Kristen Coble, RN,BC,MSN;Noah Onton, [place], Exercise Physiologist    Virtual Visit No    Medication changes reported     No    Fall or balance concerns reported    No    Warm-up and Cool-down Performed on first and last piece of equipment    Resistance Training Performed Yes    VAD Patient? No    PAD/SET Patient? No      Pain Assessment   Currently in Pain? No/denies                Social History   Tobacco Use  Smoking Status Former   Current packs/day: 0.00   Average packs/day: 1.5 packs/day for 40.0 years (60.0 ttl pk-yrs)   Types: Cigarettes   Start date: 07/1981   Quit date: 07/2021   Years since quitting: 1.4  Smokeless Tobacco Never    Goals Met:  Independence with exercise equipment Exercise tolerated well No report of concerns or symptoms today Strength training completed today  Goals Unmet:  Not Applicable  Comments: Pt able to follow exercise prescription today without complaint.  Will continue to monitor for progression.    Dr. Bethann Punches is Medical Director for [facility] Cardiac Rehabilitation.  Dr. Vida Rigger is Medical Director for [facility] Pulmonary Rehabilitation.

## 2023-01-09 ENCOUNTER — Encounter: Payer: Medicare Other | Admitting: *Deleted

## 2023-01-09 DIAGNOSIS — J449 Chronic obstructive pulmonary disease, unspecified: Secondary | ICD-10-CM

## 2023-01-09 DIAGNOSIS — Z5189 Encounter for other specified aftercare: Secondary | ICD-10-CM | POA: Diagnosis not present

## 2023-01-09 NOTE — Progress Notes (Signed)
Daily Session Note  Patient Details  Name: Brianna Briggs MRN: [id number] Date of Birth: 1950/10/20 Referring Provider:   Flowsheet Row Pulmonary Rehab from 12/04/2022 in [facility] Cardiac and Pulmonary Rehab  Referring Provider Dr. Inda Coke       Encounter Date: 01/09/2023  Check In:  Session Check In - 01/09/23 1122       Check-In   Supervising physician immediately available to respond to emergencies See telemetry face sheet for immediately available ER MD    Location [facility]-Cardiac & Pulmonary Rehab    Staff Present Cora Collum, RN, BSN, CCRP;Kristen Coble, RN,BC,MSN;Margaret Best, MS, Exercise Physiologist;Noah Tickle, BS, Exercise Physiologist;Jason Wallace Cullens, RDN, LDN    Virtual Visit No    Medication changes reported     No    Fall or balance concerns reported    No    Resistance Training Performed No    VAD Patient? No    PAD/SET Patient? No      Pain Assessment   Currently in Pain? No/denies                Social History   Tobacco Use  Smoking Status Former   Current packs/day: 0.00   Average packs/day: 1.5 packs/day for 40.0 years (60.0 ttl pk-yrs)   Types: Cigarettes   Start date: 07/1981   Quit date: 07/2021   Years since quitting: 1.4  Smokeless Tobacco Never    Goals Met:  Proper associated with RPD/PD & O2 Sat Independence with exercise equipment Exercise tolerated well No report of concerns or symptoms today  Goals Unmet:  Not Applicable  Comments: Pt able to follow exercise prescription today without complaint.  Will continue to monitor for progression.    Dr. Bethann Punches is Medical Director for [facility] Cardiac Rehabilitation.  Dr. Vida Rigger is Medical Director for [facility] Pulmonary Rehabilitation.

## 2023-01-15 ENCOUNTER — Encounter: Payer: Medicare Other | Admitting: *Deleted

## 2023-01-15 DIAGNOSIS — Z5189 Encounter for other specified aftercare: Secondary | ICD-10-CM | POA: Diagnosis not present

## 2023-01-15 DIAGNOSIS — J449 Chronic obstructive pulmonary disease, unspecified: Secondary | ICD-10-CM

## 2023-01-15 NOTE — Progress Notes (Signed)
Daily Session Note  Patient Details  Name: Brianna Briggs MRN: [id number] Date of Birth: 04/25/1950 Referring Provider:   Flowsheet Row Pulmonary Rehab from 12/04/2022 in [facility] Cardiac and Pulmonary Rehab  Referring Provider Dr. Inda Coke       Encounter Date: 01/15/2023  Check In:  Session Check In - 01/15/23 1353       Check-In   Supervising physician immediately available to respond to emergencies See telemetry face sheet for immediately available ER MD    Location [facility]-Cardiac & Pulmonary Rehab    Staff Present Susann Givens, RN BSN;Joseph Reino Kent, RCP,RRT,BSRT;Laureen Manson [place], [place], RRT, CPFT;Maxon Conetta BS, , Exercise Physiologist    Virtual Visit No    Medication changes reported     No    Fall or balance concerns reported    No    Warm-up and Cool-down Performed on first and last piece of equipment    Resistance Training Performed Yes    VAD Patient? No    PAD/SET Patient? No      Pain Assessment   Currently in Pain? No/denies                Social History   Tobacco Use  Smoking Status Former   Current packs/day: 0.00   Average packs/day: 1.5 packs/day for 40.0 years (60.0 ttl pk-yrs)   Types: Cigarettes   Start date: 07/1981   Quit date: 07/2021   Years since quitting: 1.5  Smokeless Tobacco Never    Goals Met:  Independence with exercise equipment Exercise tolerated well No report of concerns or symptoms today Strength training completed today  Goals Unmet:  Not Applicable  Comments: Pt able to follow exercise prescription today without complaint.  Will continue to monitor for progression.    Dr. Bethann Punches is Medical Director for [facility] Cardiac Rehabilitation.  Dr. Vida Rigger is Medical Director for [facility] Pulmonary Rehabilitation.

## 2023-01-18 ENCOUNTER — Encounter: Payer: Medicare Other | Attending: Pulmonary Disease | Admitting: *Deleted

## 2023-01-18 DIAGNOSIS — J449 Chronic obstructive pulmonary disease, unspecified: Secondary | ICD-10-CM | POA: Diagnosis present

## 2023-01-18 NOTE — Progress Notes (Signed)
 Daily Session Note  Patient Details  Name: Brianna Briggs MRN: [id number] Date of Birth: 07-07-1950 Referring Provider:   Flowsheet Row Pulmonary Rehab from 12/04/2022 in [facility] Cardiac and Pulmonary Rehab  Referring Provider Dr. Juanita Rob       Encounter Date: 01/18/2023  Check In:  Session Check In - 01/18/23 1358       Check-In   Supervising physician immediately available to respond to emergencies See telemetry face sheet for immediately available ER MD    Location [facility]-Cardiac & Pulmonary Rehab    Staff Present Othel Durand, RN, BSN, CCRP;Meredith Tressa, RN BSN;Maxon Conetta BS, , Exercise Physiologist    Virtual Visit No    Medication changes reported     No    Fall or balance concerns reported    No    Resistance Training Performed Yes    VAD Patient? No    PAD/SET Patient? No      Pain Assessment   Currently in Pain? No/denies                Social History   Tobacco Use  Smoking Status Former   Current packs/day: 0.00   Average packs/day: 1.5 packs/day for 40.0 years (60.0 ttl pk-yrs)   Types: Cigarettes   Start date: 07/1981   Quit date: 07/2021   Years since quitting: 1.5  Smokeless Tobacco Never    Goals Met:  Independence with exercise equipment Exercise tolerated well No report of concerns or symptoms today Strength training completed today  Goals Unmet:  Not Applicable  Comments: Pt able to follow exercise prescription today without complaint.  Will continue to monitor for progression.    Dr. Oneil Pinal is Medical Director for [facility] Cardiac Rehabilitation.  Dr. Fuad Aleskerov is Medical Director for [facility] Pulmonary Rehabilitation.

## 2023-01-22 ENCOUNTER — Encounter: Payer: Medicare Other | Admitting: *Deleted

## 2023-01-22 DIAGNOSIS — J449 Chronic obstructive pulmonary disease, unspecified: Secondary | ICD-10-CM | POA: Diagnosis not present

## 2023-01-22 NOTE — Progress Notes (Signed)
 Daily Session Note  Patient Details  Name: Brianna Briggs MRN: [id number] Date of Birth: 1950-05-03 Referring Provider:   Flowsheet Row Pulmonary Rehab from 12/04/2022 in [facility] Cardiac and Pulmonary Rehab  Referring Provider Dr. Juanita Rob       Encounter Date: 01/22/2023  Check In:  Session Check In - 01/22/23 1342       Check-In   Supervising physician immediately available to respond to emergencies See telemetry face sheet for immediately available ER MD    Location [facility]-Cardiac & Pulmonary Rehab    Staff Present Hoy Rodney, RN BSN;Joseph Chaires, RCP,RRT,BSRT;Noah Tickle, [place], Exercise Physiologist;Maxon [place] BS, , Exercise Physiologist    Virtual Visit No    Medication changes reported     No    Fall or balance concerns reported    No    Warm-up and Cool-down Performed on first and last piece of equipment    Resistance Training Performed Yes    VAD Patient? No    PAD/SET Patient? No      Pain Assessment   Currently in Pain? No/denies                Social History   Tobacco Use  Smoking Status Former   Current packs/day: 0.00   Average packs/day: 1.5 packs/day for 40.0 years (60.0 ttl pk-yrs)   Types: Cigarettes   Start date: 07/1981   Quit date: 07/2021   Years since quitting: 1.5  Smokeless Tobacco Never    Goals Met:  Independence with exercise equipment Exercise tolerated well No report of concerns or symptoms today Strength training completed today  Goals Unmet:  Not Applicable  Comments: Pt able to follow exercise prescription today without complaint.  Will continue to monitor for progression.    Dr. Oneil Pinal is Medical Director for [facility] Cardiac Rehabilitation.  Dr. Fuad Aleskerov is Medical Director for [facility] Pulmonary Rehabilitation.

## 2023-01-24 ENCOUNTER — Encounter: Payer: Medicare Other | Admitting: *Deleted

## 2023-01-24 DIAGNOSIS — J449 Chronic obstructive pulmonary disease, unspecified: Secondary | ICD-10-CM

## 2023-01-24 NOTE — Progress Notes (Signed)
 Daily Session Note  Patient Details  Name: Brianna Briggs MRN: [id number] Date of Birth: 12/29/1950 Referring Provider:   Flowsheet Row Pulmonary Rehab from 12/04/2022 in [facility] Cardiac and Pulmonary Rehab  Referring Provider Dr. Juanita Rob       Encounter Date: 01/24/2023  Check In:  Session Check In - 01/24/23 1344       Check-In   Supervising physician immediately available to respond to emergencies See telemetry face sheet for immediately available ER MD    Location [facility]-Cardiac & Pulmonary Rehab    Staff Present Hoy Rodney, RN Monico Hint, BS, ACSM CEP, Exercise Physiologist;Megan Claudene, RN, ADN;Susanne Bice, RN, BSN, CCRP    Virtual Visit No    Medication changes reported     No    Fall or balance concerns reported    No    Warm-up and Cool-down Performed on first and last piece of equipment    Resistance Training Performed Yes    VAD Patient? No    PAD/SET Patient? No      Pain Assessment   Currently in Pain? No/denies                Social History   Tobacco Use  Smoking Status Former   Current packs/day: 0.00   Average packs/day: 1.5 packs/day for 40.0 years (60.0 ttl pk-yrs)   Types: Cigarettes   Start date: 07/1981   Quit date: 07/2021   Years since quitting: 1.5  Smokeless Tobacco Never    Goals Met:  Independence with exercise equipment Exercise tolerated well No report of concerns or symptoms today Strength training completed today  Goals Unmet:  Not Applicable  Comments: Pt able to follow exercise prescription today without complaint.  Will continue to monitor for progression.    Dr. Oneil Pinal is Medical Director for [facility] Cardiac Rehabilitation.  Dr. Fuad Aleskerov is Medical Director for [facility] Pulmonary Rehabilitation.

## 2023-01-25 ENCOUNTER — Encounter: Payer: Medicare Other | Admitting: *Deleted

## 2023-01-25 DIAGNOSIS — J449 Chronic obstructive pulmonary disease, unspecified: Secondary | ICD-10-CM

## 2023-01-25 NOTE — Progress Notes (Signed)
 Daily Session Note  Patient Details  Name: Brianna Briggs MRN: [id number] Date of Birth: 04/11/1950 Referring Provider:   Flowsheet Row Pulmonary Rehab from 12/04/2022 in [facility] Cardiac and Pulmonary Rehab  Referring Provider Dr. Juanita Rob       Encounter Date: 01/25/2023  Check In:  Session Check In - 01/25/23 1403       Check-In   Supervising physician immediately available to respond to emergencies See telemetry face sheet for immediately available ER MD    Location [facility]-Cardiac & Pulmonary Rehab    Staff Present Devaughn Jaeger, BS, Exercise Physiologist;Joseph Pringle, RCP,RRT,BSRT;Meredith [place], RN Roxan Sharps, RN, ADN    Virtual Visit No    Medication changes reported     No    Fall or balance concerns reported    No    Warm-up and Cool-down Performed on first and last piece of equipment    Resistance Training Performed Yes    VAD Patient? No    PAD/SET Patient? No      Pain Assessment   Currently in Pain? No/denies                Social History   Tobacco Use  Smoking Status Former   Current packs/day: 0.00   Average packs/day: 1.5 packs/day for 40.0 years (60.0 ttl pk-yrs)   Types: Cigarettes   Start date: 07/1981   Quit date: 07/2021   Years since quitting: 1.5  Smokeless Tobacco Never    Goals Met:  Independence with exercise equipment Exercise tolerated well No report of concerns or symptoms today Strength training completed today  Goals Unmet:  Not Applicable  Comments: Pt able to follow exercise prescription today without complaint.  Will continue to monitor for progression.    Dr. Oneil Pinal is Medical Director for [facility] Cardiac Rehabilitation.  Dr. Fuad Aleskerov is Medical Director for [facility] Pulmonary Rehabilitation.

## 2023-01-29 ENCOUNTER — Encounter: Payer: Medicare Other | Admitting: *Deleted

## 2023-01-29 DIAGNOSIS — J449 Chronic obstructive pulmonary disease, unspecified: Secondary | ICD-10-CM | POA: Diagnosis not present

## 2023-01-29 NOTE — Progress Notes (Signed)
 Daily Session Note  Patient Details  Name: Brianna Briggs MRN: [id number] Date of Birth: September 02, 1950 Referring Provider:   Flowsheet Row Pulmonary Rehab from 12/04/2022 in [facility] Cardiac and Pulmonary Rehab  Referring Provider Dr. Juanita Rob       Encounter Date: 01/29/2023  Check In:  Session Check In - 01/29/23 1354       Check-In   Supervising physician immediately available to respond to emergencies See telemetry face sheet for immediately available ER MD    Location [facility]-Cardiac & Pulmonary Rehab    Staff Present Hoy Rodney, RN BSN;Maxon Conetta BS, , Exercise Physiologist;Laureen Delores, BS, RRT, CPFT;Joseph Palmer, [place]    Virtual Visit No    Medication changes reported     No    Fall or balance concerns reported    No    Warm-up and Cool-down Performed on first and last piece of equipment    Resistance Training Performed Yes    VAD Patient? No    PAD/SET Patient? No      Pain Assessment   Currently in Pain? No/denies                Social History   Tobacco Use  Smoking Status Former   Current packs/day: 0.00   Average packs/day: 1.5 packs/day for 40.0 years (60.0 ttl pk-yrs)   Types: Cigarettes   Start date: 07/1981   Quit date: 07/2021   Years since quitting: 1.5  Smokeless Tobacco Never    Goals Met:  Independence with exercise equipment Exercise tolerated well No report of concerns or symptoms today Strength training completed today  Goals Unmet:  Not Applicable  Comments: Pt able to follow exercise prescription today without complaint.  Will continue to monitor for progression.    Dr. Oneil Pinal is Medical Director for [facility] Cardiac Rehabilitation.  Dr. Fuad Aleskerov is Medical Director for [facility] Pulmonary Rehabilitation.

## 2023-01-31 ENCOUNTER — Encounter: Payer: Self-pay | Admitting: *Deleted

## 2023-01-31 ENCOUNTER — Encounter: Payer: Medicare Other | Admitting: *Deleted

## 2023-01-31 DIAGNOSIS — J449 Chronic obstructive pulmonary disease, unspecified: Secondary | ICD-10-CM

## 2023-01-31 NOTE — Progress Notes (Signed)
 Daily Session Note  Patient Details  Name: Brianna Briggs MRN: [id number] Date of Birth: February 06, 1950 Referring Provider:   Flowsheet Row Pulmonary Rehab from 12/04/2022 in [facility] Cardiac and Pulmonary Rehab  Referring Provider Dr. Florentina Huntsman       Encounter Date: 01/31/2023  Check In:  Session Check In - 01/31/23 1406       Check-In   Supervising physician immediately available to respond to emergencies See telemetry face sheet for immediately available ER MD    Location [facility]-Cardiac & Pulmonary Rehab    Staff Present Balinda Level, BS, RRT, CPFT;Mayley Lish Manson Seitz RN,BSN;Joseph [facility] BS, Exercise Physiologist    Virtual Visit No    Medication changes reported     No    Fall or balance concerns reported    No    Warm-up and Cool-down Performed on first and last piece of equipment    Resistance Training Performed Yes    VAD Patient? No    PAD/SET Patient? No      Pain Assessment   Currently in Pain? No/denies                Social History   Tobacco Use  Smoking Status Former   Current packs/day: 0.00   Average packs/day: 1.5 packs/day for 40.0 years (60.0 ttl pk-yrs)   Types: Cigarettes   Start date: 07/1981   Quit date: 07/2021   Years since quitting: 1.5  Smokeless Tobacco Never    Goals Met:  Independence with exercise equipment Exercise tolerated well No report of concerns or symptoms today Strength training completed today  Goals Unmet:  Not Applicable  Comments: Pt able to follow exercise prescription today without complaint.  Will continue to monitor for progression.    Dr. Firman Hughes is Medical Director for [facility] Cardiac Rehabilitation.  Dr. Fuad Aleskerov is Medical Director for [facility] Pulmonary Rehabilitation.

## 2023-01-31 NOTE — Progress Notes (Signed)
 Pulmonary Individual Treatment Plan  Patient Details  Name: Brianna Briggs MRN: [id number] Date of Birth: [birth year] Referring Provider:   Flowsheet Row Pulmonary Rehab from 12/04/2022 in [facility] Cardiac and Pulmonary Rehab  Referring Provider Dr. Florentina Huntsman       Initial Encounter Date:  Flowsheet Row Pulmonary Rehab from 12/04/2022 in [facility] Cardiac and Pulmonary Rehab  Date 12/04/22       Visit Diagnosis: Chronic obstructive pulmonary disease, unspecified COPD type (HCC)  Patient's Home Medications on Admission:  Current Outpatient Medications:    Cholecalciferol  25 MCG (1000 UT) capsule, Take 2,000 Units by mouth daily., Disp: , Rfl:    gatifloxacin (ZYMAXID) 0.5 % SOLN, Place 1 drop into the left eye 3 (three) times daily., Disp: , Rfl:    guaiFENesin -dextromethorphan  (ROBITUSSIN DM) 100-10 MG/5ML syrup, Take 10 mLs by mouth every 4 (four) hours as needed for cough., Disp: 118 mL, Rfl: 0   HYDROcodone -acetaminophen  (NORCO/VICODIN) 5-325 MG tablet, Take 1 tablet by mouth every 4 (four) hours as needed for moderate pain. (Patient not taking: Reported on 10/23/2022), Disp: 20 tablet, Rfl: 0   ketoconazole  (NIZORAL ) 2 % shampoo, Apply 1 Application topically 3 (three) times a week. Wash scalp 3 times a week, let sit 5 minutes and rinse out, for seborrheic dermatitis, Disp: 120 mL, Rfl: 4   ketorolac  (ACULAR ) 0.5 % ophthalmic solution, Place 1 drop into the left eye 4 (four) times daily., Disp: , Rfl:    minoxidil  (LONITEN ) 2.5 MG tablet, Take 1/2 tablet once daily (Patient not taking: Reported on 04/24/2022), Disp: 15 tablet, Rfl: 2   mometasone  (ELOCON ) 0.1 % cream, Apply once daily up to 5 days a week as needed, Disp: 45 g, Rfl: 2   Multiple Vitamin (MULTIVITAMIN) tablet, Take 2 tablets by mouth daily., Disp: , Rfl:    OXYGEN, Inhale 2 L into the lungs at bedtime., Disp: , Rfl:    pantoprazole  (PROTONIX ) 20 MG tablet, Take by mouth., Disp: , Rfl:    pantoprazole  (PROTONIX ) 20 MG  tablet, Take 20 mg by mouth daily. (Patient not taking: Reported on 10/23/2022), Disp: , Rfl:    polyethylene glycol-electrolytes (NULYTELY ) 420 g solution, See admin instructions., Disp: , Rfl:    prednisoLONE acetate (PRED FORTE) 1 % ophthalmic suspension, Place 1 drop into the left eye 4 (four) times daily., Disp: , Rfl:    tacrolimus  (PROTOPIC ) 0.1 % ointment, Apply topically as directed. Qd to bid to aa hands  for eczema prn flares, Disp: 100 g, Rfl: 3   traZODone  (DESYREL ) 50 MG tablet, Take by mouth. (Patient not taking: Reported on 10/23/2022), Disp: , Rfl:    VENTOLIN  HFA 108 (90 Base) MCG/ACT inhaler, Inhale 2 puffs into the lungs every evening., Disp: , Rfl:   Past Medical History: Past Medical History:  Diagnosis Date   Acute on chronic respiratory failure with hypoxia (HCC)    Allergic genetic state    Breast cancer, right (HCC) 1996   a.) s/p total mastectomy   Bronchitis    Chronic eczema    COPD (chronic obstructive pulmonary disease) (HCC)    Coronary artery calcification seen on CT scan    Dependence on nocturnal oxygen therapy    a.) 2 L/Liberal   Diastolic dysfunction    a.) TTE 02/28/2021: EF 50%, triv MR/TR, G1DD; b.) TEE 07/16/2021: EF 55-60%, G1DD.   Diverticulitis of large intestine with abscess without bleeding    DOE (dyspnea on exertion)    History of 2019 novel coronavirus  disease (COVID-19) 08/2021   History of chicken pox    History of colon polyps    Hyperlipidemia    Hypophosphatemia    Hypotension    MDD (major depressive disorder)    Migraines    Perforated diverticulum of intestine    Peripheral vascular disease with claudication (HCC)    PSVT (paroxysmal supraventricular tachycardia) (HCC)    PVC (premature ventricular contraction)    Severe sepsis with lactic acidosis (HCC)    Single kidney    Squamous cell carcinoma of skin 01/27/2019   Right inferior lateral thigh. KA-type   Thoracic aortic atherosclerosis (HCC)    Tobacco use     Tobacco  Use: Social History   Tobacco Use  Smoking Status Former   Current packs/day: 0.00   Average packs/day: 1.5 packs/day for 40.0 years (60.0 ttl pk-yrs)   Types: Cigarettes   Start date: 07/1981   Quit date: 07/2021   Years since quitting: 1.5  Smokeless Tobacco Never    Labs: Review Flowsheet       Latest Ref Rng & Units 08/21/2021  Labs for ITP Cardiac and Pulmonary Rehab  Bicarbonate 20.0 - 28.0 mmol/L 27.7   O2 Saturation % 85.6      Pulmonary Assessment Scores:  Pulmonary Assessment Scores     Row Name 12/04/22 1623         ADL UCSD   ADL Phase Entry     SOB Score total 63     Rest 0     Walk 3     Stairs 4     Bath 3     Dress 3     Shop 2       CAT Score   CAT Score 33       mMRC Score   mMRC Score 3              UCSD: Self-administered rating of dyspnea associated with activities of daily living (ADLs) 6-point scale (0 = "not at all" to 5 = "maximal or unable to do because of breathlessness")  Scoring Scores range from 0 to 120.  Minimally important difference is 5 units  CAT: CAT can identify the health impairment of COPD patients and is better correlated with disease progression.  CAT has a scoring range of zero to 40. The CAT score is classified into four groups of low (less than 10), medium (10 - 20), high (21-30) and very high (31-40) based on the impact level of disease on health status. A CAT score over 10 suggests significant symptoms.  A worsening CAT score could be explained by an exacerbation, poor medication adherence, poor inhaler technique, or progression of COPD or comorbid conditions.  CAT MCID is 2 points  mMRC: mMRC (Modified Medical Research Council) Dyspnea Scale is used to assess the degree of baseline functional disability in patients of respiratory disease due to dyspnea. No minimal important difference is established. A decrease in score of 1 point or greater is considered a positive change.   Pulmonary Function  Assessment:  Pulmonary Function Assessment - 12/04/22 1625       Pulmonary Function Tests   FVC% 83 %    FEV1% 38 %    FEV1/FVC Ratio 34.64             Exercise Target Goals: Exercise Program Goal: Individual exercise prescription set using results from initial 6 min walk test and THRR while considering  patient's activity barriers and safety.   Exercise Prescription  Goal: Initial exercise prescription builds to 30-45 minutes a day of aerobic activity, 2-3 days per week.  Home exercise guidelines will be given to patient during program as part of exercise prescription that the participant will acknowledge.  Education: Aerobic Exercise: - Group verbal and visual presentation on the components of exercise prescription. Introduces F.I.T.T principle from ACSM for exercise prescriptions.  Reviews F.I.T.T. principles of aerobic exercise including progression. Written material given at graduation. Flowsheet Row Pulmonary Rehab from 01/24/2023 in [facility] Cardiac and Pulmonary Rehab  Date 01/24/23  Educator [facility]  Instruction Review Code 1- [employer] Understanding       Education: Resistance Exercise: - Group verbal and visual presentation on the components of exercise prescription. Introduces F.I.T.T principle from ACSM for exercise prescriptions  Reviews F.I.T.T. principles of resistance exercise including progression. Written material given at graduation. Flowsheet Row Pulmonary Rehab from 01/24/2023 in [facility] Cardiac and Pulmonary Rehab  Date 01/15/23  Educator NT  Instruction Review Code 1- Verbalizes Understanding        Education: Exercise & Equipment Safety: - Individual verbal instruction and demonstration of equipment use and safety with use of the equipment. Flowsheet Row Pulmonary Rehab from 01/24/2023 in [facility] Cardiac and Pulmonary Rehab  Date 12/04/22  Educator [facility]  Instruction Review Code 1- Verbalizes Understanding       Education: Exercise Physiology & General Exercise  Guidelines: - Group verbal and written instruction with models to review the exercise physiology of the cardiovascular system and associated critical values. Provides general exercise guidelines with specific guidelines to those with heart or lung disease.  Flowsheet Row Pulmonary Rehab from 01/24/2023 in [facility] Cardiac and Pulmonary Rehab  Date 01/08/23  Educator [facility]  Instruction Review Code 1- [employer] Understanding       Education: Flexibility, Balance, Mind/Body Relaxation: - Group verbal and visual presentation with interactive activity on the components of exercise prescription. Introduces F.I.T.T principle from ACSM for exercise prescriptions. Reviews F.I.T.T. principles of flexibility and balance exercise training including progression. Also discusses the mind body connection.  Reviews various relaxation techniques to help reduce and manage stress (i.e. Deep breathing, progressive muscle relaxation, and visualization). Balance handout provided to take home. Written material given at graduation. Flowsheet Row Pulmonary Rehab from 01/24/2023 in [facility] Cardiac and Pulmonary Rehab  Date 01/15/23  Educator NT  Instruction Review Code 1- Verbalizes Understanding       Activity Barriers & Risk Stratification:  Activity Barriers & Cardiac Risk Stratification - 12/04/22 1611       Activity Barriers & Cardiac Risk Stratification   Activity Barriers Other (comment)    Comments lower back pain occationally (no history of injury)             6 Minute Walk:  6 Minute Walk     Row Name 12/04/22 1606         6 Minute Walk   Phase Initial     Distance 790 feet     Walk Time 6 minutes     # of Rest Breaks 0     MPH 1.5     METS 2.4     RPE 13     Perceived Dyspnea  3     VO2 Peak 8.4     Symptoms Yes (comment)     Comments SOB 3     Resting HR 96 bpm     Resting BP 122/82     Resting Oxygen Saturation  90 %     Exercise Oxygen Saturation  during 6 min walk 78 %     Max Ex. HR  144 bpm     Max Ex. BP 150/80     2 Minute Post BP 124/80       Interval HR   1 Minute HR 100     2 Minute HR 122     3 Minute HR 144     4 Minute HR 141     5 Minute HR 140     6 Minute HR 142     2 Minute Post HR 112     Interval Heart Rate? Yes       Interval Oxygen   Interval Oxygen? Yes     Baseline Oxygen Saturation % 90 %     1 Minute Oxygen Saturation % 86 %     1 Minute Liters of Oxygen 0 L     2 Minute Oxygen Saturation % 85 %     2 Minute Liters of Oxygen 0 L     3 Minute Oxygen Saturation % 81 %     3 Minute Liters of Oxygen 0 L     4 Minute Oxygen Saturation % 80 %     4 Minute Liters of Oxygen 0 L     5 Minute Oxygen Saturation % 80 %     5 Minute Liters of Oxygen 0 L     6 Minute Oxygen Saturation % 78 %     6 Minute Liters of Oxygen 0 L     2 Minute Post Oxygen Saturation % 90 %     2 Minute Post Liters of Oxygen 0 L             Oxygen Initial Assessment:  Oxygen Initial Assessment - 12/04/22 1625       Home Oxygen   Home Oxygen Device Home Concentrator;E-Tanks;Portable Concentrator    Sleep Oxygen Prescription Continuous    Liters per minute 2    Home Exercise Oxygen Prescription Pulsed   as needed   Liters per minute 2    Home Resting Oxygen Prescription Pulsed   as needed   Liters per minute 2    Compliance with Home Oxygen Use Yes   Patient uses O2 at night. She reports that she does not use her portable consentrator but she was encouraged to monitor her SaO2 at home and use her pulsed O2 if her SaO2 drops below 88% during the day b/c her SaO2 dropped significantly during her .     Initial 6 min Walk   Oxygen Used None      Program Oxygen Prescription   Program Oxygen Prescription Continuous    Liters per minute 2    Comments Patient's SaO2 dropped significantly during her . Lowest reading was 78%. Will use continuous O2 as needed up to 2 L to maintain SaO2 >88% during exercise.      Intervention   Short Term Goals To learn and  exhibit compliance with exercise, home and travel O2 prescription;To learn and understand importance of monitoring SPO2 with pulse oximeter and demonstrate accurate use of the pulse oximeter.;To learn and understand importance of maintaining oxygen saturations>88%;To learn and demonstrate proper pursed lip breathing techniques or other breathing techniques. ;To learn and demonstrate proper use of respiratory medications    Long  Term Goals Exhibits compliance with exercise, home  and travel O2 prescription;Verbalizes importance of monitoring SPO2 with pulse oximeter and return demonstration;Maintenance of O2 saturations>88%;Exhibits proper breathing techniques, such as  pursed lip breathing or other method taught during program session;Compliance with respiratory medication;Demonstrates proper use of MDI's             Oxygen Re-Evaluation:  Oxygen Re-Evaluation     Row Name 12/21/22 1408             Program Oxygen Prescription   Program Oxygen Prescription Continuous       Liters per minute 2         Home Oxygen   Home Oxygen Device Home Concentrator;E-Tanks;Portable Concentrator       Sleep Oxygen Prescription Continuous       Liters per minute 2       Home Exercise Oxygen Prescription Pulsed  PRN       Liters per minute 2       Home Resting Oxygen Prescription Pulsed       Liters per minute 2       Compliance with Home Oxygen Use Yes         Goals/Expected Outcomes   Short Term Goals To learn and demonstrate proper pursed lip breathing techniques or other breathing techniques.        Long  Term Goals Exhibits proper breathing techniques, such as pursed lip breathing or other method taught during program session       Comments Informed patient how to perform the Pursed Lipped breathing technique. Told patient to Inhale through the nose and out the mouth with pursed lips to keep their airways open, help oxygenate them better, practice when at rest or doing strenuous activity.  Patient Verbalizes understanding of technique and will work on and be reiterated during [facility].       Goals/Expected Outcomes Short: use PLB with exertion. Long: use PLB on exertion proficiently and independently.                Oxygen Discharge (Final Oxygen Re-Evaluation):  Oxygen Re-Evaluation - 12/21/22 1408       Program Oxygen Prescription   Program Oxygen Prescription Continuous    Liters per minute 2      Home Oxygen   Home Oxygen Device Home Concentrator;E-Tanks;Portable Concentrator    Sleep Oxygen Prescription Continuous    Liters per minute 2    Home Exercise Oxygen Prescription Pulsed   PRN   Liters per minute 2    Home Resting Oxygen Prescription Pulsed    Liters per minute 2    Compliance with Home Oxygen Use Yes      Goals/Expected Outcomes   Short Term Goals To learn and demonstrate proper pursed lip breathing techniques or other breathing techniques.     Long  Term Goals Exhibits proper breathing techniques, such as pursed lip breathing or other method taught during program session    Comments Informed patient how to perform the Pursed Lipped breathing technique. Told patient to Inhale through the nose and out the mouth with pursed lips to keep their airways open, help oxygenate them better, practice when at rest or doing strenuous activity. Patient Verbalizes understanding of technique and will work on and be reiterated during [facility].    Goals/Expected Outcomes Short: use PLB with exertion. Long: use PLB on exertion proficiently and independently.             Initial Exercise Prescription:  Initial Exercise Prescription - 12/04/22 1600       Date of Initial Exercise RX and Referring Provider   Date 12/04/22    Referring Provider Dr. Faud Alexkerov  Oxygen   Oxygen Continuous    Liters 2   use supplemental O2 as needed up to 2 L to maintain SaO2 >88%   Maintain Oxygen Saturation 88% or higher      Treadmill   MPH 1.3    Grade 1     Minutes 15    METs 2.17      NuStep   Level 1   T6   SPM 80    Minutes 15    METs 2.4      REL-XR   Level 1    Speed 50    Minutes 15    METs 2.4      Biostep-RELP   Level 1    SPM 50    Minutes 15    METs 2.4      Prescription Details   Frequency (times per week) 3    Duration Progress to 30 minutes of continuous aerobic without signs/symptoms of physical distress      Intensity   THRR 40-80% of Max Heartrate 116-140    Ratings of Perceived Exertion 11-13    Perceived Dyspnea 0-4      Progression   Progression Continue to progress workloads to maintain intensity without signs/symptoms of physical distress.      Resistance Training   Training Prescription Yes    Weight 1    Reps 10-15             Perform Capillary Blood Glucose checks as needed.  Exercise Prescription Changes:   Exercise Prescription Changes     Row Name 12/04/22 1600 12/28/22 1600 01/09/23 0700 01/24/23 1400       Response to Exercise   Blood Pressure (Admit) 122/82 128/72 118/64 132/70    Blood Pressure (Exercise) 150/80 138/64 138/72 122/56    Blood Pressure (Exit) 124/80 112/60 120/64 122/80    Heart Rate (Admit) 96 bpm 92 bpm 79 bpm 97 bpm    Heart Rate (Exercise) 144 bpm 130 bpm 141 bpm 119 bpm    Heart Rate (Exit) 98 bpm 106 bpm 105 bpm 90 bpm    Oxygen Saturation (Admit) 90 % 90 % 93 % 89 %    Oxygen Saturation (Exercise) 78 % 88 % 88 % 88 %    Oxygen Saturation (Exit) 90 % 92 % 93 % 95 %    Rating of Perceived Exertion (Exercise) 13 15 15 14     Perceived Dyspnea (Exercise) 3 2 3.5 2    Symptoms SOB 3 3 SOB none    Comments 6 MWT results First three days of exercise -- --    Duration -- Progress to 30 minutes of  aerobic without signs/symptoms of physical distress Progress to 30 minutes of  aerobic without signs/symptoms of physical distress Progress to 30 minutes of  aerobic without signs/symptoms of physical distress    Intensity -- THRR unchanged THRR unchanged THRR  unchanged      Progression   Progression -- Continue to progress workloads to maintain intensity without signs/symptoms of physical distress. Continue to progress workloads to maintain intensity without signs/symptoms of physical distress. Continue to progress workloads to maintain intensity without signs/symptoms of physical distress.    Average METs -- 1.98 1.95 1.97      Resistance Training   Training Prescription -- Yes Yes Yes    Weight -- 2 lb 2 lb 2 lb    Reps -- 10-15 10-15 10-15      Interval Training   Interval Training --  No No No      Oxygen   Oxygen -- Continuous Continuous Continuous    Liters -- 2  use supplemental O2 as needed up to 2 L to maintain SaO2 >88% 2  use supplemental O2 as needed up to 2 L to maintain SaO2 >88% 2      Treadmill   MPH -- 1.3 1.5 1.4    Grade -- 0 0 0    Minutes -- 15 15 15     METs -- 1.99 2.15 2.07      NuStep   Level -- -- 2  T6 nustep 2  T6    Minutes -- -- 15 15    METs -- -- 1.7 1.6      REL-XR   Level -- 1 1 1     Minutes -- 15 15 15     METs -- -- 2.4 2.9      Biostep-RELP   Level -- 1 1 --    Minutes -- 15 15 --    METs -- 2 2 --      Oxygen   Maintain Oxygen Saturation -- 88% or higher 88% or higher 88% or higher             Exercise Comments:   Exercise Comments     Row Name 12/18/22 1350           Exercise Comments First full day of exercise!  Patient was oriented to gym and equipment including functions, settings, policies, and procedures.  Patient's individual exercise prescription and treatment plan were reviewed.  All starting workloads were established based on the results of the 6 minute walk test done at initial orientation visit.  The plan for exercise progression was also introduced and progression will be customized based on patient's performance and goals.                Exercise Goals and Review:   Exercise Goals     Row Name 12/04/22 1619             Exercise Goals   Increase  Physical Activity Yes       Intervention Provide advice, education, support and counseling about physical activity/exercise needs.;Develop an individualized exercise prescription for aerobic and resistive training based on initial evaluation findings, risk stratification, comorbidities and participant's personal goals.       Expected Outcomes Short Term: Attend rehab on a regular basis to increase amount of physical activity.;Long Term: Add in home exercise to make exercise part of routine and to increase amount of physical activity.;Long Term: Exercising regularly at least 3-5 days a week.       Increase Strength and Stamina Yes       Intervention Provide advice, education, support and counseling about physical activity/exercise needs.;Develop an individualized exercise prescription for aerobic and resistive training based on initial evaluation findings, risk stratification, comorbidities and participant's personal goals.       Expected Outcomes Short Term: Increase workloads from initial exercise prescription for resistance, speed, and METs.;Short Term: Perform resistance training exercises routinely during rehab and add in resistance training at home;Long Term: Improve cardiorespiratory fitness, muscular endurance and strength as measured by increased METs and functional capacity ( )       Able to understand and use rate of perceived exertion (RPE) scale Yes       Intervention Provide education and explanation on how to use RPE scale       Expected Outcomes Short Term: Able to use RPE  daily in rehab to express subjective intensity level;Long Term:  Able to use RPE to guide intensity level when exercising independently       Able to understand and use Dyspnea scale Yes       Intervention Provide education and explanation on how to use Dyspnea scale       Expected Outcomes Short Term: Able to use Dyspnea scale daily in rehab to express subjective sense of shortness of breath during exertion;Long Term:  Able to use Dyspnea scale to guide intensity level when exercising independently       Knowledge and understanding of Target Heart Rate Range (THRR) Yes       Intervention Provide education and explanation of THRR including how the numbers were predicted and where they are located for reference       Expected Outcomes Short Term: Able to state/look up THRR;Long Term: Able to use THRR to govern intensity when exercising independently;Short Term: Able to use daily as guideline for intensity in rehab       Able to check pulse independently Yes       Intervention Provide education and demonstration on how to check pulse in carotid and radial arteries.;Review the importance of being able to check your own pulse for safety during independent exercise       Expected Outcomes Short Term: Able to explain why pulse checking is important during independent exercise;Long Term: Able to check pulse independently and accurately       Understanding of Exercise Prescription Yes       Intervention Provide education, explanation, and written materials on patient's individual exercise prescription       Expected Outcomes Short Term: Able to explain program exercise prescription;Long Term: Able to explain home exercise prescription to exercise independently                Exercise Goals Re-Evaluation :  Exercise Goals Re-Evaluation     Row Name 12/18/22 1351 12/28/22 1629 01/09/23 0753 01/24/23 1414       Exercise Goal Re-Evaluation   Exercise Goals Review Increase Physical Activity;Able to understand and use rate of perceived exertion (RPE) scale;Knowledge and understanding of Target Heart Rate Range (THRR);Understanding of Exercise Prescription;Increase Strength and Stamina;Able to check pulse independently;Able to understand and use Dyspnea scale Increase Physical Activity;Increase Strength and Stamina;Understanding of Exercise Prescription Increase Physical Activity;Increase Strength and Stamina;Understanding  of Exercise Prescription Increase Physical Activity;Increase Strength and Stamina;Understanding of Exercise Prescription    Comments Reviewed RPE and dyspnea scale, THR and program prescription with pt today.  Pt voiced understanding and was given a copy of goals to take home. Brianna Briggs is off to a good start in the program. She did well on the treadmill at a speed of 1.3 mph with no incline. She also did well working at level 1 on both the XR and biostep. She was able to use 2 lb hand weights for resistance training as well. We will continue to monitor her progress in the program. Brianna Briggs is doing well in the program. She recently increased his treadmill speed to 1.5 mph with no incline. She also improved to level 2 on the XR and stayed consistent at level 1 on the XR and biostep. We will continue to monitor her progress in the program. Brianna Briggs is doing well in rehab. She was unable to make any increases to her workload during this review. She however was able to maintain her workload on the treadmill and the T6 nustep.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

## 2023-02-01 ENCOUNTER — Encounter: Payer: Medicare Other | Admitting: *Deleted

## 2023-02-01 DIAGNOSIS — J449 Chronic obstructive pulmonary disease, unspecified: Secondary | ICD-10-CM

## 2023-02-01 NOTE — Progress Notes (Signed)
Daily Session Note  Patient Details  Name: Brianna Briggs MRN: [id number] Date of Birth: 1950/12/16 Referring Provider:   Flowsheet Row Pulmonary Rehab from 12/04/2022 in [facility] Cardiac and Pulmonary Rehab  Referring Provider Dr. Inda Coke       Encounter Date: 02/01/2023  Check In:  Session Check In - 02/01/23 1405       Check-In   Supervising physician immediately available to respond to emergencies See telemetry face sheet for immediately available ER MD    Location [facility]-Cardiac & Pulmonary Rehab    Staff Present Cora Collum, RN, BSN, CCRP;Joseph Hood RCP,RRT,BSRT;Noah Tickle, [place], Exercise Physiologist;Maxon Conetta BS, Exercise Physiologist    Virtual Visit No    Medication changes reported     No    Fall or balance concerns reported    No    Warm-up and Cool-down Performed on first and last piece of equipment    Resistance Training Performed Yes    VAD Patient? No      Pain Assessment   Currently in Pain? No/denies                Social History   Tobacco Use  Smoking Status Former   Current packs/day: 0.00   Average packs/day: 1.5 packs/day for 40.0 years (60.0 ttl pk-yrs)   Types: Cigarettes   Start date: 07/1981   Quit date: 07/2021   Years since quitting: 1.5  Smokeless Tobacco Never    Goals Met:  Proper associated with RPD/PD & O2 Sat Independence with exercise equipment Exercise tolerated well No report of concerns or symptoms today  Goals Unmet:  Not Applicable  Comments: Pt able to follow exercise prescription today without complaint.  Will continue to monitor for progression.    Dr. Bethann Punches is Medical Director for [facility] Cardiac Rehabilitation.  Dr. Vida Rigger is Medical Director for [facility] Pulmonary Rehabilitation.

## 2023-02-05 ENCOUNTER — Encounter: Payer: Medicare Other | Admitting: *Deleted

## 2023-02-05 DIAGNOSIS — J449 Chronic obstructive pulmonary disease, unspecified: Secondary | ICD-10-CM

## 2023-02-05 NOTE — Progress Notes (Signed)
Daily Session Note  Patient Details  Name: Brianna Briggs MRN: [id number] Date of Birth: October 10, 1950 Referring Provider:   Flowsheet Row Pulmonary Rehab from 12/04/2022 in [facility] Cardiac and Pulmonary Rehab  Referring Provider Dr. Inda Coke       Encounter Date: 02/05/2023  Check In:  Session Check In - 02/05/23 1349       Check-In   Supervising physician immediately available to respond to emergencies See telemetry face sheet for immediately available ER MD    Location [facility]-Cardiac & Pulmonary Rehab    Staff Present Maxon Conetta BS, Exercise Physiologist;Joseph Hood RCP,RRT,BSRT;Viral Schramm RN,BSN;Noah Tickle, [place], Exercise Physiologist    Virtual Visit No    Medication changes reported     No    Fall or balance concerns reported    No    Warm-up and Cool-down Performed on first and last piece of equipment    Resistance Training Performed Yes    VAD Patient? No    PAD/SET Patient? No      Pain Assessment   Currently in Pain? No/denies                Social History   Tobacco Use  Smoking Status Former   Current packs/day: 0.00   Average packs/day: 1.5 packs/day for 40.0 years (60.0 ttl pk-yrs)   Types: Cigarettes   Start date: 07/1981   Quit date: 07/2021   Years since quitting: 1.5  Smokeless Tobacco Never    Goals Met:  Independence with exercise equipment Exercise tolerated well No report of concerns or symptoms today Strength training completed today  Goals Unmet:  Not Applicable  Comments: Pt able to follow exercise prescription today without complaint.  Will continue to monitor for progression.    Dr. Bethann Punches is Medical Director for [facility] Cardiac Rehabilitation.  Dr. Vida Rigger is Medical Director for [facility] Pulmonary Rehabilitation.

## 2023-02-07 ENCOUNTER — Encounter: Payer: Medicare Other | Admitting: *Deleted

## 2023-02-07 DIAGNOSIS — J449 Chronic obstructive pulmonary disease, unspecified: Secondary | ICD-10-CM

## 2023-02-07 NOTE — Progress Notes (Signed)
Daily Session Note  Patient Details  Name: Brianna Briggs MRN: [id number] Date of Birth: [birth year] Referring Provider:   Flowsheet Row Pulmonary Rehab from 12/04/2022 in [facility] Cardiac and Pulmonary Rehab  Referring Provider Dr. Inda Coke       Encounter Date: 02/07/2023  Check In:  Session Check In - 02/07/23 1342       Check-In   Supervising physician immediately available to respond to emergencies See telemetry face sheet for immediately available ER MD    Staff Present Susann Givens RN,BSN;Noah Tickle, BS, Exercise Physiologist;Kelly Cloretta Ned, ACSM CEP, Exercise Physiologist    Virtual Visit No    Medication changes reported     No    Fall or balance concerns reported    No    Warm-up and Cool-down Performed on first and last piece of equipment    Resistance Training Performed Yes    VAD Patient? No    PAD/SET Patient? No      Pain Assessment   Currently in Pain? No/denies                Social History   Tobacco Use  Smoking Status Former   Current packs/day: 0.00   Average packs/day: 1.5 packs/day for 40.0 years (60.0 ttl pk-yrs)   Types: Cigarettes   Start date: 07/1981   Quit date: 07/2021   Years since quitting: 1.5  Smokeless Tobacco Never    Goals Met:  Independence with exercise equipment Exercise tolerated well No report of concerns or symptoms today Strength training completed today  Goals Unmet:  Not Applicable  Comments: Pt able to follow exercise prescription today without complaint.  Will continue to monitor for progression.    Dr. Bethann Punches is Medical Director for [facility] Cardiac Rehabilitation.  Dr. Vida Rigger is Medical Director for [facility] Pulmonary Rehabilitation.

## 2023-02-08 ENCOUNTER — Encounter: Payer: Medicare Other | Admitting: *Deleted

## 2023-02-08 DIAGNOSIS — J449 Chronic obstructive pulmonary disease, unspecified: Secondary | ICD-10-CM

## 2023-02-08 NOTE — Progress Notes (Signed)
Daily Session Note  Patient Details  Name: Brianna Briggs MRN: [id number] Date of Birth: 05-06-1950 Referring Provider:   Flowsheet Row Pulmonary Rehab from 12/04/2022 in [facility] Cardiac and Pulmonary Rehab  Referring Provider Dr. Inda Coke       Encounter Date: 02/08/2023  Check In:  Session Check In - 02/08/23 1408       Check-In   Supervising physician immediately available to respond to emergencies See telemetry face sheet for immediately available ER MD    Location [facility]-Cardiac & Pulmonary Rehab    Staff Present Maxon Conetta BS, Exercise Physiologist;Noah Tickle, BS, Exercise Physiologist;Ettel Albergo Jewel Baize RN,BSN;Susanne Bice, RN, BSN, CCRP    Virtual Visit No    Medication changes reported     No    Fall or balance concerns reported    No    Warm-up and Cool-down Performed on first and last piece of equipment    Resistance Training Performed Yes    VAD Patient? No    PAD/SET Patient? No      Pain Assessment   Currently in Pain? No/denies                Social History   Tobacco Use  Smoking Status Former   Current packs/day: 0.00   Average packs/day: 1.5 packs/day for 40.0 years (60.0 ttl pk-yrs)   Types: Cigarettes   Start date: 07/1981   Quit date: 07/2021   Years since quitting: 1.5  Smokeless Tobacco Never    Goals Met:  Independence with exercise equipment Exercise tolerated well No report of concerns or symptoms today Strength training completed today  Goals Unmet:  Not Applicable  Comments: Pt able to follow exercise prescription today without complaint.  Will continue to monitor for progression.    Dr. Bethann Punches is Medical Director for [facility] Cardiac Rehabilitation.  Dr. Vida Rigger is Medical Director for [facility] Pulmonary Rehabilitation.

## 2023-02-12 ENCOUNTER — Encounter: Payer: Medicare Other | Admitting: *Deleted

## 2023-02-12 DIAGNOSIS — J449 Chronic obstructive pulmonary disease, unspecified: Secondary | ICD-10-CM

## 2023-02-12 NOTE — Progress Notes (Signed)
Daily Session Note  Patient Details  Name: Aleighya Mcanelly MRN: [id number] Date of Birth: [birth year] Referring Provider:   Flowsheet Row Pulmonary Rehab from 12/04/2022 in [facility] Cardiac and Pulmonary Rehab  Referring Provider Dr. Inda Coke       Encounter Date: 02/12/2023  Check In:  Session Check In - 02/12/23 1354       Check-In   Supervising physician immediately available to respond to emergencies See telemetry face sheet for immediately available ER MD    Location [facility]-Cardiac & Pulmonary Rehab    Staff Present Susann Givens RN,BSN;Joseph Reino Kent RCP,RRT,BSRT;Noah Tickle, [place], Exercise Physiologist;Maxon Conetta BS, Exercise Physiologist    Virtual Visit No    Medication changes reported     No    Fall or balance concerns reported    No    Warm-up and Cool-down Performed on first and last piece of equipment    Resistance Training Performed Yes    VAD Patient? No    PAD/SET Patient? No      Pain Assessment   Currently in Pain? No/denies                Social History   Tobacco Use  Smoking Status Former   Current packs/day: 0.00   Average packs/day: 1.5 packs/day for 40.0 years (60.0 ttl pk-yrs)   Types: Cigarettes   Start date: 07/1981   Quit date: 07/2021   Years since quitting: 1.5  Smokeless Tobacco Never    Goals Met:  Independence with exercise equipment Exercise tolerated well No report of concerns or symptoms today Strength training completed today  Goals Unmet:  Not Applicable  Comments: Pt able to follow exercise prescription today without complaint.  Will continue to monitor for progression.    Dr. Bethann Punches is Medical Director for [facility] Cardiac Rehabilitation.  Dr. Vida Rigger is Medical Director for [facility] Pulmonary Rehabilitation.

## 2023-02-14 ENCOUNTER — Encounter: Payer: Medicare Other | Admitting: *Deleted

## 2023-02-14 DIAGNOSIS — J449 Chronic obstructive pulmonary disease, unspecified: Secondary | ICD-10-CM

## 2023-02-14 NOTE — Progress Notes (Signed)
Daily Session Note  Patient Details  Name: Brianna Briggs MRN: [id number] Date of Birth: 1950-11-27 Referring Provider:   Flowsheet Row Pulmonary Rehab from 12/04/2022 in [facility] Cardiac and Pulmonary Rehab  Referring Provider Dr. Inda Coke       Encounter Date: 02/14/2023  Check In:  Session Check In - 02/14/23 1339       Check-In   Supervising physician immediately available to respond to emergencies See telemetry face sheet for immediately available ER MD    Location [facility]-Cardiac & Pulmonary Rehab    Staff Present Maxon Conetta BS, Exercise Physiologist;Kelly Cloretta Ned, ACSM CEP, Exercise Physiologist;Valeen Borys Jewel Baize RN,BSN;Noah Tickle, BS, Exercise Physiologist    Virtual Visit No    Medication changes reported     No    Fall or balance concerns reported    No    Warm-up and Cool-down Performed on first and last piece of equipment    Resistance Training Performed Yes    VAD Patient? No    PAD/SET Patient? No      Pain Assessment   Currently in Pain? No/denies                Social History   Tobacco Use  Smoking Status Former   Current packs/day: 0.00   Average packs/day: 1.5 packs/day for 40.0 years (60.0 ttl pk-yrs)   Types: Cigarettes   Start date: 07/1981   Quit date: 07/2021   Years since quitting: 1.5  Smokeless Tobacco Never    Goals Met:  Independence with exercise equipment Exercise tolerated well No report of concerns or symptoms today Strength training completed today  Goals Unmet:  Not Applicable  Comments: Pt able to follow exercise prescription today without complaint.  Will continue to monitor for progression.    Dr. Bethann Punches is Medical Director for [facility] Cardiac Rehabilitation.  Dr. Vida Rigger is Medical Director for [facility] Pulmonary Rehabilitation.

## 2023-02-15 ENCOUNTER — Encounter: Payer: Medicare Other | Admitting: *Deleted

## 2023-02-15 DIAGNOSIS — J449 Chronic obstructive pulmonary disease, unspecified: Secondary | ICD-10-CM

## 2023-02-15 NOTE — Progress Notes (Signed)
Daily Session Note  Patient Details  Name: Brianna Briggs MRN: [id number] Date of Birth: 09-21-1950 Referring Provider:   Flowsheet Row Pulmonary Rehab from 12/04/2022 in [facility] Cardiac and Pulmonary Rehab  Referring Provider Dr. Inda Coke       Encounter Date: 02/15/2023  Check In:  Session Check In - 02/15/23 1348       Check-In   Supervising physician immediately available to respond to emergencies See telemetry face sheet for immediately available ER MD    Location [facility]-Cardiac & Pulmonary Rehab    Staff Present Susann Givens RN,BSN;Joseph Reino Kent RCP,RRT,BSRT;Noah Tickle, [place], Exercise Physiologist;Maxon Conetta BS, Exercise Physiologist    Virtual Visit No    Medication changes reported     No    Fall or balance concerns reported    No    Warm-up and Cool-down Performed on first and last piece of equipment    Resistance Training Performed Yes    VAD Patient? No    PAD/SET Patient? No      Pain Assessment   Currently in Pain? No/denies                Social History   Tobacco Use  Smoking Status Former   Current packs/day: 0.00   Average packs/day: 1.5 packs/day for 40.0 years (60.0 ttl pk-yrs)   Types: Cigarettes   Start date: 07/1981   Quit date: 07/2021   Years since quitting: 1.5  Smokeless Tobacco Never    Goals Met:  Independence with exercise equipment Exercise tolerated well No report of concerns or symptoms today Strength training completed today  Goals Unmet:  Not Applicable  Comments: Pt able to follow exercise prescription today without complaint.  Will continue to monitor for progression.    Dr. Bethann Punches is Medical Director for [facility] Cardiac Rehabilitation.  Dr. Vida Rigger is Medical Director for [facility] Pulmonary Rehabilitation.

## 2023-02-19 ENCOUNTER — Encounter: Payer: Medicare Other | Attending: Pulmonary Disease | Admitting: *Deleted

## 2023-02-19 DIAGNOSIS — Z87891 Personal history of nicotine dependence: Secondary | ICD-10-CM | POA: Insufficient documentation

## 2023-02-19 DIAGNOSIS — J449 Chronic obstructive pulmonary disease, unspecified: Secondary | ICD-10-CM | POA: Diagnosis present

## 2023-02-19 NOTE — Progress Notes (Signed)
Daily Session Note  Patient Details  Name: Brianna Briggs MRN: [id number] Date of Birth: 1950-06-15 Referring Provider:   Flowsheet Row Pulmonary Rehab from 12/04/2022 in [facility] Cardiac and Pulmonary Rehab  Referring Provider Dr. Inda Coke       Encounter Date: 02/19/2023  Check In:  Session Check In - 02/19/23 1353       Check-In   Location [facility]-Cardiac & Pulmonary Rehab    Staff Present Susann Givens RN,BSN;Joseph Hood RCP,RRT,BSRT;Maxon Conetta BS, Exercise Physiologist;Noah Tickle, BS, Exercise Physiologist    Virtual Visit No    Medication changes reported     No    Fall or balance concerns reported    No    Warm-up and Cool-down Performed on first and last piece of equipment    Resistance Training Performed Yes    VAD Patient? No    PAD/SET Patient? No      Pain Assessment   Currently in Pain? No/denies                Social History   Tobacco Use  Smoking Status Former   Current packs/day: 0.00   Average packs/day: 1.5 packs/day for 40.0 years (60.0 ttl pk-yrs)   Types: Cigarettes   Start date: 07/1981   Quit date: 07/2021   Years since quitting: 1.5  Smokeless Tobacco Never    Goals Met:  Independence with exercise equipment Exercise tolerated well No report of concerns or symptoms today Strength training completed today  Goals Unmet:  Not Applicable  Comments: Pt able to follow exercise prescription today without complaint.  Will continue to monitor for progression.    Dr. Bethann Punches is Medical Director for [facility] Cardiac Rehabilitation.  Dr. Vida Rigger is Medical Director for [facility] Pulmonary Rehabilitation.

## 2023-02-21 ENCOUNTER — Encounter: Payer: Medicare Other | Admitting: *Deleted

## 2023-02-21 DIAGNOSIS — J449 Chronic obstructive pulmonary disease, unspecified: Secondary | ICD-10-CM | POA: Diagnosis not present

## 2023-02-21 NOTE — Progress Notes (Signed)
 Daily Session Note  Patient Details  Name: Brianna Briggs MRN: [id number] Date of Birth: 1950/04/07 Referring Provider:   Flowsheet Row Pulmonary Rehab from 12/04/2022 in [facility] Cardiac and Pulmonary Rehab  Referring Provider Dr. Juanita Rob       Encounter Date: 02/21/2023  Check In:  Session Check In - 02/21/23 1346       Check-In   Supervising physician immediately available to respond to emergencies See telemetry face sheet for immediately available ER MD    Location [facility]-Cardiac & Pulmonary Rehab    Staff Present Maxon Conetta BS, Exercise Physiologist;Kelly Dyane HECKLE, ACSM CEP, Exercise Physiologist;Yoshiharu Brassell Tressa RN,BSN;Noah Tickle, BS, Exercise Physiologist    Virtual Visit No    Medication changes reported     No    Fall or balance concerns reported    No    Warm-up and Cool-down Performed on first and last piece of equipment    Resistance Training Performed Yes    VAD Patient? No    PAD/SET Patient? No      Pain Assessment   Currently in Pain? No/denies                Social History   Tobacco Use  Smoking Status Former   Current packs/day: 0.00   Average packs/day: 1.5 packs/day for 40.0 years (60.0 ttl pk-yrs)   Types: Cigarettes   Start date: 07/1981   Quit date: 07/2021   Years since quitting: 1.6  Smokeless Tobacco Never    Goals Met:  Independence with exercise equipment Exercise tolerated well No report of concerns or symptoms today Strength training completed today  Goals Unmet:  Not Applicable  Comments: Pt able to follow exercise prescription today without complaint.  Will continue to monitor for progression.    Dr. Oneil Pinal is Medical Director for [facility] Cardiac Rehabilitation.  Dr. Fuad Aleskerov is Medical Director for [facility] Pulmonary Rehabilitation.

## 2023-02-22 ENCOUNTER — Encounter: Payer: Medicare Other | Admitting: *Deleted

## 2023-02-22 DIAGNOSIS — J449 Chronic obstructive pulmonary disease, unspecified: Secondary | ICD-10-CM

## 2023-02-22 NOTE — Progress Notes (Signed)
 Daily Session Note  Patient Details  Name: Brianna Briggs MRN: [id number] Date of Birth: 05/25/1950 Referring Provider:   Flowsheet Row Pulmonary Rehab from 12/04/2022 in [facility] Cardiac and Pulmonary Rehab  Referring Provider Dr. Juanita Rob       Encounter Date: 02/22/2023  Check In:  Session Check In - 02/22/23 1340       Check-In   Supervising physician immediately available to respond to emergencies See telemetry face sheet for immediately available ER MD    Location [facility]-Cardiac & Pulmonary Rehab    Staff Present Maxon Conetta BS, Exercise Physiologist;Joseph Hood RCP,RRT,BSRT;Emberley Kral RN,BSN;Noah Tickle, BS, Exercise Physiologist    Virtual Visit No    Medication changes reported     No    Fall or balance concerns reported    No    Warm-up and Cool-down Performed on first and last piece of equipment    Resistance Training Performed Yes    VAD Patient? No    PAD/SET Patient? No      Pain Assessment   Currently in Pain? No/denies                Social History   Tobacco Use  Smoking Status Former   Current packs/day: 0.00   Average packs/day: 1.5 packs/day for 40.0 years (60.0 ttl pk-yrs)   Types: Cigarettes   Start date: 07/1981   Quit date: 07/2021   Years since quitting: 1.6  Smokeless Tobacco Never    Goals Met:  Independence with exercise equipment Exercise tolerated well No report of concerns or symptoms today Strength training completed today  Goals Unmet:  Not Applicable  Comments: Pt able to follow exercise prescription today without complaint.  Will continue to monitor for progression.    Dr. Oneil Pinal is Medical Director for [facility] Cardiac Rehabilitation.  Dr. Fuad Aleskerov is Medical Director for [facility] Pulmonary Rehabilitation.

## 2023-02-28 ENCOUNTER — Encounter: Payer: Self-pay | Admitting: *Deleted

## 2023-02-28 DIAGNOSIS — J449 Chronic obstructive pulmonary disease, unspecified: Secondary | ICD-10-CM

## 2023-02-28 NOTE — Progress Notes (Signed)
 Pulmonary Individual Treatment Plan  Patient Details  Name: Brianna Briggs MRN: [id number] Date of Birth: 03-22-1950 Referring Provider:   Flowsheet Row Pulmonary Rehab from 12/04/2022 in [facility] Cardiac and Pulmonary Rehab  Referring Provider Dr. Inda Coke       Initial Encounter Date:  Flowsheet Row Pulmonary Rehab from 12/04/2022 in [facility] Cardiac and Pulmonary Rehab  Date 12/04/22       Visit Diagnosis: Chronic obstructive pulmonary disease, unspecified COPD type (HCC)  Patient's Home Medications on Admission:  Current Outpatient Medications:    Cholecalciferol 25 MCG (1000 UT) capsule, Take 2,000 Units by mouth daily., Disp: , Rfl:    gatifloxacin (ZYMAXID) 0.5 % SOLN, Place 1 drop into the left eye 3 (three) times daily., Disp: , Rfl:    guaiFENesin-dextromethorphan (ROBITUSSIN DM) 100-10 MG/5ML syrup, Take 10 mLs by mouth every 4 (four) hours as needed for cough., Disp: 118 mL, Rfl: 0   HYDROcodone-acetaminophen (NORCO/VICODIN) 5-325 MG tablet, Take 1 tablet by mouth every 4 (four) hours as needed for moderate pain. (Patient not taking: Reported on 10/23/2022), Disp: 20 tablet, Rfl: 0   ketoconazole (NIZORAL) 2 % shampoo, Apply 1 Application topically 3 (three) times a week. Wash scalp 3 times a week, let sit 5 minutes and rinse out, for seborrheic dermatitis, Disp: 120 mL, Rfl: 4   ketorolac (ACULAR) 0.5 % ophthalmic solution, Place 1 drop into the left eye 4 (four) times daily., Disp: , Rfl:    minoxidil (LONITEN) 2.5 MG tablet, Take 1/2 tablet once daily (Patient not taking: Reported on 04/24/2022), Disp: 15 tablet, Rfl: 2   mometasone (ELOCON) 0.1 % cream, Apply once daily up to 5 days a week as needed, Disp: 45 g, Rfl: 2   Multiple Vitamin (MULTIVITAMIN) tablet, Take 2 tablets by mouth daily., Disp: , Rfl:    OXYGEN, Inhale 2 L into the lungs at bedtime., Disp: , Rfl:    pantoprazole (PROTONIX) 20 MG tablet, Take by mouth., Disp: , Rfl:    pantoprazole (PROTONIX) 20 MG  tablet, Take 20 mg by mouth daily. (Patient not taking: Reported on 10/23/2022), Disp: , Rfl:    polyethylene glycol-electrolytes (NULYTELY) 420 g solution, See admin instructions., Disp: , Rfl:    prednisoLONE acetate (PRED FORTE) 1 % ophthalmic suspension, Place 1 drop into the left eye 4 (four) times daily., Disp: , Rfl:    tacrolimus (PROTOPIC) 0.1 % ointment, Apply topically as directed. Qd to bid to aa hands  for eczema prn flares, Disp: 100 g, Rfl: 3   traZODone (DESYREL) 50 MG tablet, Take by mouth. (Patient not taking: Reported on 10/23/2022), Disp: , Rfl:    VENTOLIN HFA 108 (90 Base) MCG/ACT inhaler, Inhale 2 puffs into the lungs every evening., Disp: , Rfl:   Past Medical History: Past Medical History:  Diagnosis Date   Acute on chronic respiratory failure with hypoxia (HCC)    Allergic genetic state    Breast cancer, right (HCC) 1996   a.) s/p total mastectomy   Bronchitis    Chronic eczema    COPD (chronic obstructive pulmonary disease) (HCC)    Coronary artery calcification seen on CT scan    Dependence on nocturnal oxygen therapy    a.) 2 L/[place]   Diastolic dysfunction    a.) TTE 02/28/2021: EF 50%, triv MR/TR, G1DD; b.) TEE 07/16/2021: EF 55-60%, G1DD.   Diverticulitis of large intestine with abscess without bleeding    DOE (dyspnea on exertion)    History of 2019 novel coronavirus  disease (COVID-19) 08/2021   History of chicken pox    History of colon polyps    Hyperlipidemia    Hypophosphatemia    Hypotension    MDD (major depressive disorder)    Migraines    Perforated diverticulum of intestine    Peripheral vascular disease with claudication (HCC)    PSVT (paroxysmal supraventricular tachycardia) (HCC)    PVC (premature ventricular contraction)    Severe sepsis with lactic acidosis (HCC)    Single kidney    Squamous cell carcinoma of skin 01/27/2019   Right inferior lateral thigh. KA-type   Thoracic aortic atherosclerosis (HCC)    Tobacco use     Tobacco  Use: Social History   Tobacco Use  Smoking Status Former   Current packs/day: 0.00   Average packs/day: 1.5 packs/day for 40.0 years (60.0 ttl pk-yrs)   Types: Cigarettes   Start date: 07/1981   Quit date: 07/2021   Years since quitting: 1.6  Smokeless Tobacco Never    Labs: Review Flowsheet       Latest Ref Rng & Units 08/21/2021  Labs for ITP Cardiac and Pulmonary Rehab  Bicarbonate 20.0 - 28.0 mmol/L 27.7   O2 Saturation % 85.6      Pulmonary Assessment Scores:  Pulmonary Assessment Scores     Row Name 12/04/22 1623         ADL UCSD   ADL Phase Entry     SOB Score total 63     Rest 0     Walk 3     Stairs 4     Bath 3     Dress 3     Shop 2       CAT Score   CAT Score 33       mMRC Score   mMRC Score 3              UCSD: Self-administered rating of dyspnea associated with activities of daily living (ADLs) 6-point scale (0 = "not at all" to 5 = "maximal or unable to do because of breathlessness")  Scoring Scores range from 0 to 120.  Minimally important difference is 5 units  CAT: CAT can identify the health impairment of COPD patients and is better correlated with disease progression.  CAT has a scoring range of zero to 40. The CAT score is classified into four groups of low (less than 10), medium (10 - 20), high (21-30) and very high (31-40) based on the impact level of disease on health status. A CAT score over 10 suggests significant symptoms.  A worsening CAT score could be explained by an exacerbation, poor medication adherence, poor inhaler technique, or progression of COPD or comorbid conditions.  CAT MCID is 2 points  mMRC: mMRC (Modified Medical Research Council) Dyspnea Scale is used to assess the degree of baseline functional disability in patients of respiratory disease due to dyspnea. No minimal important difference is established. A decrease in score of 1 point or greater is considered a positive change.   Pulmonary Function  Assessment:  Pulmonary Function Assessment - 12/04/22 1625       Pulmonary Function Tests   FVC% 83 %    FEV1% 38 %    FEV1/FVC Ratio 34.64             Exercise Target Goals: Exercise Program Goal: Individual exercise prescription set using results from initial 6 min walk test and THRR while considering  patient's activity barriers and safety.   Exercise Prescription  Goal: Initial exercise prescription builds to 30-45 minutes a day of aerobic activity, 2-3 days per week.  Home exercise guidelines will be given to patient during program as part of exercise prescription that the participant will acknowledge.  Education: Aerobic Exercise: - Group verbal and visual presentation on the components of exercise prescription. Introduces F.I.T.T principle from ACSM for exercise prescriptions.  Reviews F.I.T.T. principles of aerobic exercise including progression. Written material given at graduation. Flowsheet Row Pulmonary Rehab from 02/21/2023 in [facility] Cardiac and Pulmonary Rehab  Date 01/24/23  Educator [facility]  Instruction Review Code 1- [employer] Understanding       Education: Resistance Exercise: - Group verbal and visual presentation on the components of exercise prescription. Introduces F.I.T.T principle from ACSM for exercise prescriptions  Reviews F.I.T.T. principles of resistance exercise including progression. Written material given at graduation. Flowsheet Row Pulmonary Rehab from 02/21/2023 in [facility] Cardiac and Pulmonary Rehab  Date 01/15/23  Educator NT  Instruction Review Code 1- Verbalizes Understanding        Education: Exercise & Equipment Safety: - Individual verbal instruction and demonstration of equipment use and safety with use of the equipment. Flowsheet Row Pulmonary Rehab from 02/21/2023 in [facility] Cardiac and Pulmonary Rehab  Date 12/04/22  Educator [facility]  Instruction Review Code 1- Verbalizes Understanding       Education: Exercise Physiology & General Exercise  Guidelines: - Group verbal and written instruction with models to review the exercise physiology of the cardiovascular system and associated critical values. Provides general exercise guidelines with specific guidelines to those with heart or lung disease.  Flowsheet Row Pulmonary Rehab from 02/21/2023 in [facility] Cardiac and Pulmonary Rehab  Date 01/08/23  Educator [facility]  Instruction Review Code 1- [employer] Understanding       Education: Flexibility, Balance, Mind/Body Relaxation: - Group verbal and visual presentation with interactive activity on the components of exercise prescription. Introduces F.I.T.T principle from ACSM for exercise prescriptions. Reviews F.I.T.T. principles of flexibility and balance exercise training including progression. Also discusses the mind body connection.  Reviews various relaxation techniques to help reduce and manage stress (i.e. Deep breathing, progressive muscle relaxation, and visualization). Balance handout provided to take home. Written material given at graduation. Flowsheet Row Pulmonary Rehab from 02/21/2023 in [facility] Cardiac and Pulmonary Rehab  Date 01/15/23  Educator NT  Instruction Review Code 1- Verbalizes Understanding       Activity Barriers & Risk Stratification:  Activity Barriers & Cardiac Risk Stratification - 12/04/22 1611       Activity Barriers & Cardiac Risk Stratification   Activity Barriers Other (comment)    Comments lower back pain occationally (no history of injury)             6 Minute Walk:  6 Minute Walk     Row Name 12/04/22 1606         6 Minute Walk   Phase Initial     Distance 790 feet     Walk Time 6 minutes     # of Rest Breaks 0     MPH 1.5     METS 2.4     RPE 13     Perceived Dyspnea  3     VO2 Peak 8.4     Symptoms Yes (comment)     Comments SOB 3     Resting HR 96 bpm     Resting BP 122/82     Resting Oxygen Saturation  90 %     Exercise Oxygen Saturation  during 6 min walk 78 %     Max Ex. HR  144 bpm     Max Ex. BP 150/80     2 Minute Post BP 124/80       Interval HR   1 Minute HR 100     2 Minute HR 122     3 Minute HR 144     4 Minute HR 141     5 Minute HR 140     6 Minute HR 142     2 Minute Post HR 112     Interval Heart Rate? Yes       Interval Oxygen   Interval Oxygen? Yes     Baseline Oxygen Saturation % 90 %     1 Minute Oxygen Saturation % 86 %     1 Minute Liters of Oxygen 0 L     2 Minute Oxygen Saturation % 85 %     2 Minute Liters of Oxygen 0 L     3 Minute Oxygen Saturation % 81 %     3 Minute Liters of Oxygen 0 L     4 Minute Oxygen Saturation % 80 %     4 Minute Liters of Oxygen 0 L     5 Minute Oxygen Saturation % 80 %     5 Minute Liters of Oxygen 0 L     6 Minute Oxygen Saturation % 78 %     6 Minute Liters of Oxygen 0 L     2 Minute Post Oxygen Saturation % 90 %     2 Minute Post Liters of Oxygen 0 L             Oxygen Initial Assessment:  Oxygen Initial Assessment - 12/04/22 1625       Home Oxygen   Home Oxygen Device Home Concentrator;E-Tanks;Portable Concentrator    Sleep Oxygen Prescription Continuous    Liters per minute 2    Home Exercise Oxygen Prescription Pulsed   as needed   Liters per minute 2    Home Resting Oxygen Prescription Pulsed   as needed   Liters per minute 2    Compliance with Home Oxygen Use Yes   Patient uses O2 at night. She reports that she does not use her portable consentrator but she was encouraged to monitor her SaO2 at home and use her pulsed O2 if her SaO2 drops below 88% during the day b/c her SaO2 dropped significantly during her .     Initial 6 min Walk   Oxygen Used None      Program Oxygen Prescription   Program Oxygen Prescription Continuous    Liters per minute 2    Comments Patient's SaO2 dropped significantly during her . Lowest reading was 78%. Will use continuous O2 as needed up to 2 L to maintain SaO2 >88% during exercise.      Intervention   Short Term Goals To learn and  exhibit compliance with exercise, home and travel O2 prescription;To learn and understand importance of monitoring SPO2 with pulse oximeter and demonstrate accurate use of the pulse oximeter.;To learn and understand importance of maintaining oxygen saturations>88%;To learn and demonstrate proper pursed lip breathing techniques or other breathing techniques. ;To learn and demonstrate proper use of respiratory medications    Long  Term Goals Exhibits compliance with exercise, home  and travel O2 prescription;Verbalizes importance of monitoring SPO2 with pulse oximeter and return demonstration;Maintenance of O2 saturations>88%;Exhibits proper breathing techniques, such as  pursed lip breathing or other method taught during program session;Compliance with respiratory medication;Demonstrates proper use of MDI's             Oxygen Re-Evaluation:  Oxygen Re-Evaluation     Row Name 12/21/22 1408 02/05/23 1359           Program Oxygen Prescription   Program Oxygen Prescription Continuous Continuous      Liters per minute 2 2        Home Oxygen   Home Oxygen Device Home Concentrator;E-Tanks;Portable Concentrator Home Concentrator;E-Tanks;Portable Concentrator      Sleep Oxygen Prescription Continuous Continuous      Liters per minute 2 2      Home Exercise Oxygen Prescription Pulsed  PRN Pulsed      Liters per minute 2 2      Home Resting Oxygen Prescription Pulsed Pulsed      Liters per minute 2 2      Compliance with Home Oxygen Use Yes Yes        Goals/Expected Outcomes   Short Term Goals To learn and demonstrate proper pursed lip breathing techniques or other breathing techniques.  To learn and understand importance of maintaining oxygen saturations>88%;To learn and understand importance of monitoring SPO2 with pulse oximeter and demonstrate accurate use of the pulse oximeter.      Long  Term Goals Exhibits proper breathing techniques, such as pursed lip breathing or other method taught  during program session Maintenance of O2 saturations>88%;Verbalizes importance of monitoring SPO2 with pulse oximeter and return demonstration      Comments Informed patient how to perform the Pursed Lipped breathing technique. Told patient to Inhale through the nose and out the mouth with pursed lips to keep their airways open, help oxygenate them better, practice when at rest or doing strenuous activity. Patient Verbalizes understanding of technique and will work on and be reiterated during [facility]. She has a pulse oximeter to check her oxygen saturation at home. Informed her where to get one and explained why it is important to have one. Reviewed that oxygen saturations should be 88 percent and above. Patient verbalizes understanding.      Goals/Expected Outcomes Short: use PLB with exertion. Long: use PLB on exertion proficiently and independently. Short: monitor oxygen at home with exertion. Long: maintain oxygen saturations above 88 percent independently.               Oxygen Discharge (Final Oxygen Re-Evaluation):  Oxygen Re-Evaluation - 02/05/23 1359       Program Oxygen Prescription   Program Oxygen Prescription Continuous    Liters per minute 2      Home Oxygen   Home Oxygen Device Home Concentrator;E-Tanks;Portable Concentrator    Sleep Oxygen Prescription Continuous    Liters per minute 2    Home Exercise Oxygen Prescription Pulsed    Liters per minute 2    Home Resting Oxygen Prescription Pulsed    Liters per minute 2    Compliance with Home Oxygen Use Yes      Goals/Expected Outcomes   Short Term Goals To learn and understand importance of maintaining oxygen saturations>88%;To learn and understand importance of monitoring SPO2 with pulse oximeter and demonstrate accurate use of the pulse oximeter.    Long  Term Goals Maintenance of O2 saturations>88%;Verbalizes importance of monitoring SPO2 with pulse oximeter and return demonstration    Comments She has a pulse  oximeter to check her oxygen saturation at home. Informed her where to get one and  explained why it is important to have one. Reviewed that oxygen saturations should be 88 percent and above. Patient verbalizes understanding.    Goals/Expected Outcomes Short: monitor oxygen at home with exertion. Long: maintain oxygen saturations above 88 percent independently.             Initial Exercise Prescription:  Initial Exercise Prescription - 12/04/22 1600       Date of Initial Exercise RX and Referring Provider   Date 12/04/22    Referring Provider Dr. Inda Coke      Oxygen   Oxygen Continuous    Liters 2   use supplemental O2 as needed up to 2 L to maintain SaO2 >88%   Maintain Oxygen Saturation 88% or higher      Treadmill   MPH 1.3    Grade 1    Minutes 15    METs 2.17      NuStep   Level 1   T6   SPM 80    Minutes 15    METs 2.4      REL-XR   Level 1    Speed 50    Minutes 15    METs 2.4      Biostep-RELP   Level 1    SPM 50    Minutes 15    METs 2.4      Prescription Details   Frequency (times per week) 3    Duration Progress to 30 minutes of continuous aerobic without signs/symptoms of physical distress      Intensity   THRR 40-80% of Max Heartrate 116-140    Ratings of Perceived Exertion 11-13    Perceived Dyspnea 0-4      Progression   Progression Continue to progress workloads to maintain intensity without signs/symptoms of physical distress.      Resistance Training   Training Prescription Yes    Weight 1    Reps 10-15             Perform Capillary Blood Glucose checks as needed.  Exercise Prescription Changes:   Exercise Prescription Changes     Row Name 12/04/22 1600 12/28/22 1600 01/09/23 0700 01/24/23 1400 02/07/23 1400     Response to Exercise   Blood Pressure (Admit) 122/82 128/72 118/64 132/70 124/60   Blood Pressure (Exercise) 150/80 138/64 138/72 122/56 --   Blood Pressure (Exit) 124/80 112/60 120/64 122/80 134/68    Heart Rate (Admit) 96 bpm 92 bpm 79 bpm 97 bpm 94 bpm   Heart Rate (Exercise) 144 bpm 130 bpm 141 bpm 119 bpm 123 bpm   Heart Rate (Exit) 98 bpm 106 bpm 105 bpm 90 bpm 98 bpm   Oxygen Saturation (Admit) 90 % 90 % 93 % 89 % 93 %   Oxygen Saturation (Exercise) 78 % 88 % 88 % 88 % 89 %   Oxygen Saturation (Exit) 90 % 92 % 93 % 95 % 91 %   Rating of Perceived Exertion (Exercise) 13 15 15 14 15    Perceived Dyspnea (Exercise) 3 2 3.5 2 3    Symptoms SOB 3 3 SOB none none   Comments 6 MWT results First three days of exercise -- -- --   Duration -- Progress to 30 minutes of  aerobic without signs/symptoms of physical distress Progress to 30 minutes of  aerobic without signs/symptoms of physical distress Progress to 30 minutes of  aerobic without signs/symptoms of physical distress Progress to 30 minutes of  aerobic without signs/symptoms of physical distress  Intensity -- THRR unchanged THRR unchanged THRR unchanged THRR unchanged     Progression   Progression -- Continue to progress workloads to maintain intensity without signs/symptoms of physical distress. Continue to progress workloads to maintain intensity without signs/symptoms of physical distress. Continue to progress workloads to maintain intensity without signs/symptoms of physical distress. Continue to progress workloads to maintain intensity without signs/symptoms of physical distress.   Average METs -- 1.98 1.95 1.97 1.98     Resistance Training   Training Prescription -- Yes Yes Yes Yes   Weight -- 2 lb 2 lb 2 lb 2 lb   Reps -- 10-15 10-15 10-15 10-15     Interval Training   Interval Training -- No No No No     Oxygen   Oxygen -- Continuous Continuous Continuous Continuous   Liters -- 2  use supplemental O2 as needed up to 2 L to maintain SaO2 >88% 2  use supplemental O2 as needed up to 2 L to maintain SaO2 >88% 2 2     Treadmill   MPH -- 1.3 1.5 1.4 1.5   Grade -- 0 0 0 0   Minutes -- 15 15 15 15    METs -- 1.99 2.15 2.07 2.15      NuStep   Level -- -- 2  T6 nustep 2  T6 1  T6   Minutes -- -- 15 15 15    METs -- -- 1.7 1.6 1.7     REL-XR   Level -- 1 1 1 1    Minutes -- 15 15 15 15    METs -- -- 2.4 2.9 --     Biostep-RELP   Level -- 1 1 -- 2   Minutes -- 15 15 -- 15   METs -- 2 2 -- --     Oxygen   Maintain Oxygen Saturation -- 88% or higher 88% or higher 88% or higher --    Row Name 02/08/23 1400 02/20/23 0800           Response to Exercise   Blood Pressure (Admit) -- 106/60      Blood Pressure (Exit) -- 104/66      Heart Rate (Admit) -- 80 bpm      Heart Rate (Exercise) -- 126 bpm      Heart Rate (Exit) -- 90 bpm      Oxygen Saturation (Admit) -- 92 %      Oxygen Saturation (Exercise) -- 89 %      Oxygen Saturation (Exit) -- 94 %      Rating of Perceived Exertion (Exercise) -- 15      Perceived Dyspnea (Exercise) -- 3      Symptoms -- none      Duration -- Progress to 30 minutes of  aerobic without signs/symptoms of physical distress      Intensity -- THRR unchanged        Progression   Progression -- Continue to progress workloads to maintain intensity without signs/symptoms of physical distress.      Average METs -- 1.98        Resistance Training   Training Prescription -- Yes      Weight -- 2 lb      Reps -- 10-15        Interval Training   Interval Training -- No        Oxygen   Oxygen -- Continuous      Liters -- 2        Treadmill  MPH -- 1.5      Grade -- 0      Minutes -- 15      METs -- 2.15        NuStep   Level -- 3  T6      Minutes -- 15      METs -- 1.8        REL-XR   Level -- 1      Minutes -- 15        Biostep-RELP   Level -- 2      Minutes -- 15      METs -- 2        Home Exercise Plan   Plans to continue exercise at Home (comment)  Brianna Briggs plans to [facility] outside for home exercise once she gets her roller. She was also considering joining a gym to continue exercise. I informed her about the [employer]. Home (comment)  Brianna Briggs plans to [facility] outside  for home exercise once she gets her roller. She was also considering joining a gym to continue exercise. I informed her about the [employer].      Frequency Add 1 additional day to program exercise sessions. Add 1 additional day to program exercise sessions.      Initial Home Exercises Provided 02/08/23 02/08/23        Oxygen   Maintain Oxygen Saturation 88% or higher 88% or higher               Exercise Comments:   Exercise Comments     Row Name 12/18/22 1350           Exercise Comments First full day of exercise!  Patient was oriented to gym and equipment including functions, settings, policies, and procedures.  Patient's individual exercise prescription and treatment plan were reviewed.  All starting workloads were established based on the results of the 6 minute walk test done at initial orientation visit.  The plan for exercise progression was also introduced and progression will be customized based on patient's performance and goals.                Exercise Goals and Review:   Exercise Goals     Row Name 12/04/22 1619             Exercise Goals   Increase Physical Activity Yes       Intervention Provide advice, education, support and counseling about physical activity/exercise needs.;Develop an individualized exercise prescription for aerobic and resistive training based on initial evaluation findings, risk stratification, comorbidities and participant's personal goals.       Expected Outcomes Short Term: Attend rehab on a regular basis to increase amount of physical activity.;Long Term: Add in home exercise to make exercise part of routine and to increase amount of physical activity.;Long Term: Exercising regularly at least 3-5 days a week.       Increase Strength and Stamina Yes       Intervention Provide advice, education, support and counseling about physical activity/exercise needs.;Develop an individualized exercise prescription for aerobic and resistive  training based on initial evaluation findings, risk stratification, comorbidities and participant's personal goals.       Expected Outcomes Short Term: Increase workloads from initial exercise prescription for resistance, speed, and METs.;Short Term: Perform resistance training exercises routinely during rehab and add in resistance training at home;Long Term: Improve cardiorespiratory fitness, muscular endurance and strength as measured by increased METs and functional capacity ( )       Able  to understand and use rate of perceived exertion (RPE) scale Yes       Intervention Provide education and explanation on how to use RPE scale       Expected Outcomes Short Term: Able to use RPE daily in rehab to express subjective intensity level;Long Term:  Able to use RPE to guide intensity level when exercising independently       Able to understand and use Dyspnea scale Yes       Intervention Provide education and explanation on how to use Dyspnea scale       Expected Outcomes Short Term: Able to use Dyspnea scale daily in rehab to express subjective sense of shortness of breath during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 Factors/Patient Goals on Admission    Weight Management Yes;Weight Loss    Intervention Weight Management: Develop a combined nutrition and exercise program designed to reach desired caloric intake, while maintaining appropriate intake of nutrient and fiber, sodium and fats, and appropriate energy expenditure required for the weight goal.;Weight Management: Provide education and appropriate resources to help participant work on and attain dietary goals.;Weight Management/Obesity: Establish reasonable short term and long term weight goals.    Admit Weight 148 lb 1.6 oz (67.2 kg)    Goal Weight: Short Term 143 lb (64.9 kg)    Goal Weight: Long Term 135 lb (61.2 kg)    Expected Outcomes Short Term: Continue to assess and modify interventions until short term weight is achieved;Long Term: Adherence to nutrition and physical activity/exercise program aimed toward attainment of established weight goal;Weight Loss: Understanding of general recommendations for a balanced deficit meal plan, which promotes 1-2 lb weight loss per week and includes a negative energy balance of [phone] kcal/d;Understanding recommendations for meals to include 15-35% energy as protein, 25-35% energy from fat, 35-60% energy from carbohydrates, less than 200mg  of dietary cholesterol, 20-35 gm of total fiber daily;Understanding of distribution of calorie intake throughout the day with the consumption of 4-5 meals/snacks    Improve shortness of breath with ADL's Yes    Intervention Provide education, individualized exercise plan and daily activity instruction to help decrease symptoms of SOB with activities of daily living.    Expected Outcomes Short Term: Improve cardiorespiratory fitness to achieve a reduction of symptoms when performing ADLs;Long Term: Be able to perform more ADLs without symptoms or delay the onset of symptoms    Lipids Yes    Intervention Provide education and support for participant on  nutrition & aerobic/resistive exercise along with prescribed medications to achieve LDL 70mg , HDL >40mg .    Expected Outcomes Short Term: Participant states understanding of desired cholesterol values and is compliant with medications prescribed. Participant is following exercise prescription and nutrition guidelines.;Long Term: Cholesterol controlled with medications as prescribed, with individualized exercise RX and with personalized nutrition plan. Value goals: LDL < 70mg , HDL > 40 mg.             Education:Diabetes - Individual verbal and written instruction to review signs/symptoms of diabetes, desired ranges of glucose level fasting, after meals and with exercise. Acknowledge that pre and post exercise glucose checks will be done for 3 sessions at entry of program.   Know Your Numbers and Heart Failure: - Group verbal and visual instruction to discuss disease risk factors for cardiac and pulmonary disease and treatment options.  Reviews associated critical values for Overweight/Obesity, Hypertension, Cholesterol, and Diabetes.  Discusses basics of heart failure: signs/symptoms and treatments.  Introduces Heart Failure Zone chart for action plan for heart failure.  Written material given at  graduation.   Core Components/Risk Factors/Patient Goals Review:   Goals and Risk Factor Review     Row Name 12/21/22 1353 01/18/23 1422 02/05/23 1404         Core Components/Risk Factors/Patient Goals Review   Personal Goals Review Improve shortness of breath with ADL's Improve shortness of breath with ADL's Improve shortness of breath with ADL's     Review Spoke to patient about their shortness of breath and what they can do to improve. Patient has been informed of breathing techniques when starting the program. Patient is informed to tell staff if they have had any med changes and that certain meds they are taking or not taking can be causing shortness of breath. Dalyce states that she  understands the breathing techniques, and hopes that attending rehab and working hard will improve that. She states that her current living situation coupled with her shortness of breathe makes daily activities harder for her. Brianna Briggs has purchased a walker and is going to start walking at home. Now that she has her walker he will feel more confident in walking outside. She wants her shortness of breath to get better so she can do other hobbies and activities she enjoys.     Expected Outcomes Short: Attend [facility] regularly to improve shortness of breath with ADL's. Long: maintain independence with ADL's Short: Attend [facility] regularly to improve shortness of breath with ADL's. Long: maintain independence with ADL's Short: improve walking and get walker to start walking at home. Long: maintain walking at home independently and use walker.              Core Components/Risk Factors/Patient Goals at Discharge (Final Review):   Goals and Risk Factor Review - 02/05/23 1404       Core Components/Risk Factors/Patient Goals Review   Personal Goals Review Improve shortness of breath with ADL's    Review Brianna Briggs has purchased a walker and is going to start walking at home. Now that she has her walker he will feel more confident in walking outside. She wants her shortness of breath to get better so she can do other hobbies and activities she enjoys.    Expected Outcomes Short: improve walking and get walker to start walking at home. Long: maintain walking at home independently and use walker.             ITP Comments:  ITP Comments     Row Name 10/23/22 1112 12/04/22 1604 12/06/22 1120 12/18/22 1350 01/03/23 0934   ITP Comments Virtual Visit completed. Patient informed on EP and RD appointment and 6 Minute walk test. Patient also informed of patient health questionnaires on My Chart. Patient Verbalizes understanding. Visit diagnosis can be found in [facility] 09/06/2022. Completed and gym orientation.  Initial ITP created and sent for review to Dr. Jinny Sanders, Medical Director. 30 Day review completed. Medical Director ITP review done, changes made as directed, and signed approval by Medical Director.    new to program First full day of exercise!  Patient was oriented to gym and equipment including functions, settings, policies, and procedures.  Patient's individual exercise prescription and treatment plan were reviewed.  All starting workloads were established based on the results of the 6 minute walk test done at initial orientation visit.  The plan for exercise progression was also introduced and progression will be customized based on patient's performance and goals. 30 Day review completed. Medical Director ITP review done, changes made as directed, and signed approval by Medical Director.  new to program    Row Name 01/31/23 0738 02/28/23 0721         ITP Comments 30 Day review completed. Medical Director ITP review done, changes made as directed, and signed approval by Medical Director. 30 Day review completed. Medical Director ITP review done, changes made as directed, and signed approval by Medical Director.               Comments:

## 2023-03-01 ENCOUNTER — Encounter: Payer: Medicare Other | Admitting: *Deleted

## 2023-03-01 VITALS — Ht 62.0 in | Wt 150.8 lb

## 2023-03-01 DIAGNOSIS — J449 Chronic obstructive pulmonary disease, unspecified: Secondary | ICD-10-CM

## 2023-03-01 NOTE — Progress Notes (Signed)
Daily Session Note  Patient Details  Name: Brianna Briggs MRN: [id number] Date of Birth: [birth year] Referring Provider:   Flowsheet Row Pulmonary Rehab from 12/04/2022 in [facility] Cardiac and Pulmonary Rehab  Referring Provider Dr. Inda Coke       Encounter Date: 03/01/2023  Check In:  Session Check In - 03/01/23 1350       Check-In   Supervising physician immediately available to respond to emergencies See telemetry face sheet for immediately available ER MD    Location [facility]-Cardiac & Pulmonary Rehab    Staff Present Maxon Suzzette Righter, Exercise Physiologist;Danica Camarena Jewel Baize RN,BSN;Joseph Hood RCP,RRT,BSRT;Noah Tickle, [place], Exercise Physiologist    Virtual Visit No    Medication changes reported     No    Fall or balance concerns reported    No    Warm-up and Cool-down Performed on first and last piece of equipment    Resistance Training Performed Yes    VAD Patient? No    PAD/SET Patient? No      Pain Assessment   Currently in Pain? No/denies                Social History   Tobacco Use  Smoking Status Former   Current packs/day: 0.00   Average packs/day: 1.5 packs/day for 40.0 years (60.0 ttl pk-yrs)   Types: Cigarettes   Start date: 07/1981   Quit date: 07/2021   Years since quitting: 1.6  Smokeless Tobacco Never    Goals Met:  Independence with exercise equipment Exercise tolerated well No report of concerns or symptoms today Strength training completed today  Goals Unmet:  Not Applicable  Comments: Pt able to follow exercise prescription today without complaint.  Will continue to monitor for progression.    Dr. Bethann Punches is Medical Director for [facility] Cardiac Rehabilitation.  Dr. Vida Rigger is Medical Director for [facility] Pulmonary Rehabilitation.

## 2023-03-01 NOTE — Patient Instructions (Signed)
Discharge Patient Instructions  Patient Details  Name: Brianna Briggs MRN: [id number] Date of Birth: [birth year] Referring Provider:  Lauro Regulus, MD   Number of Visits: 36  Reason for Discharge:  Patient reached a stable level of exercise. Patient independent in their exercise. Patient has met program and personal goals.   Diagnosis:  Chronic obstructive pulmonary disease, unspecified COPD type (HCC)  Initial Exercise Prescription:  Initial Exercise Prescription - 12/04/22 1600       Date of Initial Exercise RX and Referring Provider   Date 12/04/22    Referring Provider Dr. Inda Coke      Oxygen   Oxygen Continuous    Liters 2   use supplemental O2 as needed up to 2 L to maintain SaO2 >88%   Maintain Oxygen Saturation 88% or higher      Treadmill   MPH 1.3    Grade 1    Minutes 15    METs 2.17      NuStep   Level 1   T6   SPM 80    Minutes 15    METs 2.4      REL-XR   Level 1    Speed 50    Minutes 15    METs 2.4      Biostep-RELP   Level 1    SPM 50    Minutes 15    METs 2.4      Prescription Details   Frequency (times per week) 3    Duration Progress to 30 minutes of continuous aerobic without signs/symptoms of physical distress      Intensity   THRR 40-80% of Max Heartrate 116-140    Ratings of Perceived Exertion 11-13    Perceived Dyspnea 0-4      Progression   Progression Continue to progress workloads to maintain intensity without signs/symptoms of physical distress.      Resistance Training   Training Prescription Yes    Weight 1    Reps 10-15             Discharge Exercise Prescription (Final Exercise Prescription Changes):  Exercise Prescription Changes - 02/20/23 0800       Response to Exercise   Blood Pressure (Admit) 106/60    Blood Pressure (Exit) 104/66    Heart Rate (Admit) 80 bpm    Heart Rate (Exercise) 126 bpm    Heart Rate (Exit) 90 bpm    Oxygen Saturation (Admit) 92 %    Oxygen Saturation  (Exercise) 89 %    Oxygen Saturation (Exit) 94 %    Rating of Perceived Exertion (Exercise) 15    Perceived Dyspnea (Exercise) 3    Symptoms none    Duration Progress to 30 minutes of  aerobic without signs/symptoms of physical distress    Intensity THRR unchanged      Progression   Progression Continue to progress workloads to maintain intensity without signs/symptoms of physical distress.    Average METs 1.98      Resistance Training   Training Prescription Yes    Weight 2 lb    Reps 10-15      Interval Training   Interval Training No      Oxygen   Oxygen Continuous    Liters 2      Treadmill   MPH 1.5    Grade 0    Minutes 15    METs 2.15      NuStep   Level 3   T6  Minutes 15    METs 1.8      REL-XR   Level 1    Minutes 15      Biostep-RELP   Level 2    Minutes 15    METs 2      Home Exercise Plan   Plans to continue exercise at Home (comment)   Brianna Briggs plans to [facility] outside for home exercise once she gets her roller. She was also considering joining a gym to continue exercise. I informed her about the [facility].   Frequency Add 1 additional day to program exercise sessions.    Initial Home Exercises Provided 02/08/23      Oxygen   Maintain Oxygen Saturation 88% or higher             Functional Capacity:  6 Minute Walk     Row Name 12/04/22 1606 03/01/23 1405       6 Minute Walk   Phase Initial Discharge    Distance 790 feet 815 feet    Distance % Change -- 3.16 %    Distance Feet Change -- 25 ft    Walk Time 6 minutes 6 minutes    # of Rest Breaks 0 0    MPH 1.5 1.54    METS 2.4 2.1    RPE 13 15    Perceived Dyspnea  3 3    VO2 Peak 8.4 7.2    Symptoms Yes (comment) Yes (comment)    Comments SOB 3 Severe shortness of breathe    Resting HR 96 bpm 94 bpm    Resting BP 122/82 112/68    Resting Oxygen Saturation  90 % 94 %    Exercise Oxygen Saturation  during 6 min walk 78 % 86 %    Max Ex. HR 144 bpm 128 bpm    Max Ex. BP 150/80  132/70    2 Minute Post BP 124/80 114/68      Interval HR   1 Minute HR 100 110    2 Minute HR 122 112    3 Minute HR 144 117    4 Minute HR 141 112    5 Minute HR 140 102    6 Minute HR 142 128    2 Minute Post HR 112 108    Interval Heart Rate? Yes Yes      Interval Oxygen   Interval Oxygen? Yes Yes    Baseline Oxygen Saturation % 90 % 94 %    1 Minute Oxygen Saturation % 86 % 88 %    1 Minute Liters of Oxygen 0 L 2 L    2 Minute Oxygen Saturation % 85 % 89 %    2 Minute Liters of Oxygen 0 L 2 L    3 Minute Oxygen Saturation % 81 % 89 %    3 Minute Liters of Oxygen 0 L 2 L    4 Minute Oxygen Saturation % 80 % 88 %    4 Minute Liters of Oxygen 0 L 2 L    5 Minute Oxygen Saturation % 80 % 88 %    5 Minute Liters of Oxygen 0 L 2 L    6 Minute Oxygen Saturation % 78 % 86 %    6 Minute Liters of Oxygen 0 L 2 L    2 Minute Post Oxygen Saturation % 90 % 95 %    2 Minute Post Liters of Oxygen 0 L 2 L  Nutrition & Weight - Outcomes:  Pre Biometrics - 12/04/22 1620       Pre Biometrics   Height 5\' 2"  (1.575 m)    Weight 148 lb 1.6 oz (67.2 kg)    Waist Circumference 37 inches    Hip Circumference 42 inches    Waist to Hip Ratio 0.88 %    BMI (Calculated) 27.08    Single Leg Stand 10.75 seconds             Post Biometrics - 03/01/23 1409        Post  Biometrics   Height 5\' 2"  (1.575 m)    Weight 150 lb 12.8 oz (68.4 kg)    Waist Circumference 40 inches    Hip Circumference 38.5 inches    Waist to Hip Ratio 1.04 %    BMI (Calculated) 27.57    Single Leg Stand 11.54 seconds             Goals reviewed with patient; copy given to patient.

## 2023-03-05 ENCOUNTER — Encounter: Payer: Medicare Other | Admitting: *Deleted

## 2023-03-05 DIAGNOSIS — J449 Chronic obstructive pulmonary disease, unspecified: Secondary | ICD-10-CM | POA: Diagnosis not present

## 2023-03-05 NOTE — Progress Notes (Signed)
 Daily Session Note  Patient Details  Name: Brianna Briggs MRN: [id number] Date of Birth: 1950-04-15 Referring Provider:   Flowsheet Row Pulmonary Rehab from 12/04/2022 in [facility] Cardiac and Pulmonary Rehab  Referring Provider Dr. Inda Coke       Encounter Date: 03/05/2023  Check In:  Session Check In - 03/05/23 1354       Check-In   Supervising physician immediately available to respond to emergencies See telemetry face sheet for immediately available ER MD    Location [facility]-Cardiac & Pulmonary Rehab    Staff Present Elige Ko RCP,RRT,BSRT;Kadarius Cuffe Jewel Baize RN,BSN;Maxon Conetta BS, Exercise Physiologist;Noah Tickle, BS, Exercise Physiologist    Virtual Visit No    Medication changes reported     No    Fall or balance concerns reported    No    Warm-up and Cool-down Performed on first and last piece of equipment    Resistance Training Performed Yes    VAD Patient? No    PAD/SET Patient? No      Pain Assessment   Currently in Pain? No/denies                Social History   Tobacco Use  Smoking Status Former   Current packs/day: 0.00   Average packs/day: 1.5 packs/day for 40.0 years (60.0 ttl pk-yrs)   Types: Cigarettes   Start date: 07/1981   Quit date: 07/2021   Years since quitting: 1.6  Smokeless Tobacco Never    Goals Met:  Independence with exercise equipment Exercise tolerated well No report of concerns or symptoms today Strength training completed today  Goals Unmet:  Not Applicable  Comments: Pt able to follow exercise prescription today without complaint.  Will continue to monitor for progression.    Dr. Bethann Punches is Medical Director for [facility] Cardiac Rehabilitation.  Dr. Vida Rigger is Medical Director for [facility] Pulmonary Rehabilitation.

## 2023-03-06 ENCOUNTER — Encounter: Payer: Medicare Other | Admitting: *Deleted

## 2023-03-06 DIAGNOSIS — J449 Chronic obstructive pulmonary disease, unspecified: Secondary | ICD-10-CM | POA: Diagnosis not present

## 2023-03-06 NOTE — Progress Notes (Signed)
 Daily Session Note  Patient Details  Name: Brianna Briggs MRN: [id number] Date of Birth: 26-Jun-1950 Referring Provider:   Flowsheet Row Pulmonary Rehab from 12/04/2022 in [facility] Cardiac and Pulmonary Rehab  Referring Provider Dr. Inda Coke       Encounter Date: 03/06/2023  Check In:  Session Check In - 03/06/23 1401       Check-In   Supervising physician immediately available to respond to emergencies See telemetry face sheet for immediately available ER MD    Location [facility]-Cardiac & Pulmonary Rehab    Staff Present Cora Collum, RN, BSN, CCRP;Noah Tickle, BS, Exercise Physiologist;Joseph Hood RCP,RRT,BSRT    Virtual Visit No    Medication changes reported     No    Tobacco Cessation Use Decreased    Warm-up and Cool-down Performed on first and last piece of equipment    Resistance Training Performed Yes    VAD Patient? No    PAD/SET Patient? No      Pain Assessment   Currently in Pain? No/denies                Social History   Tobacco Use  Smoking Status Former   Current packs/day: 0.00   Average packs/day: 1.5 packs/day for 40.0 years (60.0 ttl pk-yrs)   Types: Cigarettes   Start date: 07/1981   Quit date: 07/2021   Years since quitting: 1.6  Smokeless Tobacco Never    Goals Met:  Proper associated with RPD/PD & O2 Sat Independence with exercise equipment Exercise tolerated well No report of concerns or symptoms today  Goals Unmet:  Not Applicable  Comments: Pt able to follow exercise prescription today without complaint.  Will continue to monitor for progression.    Dr. Bethann Punches is Medical Director for [facility] Cardiac Rehabilitation.  Dr. Vida Rigger is Medical Director for [facility] Pulmonary Rehabilitation.

## 2023-03-12 ENCOUNTER — Encounter: Payer: Medicare Other | Admitting: *Deleted

## 2023-03-12 ENCOUNTER — Other Ambulatory Visit: Payer: Self-pay | Admitting: Internal Medicine

## 2023-03-12 DIAGNOSIS — Z87891 Personal history of nicotine dependence: Secondary | ICD-10-CM

## 2023-03-12 DIAGNOSIS — J449 Chronic obstructive pulmonary disease, unspecified: Secondary | ICD-10-CM | POA: Diagnosis not present

## 2023-03-12 NOTE — Progress Notes (Signed)
 Daily Session Note  Patient Details  Name: Tyrese Ficek MRN: [id number] Date of Birth: 1950-05-10 Referring Provider:   Flowsheet Row Pulmonary Rehab from 12/04/2022 in [facility] Cardiac and Pulmonary Rehab  Referring Provider Dr. Inda Coke       Encounter Date: 03/12/2023  Check In:  Session Check In - 03/12/23 1344       Check-In   Supervising physician immediately available to respond to emergencies See telemetry face sheet for immediately available ER MD    Location [facility]-Cardiac & Pulmonary Rehab    Staff Present Susann Givens RN,BSN;Joseph [facility] BS, Exercise Physiologist;Noah Tickle, BS, Exercise Physiologist    Virtual Visit No    Medication changes reported     No    Fall or balance concerns reported    No    Warm-up and Cool-down Performed on first and last piece of equipment    Resistance Training Performed Yes    VAD Patient? No    PAD/SET Patient? No      Pain Assessment   Currently in Pain? No/denies                Social History   Tobacco Use  Smoking Status Former   Current packs/day: 0.00   Average packs/day: 1.5 packs/day for 40.0 years (60.0 ttl pk-yrs)   Types: Cigarettes   Start date: 07/1981   Quit date: 07/2021   Years since quitting: 1.6  Smokeless Tobacco Never    Goals Met:  Independence with exercise equipment Exercise tolerated well No report of concerns or symptoms today Strength training completed today  Goals Unmet:  Not Applicable  Comments: Pt able to follow exercise prescription today without complaint.  Will continue to monitor for progression.    Dr. Bethann Punches is Medical Director for [facility] Cardiac Rehabilitation.  Dr. Vida Rigger is Medical Director for [facility] Pulmonary Rehabilitation.

## 2023-03-14 ENCOUNTER — Encounter: Payer: Medicare Other | Admitting: *Deleted

## 2023-03-14 ENCOUNTER — Ambulatory Visit (INDEPENDENT_AMBULATORY_CARE_PROVIDER_SITE_OTHER): Payer: Medicare Other | Admitting: Dermatology

## 2023-03-14 ENCOUNTER — Encounter: Payer: Self-pay | Admitting: Dermatology

## 2023-03-14 DIAGNOSIS — L209 Atopic dermatitis, unspecified: Secondary | ICD-10-CM | POA: Diagnosis not present

## 2023-03-14 DIAGNOSIS — Z79899 Other long term (current) drug therapy: Secondary | ICD-10-CM | POA: Diagnosis not present

## 2023-03-14 DIAGNOSIS — L720 Epidermal cyst: Secondary | ICD-10-CM

## 2023-03-14 DIAGNOSIS — L2089 Other atopic dermatitis: Secondary | ICD-10-CM

## 2023-03-14 DIAGNOSIS — L729 Follicular cyst of the skin and subcutaneous tissue, unspecified: Secondary | ICD-10-CM

## 2023-03-14 DIAGNOSIS — Z7189 Other specified counseling: Secondary | ICD-10-CM

## 2023-03-14 DIAGNOSIS — J449 Chronic obstructive pulmonary disease, unspecified: Secondary | ICD-10-CM

## 2023-03-14 NOTE — Progress Notes (Signed)
   Follow-Up Visit   Subjective  Brianna Briggs is a 73 y.o. female who presents for the following: Atopic Dermatitis hands, L elbow, 53m f/u, Tacrolimus oint prn , Mometasone cr prn, uses 2 nights in a row then when flares again, check spot on back The patient has spots, moles and lesions to be evaluated, some may be new or changing and the patient may have concern these could be cancer.  The following portions of the chart were reviewed this encounter and updated as appropriate: medications, allergies, medical history  Review of Systems:  No other skin or systemic complaints except as noted in HPI or Assessment and Plan.  Objective  Well appearing patient in no apparent distress; mood and affect are within normal limits.   A focused examination was performed of the following areas: Hands, arms  Relevant exam findings are noted in the Assessment and Plan.   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 Condition is symptomatic/ bothersome to patient. Not currently at goal. Atopic dermatitis (eczema) is a chronic, relapsing, pruritic condition that can significantly affect quality of life. It is often associated with allergic rhinitis and/or asthma and can require treatment with topical medications, phototherapy, or in severe cases biologic injectable medication (Dupixent; Adbry) or Oral JAK inhibitors.  Treatment Plan: Discussed Dupixent again and patient declines.  Pt states her pulmonologists also wants her to start Dupixent for her COPD. Cont Tacrolimus 0.1% oint qd/bid aa eczema prn flares Cont Mometasone cr qd up to 3d/wk aa eczema prn flares  Recommend gentle skin care.   Topical steroids (such as triamcinolone, fluocinolone, fluocinonide, mometasone, clobetasol, halobetasol,  betamethasone, hydrocortisone) can cause thinning and lightening of the skin if they are used for too long in the same area. Your physician has selected the right strength medicine for your problem and area affected on the body. Please use your medication only as directed by your physician to prevent side effects.   Long term medication management.  Patient is using long term (months to years) prescription medication  to control their dermatologic condition.  These medications require periodic monitoring to evaluate for efficacy and side effects and may require periodic laboratory monitoring.  EPIDERMAL INCLUSION CYST - symptomatic R mid back paraspinal Exam: Subcutaneous nodule at R mid back paraspinal 3.0cm  Benign-appearing. Exam most consistent with an epidermal inclusion cyst. Discussed that a cyst is a benign growth that can grow over time and sometimes get irritated or inflamed. Recommend observation if it is not bothersome. Discussed option of surgical excision to remove it if it is growing, symptomatic, or other changes noted. Please call for new or changing lesions so they can be evaluated. OTHER ATOPIC DERMATITIS    Return for schedule with Dr. Katrinka Blazing surgery cyst back, 79m Atopic Derm Dr. Verdell Face, Ardis Rowan, RMA, am acting as scribe for Armida Sans, MD .   Documentation: I have reviewed the above documentation for accuracy and completeness, and I agree with the above.  Armida Sans, MD

## 2023-03-14 NOTE — Patient Instructions (Addendum)
 Pre-Operative Instructions  You are scheduled for a surgical procedure at [facility]. We recommend you read the following instructions. If you have any questions or concerns, please call the office at [phone].  Shower and wash the entire body with soap and water the day of your surgery paying special attention to cleansing at and around the planned surgery site.  Avoid aspirin or aspirin containing products at least fourteen (14) days prior to your surgical procedure and for at least one week (7 Days) after your surgical procedure. If you take aspirin on a regular basis for heart disease or history of stroke or for any other reason, we may recommend you continue taking aspirin but please notify us if you take this on a regular basis. Aspirin can cause more bleeding to occur during surgery as well as prolonged bleeding and bruising after surgery.   Avoid other nonsteroidal pain medications at least one week prior to surgery and at least one week prior to your surgery. These include medications such as Ibuprofen (Motrin, Advil and Nuprin), Naprosyn, Voltaren, Relafen, etc. If medications are used for therapeutic reasons, please inform us as they can cause increased bleeding or prolonged bleeding during and bruising after surgical procedures.   Please advise [place] if you are taking any "blood thinner" medications such as Coumadin or Dipyridamole or Plavix or similar medications. These cause increased bleeding and prolonged bleeding during procedures and bruising after surgical procedures. We may have to consider discontinuing these medications briefly prior to and shortly after your surgery if safe to do so.   Please inform us of all medications you are currently taking. All medications that are taken regularly should be taken the day of surgery as you always do. Nevertheless, we need to be informed of what medications you are taking prior to surgery to know whether they will affect the  procedure or cause any complications.   Please inform us of any medication allergies. Also inform us of whether you have allergies to Latex or rubber products or whether you have had any adverse reaction to Lidocaine or Epinephrine.  Please inform us of any prosthetic or artificial body parts such as artificial heart valve, joint replacements, etc., or similar condition that might require preoperative antibiotics.   We recommend avoidance of alcohol at least two weeks prior to surgery and continued avoidance for at least two weeks after surgery.   We recommend discontinuation of tobacco smoking at least two weeks prior to surgery and continued abstinence for at least two weeks after surgery.  Do not plan strenuous exercise, strenuous work or strenuous lifting for approximately four weeks after your surgery.   We request if you are unable to make your scheduled surgical appointment, please call us at least a week in advance or as soon as you are aware of a problem so that we can cancel or reschedule the appointment.   You MAY TAKE TYLENOL (acetaminophen) for pain as it is not a blood thinner.   PLEASE PLAN TO BE IN TOWN FOR TWO WEEKS FOLLOWING SURGERY, THIS IS IMPORTANT SO YOU CAN BE CHECKED FOR DRESSING CHANGES, SUTURE REMOVAL AND TO MONITOR FOR POSSIBLE COMPLICATIONS.      Tacrolimus 0.1% ointment can be used on eyelids for itching  Due to recent changes in healthcare laws, you may see results of your pathology and/or laboratory studies on MyChart before the doctors have had a chance to review them. We understand that in some cases there may be results that  are confusing or concerning to you. Please understand that not all results are received at the same time and often the doctors may need to interpret multiple results in order to provide you with the best plan of care or course of treatment. Therefore, we ask that you please give [place] 2 business days to thoroughly review all your results  before contacting the office for clarification. Should we see a critical lab result, you will be contacted sooner.   If You Need Anything After Your Visit  If you have any questions or concerns for your doctor, please call our main line at [phone] and press option 4 to reach your doctor's medical assistant. If no one answers, please leave a voicemail as directed and we will return your call as soon as possible. Messages left after 4 pm will be answered the following business day.   You may also send [place] a message via MyChart. We typically respond to MyChart messages within 1-2 business days.  For prescription refills, please ask your pharmacy to contact our office. Our fax number is [phone].  If you have an urgent issue when the clinic is closed that cannot wait until the next business day, you can page your doctor at the number below.    Please note that while we do our best to be available for urgent issues outside of office hours, we are not available 24/7.   If you have an urgent issue and are unable to reach [place], you may choose to seek medical care at your doctor's office, retail clinic, urgent care center, or emergency room.  If you have a medical emergency, please immediately call 911 or go to the emergency department.  Pager Numbers  - Dr. Gwen Pounds: [phone]  - Dr. Roseanne Reno: [phone]  - Dr. Katrinka Blazing: [phone]   In the event of inclement weather, please call our main line at [phone] for an update on the status of any delays or closures.  Dermatology Medication Tips: Please keep the boxes that topical medications come in in order to help keep track of the instructions about where and how to use these. Pharmacies typically print the medication instructions only on the boxes and not directly on the medication tubes.   If your medication is too expensive, please contact our office at [phone] option 4 or send [place] a message through MyChart.   We are unable  to tell what your co-pay for medications will be in advance as this is different depending on your insurance coverage. However, we may be able to find a substitute medication at lower cost or fill out paperwork to get insurance to cover a needed medication.   If a prior authorization is required to get your medication covered by your insurance company, please allow [place] 1-2 business days to complete this process.  Drug prices often vary depending on where the prescription is filled and some pharmacies may offer cheaper prices.  The website www.goodrx.com contains coupons for medications through different pharmacies. The prices here do not account for what the cost may be with help from insurance (it may be cheaper with your insurance), but the website can give you the price if you did not use any insurance.  - You can print the associated coupon and take it with your prescription to the pharmacy.  - You may also stop by our office during regular business hours and pick up a GoodRx coupon card.  - If you need your prescription sent electronically to a different pharmacy,  notify our office through [facility] or by phone at [phone] option 4.     Si Usted Necesita Algo Despus de Su Visita  Tambin puede enviarnos un mensaje a travs de Clinical cytogeneticist. Por lo general respondemos a los mensajes de MyChart en el transcurso de 1 a 2 das hbiles.  Para renovar recetas, por favor pida a su farmacia que se ponga en contacto con nuestra oficina. Annie Sable de fax es [facility] [phone].  Si tiene un asunto urgente cuando la clnica est cerrada y que no puede esperar hasta el siguiente da hbil, puede llamar/localizar a su doctor(a) al nmero que aparece a continuacin.   Por favor, tenga en cuenta que aunque hacemos todo lo posible para estar disponibles para asuntos urgentes fuera del horario de [place], no estamos disponibles las 24 horas del da, los 7 [address]  [address] de la [place].   Si tiene un  problema urgente y no puede comunicarse con nosotros, puede optar por buscar atencin mdica  en el consultorio de su doctor(a), en una clnica privada, en un centro de atencin urgente o en una sala de emergencias.  Si tiene Engineer, drilling, por favor llame inmediatamente al 911 o vaya a la sala de emergencias.  Nmeros de bper  - Dr. Gwen Pounds: [phone]  - Dra. Roseanne Reno: [phone]  - Dr. Katrinka Blazing: [phone]   En caso de inclemencias del tiempo, por favor llame a Lacy Duverney principal al [phone] para una actualizacin sobre el [place] de cualquier retraso o cierre.  Consejos para la medicacin en dermatologa: Por favor, guarde las cajas en las que vienen los medicamentos de uso tpico para ayudarle a seguir las instrucciones sobre dnde y cmo usarlos. Las farmacias generalmente imprimen las instrucciones del medicamento slo en las cajas y no directamente en los tubos del [place].   Si su medicamento es muy caro, por favor, pngase en contacto con Rolm Gala llamando al [phone] y presione la opcin 4 o envenos un mensaje a travs de Clinical cytogeneticist.   No podemos decirle cul ser su copago por los medicamentos por adelantado ya que esto es diferente dependiendo de la cobertura de su seguro. Sin embargo, es posible que podamos encontrar un medicamento sustituto a Audiological scientist un formulario para que el seguro cubra el medicamento que se considera necesario.   Si se requiere una autorizacin previa para que su compaa de seguros [place] su medicamento, por favor permtanos de 1 a 2 das hbiles para completar [address]  Los precios de los medicamentos varan con frecuencia dependiendo del Environmental consultant de dnde se surte la receta y alguna farmacias pueden ofrecer precios ms baratos.  El sitio web www.goodrx.com tiene cupones para medicamentos de Health and safety inspector. Los precios aqu no tienen en cuenta lo que podra costar con la ayuda del seguro (puede ser ms  barato con su seguro), pero el sitio web puede darle el precio si no utiliz Tourist information centre manager.  - Puede imprimir el cupn correspondiente y llevarlo con su receta a la farmacia.  - Tambin puede pasar por nuestra oficina durante el horario de atencin regular y Education officer, museum una tarjeta de cupones de GoodRx.  - Si necesita que su receta se enve electrnicamente a una farmacia diferente, informe a nuestra oficina a travs de MyChart de [place] o por telfono llamando al [phone] y presione la opcin 4.

## 2023-03-14 NOTE — Progress Notes (Signed)
 Daily Session Note  Patient Details  Name: Brianna Briggs MRN: [id number] Date of Birth: [birth year] Referring Provider:   Flowsheet Row Pulmonary Rehab from 12/04/2022 in [facility] Cardiac and Pulmonary Rehab  Referring Provider Dr. Inda Coke       Encounter Date: 03/14/2023  Check In:  Session Check In - 03/14/23 1345       Check-In   Supervising physician immediately available to respond to emergencies See telemetry face sheet for immediately available ER MD    Location [facility]-Cardiac & Pulmonary Rehab    Staff Present Susann Givens RN,BSN;Margaret Best, MS, Exercise Physiologist;Maxon Conetta BS, Exercise Physiologist;Noah Tickle, BS, Exercise Physiologist    Virtual Visit No    Medication changes reported     No    Fall or balance concerns reported    No    Warm-up and Cool-down Performed on first and last piece of equipment    Resistance Training Performed Yes    VAD Patient? No    PAD/SET Patient? No      Pain Assessment   Currently in Pain? No/denies                Social History   Tobacco Use  Smoking Status Former   Current packs/day: 0.00   Average packs/day: 1.5 packs/day for 40.0 years (60.0 ttl pk-yrs)   Types: Cigarettes   Start date: 07/1981   Quit date: 07/2021   Years since quitting: 1.6  Smokeless Tobacco Never    Goals Met:  Independence with exercise equipment Exercise tolerated well No report of concerns or symptoms today Strength training completed today  Goals Unmet:  Not Applicable  Comments: Pt able to follow exercise prescription today without complaint.  Will continue to monitor for progression.    Dr. Bethann Punches is Medical Director for [facility] Cardiac Rehabilitation.  Dr. Vida Rigger is Medical Director for [facility] Pulmonary Rehabilitation.

## 2023-03-15 ENCOUNTER — Encounter: Payer: Medicare Other | Admitting: *Deleted

## 2023-03-15 DIAGNOSIS — J449 Chronic obstructive pulmonary disease, unspecified: Secondary | ICD-10-CM | POA: Diagnosis not present

## 2023-03-15 NOTE — Progress Notes (Signed)
 Daily Session Note  Patient Details  Name: Brianna Briggs MRN: [id number] Date of Birth: [birth year] Referring Provider:   Flowsheet Row Pulmonary Rehab from 12/04/2022 in [facility] Cardiac and Pulmonary Rehab  Referring Provider Dr. Inda Coke       Encounter Date: 03/15/2023  Check In:  Session Check In - 03/15/23 1356       Check-In   Supervising physician immediately available to respond to emergencies See telemetry face sheet for immediately available ER MD    Location [facility]-Cardiac & Pulmonary Rehab    Staff Present Susann Givens RN,BSN;Joseph Weyman Pedro, [place], Exercise Physiologist    Virtual Visit No    Medication changes reported     No    Fall or balance concerns reported    No    Warm-up and Cool-down Performed on first and last piece of equipment    Resistance Training Performed Yes    VAD Patient? No    PAD/SET Patient? No      Pain Assessment   Currently in Pain? No/denies                Social History   Tobacco Use  Smoking Status Former   Current packs/day: 0.00   Average packs/day: 1.5 packs/day for 40.0 years (60.0 ttl pk-yrs)   Types: Cigarettes   Start date: 07/1981   Quit date: 07/2021   Years since quitting: 1.6  Smokeless Tobacco Never    Goals Met:  Independence with exercise equipment Exercise tolerated well No report of concerns or symptoms today Strength training completed today  Goals Unmet:  Not Applicable  Comments: Pt able to follow exercise prescription today without complaint.  Will continue to monitor for progression.    Dr. Bethann Punches is Medical Director for [facility] Cardiac Rehabilitation.  Dr. Vida Rigger is Medical Director for [facility] Pulmonary Rehabilitation.

## 2023-03-19 ENCOUNTER — Encounter: Payer: Medicare Other | Attending: Pulmonary Disease | Admitting: *Deleted

## 2023-03-19 ENCOUNTER — Ambulatory Visit
Admission: RE | Admit: 2023-03-19 | Discharge: 2023-03-19 | Disposition: A | Discharge status: Home / Self Care | Payer: Medicare Other | Source: Ambulatory Visit | Attending: Internal Medicine | Admitting: Internal Medicine

## 2023-03-19 DIAGNOSIS — Z87891 Personal history of nicotine dependence: Secondary | ICD-10-CM | POA: Insufficient documentation

## 2023-03-19 DIAGNOSIS — J449 Chronic obstructive pulmonary disease, unspecified: Secondary | ICD-10-CM | POA: Diagnosis present

## 2023-03-19 NOTE — Progress Notes (Signed)
 Daily Session Note  Patient Details  Name: Brianna Briggs MRN: [id number] Date of Birth: 18-Aug-1950 Referring Provider:   Flowsheet Row Pulmonary Rehab from 12/04/2022 in [facility] Cardiac and Pulmonary Rehab  Referring Provider Dr. Inda Coke       Encounter Date: 03/19/2023  Check In:  Session Check In - 03/19/23 1354       Check-In   Supervising physician immediately available to respond to emergencies See telemetry face sheet for immediately available ER MD    Location [facility]-Cardiac & Pulmonary Rehab    Staff Present Susann Givens RN,BSN;Joseph Hollace Kinnier;Betsy Coder PhD, RN,CNS,CEN;Noah Tickle, BS, Exercise Physiologist    Virtual Visit No    Medication changes reported     No    Fall or balance concerns reported    No    Warm-up and Cool-down Performed on first and last piece of equipment    Resistance Training Performed Yes    VAD Patient? No    PAD/SET Patient? No      Pain Assessment   Currently in Pain? No/denies                Social History   Tobacco Use  Smoking Status Former   Current packs/day: 0.00   Average packs/day: 1.5 packs/day for 40.0 years (60.0 ttl pk-yrs)   Types: Cigarettes   Start date: 07/1981   Quit date: 07/2021   Years since quitting: 1.6  Smokeless Tobacco Never    Goals Met:  Independence with exercise equipment Exercise tolerated well No report of concerns or symptoms today Strength training completed today  Goals Unmet:  Not Applicable  Comments:  Kewanna graduated today from  rehab with 36 sessions completed.  Details of the patient's exercise prescription and what She needs to do in order to continue the prescription and progress were discussed with patient.  Patient was given a copy of prescription and goals.  Patient verbalized understanding. Tamekia plans to continue to exercise by walking at home.     Dr. Bethann Punches is Medical Director for [facility] Cardiac Rehabilitation.  Dr. Vida Rigger is  Medical Director for [facility] Pulmonary Rehabilitation.

## 2023-03-19 NOTE — Progress Notes (Signed)
 Pulmonary Individual Treatment Plan  Patient Details  Name: Brianna Briggs MRN: [id number] Date of Birth: Mar 28, 1950 Referring Provider:   Flowsheet Row Pulmonary Rehab from 12/04/2022 in [facility] Cardiac and Pulmonary Rehab  Referring Provider Dr. Inda Coke       Initial Encounter Date:  Flowsheet Row Pulmonary Rehab from 12/04/2022 in [facility] Cardiac and Pulmonary Rehab  Date 12/04/22       Visit Diagnosis: Chronic obstructive pulmonary disease, unspecified COPD type (HCC)  Patient's Home Medications on Admission:  Current Outpatient Medications:    Cholecalciferol 25 MCG (1000 UT) capsule, Take 2,000 Units by mouth daily., Disp: , Rfl:    gatifloxacin (ZYMAXID) 0.5 % SOLN, Place 1 drop into the left eye 3 (three) times daily., Disp: , Rfl:    guaiFENesin-dextromethorphan (ROBITUSSIN DM) 100-10 MG/5ML syrup, Take 10 mLs by mouth every 4 (four) hours as needed for cough., Disp: 118 mL, Rfl: 0   HYDROcodone-acetaminophen (NORCO/VICODIN) 5-325 MG tablet, Take 1 tablet by mouth every 4 (four) hours as needed for moderate pain. (Patient not taking: Reported on 10/23/2022), Disp: 20 tablet, Rfl: 0   ketoconazole (NIZORAL) 2 % shampoo, Apply 1 Application topically 3 (three) times a week. Wash scalp 3 times a week, let sit 5 minutes and rinse out, for seborrheic dermatitis, Disp: 120 mL, Rfl: 4   ketorolac (ACULAR) 0.5 % ophthalmic solution, Place 1 drop into the left eye 4 (four) times daily., Disp: , Rfl:    minoxidil (LONITEN) 2.5 MG tablet, Take 1/2 tablet once daily (Patient not taking: Reported on 04/24/2022), Disp: 15 tablet, Rfl: 2   mometasone (ELOCON) 0.1 % cream, Apply once daily up to 5 days a week as needed, Disp: 45 g, Rfl: 2   Multiple Vitamin (MULTIVITAMIN) tablet, Take 2 tablets by mouth daily., Disp: , Rfl:    OXYGEN, Inhale 2 L into the lungs at bedtime., Disp: , Rfl:    pantoprazole (PROTONIX) 20 MG tablet, Take by mouth., Disp: , Rfl:    pantoprazole (PROTONIX) 20 MG  tablet, Take 20 mg by mouth daily. (Patient not taking: Reported on 10/23/2022), Disp: , Rfl:    polyethylene glycol-electrolytes (NULYTELY) 420 g solution, See admin instructions., Disp: , Rfl:    prednisoLONE acetate (PRED FORTE) 1 % ophthalmic suspension, Place 1 drop into the left eye 4 (four) times daily., Disp: , Rfl:    predniSONE (DELTASONE) 2.5 MG tablet, Take by mouth., Disp: , Rfl:    sulfamethoxazole-trimethoprim (BACTRIM) 400-80 MG tablet, Take by mouth., Disp: , Rfl:    tacrolimus (PROTOPIC) 0.1 % ointment, Apply topically as directed. Qd to bid to aa hands  for eczema prn flares, Disp: 100 g, Rfl: 3   traZODone (DESYREL) 50 MG tablet, Take by mouth. (Patient not taking: Reported on 10/23/2022), Disp: , Rfl:    VENTOLIN HFA 108 (90 Base) MCG/ACT inhaler, Inhale 2 puffs into the lungs every evening., Disp: , Rfl:   Past Medical History: Past Medical History:  Diagnosis Date   Acute on chronic respiratory failure with hypoxia (HCC)    Allergic genetic state    Breast cancer, right (HCC) 1996   a.) s/p total mastectomy   Bronchitis    Chronic eczema    COPD (chronic obstructive pulmonary disease) (HCC)    Coronary artery calcification seen on CT scan    Dependence on nocturnal oxygen therapy    a.) 2 L/[place]   Diastolic dysfunction    a.) TTE 02/28/2021: EF 50%, triv MR/TR, G1DD; b.) TEE 07/16/2021:  EF 55-60%, G1DD.   Diverticulitis of large intestine with abscess without bleeding    DOE (dyspnea on exertion)    History of 2019 novel coronavirus disease (COVID-19) 08/2021   History of chicken pox    History of colon polyps    Hyperlipidemia    Hypophosphatemia    Hypotension    MDD (major depressive disorder)    Migraines    Perforated diverticulum of intestine    Peripheral vascular disease with claudication (HCC)    PSVT (paroxysmal supraventricular tachycardia) (HCC)    PVC (premature ventricular contraction)    Severe sepsis with lactic acidosis (HCC)    Single kidney     Squamous cell carcinoma of skin 01/27/2019   Right inferior lateral thigh. KA-type   Thoracic aortic atherosclerosis (HCC)    Tobacco use     Tobacco Use: Social History   Tobacco Use  Smoking Status Former   Current packs/day: 0.00   Average packs/day: 1.5 packs/day for 40.0 years (60.0 ttl pk-yrs)   Types: Cigarettes   Start date: 07/1981   Quit date: 07/2021   Years since quitting: 1.6  Smokeless Tobacco Never    Labs: Review Flowsheet       Latest Ref Rng & Units 08/21/2021  Labs for ITP Cardiac and Pulmonary Rehab  Bicarbonate 20.0 - 28.0 mmol/L 27.7   O2 Saturation % 85.6      Pulmonary Assessment Scores:  Pulmonary Assessment Scores     Row Name 12/04/22 1623 03/05/23 1355       ADL UCSD   ADL Phase Entry Exit    SOB Score total 63 70    Rest 0 0    Walk 3 4    Stairs 4 4    Bath 3 3    Dress 3 2    Shop 2 2      CAT Score   CAT Score 33 31      mMRC Score   mMRC Score 3 --             UCSD: Self-administered rating of dyspnea associated with activities of daily living (ADLs) 6-point scale (0 = "not at all" to 5 = "maximal or unable to do because of breathlessness")  Scoring Scores range from 0 to 120.  Minimally important difference is 5 units  CAT: CAT can identify the health impairment of COPD patients and is better correlated with disease progression.  CAT has a scoring range of zero to 40. The CAT score is classified into four groups of low (less than 10), medium (10 - 20), high (21-30) and very high (31-40) based on the impact level of disease on health status. A CAT score over 10 suggests significant symptoms.  A worsening CAT score could be explained by an exacerbation, poor medication adherence, poor inhaler technique, or progression of COPD or comorbid conditions.  CAT MCID is 2 points  mMRC: mMRC (Modified Medical Research Council) Dyspnea Scale is used to assess the degree of baseline functional disability in patients of  respiratory disease due to dyspnea. No minimal important difference is established. A decrease in score of 1 point or greater is considered a positive change.   Pulmonary Function Assessment:  Pulmonary Function Assessment - 12/04/22 1625       Pulmonary Function Tests   FVC% 83 %    FEV1% 38 %    FEV1/FVC Ratio 34.64             Exercise Target Goals: Exercise  Program Goal: Individual exercise prescription set using results from initial 6 min walk test and THRR while considering  patient's activity barriers and safety.   Exercise Prescription Goal: Initial exercise prescription builds to 30-45 minutes a day of aerobic activity, 2-3 days per week.  Home exercise guidelines will be given to patient during program as part of exercise prescription that the participant will acknowledge.  Education: Aerobic Exercise: - Group verbal and visual presentation on the components of exercise prescription. Introduces F.I.T.T principle from ACSM for exercise prescriptions.  Reviews F.I.T.T. principles of aerobic exercise including progression. Written material given at graduation. Flowsheet Row Pulmonary Rehab from 03/06/2023 in [facility] Cardiac and Pulmonary Rehab  Date 01/24/23  Educator [facility]  Instruction Review Code 1- [employer] Understanding       Education: Resistance Exercise: - Group verbal and visual presentation on the components of exercise prescription. Introduces F.I.T.T principle from ACSM for exercise prescriptions  Reviews F.I.T.T. principles of resistance exercise including progression. Written material given at graduation. Flowsheet Row Pulmonary Rehab from 03/06/2023 in [facility] Cardiac and Pulmonary Rehab  Date 01/15/23  Educator NT  Instruction Review Code 1- Verbalizes Understanding        Education: Exercise & Equipment Safety: - Individual verbal instruction and demonstration of equipment use and safety with use of the equipment. Flowsheet Row Pulmonary Rehab from  03/06/2023 in [facility] Cardiac and Pulmonary Rehab  Date 12/04/22  Educator [facility]  Instruction Review Code 1- Verbalizes Understanding       Education: Exercise Physiology & General Exercise Guidelines: - Group verbal and written instruction with models to review the exercise physiology of the cardiovascular system and associated critical values. Provides general exercise guidelines with specific guidelines to those with heart or lung disease.  Flowsheet Row Pulmonary Rehab from 03/06/2023 in [facility] Cardiac and Pulmonary Rehab  Date 01/08/23  Educator [facility]  Instruction Review Code 1- [employer] Understanding       Education: Flexibility, Balance, Mind/Body Relaxation: - Group verbal and visual presentation with interactive activity on the components of exercise prescription. Introduces F.I.T.T principle from ACSM for exercise prescriptions. Reviews F.I.T.T. principles of flexibility and balance exercise training including progression. Also discusses the mind body connection.  Reviews various relaxation techniques to help reduce and manage stress (i.e. Deep breathing, progressive muscle relaxation, and visualization). Balance handout provided to take home. Written material given at graduation. Flowsheet Row Pulmonary Rehab from 03/06/2023 in [facility] Cardiac and Pulmonary Rehab  Date 01/15/23  Educator NT  Instruction Review Code 1- Verbalizes Understanding       Activity Barriers & Risk Stratification:  Activity Barriers & Cardiac Risk Stratification - 12/04/22 1611       Activity Barriers & Cardiac Risk Stratification   Activity Barriers Other (comment)    Comments lower back pain occationally (no history of injury)             6 Minute Walk:  6 Minute Walk     Row Name 12/04/22 1606 03/01/23 1405       6 Minute Walk   Phase Initial Discharge    Distance 790 feet 815 feet    Distance % Change -- 3.16 %    Distance Feet Change -- 25 ft    Walk Time 6 minutes 6 minutes    # of Rest  Breaks 0 0    MPH 1.5 1.54    METS 2.4 2.1    RPE 13 15    Perceived Dyspnea  3 3    VO2  Peak 8.4 7.2    Symptoms Yes (comment) Yes (comment)    Comments SOB 3 Severe shortness of breathe    Resting HR 96 bpm 94 bpm    Resting BP 122/82 112/68    Resting Oxygen Saturation  90 % 94 %    Exercise Oxygen Saturation  during 6 min walk 78 % 86 %    Max Ex. HR 144 bpm 128 bpm    Max Ex. BP 150/80 132/70    2 Minute Post BP 124/80 114/68      Interval HR   1 Minute HR 100 110    2 Minute HR 122 112    3 Minute HR 144 117    4 Minute HR 141 112    5 Minute HR 140 102    6 Minute HR 142 128    2 Minute Post HR 112 108    Interval Heart Rate? Yes Yes      Interval Oxygen   Interval Oxygen? Yes Yes    Baseline Oxygen Saturation % 90 % 94 %    1 Minute Oxygen Saturation % 86 % 88 %    1 Minute Liters of Oxygen 0 L 2 L    2 Minute Oxygen Saturation % 85 % 89 %    2 Minute Liters of Oxygen 0 L 2 L    3 Minute Oxygen Saturation % 81 % 89 %    3 Minute Liters of Oxygen 0 L 2 L    4 Minute Oxygen Saturation % 80 % 88 %    4 Minute Liters of Oxygen 0 L 2 L    5 Minute Oxygen Saturation % 80 % 88 %    5 Minute Liters of Oxygen 0 L 2 L    6 Minute Oxygen Saturation % 78 % 86 %    6 Minute Liters of Oxygen 0 L 2 L    2 Minute Post Oxygen Saturation % 90 % 95 %    2 Minute Post Liters of Oxygen 0 L 2 L            Oxygen Initial Assessment:  Oxygen Initial Assessment - 12/04/22 1625       Home Oxygen   Home Oxygen Device Home Concentrator;E-Tanks;Portable Concentrator    Sleep Oxygen Prescription Continuous    Liters per minute 2    Home Exercise Oxygen Prescription Pulsed   as needed   Liters per minute 2    Home Resting Oxygen Prescription Pulsed   as needed   Liters per minute 2    Compliance with Home Oxygen Use Yes   Patient uses O2 at night. She reports that she does not use her portable consentrator but she was encouraged to monitor her SaO2 at home and use her pulsed O2  if her SaO2 drops below 88% during the day b/c her SaO2 dropped significantly during her .     Initial 6 min Walk   Oxygen Used None      Program Oxygen Prescription   Program Oxygen Prescription Continuous    Liters per minute 2    Comments Patient's SaO2 dropped significantly during her . Lowest reading was 78%. Will use continuous O2 as needed up to 2 L to maintain SaO2 >88% during exercise.      Intervention   Short Term Goals To learn and exhibit compliance with exercise, home and travel O2 prescription;To learn and understand importance of monitoring SPO2 with pulse oximeter and  demonstrate accurate use of the pulse oximeter.;To learn and understand importance of maintaining oxygen saturations>88%;To learn and demonstrate proper pursed lip breathing techniques or other breathing techniques. ;To learn and demonstrate proper use of respiratory medications    Long  Term Goals Exhibits compliance with exercise, home  and travel O2 prescription;Verbalizes importance of monitoring SPO2 with pulse oximeter and return demonstration;Maintenance of O2 saturations>88%;Exhibits proper breathing techniques, such as pursed lip breathing or other method taught during program session;Compliance with respiratory medication;Demonstrates proper use of MDI's             Oxygen Re-Evaluation:  Oxygen Re-Evaluation     Row Name 12/21/22 1408 02/05/23 1359 03/05/23 1437         Program Oxygen Prescription   Program Oxygen Prescription Continuous Continuous Continuous     Liters per minute 2 2 2        Home Oxygen   Home Oxygen Device Home Concentrator;E-Tanks;Portable Concentrator Home Concentrator;E-Tanks;Portable Concentrator Home Concentrator;E-Tanks;Portable Concentrator     Sleep Oxygen Prescription Continuous Continuous Continuous     Liters per minute 2 2 2      Home Exercise Oxygen Prescription Pulsed  PRN Pulsed Pulsed     Liters per minute 2 2 2      Home Resting Oxygen  Prescription Pulsed Pulsed Pulsed     Liters per minute 2 2 2      Compliance with Home Oxygen Use Yes Yes Yes       Goals/Expected Outcomes   Short Term Goals To learn and demonstrate proper pursed lip breathing techniques or other breathing techniques.  To learn and understand importance of maintaining oxygen saturations>88%;To learn and understand importance of monitoring SPO2 with pulse oximeter and demonstrate accurate use of the pulse oximeter. Other     Long  Term Goals Exhibits proper breathing techniques, such as pursed lip breathing or other method taught during program session Maintenance of O2 saturations>88%;Verbalizes importance of monitoring SPO2 with pulse oximeter and return demonstration Other     Comments Informed patient how to perform the Pursed Lipped breathing technique. Told patient to Inhale through the nose and out the mouth with pursed lips to keep their airways open, help oxygenate them better, practice when at rest or doing strenuous activity. Patient Verbalizes understanding of technique and will work on and be reiterated during [facility]. She has a pulse oximeter to check her oxygen saturation at home. Informed her where to get one and explained why it is important to have one. Reviewed that oxygen saturations should be 88 percent and above. Patient verbalizes understanding. Verlaine understands how to check her oxygen and knows how to use pursed lip breath. She wants to continue to exercise but finds it hard to get the motivation.     Goals/Expected Outcomes Short: use PLB with exertion. Long: use PLB on exertion proficiently and independently. Short: monitor oxygen at home with exertion. Long: maintain oxygen saturations above 88 percent independently. Short: maintain o2 sats and plb at home. Long: maintain lung Health independently.              Oxygen Discharge (Final Oxygen Re-Evaluation):  Oxygen Re-Evaluation - 03/05/23 1437       Program Oxygen Prescription    Program Oxygen Prescription Continuous    Liters per minute 2      Home Oxygen   Home Oxygen Device Home Concentrator;E-Tanks;Portable Concentrator    Sleep Oxygen Prescription Continuous    Liters per minute 2    Home Exercise Oxygen Prescription Pulsed  Liters per minute 2    Home Resting Oxygen Prescription Pulsed    Liters per minute 2    Compliance with Home Oxygen Use Yes      Goals/Expected Outcomes   Short Term Goals Other    Long  Term Goals Other    Comments Brianna Briggs understands how to check her oxygen and knows how to use pursed lip breath. She wants to continue to exercise but finds it hard to get the motivation.    Goals/Expected Outcomes Short: maintain o2 sats and plb at home. Long: maintain lung Health independently.             Initial Exercise Prescription:  Initial Exercise Prescription - 12/04/22 1600       Date of Initial Exercise RX and Referring Provider   Date 12/04/22    Referring Provider Dr. Inda Coke      Oxygen   Oxygen Continuous    Liters 2   use supplemental O2 as needed up to 2 L to maintain SaO2 >88%   Maintain Oxygen Saturation 88% or higher      Treadmill   MPH 1.3    Grade 1    Minutes 15    METs 2.17      NuStep   Level 1   T6   SPM 80    Minutes 15    METs 2.4      REL-XR   Level 1    Speed 50    Minutes 15    METs 2.4      Biostep-RELP   Level 1    SPM 50    Minutes 15    METs 2.4      Prescription Details   Frequency (times per week) 3    Duration Progress to 30 minutes of continuous aerobic without signs/symptoms of physical distress      Intensity   THRR 40-80% of Max Heartrate 116-140    Ratings of Perceived Exertion 11-13    Perceived Dyspnea 0-4      Progression   Progression Continue to progress workloads to maintain intensity without signs/symptoms of physical distress.      Resistance Training   Training Prescription Yes    Weight 1    Reps 10-15             Perform Capillary  Blood Glucose checks as needed.  Exercise Prescription Changes:   Exercise Prescription Changes     Row Name 12/04/22 1600 12/28/22 1600 01/09/23 0700 01/24/23 1400 02/07/23 1400     Response to Exercise   Blood Pressure (Admit) 122/82 128/72 118/64 132/70 124/60   Blood Pressure (Exercise) 150/80 138/64 138/72 122/56 --   Blood Pressure (Exit) 124/80 112/60 120/64 122/80 134/68   Heart Rate (Admit) 96 bpm 92 bpm 79 bpm 97 bpm 94 bpm   Heart Rate (Exercise) 144 bpm 130 bpm 141 bpm 119 bpm 123 bpm   Heart Rate (Exit) 98 bpm 106 bpm 105 bpm 90 bpm 98 bpm   Oxygen Saturation (Admit) 90 % 90 % 93 % 89 % 93 %   Oxygen Saturation (Exercise) 78 % 88 % 88 % 88 % 89 %   Oxygen Saturation (Exit) 90 % 92 % 93 % 95 % 91 %   Rating of Perceived Exertion (Exercise) 13 15 15 14 15    Perceived Dyspnea (Exercise) 3 2 3.5 2 3    Symptoms SOB 3 3 SOB none none   Comments 6 MWT results First three  days of exercise -- -- --   Duration -- Progress to 30 minutes of  aerobic without signs/symptoms of physical distress Progress to 30 minutes of  aerobic without signs/symptoms of physical distress Progress to 30 minutes of  aerobic without signs/symptoms of physical distress Progress to 30 minutes of  aerobic without signs/symptoms of physical distress   Intensity -- THRR unchanged THRR unchanged THRR unchanged THRR unchanged     Progression   Progression -- Continue to progress workloads to maintain intensity without signs/symptoms of physical distress. Continue to progress workloads to maintain intensity without signs/symptoms of physical distress. Continue to progress workloads to maintain intensity without signs/symptoms of physical distress. Continue to progress workloads to maintain intensity without signs/symptoms of physical distress.   Average METs -- 1.98 1.95 1.97 1.98     Resistance Training   Training Prescription -- Yes Yes Yes Yes   Weight -- 2 lb 2 lb 2 lb 2 lb   Reps -- 10-15 10-15 10-15 10-15      Interval Training   Interval Training -- No No No No     Oxygen   Oxygen -- Continuous Continuous Continuous Continuous   Liters -- 2  use supplemental O2 as needed up to 2 L to maintain SaO2 >88% 2  use supplemental O2 as needed up to 2 L to maintain SaO2 >88% 2 2     Treadmill   MPH -- 1.3 1.5 1.4 1.5   Grade -- 0 0 0 0   Minutes -- 15 15 15 15    METs -- 1.99 2.15 2.07 2.15     NuStep   Level -- -- 2  T6 nustep 2  T6 1  T6   Minutes -- -- 15 15 15    METs -- -- 1.7 1.6 1.7     REL-XR   Level -- 1 1 1 1    Minutes -- 15 15 15 15    METs -- -- 2.4 2.9 --     Biostep-RELP   Level -- 1 1 -- 2   Minutes -- 15 15 -- 15   METs -- 2 2 -- --     Oxygen   Maintain Oxygen Saturation -- 88% or higher 88% or higher 88% or higher --    Row Name 02/08/23 1400 02/20/23 0800 03/08/23 1700         Response to Exercise   Blood Pressure (Admit) -- 106/60 112/64     Blood Pressure (Exit) -- 104/66 104/64     Heart Rate (Admit) -- 80 bpm 94 bpm     Heart Rate (Exercise) -- 126 bpm 128 bpm     Heart Rate (Exit) -- 90 bpm 99 bpm     Oxygen Saturation (Admit) -- 92 % 94 %     Oxygen Saturation (Exercise) -- 89 % 88 %     Oxygen Saturation (Exit) -- 94 % 91 %     Rating of Perceived Exertion (Exercise) -- 15 13     Perceived Dyspnea (Exercise) -- 3 2     Symptoms -- none none     Duration -- Progress to 30 minutes of  aerobic without signs/symptoms of physical distress Progress to 30 minutes of  aerobic without signs/symptoms of physical distress     Intensity -- THRR unchanged THRR unchanged       Progression   Progression -- Continue to progress workloads to maintain intensity without signs/symptoms of physical distress. Continue to progress workloads to maintain intensity  without signs/symptoms of physical distress.     Average METs -- 1.98 2.04       Resistance Training   Training Prescription -- Yes Yes     Weight -- 2 lb 2 lb     Reps -- 10-15 10-15       Interval Training    Interval Training -- No No       Oxygen   Oxygen -- Continuous Continuous     Liters -- 2 2       Treadmill   MPH -- 1.5 1.5     Grade -- 0 0     Minutes -- 15 15     METs -- 2.15 2.15       NuStep   Level -- 3  T6 4     Minutes -- 15 15     METs -- 1.8 2.5       REL-XR   Level -- 1 --     Minutes -- 15 --       T5 Nustep   Level -- -- 2  T6     Minutes -- -- 15     METs -- -- 1.9       Biostep-RELP   Level -- 2 --     Minutes -- 15 --     METs -- 2 --       Home Exercise Plan   Plans to continue exercise at Home (comment)  Brianna Briggs plans to [facility] outside for home exercise once she gets her roller. She was also considering joining a gym to continue exercise. I informed her about the [employer]. Home (comment)  Brianna Briggs plans to [facility] outside for home exercise once she gets her roller. She was also considering joining a gym to continue exercise. I informed her about the [employer]. --     Frequency Add 1 additional day to program exercise sessions. Add 1 additional day to program exercise sessions. --     Initial Home Exercises Provided 02/08/23 02/08/23 --       Oxygen   Maintain Oxygen Saturation 88% or higher 88% or higher --              Exercise Comments:   Exercise Comments     Row Name 12/18/22 1350           Exercise Comments First full day of exercise!  Patient was oriented to gym and equipment including functions, settings, policies, and procedures.  Patient's individual exercise prescription and treatment plan were reviewed.  All starting workloads were established based on the results of the 6 minute walk test done at initial orientation visit.  The plan for exercise progression was also introduced and progression will be customized based on patient's performance and goals.                Exercise Goals and Review:   Exercise Goals     Row Name 12/04/22 1619             Exercise Goals   Increase Physical Activity Yes       Intervention  Provide advice, education, support and counseling about physical activity/exercise needs.;Develop an individualized exercise prescription for aerobic and resistive training based on initial evaluation findings, risk stratification, comorbidities and participant's personal goals.       Expected Outcomes Short Term: Attend rehab on a regular basis to increase amount of physical activity.;Long Term: Add in home exercise to make exercise part of routine and  to increase amount of physical activity.;Long Term: Exercising regularly at least 3-5 days a week.       Increase Strength and Stamina Yes       Intervention Provide advice, education, support and counseling about physical activity/exercise needs.;Develop an individualized exercise prescription for aerobic and resistive training based on initial evaluation findings, risk stratification, comorbidities and participant's personal goals.       Expected Outcomes Short Term: Increase workloads from initial exercise prescription for resistance, speed, and METs.;Short Term: Perform resistance training exercises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 various heart conditions including Myocardial Infarction, Angina, Valve Disease, and Cardiac Arrhythmias.  Written material given at graduation. Flowsheet Row Pulmonary Rehab from 03/06/2023 in [facility] Cardiac and Pulmonary Rehab  Date 02/14/23  Educator SB  Instruction Review Code 1- Verbalizes Understanding       Medication Safety: - Group verbal and visual instruction to review commonly prescribed medications for heart and lung disease. Reviews the medication, class of the drug, and side effects. Includes the steps to properly store meds and maintain the prescription regimen.  Written material given at graduation. Flowsheet Row Pulmonary Rehab from 03/06/2023 in [facility] Cardiac and Pulmonary Rehab  Date 02/21/23  Educator SB  Instruction Review Code 1- Verbalizes Understanding       Other: -Provides group and verbal instruction on various topics (see comments)   Knowledge Questionnaire Score:  Knowledge Questionnaire Score - 03/05/23 1354       Knowledge Questionnaire Score   Post Score 15/18              Core Components/Risk Factors/Patient Goals at Admission:  Personal Goals and Risk Factors at Admission - 12/04/22 1620       Core Components/Risk Factors/Patient Goals on Admission    Weight Management Yes;Weight Loss    Intervention Weight Management: Develop a combined nutrition and exercise program designed to reach desired caloric intake, while maintaining appropriate intake of nutrient and fiber, sodium and fats, and appropriate energy expenditure required for the weight goal.;Weight Management: Provide education and appropriate resources to help participant work on and attain dietary goals.;Weight Management/Obesity: Establish reasonable short term and long term weight goals.    Admit Weight 148 lb 1.6 oz (67.2 kg)    Goal Weight: Short Term 143 lb (64.9 kg)    Goal Weight: Long Term 135 lb (61.2 kg)    Expected Outcomes Short Term:  Continue to assess and modify interventions until short term weight is achieved;Long Term: Adherence to nutrition and physical activity/exercise program aimed toward attainment of established weight goal;Weight Loss: Understanding of general recommendations for a balanced deficit meal plan, which promotes 1-2 lb weight loss per week and includes a negative energy balance of [phone] kcal/d;Understanding recommendations for meals to include 15-35% energy as protein, 25-35% energy from fat, 35-60% energy from carbohydrates, less than 200mg  of dietary cholesterol, 20-35 gm of total fiber daily;Understanding of distribution of calorie intake throughout the day with the consumption of 4-5 meals/snacks    Improve shortness of breath with ADL's Yes    Intervention Provide education, individualized exercise plan and daily activity instruction to help decrease symptoms of SOB with activities of daily living.    Expected Outcomes Short Term: Improve cardiorespiratory fitness to achieve a reduction of symptoms when performing ADLs;Long Term: Be able to perform more ADLs without symptoms or delay the onset of symptoms    Lipids Yes  Intervention Provide education and support for participant on nutrition & aerobic/resistive exercise along with prescribed medications to achieve LDL 70mg , HDL >40mg .    Expected Outcomes Short Term: Participant states understanding of desired cholesterol values and is compliant with medications prescribed. Participant is following exercise prescription and nutrition guidelines.;Long Term: Cholesterol controlled with medications as prescribed, with individualized exercise RX and with personalized nutrition plan. Value goals: LDL < 70mg , HDL > 40 mg.             Education:Diabetes - Individual verbal and written instruction to review signs/symptoms of diabetes, desired ranges of glucose level fasting, after meals and with exercise. Acknowledge that pre and post exercise glucose  checks will be done for 3 sessions at entry of program.   Know Your Numbers and Heart Failure: - Group verbal and visual instruction to discuss disease risk factors for cardiac and pulmonary disease and treatment options.  Reviews associated critical values for Overweight/Obesity, Hypertension, Cholesterol, and Diabetes.  Discusses basics of heart failure: signs/symptoms and treatments.  Introduces Heart Failure Zone chart for action plan for heart failure.  Written material given at graduation.   Core Components/Risk Factors/Patient Goals Review:   Goals and Risk Factor Review     Row Name 12/21/22 1353 01/18/23 1422 02/05/23 1404 03/05/23 1431       Core Components/Risk Factors/Patient Goals Review   Personal Goals Review Improve shortness of breath with ADL's Improve shortness of breath with ADL's Improve shortness of breath with ADL's Other    Review Spoke to patient about their shortness of breath and what they can do to improve. Patient has been informed of breathing techniques when starting the program. Patient is informed to tell staff if they have had any med changes and that certain meds they are taking or not taking can be causing shortness of breath. Yolander states that she understands the breathing techniques, and hopes that attending rehab and working hard will improve that. She states that her current living situation coupled with her shortness of breathe makes daily activities harder for her. Rosabella has purchased a walker and is going to start walking at home. Now that she has her walker he will feel more confident in walking outside. She wants her shortness of breath to get better so she can do other hobbies and activities she enjoys. Nomie states she does not have any questions about her breathing. She has some days where she can breath ok but others are harder for her. She is Graduating [employer] and is interested in joining the [facility].    Expected Outcomes Short: Attend [employer]  regularly to improve shortness of breath with ADL's. Long: maintain independence with ADL's Short: Attend [facility] regularly to improve shortness of breath with ADL's. Long: maintain independence with ADL's Short: improve walking and get walker to start walking at home. Long: maintain walking at home independently and use walker. Short: Graduate [employer]. Long: maintain exercise independently.             Core Components/Risk Factors/Patient Goals at Discharge (Final Review):   Goals and Risk Factor Review - 03/05/23 1431       Core Components/Risk Factors/Patient Goals Review   Personal Goals Review Other    Review Kamayah states she does not have any questions about her breathing. She has some days where she can breath ok but others are harder for her. She is Graduating [employer] and is interested in joining the [employer].    Expected Outcomes Short: Graduate [employer]. Long: maintain  exercise independently.             ITP Comments:  ITP Comments     Row Name 10/23/22 1112 12/04/22 1604 12/06/22 1120 12/18/22 1350 01/03/23 0934   ITP Comments Virtual Visit completed. Patient informed on EP and RD appointment and 6 Minute walk test. Patient also informed of patient health questionnaires on My Chart. Patient Verbalizes understanding. Visit diagnosis can be found in [facility] 09/06/2022. Completed and gym orientation. Initial ITP created and sent for review to Dr. Jinny Sanders, Medical Director. 30 Day review completed. Medical Director ITP review done, changes made as directed, and signed approval by Medical Director.    new to program First full day of exercise!  Patient was oriented to gym and equipment including functions, settings, policies, and procedures.  Patient's individual exercise prescription and treatment plan were reviewed.  All starting workloads were established based on the results of the 6 minute walk test done at initial orientation visit.  The plan for exercise  progression was also introduced and progression will be customized based on patient's performance and goals. 30 Day review completed. Medical Director ITP review done, changes made as directed, and signed approval by Medical Director.    new to program    Row Name 01/31/23 0738 02/28/23 0721 03/19/23 1417       ITP Comments 30 Day review completed. Medical Director ITP review done, changes made as directed, and signed approval by Medical Director. 30 Day review completed. Medical Director ITP review done, changes made as directed, and signed approval by Medical Director. Drina graduated today from  rehab with 36 sessions completed.  Details of the patient's exercise prescription and what She needs to do in order to continue the prescription and progress were discussed with patient.  Patient was given a copy of prescription and goals.  Patient verbalized understanding. Shani plans to continue to exercise by walking at home.              Comments: Discharge ITP

## 2023-03-19 NOTE — Progress Notes (Signed)
 Discharge Summary   Brianna Briggs  DOB: 2050-06-02  Brianna Briggs graduated today from  rehab with 36 sessions completed.  Details of the patient's exercise prescription and what She needs to do in order to continue the prescription and progress were discussed with patient.  Patient was given a copy of prescription and goals.  Patient verbalized understanding. Brianna Briggs plans to continue to exercise by walking at home.   6 Minute Walk     Row Name 12/04/22 1606 03/01/23 1405       6 Minute Walk   Phase Initial Discharge    Distance 790 feet 815 feet    Distance % Change -- 3.16 %    Distance Feet Change -- 25 ft    Walk Time 6 minutes 6 minutes    # of Rest Breaks 0 0    MPH 1.5 1.54    METS 2.4 2.1    RPE 13 15    Perceived Dyspnea  3 3    VO2 Peak 8.4 7.2    Symptoms Yes (comment) Yes (comment)    Comments SOB 3 Severe shortness of breathe    Resting HR 96 bpm 94 bpm    Resting BP 122/82 112/68    Resting Oxygen Saturation  90 % 94 %    Exercise Oxygen Saturation  during 6 min walk 78 % 86 %    Max Ex. HR 144 bpm 128 bpm    Max Ex. BP 150/80 132/70    2 Minute Post BP 124/80 114/68      Interval HR   1 Minute HR 100 110    2 Minute HR 122 112    3 Minute HR 144 117    4 Minute HR 141 112    5 Minute HR 140 102    6 Minute HR 142 128    2 Minute Post HR 112 108    Interval Heart Rate? Yes Yes      Interval Oxygen   Interval Oxygen? Yes Yes    Baseline Oxygen Saturation % 90 % 94 %    1 Minute Oxygen Saturation % 86 % 88 %    1 Minute Liters of Oxygen 0 L 2 L    2 Minute Oxygen Saturation % 85 % 89 %    2 Minute Liters of Oxygen 0 L 2 L    3 Minute Oxygen Saturation % 81 % 89 %    3 Minute Liters of Oxygen 0 L 2 L    4 Minute Oxygen Saturation % 80 % 88 %    4 Minute Liters of Oxygen 0 L 2 L    5 Minute Oxygen Saturation % 80 % 88 %    5 Minute Liters of Oxygen 0 L 2 L    6 Minute Oxygen Saturation % 78 % 86 %    6 Minute Liters of Oxygen 0 L 2 L    2 Minute Post Oxygen  Saturation % 90 % 95 %    2 Minute Post Liters of Oxygen 0 L 2 L

## 2023-03-20 ENCOUNTER — Ambulatory Visit: Payer: Medicare Other

## 2023-03-28 ENCOUNTER — Ambulatory Visit (INDEPENDENT_AMBULATORY_CARE_PROVIDER_SITE_OTHER): Payer: Medicare Other | Admitting: Dermatology

## 2023-03-28 ENCOUNTER — Encounter: Payer: Self-pay | Admitting: Dermatology

## 2023-03-28 DIAGNOSIS — L720 Epidermal cyst: Secondary | ICD-10-CM | POA: Diagnosis not present

## 2023-03-28 MED ORDER — MUPIROCIN 2 % EX OINT: 1.0000 | TOPICAL_OINTMENT | Freq: Every day | CUTANEOUS | 0 refills | Status: DC

## 2023-03-28 NOTE — Patient Instructions (Addendum)

## 2023-03-28 NOTE — Progress Notes (Signed)
   Follow-Up Visit   Subjective  Brianna Briggs is a 73 y.o. female who presents for the following: Excision of cyst at R mid back paraspinal   The following portions of the chart were reviewed this encounter and updated as appropriate: medications, allergies, medical history  Review of Systems:  No other skin or systemic complaints except as noted in HPI or Assessment and Plan.  Objective  Well appearing patient in no apparent distress; mood and affect are within normal limits.  A focused examination was performed of the following areas: back Relevant physical exam findings are noted in the Assessment and Plan.   right mid back paraspinal 3.0 cm subcutaneous nodule  Assessment & Plan   EPIDERMAL INCLUSION CYST right mid back paraspinal Skin excision - right mid back paraspinal  Total excision diameter (cm):  3 Informed consent: discussed and consent obtained   Timeout: patient name, date of birth, surgical site, and procedure verified   Procedure prep:  Patient was prepped and draped in usual sterile fashion Prep type:  Chlorhexidine Anesthesia: the lesion was anesthetized in a standard fashion   Anesthetic:  1% lidocaine w/ epinephrine 1-100,000 buffered w/ 8.4% NaHCO3 (9 cc lido w/epi, 3 cc plain lido) Instrument used: #15 blade   Hemostasis achieved with: pressure   Outcome: patient tolerated procedure well with no complications    Skin repair - right mid back paraspinal Complexity:  Intermediate Final length (cm):  6.2 Informed consent: discussed and consent obtained   Timeout: patient name, date of birth, surgical site, and procedure verified   Procedure prep:  Patient was prepped and draped in usual sterile fashion Prep type:  Chlorhexidine Anesthesia: the lesion was anesthetized in a standard fashion   Anesthetic:  1% lidocaine w/ epinephrine 1-100,000 buffered w/ 8.4% NaHCO3 Reason for type of repair: reduce tension to allow closure, reduce the risk of  dehiscence, infection, and necrosis, reduce subcutaneous dead space and avoid a hematoma, allow closure of the large defect and preserve normal anatomy   Undermining: edges could be approximated without difficulty   Subcutaneous layers (deep stitches):  Suture size:  3-0 Suture type: Monocryl (poliglecaprone 25)   Stitches:  Buried vertical mattress Fine/surface layer approximation (top stitches):  Suture size:  4-0 Suture type: Prolene (polypropylene)   Stitches: simple running   Stitches comment:  Running locked Suture removal (days):  7 Hemostasis achieved with: suture, pressure and electrodesiccation Outcome: patient tolerated procedure well with no complications   Post-procedure details: sterile dressing applied and wound care instructions given   Dressing type: petrolatum, bandage and pressure dressing   Specimen 1 - Surgical pathology Differential Diagnosis: Cyst vs other  Check Margins: No   Return in about 1 week (around 04/04/2023) for Suture Removal.  Anise Salvo, RMA, am acting as scribe for Elie Goody, MD .  Documentation: I have reviewed the above documentation for accuracy and completeness, and I agree with the above.  Elie Goody, MD

## 2023-03-30 LAB — SURGICAL PATHOLOGY

## 2023-04-02 ENCOUNTER — Telehealth: Payer: Self-pay

## 2023-04-02 NOTE — Telephone Encounter (Signed)
-----   Message from [place] sent at 04/01/2023  4:31 PM EDT ----- Diagnosis right mid back paraspinal :       EXCISION, EPIDERMOID CYST, MARGINS FREE   Please call to share that excision fully removed the benign cyst and get update on surgical wound. Thank you.

## 2023-04-02 NOTE — Telephone Encounter (Signed)
 Discussed pathology results with patient. She states the excision site is healing well. Keep appointment for suture removal. JP

## 2023-04-04 ENCOUNTER — Encounter: Payer: Self-pay | Admitting: Dermatology

## 2023-04-04 ENCOUNTER — Ambulatory Visit (INDEPENDENT_AMBULATORY_CARE_PROVIDER_SITE_OTHER): Admitting: Dermatology

## 2023-04-04 DIAGNOSIS — Z48817 Encounter for surgical aftercare following surgery on the skin and subcutaneous tissue: Secondary | ICD-10-CM

## 2023-04-04 DIAGNOSIS — Z4802 Encounter for removal of sutures: Secondary | ICD-10-CM

## 2023-04-04 DIAGNOSIS — Z5189 Encounter for other specified aftercare: Secondary | ICD-10-CM

## 2023-04-04 NOTE — Progress Notes (Signed)
   Follow-Up Visit   Subjective  Brianna Briggs is a 73 y.o. female who presents for the following: Suture removal  Pathology showed EXCISION, EPIDERMOID CYST, MARGINS FREE at right mid back paraspinal   The following portions of the chart were reviewed this encounter and updated as appropriate: medications, allergies, medical history  Review of Systems:  No other skin or systemic complaints except as noted in HPI or Assessment and Plan.  Objective  Well appearing patient in no apparent distress; mood and affect are within normal limits.  Areas Examined: Back Relevant physical exam findings are noted in the Assessment and Plan.    Assessment & Plan   VISIT FOR WOUND CHECK   ENCOUNTER FOR REMOVAL OF SUTURES   Encounter for Removal of Sutures - Incision site is clean, dry and intact. - Wound cleansed, sutures removed, wound cleansed and steri strips applied.  - Discussed pathology results showing EXCISION, EPIDERMOID CYST, MARGINS FREE at right mid back paraspinal  - Patient advised to keep steri-strips dry until they fall off. - Scars remodel for a full year. - Once steri-strips fall off, patient can apply over-the-counter silicone scar cream once to twice a day to help with scar remodeling if desired. - Patient advised to call with any concerns or if they notice any new or changing lesions.  Return for As scheduled, w/ Dr. Gwen Pounds.  Wynonia Lawman, CMA, am acting as scribe for Elie Goody, MD .   Documentation: I have reviewed the above documentation for accuracy and completeness, and I agree with the above.  Elie Goody, MD

## 2023-04-04 NOTE — Patient Instructions (Signed)

## 2023-04-23 ENCOUNTER — Other Ambulatory Visit: Payer: Self-pay | Admitting: *Deleted

## 2023-04-23 DIAGNOSIS — Z87891 Personal history of nicotine dependence: Secondary | ICD-10-CM

## 2023-04-23 DIAGNOSIS — Z122 Encounter for screening for malignant neoplasm of respiratory organs: Secondary | ICD-10-CM

## 2023-05-10 ENCOUNTER — Telehealth: Payer: Self-pay

## 2023-05-10 MED ORDER — KETOCONAZOLE 2 % EX CREA: TOPICAL_CREAM | CUTANEOUS | 11 refills | Status: AC

## 2023-05-10 NOTE — Telephone Encounter (Signed)
 Discussed new prescription and condition with pt's husband [name].

## 2023-05-10 NOTE — Addendum Note (Signed)
 Addended by: Mara [place] A on: 05/10/2023 04:49 PM   Modules accepted: Orders

## 2023-05-10 NOTE — Telephone Encounter (Signed)
 Patient requesting refills of Nystatin powder, but I do not see that we've prescribed this or discussed intertrigo since switching to Epic. Pt states that she frequently gets a yeast infection under her L breast. Please advise.

## 2023-06-11 ENCOUNTER — Other Ambulatory Visit: Payer: Self-pay | Admitting: Dermatology

## 2023-06-11 DIAGNOSIS — L309 Dermatitis, unspecified: Secondary | ICD-10-CM

## 2023-07-22 ENCOUNTER — Emergency Department

## 2023-07-22 ENCOUNTER — Other Ambulatory Visit: Payer: Self-pay

## 2023-07-22 ENCOUNTER — Inpatient Hospital Stay
Admission: EM | Admit: 2023-07-22 | Discharge: 2023-07-25 | DRG: 872 | Disposition: A | Discharge status: Home / Self Care | Attending: Internal Medicine | Admitting: Internal Medicine

## 2023-07-22 DIAGNOSIS — J449 Chronic obstructive pulmonary disease, unspecified: Secondary | ICD-10-CM | POA: Diagnosis present

## 2023-07-22 DIAGNOSIS — I272 Pulmonary hypertension, unspecified: Secondary | ICD-10-CM | POA: Diagnosis present

## 2023-07-22 DIAGNOSIS — E871 Hypo-osmolality and hyponatremia: Secondary | ICD-10-CM | POA: Diagnosis present

## 2023-07-22 DIAGNOSIS — Z860100 Personal history of colon polyps, unspecified: Secondary | ICD-10-CM

## 2023-07-22 DIAGNOSIS — A4159 Other Gram-negative sepsis: Principal | ICD-10-CM | POA: Diagnosis present

## 2023-07-22 DIAGNOSIS — A419 Sepsis, unspecified organism: Secondary | ICD-10-CM | POA: Diagnosis present

## 2023-07-22 DIAGNOSIS — E785 Hyperlipidemia, unspecified: Secondary | ICD-10-CM | POA: Diagnosis present

## 2023-07-22 DIAGNOSIS — Z8616 Personal history of COVID-19: Secondary | ICD-10-CM

## 2023-07-22 DIAGNOSIS — N261 Atrophy of kidney (terminal): Secondary | ICD-10-CM | POA: Diagnosis present

## 2023-07-22 DIAGNOSIS — I2510 Atherosclerotic heart disease of native coronary artery without angina pectoris: Secondary | ICD-10-CM | POA: Diagnosis present

## 2023-07-22 DIAGNOSIS — E860 Dehydration: Secondary | ICD-10-CM | POA: Diagnosis present

## 2023-07-22 DIAGNOSIS — N182 Chronic kidney disease, stage 2 (mild): Secondary | ICD-10-CM | POA: Diagnosis present

## 2023-07-22 DIAGNOSIS — Z87891 Personal history of nicotine dependence: Secondary | ICD-10-CM | POA: Diagnosis not present

## 2023-07-22 DIAGNOSIS — Z90710 Acquired absence of both cervix and uterus: Secondary | ICD-10-CM

## 2023-07-22 DIAGNOSIS — R6520 Severe sepsis without septic shock: Secondary | ICD-10-CM | POA: Diagnosis present

## 2023-07-22 DIAGNOSIS — J209 Acute bronchitis, unspecified: Secondary | ICD-10-CM | POA: Diagnosis present

## 2023-07-22 DIAGNOSIS — Z9981 Dependence on supplemental oxygen: Secondary | ICD-10-CM

## 2023-07-22 DIAGNOSIS — Z6825 Body mass index (BMI) 25.0-25.9, adult: Secondary | ICD-10-CM

## 2023-07-22 DIAGNOSIS — I700 Atherosclerosis of aorta: Secondary | ICD-10-CM | POA: Diagnosis present

## 2023-07-22 DIAGNOSIS — I739 Peripheral vascular disease, unspecified: Secondary | ICD-10-CM | POA: Diagnosis present

## 2023-07-22 DIAGNOSIS — N136 Pyonephrosis: Secondary | ICD-10-CM | POA: Diagnosis present

## 2023-07-22 DIAGNOSIS — N390 Urinary tract infection, site not specified: Principal | ICD-10-CM | POA: Diagnosis present

## 2023-07-22 DIAGNOSIS — Z85828 Personal history of other malignant neoplasm of skin: Secondary | ICD-10-CM

## 2023-07-22 DIAGNOSIS — Z888 Allergy status to other drugs, medicaments and biological substances status: Secondary | ICD-10-CM

## 2023-07-22 DIAGNOSIS — J9611 Chronic respiratory failure with hypoxia: Secondary | ICD-10-CM | POA: Diagnosis present

## 2023-07-22 DIAGNOSIS — K8020 Calculus of gallbladder without cholecystitis without obstruction: Secondary | ICD-10-CM | POA: Diagnosis present

## 2023-07-22 DIAGNOSIS — E872 Acidosis, unspecified: Secondary | ICD-10-CM | POA: Diagnosis present

## 2023-07-22 DIAGNOSIS — Z885 Allergy status to narcotic agent status: Secondary | ICD-10-CM

## 2023-07-22 DIAGNOSIS — D696 Thrombocytopenia, unspecified: Secondary | ICD-10-CM | POA: Insufficient documentation

## 2023-07-22 DIAGNOSIS — I5032 Chronic diastolic (congestive) heart failure: Secondary | ICD-10-CM | POA: Diagnosis present

## 2023-07-22 DIAGNOSIS — J440 Chronic obstructive pulmonary disease with acute lower respiratory infection: Secondary | ICD-10-CM | POA: Diagnosis present

## 2023-07-22 DIAGNOSIS — Z9049 Acquired absence of other specified parts of digestive tract: Secondary | ICD-10-CM

## 2023-07-22 DIAGNOSIS — R0602 Shortness of breath: Secondary | ICD-10-CM | POA: Diagnosis present

## 2023-07-22 DIAGNOSIS — Z881 Allergy status to other antibiotic agents status: Secondary | ICD-10-CM

## 2023-07-22 DIAGNOSIS — E876 Hypokalemia: Secondary | ICD-10-CM | POA: Diagnosis not present

## 2023-07-22 DIAGNOSIS — Z853 Personal history of malignant neoplasm of breast: Secondary | ICD-10-CM

## 2023-07-22 DIAGNOSIS — Z841 Family history of disorders of kidney and ureter: Secondary | ICD-10-CM

## 2023-07-22 DIAGNOSIS — Z9011 Acquired absence of right breast and nipple: Secondary | ICD-10-CM

## 2023-07-22 DIAGNOSIS — Z1629 Resistance to other single specified antibiotic: Secondary | ICD-10-CM | POA: Diagnosis present

## 2023-07-22 DIAGNOSIS — E663 Overweight: Secondary | ICD-10-CM | POA: Diagnosis present

## 2023-07-22 LAB — BASIC METABOLIC PANEL WITH GFR
Anion gap: 11 (ref 5–15)
BUN: 16 mg/dL (ref 8–23)
CO2: 26 mmol/L (ref 22–32)
Calcium: 9.1 mg/dL (ref 8.9–10.3)
Chloride: 96 mmol/L — ABNORMAL LOW (ref 98–111)
Creatinine, Ser: 1.1 mg/dL — ABNORMAL HIGH (ref 0.44–1.00)
GFR, Estimated: 53 mL/min — ABNORMAL LOW (ref >60)
Glucose, Bld: 144 mg/dL — ABNORMAL HIGH (ref 70–99)
Potassium: 4 mmol/L (ref 3.5–5.1)
Sodium: 133 mmol/L — ABNORMAL LOW (ref 135–145)

## 2023-07-22 LAB — CBC
HCT: 45.7 % (ref 36.0–46.0)
Hemoglobin: 15.1 g/dL — ABNORMAL HIGH (ref 12.0–15.0)
MCH: 28.9 pg (ref 26.0–34.0)
MCHC: 33 g/dL (ref 30.0–36.0)
MCV: 87.5 fL (ref 80.0–100.0)
Platelets: 208 K/uL (ref 150–400)
RBC: 5.22 MIL/uL — ABNORMAL HIGH (ref 3.87–5.11)
RDW: 14.2 % (ref 11.5–15.5)
WBC: 17.1 K/uL — ABNORMAL HIGH (ref 4.0–10.5)
nRBC: 0 % (ref 0.0–0.2)

## 2023-07-22 LAB — TROPONIN I (HIGH SENSITIVITY)
Troponin I (High Sensitivity): 6 ng/L (ref <18)
Troponin I (High Sensitivity): 8 ng/L (ref <18)

## 2023-07-22 LAB — LACTIC ACID, PLASMA
Lactic Acid, Venous: 1.7 mmol/L (ref 0.5–1.9)
Lactic Acid, Venous: 2.4 mmol/L (ref 0.5–1.9)

## 2023-07-22 LAB — URINALYSIS, ROUTINE W REFLEX MICROSCOPIC
Bilirubin Urine: NEGATIVE
Glucose, UA: NEGATIVE mg/dL
Ketones, ur: NEGATIVE mg/dL
Nitrite: NEGATIVE
Protein, ur: 30 mg/dL — AB
Specific Gravity, Urine: 1.017 (ref 1.005–1.030)
Squamous Epithelial / HPF: 0 /HPF (ref 0–5)
WBC, UA: >50 WBC/hpf (ref 0–5)
pH: 6 (ref 5.0–8.0)

## 2023-07-22 MED ORDER — ALBUTEROL SULFATE (2.5 MG/3ML) 0.083% IN NEBU: 2.5000 mg | INHALATION_SOLUTION | Freq: Every day | RESPIRATORY_TRACT | Status: DC | PRN

## 2023-07-22 MED ORDER — SODIUM CHLORIDE 0.9 % IV SOLN
1.0000 g | Freq: Two times a day (BID) | INTRAVENOUS | Status: DC
  Administered 2023-07-22 – 2023-07-24 (×4): 1 g via INTRAVENOUS
  Filled 2023-07-22 (×4): qty 20

## 2023-07-22 MED ORDER — ONDANSETRON HCL 4 MG/2ML IJ SOLN: 4.0000 mg | Freq: Once | INTRAMUSCULAR | Status: AC

## 2023-07-22 MED ORDER — SODIUM CHLORIDE 0.9 % IV BOLUS: 1000.0000 mL | Freq: Once | INTRAVENOUS | Status: DC

## 2023-07-22 MED ORDER — PREDNISOLONE ACETATE 1 % OP SUSP
1.0000 [drp] | Freq: Four times a day (QID) | OPHTHALMIC | Status: DC
  Filled 2023-07-22: qty 5

## 2023-07-22 MED ORDER — ACETAMINOPHEN 650 MG RE SUPP: 650.0000 mg | Freq: Four times a day (QID) | RECTAL | Status: DC | PRN

## 2023-07-22 MED ORDER — ENOXAPARIN SODIUM 40 MG/0.4ML IJ SOSY
40.0000 mg | PREFILLED_SYRINGE | INTRAMUSCULAR | Status: DC
  Administered 2023-07-22 – 2023-07-24 (×3): 40 mg via SUBCUTANEOUS
  Filled 2023-07-22 (×3): qty 0.4

## 2023-07-22 MED ORDER — LACTATED RINGERS IV SOLN
150.0000 mL/h | INTRAVENOUS | Status: AC
  Administered 2023-07-22: 150 mL/h via INTRAVENOUS

## 2023-07-22 MED ORDER — ACETAMINOPHEN 325 MG PO TABS
650.0000 mg | ORAL_TABLET | Freq: Four times a day (QID) | ORAL | Status: DC | PRN
Start: 2023-07-22 — End: 2023-07-25
  Administered 2023-07-22 – 2023-07-23 (×2): 650 mg via ORAL
  Filled 2023-07-22 (×2): qty 2

## 2023-07-22 MED ORDER — LACTATED RINGERS IV BOLUS
1000.0000 mL | Freq: Once | INTRAVENOUS | Status: AC
  Administered 2023-07-22: 1000 mL via INTRAVENOUS

## 2023-07-22 MED ORDER — ADULT MULTIVITAMIN W/MINERALS CH
1.0000 | ORAL_TABLET | Freq: Every day | ORAL | Status: DC
  Administered 2023-07-23 – 2023-07-25 (×4): 1 via ORAL
  Filled 2023-07-22 (×4): qty 1

## 2023-07-22 MED ORDER — SODIUM CHLORIDE 0.9 % IV SOLN
2.0000 g | Freq: Once | INTRAVENOUS | Status: AC
  Administered 2023-07-22: 2 g via INTRAVENOUS
  Filled 2023-07-22: qty 20

## 2023-07-22 MED ORDER — FLUTICASONE FUROATE-VILANTEROL 100-25 MCG/ACT IN AEPB
1.0000 | INHALATION_SPRAY | Freq: Every day | RESPIRATORY_TRACT | Status: DC
  Administered 2023-07-23 – 2023-07-25 (×3): 1 via RESPIRATORY_TRACT
  Filled 2023-07-22 (×2): qty 28

## 2023-07-22 MED ORDER — ONDANSETRON HCL 4 MG/2ML IJ SOLN: 4.0000 mg | Freq: Four times a day (QID) | INTRAMUSCULAR | Status: DC | PRN

## 2023-07-22 MED ORDER — TACROLIMUS 0.1 % EX OINT: TOPICAL_OINTMENT | CUTANEOUS | Status: DC

## 2023-07-22 MED ORDER — ONDANSETRON HCL 4 MG PO TABS
4.0000 mg | ORAL_TABLET | Freq: Four times a day (QID) | ORAL | Status: DC | PRN
  Filled 2023-07-22: qty 1

## 2023-07-22 MED ORDER — IPRATROPIUM-ALBUTEROL 0.5-2.5 (3) MG/3ML IN SOLN: 3.0000 mL | Freq: Four times a day (QID) | RESPIRATORY_TRACT | Status: DC | PRN

## 2023-07-22 MED ORDER — ONDANSETRON HCL 4 MG/2ML IJ SOLN
INTRAMUSCULAR | Status: AC
  Administered 2023-07-22: 4 mg via INTRAVENOUS
  Filled 2023-07-22: qty 2

## 2023-07-22 NOTE — Assessment & Plan Note (Addendum)
 With chronic respiratory failure on 2 L of oxygen continuous Acutely exacerbated with acute on chronic bronchitis Continue as needed bronchodilator therapy Will add inhaled steroids

## 2023-07-22 NOTE — Assessment & Plan Note (Addendum)
 Patient admitted to the hospital for severe sepsis from a urinary source/pyelonephritis as evidenced by tachycardia, tachypnea, marked leukocytosis, lactic acidosis and pyuria. Chart review shows urine culture that yielded E. coli resistant to cephalosporins.  Preliminary blood cultures negative with pending urine cultures.  CT renal stone study with concern of ascending UTI, also has an history of nonfunctional and atrophic right kidney. Lactic acidosis improving but pending for this morning -Continue with meropenem  for now while waiting for cultures. -Continue with IV fluid -Follow-up on cultures

## 2023-07-22 NOTE — H&P (Addendum)
 History and Physical    Patient: Brianna Briggs FMW:[id number] DOB: [birth year] DOA: 07/22/2023 DOS: the patient was seen and examined on 07/22/2023 PCP: Lenon Layman ORN, MD  Patient coming from: Home  Chief Complaint:  Chief Complaint  Patient presents with   Chest Pain   Shortness of Breath   Cough   HPI: Brianna Briggs is a 73 y.o. female with medical history significant for COPD with chronic respiratory failure on 2 L of oxygen continuous, history of breast cancer status post right mastectomy, chronic diastolic dysfunction CHF, diverticulosis, depression, migraine headaches, solitary kidney who presents to the emergency room for evaluation of shortness of breath, which initially she had with exertion but now at rest as well as palpitations. She notes that she had a recent exacerbation of her underlying COPD and was treated with systemic steroids, antibiotics and bronchodilator therapy.  She completed a 7-day course of steroids and antibiotics and initially had some relief but then her symptoms recurred.  She complains of a cough productive of yellow phlegm associated with shortness of breath and chest discomfort mostly when she coughs.  She also complains of frequency and dysuria but denies having any hematuria.  She has abdominal pain mostly in the left lumbar area which she rates a 7 x 10 in intensity at its worst.  Pain is nonradiating. She was seen at the urgent care and was noted to be tachycardic with heart rates in the 140s and was advised to come to the ER for further evaluation. Upon arrival to the ER she was noted to be tachycardic and twelve-lead EKG showed sinus tachycardia, she was tachypneic her blood pressure was within normal limits. She denies having any headache, no nausea, no vomiting, no changes in her bowel habits, no dizziness, no lightheadedness, no blurred vision or any focal deficit. Abnormal labs include a lactate of 2.4, pyuria, sodium 133, creatinine  1.10, white count 17.1, hemoglobin 15.1.   Chest x-ray reviewed by me shows hyperinflated lungs.  No infiltrate or effusion. She received a dose of IV Rocephin  in the ER, 2 L fluid bolus and will be admitted to the hospital for further evaluation   Review of Systems: As mentioned in the history of present illness. All other systems reviewed and are negative. Past Medical History:  Diagnosis Date   Acute on chronic respiratory failure with hypoxia (HCC)    Allergic genetic state    Breast cancer, right (HCC) 1996   a.) s/p total mastectomy   Bronchitis    Chronic eczema    COPD (chronic obstructive pulmonary disease) (HCC)    Coronary artery calcification seen on CT scan    Dependence on nocturnal oxygen therapy    a.) 2 L/   Diastolic dysfunction    a.) TTE 02/28/2021: EF 50%, triv MR/TR, G1DD; b.) TEE 07/16/2021: EF 55-60%, G1DD.   Diverticulitis of large intestine with abscess without bleeding    DOE (dyspnea on exertion)    History of 2019 novel coronavirus disease (COVID-19) 08/2021   History of chicken pox    History of colon polyps    Hyperlipidemia    Hypophosphatemia    Hypotension    MDD (major depressive disorder)    Migraines    Perforated diverticulum of intestine    Peripheral vascular disease with claudication (HCC)    PSVT (paroxysmal supraventricular tachycardia) (HCC)    PVC (premature ventricular contraction)    Severe sepsis with lactic acidosis (HCC)    Single kidney  Squamous cell carcinoma of skin 01/27/2019   Right inferior lateral thigh. KA-type   Thoracic aortic atherosclerosis (HCC)    Tobacco use    Past Surgical History:  Procedure Laterality Date   ABDOMINAL HYSTERECTOMY     COLON SURGERY     COLONOSCOPY N/A 05/22/2022   Procedure: COLONOSCOPY;  Surgeon: Onita Elspeth Sharper, DO;  Location: [facility] ENDOSCOPY;  Service: Gastroenterology;  Laterality: N/A;   COLONOSCOPY WITH PROPOFOL  N/A 05/01/2019   Procedure: COLONOSCOPY WITH PROPOFOL ;   Surgeon: Toledo, Ladell POUR, MD;  Location: [facility] ENDOSCOPY;  Service: Gastroenterology;  Laterality: N/A;   ESOPHAGOGASTRODUODENOSCOPY N/A 05/22/2022   Procedure: ESOPHAGOGASTRODUODENOSCOPY (EGD);  Surgeon: Onita Elspeth Sharper, DO;  Location: [facility] ENDOSCOPY;  Service: Gastroenterology;  Laterality: N/A;   ESOPHAGOGASTRODUODENOSCOPY (EGD) WITH PROPOFOL  N/A 05/01/2019   Procedure: ESOPHAGOGASTRODUODENOSCOPY (EGD) WITH PROPOFOL ;  Surgeon: Toledo, Ladell POUR, MD;  Location: [facility] ENDOSCOPY;  Service: Gastroenterology;  Laterality: N/A;   HERNIA REPAIR     ILEOSTOMY CLOSURE N/A 12/12/2021   Procedure: ILEOSTOMY TAKEDOWN;  Surgeon: Rodolph Romano, MD;  Location: [facility] ORS;  Service: General;  Laterality: N/A;   IR CATHETER TUBE CHANGE  06/29/2021   IR RADIOLOGIST EVAL & MGMT  06/23/2021   IR RADIOLOGIST EVAL & MGMT  07/14/2021   LAPAROTOMY N/A 07/31/2021   Procedure: EXPLORATORY LAPAROTOMY;  Surgeon: Rodolph Romano, MD;  Location: [facility] ORS;  Service: General;  Laterality: N/A;   MASTECTOMY Right    PARASTOMAL HERNIA REPAIR N/A 12/12/2021   Procedure: HERNIA REPAIR PARASTOMAL;  Surgeon: Rodolph Romano, MD;  Location: [facility] ORS;  Service: General;  Laterality: N/A;   PARTIAL COLECTOMY N/A 07/31/2021   Procedure: GREGORY ILEOSTOMY;  Surgeon: Rodolph Romano, MD;  Location: [facility] ORS;  Service: General;  Laterality: N/A;   SHOULDER ARTHROSCOPY WITH SUBACROMIAL DECOMPRESSION, ROTATOR CUFF REPAIR AND BICEP TENDON REPAIR Left 01/13/2008   TONSILLECTOMY     TUBAL LIGATION     Social History:  reports that she quit smoking about 2 years ago. Her smoking use included cigarettes. She started smoking about 42 years ago. She has a 60 pack-year smoking history. She has never used smokeless tobacco. She reports that she does not drink alcohol and does not use drugs.  Allergies  Allergen Reactions   Epinephrine  Shortness Of Breath   Amoxicillin -Pot Clavulanate Diarrhea and Rash    TOLERATED  CEFAZOLIN  Other reaction(s): Unknown   Biaxin [Clarithromycin] Diarrhea    Other reaction(s): Unknown   Oxycodone  Itching   Codeine Rash   Nitrofurantoin Rash    Family History  Problem Relation Age of Onset   Cancer Father    Kidney failure Mother     Prior to Admission medications   Medication Sig Start Date End Date Taking? Authorizing Provider  Cholecalciferol  25 MCG (1000 UT) capsule Take 2,000 Units by mouth daily. 04/27/21   [provider]  gatifloxacin (ZYMAXID) 0.5 % SOLN Place 1 drop into the left eye 3 (three) times daily. 08/07/22   [provider]  guaiFENesin -dextromethorphan  (ROBITUSSIN DM) 100-10 MG/5ML syrup Take 10 mLs by mouth every 4 (four) hours as needed for cough. 08/16/21   Fausto Burnard LABOR, DO  HYDROcodone -acetaminophen  (NORCO/VICODIN) 5-325 MG tablet Take 1 tablet by mouth every 4 (four) hours as needed for moderate pain. Patient not taking: Reported on 04/04/2023 12/16/21   Rodolph Romano, MD  ketoconazole  (NIZORAL ) 2 % cream Apply to aa's rash under breast BID PRN. 05/10/23   Hester Alm BROCKS, MD  ketoconazole  (NIZORAL ) 2 % shampoo Apply 1 Application  topically 3 (three) times a week. Wash scalp 3 times a week, let sit 5 minutes and rinse out, for seborrheic dermatitis 04/24/22   Hester Alm BROCKS, MD  ketorolac  (ACULAR ) 0.5 % ophthalmic solution Place 1 drop into the left eye 4 (four) times daily. 08/07/22   [provider]  minoxidil  (LONITEN ) 2.5 MG tablet Take 1/2 tablet once daily Patient not taking: Reported on 04/04/2023 01/30/22   Hester Alm BROCKS, MD  mometasone  (ELOCON ) 0.1 % cream APPLY ONCE DAILY AS DIRECTED UP TO 5 DAYS PER WEEK AS NEEDED 06/12/23   Hester Alm BROCKS, MD  Multiple Vitamin (MULTIVITAMIN) tablet Take 2 tablets by mouth daily.    [provider]  mupirocin  ointment (BACTROBAN ) 2 % Apply 1 Application topically daily. 03/28/23   Claudene Lehmann, MD  OXYGEN Inhale 2 L into the lungs at bedtime.     [provider]  polyethylene glycol-electrolytes (NULYTELY ) 420 g solution See admin instructions. 05/15/22   [provider]  prednisoLONE  acetate (PRED FORTE ) 1 % ophthalmic suspension Place 1 drop into the left eye 4 (four) times daily. 08/07/22   [provider]  predniSONE  (DELTASONE ) 2.5 MG tablet Take by mouth. 03/02/23   [provider]  tacrolimus  (PROTOPIC ) 0.1 % ointment Apply topically as directed. Qd to bid to aa hands  for eczema prn flares 04/24/22   Hester Alm BROCKS, MD  VENTOLIN  HFA 108 [phone] Base) MCG/ACT inhaler Inhale 2 puffs into the lungs every evening. 04/12/21   [provider]    Physical Exam: Vitals:   07/22/23 1520 07/22/23 1600 07/22/23 1630 07/22/23 1800  BP: 100/73 110/62 106/83 123/80  Pulse:  (!) 120  (!) 139  Resp:  (!) 23 18 (!) 23  Temp:    99 F (37.2 C)  TempSrc:    Oral  SpO2:  96%  100%  Weight:      Height:       Physical Exam Vitals and nursing note reviewed.  Constitutional:      Appearance: She is well-developed.     Comments: Acutely ill-appearing  HENT:     Head: Normocephalic and atraumatic.  Cardiovascular:     Rate and Rhythm: Tachycardia present.     Heart sounds: Normal heart sounds.  Pulmonary:     Effort: Pulmonary effort is normal.  Abdominal:     General: Bowel sounds are normal.     Palpations: Abdomen is soft.     Comments: left CVA tenderness  Musculoskeletal:        General: Normal range of motion.     Cervical back: Normal range of motion and neck supple.  Skin:    General: Skin is warm and dry.  Neurological:     General: No focal deficit present.     Mental Status: She is alert and oriented to person, place, and time.  Psychiatric:        Mood and Affect: Mood normal.        Behavior: Behavior normal.     Data Reviewed: Relevant notes from primary care and specialist visits, past discharge summaries as available in EHR, including Care Everywhere. Prior diagnostic  testing as pertinent to current admission diagnoses Updated medications and problem lists for reconciliation ED course, including vitals, labs, imaging, treatment and response to treatment Triage notes, nursing and pharmacy notes and ED provider's notes Notable results as noted in HPI Labs reviewed.  Lactic acid 2.4, troponin 8, sodium 133, potassium 4.0, chloride 96, bicarb 26,  glucose 144, BUN 16, creatinine 1.3, calcium  8.1, white count 17.1, hemoglobin 15.1, hematocrit 45.7, platelet count 2 8   Assessment and Plan: * Sepsis secondary to UTI [facility]) Patient admitted to the hospital for sepsis from a urinary source/pyelonephritis as evidenced by tachycardia, tachypnea, marked leukocytosis, lactic acidosis and pyuria. Chart review shows urine culture that yielded E. coli resistant to cephalosporins Will place patient empirically on meropenem  while awaiting results of urine and blood cultures Continue aggressive IV fluid resuscitation Trend lactic acid levels   Chronic respiratory failure with hypoxia (HCC) Treatment is outlined in 2  Chronic obstructive pulmonary disease (COPD) (HCC) With chronic respiratory failure on 2 L of oxygen continuous Acutely exacerbated with acute on chronic bronchitis Continue as needed bronchodilator therapy Will add inhaled steroids      Advance Care Planning:   Code Status: Full Code   Consults: None  Family Communication: Plan of care was discussed with patient at the bedside.  She verbalizes understanding and agrees with the plan.  Severity of Illness: The appropriate patient status for this patient is INPATIENT. Inpatient status is judged to be reasonable and necessary in order to provide the required intensity of service to ensure the patient's safety. The patient's presenting symptoms, physical exam findings, and initial radiographic and laboratory data in the context of their chronic comorbidities is felt to place them at high risk for further  clinical deterioration. Furthermore, it is not anticipated that the patient will be medically stable for discharge from the hospital within 2 midnights of admission.   * I certify that at the point of admission it is my clinical judgment that the patient will require inpatient hospital care spanning beyond 2 midnights from the point of admission due to high intensity of service, high risk for further deterioration and high frequency of surveillance required.*  Author: Aimee Somerset, MD 07/22/2023 7:26 PM  For on call review [email or web address].

## 2023-07-22 NOTE — ED Notes (Signed)
 Blue top sent. 20# placed. R arm is restricted.

## 2023-07-22 NOTE — Assessment & Plan Note (Signed)
 Treatment is outlined in 2

## 2023-07-22 NOTE — ED Triage Notes (Signed)
 Cough, SOB, chest pain since about 3 weeks ago  HR at [facility] in 140s, no EKG done there  Wears 2L at night, has been wearing during the day  Negative covid, flu, RSV testing at [facility] 1.5 weeks ago  HR 146 in triage on EKG, ST. On 2L at this time. Pt also saying her kidney is hurting and has only L kidney. Also burning with urination since 2 days.  Respirations are unlabored.

## 2023-07-22 NOTE — ED Notes (Addendum)
 Pt not in room. Per triage RN patient in XRAY and will come to room 4 after XRAY

## 2023-07-22 NOTE — ED Triage Notes (Signed)
 First nurse note: Pt to ED via POV from [facility]. Pt sent over for possible afib and SOB. Pt HR 147. KC did not do EKG prior to bringing pt over.   Pt reports symptoms x10days. Pt has been on breathing tx 4x daily without relief.   KC Vitals 121/82 HR 147 97% RA 99.6 oral

## 2023-07-22 NOTE — ED Provider Notes (Signed)
 [facility] Provider Note    Event Date/Time   First MD Initiated Contact with Patient 07/22/23 1421     (approximate)   History   Chest Pain, Shortness of Breath, and Cough   HPI {Remember to add pertinent medical, surgical, social, and/or OB history to HPI:1} Brianna Briggs is a 73 y.o. female  ***       Physical Exam   Triage Vital Signs: ED Triage Vitals  Encounter Vitals Group     BP 07/22/23 1401 109/79     Girls Systolic BP Percentile --      Girls Diastolic BP Percentile --      Boys Systolic BP Percentile --      Boys Diastolic BP Percentile --      Pulse Rate 07/22/23 1401 (!) 146     Resp 07/22/23 1401 20     Temp 07/22/23 1401 99.4 F (37.4 C)     Temp Source 07/22/23 1401 Oral     SpO2 07/22/23 1401 96 %     Weight 07/22/23 1351 151 lb (68.5 kg)     Height 07/22/23 1351 5' 2 (1.575 m)     Head Circumference --      Peak Flow --      Pain Score --      Pain Loc --      Pain Education --      Exclude from Growth Chart --     Most recent vital signs: Vitals:   07/22/23 1401  BP: 109/79  Pulse: (!) 146  Resp: 20  Temp: 99.4 F (37.4 C)  SpO2: 96%    {Only need to document appropriate and relevant physical exam:1} General: Awake, no distress. *** CV:  Good peripheral perfusion. *** Resp:  Normal effort. *** Abd:  No distention. *** Other:  ***   ED Results / Procedures / Treatments   Labs (all labs ordered are listed, but only abnormal results are displayed) Labs Reviewed  CBC - Abnormal; Notable for the following components:      Result Value   WBC 17.1 (*)    RBC 5.22 (*)    Hemoglobin 15.1 (*)    All other components within normal limits  BASIC METABOLIC PANEL WITH GFR  TROPONIN I (HIGH SENSITIVITY)     EKG  I, Guadalupe Eagles, attending physician, personally viewed and interpreted this EKG  EKG Time: 1356 Rate: 146 Rhythm: sinus tachycardia Axis: normal Intervals: qtc 517 QRS:  narrow ST changes: no st elevation Impression: abnormal ekg   RADIOLOGY I independently interpreted and visualized the CXR. My interpretation: No pneumonia Radiology interpretation: ***    PROCEDURES:  Critical Care performed: Yes  CRITICAL CARE Performed by: Guadalupe Eagles   Total critical care time: *** minutes  Critical care time was exclusive of separately billable procedures and treating other patients.  Critical care was necessary to treat or prevent imminent or life-threatening deterioration.  Critical care was time spent personally by me on the following activities: development of treatment plan with patient and/or surrogate as well as nursing, discussions with consultants, evaluation of patient's response to treatment, examination of patient, obtaining history from patient or surrogate, ordering and performing treatments and interventions, ordering and review of laboratory studies, ordering and review of radiographic studies, pulse oximetry and re-evaluation of patient's condition.   Procedures    MEDICATIONS ORDERED IN ED: Medications - No data to display   IMPRESSION / MDM / ASSESSMENT AND PLAN / ED  COURSE  I reviewed the triage vital signs and the nursing notes.                              Differential diagnosis includes, but is not limited to, ***  Patient's presentation is most consistent with {EM COPA:27473}   ***The patient is on the cardiac monitor to evaluate for evidence of arrhythmia and/or significant heart rate changes.  ***      FINAL CLINICAL IMPRESSION(S) / ED DIAGNOSES   Final diagnoses:  None        Rx / DC Orders   ED Discharge Orders     None        Note:  This document was prepared using Dragon voice recognition software and may include unintentional dictation errors.

## 2023-07-23 ENCOUNTER — Inpatient Hospital Stay

## 2023-07-23 DIAGNOSIS — J9611 Chronic respiratory failure with hypoxia: Secondary | ICD-10-CM

## 2023-07-23 DIAGNOSIS — J449 Chronic obstructive pulmonary disease, unspecified: Secondary | ICD-10-CM | POA: Diagnosis not present

## 2023-07-23 DIAGNOSIS — N390 Urinary tract infection, site not specified: Secondary | ICD-10-CM | POA: Diagnosis not present

## 2023-07-23 DIAGNOSIS — A419 Sepsis, unspecified organism: Secondary | ICD-10-CM | POA: Diagnosis not present

## 2023-07-23 LAB — BASIC METABOLIC PANEL WITH GFR
Anion gap: 11 (ref 5–15)
BUN: 14 mg/dL (ref 8–23)
CO2: 28 mmol/L (ref 22–32)
Calcium: 8.3 mg/dL — ABNORMAL LOW (ref 8.9–10.3)
Chloride: 98 mmol/L (ref 98–111)
Creatinine, Ser: 1.22 mg/dL — ABNORMAL HIGH (ref 0.44–1.00)
GFR, Estimated: 47 mL/min — ABNORMAL LOW (ref >60)
Glucose, Bld: 112 mg/dL — ABNORMAL HIGH (ref 70–99)
Potassium: 4.1 mmol/L (ref 3.5–5.1)
Sodium: 137 mmol/L (ref 135–145)

## 2023-07-23 LAB — TSH: TSH: 0.311 u[IU]/mL — ABNORMAL LOW (ref 0.350–4.500)

## 2023-07-23 LAB — LACTIC ACID, PLASMA: Lactic Acid, Venous: 1.2 mmol/L (ref 0.5–1.9)

## 2023-07-23 LAB — BRAIN NATRIURETIC PEPTIDE: B Natriuretic Peptide: 297.6 pg/mL — ABNORMAL HIGH (ref 0.0–100.0)

## 2023-07-23 LAB — CBC
HCT: 39.2 % (ref 36.0–46.0)
Hemoglobin: 12.6 g/dL (ref 12.0–15.0)
MCH: 29.2 pg (ref 26.0–34.0)
MCHC: 32.1 g/dL (ref 30.0–36.0)
MCV: 91 fL (ref 80.0–100.0)
Platelets: 147 K/uL — ABNORMAL LOW (ref 150–400)
RBC: 4.31 MIL/uL (ref 3.87–5.11)
RDW: 14.3 % (ref 11.5–15.5)
WBC: 18.8 K/uL — ABNORMAL HIGH (ref 4.0–10.5)
nRBC: 0 % (ref 0.0–0.2)

## 2023-07-23 LAB — PROTIME-INR
INR: 1.3 — ABNORMAL HIGH (ref 0.8–1.2)
Prothrombin Time: 16.5 s — ABNORMAL HIGH (ref 11.4–15.2)

## 2023-07-23 MED ORDER — GUAIFENESIN-DM 100-10 MG/5ML PO SYRP
15.0000 mL | ORAL_SOLUTION | Freq: Three times a day (TID) | ORAL | Status: DC
  Administered 2023-07-23 – 2023-07-25 (×7): 15 mL via ORAL
  Filled 2023-07-23 (×7): qty 20

## 2023-07-23 MED ORDER — LACTATED RINGERS IV SOLN: INTRAVENOUS | Status: AC

## 2023-07-23 MED ORDER — SODIUM CHLORIDE 0.9 % IV BOLUS: 500.0000 mL | Freq: Once | INTRAVENOUS | Status: DC

## 2023-07-23 MED ORDER — SODIUM CHLORIDE 0.9 % IV BOLUS
500.0000 mL | Freq: Once | INTRAVENOUS | Status: AC
  Administered 2023-07-23: 500 mL via INTRAVENOUS

## 2023-07-23 NOTE — ED Notes (Signed)
Informed RN bed assigned 

## 2023-07-23 NOTE — ED Notes (Signed)
 Patient transported to CT

## 2023-07-23 NOTE — ED Notes (Signed)
 This NT assisted pt to the BR.

## 2023-07-23 NOTE — ED Notes (Signed)
 MD Tochukwu notified with regards to pt's blood pressure

## 2023-07-23 NOTE — Progress Notes (Signed)
 Progress Note   Patient: Brianna Briggs FMW:[id number] DOB: 1950-04-26 DOA: 07/22/2023     1 DOS: the patient was seen and examined on 07/23/2023   Brief hospital course: Taken from H&P.  Brianna Briggs is a 73 y.o. female with medical history significant for COPD with chronic respiratory failure on 2 L of oxygen continuous, history of breast cancer status post right mastectomy, chronic diastolic dysfunction CHF, diverticulosis, depression, migraine headaches, solitary kidney who presents to the emergency room for evaluation of shortness of breath, which initially she had with exertion but now at rest as well as palpitations.  She had a recent exacerbation of her underlying COPD and was treated with systemic steroids, antibiotics and bronchodilator therapy.  She completed a 7-day course of steroids and antibiotics and initially had some relief but does not think that she is back to her baseline yet.  She was also complaining of dysuria and increased frequency, no hematuria.  Also has lower abdominal pain mostly on left and left lumbar area pain.  On presentation afebrile, had tachycardia and tachypnea.  Saturating well on 2 L of oxygen which is her baseline.  Later became febrile up to 101, labs with lactic acid of 2.4, UA with pyuria, leukocytosis 17.1, creatinine 1.10, sodium 133. Chest x-ray with hyperinflated lungs, no acute abnormality.  She was started on meropenem  for concern of UTI, had history of resistant E. coli.  Patient met sepsis criteria with fever, leukocytosis, tachycardia and tachypnea.  Severe sepsis with lactic acidosis.  7/7: Vitals stable except mild tachycardia at 101, slight worsening of leukocytosis at 18.8, creatinine at 1.22, BNP and lactic acid was ordered-pending Because of her flank pain-CT renal stone study was done which shows some perinephric and periureteric stranding on the left consistent with either ascending UTI or a recently passed calculus.  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 Also noted punctate cholelithiasis without any evidence of cholecystitis.  Assessment and Plan: * Sepsis secondary to UTI [facility]) Patient admitted to the hospital for severe sepsis from a urinary source/pyelonephritis as evidenced by tachycardia, tachypnea, marked leukocytosis, lactic acidosis and pyuria. Chart review shows urine culture that yielded E. coli resistant to cephalosporins.  Preliminary blood cultures negative with pending urine cultures.  CT renal stone study with concern of ascending UTI, also has an history of nonfunctional and atrophic right kidney. Lactic acidosis improving but pending for this morning -Continue with meropenem  for now while waiting for cultures. -Continue with IV fluid -Follow-up on cultures  Chronic obstructive pulmonary disease (COPD) (HCC) History of recent exacerbation and completed a tapering course of steroid and doxycycline.  No wheezing during current hospitalization and remained on baseline oxygen. - Continue with bronchodilator -Continue with supportive care  Chronic respiratory failure with hypoxia (HCC) Treatment is outlined in 2   Subjective: Patient was still having some dull left flank pain and dysuria, stating it is slowly improving.  Per patient she just had 1 kidney.  No shortness of breath at rest or orthopnea.  Physical Exam: Vitals:   07/23/23 0705 07/23/23 0751 07/23/23 1030 07/23/23 1154  BP:  (!) 134/97  121/64  Pulse:  (!) 101  (!) 111  Resp: (!) 22 10  19   Temp:   98.6 F (37 C)   TempSrc:   Oral   SpO2:  (!) 86%  100%  Weight:      Height:       General.  Frail elderly lady, in no acute distress. Pulmonary.  Lungs clear bilaterally, normal respiratory effort. CV.  Regular rate and rhythm, no JVD, rub or murmur. Abdomen.  Soft, nontender, nondistended, BS positive. CNS.  Alert and oriented .  No focal neurologic  deficit. Extremities.  No edema,  pulses intact and symmetrical. Psychiatry.  Judgment and insight appears normal.   Data Reviewed: Prior data reviewed  Family Communication: Discussed with patient  Disposition: Status is: Inpatient Remains inpatient appropriate because: Severity of illness  Planned Discharge Destination: Home  DVT prophylaxis.  Lovenox  Time spent: 50 minutes  This record has been created using Conservation officer, historic buildings. Errors have been sought and corrected,but may not always be located. Such creation errors do not reflect on the standard of care.   Author: Amaryllis Dare, MD 07/23/2023 12:06 PM  For on call review [email or web address].

## 2023-07-23 NOTE — Hospital Course (Addendum)
 Taken from H&P.  Brianna Briggs is a 73 y.o. female with medical history significant for COPD with chronic respiratory failure on 2 L of oxygen continuous, history of breast cancer status post right mastectomy, chronic diastolic dysfunction CHF, diverticulosis, depression, migraine headaches, solitary kidney who presents to the emergency room for evaluation of shortness of breath, which initially she had with exertion but now at rest as well as palpitations.  She had a recent exacerbation of her underlying COPD and was treated with systemic steroids, antibiotics and bronchodilator therapy.  She completed a 7-day course of steroids and antibiotics and initially had some relief but does not think that she is back to her baseline yet.  She was also complaining of dysuria and increased frequency, no hematuria.  Also has lower abdominal pain mostly on left and left lumbar area pain.  On presentation afebrile, had tachycardia and tachypnea.  Saturating well on 2 L of oxygen which is her baseline.  Later became febrile up to 101, labs with lactic acid of 2.4, UA with pyuria, leukocytosis 17.1, creatinine 1.10, sodium 133. Chest x-ray with hyperinflated lungs, no acute abnormality.  She was started on meropenem  for concern of UTI, had history of resistant E. coli.  Patient met sepsis criteria with fever, leukocytosis, tachycardia and tachypnea.  Severe sepsis with lactic acidosis.  7/7: Vitals stable except mild tachycardia at 101, slight worsening of leukocytosis at 18.8, creatinine at 1.22, BNP and lactic acid was ordered-pending Because of her flank pain-CT renal stone study was done which shows some perinephric and periureteric stranding on the left consistent with either ascending UTI or a recently passed calculus.  Chronic severe atrophy and hydronephrosis of the right renal collecting system-per patient she only has 1 kidney. Also noted punctate cholelithiasis without any evidence of  cholecystitis.  7/8: Blood pressure borderline soft this morning-ordered 1 more bolus.  Lactic acidosis resolved.  Urine cultures with Proteus Mirabilis, only shows resistant to nitrofurantoin.  Blood cultures remain negative-switching antibiotics to Cipro 

## 2023-07-23 NOTE — ED Notes (Signed)
 Pt alert and oriented and ambulatory to bathroom without assistance. Pt able to call out for needs. Pt unhooked from bed alarm, and call light within safe pt reach.

## 2023-07-23 NOTE — ED Notes (Signed)
 Pt ambulatory to bathroom in room with one person assist.

## 2023-07-24 DIAGNOSIS — A419 Sepsis, unspecified organism: Secondary | ICD-10-CM | POA: Diagnosis not present

## 2023-07-24 DIAGNOSIS — J9611 Chronic respiratory failure with hypoxia: Secondary | ICD-10-CM | POA: Diagnosis not present

## 2023-07-24 DIAGNOSIS — N390 Urinary tract infection, site not specified: Secondary | ICD-10-CM | POA: Diagnosis not present

## 2023-07-24 DIAGNOSIS — J449 Chronic obstructive pulmonary disease, unspecified: Secondary | ICD-10-CM | POA: Diagnosis not present

## 2023-07-24 LAB — URINE CULTURE: Culture: >=100000 — AB

## 2023-07-24 LAB — CORTISOL-AM, BLOOD: Cortisol - AM: 10.8 ug/dL (ref 6.7–22.6)

## 2023-07-24 MED ORDER — LACTATED RINGERS IV BOLUS
1000.0000 mL | Freq: Once | INTRAVENOUS | Status: AC
  Administered 2023-07-24: 1000 mL via INTRAVENOUS

## 2023-07-24 MED ORDER — ALPRAZOLAM 0.25 MG PO TABS
0.2500 mg | ORAL_TABLET | Freq: Two times a day (BID) | ORAL | Status: DC | PRN
  Administered 2023-07-24: 0.25 mg via ORAL
  Filled 2023-07-24: qty 1

## 2023-07-24 MED ORDER — IPRATROPIUM-ALBUTEROL 0.5-2.5 (3) MG/3ML IN SOLN
3.0000 mL | Freq: Four times a day (QID) | RESPIRATORY_TRACT | Status: DC
  Administered 2023-07-24 (×2): 3 mL via RESPIRATORY_TRACT
  Filled 2023-07-24 (×2): qty 3

## 2023-07-24 MED ORDER — SODIUM CHLORIDE 0.9 % IV SOLN
1.0000 g | INTRAVENOUS | Status: DC
  Administered 2023-07-24: 1 g via INTRAVENOUS
  Filled 2023-07-24: qty 10

## 2023-07-24 MED ORDER — IPRATROPIUM-ALBUTEROL 0.5-2.5 (3) MG/3ML IN SOLN
3.0000 mL | Freq: Three times a day (TID) | RESPIRATORY_TRACT | Status: DC
  Administered 2023-07-25 (×2): 3 mL via RESPIRATORY_TRACT
  Filled 2023-07-24 (×2): qty 3

## 2023-07-24 MED ORDER — CIPROFLOXACIN HCL 500 MG PO TABS: 250.0000 mg | ORAL_TABLET | Freq: Two times a day (BID) | ORAL | Status: DC

## 2023-07-24 NOTE — Assessment & Plan Note (Signed)
 Patient admitted to the hospital for severe sepsis from a urinary source/pyelonephritis as evidenced by tachycardia, tachypnea, marked leukocytosis, lactic acidosis and pyuria. Chart review shows urine culture that yielded E. coli resistant to cephalosporins, so patient was initially started on meropenem .  CT renal stone study with concern of ascending UTI, also has an history of nonfunctional and atrophic right kidney. Lactic acidosis improving but pending for this morning. Urine cultures now growing Proteus mirabilis, only shows resistant to nitrofurantoin.  Blood cultures remain negative. -Switching meropenem  with Cipro  -Follow-up on cultures

## 2023-07-24 NOTE — Progress Notes (Signed)
 Progress Note   Patient: Brianna Briggs FMW:[id number] DOB: [birth year] DOA: 07/22/2023     2 DOS: the patient was seen and examined on 07/24/2023   Brief hospital course: Taken from H&P.  Brianna Briggs is a 73 y.o. female with medical history significant for COPD with chronic respiratory failure on 2 L of oxygen continuous, history of breast cancer status post right mastectomy, chronic diastolic dysfunction CHF, diverticulosis, depression, migraine headaches, solitary kidney who presents to the emergency room for evaluation of shortness of breath, which initially she had with exertion but now at rest as well as palpitations.  She had a recent exacerbation of her underlying COPD and was treated with systemic steroids, antibiotics and bronchodilator therapy.  She completed a 7-day course of steroids and antibiotics and initially had some relief but does not think that she is back to her baseline yet.  She was also complaining of dysuria and increased frequency, no hematuria.  Also has lower abdominal pain mostly on left and left lumbar area pain.  On presentation afebrile, had tachycardia and tachypnea.  Saturating well on 2 L of oxygen which is her baseline.  Later became febrile up to 101, labs with lactic acid of 2.4, UA with pyuria, leukocytosis 17.1, creatinine 1.10, sodium 133. Chest x-ray with hyperinflated lungs, no acute abnormality.  She was started on meropenem  for concern of UTI, had history of resistant E. coli.  Patient met sepsis criteria with fever, leukocytosis, tachycardia and tachypnea.  Severe sepsis with lactic acidosis.  7/7: Vitals stable except mild tachycardia at 101, slight worsening of leukocytosis at 18.8, creatinine at 1.22, BNP and lactic acid was ordered-pending Because of her flank pain-CT renal stone study was done which shows some perinephric and periureteric stranding on the left consistent with either ascending UTI or a recently passed calculus.  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 Also noted punctate cholelithiasis without any evidence of cholecystitis.  7/8: Blood pressure borderline soft this morning-ordered 1 more bolus.  Lactic acidosis resolved.  Urine cultures with Proteus Mirabilis, only shows resistant to nitrofurantoin.  Blood cultures remain negative-switching antibiotics to Cipro   Assessment and Plan: * Sepsis secondary to UTI [facility]) Patient admitted to the hospital for severe sepsis from a urinary source/pyelonephritis as evidenced by tachycardia, tachypnea, marked leukocytosis, lactic acidosis and pyuria. Chart review shows urine culture that yielded E. coli resistant to cephalosporins, so patient was initially started on meropenem .  CT renal stone study with concern of ascending UTI, also has an history of nonfunctional and atrophic right kidney. Lactic acidosis improving but pending for this morning. Urine cultures now growing Proteus mirabilis, only shows resistant to nitrofurantoin.  Blood cultures remain negative. -Switching meropenem  with Cipro  -Follow-up on cultures  Chronic obstructive pulmonary disease (COPD) (HCC) History of recent exacerbation and completed a tapering course of steroid and doxycycline.  No wheezing during current hospitalization and remained on baseline oxygen. - Continue with bronchodilator -Continue with supportive care  Chronic respiratory failure with hypoxia (HCC) Treatment is outlined in 2   Subjective: Patient was seen and examined today.  Dysuria and left flank pain improving.  Physical Exam: Vitals:   07/23/23 2353 07/24/23 0358 07/24/23 0757 07/24/23 1202  BP: (!) 116/51 (!) 104/59 (!) 86/58 96/72  Pulse: 97  93 85  Resp:   18 16  Temp: 98.5 F (36.9 C) 99.3 F (37.4 C) 98.5 F (36.9 C) 98.1 F (36.7 C)  TempSrc:      SpO2: 100% 99% 99% 100%  Weight:      Height:       General.  Frail elderly lady,  in no acute distress. Pulmonary.  Lungs clear bilaterally, normal respiratory effort. CV.  Regular rate and rhythm, no JVD, rub or murmur. Abdomen.  Soft, nontender, nondistended, BS positive. CNS.  Alert and oriented .  No focal neurologic deficit. Extremities.  No edema,  pulses intact and symmetrical. Psychiatry.  Judgment and insight appears normal.    Data Reviewed: Prior data reviewed  Family Communication: Discussed with patient  Disposition: Status is: Inpatient Remains inpatient appropriate because: Severity of illness  Planned Discharge Destination: Home  DVT prophylaxis.  Lovenox  Time spent: 50 minutes  This record has been created using Conservation officer, historic buildings. Errors have been sought and corrected,but may not always be located. Such creation errors do not reflect on the standard of care.   Author: Amaryllis Dare, MD 07/24/2023 2:26 PM  For on call review [email or web address].

## 2023-07-24 NOTE — Progress Notes (Signed)
 Mobility Specialist - Progress Note   Pre-mobility: HR (113) During mobility: SpO2(91) on 3L desat to 85% and pushed to 4L to recover (90) during amb Post-mobility: SPO2 on 3L(92)     07/24/23 1446  Mobility  Activity Ambulated with assistance in hallway;Dangled on edge of bed  Level of Assistance Modified independent, requires aide device or extra time  Assistive Device Other (Comment)  Distance Ambulated (ft) 320 ft  Range of Motion/Exercises Active  Activity Response Tolerated well  Mobility Referral Yes  Mobility visit 1 Mobility  Mobility Specialist Start Time (ACUTE ONLY) 1323  Mobility Specialist Stop Time (ACUTE ONLY) 1346  Mobility Specialist Time Calculation (min) (ACUTE ONLY) 23 min   Pt resting EOB upon entry on 3L. Pt STS and ambulates to hallway around NS ModI holding onto IV pole for 2 laps. Pt takes x1 standing rest break (HR 136) for 1 minute to recover SpO2 85% on 3L and pushed to 4L to recover (90-91). Pt returned to bed and left EOB with needs in reach. Pt put back on 3L (92%.)   Guido Rumble Mobility Specialist 07/24/23, 3:33 PM

## 2023-07-25 ENCOUNTER — Other Ambulatory Visit: Payer: Self-pay

## 2023-07-25 DIAGNOSIS — R6520 Severe sepsis without septic shock: Secondary | ICD-10-CM | POA: Diagnosis not present

## 2023-07-25 DIAGNOSIS — N390 Urinary tract infection, site not specified: Secondary | ICD-10-CM | POA: Insufficient documentation

## 2023-07-25 DIAGNOSIS — E663 Overweight: Secondary | ICD-10-CM | POA: Insufficient documentation

## 2023-07-25 DIAGNOSIS — J449 Chronic obstructive pulmonary disease, unspecified: Secondary | ICD-10-CM | POA: Diagnosis not present

## 2023-07-25 DIAGNOSIS — N182 Chronic kidney disease, stage 2 (mild): Secondary | ICD-10-CM | POA: Insufficient documentation

## 2023-07-25 DIAGNOSIS — A419 Sepsis, unspecified organism: Secondary | ICD-10-CM | POA: Diagnosis not present

## 2023-07-25 DIAGNOSIS — D696 Thrombocytopenia, unspecified: Secondary | ICD-10-CM | POA: Insufficient documentation

## 2023-07-25 LAB — BASIC METABOLIC PANEL WITH GFR
Anion gap: 9 (ref 5–15)
BUN: 11 mg/dL (ref 8–23)
CO2: 31 mmol/L (ref 22–32)
Calcium: 8.4 mg/dL — ABNORMAL LOW (ref 8.9–10.3)
Chloride: 99 mmol/L (ref 98–111)
Creatinine, Ser: 0.93 mg/dL (ref 0.44–1.00)
GFR, Estimated: >60 mL/min (ref >60)
Glucose, Bld: 181 mg/dL — ABNORMAL HIGH (ref 70–99)
Potassium: 3.2 mmol/L — ABNORMAL LOW (ref 3.5–5.1)
Sodium: 139 mmol/L (ref 135–145)

## 2023-07-25 LAB — CBC
HCT: 33.3 % — ABNORMAL LOW (ref 36.0–46.0)
Hemoglobin: 10.8 g/dL — ABNORMAL LOW (ref 12.0–15.0)
MCH: 29.2 pg (ref 26.0–34.0)
MCHC: 32.4 g/dL (ref 30.0–36.0)
MCV: 90 fL (ref 80.0–100.0)
Platelets: 126 K/uL — ABNORMAL LOW (ref 150–400)
RBC: 3.7 MIL/uL — ABNORMAL LOW (ref 3.87–5.11)
RDW: 14.6 % (ref 11.5–15.5)
WBC: 6.6 K/uL (ref 4.0–10.5)
nRBC: 0 % (ref 0.0–0.2)

## 2023-07-25 MED ORDER — POTASSIUM CHLORIDE CRYS ER 20 MEQ PO TBCR
40.0000 meq | EXTENDED_RELEASE_TABLET | ORAL | Status: AC
  Administered 2023-07-25 (×2): 40 meq via ORAL
  Filled 2023-07-25 (×2): qty 2

## 2023-07-25 MED ORDER — CEFADROXIL 500 MG PO CAPS
1000.0000 mg | ORAL_CAPSULE | Freq: Two times a day (BID) | ORAL | Status: DC
  Administered 2023-07-25: 1000 mg via ORAL
  Filled 2023-07-25: qty 2

## 2023-07-25 MED ORDER — PANCRELIPASE (LIP-PROT-AMYL) 24000-76000 UNITS PO CPEP
24000.0000 [IU] | ORAL_CAPSULE | Freq: Three times a day (TID) | ORAL | 0 refills | Status: DC
  Filled 2023-07-25: qty 100, 34d supply, fill #0

## 2023-07-25 MED ORDER — PANCRELIPASE (LIP-PROT-AMYL) 12000-38000 UNITS PO CPEP
24000.0000 [IU] | ORAL_CAPSULE | Freq: Three times a day (TID) | ORAL | Status: DC
  Administered 2023-07-25: 24000 [IU] via ORAL
  Filled 2023-07-25: qty 2

## 2023-07-25 MED ORDER — FUROSEMIDE 20 MG PO TABS
20.0000 mg | ORAL_TABLET | Freq: Every day | ORAL | 0 refills | Status: DC
  Filled 2023-07-25: qty 14, 14d supply, fill #0

## 2023-07-25 MED ORDER — POTASSIUM CHLORIDE CRYS ER 10 MEQ PO TBCR
20.0000 meq | EXTENDED_RELEASE_TABLET | Freq: Every day | ORAL | 0 refills | Status: DC
  Filled 2023-07-25: qty 28, 14d supply, fill #0

## 2023-07-25 MED ORDER — CEFADROXIL 500 MG PO CAPS
500.0000 mg | ORAL_CAPSULE | Freq: Two times a day (BID) | ORAL | Status: DC
  Filled 2023-07-25: qty 1

## 2023-07-25 MED ORDER — LOPERAMIDE HCL 2 MG PO TABS
2.0000 mg | ORAL_TABLET | Freq: Four times a day (QID) | ORAL | 0 refills | Status: DC | PRN
  Filled 2023-07-25: qty 12, 3d supply, fill #0

## 2023-07-25 MED ORDER — LACTATED RINGERS IV BOLUS: 750.0000 mL | Freq: Once | INTRAVENOUS | Status: DC

## 2023-07-25 MED ORDER — LACTATED RINGERS IV BOLUS
500.0000 mL | Freq: Once | INTRAVENOUS | Status: AC
  Administered 2023-07-25: 500 mL via INTRAVENOUS

## 2023-07-25 MED ORDER — FLUTICASONE FUROATE-VILANTEROL 100-25 MCG/ACT IN AEPB
1.0000 | INHALATION_SPRAY | Freq: Every day | RESPIRATORY_TRACT | 0 refills | Status: AC
  Filled 2023-07-25: qty 60, 30d supply, fill #0

## 2023-07-25 MED ORDER — CEFADROXIL 500 MG PO CAPS
1000.0000 mg | ORAL_CAPSULE | Freq: Two times a day (BID) | ORAL | 0 refills | Status: AC
  Filled 2023-07-25: qty 16, 4d supply, fill #0

## 2023-07-25 MED ORDER — LOPERAMIDE HCL 2 MG PO CAPS
2.0000 mg | ORAL_CAPSULE | Freq: Once | ORAL | Status: AC
  Administered 2023-07-25: 2 mg via ORAL
  Filled 2023-07-25: qty 1

## 2023-07-25 NOTE — Discharge Summary (Signed)
 Physician Discharge Summary   Patient: Brianna Briggs MRN: [id number] DOB: 1950/06/01  Admit date:     07/22/2023  Discharge date: 07/25/23  Discharge Physician: Murvin Mana   PCP: Lenon Layman ORN, MD   Recommendations at discharge:   Follow-up PCP in 1 week. Follow-up with cardiology in 1 week Check a CBC and BMP at next office visit  Discharge Diagnoses: Principal Problem:   Sepsis secondary to UTI [facility]) Active Problems:   Chronic obstructive pulmonary disease (COPD) (HCC)   Chronic respiratory failure with hypoxia (HCC)   Hypokalemia   Thrombocytopenia (HCC)   CKD (chronic kidney disease) stage 2, GFR 60-89 ml/min   Overweight (BMI 25.0-29.9)  Resolved Problems:   * No resolved hospital problems. *  Hospital Course: Brianna Briggs is a 73 y.o. female with medical history significant for COPD with chronic respiratory failure on 2 L of oxygen continuous, history of breast cancer status post right mastectomy, chronic diastolic dysfunction CHF, diverticulosis, depression, migraine headaches, solitary kidney who presents to the emergency room for evaluation of shortness of breath, which initially she had with exertion but now at rest as well as palpitations.  She had a recent exacerbation of her underlying COPD and was treated with systemic steroids, antibiotics and bronchodilator therapy.  She completed a 7-day course of steroids and antibiotics and initially had some relief but does not think that she is back to her baseline yet.  She was also complaining of dysuria and increased frequency, no hematuria.  Also has lower abdominal pain mostly on left and left lumbar area pain.  On presentation afebrile, had tachycardia and tachypnea.  Saturating well on 2 L of oxygen which is her baseline.  Later became febrile up to 101, labs with lactic acid of 2.4, UA with pyuria, leukocytosis 17.1, creatinine 1.10, sodium 133. Chest x-ray with hyperinflated lungs, no acute  abnormality.  She was started on meropenem  for concern of UTI, had history of resistant E. coli.  Patient met sepsis criteria with fever, leukocytosis, tachycardia and tachypnea.  Severe sepsis with lactic acidosis. Urine cultures with Proteus Mirabilis, only shows resistant to nitrofurantoin.  Patient initially treated with meropenem , switched to cefadroxil .  Assessment and Plan: *Severe sepsis secondary to UTI [facility]) Urinary tract infection secondary to Proteus Acute pyelonephritis. Patient admitted to the hospital for severe sepsis from a urinary source/pyelonephritis as evidenced by tachycardia, tachypnea, marked leukocytosis, lactic acidosis and pyuria. Chart review shows urine culture that yielded E. coli resistant to cephalosporins, so patient was initially started on meropenem .  CT renal stone study with concern of ascending UTI, also has an history of nonfunctional and atrophic right kidney. Urine cultures now growing Proteus mirabilis, only shows resistant to nitrofurantoin.  Blood cultures remain negative. Condition has improved, patient culture results, patient be switched to cefadroxil . Patient had some diarrhea after antibiotics, leukocytosis resolved.  Does not have suspicion for C. difficile.  Prescribed as needed Imodium .  Chronic obstructive pulmonary disease (COPD) (HCC) Chronic respiratory failure with hypoxia (HCC) Chronic diastolic congestive heart failure Patient has a progressively worsening shortness of breath presumably from COPD.  Recently had a COPD exacerbation and completed steroid taper.  At this point, I will maximize treatment for COPD. Patient has mild elevation of BNP at time of admission, at that time, patient renal function was also getting worse.  Grossly, patient does not seem to be overtly volume overloaded.  Reviewed echocardiogram performed 2023, ejection fraction 60 to 65%, not able to determine pulmonary arterial pressure.  It is likely, patient will have  some pulmonary hypertension.  Also likely that patient had may have some mild volume overload from chronic diastolic congestive heart failure.  As a result, I will try to treat her with 20 mg oral Lasix  daily.  She can follow-up with PCP and cardiology to determine if that has improved her shortness of breath.  Chronic kidney disease stage II. Hyponatremia resolved. Hypokalemia. Patient had mild elevation in troponin, does not meet criteria for AKI.  Baseline creatinine still remains above 60.  Has CKD stage II.  She also had mild hypokalemia, repleted with oral potassium.  Will also continue 20 mEq of KCl daily as patient is already on 20 mg of furosemide .  Thrombocytopenia. Mild thrombocytopenia recorded in 2023.  Follow-up with PCP as outpatient repeat CBC         Consultants: None Procedures performed: None  Disposition: Home Diet recommendation:  Discharge Diet Orders (From admission, onward)     Start     Ordered   07/25/23 0000  Diet - low sodium heart healthy        07/25/23 1145           Cardiac diet DISCHARGE MEDICATION: Allergies as of 07/25/2023       Reactions   Epinephrine  Shortness Of Breath   Amoxicillin -pot Clavulanate Diarrhea, Rash   TOLERATED CEFAZOLIN  Other reaction(s): Unknown   Biaxin [clarithromycin] Diarrhea   Other reaction(s): Unknown   Oxycodone  Itching   Codeine Rash   Nitrofurantoin Rash        Medication List     STOP taking these medications    doxycycline 100 MG tablet Commonly known as: VIBRA-TABS   predniSONE  10 MG tablet Commonly known as: DELTASONE        TAKE these medications    Anti-Diarrheal 2 MG tablet Generic drug: loperamide  Take 1 tablet (2 mg total) by mouth 4 (four) times daily as needed for diarrhea or loose stools.   cefadroxil  500 MG capsule Commonly known as: DURICEF Take 2 capsules (1,000 mg total) by mouth 2 (two) times daily for 4 days.   chlorpheniramine-HYDROcodone  10-8 MG/5ML Commonly  known as: TUSSIONEX Take 5 mLs by mouth every 12 (twelve) hours as needed for cough.   fluticasone  furoate-vilanterol 100-25 MCG/ACT Aepb Commonly known as: BREO ELLIPTA  Inhale 1 puff into the lungs daily. Start taking on: July 26, 2023   furosemide  20 MG tablet Commonly known as: Lasix  Take 1 tablet (20 mg total) by mouth daily.   ipratropium-albuterol  0.5-2.5 (3) MG/3ML Soln Commonly known as: DUONEB Inhale 3 mLs into the lungs every 4 (four) hours as needed (Wheezing).   ketoconazole  2 % cream Commonly known as: NIZORAL  Apply to aa's rash under breast BID PRN. What changed: Another medication with the same name was removed. Continue taking this medication, and follow the directions you see here.   mometasone  0.1 % cream Commonly known as: ELOCON  APPLY ONCE DAILY AS DIRECTED UP TO 5 DAYS PER WEEK AS NEEDED   mupirocin  ointment 2 % Commonly known as: BACTROBAN  Apply 1 Application topically daily.   OXYGEN Inhale 2 L into the lungs at bedtime.   pantoprazole  20 MG tablet Commonly known as: PROTONIX  Take 20 mg by mouth daily.   potassium chloride  10 MEQ tablet Commonly known as: KLOR-CON  M Take 2 tablets (20 mEq total) by mouth daily for 14 days.   Ventolin  HFA 108 (90 Base) MCG/ACT inhaler Generic drug: albuterol  Inhale 2 puffs into the lungs every evening.  Follow-up Information     Lenon Layman ORN, MD Follow up in 1 week(s).   Specialty: Internal Medicine Contact information: [address] [facility] [place] [address] [phone]         Dewane Shiner, DO Follow up in 1 week(s).   Specialty: Cardiology Contact information: [address] [place] [address] [phone]                Discharge Exam: Fredricka Weights   07/22/23 1351  Weight: 68.5 kg   General exam: Appears calm and comfortable  Respiratory system: Decreased breath sounds without wheezes or crackles. Respiratory effort  normal. Cardiovascular system: S1 & S2 heard, RRR. No JVD, murmurs, rubs, gallops or clicks. No pedal edema. Gastrointestinal system: Abdomen is nondistended, soft and nontender. No organomegaly or masses felt. Normal bowel sounds heard. Central nervous system: Alert and oriented. No focal neurological deficits. Extremities: Symmetric 5 x 5 power. Skin: No rashes, lesions or ulcers Psychiatry: Judgement and insight appear normal. Mood & affect appropriate.    Condition at discharge: good  The results of significant diagnostics from this hospitalization (including imaging, microbiology, ancillary and laboratory) are listed below for reference.   Imaging Studies: CT RENAL STONE STUDY Result Date: 07/23/2023 CLINICAL DATA:  Abdominal/flank pain, stone suspected EXAM: CT ABDOMEN AND PELVIS WITHOUT CONTRAST TECHNIQUE: Multidetector CT imaging of the abdomen and pelvis was performed following the standard protocol without IV contrast. RADIATION DOSE REDUCTION: This exam was performed according to the departmental dose-optimization program which includes automated exposure control, adjustment of the mA and/or kV according to patient size and/or use of iterative reconstruction technique. COMPARISON:  None Available. FINDINGS: Of note, the lack of intravenous contrast limits evaluation of the solid organ parenchyma and vascularity. Lower chest: No focal airspace consolidation or pleural effusion. Subsegmental atelectasis in the right middle lobe and lingula. Small pericardial effusion. Hepatobiliary: No mass.Diffuse hepatic steatosis.Punctate radiopaque stones. No wall thickening of the gallbladder. No intrahepatic or extrahepatic biliary ductal dilation. Pancreas: No mass or main ductal dilation. No peripancreatic inflammation or fluid collection. Spleen: Normal size. No mass. Adrenals/Urinary Tract: Unchanged small left adrenal myelolipoma. Redemonstrated severe atrophy and chronic hydronephrosis of the right  renal collecting system. Cortical scarring and renal cyst in the left kidney, unchanged. Perinephric and periureteric stranding on the left. Partially distended urinary bladder without visualized abnormality. Stomach/Bowel: The stomach is decompressed without focal abnormality. No small bowel wall thickening or inflammation. No small bowel obstruction.Small bowel anastomosis in the right lower quadrant status post ostomy reversal.Normal appendix. Sigmoid anastomosis. Vascular/Lymphatic: No aortic aneurysm. Diffuse aortoiliac atherosclerosis. No intraabdominal or pelvic lymphadenopathy. Reproductive: Hysterectomy. No concerning adnexal mass. No free pelvic fluid. Other: No pneumoperitoneum or ascites. Musculoskeletal: No acute fracture or destructive lesion. Diffuse osteopenia. Multilevel degenerative disc disease of the spine. Mild grade 1 anterolisthesis of L4 on L5. IMPRESSION: 1. Perinephric and periureteric stranding on the left, which may be due to a recently passed calculus or an ascending urinary tract infection. Correlation with urinalysis recommended. 2. Punctate cholecystolithiasis.  No changes of acute cholecystitis. 3. Unchanged left adrenal myelolipoma. Aortic Atherosclerosis (ICD10-I70.0). Electronically Signed   By: Rogelia Myers M.D.   On: 07/23/2023 10:15   DG Chest 2 View Result Date: 07/22/2023 CLINICAL DATA:  Short of breath.  Chest pain EXAM: CHEST - 2 VIEW COMPARISON:  None Available. FINDINGS: Normal mediastinum and cardiac silhouette. Lungs are hyperinflated. Normal pulmonary vasculature. No evidence of effusion, infiltrate, or pneumothorax. No acute bony  abnormality. IMPRESSION: Hyperinflation. No acute cardiopulmonary process. Electronically Signed   By: Jackquline Boxer M.D.   On: 07/22/2023 14:35    Microbiology: Results for orders placed or performed during the hospital encounter of 07/22/23  Blood culture (routine x 2)     Status: None (Preliminary result)   Collection Time:  07/22/23  3:00 PM   Specimen: BLOOD  Result Value Ref Range Status   Specimen Description BLOOD BLOOD LEFT WRIST  Final   Special Requests   Final    BOTTLES DRAWN AEROBIC AND ANAEROBIC Blood Culture adequate volume   Culture   Final    NO GROWTH 3 DAYS Performed at [facility], [address], [place], [place] [address]    Report Status PENDING  Incomplete  Blood culture (routine x 2)     Status: None (Preliminary result)   Collection Time: 07/22/23  3:15 PM   Specimen: BLOOD  Result Value Ref Range Status   Specimen Description BLOOD BLOOD LEFT HAND  Final   Special Requests   Final    BOTTLES DRAWN AEROBIC AND ANAEROBIC Blood Culture adequate volume   Culture   Final    NO GROWTH 3 DAYS Performed at [facility], [address], [place], [place] [address]    Report Status PENDING  Incomplete  Urine Culture (for pregnant, neutropenic or urologic patients or patients with an indwelling urinary catheter)     Status: Abnormal   Collection Time: 07/22/23  3:15 PM   Specimen: Urine, Clean Catch  Result Value Ref Range Status   Specimen Description   Final    URINE, CLEAN CATCH Performed at [facility], [address] [place], [place] [address]    Special Requests   Final    NONE Performed at [facility], [address] [place], [place] [address]    Culture >=100,000 COLONIES/mL PROTEUS MIRABILIS (A)  Final   Report Status 07/24/2023 FINAL  Final   Organism ID, Bacteria PROTEUS MIRABILIS (A)  Final      Susceptibility   Proteus mirabilis - MIC*    AMPICILLIN <=2 SENSITIVE Sensitive     CEFAZOLIN  <=4 SENSITIVE Sensitive     CEFEPIME  <=0.12 SENSITIVE Sensitive     CEFTRIAXONE  <=0.25 SENSITIVE Sensitive     CIPROFLOXACIN  <=0.25 SENSITIVE Sensitive     GENTAMICIN <=1 SENSITIVE Sensitive     IMIPENEM 2 SENSITIVE Sensitive     NITROFURANTOIN 128 RESISTANT Resistant     TRIMETH/SULFA <=20 SENSITIVE Sensitive     AMPICILLIN/SULBACTAM  <=2 SENSITIVE Sensitive     PIP/TAZO <=4 SENSITIVE Sensitive ug/mL    * >=100,000 COLONIES/mL PROTEUS MIRABILIS    Labs: CBC: Recent Labs  Lab 07/22/23 1402 07/23/23 0536 07/25/23 1053  WBC 17.1* 18.8* 6.6  HGB 15.1* 12.6 10.8*  HCT 45.7 39.2 33.3*  MCV 87.5 91.0 90.0  PLT 208 147* 126*   Basic Metabolic Panel: Recent Labs  Lab 07/22/23 1402 07/23/23 0536 07/25/23 1053  NA 133* 137 139  K 4.0 4.1 3.2*  CL 96* 98 99  CO2 26 28 31   GLUCOSE 144* 112* 181*  BUN 16 14 11   CREATININE 1.10* 1.22* 0.93  CALCIUM  9.1 8.3* 8.4*   Liver Function Tests: No results for input(s): AST, ALT, ALKPHOS, BILITOT, PROT, ALBUMIN  in the last 168 hours. CBG: No results for input(s): GLUCAP in the last 168 hours.  Discharge time spent: greater than 30 minutes.  Signed: Murvin Mana, MD [facility] 07/25/2023

## 2023-07-25 NOTE — TOC Transition Note (Signed)
 Transition of Care [facility]) - Discharge Note   Patient Details  Name: Brianna Briggs MRN: [id number] Date of Birth: 1950-05-29  [facility]) CM/SW Contact:  Elouise LULLA Capri, RN 07/25/2023, 2:20 PM   Clinical Narrative:    Discharge orders noted for home/self care. CM call to patient's room regarding discharge orders. CM spoke to patient's husband, Abran. Per patient's husband, no voiced concerns. No identified needs.    Final next level of care: Home/Self Care Barriers to Discharge: No Barriers Identified   Patient Goals and CMS Choice    Home/self care  Discharge Placement      Home/self care          Discharge Plan and Services Additional resources added to the After Visit Summary for     Social Drivers of Health (SDOH) Interventions SDOH Screenings   Food Insecurity: No Food Insecurity (07/23/2023)  Housing: Low Risk  (07/23/2023)  Transportation Needs: No Transportation Needs (07/23/2023)  Utilities: Not At Risk (07/23/2023)  Depression (PHQ2-9): High Risk (03/05/2023)  Financial Resource Strain: Low Risk  (12/06/2022)   Received from [facility]  Physical Activity: Inactive (07/29/2019)   Received from [facility]  Social Connections: Moderately Isolated (07/23/2023)  Stress: No Stress Concern Present (07/29/2019)   Received from [facility]  Tobacco Use: Medium Risk (07/22/2023)   Received from [facility]     Readmission Risk Interventions    07/28/2021    4:14 PM  Readmission Risk Prevention Plan  Transportation Screening Complete  PCP or Specialist Appt within 5-7 Days Complete  Home Care Screening Complete  Medication Review (RN CM) Complete

## 2023-07-27 LAB — CULTURE, BLOOD (ROUTINE X 2)
Culture: NO GROWTH
Culture: NO GROWTH
Special Requests: ADEQUATE
Special Requests: ADEQUATE

## 2023-09-03 ENCOUNTER — Other Ambulatory Visit: Payer: Self-pay | Admitting: Dermatology

## 2023-09-25 ENCOUNTER — Encounter: Payer: Self-pay | Admitting: Dermatology

## 2023-09-25 ENCOUNTER — Ambulatory Visit (INDEPENDENT_AMBULATORY_CARE_PROVIDER_SITE_OTHER): Payer: Medicare Other | Admitting: Dermatology

## 2023-09-25 DIAGNOSIS — L304 Erythema intertrigo: Secondary | ICD-10-CM

## 2023-09-25 DIAGNOSIS — L820 Inflamed seborrheic keratosis: Secondary | ICD-10-CM | POA: Diagnosis not present

## 2023-09-25 DIAGNOSIS — Z79899 Other long term (current) drug therapy: Secondary | ICD-10-CM

## 2023-09-25 DIAGNOSIS — W908XXA Exposure to other nonionizing radiation, initial encounter: Secondary | ICD-10-CM | POA: Diagnosis not present

## 2023-09-25 DIAGNOSIS — Z7189 Other specified counseling: Secondary | ICD-10-CM

## 2023-09-25 DIAGNOSIS — L209 Atopic dermatitis, unspecified: Secondary | ICD-10-CM | POA: Diagnosis not present

## 2023-09-25 DIAGNOSIS — D224 Melanocytic nevi of scalp and neck: Secondary | ICD-10-CM | POA: Diagnosis not present

## 2023-09-25 DIAGNOSIS — L578 Other skin changes due to chronic exposure to nonionizing radiation: Secondary | ICD-10-CM

## 2023-09-25 DIAGNOSIS — L570 Actinic keratosis: Secondary | ICD-10-CM | POA: Diagnosis not present

## 2023-09-25 DIAGNOSIS — L309 Dermatitis, unspecified: Secondary | ICD-10-CM

## 2023-09-25 DIAGNOSIS — L2089 Other atopic dermatitis: Secondary | ICD-10-CM

## 2023-09-25 MED ORDER — TACROLIMUS 0.1 % EX OINT: TOPICAL_OINTMENT | Freq: Two times a day (BID) | CUTANEOUS | 3 refills | Status: AC

## 2023-09-25 MED ORDER — MOMETASONE FUROATE 0.1 % EX CREA: TOPICAL_CREAM | CUTANEOUS | 3 refills | Status: AC

## 2023-09-25 MED ORDER — NYSTATIN 100000 UNIT/GM EX POWD: 1.0000 | Freq: Three times a day (TID) | CUTANEOUS | 3 refills | Status: AC

## 2023-09-25 NOTE — Progress Notes (Signed)
 Follow-Up Visit   Subjective  Brianna Briggs is a 73 y.o. female who presents for the following: Atopic Dermatitis  Last visit on 03/14/23.Was instructed to continue Tacrolimus  0.1% and Mometasone  as needed for flares. She is still not ready to start Dupixent. Would like to continue topical treatment but is not currently using her topicals.  The patient has spots, moles and lesions to be evaluated, some may be new or changing and the patient may have concern these could be cancer.  The following portions of the chart were reviewed this encounter and updated as appropriate: medications, allergies, medical history  Review of Systems:  No other skin or systemic complaints except as noted in HPI or Assessment and Plan.  Objective  Well appearing patient in no apparent distress; mood and affect are within normal limits.  Relevant physical exam findings are noted in the Assessment and Plan.  Left Forehead, Left Parietal Scalp, Right Elbow - Posterior, Right Forehead-hairline, Scalp Stuck-on, waxy, tan-brown papule or plaque --Discussed benign etiology and prognosis.  Right Parotid Area Erythematous thin papules/macules with gritty scale.   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  She has declined Dupixent treatment in past. She declines Dupixent today, but She will discuss with her Pumonologist at her next visit with Dr A. Exam: Scaly pink papules coalescing to plaques on elbows with fissures on elbows, scaly patches on upper thighs and ears: See attached flared Atopic dermatitis (eczema) is a chronic, relapsing, pruritic condition that can significantly affect quality of life. It is often associated with allergic rhinitis and/or asthma and can require treatment with topical  medications, phototherapy, or in severe cases biologic injectable medication (Dupixent; Adbry) or Oral JAK inhibitors. Treatment Plan: Restart tacrolimus  0.1% and mometasone  for flares For itching in the ears, alternate tacrolimus  and mometasone  daily Reviewed Dupixent as a good alternative to atopic derm and COPD  ACTINIC DAMAGE - chronic, secondary to cumulative UV radiation exposure/sun exposure over time - diffuse scaly erythematous macules with underlying dyspigmentation - Recommend daily broad spectrum sunscreen SPF 30+ to sun-exposed areas, reapply every 2 hours as needed.  - Recommend staying in the shade or wearing long sleeves, sun glasses (UVA+UVB protection) and wide brim hats (4-inch brim around the entire circumference of the hat). - Call for new or changing lesions.  INFLAMED SEBORRHEIC KERATOSIS (5) Left Forehead, Left Parietal Scalp, Right Elbow - Posterior, Right Forehead-hairline, Scalp Destruction of lesion - Left Forehead, Left Parietal Scalp, Right Elbow - Posterior, Right Forehead-hairline, Scalp Complexity: simple   Destruction method: cryotherapy   Informed consent: discussed and consent obtained   Timeout:  patient name, date of birth, surgical site, and procedure verified Lesion destroyed using liquid nitrogen: Yes   Region frozen until ice ball extended beyond lesion: Yes   Outcome: patient tolerated procedure well with no complications   Post-procedure details: wound care instructions given    AK (ACTINIC KERATOSIS) Right Parotid Area Destruction of lesion - Right Parotid Area Complexity: simple   Destruction method: cryotherapy   Informed consent: discussed and consent obtained   Timeout:  patient name, date of birth, surgical site, and procedure verified Lesion destroyed using liquid nitrogen: Yes   Region frozen until ice ball extended beyond lesion: Yes   Outcome: patient tolerated procedure well with  no complications   Post-procedure details: wound  care instructions given    HAND DERMATITIS   Related Medications mometasone  (ELOCON ) 0.1 % cream APPLY ONCE DAILY AS DIRECTED UP TO 5 DAYS PER WEEK AS NEEDED ACTINIC SKIN DAMAGE   OTHER ATOPIC DERMATITIS   INTERTRIGO     MELANOCYTIC NEVI Exam: flesh-colored on R occipital scalp Treatment Plan: Benign appearing on exam today. Recommend observation. Call clinic for new or changing moles. Recommend daily use of broad spectrum spf 30+ sunscreen to sun-exposed areas.    INTERTRIGO Inframammary area Left Exam: Erythematous macerated patches in body folds Chronic and persistent condition with duration or expected duration over one year. Condition is bothersome/symptomatic for patient. Currently flared. Intertrigo is a chronic recurrent rash that occurs in skin fold areas that may be associated with friction; heat; moisture; yeast; fungus; and bacteria.  It is exacerbated by increased movement / activity; sweating; and higher atmospheric temperature.  Use of an absorbant powder such as Zeasorb AF powder or other OTC antifungal powder to the area daily can prevent rash recurrence. Other options to help keep the area dry include blow drying the area after bathing or using antiperspirant products such as Duradry sweat minimizing gel. Treatment Plan: Nystatin  powder to use as needed for flares    Return in about 6 months (around 03/24/2024) for atopic derm.  I, Gordan Beams, CMA, am acting as scribe for Alm Rhyme, MD.   Documentation: I have reviewed the above documentation for accuracy and completeness, and I agree with the above.  Alm Rhyme, MD

## 2023-11-14 ENCOUNTER — Ambulatory Visit (INDEPENDENT_AMBULATORY_CARE_PROVIDER_SITE_OTHER): Admitting: Urology

## 2023-11-14 ENCOUNTER — Encounter: Payer: Self-pay | Admitting: Urology

## 2023-11-14 VITALS — BP 153/96 | HR 110 | Wt 158.1 lb

## 2023-11-14 DIAGNOSIS — R350 Frequency of micturition: Secondary | ICD-10-CM | POA: Diagnosis not present

## 2023-11-14 DIAGNOSIS — N390 Urinary tract infection, site not specified: Secondary | ICD-10-CM | POA: Diagnosis not present

## 2023-11-14 DIAGNOSIS — R399 Unspecified symptoms and signs involving the genitourinary system: Secondary | ICD-10-CM

## 2023-11-14 LAB — URINALYSIS, COMPLETE
Bilirubin, UA: NEGATIVE
Glucose, UA: NEGATIVE
Ketones, UA: NEGATIVE
Nitrite, UA: POSITIVE — AB
Protein,UA: NEGATIVE
RBC, UA: NEGATIVE
Specific Gravity, UA: 1.02 (ref 1.005–1.030)
Urobilinogen, Ur: 0.2 mg/dL (ref 0.2–1.0)
pH, UA: 6 (ref 5.0–7.5)

## 2023-11-14 LAB — MICROSCOPIC EXAMINATION: WBC, UA: >30 /HPF — AB (ref 0–5)

## 2023-11-14 LAB — BLADDER SCAN AMB NON-IMAGING: Scan Result: 10

## 2023-11-14 MED ORDER — SULFAMETHOXAZOLE-TRIMETHOPRIM 800-160 MG PO TABS: 1.0000 | ORAL_TABLET | Freq: Two times a day (BID) | ORAL | 0 refills | Status: AC

## 2023-11-14 NOTE — Progress Notes (Signed)
 11/14/2023 1:35 PM   Brianna Briggs [birth year] [id number]  Referring provider: Lenon Layman ORN, MD [address] [facility] [place],  [place] [address]  Chief Complaint  Patient presents with   Urinary Frequency    HPI: Brianna Briggs is a 73 y.o. female presents for evaluation of lower urinary tract symptoms.  Hospitalized at [facility] July 2025 for sepsis from urinary source CT showed a stable atrophic/hydronephrotic right kidney and left kidney with cortical scarring Since that hospitalization she has had intermittent symptoms of urinary hesitancy and a weak urinary stream.  She does have prolapse and thinks it is most likely related.  She wanted to make sure she is adequately emptying her bladder Has had mild dysuria 2 weeks ago which resolved however had onset 2 days ago No fever, chills, gross hematuria Saw Dr. Chauncey 2017 for recurrent UTI Was treated with a 3-day course of Septra DS in August 2025 for an E. coli UTI   PMH: Past Medical History:  Diagnosis Date   Acute on chronic respiratory failure with hypoxia (HCC)    Allergic genetic state    Breast cancer, right (HCC) 1996   a.) s/p total mastectomy   Bronchitis    Chronic eczema    COPD (chronic obstructive pulmonary disease) (HCC)    Coronary artery calcification seen on CT scan    Dependence on nocturnal oxygen therapy    a.) 2 L/[place]   Diastolic dysfunction    a.) TTE 02/28/2021: EF 50%, triv MR/TR, G1DD; b.) TEE 07/16/2021: EF 55-60%, G1DD.   Diverticulitis of large intestine with abscess without bleeding    DOE (dyspnea on exertion)    History of 2019 novel coronavirus disease (COVID-19) 08/2021   History of chicken pox    History of colon polyps    Hyperlipidemia    Hypophosphatemia    Hypotension    MDD (major depressive disorder)    Migraines    Perforated diverticulum of intestine    Peripheral vascular disease with claudication    PSVT (paroxysmal supraventricular  tachycardia)    PVC (premature ventricular contraction)    Severe sepsis with lactic acidosis (HCC)    Single kidney    Squamous cell carcinoma of skin 01/27/2019   Right inferior lateral thigh. KA-type   Thoracic aortic atherosclerosis    Tobacco use     Surgical History: Past Surgical History:  Procedure Laterality Date   ABDOMINAL HYSTERECTOMY     COLON SURGERY     COLONOSCOPY N/A 05/22/2022   Procedure: COLONOSCOPY;  Surgeon: Onita Elspeth Sharper, DO;  [facility] ENDOSCOPY;  Service: Gastroenterology;  Laterality: N/A;   COLONOSCOPY WITH PROPOFOL  N/A 05/01/2019   Procedure: COLONOSCOPY WITH PROPOFOL ;  Surgeon: Toledo, Ladell POUR, MD;  Location: [facility] ENDOSCOPY;  Service: Gastroenterology;  Laterality: N/A;   ESOPHAGOGASTRODUODENOSCOPY N/A 05/22/2022   Procedure: ESOPHAGOGASTRODUODENOSCOPY (EGD);  Surgeon: Onita Elspeth Sharper, DO;  [facility] END[facility]COPY;  Service: Gastroenterology;  Laterality: N/A;   ESOPHAGOGASTRODUODENOSCOPY (EGD) WITH PROPOFOL  N/A 05/01/2019   Procedure: ESOPHAGOGASTRODUODENOSCOPY (EGD) WITH PROPOFOL ;  Surgeon: Toledo, Ladell POUR, MD;  Location: [facility] ENDOSCOPY;  Service: Gastroenterology;  Laterality: N/A;   HERNIA REPAIR     ILEOSTOMY CLOSURE N/A 12/12/2021   Procedure: ILEOSTOMY TAKEDOWN;  Surgeon: Rodolph Romano, MD;  Location: [facility] ORS;  Service: General;  Laterality: N/A;   IR CATHETER TUBE CHANGE  06/29/2021   IR RADIOLOGIST EVAL & MGMT  06/23/2021   IR RADIOLOGIST EVAL & MGMT  07/14/2021  LAPAROTOMY N/A 07/31/2021   Procedure: EXPLORATORY LAPAROTOMY;  Surgeon: Rodolph Romano, MD;  Location: [facility] ORS;  Service: General;  Laterality: N/A;   MASTECTOMY Right    PARASTOMAL HERNIA REPAIR N/A 12/12/2021   Procedure: HERNIA REPAIR PARASTOMAL;  Surgeon: Rodolph Romano, MD;  Location: [facility] ORS;  Service: General;  Laterality: N/A;   PARTIAL COLECTOMY N/A 07/31/2021   Procedure: GREGORY ILEOSTOMY;  Surgeon: Rodolph Romano, MD;   Location: [facility] ORS;  Service: General;  Laterality: N/A;   SHOULDER ARTHROSCOPY WITH SUBACROMIAL DECOMPRESSION, ROTATOR CUFF REPAIR AND BICEP TENDON REPAIR Left 01/13/2008   TONSILLECTOMY     TUBAL LIGATION      Home Medications:  Allergies as of 11/14/2023       Reactions   Epinephrine  Shortness Of Breath   Amoxicillin -pot Clavulanate Diarrhea, Rash   TOLERATED CEFAZOLIN  Other reaction(s): Unknown   Biaxin [clarithromycin] Diarrhea   Other reaction(s): Unknown   Oxycodone  Itching   Codeine Rash   Nitrofurantoin Rash        Medication List        Accurate as of November 14, 2023  1:35 PM. If you have any questions, ask your nurse or doctor.          STOP taking these medications    Anti-Diarrheal 2 MG tablet Generic drug: loperamide  Stopped by: Glendia JAYSON Barba   chlorpheniramine-HYDROcodone  10-8 MG/5ML Commonly known as: TUSSIONEX Stopped by: Glendia JAYSON Barba   furosemide  20 MG tablet Commonly known as: Lasix  Stopped by: Glendia JAYSON Barba   potassium chloride  10 MEQ tablet Commonly known as: KLOR-CON  M Stopped by: Glendia JAYSON Barba       TAKE these medications    diclofenac Sodium 1 % Gel Commonly known as: VOLTAREN Apply 2 g topically 4 (four) times daily.   fluticasone  furoate-vilanterol 100-25 MCG/ACT Aepb Commonly known as: BREO ELLIPTA  Inhale 1 puff into the lungs daily.   ipratropium-albuterol  0.5-2.5 (3) MG/3ML Soln Commonly known as: DUONEB Inhale 3 mLs into the lungs every 4 (four) hours as needed (Wheezing).   ketoconazole  2 % cream Commonly known as: NIZORAL  Apply to aa's rash under breast BID PRN.   meloxicam 7.5 MG tablet Commonly known as: MOBIC Take 7.5 mg by mouth daily.   mometasone  0.1 % cream Commonly known as: ELOCON  APPLY ONCE DAILY AS DIRECTED UP TO 5 DAYS PER WEEK AS NEEDED   mupirocin  ointment 2 % Commonly known as: BACTROBAN  APPLY 1 APPLICATION TOPICALLY DAILY   nystatin  powder Commonly known as:  MYCOSTATIN /NYSTOP  Apply 1 Application topically 3 (three) times daily.   OXYGEN Inhale 2 L into the lungs at bedtime.   pantoprazole  20 MG tablet Commonly known as: PROTONIX  Take 20 mg by mouth daily.   tacrolimus  0.1 % ointment Commonly known as: PROTOPIC  Apply topically 2 (two) times daily.   Ventolin  HFA 108 (90 Base) MCG/ACT inhaler Generic drug: albuterol  Inhale 2 puffs into the lungs every evening.        Allergies:  Allergies  Allergen Reactions   Epinephrine  Shortness Of Breath   Amoxicillin -Pot Clavulanate Diarrhea and Rash    TOLERATED CEFAZOLIN  Other reaction(s): Unknown   Biaxin [Clarithromycin] Diarrhea    Other reaction(s): Unknown   Oxycodone  Itching   Codeine Rash   Nitrofurantoin Rash    Family History: Family History  Problem Relation Age of Onset   Cancer Father    Kidney failure Mother     Social History:  reports that she quit smoking about 2 years ago. Her smoking use  included cigarettes. She started smoking about 42 years ago. She has a 60 pack-year smoking history. She has never used smokeless tobacco. She reports that she does not drink alcohol and does not use drugs.   Physical Exam: BP (!) 153/96 (BP Location: Left Arm, Patient Position: Sitting, Cuff Size: Normal)   Pulse (!) 110   Wt 158 lb 1.6 oz (71.7 kg)   SpO2 94%   BMI 28.92 kg/m   Constitutional:  Alert, No acute distress. HEENT: [place] AT Respiratory: Normal respiratory effort, no increased work of breathing. Psychiatric: Normal mood and affect.  Laboratory Data:  Urinalysis Dipstick 2+ leukocyte/nitrite positive Microscopy >30 WBC   Pertinent Imaging: CT images were personally reviewed and interpreted  CT RENAL STONE STUDY  Narrative CLINICAL DATA:  Abdominal/flank pain, stone suspected  EXAM: CT ABDOMEN AND PELVIS WITHOUT CONTRAST  TECHNIQUE: Multidetector CT imaging of the abdomen and pelvis was performed following the standard protocol without IV  contrast.  RADIATION DOSE REDUCTION: This exam was performed according to the departmental dose-optimization program which includes automated exposure control, adjustment of the mA and/or kV according to patient size and/or use of iterative reconstruction technique.  COMPARISON:  None Available.  FINDINGS: Of note, the lack of intravenous contrast limits evaluation of the solid organ parenchyma and vascularity.  Lower chest: No focal airspace consolidation or pleural effusion. Subsegmental atelectasis in the right middle lobe and lingula. Small pericardial effusion.  Hepatobiliary: No mass.Diffuse hepatic steatosis.Punctate radiopaque stones. No wall thickening of the gallbladder. No intrahepatic or extrahepatic biliary ductal dilation.  Pancreas: No mass or main ductal dilation. No peripancreatic inflammation or fluid collection.  Spleen: Normal size. No mass.  Adrenals/Urinary Tract: Unchanged small left adrenal myelolipoma. Redemonstrated severe atrophy and chronic hydronephrosis of the right renal collecting system. Cortical scarring and renal cyst in the left kidney, unchanged. Perinephric and periureteric stranding on the left. Partially distended urinary bladder without visualized abnormality.  Stomach/Bowel: The stomach is decompressed without focal abnormality. No small bowel wall thickening or inflammation. No small bowel obstruction.Small bowel anastomosis in the right lower quadrant status post ostomy reversal.Normal appendix. Sigmoid anastomosis.  Vascular/Lymphatic: No aortic aneurysm. Diffuse aortoiliac atherosclerosis. No intraabdominal or pelvic lymphadenopathy.  Reproductive: Hysterectomy. No concerning adnexal mass. No free pelvic fluid.  Other: No pneumoperitoneum or ascites.  Musculoskeletal: No acute fracture or destructive lesion. Diffuse osteopenia. Multilevel degenerative disc disease of the spine. Mild grade 1 anterolisthesis of L4 on  L5.  IMPRESSION: 1. Perinephric and periureteric stranding on the left, which may be due to a recently passed calculus or an ascending urinary tract infection. Correlation with urinalysis recommended. 2. Punctate cholecystolithiasis.  No changes of acute cholecystitis. 3. Unchanged left adrenal myelolipoma.  Aortic Atherosclerosis (ICD10-I70.0).   Electronically Signed By: Rogelia Myers M.D. On: 07/23/2023 10:15   Assessment & Plan:    1.  Recurrent UTI Mild dysuria UA consistent with infection Urine culture ordered Rx Septra DS sent   2.  Lower urinary tract symptoms PVR today 10 mL Offered cystoscopy for further evaluation of her LUTS however she declined at this point since it occurs intermittently and she believes that is related to prolapse  Glendia JAYSON Barba, MD  [facility] [address] [place], [place] [address] [phone]

## 2023-11-18 ENCOUNTER — Ambulatory Visit: Payer: Self-pay | Admitting: Urology

## 2023-11-18 LAB — CULTURE, URINE COMPREHENSIVE

## 2024-02-14 ENCOUNTER — Encounter: Payer: Self-pay | Admitting: Acute Care

## 2024-03-25 ENCOUNTER — Ambulatory Visit: Admitting: Dermatology

## 2024-05-09 IMAGING — XA DG SINUS / FISTULA TRACT / ABSCESSOGRAM
4 series · 10 of 10 positions shown · non-contrast
Comparison: none

INDICATION: Diverticulitis normal status post percutaneous drainage on June 07, 2021

[Series 1: standard · 1 of 1 slices shown (1 of 4)]
[im 1/1]
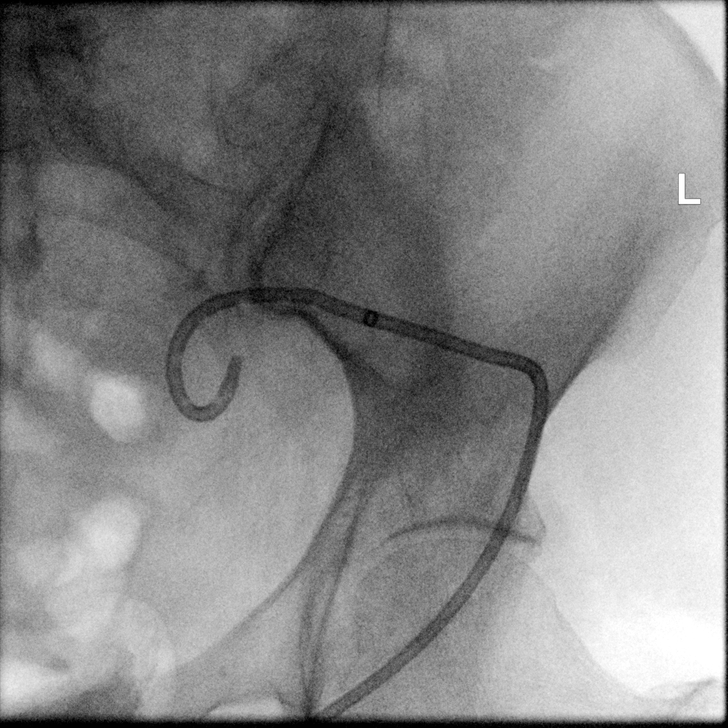

[Series 2: standard · 4 of 56 frames shown (2 of 4)]
[frame 9/56]
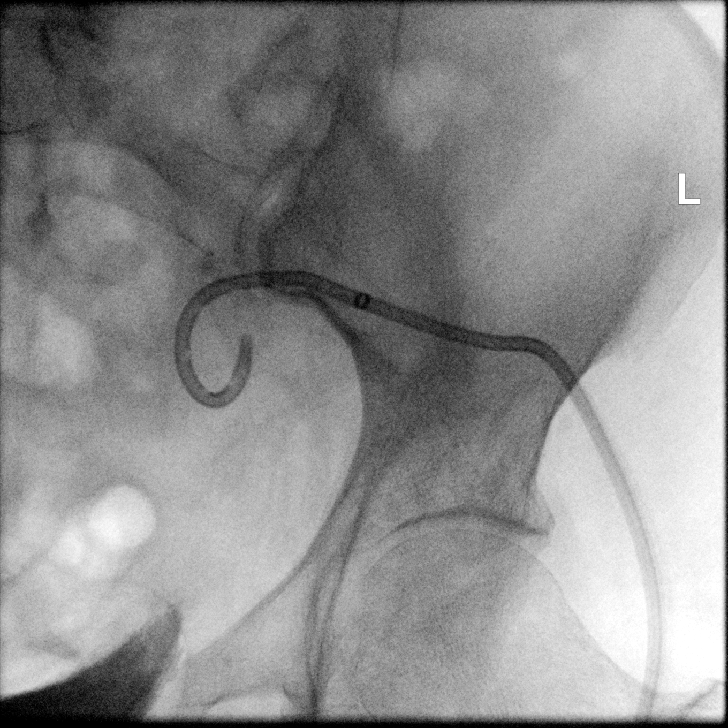
[frame 16/56]
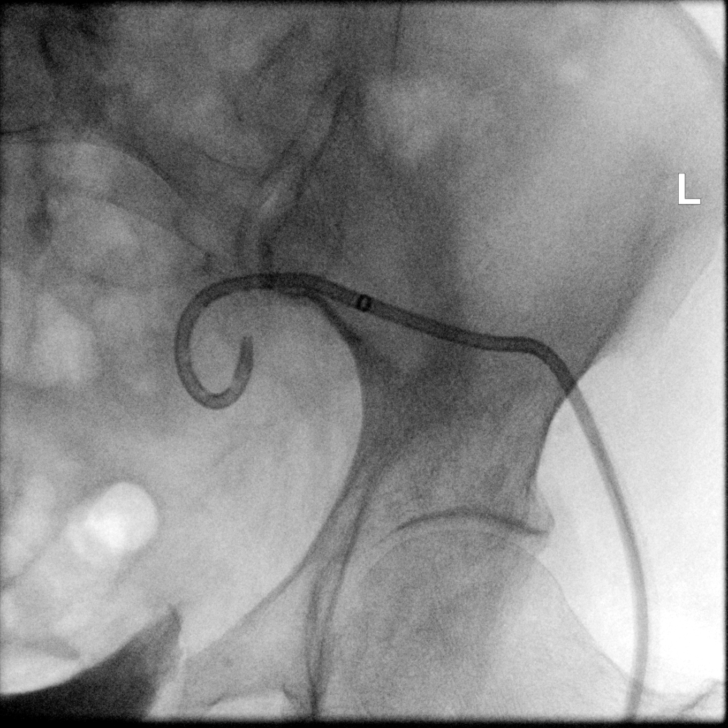
[frame 29/56]
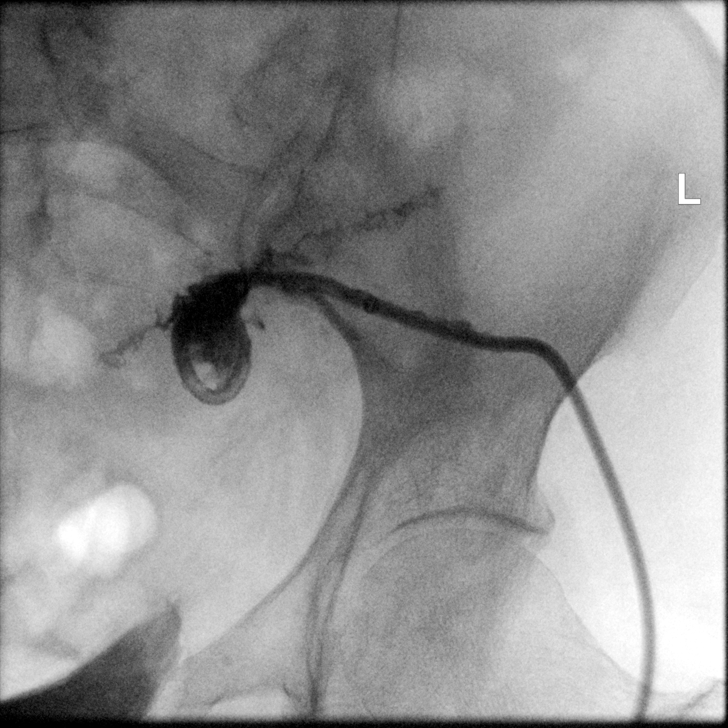
[frame 48/56]
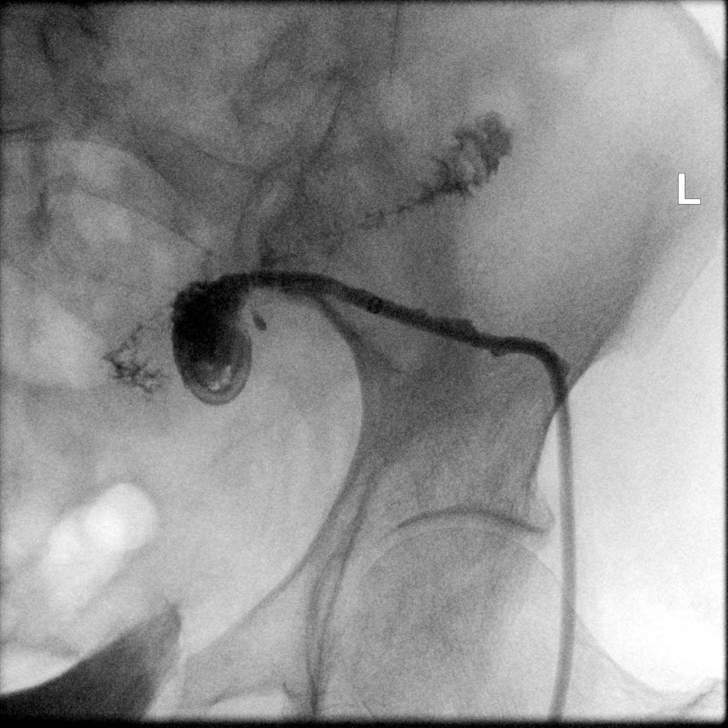

[Series 3: standard · 4 of 41 frames shown (3 of 4)]
[frame 5/41]
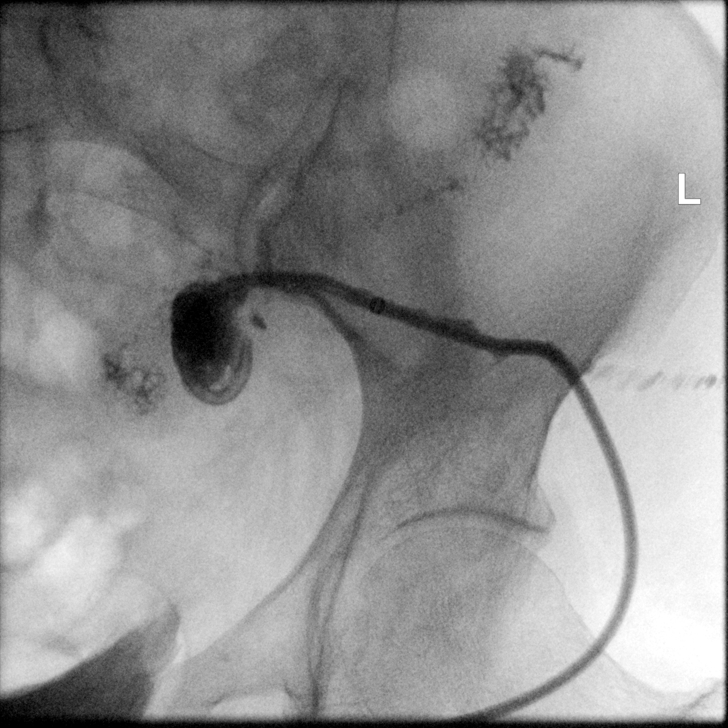
[frame 7/41]
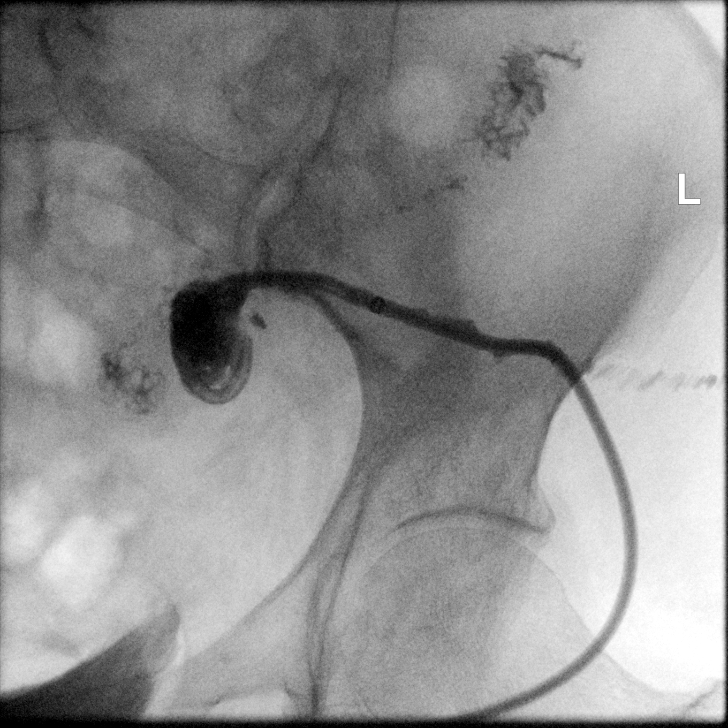
[frame 21/41]
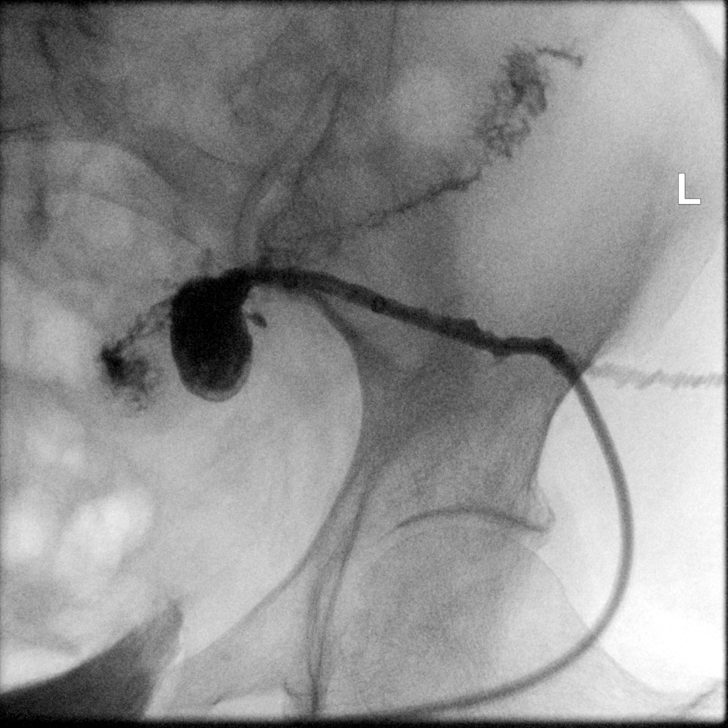
[frame 35/41]
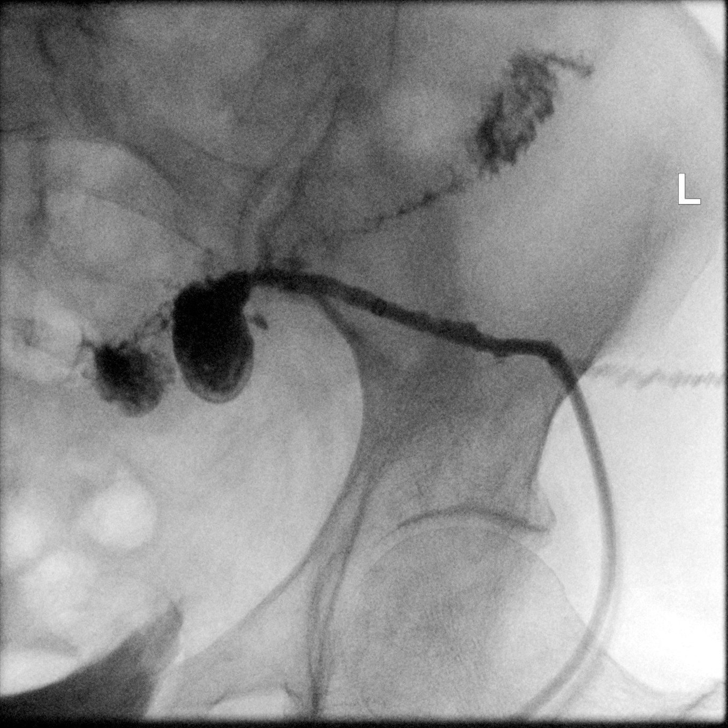

[Series 4: standard · 1 of 1 slices shown (4 of 4)]
[im 1/1]
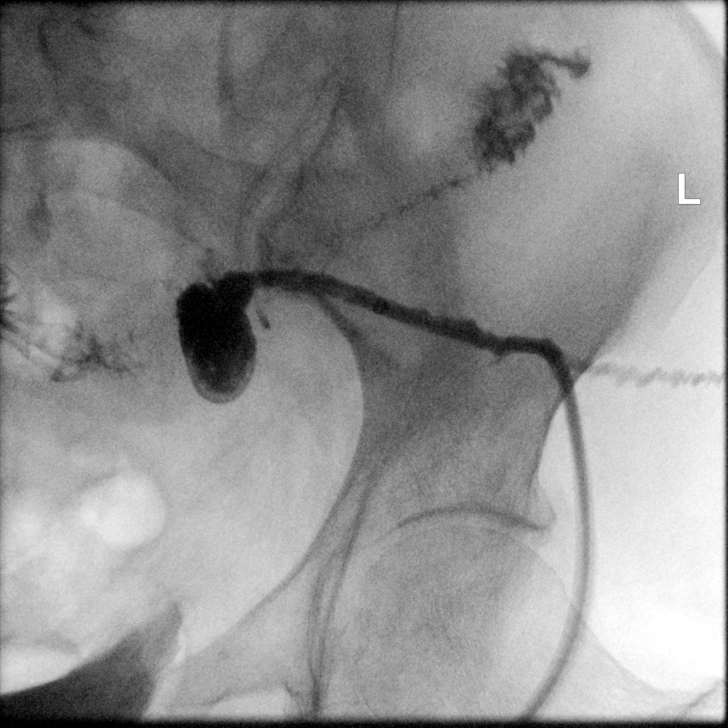

[10 of 10 positions shown; findings below may reference images not displayed]

EXAM:
Drainage catheter injection using fluoroscopy

MEDICATIONS:
None

ANESTHESIA/SEDATION:
None

COMPLICATIONS:
None immediate.

PROCEDURE:
Informed written consent was obtained from the patient after a
thorough discussion of the procedural risks, benefits and
alternatives. All questions were addressed. Maximal Sterile Barrier
Technique was utilized including caps, mask, sterile gowns, sterile
gloves, sterile drape, hand hygiene and skin antiseptic. A timeout
was performed prior to the initiation of the procedure.

The patient was placed supine on the exam table. Injection of the
existing drainage catheter in the left lower quadrant was performed
using fluoroscopy. The drainage catheter is slightly retracted.
Images demonstrate a decompressed abscess cavity. There is prompt
passage of contrast material into the adjacent sigmoid colon. There
is some reflux of contrast material along the percutaneous drainage
tract towards the skin entry site.
IMPRESSION: Study of the left lower quadrant drainage catheter demonstrates
decompressed cavity with fistulous connection to the adjacent
sigmoid colon. The drainage catheter is partially retracted. The
patient will be scheduled for drainage catheter repositioning in the
interventional [HOSPITAL]. Follow-up with surgery advised.

## 2024-05-09 IMAGING — CT CT ABD-PELV W/ CM
1 of 2 series · 14 of 32 positions shown, 18 images · IV contrast (agent unspecified)
Comparison: May 2021

CLINICAL DATA: Follow-up drain, history of diverticulitis status
post percutaneous drain placement on June 07, 2021

EXAM:
CT ABDOMEN AND PELVIS WITH CONTRAST
TECHNIQUE: Multidetector CT imaging of the abdomen and pelvis was performed
using the standard protocol following bolus administration of
intravenous contrast.

[Series 6: a/p w/ 5mm · axial · 0.89mm/px · z∈[-443,-103]mm · 14 of 79 slices shown, 18 images]
[im 7/79  soft-tissue]
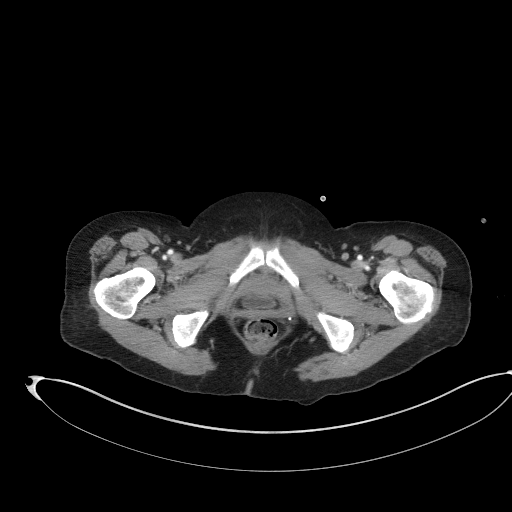
[im 7/79  bone]
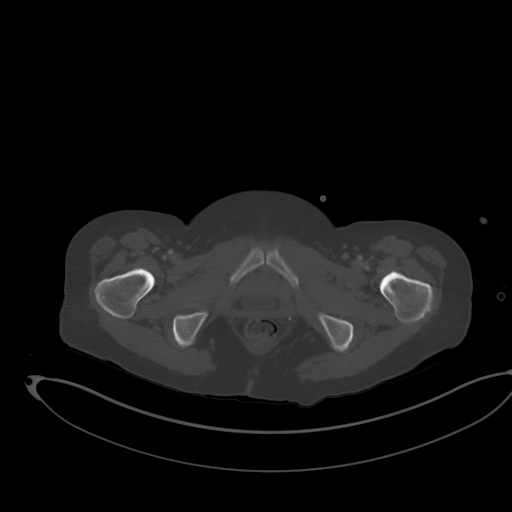
[im 13/79  soft-tissue]
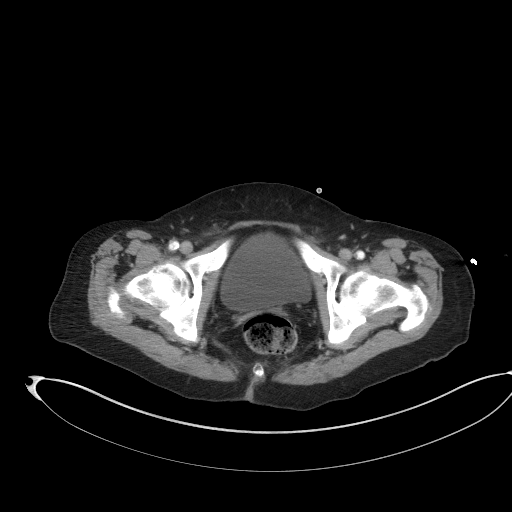
[im 19/79  soft-tissue]
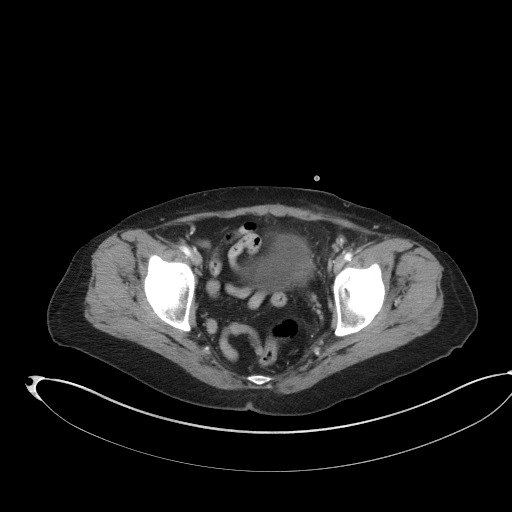
[im 25/79  soft-tissue]
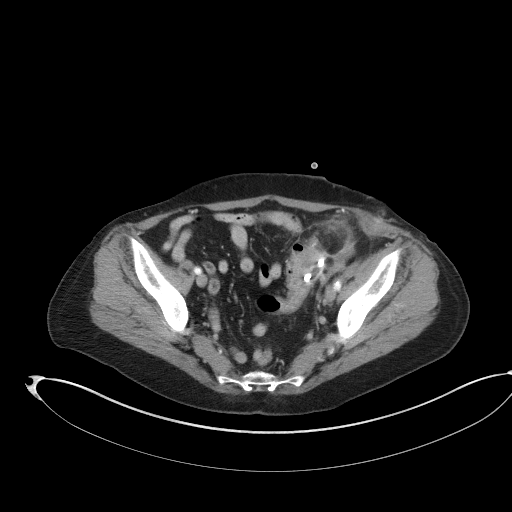
[im 32/79  soft-tissue]
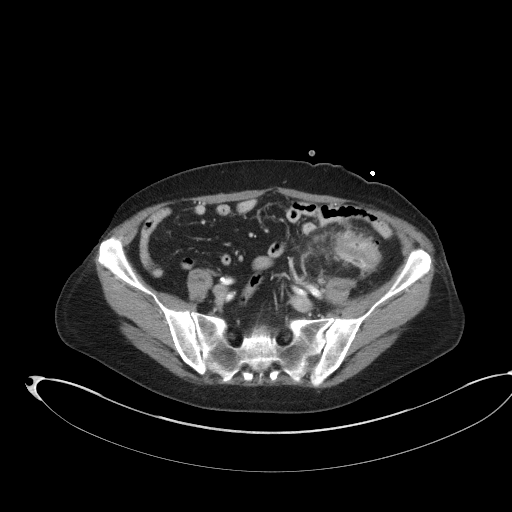
[im 38/79  soft-tissue]
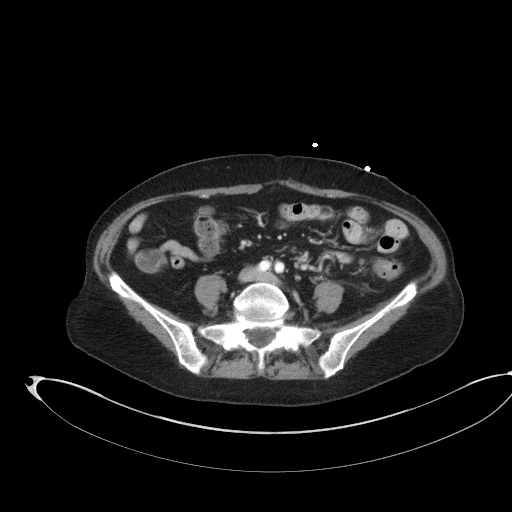
[im 44/79  soft-tissue]
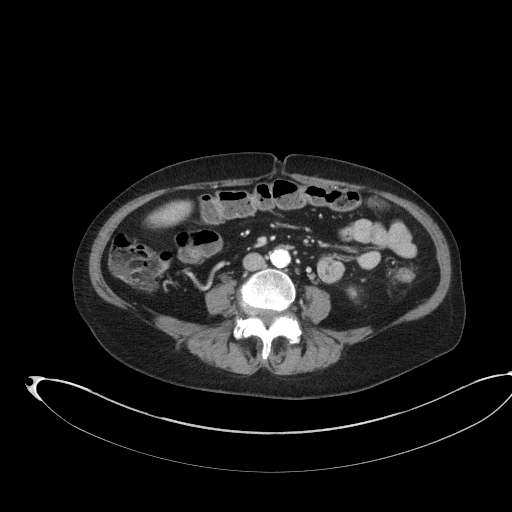
[im 50/79  soft-tissue]
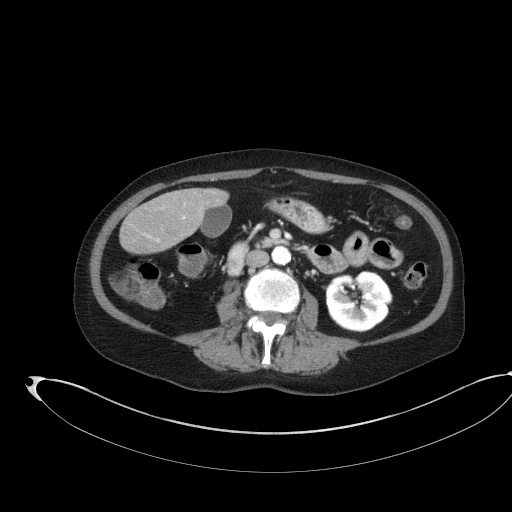
[im 57/79  soft-tissue]
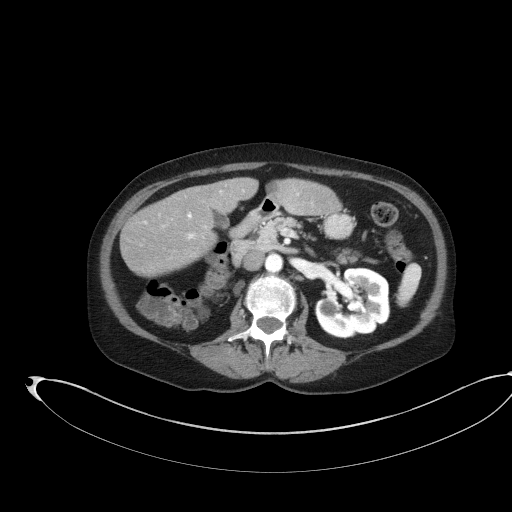
[im 57/79  bone]
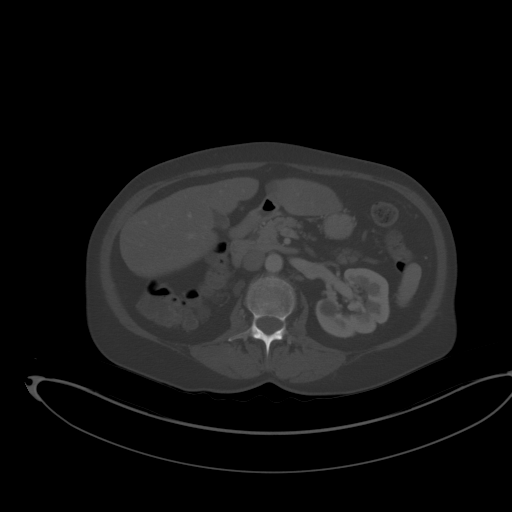
[im 63/79  soft-tissue]
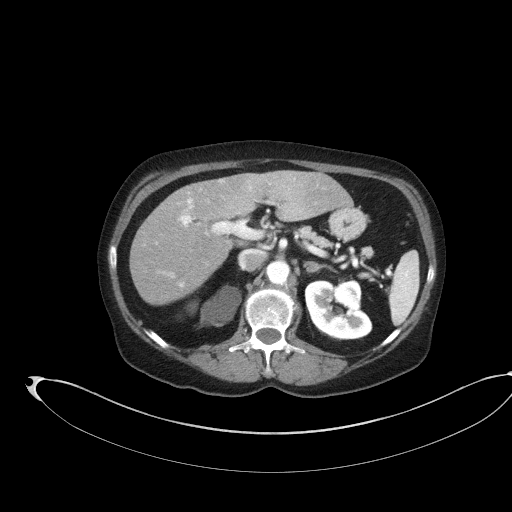
[im 66/79  lung]
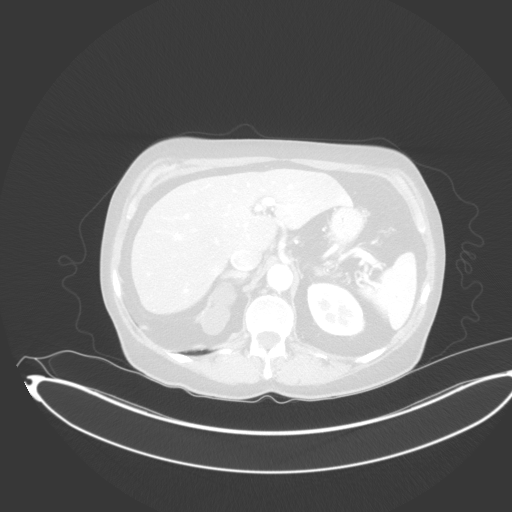
[im 69/79  soft-tissue]
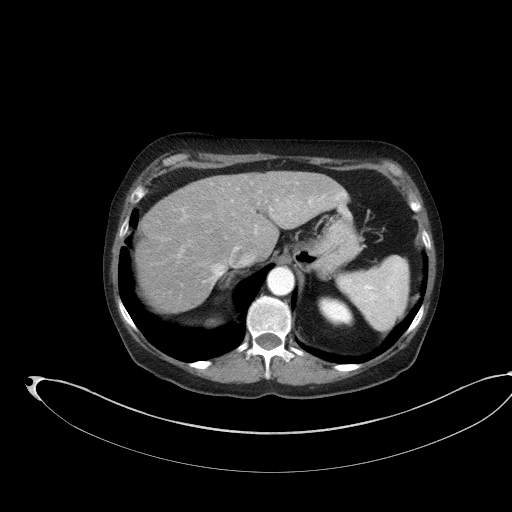
[im 69/79  lung]
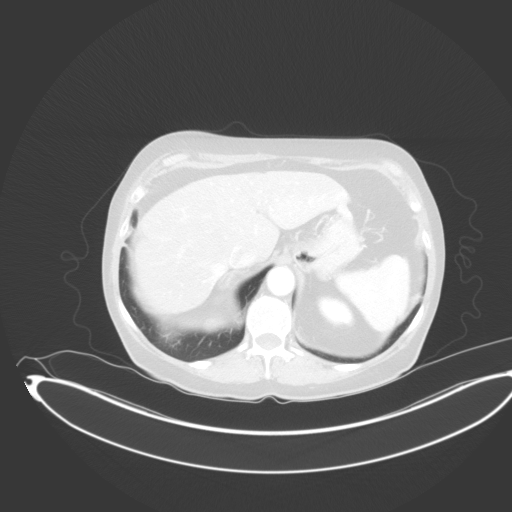
[im 72/79  lung]
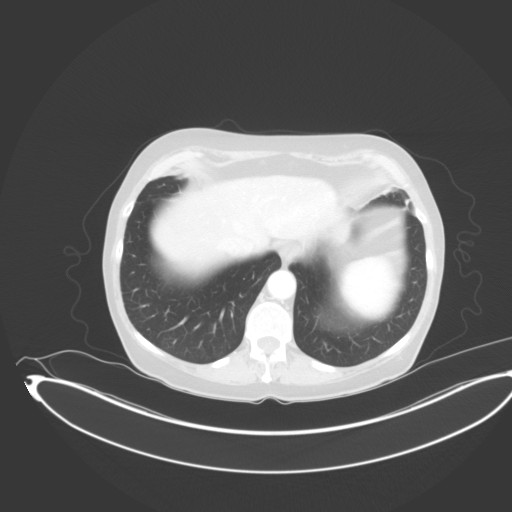
[im 75/79  soft-tissue]
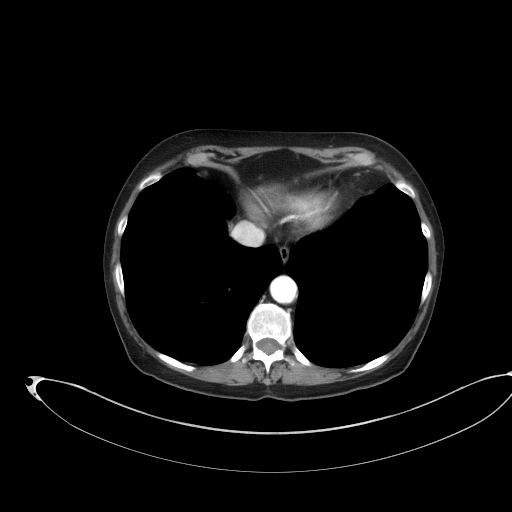
[im 75/79  lung]
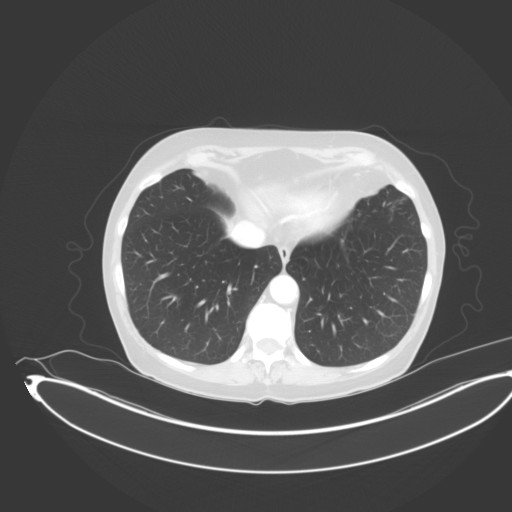

[14 of 32 positions shown; findings below may reference images not displayed]

RADIATION DOSE REDUCTION: This exam was performed according to the
departmental dose-optimization program which includes automated
exposure control, adjustment of the mA and/or kV according to
patient size and/or use of iterative reconstruction technique.

CONTRAST:  80mL 1OEFZE-B7N IOPAMIDOL (1OEFZE-B7N) INJECTION 76%
FINDINGS: Inferior chest: The lung bases are well-aerated.

Hepatobiliary: Hepatic steatosis. No focal liver lesion. No
intrahepatic or extrahepatic biliary ductal dilation. The
gallbladder appears normal.

Spleen: Normal in size without focal abnormality.

Pancreas: No pancreatic ductal dilatation or surrounding
inflammatory changes.

Adrenals/Urinary Tract: Adrenal glands are unremarkable. Similar
markedly atrophic appearance of the right kidney. Similar appearance
of the left kidney with multifocal cortical scarring, no
nephrolithiasis or hydronephrosis. Bladder is unremarkable.

Stomach/Bowel: Right lower quadrant percutaneous drainage catheter
terminates adjacent to the sigmoid colon. Minimal degree of
surrounding fluid and punctate foci of free air, improved from
previous exam. There are residual inflammatory changes of the
proximal sigmoid colon. The stomach and small bowel are normal.

Reproductive: Status post hysterectomy. No adnexal masses.

Lymphatic: No enlarged lymph nodes in the abdomen or pelvis.

Vasculature: The abdominal aorta is normal in caliber. Scattered
atherosclerotic calcifications. The portal venous system is patent.

Other: No abdominopelvic ascites.

Musculoskeletal: No aggressive osseous lesions. The soft tissues are
unremarkable.
IMPRESSION: Status post percutaneous drainage of the left lower quadrant abscess
with small residual fluid and foci of free air. Improved but
residual inflammatory changes of the proximal sigmoid colon.
Attention on forthcoming drain injection.

## 2024-11-14 ENCOUNTER — Ambulatory Visit: Admitting: Urology
